# Patient Record
Sex: Male | Born: 1960 | Race: Black or African American | Hispanic: No | Marital: Single | Smoking: Former smoker
Health system: Southern US, Community
[De-identification: ages and names within clinical notes are randomized; demographics above are authoritative.]

## PROBLEM LIST (undated history)

## (undated) DIAGNOSIS — N200 Calculus of kidney: Secondary | ICD-10-CM

## (undated) DIAGNOSIS — E119 Type 2 diabetes mellitus without complications: Secondary | ICD-10-CM

## (undated) DIAGNOSIS — E785 Hyperlipidemia, unspecified: Secondary | ICD-10-CM

## (undated) DIAGNOSIS — F200 Paranoid schizophrenia: Secondary | ICD-10-CM

## (undated) DIAGNOSIS — I10 Essential (primary) hypertension: Secondary | ICD-10-CM

## (undated) DIAGNOSIS — K219 Gastro-esophageal reflux disease without esophagitis: Secondary | ICD-10-CM

## (undated) DIAGNOSIS — G4730 Sleep apnea, unspecified: Secondary | ICD-10-CM

## (undated) DIAGNOSIS — R569 Unspecified convulsions: Secondary | ICD-10-CM

---

## 2000-05-11 ENCOUNTER — Inpatient Hospital Stay (HOSPITAL_COMMUNITY): Admission: EM | Admit: 2000-05-11 | Discharge: 2000-05-19 | Payer: Self-pay | Admitting: *Deleted

## 2000-05-11 ENCOUNTER — Emergency Department (HOSPITAL_COMMUNITY): Admission: EM | Admit: 2000-05-11 | Discharge: 2000-05-11 | Payer: Self-pay | Admitting: Emergency Medicine

## 2000-05-28 ENCOUNTER — Inpatient Hospital Stay (HOSPITAL_COMMUNITY): Admission: EM | Admit: 2000-05-28 | Discharge: 2000-06-06 | Payer: Self-pay | Admitting: *Deleted

## 2001-10-25 ENCOUNTER — Emergency Department (HOSPITAL_COMMUNITY): Admission: EM | Admit: 2001-10-25 | Discharge: 2001-10-25 | Payer: Self-pay | Admitting: Emergency Medicine

## 2003-08-04 ENCOUNTER — Emergency Department (HOSPITAL_COMMUNITY): Admission: EM | Admit: 2003-08-04 | Discharge: 2003-08-04 | Payer: Self-pay | Admitting: Emergency Medicine

## 2003-09-11 ENCOUNTER — Inpatient Hospital Stay (HOSPITAL_COMMUNITY): Admission: AD | Admit: 2003-09-11 | Discharge: 2003-09-19 | Payer: Self-pay | Admitting: Psychiatry

## 2014-11-07 ENCOUNTER — Emergency Department (HOSPITAL_COMMUNITY)
Admission: EM | Admit: 2014-11-07 | Discharge: 2014-11-07 | Disposition: A | Discharge status: Home / Self Care | Payer: Medicaid Other | Attending: Emergency Medicine | Admitting: Emergency Medicine

## 2014-11-07 ENCOUNTER — Encounter (HOSPITAL_COMMUNITY): Payer: Self-pay | Admitting: *Deleted

## 2014-11-07 DIAGNOSIS — Z79899 Other long term (current) drug therapy: Secondary | ICD-10-CM | POA: Diagnosis not present

## 2014-11-07 DIAGNOSIS — Z87442 Personal history of urinary calculi: Secondary | ICD-10-CM | POA: Diagnosis not present

## 2014-11-07 DIAGNOSIS — R197 Diarrhea, unspecified: Secondary | ICD-10-CM | POA: Diagnosis not present

## 2014-11-07 DIAGNOSIS — E119 Type 2 diabetes mellitus without complications: Secondary | ICD-10-CM | POA: Insufficient documentation

## 2014-11-07 DIAGNOSIS — R7989 Other specified abnormal findings of blood chemistry: Secondary | ICD-10-CM | POA: Diagnosis present

## 2014-11-07 DIAGNOSIS — K219 Gastro-esophageal reflux disease without esophagitis: Secondary | ICD-10-CM | POA: Diagnosis not present

## 2014-11-07 DIAGNOSIS — Z720 Tobacco use: Secondary | ICD-10-CM | POA: Diagnosis not present

## 2014-11-07 DIAGNOSIS — F209 Schizophrenia, unspecified: Secondary | ICD-10-CM | POA: Insufficient documentation

## 2014-11-07 DIAGNOSIS — E785 Hyperlipidemia, unspecified: Secondary | ICD-10-CM | POA: Insufficient documentation

## 2014-11-07 DIAGNOSIS — I10 Essential (primary) hypertension: Secondary | ICD-10-CM | POA: Insufficient documentation

## 2014-11-07 DIAGNOSIS — Z880 Allergy status to penicillin: Secondary | ICD-10-CM | POA: Diagnosis not present

## 2014-11-07 HISTORY — DX: Type 2 diabetes mellitus without complications: E11.9

## 2014-11-07 HISTORY — DX: Calculus of kidney: N20.0

## 2014-11-07 HISTORY — DX: Gastro-esophageal reflux disease without esophagitis: K21.9

## 2014-11-07 HISTORY — DX: Essential (primary) hypertension: I10

## 2014-11-07 HISTORY — DX: Paranoid schizophrenia: F20.0

## 2014-11-07 HISTORY — DX: Sleep apnea, unspecified: G47.30

## 2014-11-07 HISTORY — DX: Hyperlipidemia, unspecified: E78.5

## 2014-11-07 LAB — COMPREHENSIVE METABOLIC PANEL
ALT: 21 U/L (ref 17–63)
ANION GAP: 9 (ref 5–15)
AST: 28 U/L (ref 15–41)
Albumin: 3.5 g/dL (ref 3.5–5.0)
Alkaline Phosphatase: 74 U/L (ref 38–126)
BUN: 18 mg/dL (ref 6–20)
CALCIUM: 9.2 mg/dL (ref 8.9–10.3)
CHLORIDE: 106 mmol/L (ref 101–111)
CO2: 25 mmol/L (ref 22–32)
Creatinine, Ser: 1.18 mg/dL (ref 0.61–1.24)
Glucose, Bld: 86 mg/dL (ref 65–99)
POTASSIUM: 4.4 mmol/L (ref 3.5–5.1)
Sodium: 140 mmol/L (ref 135–145)
Total Bilirubin: 0.5 mg/dL (ref 0.3–1.2)
Total Protein: 6.9 g/dL (ref 6.5–8.1)

## 2014-11-07 LAB — CBC
HEMATOCRIT: 37.7 % — AB (ref 39.0–52.0)
Hemoglobin: 12.4 g/dL — ABNORMAL LOW (ref 13.0–17.0)
MCH: 26.4 pg (ref 26.0–34.0)
MCHC: 32.9 g/dL (ref 30.0–36.0)
MCV: 80.4 fL (ref 78.0–100.0)
PLATELETS: 299 10*3/uL (ref 150–400)
RBC: 4.69 MIL/uL (ref 4.22–5.81)
RDW: 15.3 % (ref 11.5–15.5)
WBC: 10.7 10*3/uL — AB (ref 4.0–10.5)

## 2014-11-07 NOTE — ED Notes (Addendum)
Per ems pt is from [facility], pt has blood work drawn on 6/9, pt had elevated potassium 5.8. Denies any complaint. Reports chronic diarrhea and rectum irritated due to diarrhea. Denies SOB. Rhonchi lung sounds. cig smoker 1 pack/day. Denies ETOH.   Upon rn assessment. Pt reports rectal pain from diarrhea 8/10.

## 2014-11-07 NOTE — Discharge Instructions (Signed)
Your electrolytes and kidney function were normal today.  Please follow up with your family doctor regarding the diarrhea and for recheck.    Diarrhea Diarrhea is frequent loose and watery bowel movements. It can cause you to feel weak and dehydrated. Dehydration can cause you to become tired and thirsty, have a dry mouth, and have decreased urination that often is dark yellow. Diarrhea is a sign of another problem, most often an infection that will not last long. In most cases, diarrhea typically lasts 2-3 days. However, it can last longer if it is a sign of something more serious. It is important to treat your diarrhea as directed by your caregiver to lessen or prevent future episodes of diarrhea. CAUSES  Some common causes include:  Gastrointestinal infections caused by viruses, bacteria, or parasites.  Food poisoning or food allergies.  Certain medicines, such as antibiotics, chemotherapy, and laxatives.  Artificial sweeteners and fructose.  Digestive disorders. HOME CARE INSTRUCTIONS  Ensure adequate fluid intake (hydration): Have 1 cup (8 oz) of fluid for each diarrhea episode. Avoid fluids that contain simple sugars or sports drinks, fruit juices, whole milk products, and sodas. Your urine should be clear or pale yellow if you are drinking enough fluids. Hydrate with an oral rehydration solution that you can purchase at pharmacies, retail stores, and online. You can prepare an oral rehydration solution at home by mixing the following ingredients together:   - tsp table salt.   tsp baking soda.   tsp salt substitute containing potassium chloride.  1  tablespoons sugar.  1 L (34 oz) of water.  Certain foods and beverages may increase the speed at which food moves through the gastrointestinal (GI) tract. These foods and beverages should be avoided and include:  Caffeinated and alcoholic beverages.  High-fiber foods, such as raw fruits and vegetables, nuts, seeds, and whole  grain breads and cereals.  Foods and beverages sweetened with sugar alcohols, such as xylitol, sorbitol, and mannitol.  Some foods may be well tolerated and may help thicken stool including:  Starchy foods, such as rice, toast, pasta, low-sugar cereal, oatmeal, grits, baked potatoes, crackers, and bagels.  Bananas.  Applesauce.  Add probiotic-rich foods to help increase healthy bacteria in the GI tract, such as yogurt and fermented milk products.  Wash your hands well after each diarrhea episode.  Only take over-the-counter or prescription medicines as directed by your caregiver.  Take a warm bath to relieve any burning or pain from frequent diarrhea episodes. SEEK IMMEDIATE MEDICAL CARE IF:   You are unable to keep fluids down.  You have persistent vomiting.  You have blood in your stool, or your stools are black and tarry.  You do not urinate in 6-8 hours, or there is only a small amount of very dark urine.  You have abdominal pain that increases or localizes.  You have weakness, dizziness, confusion, or light-headedness.  You have a severe headache.  Your diarrhea gets worse or does not get better.  You have a fever or persistent symptoms for more than 2-3 days.  You have a fever and your symptoms suddenly get worse. MAKE SURE YOU:   Understand these instructions.  Will watch your condition.  Will get help right away if you are not doing well or get worse. Document Released: 04/30/2002 Document Revised: 09/24/2013 Document Reviewed: 01/16/2012 [facility] Patient Information 2015 [place], [place]. This information is not intended to replace advice given to you by your health care provider. Make sure you discuss any  questions you have with your health care provider. ° °

## 2014-11-07 NOTE — ED Notes (Signed)
PTAR at bedside 

## 2014-11-07 NOTE — ED Notes (Signed)
[facility] called for transport back to facility 

## 2014-11-07 NOTE — Progress Notes (Signed)
CM saw pt prior to [facility] Pt initially stated to cm that he was living "in the psych ward"  Pt residing in [place] but states he is originally from "[place]" CM discuss with pt that is he at [facility] and the dr from his facility forms states he if followed by Dr Herma Mering Pt able to repeat this to Cm  Cm explained to pt that if he left [facility] he needed to contact DSS to get assist with a medicaid pcp Cm provided pt with a list of [place] pcps and pt states he understood The contact number and address is on the info provided by Cm

## 2014-11-07 NOTE — ED Provider Notes (Signed)
CSN: [id number]     Arrival date & time 11/07/14  0910 History   First MD Initiated Contact with Patient 11/07/14 [phone]     Chief Complaint  Patient presents with  . Abnormal Lab     The history is provided by the patient and the EMS personnel. No language interpreter was used.   Ronald Roman presents for evaluation of abnormal lab. Level 5 caveat due to vague and poor historian. Per report patient is referred for evaluation of abnormal labs. He had labs drawn in June 8 that were reported on June 9. The potassium was elevated at 5.9 on those labs. His creatinine was elevated at 1.4. Patient has no current complaints but states that he vomited once yesterday and he has had some intermittent lower abdominal pain and rectal pain as well as a large amount of orange diarrhea. It is unclear how long the diarrhea has been taking place. He resides at [facility]. He has a history of paranoid schizophrenia, diabetes.  Past Medical History  Diagnosis Date  . Diabetes mellitus without complication   . Hyperlipidemia   . Hypertension   . GERD (gastroesophageal reflux disease)   . Uric acid nephrolithiasis   . Sleep apnea   . Paranoid schizophrenia    History reviewed. No pertinent past surgical history. History reviewed. No pertinent family history. History  Substance Use Topics  . Smoking status: Current Every Day Smoker -- 1.00 packs/day    Types: Cigarettes  . Smokeless tobacco: Not on file  . Alcohol Use: No    Review of Systems  All other systems reviewed and are negative.     Allergies  Cogentin; Penicillins; and Prolixin  Home Medications   Prior to Admission medications   Medication Sig Start Date End Date Taking? Authorizing Provider  albuterol (PROVENTIL HFA;VENTOLIN HFA) 108 (90 BASE) MCG/ACT inhaler Inhale 2 puffs into the lungs every 4 (four) hours as needed for shortness of breath.   Yes Historical Provider, MD  atorvastatin (LIPITOR) 40 MG tablet Take 40 mg by mouth  daily with breakfast.   Yes Historical Provider, MD  cloZAPine (CLOZARIL) 100 MG tablet Take 200 mg by mouth 2 (two) times daily.   Yes Historical Provider, MD  divalproex (DEPAKOTE) 250 MG DR tablet Take 250 mg by mouth at bedtime.   Yes Historical Provider, MD  divalproex (DEPAKOTE) 500 MG DR tablet Take 1,000 mg by mouth at bedtime.   Yes Historical Provider, MD  hydrochlorothiazide (HYDRODIURIL) 25 MG tablet Take 12.5 mg by mouth daily with breakfast.   Yes Historical Provider, MD  insulin glargine (LANTUS) 100 UNIT/ML injection Inject 30 Units into the skin 2 (two) times daily. Hold bedtime dose if BS is 100 or lower   Yes Historical Provider, MD  lisinopril (PRINIVIL,ZESTRIL) 10 MG tablet Take 20 mg by mouth daily with breakfast.   Yes Historical Provider, MD  LORazepam (ATIVAN) 0.5 MG tablet Take 0.5 mg by mouth 2 (two) times daily.   Yes Historical Provider, MD  magnesium oxide (MAG-OX) 400 MG tablet Take 400 mg by mouth 2 (two) times daily.   Yes Historical Provider, MD  metFORMIN (GLUCOPHAGE) 1000 MG tablet Take 1,000 mg by mouth 2 (two) times daily with a meal.   Yes Historical Provider, MD  metoprolol tartrate (LOPRESSOR) 25 MG tablet Take 25 mg by mouth 2 (two) times daily.   Yes Historical Provider, MD  QUEtiapine (SEROQUEL) 100 MG tablet Take 100 mg by mouth 2 (two) times daily.  Yes Historical Provider, MD  QUEtiapine (SEROQUEL) 50 MG tablet Take 75 mg by mouth 2 (two) times daily.   Yes Historical Provider, MD  ranitidine (ZANTAC) 150 MG tablet Take 150 mg by mouth daily with breakfast.   Yes Historical Provider, MD  senna-docusate (SENEXON-S) 8.6-50 MG per tablet Take 1 tablet by mouth 2 (two) times daily.   Yes Historical Provider, MD  sitaGLIPtin (JANUVIA) 100 MG tablet Take 100 mg by mouth daily with breakfast.   Yes Historical Provider, MD  tiotropium (SPIRIVA) 18 MCG inhalation capsule Place 18 mcg into inhaler and inhale daily.   Yes Historical Provider, MD  verapamil  (CALAN) 120 MG tablet Take 120 mg by mouth at bedtime.   Yes Historical Provider, MD   BP 125/82 mmHg  Pulse 84  Temp(Src) 98.5 F (36.9 C) (Oral)  Resp 16  SpO2 97% Physical Exam  Constitutional: He appears well-developed and well-nourished.  HENT:  Head: Normocephalic and atraumatic.  Cardiovascular: Normal rate and regular rhythm.   Pulmonary/Chest: Effort normal. No respiratory distress.  Abdominal: Soft. There is no tenderness. There is no rebound and no guarding.  Genitourinary:  Minimal rectal tenderness, no gross blood, no external hemorrhoids or palpable masses.  Musculoskeletal: He exhibits no edema or tenderness.  Neurological: He is alert.  Skin: Skin is warm and dry.  Psychiatric:  Flat affect  Nursing note and vitals reviewed.   ED Course  Procedures (including critical care time) Labs Review Labs Reviewed  CBC - Abnormal; Notable for the following:    WBC 10.7 (*)    Hemoglobin 12.4 (*)    HCT 37.7 (*)    All other components within normal limits  COMPREHENSIVE METABOLIC PANEL  COMPREHENSIVE METABOLIC PANEL    Imaging Review No results found.   EKG Interpretation None      MDM   Final diagnoses:  Diarrhea    Patient with ongoing diarrhea here for evaluation of hyperkalemia from a lab drawn one week ago. There is no evidence of hyperkalemia today. Abdominal examination is benign and patient is without any current complaints. Discussed continuing current medications and outpatient follow-up.    Tilden Fossa, MD 11/08/14 [phone]

## 2014-11-07 NOTE — ED Notes (Signed)
Bed: NK53 Expected date: 11/07/14 Expected time:  Means of arrival:  Comments: EMS

## 2014-11-07 NOTE — ED Notes (Signed)
md at bedside  Pt alert and oriented x4. Respirations even and unlabored, bilateral symmetrical rise and fall of chest. Skin warm and dry. In no acute distress. Denies needs.   

## 2015-02-03 ENCOUNTER — Encounter (HOSPITAL_COMMUNITY): Payer: Self-pay

## 2015-02-03 ENCOUNTER — Inpatient Hospital Stay (HOSPITAL_COMMUNITY)
Admission: EM | Admit: 2015-02-03 | Discharge: 2015-02-07 | DRG: 092 | Disposition: A | Discharge status: Home Health | Payer: Medicaid Other | Attending: Internal Medicine | Admitting: Internal Medicine

## 2015-02-03 DIAGNOSIS — G9340 Encephalopathy, unspecified: Secondary | ICD-10-CM | POA: Diagnosis present

## 2015-02-03 DIAGNOSIS — M6282 Rhabdomyolysis: Secondary | ICD-10-CM | POA: Diagnosis present

## 2015-02-03 DIAGNOSIS — T424X5A Adverse effect of benzodiazepines, initial encounter: Secondary | ICD-10-CM | POA: Diagnosis present

## 2015-02-03 DIAGNOSIS — K219 Gastro-esophageal reflux disease without esophagitis: Secondary | ICD-10-CM | POA: Diagnosis present

## 2015-02-03 DIAGNOSIS — E785 Hyperlipidemia, unspecified: Secondary | ICD-10-CM | POA: Diagnosis present

## 2015-02-03 DIAGNOSIS — R569 Unspecified convulsions: Secondary | ICD-10-CM

## 2015-02-03 DIAGNOSIS — J029 Acute pharyngitis, unspecified: Secondary | ICD-10-CM | POA: Diagnosis present

## 2015-02-03 DIAGNOSIS — F17210 Nicotine dependence, cigarettes, uncomplicated: Secondary | ICD-10-CM | POA: Diagnosis present

## 2015-02-03 DIAGNOSIS — G009 Bacterial meningitis, unspecified: Secondary | ICD-10-CM | POA: Diagnosis present

## 2015-02-03 DIAGNOSIS — D72829 Elevated white blood cell count, unspecified: Secondary | ICD-10-CM | POA: Diagnosis present

## 2015-02-03 DIAGNOSIS — T43225A Adverse effect of selective serotonin reuptake inhibitors, initial encounter: Secondary | ICD-10-CM | POA: Diagnosis present

## 2015-02-03 DIAGNOSIS — Z87442 Personal history of urinary calculi: Secondary | ICD-10-CM

## 2015-02-03 DIAGNOSIS — R40241 Glasgow coma scale score 13-15, unspecified time: Secondary | ICD-10-CM

## 2015-02-03 DIAGNOSIS — G40909 Epilepsy, unspecified, not intractable, without status epilepticus: Secondary | ICD-10-CM

## 2015-02-03 DIAGNOSIS — G4733 Obstructive sleep apnea (adult) (pediatric): Secondary | ICD-10-CM | POA: Diagnosis present

## 2015-02-03 DIAGNOSIS — I10 Essential (primary) hypertension: Secondary | ICD-10-CM | POA: Diagnosis present

## 2015-02-03 DIAGNOSIS — G92 Toxic encephalopathy: Principal | ICD-10-CM | POA: Diagnosis present

## 2015-02-03 DIAGNOSIS — Y92009 Unspecified place in unspecified non-institutional (private) residence as the place of occurrence of the external cause: Secondary | ICD-10-CM

## 2015-02-03 DIAGNOSIS — E872 Acidosis, unspecified: Secondary | ICD-10-CM

## 2015-02-03 DIAGNOSIS — Z888 Allergy status to other drugs, medicaments and biological substances status: Secondary | ICD-10-CM

## 2015-02-03 DIAGNOSIS — E1165 Type 2 diabetes mellitus with hyperglycemia: Secondary | ICD-10-CM | POA: Diagnosis present

## 2015-02-03 DIAGNOSIS — Z794 Long term (current) use of insulin: Secondary | ICD-10-CM

## 2015-02-03 DIAGNOSIS — Z880 Allergy status to penicillin: Secondary | ICD-10-CM

## 2015-02-03 DIAGNOSIS — Z720 Tobacco use: Secondary | ICD-10-CM | POA: Diagnosis present

## 2015-02-03 DIAGNOSIS — Z79899 Other long term (current) drug therapy: Secondary | ICD-10-CM

## 2015-02-03 DIAGNOSIS — G4730 Sleep apnea, unspecified: Secondary | ICD-10-CM | POA: Diagnosis present

## 2015-02-03 DIAGNOSIS — F200 Paranoid schizophrenia: Secondary | ICD-10-CM | POA: Diagnosis present

## 2015-02-03 DIAGNOSIS — E119 Type 2 diabetes mellitus without complications: Secondary | ICD-10-CM

## 2015-02-03 DIAGNOSIS — N179 Acute kidney failure, unspecified: Secondary | ICD-10-CM | POA: Diagnosis present

## 2015-02-03 MED ORDER — SODIUM CHLORIDE 0.9 % IV BOLUS (SEPSIS)
1000.0000 mL | Freq: Once | INTRAVENOUS | Status: AC
  Administered 2015-02-04: 1000 mL via INTRAVENOUS

## 2015-02-03 NOTE — ED Provider Notes (Signed)
CSN: [id number]     Arrival date & time 02/03/15  2159 History  This chart was scribed for Derwood Kaplan, MD by Tanda Rockers, ED Scribe. This patient was seen in room WA16/WA16 and the patient's care was started at 11:16 PM.   Chief Complaint  Patient presents with  . Altered Mental Status   LEVEL 5 CAVEAT for altered mental status  The history is provided by the patient. No language interpreter was used.     HPI Comments: Ronald Roman is a 54 y.o. male with h DM, HLD, HTN, paranoid schizophrenia who presents to the Emergency Department complaining of AMS. Pt does not know why he is in the hospital tonight. When asked what the last thing patient remembers is he says he "remembers little things." Pt knows what his name is but reports the year is "[birth year]." He denies EtOH or illicit drug use today. Per triage report, EMS was called on pt tonight due to independent living facility being unable to locate pt. He was found laying in the porch and was incontinence of stool and urine.   Past Medical History  Diagnosis Date  . Diabetes mellitus without complication   . Hyperlipidemia   . Hypertension   . GERD (gastroesophageal reflux disease)   . Uric acid nephrolithiasis   . Sleep apnea   . Paranoid schizophrenia    History reviewed. No pertinent past surgical history. History reviewed. No pertinent family history. Social History  Substance Use Topics  . Smoking status: Current Every Day Smoker -- 1.00 packs/day    Types: Cigarettes  . Smokeless tobacco: None  . Alcohol Use: No    Review of Systems  Unable to perform ROS: Mental status change   Allergies  Cogentin; Penicillins; and Prolixin  Home Medications   Prior to Admission medications   Medication Sig Start Date End Date Taking? Authorizing Provider  albuterol (PROVENTIL HFA;VENTOLIN HFA) 108 (90 BASE) MCG/ACT inhaler Inhale 2 puffs into the lungs every 4 (four) hours as needed for shortness of breath.    Historical  Provider, MD  atorvastatin (LIPITOR) 40 MG tablet Take 40 mg by mouth daily with breakfast.    Historical Provider, MD  cloZAPine (CLOZARIL) 100 MG tablet Take 200 mg by mouth 2 (two) times daily.    Historical Provider, MD  divalproex (DEPAKOTE) 250 MG DR tablet Take 250 mg by mouth at bedtime.    Historical Provider, MD  divalproex (DEPAKOTE) 500 MG DR tablet Take 1,000 mg by mouth at bedtime.    Historical Provider, MD  hydrochlorothiazide (HYDRODIURIL) 25 MG tablet Take 12.5 mg by mouth daily with breakfast.    Historical Provider, MD  insulin glargine (LANTUS) 100 UNIT/ML injection Inject 30 Units into the skin 2 (two) times daily. Hold bedtime dose if BS is 100 or lower    Historical Provider, MD  lisinopril (PRINIVIL,ZESTRIL) 10 MG tablet Take 20 mg by mouth daily with breakfast.    Historical Provider, MD  LORazepam (ATIVAN) 0.5 MG tablet Take 0.5 mg by mouth 2 (two) times daily.    Historical Provider, MD  magnesium oxide (MAG-OX) 400 MG tablet Take 400 mg by mouth 2 (two) times daily.    Historical Provider, MD  metFORMIN (GLUCOPHAGE) 1000 MG tablet Take 1,000 mg by mouth 2 (two) times daily with a meal.    Historical Provider, MD  metoprolol tartrate (LOPRESSOR) 25 MG tablet Take 25 mg by mouth 2 (two) times daily.    Historical Provider, MD  QUEtiapine (  SEROQUEL) 100 MG tablet Take 100 mg by mouth 2 (two) times daily.    Historical Provider, MD  QUEtiapine (SEROQUEL) 50 MG tablet Take 75 mg by mouth 2 (two) times daily.    Historical Provider, MD  ranitidine (ZANTAC) 150 MG tablet Take 150 mg by mouth daily with breakfast.    Historical Provider, MD  senna-docusate (SENEXON-S) 8.6-50 MG per tablet Take 1 tablet by mouth 2 (two) times daily.    Historical Provider, MD  sitaGLIPtin (JANUVIA) 100 MG tablet Take 100 mg by mouth daily with breakfast.    Historical Provider, MD  tiotropium (SPIRIVA) 18 MCG inhalation capsule Place 18 mcg into inhaler and inhale daily.    Historical Provider,  MD  verapamil (CALAN) 120 MG tablet Take 120 mg by mouth at bedtime.    Historical Provider, MD   Triage Vitals: BP 113/57 mmHg  Pulse 105  Temp(Src) 98.3 F (36.8 C) (Oral)  Resp 18  SpO2 95%   Physical Exam  Constitutional: He appears well-developed and well-nourished. No distress.  HENT:  Head: Normocephalic and atraumatic.  Eyes: Conjunctivae and EOM are normal.  Pupils are 2 mm. Reactive to light.   Neck: Neck supple. No tracheal deviation present.  Cardiovascular: Regular rhythm.  Tachycardia present.   Pulmonary/Chest: Effort normal. No respiratory distress.  Abdominal: Soft. There is no tenderness.  Musculoskeletal: Normal range of motion.  Neurological: He is alert.  Pt following simple commands CN I-XII intact Moving all extremities   Skin: Skin is warm and dry.  Nursing note and vitals reviewed.   ED Course  LUMBAR PUNCTURE Date/Time: 02/04/2015 6:10 AM Performed by: Derwood Kaplan Authorized by: Derwood Kaplan Consent: The procedure was performed in an emergent situation. Verbal consent obtained. Risks and benefits: risks, benefits and alternatives were discussed Consent given by: patient (case manager) Patient understanding: patient states understanding of the procedure being performed Site marked: the operative site was marked Patient identity confirmed: arm band Indications: evaluation for infection and evaluation for altered mental status Anesthesia: local infiltration Local anesthetic: lidocaine 1% with epinephrine Anesthetic total: 3 ml Patient sedated: no Lumbar space: L4-L5 interspace Patient's position: sitting Needle gauge: 20 Needle type: diamond point Fluid appearance: clear Tubes of fluid: 4 Total volume: 10 ml Post-procedure: site cleaned and adhesive bandage applied Patient tolerance: Patient tolerated the procedure well with no immediate complications   (including critical care time)    DIAGNOSTIC STUDIES: Oxygen Saturation is  95% on RA, normal by my interpretation.    COORDINATION OF CARE: 11:42 PM-Treatment plan includes CT Head, Lactic acid, CBC, CMP, Troponin, Magnesium, Phosphorous, Rapid drug screen, ETOH, Valproic acid, Ammonia    Labs Review Labs Reviewed  CBC WITH DIFFERENTIAL/PLATELET - Abnormal; Notable for the following:    WBC 18.2 (*)    HCT 38.8 (*)    Neutro Abs 13.0 (*)    Monocytes Absolute 1.3 (*)    All other components within normal limits  COMPREHENSIVE METABOLIC PANEL - Abnormal; Notable for the following:    CO2 19 (*)    Glucose, Bld 227 (*)    BUN 38 (*)    Creatinine, Ser 1.80 (*)    AST 54 (*)    GFR calc non Af Amer 41 (*)    GFR calc Af Amer 48 (*)    All other components within normal limits  MAGNESIUM - Abnormal; Notable for the following:    Magnesium 1.3 (*)    All other components within normal limits  PHOSPHORUS -  Abnormal; Notable for the following:    Phosphorus 4.9 (*)    All other components within normal limits  GLUCOSE, CSF - Abnormal; Notable for the following:    Glucose, CSF 149 (*)    All other components within normal limits  I-STAT CG4 LACTIC ACID, ED - Abnormal; Notable for the following:    Lactic Acid, Venous 5.00 (*)    All other components within normal limits  I-STAT CG4 LACTIC ACID, ED - Abnormal; Notable for the following:    Lactic Acid, Venous 3.62 (*)    All other components within normal limits  CULTURE, BLOOD (ROUTINE X 2)  CULTURE, BLOOD (ROUTINE X 2)  URINE CULTURE  CSF CULTURE  GRAM STAIN  TROPONIN I  URINE RAPID DRUG SCREEN, HOSP PERFORMED  ETHANOL  VALPROIC ACID LEVEL  AMMONIA  URINALYSIS, ROUTINE W REFLEX MICROSCOPIC (NOT AT [facility])  PROTEIN, CSF  CSF CELL COUNT WITH DIFFERENTIAL  CSF CELL COUNT WITH DIFFERENTIAL  CK    Imaging Review Ct Head Wo Contrast  02/04/2015   CLINICAL DATA:  54 year old male with altered mental status  EXAM: CT HEAD WITHOUT CONTRAST  TECHNIQUE: Contiguous axial images were obtained from the  base of the skull through the vertex without intravenous contrast.  COMPARISON:  CT dated 03/06/2012  FINDINGS: The ventricles and the sulci are appropriate in size for the patient's age. There is no intracranial hemorrhage. No midline shift or mass effect identified. The gray-white matter differentiation is preserved.  The visualized paranasal sinuses and mastoid air cells are well aerated. The calvarium is intact. There is soft tissue thickening and scarring of the posterior scalp similar prior study.  IMPRESSION: No acute intracranial pathology.   Electronically Signed   By: Elgie Collard M.D.   On: 02/04/2015 00:58   I have personally reviewed and evaluated these images and lab results as part of my medical decision-making.   EKG Interpretation   Date/Time:  Monday February 03 2015 22:10:48 EDT Ventricular Rate:  106 PR Interval:  166 QRS Duration: 85 QT Interval:  337 QTC Calculation: 447 R Axis:   81 Text Interpretation:  Sinus tachycardia ED PHYSICIAN INTERPRETATION  AVAILABLE IN [facility] Confirmed by TEST, Record ([id number]) on  02/04/2015 7:54:33 AM      MDM  Spoke with case manager who came to see him at 9 PM. She reports that pt had gone missing for an entire day and was relocated earlier on Monday. He was with the 7 PM staff who noted that pt was laughing and answering questions but otherwise appearing stable. When she arrived at 9 PM pt was laying on floor of porch. He had defecated on self and was no responding to any stimuli. She called EMS concerned about his psychotropic and diabetes medications. At baseline pt is disorganized but complaint with medications and responds appropriately. I was given permission to get further workup in ER if necessary, including lumbar puncture. He has a lactate of 5, WBC of 18, and HR between 90-110 on arrival. Exam is still non focal for source of infection. Sepsis workup and broad spectrum antibiotics started. CT Scan of the head is normal.  Pt is still not giving any meaningful history and we might have to get a lumbar puncture on him. Differential diagnoses is undifferentiated sepsis vs medication withdrawal. He is not in DKA.    Final diagnoses:  Encephalopathy acute  Glasgow coma scale total score 13-15  Leukocytosis  Lactic acidosis    I personally  performed the services described in this documentation, which was scribed in my presence. The recorded information has been reviewed and is accurate.  DDx includes: ICH Stroke Sepsis syndrome Infection - UTI/Pneumonia Meningitis/encephalitis Electrolyte abnormality Drug overdose DKA Metabolic disorders including thyroid disorders, adrenal insufficiency Acute anemia Cancer of unknown origin Hypercapnia Seizures  Pt comes in with cc of AMS. He is not able to provide any meaningful hx. He is confused. Observed over extended period of time - and he doesn't appear to be at baseline. Spoke with the Case manager - at baseline pt is compliant with meds, responds to query appropriately.  He has elevated WC, lactate. Lactate inproved with fluids. No meningismus, but with AMS, elevated WC and lactate - we decided to get LP. Admitting to medicine.     Derwood Kaplan, MD 02/04/15 [phone]

## 2015-02-03 NOTE — ED Notes (Signed)
Bed: AO13 Expected date:  Expected time:  Means of arrival:  Comments: EMS/34M/hyperglycemia/incontinence

## 2015-02-03 NOTE — ED Notes (Signed)
Pt is under care of [facility] for schizoaffective disorder.  Case worker called EMS due to inability to locate patient this evening.  EMS reports patient was found laying on porch of independent living home and was incontinent of stool and urine.  Pt obeying commands and moving all extremities without difficulty but will not answer questions.

## 2015-02-04 ENCOUNTER — Emergency Department (HOSPITAL_COMMUNITY): Payer: Medicaid Other

## 2015-02-04 ENCOUNTER — Inpatient Hospital Stay (HOSPITAL_COMMUNITY)
Admit: 2015-02-04 | Discharge: 2015-02-04 | Disposition: A | Discharge status: Home / Self Care | Payer: Medicaid Other | Attending: Internal Medicine | Admitting: Internal Medicine

## 2015-02-04 ENCOUNTER — Encounter (HOSPITAL_COMMUNITY): Payer: Self-pay | Admitting: Internal Medicine

## 2015-02-04 DIAGNOSIS — Z720 Tobacco use: Secondary | ICD-10-CM | POA: Diagnosis present

## 2015-02-04 DIAGNOSIS — I10 Essential (primary) hypertension: Secondary | ICD-10-CM | POA: Diagnosis present

## 2015-02-04 DIAGNOSIS — E872 Acidosis: Secondary | ICD-10-CM | POA: Diagnosis present

## 2015-02-04 DIAGNOSIS — F200 Paranoid schizophrenia: Secondary | ICD-10-CM | POA: Diagnosis present

## 2015-02-04 DIAGNOSIS — Y92009 Unspecified place in unspecified non-institutional (private) residence as the place of occurrence of the external cause: Secondary | ICD-10-CM | POA: Diagnosis not present

## 2015-02-04 DIAGNOSIS — N179 Acute kidney failure, unspecified: Secondary | ICD-10-CM | POA: Diagnosis present

## 2015-02-04 DIAGNOSIS — G40909 Epilepsy, unspecified, not intractable, without status epilepticus: Secondary | ICD-10-CM | POA: Diagnosis not present

## 2015-02-04 DIAGNOSIS — E785 Hyperlipidemia, unspecified: Secondary | ICD-10-CM | POA: Diagnosis present

## 2015-02-04 DIAGNOSIS — Z87442 Personal history of urinary calculi: Secondary | ICD-10-CM | POA: Diagnosis not present

## 2015-02-04 DIAGNOSIS — E119 Type 2 diabetes mellitus without complications: Secondary | ICD-10-CM

## 2015-02-04 DIAGNOSIS — M6282 Rhabdomyolysis: Secondary | ICD-10-CM | POA: Diagnosis present

## 2015-02-04 DIAGNOSIS — G4733 Obstructive sleep apnea (adult) (pediatric): Secondary | ICD-10-CM | POA: Diagnosis present

## 2015-02-04 DIAGNOSIS — G9340 Encephalopathy, unspecified: Secondary | ICD-10-CM | POA: Diagnosis present

## 2015-02-04 DIAGNOSIS — K219 Gastro-esophageal reflux disease without esophagitis: Secondary | ICD-10-CM | POA: Diagnosis present

## 2015-02-04 DIAGNOSIS — Z794 Long term (current) use of insulin: Secondary | ICD-10-CM | POA: Diagnosis not present

## 2015-02-04 DIAGNOSIS — T424X5A Adverse effect of benzodiazepines, initial encounter: Secondary | ICD-10-CM | POA: Diagnosis present

## 2015-02-04 DIAGNOSIS — E1165 Type 2 diabetes mellitus with hyperglycemia: Secondary | ICD-10-CM | POA: Diagnosis present

## 2015-02-04 DIAGNOSIS — G4730 Sleep apnea, unspecified: Secondary | ICD-10-CM | POA: Diagnosis present

## 2015-02-04 DIAGNOSIS — Z888 Allergy status to other drugs, medicaments and biological substances status: Secondary | ICD-10-CM | POA: Diagnosis not present

## 2015-02-04 DIAGNOSIS — G92 Toxic encephalopathy: Secondary | ICD-10-CM | POA: Diagnosis not present

## 2015-02-04 DIAGNOSIS — J029 Acute pharyngitis, unspecified: Secondary | ICD-10-CM | POA: Diagnosis present

## 2015-02-04 DIAGNOSIS — D72829 Elevated white blood cell count, unspecified: Secondary | ICD-10-CM | POA: Diagnosis present

## 2015-02-04 DIAGNOSIS — F17210 Nicotine dependence, cigarettes, uncomplicated: Secondary | ICD-10-CM | POA: Diagnosis present

## 2015-02-04 DIAGNOSIS — Z79899 Other long term (current) drug therapy: Secondary | ICD-10-CM | POA: Diagnosis not present

## 2015-02-04 DIAGNOSIS — Z880 Allergy status to penicillin: Secondary | ICD-10-CM | POA: Diagnosis not present

## 2015-02-04 DIAGNOSIS — R4182 Altered mental status, unspecified: Secondary | ICD-10-CM | POA: Diagnosis not present

## 2015-02-04 DIAGNOSIS — T43225A Adverse effect of selective serotonin reuptake inhibitors, initial encounter: Secondary | ICD-10-CM | POA: Diagnosis present

## 2015-02-04 DIAGNOSIS — G009 Bacterial meningitis, unspecified: Secondary | ICD-10-CM | POA: Diagnosis not present

## 2015-02-04 LAB — CBC WITH DIFFERENTIAL/PLATELET
Basophils Absolute: 0 10*3/uL (ref 0.0–0.1)
Basophils Relative: 0 % (ref 0–1)
EOS PCT: 0 % (ref 0–5)
Eosinophils Absolute: 0.1 10*3/uL (ref 0.0–0.7)
HCT: 38.8 % — ABNORMAL LOW (ref 39.0–52.0)
HEMOGLOBIN: 13.1 g/dL (ref 13.0–17.0)
LYMPHS ABS: 4 10*3/uL (ref 0.7–4.0)
Lymphocytes Relative: 22 % (ref 12–46)
MCH: 27.4 pg (ref 26.0–34.0)
MCHC: 33.8 g/dL (ref 30.0–36.0)
MCV: 81.2 fL (ref 78.0–100.0)
Monocytes Absolute: 1.3 10*3/uL — ABNORMAL HIGH (ref 0.1–1.0)
Monocytes Relative: 7 % (ref 3–12)
NEUTROS PCT: 71 % (ref 43–77)
Neutro Abs: 13 10*3/uL — ABNORMAL HIGH (ref 1.7–7.7)
Platelets: 257 10*3/uL (ref 150–400)
RBC: 4.78 MIL/uL (ref 4.22–5.81)
RDW: 15.1 % (ref 11.5–15.5)
WBC: 18.2 10*3/uL — ABNORMAL HIGH (ref 4.0–10.5)

## 2015-02-04 LAB — RAPID URINE DRUG SCREEN, HOSP PERFORMED
Amphetamines: NOT DETECTED
Barbiturates: NOT DETECTED
Benzodiazepines: NOT DETECTED
Cocaine: NOT DETECTED
Opiates: NOT DETECTED
Tetrahydrocannabinol: NOT DETECTED

## 2015-02-04 LAB — PHOSPHORUS: PHOSPHORUS: 4.9 mg/dL — AB (ref 2.5–4.6)

## 2015-02-04 LAB — CSF CELL COUNT WITH DIFFERENTIAL
RBC COUNT CSF: 2 /mm3 — AB
RBC Count, CSF: 9 /mm3 — ABNORMAL HIGH
TUBE #: 1
Tube #: 4
WBC CSF: 3 /mm3 (ref 0–5)
WBC, CSF: 2 /mm3 (ref 0–5)

## 2015-02-04 LAB — GLUCOSE, CAPILLARY
GLUCOSE-CAPILLARY: 196 mg/dL — AB (ref 65–99)
GLUCOSE-CAPILLARY: 125 mg/dL — AB (ref 65–99)
Glucose-Capillary: 111 mg/dL — ABNORMAL HIGH (ref 65–99)

## 2015-02-04 LAB — COMPREHENSIVE METABOLIC PANEL
ALK PHOS: 73 U/L (ref 38–126)
ALT: 31 U/L (ref 17–63)
AST: 54 U/L — ABNORMAL HIGH (ref 15–41)
Albumin: 3.7 g/dL (ref 3.5–5.0)
Anion gap: 14 (ref 5–15)
BUN: 38 mg/dL — ABNORMAL HIGH (ref 6–20)
CALCIUM: 9 mg/dL (ref 8.9–10.3)
CO2: 19 mmol/L — ABNORMAL LOW (ref 22–32)
Chloride: 103 mmol/L (ref 101–111)
Creatinine, Ser: 1.8 mg/dL — ABNORMAL HIGH (ref 0.61–1.24)
GFR calc non Af Amer: 41 mL/min — ABNORMAL LOW (ref >60)
GFR, EST AFRICAN AMERICAN: 48 mL/min — AB (ref >60)
Glucose, Bld: 227 mg/dL — ABNORMAL HIGH (ref 65–99)
POTASSIUM: 4.4 mmol/L (ref 3.5–5.1)
Sodium: 136 mmol/L (ref 135–145)
TOTAL PROTEIN: 7.3 g/dL (ref 6.5–8.1)
Total Bilirubin: 0.6 mg/dL (ref 0.3–1.2)

## 2015-02-04 LAB — CK: Total CK: 2223 U/L — ABNORMAL HIGH (ref 49–397)

## 2015-02-04 LAB — PROTEIN, CSF: Total  Protein, CSF: 34 mg/dL (ref 15–45)

## 2015-02-04 LAB — MAGNESIUM: Magnesium: 1.3 mg/dL — ABNORMAL LOW (ref 1.7–2.4)

## 2015-02-04 LAB — URINALYSIS, ROUTINE W REFLEX MICROSCOPIC
BILIRUBIN URINE: NEGATIVE
Glucose, UA: NEGATIVE mg/dL
HGB URINE DIPSTICK: NEGATIVE
Ketones, ur: NEGATIVE mg/dL
Leukocytes, UA: NEGATIVE
NITRITE: NEGATIVE
PROTEIN: NEGATIVE mg/dL
Specific Gravity, Urine: 1.019 (ref 1.005–1.030)
Urobilinogen, UA: 0.2 mg/dL (ref 0.0–1.0)
pH: 5 (ref 5.0–8.0)

## 2015-02-04 LAB — GLUCOSE, CSF: GLUCOSE CSF: 149 mg/dL — AB (ref 40–70)

## 2015-02-04 LAB — AMMONIA: AMMONIA: 34 umol/L (ref 9–35)

## 2015-02-04 LAB — TROPONIN I: Troponin I: <0.03 ng/mL (ref <0.031)

## 2015-02-04 LAB — I-STAT CG4 LACTIC ACID, ED
LACTIC ACID, VENOUS: 5 mmol/L — AB (ref 0.5–2.0)
Lactic Acid, Venous: 3.62 mmol/L (ref 0.5–2.0)

## 2015-02-04 LAB — LACTIC ACID, PLASMA
Lactic Acid, Venous: 1.3 mmol/L (ref 0.5–2.0)
Lactic Acid, Venous: 2.4 mmol/L (ref 0.5–2.0)

## 2015-02-04 LAB — CBG MONITORING, ED: Glucose-Capillary: 321 mg/dL — ABNORMAL HIGH (ref 65–99)

## 2015-02-04 LAB — ETHANOL

## 2015-02-04 LAB — VALPROIC ACID LEVEL: VALPROIC ACID LVL: 67 ug/mL (ref 50.0–100.0)

## 2015-02-04 LAB — TSH: TSH: 1.633 u[IU]/mL (ref 0.350–4.500)

## 2015-02-04 MED ORDER — ACETAMINOPHEN 325 MG PO TABS: 650.0000 mg | ORAL_TABLET | Freq: Four times a day (QID) | ORAL | Status: DC | PRN

## 2015-02-04 MED ORDER — SODIUM CHLORIDE 0.9 % IV BOLUS (SEPSIS)
1000.0000 mL | INTRAVENOUS | Status: AC
  Administered 2015-02-04 (×3): 1000 mL via INTRAVENOUS

## 2015-02-04 MED ORDER — SODIUM CHLORIDE 0.9 % IJ SOLN
3.0000 mL | Freq: Two times a day (BID) | INTRAMUSCULAR | Status: DC
  Administered 2015-02-05 (×2): 3 mL via INTRAVENOUS

## 2015-02-04 MED ORDER — SULFAMETHOXAZOLE-TRIMETHOPRIM 400-80 MG/5ML IV SOLN
448.0000 mg | Freq: Four times a day (QID) | INTRAVENOUS | Status: DC
  Administered 2015-02-04 – 2015-02-05 (×5): 448 mg via INTRAVENOUS
  Filled 2015-02-04 (×5): qty 28

## 2015-02-04 MED ORDER — ALBUTEROL SULFATE (2.5 MG/3ML) 0.083% IN NEBU: 2.5000 mg | INHALATION_SOLUTION | RESPIRATORY_TRACT | Status: DC | PRN

## 2015-02-04 MED ORDER — SODIUM CHLORIDE 0.9 % IV SOLN
INTRAVENOUS | Status: AC
  Administered 2015-02-04 (×2): via INTRAVENOUS

## 2015-02-04 MED ORDER — ACETAMINOPHEN 650 MG RE SUPP: 650.0000 mg | Freq: Four times a day (QID) | RECTAL | Status: DC | PRN

## 2015-02-04 MED ORDER — DEXTROSE 5 % IV SOLN
2.0000 g | Freq: Once | INTRAVENOUS | Status: AC
  Administered 2015-02-04: 2 g via INTRAVENOUS
  Filled 2015-02-04: qty 2

## 2015-02-04 MED ORDER — SODIUM CHLORIDE 0.9 % IV BOLUS (SEPSIS)
500.0000 mL | Freq: Once | INTRAVENOUS | Status: AC
  Administered 2015-02-04: 500 mL via INTRAVENOUS

## 2015-02-04 MED ORDER — DEXTROSE 5 % IV SOLN
2.0000 g | Freq: Two times a day (BID) | INTRAVENOUS | Status: DC
  Administered 2015-02-04 – 2015-02-06 (×4): 2 g via INTRAVENOUS
  Filled 2015-02-04 (×5): qty 2

## 2015-02-04 MED ORDER — ENOXAPARIN SODIUM 40 MG/0.4ML ~~LOC~~ SOLN
40.0000 mg | SUBCUTANEOUS | Status: DC
  Administered 2015-02-04 – 2015-02-07 (×4): 40 mg via SUBCUTANEOUS
  Filled 2015-02-04 (×4): qty 0.4

## 2015-02-04 MED ORDER — DIVALPROEX SODIUM 500 MG PO DR TAB
1250.0000 mg | DELAYED_RELEASE_TABLET | Freq: Every day | ORAL | Status: DC
  Administered 2015-02-04 – 2015-02-06 (×3): 1250 mg via ORAL
  Filled 2015-02-04 (×4): qty 1

## 2015-02-04 MED ORDER — INSULIN ASPART 100 UNIT/ML ~~LOC~~ SOLN: 0.0000 [IU] | Freq: Every day | SUBCUTANEOUS | Status: DC

## 2015-02-04 MED ORDER — DIVALPROEX SODIUM 500 MG PO DR TAB
1000.0000 mg | DELAYED_RELEASE_TABLET | Freq: Every day | ORAL | Status: DC
  Filled 2015-02-04: qty 2

## 2015-02-04 MED ORDER — CHLORHEXIDINE GLUCONATE 0.12 % MT SOLN
15.0000 mL | Freq: Two times a day (BID) | OROMUCOSAL | Status: DC
  Administered 2015-02-04 – 2015-02-07 (×7): 15 mL via OROMUCOSAL
  Filled 2015-02-04 (×8): qty 15

## 2015-02-04 MED ORDER — VANCOMYCIN HCL IN DEXTROSE 1-5 GM/200ML-% IV SOLN
1000.0000 mg | Freq: Once | INTRAVENOUS | Status: AC
  Administered 2015-02-04: 1000 mg via INTRAVENOUS
  Filled 2015-02-04: qty 200

## 2015-02-04 MED ORDER — INSULIN ASPART 100 UNIT/ML ~~LOC~~ SOLN
0.0000 [IU] | Freq: Three times a day (TID) | SUBCUTANEOUS | Status: DC
  Administered 2015-02-04: 7 [IU] via SUBCUTANEOUS
  Administered 2015-02-04: 2 [IU] via SUBCUTANEOUS
  Administered 2015-02-05: 3 [IU] via SUBCUTANEOUS
  Administered 2015-02-06 – 2015-02-07 (×4): 1 [IU] via SUBCUTANEOUS
  Filled 2015-02-04: qty 1

## 2015-02-04 MED ORDER — VANCOMYCIN HCL 10 G IV SOLR
1250.0000 mg | INTRAVENOUS | Status: DC
  Administered 2015-02-05: 1250 mg via INTRAVENOUS
  Filled 2015-02-04: qty 1250

## 2015-02-04 MED ORDER — SULFAMETHOXAZOLE-TRIMETHOPRIM 400-80 MG/5ML IV SOLN
448.0000 mg | Freq: Once | INTRAVENOUS | Status: AC
  Administered 2015-02-04: 448 mg via INTRAVENOUS
  Filled 2015-02-04: qty 28

## 2015-02-04 MED ORDER — SODIUM CHLORIDE 0.9 % IV SOLN: 1.0000 g | Freq: Once | INTRAVENOUS | Status: DC

## 2015-02-04 MED ORDER — VANCOMYCIN HCL 10 G IV SOLR
1500.0000 mg | INTRAVENOUS | Status: AC
  Administered 2015-02-04: 1500 mg via INTRAVENOUS
  Filled 2015-02-04: qty 1500

## 2015-02-04 MED ORDER — MAGNESIUM SULFATE 2 GM/50ML IV SOLN
2.0000 g | Freq: Once | INTRAVENOUS | Status: AC
  Administered 2015-02-04: 2 g via INTRAVENOUS
  Filled 2015-02-04: qty 50

## 2015-02-04 MED ORDER — LORAZEPAM 2 MG/ML IJ SOLN: 1.0000 mg | Freq: Four times a day (QID) | INTRAMUSCULAR | Status: DC | PRN

## 2015-02-04 MED ORDER — DIVALPROEX SODIUM 250 MG PO DR TAB
250.0000 mg | DELAYED_RELEASE_TABLET | Freq: Every day | ORAL | Status: DC
  Filled 2015-02-04: qty 1

## 2015-02-04 MED ORDER — LORAZEPAM 0.5 MG PO TABS
0.5000 mg | ORAL_TABLET | Freq: Two times a day (BID) | ORAL | Status: DC
  Administered 2015-02-04 – 2015-02-06 (×4): 0.5 mg via ORAL
  Filled 2015-02-04 (×4): qty 1

## 2015-02-04 NOTE — Procedures (Signed)
EEG report.  Brief clinical history:  54 y.o. male , single, resident of? Group home, PMH of DM 2, HTN, HLD, GERD, sleep apnea, paranoid schizophrenia, possible seizure disorder, presented to the [facility] ED on 02/03/15 with altered mental status that has been gradually improving.   Technique: this is a 17 channel routine scalp EEG performed at the bedside with bipolar and monopolar montages arranged in accordance to the international 10/20 system of electrode placement. One channel was dedicated to EKG recording.  The study was performed during wakefulness, drowsiness, and stage 2 sleep. No activating procedures.  Description: patient is asleep for the most part of the study, but n the wakeful state the best background consisted of a medium amplitude, posterior dominant, poorly sustained, symmetric and reactive 10 Hz rhythm. Drowsiness demonstrated dropout of the alpha rhythm. Stage 2 sleep showed symmetric and synchronous sleep spindles without intermixed epileptiform discharges. No focal or generalized epileptiform discharges noted.  No pathologic areas of slowing seen.  EKG showed sinus rhythm.  Impression: this is a normal awake and asleep EEG. Please, be aware that a normal EEG does not exclude the possibility of epilepsy.  Clinical correlation is advised.   Wyatt Portela, MD [facility]

## 2015-02-04 NOTE — Progress Notes (Addendum)
The home med list is now updated to the best of our ability.   I spoke with social worker Zacarias Pontes at [facility], [place] [address]. [address], [place], [name] [address]. [phone], [phone]. She faxed a list of meds that the patient is prescribed. The patient lives alone and does not know what he takes. He has a "bag of meds" and tries to take them as printed on the bottles. He is unable to tell me what pharmacy he uses.   Charolotte Eke, PharmD, pager [phone]. 02/04/2015,1:36 PM.  Addendum: He thinks he uses [facility] or [facility] pharmacy but [facility] has no records on him and [facility] has a Clozaril prescription that has never been picked up but nothing else.

## 2015-02-04 NOTE — Care Management Note (Signed)
Case Management Note  Patient Details  Name: Ronald Roman MRN: [id number] Date of Birth: 1961/03/06  Subjective/Objective:54 y/o m admitted w/Acute encephalopathy. From group home.                    Action/Plan:d/c plan return to group home.   Expected Discharge Date:   (unknown)               Expected Discharge Plan:  Group Home  In-House Referral:     Discharge planning Services  CM Consult  Post Acute Care Choice:    Choice offered to:     DME Arranged:    DME Agency:     HH Arranged:    HH Agency:     Status of Service:  In process, will continue to follow  Medicare Important Message Given:    Date Medicare IM Given:    Medicare IM give by:    Date Additional Medicare IM Given:    Additional Medicare Important Message give by:     If discussed at Long Length of Stay Meetings, dates discussed:    Additional Comments:  Lanier Clam, RN 02/04/2015, 2:51 PM

## 2015-02-04 NOTE — ED Notes (Signed)
hospitalist at bedside

## 2015-02-04 NOTE — H&P (Signed)
History and Physical  KABE MCKOY ZOX:[id number] DOB: 1961/03/03 DOA: 02/03/2015  Referring physician: Dr. Derwood Kaplan, EDP PCP: Vinnie Langton, NP  Outpatient Specialists:  1. Not known  Chief Complaint: Altered MS.  HPI: Ronald Roman is a 54 y.o. male , single, resident of? Group home, PMH of DM 2, HTN, HLD, GERD, sleep apnea, paranoid schizophrenia, possible seizure disorder, presented to the [facility] ED on 02/03/15 with altered mental status. He initially was not able to provide much history. As per EDP, he did not know why he was in the hospital. As per report, the residential facility was unable to locate patient for an entire day. After he was relocated on Monday at approximately 7 PM, he was inappropriately laughing and answering questions but otherwise appeared stable. At approximately 9 PM, he was found lying on the floor of the porch, had defecated on self and was not responding to any stimuli. At baseline, patient is disorganized but compliant with medications and responds appropriately. EMS was called. In the ED, sepsis protocol was initiated, lumbar puncture was performed and patient was treated empirically with IV antibiotics (vancomycin, Rocephin and Bactrim) for meningitis. Subsequently this morning, patient is more alert and responds to questions. He states that he has history of "convulsions" but has no recollection of events that brought him to the hospital. He currently only complains of sore throat but denies any other complaints. He denies headache, earache, neck pain, chest pain, cough, dyspnea, abdominal pain, constipation, nausea, vomiting, diarrhea, dysuria or urinary frequency. In the ED, vital signs stable, WBC 18, creatinine 1.8, glucose 227, ammonia normal, lactate elevated, CT head without acute findings and chest x-ray without acute findings. Hospitalist admission was requested for further evaluation and management.    Review of Systems: All  systems reviewed and apart from history of presenting illness, are negative.  Past Medical History  Diagnosis Date  . Diabetes mellitus without complication   . Hyperlipidemia   . Hypertension   . GERD (gastroesophageal reflux disease)   . Uric acid nephrolithiasis   . Sleep apnea   . Paranoid schizophrenia    History reviewed. No pertinent past surgical history. Social History:  reports that he has been smoking Cigarettes.  He has been smoking about 1.00 pack per day. He does not have any smokeless tobacco history on file. He reports that he does not drink alcohol or use illicit drugs.  rest as per history of presenting illness.   Allergies  Allergen Reactions  . Cogentin [Benztropine]     Per MAR   . Penicillins     Per MAR   . Prolixin [Fluphenazine]     Per MAR     History reviewed. No pertinent family history.  patient denies family history.   Prior to Admission medications   Medication Sig Start Date End Date Taking? Authorizing Provider  albuterol (PROVENTIL HFA;VENTOLIN HFA) 108 (90 BASE) MCG/ACT inhaler Inhale 2 puffs into the lungs every 4 (four) hours as needed for shortness of breath.    Historical Provider, MD  atorvastatin (LIPITOR) 40 MG tablet Take 40 mg by mouth daily with breakfast.    Historical Provider, MD  cloZAPine (CLOZARIL) 100 MG tablet Take 200 mg by mouth 2 (two) times daily.    Historical Provider, MD  divalproex (DEPAKOTE) 250 MG DR tablet Take 250 mg by mouth at bedtime.    Historical Provider, MD  divalproex (DEPAKOTE) 500 MG DR tablet Take 1,000 mg by mouth at  bedtime.    Historical Provider, MD  hydrochlorothiazide (HYDRODIURIL) 25 MG tablet Take 12.5 mg by mouth daily with breakfast.    Historical Provider, MD  insulin glargine (LANTUS) 100 UNIT/ML injection Inject 30 Units into the skin 2 (two) times daily. Hold bedtime dose if BS is 100 or lower    Historical Provider, MD  lisinopril (PRINIVIL,ZESTRIL) 10 MG tablet Take 20 mg by mouth daily  with breakfast.    Historical Provider, MD  LORazepam (ATIVAN) 0.5 MG tablet Take 0.5 mg by mouth 2 (two) times daily.    Historical Provider, MD  magnesium oxide (MAG-OX) 400 MG tablet Take 400 mg by mouth 2 (two) times daily.    Historical Provider, MD  metFORMIN (GLUCOPHAGE) 1000 MG tablet Take 1,000 mg by mouth 2 (two) times daily with a meal.    Historical Provider, MD  metoprolol tartrate (LOPRESSOR) 25 MG tablet Take 25 mg by mouth 2 (two) times daily.    Historical Provider, MD  QUEtiapine (SEROQUEL) 100 MG tablet Take 100 mg by mouth 2 (two) times daily.    Historical Provider, MD  QUEtiapine (SEROQUEL) 50 MG tablet Take 75 mg by mouth 2 (two) times daily.    Historical Provider, MD  ranitidine (ZANTAC) 150 MG tablet Take 150 mg by mouth daily with breakfast.    Historical Provider, MD  senna-docusate (SENEXON-S) 8.6-50 MG per tablet Take 1 tablet by mouth 2 (two) times daily.    Historical Provider, MD  sitaGLIPtin (JANUVIA) 100 MG tablet Take 100 mg by mouth daily with breakfast.    Historical Provider, MD  tiotropium (SPIRIVA) 18 MCG inhalation capsule Place 18 mcg into inhaler and inhale daily.    Historical Provider, MD  verapamil (CALAN) 120 MG tablet Take 120 mg by mouth at bedtime.    Historical Provider, MD   Physical Exam: Filed Vitals:   02/04/15 0700 02/04/15 0720 02/04/15 0740 02/04/15 0800  BP: 133/73 120/76 132/79 116/72  Pulse: 82 80 74 76  Temp:      TempSrc:      Resp: Height:      Weight:    107.956 kg (238 lb)  SpO2: 97%   99%   temperature 98.75F.    General exam: Moderately built and obese male patient, lying comfortably supine on the gurney in no obvious distress.  Head, eyes and ENT: Nontraumatic and normocephalic. Pupils equally reacting to light and accommodation. Oral mucosa moist. no evidence of tongue bite.   Neck: Supple. No JVD, carotid bruit or thyromegaly.  Lymphatics: No lymphadenopathy.  Respiratory system: Clear to  auscultation. No increased work of breathing.  Cardiovascular system: S1 and S2 heard, RRR. No JVD, murmurs, gallops, clicks or pedal edema.  Gastrointestinal system: Abdomen is nondistended, soft and nontender. Normal bowel sounds heard. No organomegaly or masses appreciated.  Central nervous system: slightly somnolent but easily arousable and oriented to person and place. No focal neurological deficits. follows instructions appropriately.   Extremities: Symmetric 5 x 5 power. Peripheral pulses symmetrically felt.   Skin: No rashes or acute findings.  Musculoskeletal system: Negative exam.  Psychiatry: Pleasant and cooperative.   Labs on Admission:  Basic Metabolic Panel:  Recent Labs Lab 02/04/15 0109  NA 136  K 4.4  CL 103  CO2 19*  GLUCOSE 227*  BUN 38*  CREATININE 1.80*  CALCIUM 9.0  MG 1.3*  PHOS 4.9*   Liver Function Tests:  Recent Labs Lab 02/04/15 0109  AST 54*  ALT 31  ALKPHOS 73  BILITOT 0.6  PROT 7.3  ALBUMIN 3.7   No results for input(s): LIPASE, AMYLASE in the last 168 hours.  Recent Labs Lab 02/04/15 0109  AMMONIA 34   CBC:  Recent Labs Lab 02/04/15 0109  WBC 18.2*  NEUTROABS 13.0*  HGB 13.1  HCT 38.8*  MCV 81.2  PLT 257   Cardiac Enzymes:  Recent Labs Lab 02/04/15 0109  TROPONINI <0.03    BNP (last 3 results) No results for input(s): PROBNP in the last 8760 hours. CBG:  Recent Labs Lab 02/04/15 0819  GLUCAP 321*    Radiological Exams on Admission: Ct Head Wo Contrast  02/04/2015   CLINICAL DATA:  54 year old male with altered mental status  EXAM: CT HEAD WITHOUT CONTRAST  TECHNIQUE: Contiguous axial images were obtained from the base of the skull through the vertex without intravenous contrast.  COMPARISON:  CT dated 03/06/2012  FINDINGS: The ventricles and the sulci are appropriate in size for the patient's age. There is no intracranial hemorrhage. No midline shift or mass effect identified. The gray-white matter  differentiation is preserved.  The visualized paranasal sinuses and mastoid air cells are well aerated. The calvarium is intact. There is soft tissue thickening and scarring of the posterior scalp similar prior study.  IMPRESSION: No acute intracranial pathology.   Electronically Signed   By: Elgie Collard M.D.   On: 02/04/2015 00:58   Dg Chest Portable 1 View  02/04/2015   CLINICAL DATA:  Sepsis, altered mental status.  EXAM: PORTABLE CHEST - 1 VIEW  COMPARISON:  01/12/2013  FINDINGS: The cardiomediastinal contours are normal. There is bronchial thickening. Minimal linear atelectasis at the right lung base. No consolidation, pleural effusion, or pneumothorax. No acute osseous abnormalities are seen.  IMPRESSION: Bronchial thickening, may be chronic or represent acute bronchitis. Minimal linear atelectasis at the right lung base.   Electronically Signed   By: Rubye Oaks M.D.   On: 02/04/2015 03:11    EKG: Independently reviewed. sinus tachycardia at 106 bpm, normal axis and no acute changes. QTC 447 ms.   Assessment/Plan Principal Problem:   Encephalopathy acute Active Problems:   Diabetes mellitus without complication   Hyperlipidemia   Hypertension   GERD (gastroesophageal reflux disease)   Sleep apnea   Paranoid schizophrenia   AKI (acute kidney injury)   Tobacco abuse   Seizure disorder   Acute encephalopathy   Acute encephalopathy  - Suspicious for seizures and postictal state. Low index of suspicion for acute meningoencephalitis.  - CT head negative for acute findings. Ammonia normal. UDS negative. BAL negative. UDS negative. Based on chest x-ray and urine microscopy-no source of infection  - Status post lumbar puncture by EDP: Follow results. For now continue empiric IV vancomycin, Rocephin and Bactrim to cover for meningitis. If cultures negative, DC antibiotics and monitor.  - Monitor for seizures, seizure precautions and check EEG.  - Monitor closely. Mental status  improving.   Acute kidney injury/non-anion gap metabolic acidosis - May be related to dehydration and meds (ARB/ACEI) - IV fluids and follow BMP. Hold nephrotoxic medications.  Seizure disorder - Patient gives history of "convulsions". Need further history from residential place including his home medications (pharmacy to perform home med rec) - Valproate level: Low normal indicating compliance. - Monitor for seizures, seizure precautions and resume meds after home med rec completed. - Check EEG. - When necessary IV Ativan for seizures. - Based on duration of seizure disorder and prior workup, may  consider further evaluation including MRI brain and neurology consultation.  Uncontrolled type II DM - Check A1c. Start on NovoLog SSI.  Essential hypertension - Controlled. Hold ARB/ACEI. When necessary IV hydralazine.  Elevated lactate  - May be related to seizure and less likely from sepsis. Hydrated aggressively with IV fluids. Monitor lactate.   Tobacco abuse - Cessation counseled  Paranoid schizophrenia  - Verify home medications then may need to resume some of them. May need to consider psychiatric consultation.   OSA  - Denies CPAP   Leukocytosis - Likely stress response. Follow CBCs.   DVT prophylaxis: Lovenox  Code Status:full Family Communication: None at bedside   Disposition Plan: DC home when medically stable    Time spent: 70 minutes  Kerrin Markman, MD, FACP, FHM. [facility] Pager [phone]  If 7PM-7AM, please contact night-coverage [email or web address] Password [facility] 02/04/2015, 8:32 AM

## 2015-02-04 NOTE — Progress Notes (Signed)
ANTIBIOTIC CONSULT NOTE - INITIAL  Pharmacy Consult for vancomycin/Septra/ceftriaxone  Indication: meningitis  Allergies  Allergen Reactions  . Cogentin [Benztropine]     Per MAR   . Penicillins     Per MAR   . Prolixin [Fluphenazine]     Per [facility]     Patient Measurements: Height: 6' (182.9 cm) Weight: 238 lb (107.956 kg) IBW/kg (Calculated) : 77.6 Adjusted Body Weight: 89.8 kg  Vital Signs: Temp: 98.9 F (37.2 C) (09/13 0529) Temp Source: Oral (09/13 0529) BP: 116/72 mmHg (09/13 0800) Pulse Rate: 76 (09/13 0800) Intake/Output from previous day: 09/12 0701 - 09/13 0700 In: 1000 [IV Piggyback:1000] Out: -  Intake/Output from this shift:    Labs:  Recent Labs  02/04/15 0109  WBC 18.2*  HGB 13.1  PLT 257  CREATININE 1.80*   Estimated Creatinine Clearance: 59.6 mL/min (by C-G formula based on Cr of 1.8). No results for input(s): VANCOTROUGH, VANCOPEAK, VANCORANDOM, GENTTROUGH, GENTPEAK, GENTRANDOM, TOBRATROUGH, TOBRAPEAK, TOBRARND, AMIKACINPEAK, AMIKACINTROU, AMIKACIN in the last 72 hours.   Microbiology: No results found for this or any previous visit (from the past 720 hour(s)).  Medical History: Past Medical History  Diagnosis Date  . Diabetes mellitus without complication   . Hyperlipidemia   . Hypertension   . GERD (gastroesophageal reflux disease)   . Uric acid nephrolithiasis   . Sleep apnea   . Paranoid schizophrenia     Medications:  Anti-infectives    Start     Dose/Rate Route Frequency Ordered Stop   02/05/15 1000  vancomycin (VANCOCIN) 1,250 mg in sodium chloride 0.9 % 250 mL IVPB     1,250 mg 166.7 mL/hr over 90 Minutes Intravenous Every 24 hours 02/04/15 0828     02/04/15 1500  cefTRIAXone (ROCEPHIN) 2 g in dextrose 5 % 50 mL IVPB     2 g 100 mL/hr over 30 Minutes Intravenous Every 12 hours 02/04/15 0828     02/04/15 1200  sulfamethoxazole-trimethoprim (BACTRIM) 448 mg of trimethoprim in dextrose 5 % 500 mL IVPB     448 mg of  trimethoprim 352 mL/hr over 90 Minutes Intravenous 4 times per day 02/04/15 0828     02/04/15 0830  vancomycin (VANCOCIN) 1,500 mg in sodium chloride 0.9 % 500 mL IVPB     1,500 mg 250 mL/hr over 120 Minutes Intravenous STAT 02/04/15 0823 02/05/15 0830   02/04/15 0330  sulfamethoxazole-trimethoprim (BACTRIM) 448 mg of trimethoprim in dextrose 5 % 500 mL IVPB     448 mg of trimethoprim 352 mL/hr over 90 Minutes Intravenous  Once 02/04/15 0318 02/04/15 0741   02/04/15 0300  ampicillin (OMNIPEN) 1 g in sodium chloride 0.9 % 50 mL IVPB  Status:  Discontinued     1 g 150 mL/hr over 20 Minutes Intravenous  Once 02/04/15 0245 02/04/15 0318   02/04/15 0245  cefTRIAXone (ROCEPHIN) 2 g in dextrose 5 % 50 mL IVPB     2 g 100 mL/hr over 30 Minutes Intravenous  Once 02/04/15 0245 02/04/15 0655   02/04/15 0230  vancomycin (VANCOCIN) IVPB 1000 mg/200 mL premix     1,000 mg 200 mL/hr over 60 Minutes Intravenous  Once 02/04/15 0220 02/04/15 0531     Assessment: 54 yoM with hx schizoaffective disorder found on porch of Independent living home incontinent of stool and urine with altered mental status and acute encephalopathy. Ceftriaxone/Vancomycin/Septra for meningitis in pt with PCN allergy rxn unknown. (called case worker >allergy info not in computer and pt is unable to confirm  allergy). Original weight was an estimation of 100 kg. Upon confirmation with ER RN, pt weight is 108 kg.    Goal of Therapy:  Vancomycin trough level 15-20 mcg/ml    Plan:  Measure antibiotic drug levels at steady state  Pt received vancomycin 1000 mg and will order additional 1500 mg IV for a total loading dose of 2500 mg. Will continue vancomycin 1250 mg IV q24h. Ceftriaxone 2 gr IV q12h.  Septra 448 mg of TMP IV q6h.    Adalberto Cole PharmD, BCPS 02/04/15 08:35

## 2015-02-04 NOTE — ED Notes (Signed)
MD Nanavati notified about elevated lactic acid

## 2015-02-04 NOTE — Progress Notes (Addendum)
Addendum  Pharmacy has attempted to find out what medications he truly is on at home without success. CK >2000 suggesting mild rhabdomyolysis and this may be in keeping with presumed seizure activity that brought him into the hospital. As discussed with neurology, will resume Depakote and scheduled Ativan. Psychiatric consulted to assist with Psych med management. Hold ACEI/HCTZ secondary to acute kidney injury. Hold oral hypoglycemics. Blood pressure is currently controlled off meds-may consider resuming metoprolol if starts to increase. Statins being held secondary to rhabdomyolysis.  EEG: No seizure  Cheyne Boulden, MD, FACP, FHM. [facility] Pager [phone]  If 7PM-7AM, please contact night-coverage [email or web address] Password [email or web address] 02/04/2015, 3:10 PM

## 2015-02-04 NOTE — Consult Note (Addendum)
NEURO HOSPITALIST CONSULT NOTE   Referring physician: Dr Dellia Nims Reason for Consult: seizures  HPI:                                                                                                                                          Ronald Roman is an 54 y.o. male with a past medical history that is relevant for HTN, hyperlipidemia, DM, OSA, paranoid schizophrenia, GERD, and possible seizure disorder, admitted to [facility] due to altered mental status with particular concern for seizure. Patient seems to be a poor historian and also said that he has not recollection of what happened before being admitted to the hospital, " but I think that I had a seizure". Therefore, all clinical information is was obtained from patient medical record " possible seizure disorder, presented to the [facility] ED on 02/03/15 with altered mental status. He initially was not able to provide much history. As per EDP, he did not know why he was in the hospital. As per report, the residential facility was unable to locate patient for an entire day. After he was relocated on Monday at approximately 7 PM, he was inappropriately laughing and answering questions but otherwise appeared stable. At approximately 9 PM, he was found lying on the floor of the porch, had defecated on self and was not responding to any stimuli. At baseline, patient is disorganized but compliant with medications and responds appropriately. EMS was called. In the ED, sepsis protocol was initiated, lumbar puncture was performed and patient was treated empirically with IV antibiotics (vancomycin, Rocephin and Bactrim) for meningitis. Subsequently this morning, patient is more alert and responds to questions. He states that he has history of "convulsions" but has no recollection of events that brought him to the hospital. He currently only complains of sore throat but denies any other complaints. He denies headache, earache, neck  pain, chest pain, cough, dyspnea, abdominal pain, constipation, nausea, vomiting, diarrhea, dysuria or urinary frequency. In the ED, vital signs stable, WBC 18, creatinine 1.8, glucose 227, ammonia normal, lactate elevated, CT head without acute findings and chest x-ray without acute findings. Hospitalist admission was requested for further evaluation and management".  Ronald Roman denies HA, vertigo, double vision, focal weakness or numbness, slurred speech, imbalance, language or vision impairment. He doesn't know about other family members with epilepsy, but denies CNS infections, severe head trauma, or stroke. He indicated that his " convulsions occur every couple of months". Denies warnings before his convulsions. Patient is taking Depakote which he said was prescribed for seizures, and thinks that he took phenobarbital some time in the past. Further serologies include VPA level therapeutic at 67. CSF without evidence of infection. CK>2223, ETOH < 5.  EEG today is normal. Past Medical History  Diagnosis  Date  . Diabetes mellitus without complication   . Hyperlipidemia   . Hypertension   . GERD (gastroesophageal reflux disease)   . Uric acid nephrolithiasis   . Sleep apnea   . Paranoid schizophrenia     History reviewed. No pertinent past surgical history.  History reviewed. No pertinent family history.  Family History: unclear.   Social History:  reports that he has been smoking Cigarettes.  He has been smoking about 1.00 pack per day. He does not have any smokeless tobacco history on file. He reports that he does not drink alcohol or use illicit drugs.  Allergies  Allergen Reactions  . Cogentin [Benztropine]     Per MAR   . Penicillins     Per MAR   . Prolixin [Fluphenazine]     Per MAR     MEDICATIONS:                                                                                                                     Scheduled: . cefTRIAXone (ROCEPHIN)  IV  2 g  Intravenous Q12H  . chlorhexidine  15 mL Mouth/Throat BID  . enoxaparin (LOVENOX) injection  40 mg Subcutaneous Q24H  . insulin aspart  0-5 Units Subcutaneous QHS  . insulin aspart  0-9 Units Subcutaneous TID WC  . sodium chloride  3 mL Intravenous Q12H  . sulfamethoxazole-trimethoprim  448 mg of trimethoprim Intravenous 4 times per day  . [START ON 02/05/2015] vancomycin  1,250 mg Intravenous Q24H     ROS:                                                                                                                                       History obtained from chart review and the patient  General ROS: negative for - chills, fatigue, fever, night sweats, or weight loss Psychological ROS: negative for - memory difficulties, mood swings or suicidal ideation Ophthalmic ROS: negative for - blurry vision, double vision, eye pain or loss of vision ENT ROS: negative for - epistaxis, nasal discharge, oral lesions, sore throat, tinnitus or vertigo Allergy and Immunology ROS: negative for - hives or itchy/watery eyes Hematological and Lymphatic ROS: negative for - bleeding problems, bruising or swollen lymph nodes Endocrine ROS: negative for - galactorrhea, hair pattern changes, polydipsia/polyuria or temperature intolerance Respiratory ROS: negative for - cough, hemoptysis, shortness of breath or wheezing Cardiovascular ROS: negative for -  chest pain, dyspnea on exertion, edema or irregular heartbeat Gastrointestinal ROS: negative for - abdominal pain, diarrhea, hematemesis, nausea/vomiting or stool incontinence Genito-Urinary ROS: negative for - dysuria, hematuria, incontinence or urinary frequency/urgency Musculoskeletal ROS: negative for - joint swelling or muscular weakness Neurological ROS: as noted in HPI Dermatological ROS: negative for rash and skin lesion changes   Physical exam:  Constitutional: obese, pleasant male in no apparent distress. Blood pressure 115/67, pulse 74, temperature  98.4 F (36.9 C), temperature source Oral, resp. rate 20, height 6' (1.829 m), weight 108.4 kg (238 lb 15.7 oz), SpO2 100 %. Eyes: no jaundice or exophthalmos.  Head: normocephalic. Neck: supple, no bruits, no JVD. Cardiac: no murmurs. Lungs: clear. Abdomen: soft, no tender, no mass. Extremities: no edema, clubbing, or cyanosis.  Skin: no rash  Neurologic Examination:                                                                                                      General: Mental Status: Alert, oriented, thought content appropriate.  Speech fluent without evidence of aphasia.  Able to follow 3 step commands without difficulty. Cranial Nerves: II: Discs flat bilaterally; Visual fields grossly normal, pupils equal, round, reactive to light and accommodation III,IV, VI: ptosis not present, extra-ocular motions intact bilaterally V,VII: smile symmetric, facial light touch sensation normal bilaterally VIII: hearing normal bilaterally IX,X: uvula rises symmetrically XI: bilateral shoulder shrug XII: midline tongue extension without atrophy or fasciculations  Motor: Right : Upper extremity   5/5    Left:     Upper extremity   5/5  Lower extremity   5/5     Lower extremity   5/5 Tone and bulk:normal tone throughout; no atrophy noted Sensory: Pinprick and light touch intact throughout, bilaterally Deep Tendon Reflexes:  1 all over Plantars: Right: downgoing   Left: downgoing Cerebellar: normal finger-to-nose,  normal heel-to-shin test Gait:  Unable to test due to multiple leads, safety reasons    No results found for: CHOL  Results for orders placed or performed during the hospital encounter of 02/03/15 (from the past 48 hour(s))  Valproic acid level     Status: None   Collection Time: 02/04/15 12:12 AM  Result Value Ref Range   Valproic Acid Lvl 67 50.0 - 100.0 ug/mL  Urine rapid drug screen (hosp performed)     Status: None   Collection Time: 02/04/15 12:59 AM  Result  Value Ref Range   Opiates NONE DETECTED NONE DETECTED   Cocaine NONE DETECTED NONE DETECTED   Benzodiazepines NONE DETECTED NONE DETECTED   Amphetamines NONE DETECTED NONE DETECTED   Tetrahydrocannabinol NONE DETECTED NONE DETECTED   Barbiturates NONE DETECTED NONE DETECTED    Comment:        DRUG SCREEN FOR MEDICAL PURPOSES ONLY.  IF CONFIRMATION IS NEEDED FOR ANY PURPOSE, NOTIFY LAB WITHIN 5 DAYS.        LOWEST DETECTABLE LIMITS FOR URINE DRUG SCREEN Drug Class       Cutoff (ng/mL) Amphetamine      1000 Barbiturate      200  Benzodiazepine   428 Tricyclics       768 Opiates          300 Cocaine          300 THC              50   Urinalysis, Routine w reflex microscopic (not at [facility])     Status: None   Collection Time: 02/04/15 12:59 AM  Result Value Ref Range   Color, Urine YELLOW YELLOW   APPearance CLEAR CLEAR   Specific Gravity, Urine 1.019 1.005 - 1.030   pH 5.0 5.0 - 8.0   Glucose, UA NEGATIVE NEGATIVE mg/dL   Hgb urine dipstick NEGATIVE NEGATIVE   Bilirubin Urine NEGATIVE NEGATIVE   Ketones, ur NEGATIVE NEGATIVE mg/dL   Protein, ur NEGATIVE NEGATIVE mg/dL   Urobilinogen, UA 0.2 0.0 - 1.0 mg/dL   Nitrite NEGATIVE NEGATIVE   Leukocytes, UA NEGATIVE NEGATIVE    Comment: MICROSCOPIC NOT DONE ON URINES WITH NEGATIVE PROTEIN, BLOOD, LEUKOCYTES, NITRITE, OR GLUCOSE <1000 mg/dL.  CBC with Differential/Platelet     Status: Abnormal   Collection Time: 02/04/15  1:09 AM  Result Value Ref Range   WBC 18.2 (H) 4.0 - 10.5 K/uL   RBC 4.78 4.22 - 5.81 MIL/uL   Hemoglobin 13.1 13.0 - 17.0 g/dL   HCT 38.8 (L) 39.0 - 52.0 %   MCV 81.2 78.0 - 100.0 fL   MCH 27.4 26.0 - 34.0 pg   MCHC 33.8 30.0 - 36.0 g/dL   RDW 15.1 11.5 - 15.5 %   Platelets 257 150 - 400 K/uL   Neutrophils Relative % 71 43 - 77 %   Neutro Abs 13.0 (H) 1.7 - 7.7 K/uL   Lymphocytes Relative 22 12 - 46 %   Lymphs Abs 4.0 0.7 - 4.0 K/uL   Monocytes Relative 7 3 - 12 %   Monocytes Absolute 1.3 (H) 0.1 - 1.0  K/uL   Eosinophils Relative 0 0 - 5 %   Eosinophils Absolute 0.1 0.0 - 0.7 K/uL   Basophils Relative 0 0 - 1 %   Basophils Absolute 0.0 0.0 - 0.1 K/uL  Comprehensive metabolic panel     Status: Abnormal   Collection Time: 02/04/15  1:09 AM  Result Value Ref Range   Sodium 136 135 - 145 mmol/L   Potassium 4.4 3.5 - 5.1 mmol/L   Chloride 103 101 - 111 mmol/L   CO2 19 (L) 22 - 32 mmol/L   Glucose, Bld 227 (H) 65 - 99 mg/dL   BUN 38 (H) 6 - 20 mg/dL   Creatinine, Ser 1.80 (H) 0.61 - 1.24 mg/dL   Calcium 9.0 8.9 - 10.3 mg/dL   Total Protein 7.3 6.5 - 8.1 g/dL   Albumin 3.7 3.5 - 5.0 g/dL   AST 54 (H) 15 - 41 U/L   ALT 31 17 - 63 U/L   Alkaline Phosphatase 73 38 - 126 U/L   Total Bilirubin 0.6 0.3 - 1.2 mg/dL   GFR calc non Af Amer 41 (L) >60 mL/min   GFR calc Af Amer 48 (L) >60 mL/min    Comment: (NOTE) The eGFR has been calculated using the CKD EPI equation. This calculation has not been validated in all clinical situations. eGFR's persistently <60 mL/min signify possible Chronic Kidney Disease.    Anion gap 14 5 - 15  Troponin I     Status: None   Collection Time: 02/04/15  1:09 AM  Result Value Ref Range  Troponin I <0.03 <0.031 ng/mL    Comment:        NO INDICATION OF MYOCARDIAL INJURY.   Magnesium     Status: Abnormal   Collection Time: 02/04/15  1:09 AM  Result Value Ref Range   Magnesium 1.3 (L) 1.7 - 2.4 mg/dL  Phosphorus     Status: Abnormal   Collection Time: 02/04/15  1:09 AM  Result Value Ref Range   Phosphorus 4.9 (H) 2.5 - 4.6 mg/dL  Ethanol     Status: None   Collection Time: 02/04/15  1:09 AM  Result Value Ref Range   Alcohol, Ethyl (B) <5 <5 mg/dL    Comment:        LOWEST DETECTABLE LIMIT FOR SERUM ALCOHOL IS 5 mg/dL FOR MEDICAL PURPOSES ONLY   Ammonia     Status: None   Collection Time: 02/04/15  1:09 AM  Result Value Ref Range   Ammonia 34 9 - 35 umol/L  CK     Status: Abnormal   Collection Time: 02/04/15  1:09 AM  Result Value Ref Range    Total CK 2223 (H) 49 - 397 U/L  TSH     Status: None   Collection Time: 02/04/15  1:09 AM  Result Value Ref Range   TSH 1.633 0.350 - 4.500 uIU/mL  I-Stat CG4 Lactic Acid, ED     Status: Abnormal   Collection Time: 02/04/15  1:18 AM  Result Value Ref Range   Lactic Acid, Venous 5.00 (HH) 0.5 - 2.0 mmol/L  I-Stat CG4 Lactic Acid, ED     Status: Abnormal   Collection Time: 02/04/15  3:01 AM  Result Value Ref Range   Lactic Acid, Venous 3.62 (HH) 0.5 - 2.0 mmol/L   Comment NOTIFIED PHYSICIAN   CSF cell count with differential collection tube #: 1     Status: Abnormal   Collection Time: 02/04/15  6:55 AM  Result Value Ref Range   Tube # 1    Color, CSF COLORLESS COLORLESS   Appearance, CSF CLEAR (A) CLEAR   Supernatant NOT INDICATED    RBC Count, CSF 9 (H) 0 /cu mm   WBC, CSF 2 0 - 5 /cu mm   Other Cells, CSF TOO FEW TO COUNT, SMEAR AVAILABLE FOR REVIEW     Comment: RARE LYMPHOCYTES AND MONOCYTES SEEN.   CSF cell count with differential collection tube #: 4     Status: Abnormal   Collection Time: 02/04/15  6:55 AM  Result Value Ref Range   Tube # 4    Color, CSF COLORLESS COLORLESS   Appearance, CSF CLEAR (A) CLEAR   Supernatant NOT INDICATED    RBC Count, CSF 2 (H) 0 /cu mm   WBC, CSF 3 0 - 5 /cu mm   Other Cells, CSF TOO FEW TO COUNT, SMEAR AVAILABLE FOR REVIEW     Comment: RARE LYMPHOCYTES AND MONOCYTES.  CSF culture     Status: None (Preliminary result)   Collection Time: 02/04/15  6:55 AM  Result Value Ref Range   Specimen Description CSF    Special Requests Immunocompromised    Gram Stain      WBC PRESENT, PREDOMINANTLY MONONUCLEAR NO ORGANISMS SEEN Gram Stain Report Called to,Read Back By and Verified With: HASZ,S. RN '@[id number]'  ON 9.13.16 BY MCCOY,N.    Culture PENDING    Report Status PENDING   Glucose, CSF     Status: Abnormal   Collection Time: 02/04/15  6:55 AM  Result Value Ref  Range   Glucose, CSF 149 (H) 40 - 70 mg/dL  Protein, CSF     Status: None    Collection Time: 02/04/15  6:55 AM  Result Value Ref Range   Total  Protein, CSF 34 15 - 45 mg/dL  CBG monitoring, ED     Status: Abnormal   Collection Time: 02/04/15  8:19 AM  Result Value Ref Range   Glucose-Capillary 321 (H) 65 - 99 mg/dL   Comment 1 Notify RN    Comment 2 Document in Chart   Glucose, capillary     Status: Abnormal   Collection Time: 02/04/15  1:13 PM  Result Value Ref Range   Glucose-Capillary 196 (H) 65 - 99 mg/dL   Comment 1 Notify RN     Ct Head Wo Contrast  02/04/2015   CLINICAL DATA:  54 year old male with altered mental status  EXAM: CT HEAD WITHOUT CONTRAST  TECHNIQUE: Contiguous axial images were obtained from the base of the skull through the vertex without intravenous contrast.  COMPARISON:  CT dated 03/06/2012  FINDINGS: The ventricles and the sulci are appropriate in size for the patient's age. There is no intracranial hemorrhage. No midline shift or mass effect identified. The gray-white matter differentiation is preserved.  The visualized paranasal sinuses and mastoid air cells are well aerated. The calvarium is intact. There is soft tissue thickening and scarring of the posterior scalp similar prior study.  IMPRESSION: No acute intracranial pathology.   Electronically Signed   By: Anner Crete M.D.   On: 02/04/2015 00:58   Dg Chest Portable 1 View  02/04/2015   CLINICAL DATA:  Sepsis, altered mental status.  EXAM: PORTABLE CHEST - 1 VIEW  COMPARISON:  01/12/2013  FINDINGS: The cardiomediastinal contours are normal. There is bronchial thickening. Minimal linear atelectasis at the right lung base. No consolidation, pleural effusion, or pneumothorax. No acute osseous abnormalities are seen.  IMPRESSION: Bronchial thickening, may be chronic or represent acute bronchitis. Minimal linear atelectasis at the right lung base.   Electronically Signed   By: Jeb Levering M.D.   On: 02/04/2015 03:11   Assessment/Plan: 54 y/o with HTN, hyperlipidemia, DM, OSA,  paranoid schizophrenia, GERD, and possible seizure disorder, admitted to [facility] due to altered mental status with particular concern for post ictal encephalopathy. Has a non focal neuro-exam and seems to be back to baseline. Prior seizure work up in unknown and patient can not reliably provide information regarding his seizure history. EEG today is normal. Recommend: 1) MRI brain to complete seizure work up. 2) Continue current Depakote dose pending getting further seizure history. 3) CSF doesn't look infectious, thus consider D/C antibiotics. 4) D/C ativan Will follow up after MRI.   Dorian Pod, MD 02/04/2015, 2:40 PM

## 2015-02-04 NOTE — ED Notes (Signed)
Per night shift nurse, pt received 3,054ml NS. Now on bag 4 and 5, both going 64ml/ hr.

## 2015-02-04 NOTE — ED Notes (Signed)
GAVE MD THE LACTIC ACID.

## 2015-02-04 NOTE — Progress Notes (Signed)
OS EEG completed at [facility]. Results pending.

## 2015-02-04 NOTE — Progress Notes (Signed)
CRITICAL VALUE ALERT  Critical value received:  Lactic Acid 2.4  Date of notification:  02/04/15  Time of notification:  2027  Critical value read back:Yes.    Nurse who received alert:  Lucillie Garfinkel  MD notified (1st page):  Craige Cotta NP  Time of first page:  2030  MD notified (2nd page):  Time of second page:  Responding MD:  Craige Cotta   Time MD responded:  2036

## 2015-02-05 DIAGNOSIS — E119 Type 2 diabetes mellitus without complications: Secondary | ICD-10-CM

## 2015-02-05 DIAGNOSIS — G4730 Sleep apnea, unspecified: Secondary | ICD-10-CM | POA: Diagnosis present

## 2015-02-05 DIAGNOSIS — G009 Bacterial meningitis, unspecified: Secondary | ICD-10-CM

## 2015-02-05 DIAGNOSIS — N179 Acute kidney failure, unspecified: Secondary | ICD-10-CM

## 2015-02-05 DIAGNOSIS — G40909 Epilepsy, unspecified, not intractable, without status epilepticus: Secondary | ICD-10-CM

## 2015-02-05 DIAGNOSIS — G9340 Encephalopathy, unspecified: Secondary | ICD-10-CM

## 2015-02-05 DIAGNOSIS — E785 Hyperlipidemia, unspecified: Secondary | ICD-10-CM

## 2015-02-05 DIAGNOSIS — I10 Essential (primary) hypertension: Secondary | ICD-10-CM

## 2015-02-05 DIAGNOSIS — F200 Paranoid schizophrenia: Secondary | ICD-10-CM

## 2015-02-05 LAB — COMPREHENSIVE METABOLIC PANEL
ALBUMIN: 3.1 g/dL — AB (ref 3.5–5.0)
ALT: 24 U/L (ref 17–63)
ANION GAP: 9 (ref 5–15)
AST: 28 U/L (ref 15–41)
Alkaline Phosphatase: 55 U/L (ref 38–126)
BILIRUBIN TOTAL: 0.5 mg/dL (ref 0.3–1.2)
BUN: 13 mg/dL (ref 6–20)
CO2: 18 mmol/L — ABNORMAL LOW (ref 22–32)
Calcium: 7.8 mg/dL — ABNORMAL LOW (ref 8.9–10.3)
Chloride: 113 mmol/L — ABNORMAL HIGH (ref 101–111)
Creatinine, Ser: 1.13 mg/dL (ref 0.61–1.24)
GFR calc Af Amer: >60 mL/min (ref >60)
GLUCOSE: 165 mg/dL — AB (ref 65–99)
POTASSIUM: 4.1 mmol/L (ref 3.5–5.1)
Sodium: 140 mmol/L (ref 135–145)
TOTAL PROTEIN: 6 g/dL — AB (ref 6.5–8.1)

## 2015-02-05 LAB — CBC
HEMATOCRIT: 34.1 % — AB (ref 39.0–52.0)
HEMOGLOBIN: 11.1 g/dL — AB (ref 13.0–17.0)
MCH: 26.6 pg (ref 26.0–34.0)
MCHC: 32.6 g/dL (ref 30.0–36.0)
MCV: 81.8 fL (ref 78.0–100.0)
Platelets: 244 10*3/uL (ref 150–400)
RBC: 4.17 MIL/uL — ABNORMAL LOW (ref 4.22–5.81)
RDW: 15.4 % (ref 11.5–15.5)
WBC: 9.5 10*3/uL (ref 4.0–10.5)

## 2015-02-05 LAB — GLUCOSE, CAPILLARY
GLUCOSE-CAPILLARY: 229 mg/dL — AB (ref 65–99)
Glucose-Capillary: 103 mg/dL — ABNORMAL HIGH (ref 65–99)
Glucose-Capillary: 116 mg/dL — ABNORMAL HIGH (ref 65–99)
Glucose-Capillary: 129 mg/dL — ABNORMAL HIGH (ref 65–99)

## 2015-02-05 LAB — URINE CULTURE: CULTURE: NO GROWTH

## 2015-02-05 LAB — HEMOGLOBIN A1C
Hgb A1c MFr Bld: 7.2 % — ABNORMAL HIGH (ref 4.8–5.6)
Mean Plasma Glucose: 160 mg/dL

## 2015-02-05 LAB — LACTIC ACID, PLASMA: LACTIC ACID, VENOUS: 1.8 mmol/L (ref 0.5–2.0)

## 2015-02-05 MED ORDER — QUETIAPINE FUMARATE 100 MG PO TABS
100.0000 mg | ORAL_TABLET | Freq: Two times a day (BID) | ORAL | Status: DC
  Administered 2015-02-05 – 2015-02-06 (×3): 100 mg via ORAL
  Filled 2015-02-05 (×4): qty 1

## 2015-02-05 MED ORDER — METOPROLOL TARTRATE 25 MG PO TABS
25.0000 mg | ORAL_TABLET | Freq: Two times a day (BID) | ORAL | Status: DC
  Administered 2015-02-05 – 2015-02-07 (×5): 25 mg via ORAL
  Filled 2015-02-05 (×6): qty 1

## 2015-02-05 MED ORDER — TIOTROPIUM BROMIDE MONOHYDRATE 18 MCG IN CAPS
18.0000 ug | ORAL_CAPSULE | Freq: Every day | RESPIRATORY_TRACT | Status: DC
  Administered 2015-02-05 – 2015-02-07 (×3): 18 ug via RESPIRATORY_TRACT
  Filled 2015-02-05: qty 5

## 2015-02-05 MED ORDER — QUETIAPINE FUMARATE 50 MG PO TABS
75.0000 mg | ORAL_TABLET | Freq: Two times a day (BID) | ORAL | Status: DC
  Administered 2015-02-05 – 2015-02-06 (×3): 75 mg via ORAL
  Filled 2015-02-05 (×4): qty 1

## 2015-02-05 MED ORDER — ATORVASTATIN CALCIUM 40 MG PO TABS
40.0000 mg | ORAL_TABLET | Freq: Every day | ORAL | Status: DC
  Administered 2015-02-06 – 2015-02-07 (×2): 40 mg via ORAL
  Filled 2015-02-05 (×4): qty 1

## 2015-02-05 MED ORDER — CLOZAPINE 100 MG PO TABS
200.0000 mg | ORAL_TABLET | Freq: Two times a day (BID) | ORAL | Status: DC
  Administered 2015-02-05 – 2015-02-06 (×2): 200 mg via ORAL
  Filled 2015-02-05 (×3): qty 2

## 2015-02-05 MED ORDER — LINAGLIPTIN 5 MG PO TABS
5.0000 mg | ORAL_TABLET | Freq: Every day | ORAL | Status: DC
Start: 2015-02-05 — End: 2015-02-07
  Administered 2015-02-05 – 2015-02-07 (×3): 5 mg via ORAL
  Filled 2015-02-05 (×3): qty 1

## 2015-02-05 MED ORDER — SODIUM CHLORIDE 0.9 % IV SOLN
INTRAVENOUS | Status: AC
  Administered 2015-02-05 – 2015-02-06 (×2): via INTRAVENOUS

## 2015-02-05 MED ORDER — VERAPAMIL HCL 120 MG PO TABS
120.0000 mg | ORAL_TABLET | Freq: Every day | ORAL | Status: DC
  Administered 2015-02-05 – 2015-02-06 (×2): 120 mg via ORAL
  Filled 2015-02-05 (×3): qty 1

## 2015-02-05 NOTE — Consult Note (Signed)
[place] [facility] Consult   Reason for Consult:  schizophrenia and medication management Referring Physician:  Dr. Charlies Silvers Patient Identification: Ronald Roman MRN:  [id number] Principal Diagnosis: Paranoid schizophrenia Diagnosis:   Patient Active Problem List   Diagnosis Date Noted  . Sleep apnea [G47.30] 02/05/2015  . Bacterial meningitis [G00.9] 02/05/2015  . AKI (acute kidney injury) [N17.9] 02/04/2015  . Seizure disorder [G40.909] 02/04/2015  . Acute encephalopathy [G93.40] 02/04/2015  . Diabetes mellitus without complication [Z99.3]   . Hyperlipidemia [E78.5]   . Hypertension [I10]   . Paranoid schizophrenia [F20.0]     Total Time spent with patient: 45 minutes  Subjective:   Ronald Roman is a 54 y.o. male   HPI:  Ronald Roman is a 54 y.o. male seen face-to-face for psychiatric consultation and evaluation of paranoid schizophrenia medication management along with psychiatric social service. Patient is awake, alert, oriented to time place person and situation. Patient is calm and cooperative during this evaluation. Patient was admitted to [facility] with altered mental status and questionable medication induced drowsiness and sedation or possible seizure episode which was not witnessed. Patient reported he has been compliant with his medication management for paranoid schizophrenia as prescribed by his mental health providers and has no reported adverse affects. Patient stated has been feeling good without paranoia, delusions, auditory and visual hallucinations. Patient denied current suicidal/homicidal ideation, intention or plans. Patient stated it has been long time that he has no psychiatric symptoms say cannot recall acute psychosis when he was not taken medication. Patient is willing to follow up with outpatient medication management and being compliant with his medications. He denies headache, earache, neck pain, chest pain, cough, dyspnea,  abdominal pain, constipation, nausea, vomiting, diarrhea, dysuria or urinary frequency. Patient urine drug screen is negative for drug of abuse.  HPI Elements:   Location:  Paranoid schizophrenia. Quality:  Fair. Severity:  Chronic. Timing:  Altered mental status. Duration:  Questionable seizure episode. Context:  Psychosocial.  Past Medical History:  Past Medical History  Diagnosis Date  . Diabetes mellitus without complication   . Hyperlipidemia   . Hypertension   . GERD (gastroesophageal reflux disease)   . Uric acid nephrolithiasis   . Sleep apnea   . Paranoid schizophrenia    History reviewed. No pertinent past surgical history. Family History: History reviewed. No pertinent family history. Social History:  History  Alcohol Use No     History  Drug Use No    Social History   Social History  . Marital Status: Single    Spouse Name: N/A  . Number of Children: N/A  . Years of Education: N/A   Social History Main Topics  . Smoking status: Current Every Day Smoker -- 1.00 packs/day    Types: Cigarettes  . Smokeless tobacco: None  . Alcohol Use: No  . Drug Use: No  . Sexual Activity: Not Asked   Other Topics Concern  . None   Social History Narrative   Additional Social History:       Allergies:   Allergies  Allergen Reactions  . Cogentin [Benztropine]     Per MAR   . Penicillins     Per MAR   . Prolixin [Fluphenazine]     Per [facility]     Labs:  Results for orders placed or performed during the hospital encounter of 02/03/15 (from the past 48 hour(s))  Valproic acid level     Status: None   Collection Time: 02/04/15  12:12 AM  Result Value Ref Range   Valproic Acid Lvl 67 50.0 - 100.0 ug/mL  Urine rapid drug screen (hosp performed)     Status: None   Collection Time: 02/04/15 12:59 AM  Result Value Ref Range   Opiates NONE DETECTED NONE DETECTED   Cocaine NONE DETECTED NONE DETECTED   Benzodiazepines NONE DETECTED NONE DETECTED   Amphetamines  NONE DETECTED NONE DETECTED   Tetrahydrocannabinol NONE DETECTED NONE DETECTED   Barbiturates NONE DETECTED NONE DETECTED    Comment:        DRUG SCREEN FOR MEDICAL PURPOSES ONLY.  IF CONFIRMATION IS NEEDED FOR ANY PURPOSE, NOTIFY LAB WITHIN 5 DAYS.        LOWEST DETECTABLE LIMITS FOR URINE DRUG SCREEN Drug Class       Cutoff (ng/mL) Amphetamine      1000 Barbiturate      200 Benzodiazepine   546 Tricyclics       270 Opiates          300 Cocaine          300 THC              50   Urinalysis, Routine w reflex microscopic (not at [facility])     Status: None   Collection Time: 02/04/15 12:59 AM  Result Value Ref Range   Color, Urine YELLOW YELLOW   APPearance CLEAR CLEAR   Specific Gravity, Urine 1.019 1.005 - 1.030   pH 5.0 5.0 - 8.0   Glucose, UA NEGATIVE NEGATIVE mg/dL   Hgb urine dipstick NEGATIVE NEGATIVE   Bilirubin Urine NEGATIVE NEGATIVE   Ketones, ur NEGATIVE NEGATIVE mg/dL   Protein, ur NEGATIVE NEGATIVE mg/dL   Urobilinogen, UA 0.2 0.0 - 1.0 mg/dL   Nitrite NEGATIVE NEGATIVE   Leukocytes, UA NEGATIVE NEGATIVE    Comment: MICROSCOPIC NOT DONE ON URINES WITH NEGATIVE PROTEIN, BLOOD, LEUKOCYTES, NITRITE, OR GLUCOSE <1000 mg/dL.  Urine culture     Status: None   Collection Time: 02/04/15 12:59 AM  Result Value Ref Range   Specimen Description URINE, CATHETERIZED    Special Requests NONE    Culture      NO GROWTH 1 DAY Performed at [facility]    Report Status 02/05/2015 FINAL   CBC with Differential/Platelet     Status: Abnormal   Collection Time: 02/04/15  1:09 AM  Result Value Ref Range   WBC 18.2 (H) 4.0 - 10.5 K/uL   RBC 4.78 4.22 - 5.81 MIL/uL   Hemoglobin 13.1 13.0 - 17.0 g/dL   HCT 38.8 (L) 39.0 - 52.0 %   MCV 81.2 78.0 - 100.0 fL   MCH 27.4 26.0 - 34.0 pg   MCHC 33.8 30.0 - 36.0 g/dL   RDW 15.1 11.5 - 15.5 %   Platelets 257 150 - 400 K/uL   Neutrophils Relative % 71 43 - 77 %   Neutro Abs 13.0 (H) 1.7 - 7.7 K/uL   Lymphocytes Relative 22 12 -  46 %   Lymphs Abs 4.0 0.7 - 4.0 K/uL   Monocytes Relative 7 3 - 12 %   Monocytes Absolute 1.3 (H) 0.1 - 1.0 K/uL   Eosinophils Relative 0 0 - 5 %   Eosinophils Absolute 0.1 0.0 - 0.7 K/uL   Basophils Relative 0 0 - 1 %   Basophils Absolute 0.0 0.0 - 0.1 K/uL  Comprehensive metabolic panel     Status: Abnormal   Collection Time: 02/04/15  1:09 AM  Result  Value Ref Range   Sodium 136 135 - 145 mmol/L   Potassium 4.4 3.5 - 5.1 mmol/L   Chloride 103 101 - 111 mmol/L   CO2 19 (L) 22 - 32 mmol/L   Glucose, Bld 227 (H) 65 - 99 mg/dL   BUN 38 (H) 6 - 20 mg/dL   Creatinine, Ser 1.80 (H) 0.61 - 1.24 mg/dL   Calcium 9.0 8.9 - 10.3 mg/dL   Total Protein 7.3 6.5 - 8.1 g/dL   Albumin 3.7 3.5 - 5.0 g/dL   AST 54 (H) 15 - 41 U/L   ALT 31 17 - 63 U/L   Alkaline Phosphatase 73 38 - 126 U/L   Total Bilirubin 0.6 0.3 - 1.2 mg/dL   GFR calc non Af Amer 41 (L) >60 mL/min   GFR calc Af Amer 48 (L) >60 mL/min    Comment: (NOTE) The eGFR has been calculated using the CKD EPI equation. This calculation has not been validated in all clinical situations. eGFR's persistently <60 mL/min signify possible Chronic Kidney Disease.    Anion gap 14 5 - 15  Troponin I     Status: None   Collection Time: 02/04/15  1:09 AM  Result Value Ref Range   Troponin I <0.03 <0.031 ng/mL    Comment:        NO INDICATION OF MYOCARDIAL INJURY.   Magnesium     Status: Abnormal   Collection Time: 02/04/15  1:09 AM  Result Value Ref Range   Magnesium 1.3 (L) 1.7 - 2.4 mg/dL  Phosphorus     Status: Abnormal   Collection Time: 02/04/15  1:09 AM  Result Value Ref Range   Phosphorus 4.9 (H) 2.5 - 4.6 mg/dL  Ethanol     Status: None   Collection Time: 02/04/15  1:09 AM  Result Value Ref Range   Alcohol, Ethyl (B) <5 <5 mg/dL    Comment:        LOWEST DETECTABLE LIMIT FOR SERUM ALCOHOL IS 5 mg/dL FOR MEDICAL PURPOSES ONLY   Ammonia     Status: None   Collection Time: 02/04/15  1:09 AM  Result Value Ref Range    Ammonia 34 9 - 35 umol/L  CK     Status: Abnormal   Collection Time: 02/04/15  1:09 AM  Result Value Ref Range   Total CK 2223 (H) 49 - 397 U/L  TSH     Status: None   Collection Time: 02/04/15  1:09 AM  Result Value Ref Range   TSH 1.633 0.350 - 4.500 uIU/mL  Hemoglobin A1c     Status: Abnormal   Collection Time: 02/04/15  1:09 AM  Result Value Ref Range   Hgb A1c MFr Bld 7.2 (H) 4.8 - 5.6 %    Comment: (NOTE)         Pre-diabetes: 5.7 - 6.4         Diabetes: >6.4         Glycemic control for adults with diabetes: <7.0    Mean Plasma Glucose 160 mg/dL    Comment: (NOTE) Performed At: [facility] [address] [place], [place] [id number] Lindon Romp MD PV:[id number]   I-Stat CG4 Lactic Acid, ED     Status: Abnormal   Collection Time: 02/04/15  1:18 AM  Result Value Ref Range   Lactic Acid, Venous 5.00 (HH) 0.5 - 2.0 mmol/L  I-Stat CG4 Lactic Acid, ED     Status: Abnormal   Collection Time: 02/04/15  3:01  AM  Result Value Ref Range   Lactic Acid, Venous 3.62 (HH) 0.5 - 2.0 mmol/L   Comment NOTIFIED PHYSICIAN   CSF cell count with differential collection tube #: 1     Status: Abnormal   Collection Time: 02/04/15  6:55 AM  Result Value Ref Range   Tube # 1    Color, CSF COLORLESS COLORLESS   Appearance, CSF CLEAR (A) CLEAR   Supernatant NOT INDICATED    RBC Count, CSF 9 (H) 0 /cu mm   WBC, CSF 2 0 - 5 /cu mm   Other Cells, CSF TOO FEW TO COUNT, SMEAR AVAILABLE FOR REVIEW     Comment: RARE LYMPHOCYTES AND MONOCYTES SEEN.   CSF cell count with differential collection tube #: 4     Status: Abnormal   Collection Time: 02/04/15  6:55 AM  Result Value Ref Range   Tube # 4    Color, CSF COLORLESS COLORLESS   Appearance, CSF CLEAR (A) CLEAR   Supernatant NOT INDICATED    RBC Count, CSF 2 (H) 0 /cu mm   WBC, CSF 3 0 - 5 /cu mm   Other Cells, CSF TOO FEW TO COUNT, SMEAR AVAILABLE FOR REVIEW     Comment: RARE LYMPHOCYTES AND MONOCYTES.  CSF culture     Status:  None (Preliminary result)   Collection Time: 02/04/15  6:55 AM  Result Value Ref Range   Specimen Description CSF    Special Requests Immunocompromised    Gram Stain      WBC PRESENT, PREDOMINANTLY MONONUCLEAR NO ORGANISMS SEEN Gram Stain Report Called to,Read Back By and Verified With: HASZ,S. RN '@0918'  ON 9.13.16 BY MCCOY,N.    Culture      NO GROWTH 1 DAY Performed at [facility]    Report Status PENDING   Glucose, CSF     Status: Abnormal   Collection Time: 02/04/15  6:55 AM  Result Value Ref Range   Glucose, CSF 149 (H) 40 - 70 mg/dL  Protein, CSF     Status: None   Collection Time: 02/04/15  6:55 AM  Result Value Ref Range   Total  Protein, CSF 34 15 - 45 mg/dL  CBG monitoring, ED     Status: Abnormal   Collection Time: 02/04/15  8:19 AM  Result Value Ref Range   Glucose-Capillary 321 (H) 65 - 99 mg/dL   Comment 1 Notify RN    Comment 2 Document in Chart   Glucose, capillary     Status: Abnormal   Collection Time: 02/04/15  1:13 PM  Result Value Ref Range   Glucose-Capillary 196 (H) 65 - 99 mg/dL   Comment 1 Notify RN   Lactic acid, plasma     Status: None   Collection Time: 02/04/15  4:45 PM  Result Value Ref Range   Lactic Acid, Venous 1.3 0.5 - 2.0 mmol/L  Glucose, capillary     Status: Abnormal   Collection Time: 02/04/15  5:28 PM  Result Value Ref Range   Glucose-Capillary 111 (H) 65 - 99 mg/dL  Lactic acid, plasma     Status: Abnormal   Collection Time: 02/04/15  7:44 PM  Result Value Ref Range   Lactic Acid, Venous 2.4 (HH) 0.5 - 2.0 mmol/L    Comment: CRITICAL RESULT CALLED TO, READ BACK BY AND VERIFIED WITH: Ines Bloomer [id number] @ 2027 BY J SCOTTON   Glucose, capillary     Status: Abnormal   Collection Time: 02/04/15 10:15 PM  Result Value Ref Range   Glucose-Capillary 125 (H) 65 - 99 mg/dL   Comment 1 Notify RN   Comprehensive metabolic panel     Status: Abnormal   Collection Time: 02/05/15  5:30 AM  Result Value Ref Range   Sodium 140  135 - 145 mmol/L   Potassium 4.1 3.5 - 5.1 mmol/L   Chloride 113 (H) 101 - 111 mmol/L   CO2 18 (L) 22 - 32 mmol/L   Glucose, Bld 165 (H) 65 - 99 mg/dL   BUN 13 6 - 20 mg/dL   Creatinine, Ser 1.13 0.61 - 1.24 mg/dL   Calcium 7.8 (L) 8.9 - 10.3 mg/dL   Total Protein 6.0 (L) 6.5 - 8.1 g/dL   Albumin 3.1 (L) 3.5 - 5.0 g/dL   AST 28 15 - 41 U/L   ALT 24 17 - 63 U/L   Alkaline Phosphatase 55 38 - 126 U/L   Total Bilirubin 0.5 0.3 - 1.2 mg/dL   GFR calc non Af Amer >60 >60 mL/min   GFR calc Af Amer >60 >60 mL/min    Comment: (NOTE) The eGFR has been calculated using the CKD EPI equation. This calculation has not been validated in all clinical situations. eGFR's persistently <60 mL/min signify possible Chronic Kidney Disease.    Anion gap 9 5 - 15  CBC     Status: Abnormal   Collection Time: 02/05/15  5:30 AM  Result Value Ref Range   WBC 9.5 4.0 - 10.5 K/uL   RBC 4.17 (L) 4.22 - 5.81 MIL/uL   Hemoglobin 11.1 (L) 13.0 - 17.0 g/dL   HCT 34.1 (L) 39.0 - 52.0 %   MCV 81.8 78.0 - 100.0 fL   MCH 26.6 26.0 - 34.0 pg   MCHC 32.6 30.0 - 36.0 g/dL   RDW 15.4 11.5 - 15.5 %   Platelets 244 150 - 400 K/uL  Lactic acid, plasma     Status: None   Collection Time: 02/05/15  5:30 AM  Result Value Ref Range   Lactic Acid, Venous 1.8 0.5 - 2.0 mmol/L  Glucose, capillary     Status: Abnormal   Collection Time: 02/05/15  7:27 AM  Result Value Ref Range   Glucose-Capillary 229 (H) 65 - 99 mg/dL   Comment 1 Notify RN   Glucose, capillary     Status: Abnormal   Collection Time: 02/05/15 12:03 PM  Result Value Ref Range   Glucose-Capillary 103 (H) 65 - 99 mg/dL   Comment 1 Notify RN     Vitals: Blood pressure 139/59, pulse 72, temperature 98.3 F (36.8 C), temperature source Oral, resp. rate 18, height 6' (1.829 m), weight 109.9 kg (242 lb 4.6 oz), SpO2 98 %.  Risk to Self: Is patient at risk for suicide?: No Risk to Others:   Prior Inpatient Therapy:   Prior Outpatient Therapy:    Current  Facility-Administered Medications  Medication Dose Route Frequency Provider Last Rate Last Dose  . 0.9 %  sodium chloride infusion   Intravenous Continuous Robbie Lis, MD      . acetaminophen (TYLENOL) tablet 650 mg  650 mg Oral Q6H PRN Modena Jansky, MD       Or  . acetaminophen (TYLENOL) suppository 650 mg  650 mg Rectal Q6H PRN Modena Jansky, MD      . albuterol (PROVENTIL) (2.5 MG/3ML) 0.083% nebulizer solution 2.5 mg  2.5 mg Nebulization Q2H PRN Modena Jansky, MD      . [  START ON 02/06/2015] atorvastatin (LIPITOR) tablet 40 mg  40 mg Oral Q breakfast Robbie Lis, MD      . cefTRIAXone (ROCEPHIN) 2 g in dextrose 5 % 50 mL IVPB  2 g Intravenous Q12H Modena Jansky, MD   2 g at 02/05/15 0244  . chlorhexidine (PERIDEX) 0.12 % solution 15 mL  15 mL Mouth/Throat BID Modena Jansky, MD   15 mL at 02/05/15 1110  . cloZAPine (CLOZARIL) tablet 200 mg  200 mg Oral BID Robbie Lis, MD      . divalproex (DEPAKOTE) DR tablet 1,250 mg  1,250 mg Oral QHS Modena Jansky, MD   1,250 mg at 02/04/15 2207  . enoxaparin (LOVENOX) injection 40 mg  40 mg Subcutaneous Q24H Modena Jansky, MD   40 mg at 02/05/15 1111  . insulin aspart (novoLOG) injection 0-5 Units  0-5 Units Subcutaneous QHS Modena Jansky, MD   0 Units at 02/04/15 2219  . insulin aspart (novoLOG) injection 0-9 Units  0-9 Units Subcutaneous TID WC Modena Jansky, MD   3 Units at 02/05/15 [phone]  . linagliptin (TRADJENTA) tablet 5 mg  5 mg Oral Daily Robbie Lis, MD      . LORazepam (ATIVAN) injection 1 mg  1 mg Intravenous Q6H PRN Modena Jansky, MD      . LORazepam (ATIVAN) tablet 0.5 mg  0.5 mg Oral BID Modena Jansky, MD   0.5 mg at 02/05/15 1111  . metoprolol tartrate (LOPRESSOR) tablet 25 mg  25 mg Oral BID Robbie Lis, MD      . QUEtiapine (SEROQUEL) tablet 100 mg  100 mg Oral BID Robbie Lis, MD      . QUEtiapine (SEROQUEL) tablet 75 mg  75 mg Oral BID Robbie Lis, MD      . sodium chloride 0.9 % injection  3 mL  3 mL Intravenous Q12H Modena Jansky, MD   3 mL at 02/05/15 1111  . tiotropium (SPIRIVA) inhalation capsule 18 mcg  18 mcg Inhalation Daily Robbie Lis, MD      . verapamil (CALAN) tablet 120 mg  120 mg Oral QHS Robbie Lis, MD        Musculoskeletal: Strength & Muscle Tone: within normal limits Gait & Station: normal Patient leans: N/A  Psychiatric Specialty Exam: Physical Exam as per history and physical   ROS No Fever-chills, No Headache, No changes with Vision or hearing, reports vertigo No problems swallowing food or Liquids, No Chest pain, Cough or Shortness of Breath, No Abdominal pain, No Nausea or Vommitting, Bowel movements are regular, No Blood in stool or Urine, No dysuria, No new skin rashes or bruises, No new joints pains-aches,  No new weakness, tingling, numbness in any extremity, No recent weight gain or loss, No polyuria, polydypsia or polyphagia,   A full 10 point Review of Systems was done, except as stated above, all other Review of Systems were negative.  Blood pressure 139/59, pulse 72, temperature 98.3 F (36.8 C), temperature source Oral, resp. rate 18, height 6' (1.829 m), weight 109.9 kg (242 lb 4.6 oz), SpO2 98 %.Body mass index is 32.85 kg/(m^2).  General Appearance: Disheveled  Eye Contact::  Good  Speech:  Clear and Coherent and Slow  Volume:  Normal  Mood:  Euthymic  Affect:  Constricted and Depressed  Thought Process:  Coherent and Goal Directed  Orientation:  Full (Time, Place, and Person)  Thought Content:  WDL  Suicidal Thoughts:  No  Homicidal Thoughts:  No  Memory:  Immediate;   Fair Recent;   Fair  Judgement:  Intact  Insight:  Fair  Psychomotor Activity:  Decreased  Concentration:  Fair  Recall:  Good  Fund of Knowledge:Good  Language: Good  Akathisia:  Negative  Handed:  Right  AIMS (if indicated):     Assets:  Communication Skills Desire for Improvement Financial Resources/Insurance Housing Leisure  Time Resilience Social Support Transportation  ADL's:  Intact  Cognition: WNL  Sleep:      Medical Decision Making: Review of Psycho-Social Stressors (1), Review or order clinical lab tests (1), Established Problem, Worsening (2), Review of Last Therapy Session (1), Review or order medicine tests (1), Review of Medication Regimen & Side Effects (2) and Review of New Medication or Change in Dosage (2)  Treatment Plan Summary: Patient has been suffering with paranoid schizophrenia, seizure disorder and multiple medical problems. Patient has been compliant with his psychiatric medication and being stable without current symptoms of depression, anxiety, psychosis and suicidal/homicidal ideation, intention or plans. Daily contact with patient to assess and evaluate symptoms and progress in treatment and Medication management  Plan:  Clozapine 100 mg twice daily for psychosis Depakote DR: 150 mg daily at bedtime for mood stabilization Seroquel reduced to 50 mg twice daily to prevent excessive sedation Patient does not meet criteria for psychiatric inpatient admission. Supportive therapy provided about ongoing stressors. Appreciate psychiatric consultation Please contact [phone] or [phone] if needs further assistance  Disposition: Patient will be referred to the outpatient medication management and medically stable.  Vinod Mikesell,JANARDHAHA R. 02/05/2015 2:32 PM

## 2015-02-05 NOTE — Progress Notes (Signed)
Patient ID: Ronald Roman, male   DOB: [birth year], 54 y.o.   MRN: [id number] [facility] PROGRESS NOTE  Ronald Roman NWG:[id number] DOB: 1960-07-07 DOA: 02/03/2015 PCP: Vinnie Langton, NP  Brief narrative:    54 year old male, from group home, past medical history of paranoid schizophrenia, possible seizure disorder, diabetes, hypertension, dyslipidemia who presented to [facility] 02/03/2015 with worsening mental status changes. Patient is not able to provide details of the medical history. Per ED physician, the residential facility was unable to locate the patient the entire day. When he was finally located on Monday he was noted to inappropriately laugh with answering questions incoherently. Later on he was found lying on the floor of the porch covered in feces, not responding to verbal or painful stimuli.  In ED, sepsis protocol was initiated. A lumbar puncture was performed and patient was started on empiric treatment with vancomycin, Rocephin and Bactrim for possible meningitis. CT head and CXR showed no acute findings.   Anticipated discharge: Likely to group home once mental status improves. Anticipated discharge by 02/09/2015.    Assessment/Plan:    Principal Problem:   Acute encephalopathy / Paranoid schizophrenia - Unclear etiology. Possible combination of polypharmacy for paranoid schizophrenia versus seizures. Possibility also includes meningitis. - EEG on this admission did not show active seizures. Ammonia level was within normal limits. - CT head did not show acute intracranial findings. - Mental status slightly better this morning. Patient is alert but has incoherent speech. - We will resume patient's home medications including sertraline and clonazepam and see if mental status improves further.  Active Problems:   Possible bacterial meningitis - Initial thought was that patient has bacterial meningitis. - Patient was seen by neurology in  consultation. Lumbar puncture done on the admission. CSF does not look infectious. - Neurology recommends discontinuing abx. We will do so today.    Diabetes mellitus without complication - Resume Januvia. Metformin on hold because of acute renal failure. - Continue SSI.  - CBG's since admission: 125, 229, 103    Hyperlipidemia - Resume statin therapy    Hypertension, Essential - Resume metoprolol 25 mg twice daily. - Resume verapamil 120 mg at bedtime - Continue to hold lisinopril and HCTZ because of acute renal failure    AKI (acute kidney injury) - Likely from lisinopril, HCTZ and metformin. All these medications are nephrotoxic and could have potentially contributed to acute renal failure. - HCTZ, lisinopril and metformin all on hold. - Continue low rate IV fluids. - Follow-up BMP tomorrow morning    Seizure disorder - No seizures evident on EEG. - Continue Depakote    Sleep apnea - Stable. Continue Spiriva.   DVT Prophylaxis  - Lovenox subQ ordered    Code Status: Full.  Family Communication:  Family not at the bedside this morning Disposition Plan: Patient requires physical therapy evaluation for safe discharge plan. Anticipated discharge by 02/09/2015.  IV access:  Peripheral IV  Procedures and diagnostic studies:    Ct Head Wo Contrast 02/04/2015   No acute intracranial pathology.   Electronically Signed   By: Elgie Collard M.D.   On: 02/04/2015 00:58   Dg Chest Portable 1 View 02/04/2015   Bronchial thickening, may be chronic or represent acute bronchitis. Minimal linear atelectasis at the right lung base.   Electronically Signed   By: Rubye Oaks M.D.   On: 02/04/2015 03:11   EEG 02/04/2015 -  Normal awake and asleep EEG.  Medical Consultants:  Neurology - Dr. Wyatt Portela   Other Consultants:  Physical therapy  IAnti-Infectives:   Rocephin 02/04/2015 --> Bactrim 02/04/2015 --> Vancomycin 02/04/2015 -->   Manson Passey, MD  [facility] Pager [phone]  Time spent in minutes: 25 minutes  If 7PM-7AM, please contact night-coverage [email or web address] Password [email or web address] 02/05/2015, 1:25 PM   LOS: 1 day    HPI/Subjective: No acute overnight events. No respiratory distress.    Objective: Filed Vitals:   02/04/15 1015 02/04/15 1500 02/04/15 2206 02/05/15 0602  BP: 115/67 114/68 113/67 124/70  Pulse: 74 98 93 91  Temp: 98.4 F (36.9 C) 98.1 F (36.7 C) 98.2 F (36.8 C) 98.2 F (36.8 C)  TempSrc: Oral Oral Oral Oral  Resp: 20 18 18 18   Height: 6' (1.829 m)     Weight: 108.4 kg (238 lb 15.7 oz)   109.9 kg (242 lb 4.6 oz)  SpO2: 100% 98% 98% 98%    Intake/Output Summary (Last 24 hours) at 02/05/15 1325 Last data filed at 02/05/15 1111  Gross per 24 hour  Intake 6014.67 ml  Output   3025 ml  Net 2989.67 ml    Exam:   General:  Pt is alert, not in acute distress  Cardiovascular: Regular rate and rhythm, S1/S2 (+)  Respiratory: Clear to auscultation bilaterally, no wheezing, no crackles, no rhonchi  Abdomen: Soft, non tender, non distended, bowel sounds present  Extremities: No edema, pulses DP and PT palpable bilaterally  Neuro: Grossly nonfocal  Data Reviewed: Basic Metabolic Panel:  Recent Labs Lab 02/04/15 0109 02/05/15 0530  NA 136 140  K 4.4 4.1  CL 103 113*  CO2 19* 18*  GLUCOSE 227* 165*  BUN 38* 13  CREATININE 1.80* 1.13  CALCIUM 9.0 7.8*  MG 1.3*  --   PHOS 4.9*  --    Liver Function Tests:  Recent Labs Lab 02/04/15 0109 02/05/15 0530  AST 54* 28  ALT 31 24  ALKPHOS 73 55  BILITOT 0.6 0.5  PROT 7.3 6.0*  ALBUMIN 3.7 3.1*   No results for input(s): LIPASE, AMYLASE in the last 168 hours.  Recent Labs Lab 02/04/15 0109  AMMONIA 34   CBC:  Recent Labs Lab 02/04/15 0109 02/05/15 0530  WBC 18.2* 9.5  NEUTROABS 13.0*  --   HGB 13.1 11.1*  HCT 38.8* 34.1*  MCV 81.2 81.8  PLT 257 244   Cardiac Enzymes:  Recent Labs Lab 02/04/15 0109  CKTOTAL 2223*   TROPONINI <0.03   BNP: Invalid input(s): POCBNP CBG:  Recent Labs Lab 02/04/15 1313 02/04/15 1728 02/04/15 2215 02/05/15 0727 02/05/15 1203  GLUCAP 196* 111* 125* 229* 103*    Recent Results (from the past 240 hour(s))  Urine culture     Status: None   Collection Time: 02/04/15 12:59 AM  Result Value Ref Range Status   Specimen Description URINE, CATHETERIZED  Final   Special Requests NONE  Final   Culture   Final    NO GROWTH 1 DAY Performed at [facility]    Report Status 02/05/2015 FINAL  Final  CSF culture     Status: None (Preliminary result)   Collection Time: 02/04/15  6:55 AM  Result Value Ref Range Status   Specimen Description CSF  Final   Special Requests Immunocompromised  Final   Gram Stain   Final    WBC PRESENT, PREDOMINANTLY MONONUCLEAR NO ORGANISMS SEEN Gram Stain Report Called to,Read Back By and Verified With: HASZ,S. RN @0918  ON 9.13.16 BY MCCOY,N.  Culture   Final    NO GROWTH 1 DAY Performed at [facility]    Report Status PENDING  Incomplete     Scheduled Meds: . cefTRIAXone (ROCEPHIN)  IV  2 g Intravenous Q12H  . chlorhexidine  15 mL Mouth/Throat BID  . divalproex  1,250 mg Oral QHS  . enoxaparin (LOVENOX) injection  40 mg Subcutaneous Q24H  . insulin aspart  0-5 Units Subcutaneous QHS  . insulin aspart  0-9 Units Subcutaneous TID WC  . LORazepam  0.5 mg Oral BID  . sodium chloride  3 mL Intravenous Q12H  . sulfamethoxazole-trimethoprim  448 mg of trimethoprim Intravenous 4 times per day  . vancomycin  1,250 mg Intravenous Q24H

## 2015-02-05 NOTE — Clinical Social Work Psych Assess (Signed)
Clinical Social Work Librarian, academic  Clinical Social Worker:  Loleta Dicker, [name] Date/Time:  02/05/2015, 12:17 PM Referred By:  Physician Date Referred:  02/05/15 Reason for Referral:  Behavioral Health Issues   Presenting Symptoms/Problems  Presenting Symptoms/Problems(in person's/family's own words):  Patient reports he came into the hospital due to seizure.    Abuse/Neglect/Trauma History  Abuse/Neglect/Trauma History:  Denies History Abuse/Neglect/Trauma History Comments (indicate dates): N/A   Psychiatric History  Psychiatric History:  Inpatient/Hospitalization (Per patient, he has been to [facility] and [facility]) Psychiatric Medication:  Depakote, Ativan  Current Mental Health Hospitalizations/Previous Mental Health History:  Patient has a history of schizophrenia.   Current Provider:  Vickii Penna and Date:  [place], [name]  Current Medications:  Scheduled Meds: . cefTRIAXone (ROCEPHIN)  IV  2 g Intravenous Q12H  . chlorhexidine  15 mL Mouth/Throat BID  . divalproex  1,250 mg Oral QHS  . enoxaparin (LOVENOX) injection  40 mg Subcutaneous Q24H  . insulin aspart  0-5 Units Subcutaneous QHS  . insulin aspart  0-9 Units Subcutaneous TID WC  . LORazepam  0.5 mg Oral BID  . sodium chloride  3 mL Intravenous Q12H  . sulfamethoxazole-trimethoprim  448 mg of trimethoprim Intravenous 4 times per day  . vancomycin  1,250 mg Intravenous Q24H   Continuous Infusions:  PRN Meds:.acetaminophen **OR** acetaminophen, albuterol, LORazepam   Previous Inpatient Admission/Date/Reason:  Per patient, he has been to [facility] and [facility] in the past.   Emotional Health/Current Symptoms  Suicide/Self Harm: None Reported Suicide Attempt in Past (date/description):  Per Zacarias Pontes (Child psychotherapist at [employer]), patient has attempted to commit suicide multiple times in the past.   Other Harmful Behavior (ex. homicidal ideation) (describe):  N/A   Psychotic/Dissociative Symptoms  Psychotic/Dissociative Symptoms: None Reported Other Psychotic/Dissociative Symptoms:  N/A   Attention/Behavioral Symptoms  Attention/Behavioral Symptoms: Inattentive, Restless Other Attention/Behavioral Symptoms:  N/A   Cognitive Impairment  Cognitive Impairment:  Within Normal Limits Other Cognitive Impairment:  Patient oriented.    Mood and Adjustment  Mood and Adjustment:  Flat   Stress, Anxiety, Trauma, Any Recent Loss/Stressor  Stress, Anxiety, Trauma, Any Recent Loss/Stressor: None Reported Anxiety (frequency):  N/A  Phobia (specify):  N/A  Compulsive Behavior (specify):  N/A  Obsessive Behavior (specify):  N/A  Other Stress, Anxiety, Trauma, Any Recent Loss/Stressor: None reported   Substance Abuse/Use  Substance Abuse/Use: None (Patient denies any substance use) SBIRT Completed (please refer for detailed history): No Self-reported Substance Use (last use and frequency):  N/A  Urinary Drug Screen Completed: Yes Alcohol Level:  N/A   Environment/Housing/Living Arrangement  Environmental/Housing/Living Arrangement: Stable Housing Who is in the Home:  Patient currently stays alone. Housing provided by [facility] [employer] [facility]  Emergency Contact:  Mother: Michel Bickers [phone]   Financial  Financial: Medicaid   Patient's Strengths and Goals  Patient's Strengths and Goals (patient's own words):  Patient states he has received treatment in the past.    Clinical Social Worker's Interpretive Summary  Clinical Social Workers Interpretive Summary:  CSW went and spoke with patient. CSE introduced self and acknowledged the patient and continued to assess. Patient lives alone at [address] [place], [name]. Per Social worker Zacarias Pontes at [employer], patient has been provided housing through [employer] [facility] [employer]. Prior to receiving this apartment, patient was at [facility] for  about a month. Per [place], patient has been in and out of ALF's  for more than 10 years. Patient receives peer support from [facility]. Patient also receives medication management and outpatient therapy with [facility]. Per Leavy Cella, patient has a Gaffer from [place] by the name of Melodie, number: [phone].   Patient has a hx of schizophrenia. Patient currently denies SI/HI/AVH. Patient also denies any past SI attempts. Patient was unsure of medications and primary physician. Patient stated "he has never been diagnosed with anything in the past". Per [facility], patient has been hospitalized multiple times in the past for SI attempts.   Denies any current stressors.   CSW and Psych MD to round patient.    Disposition  Disposition: Recommend Psych CSW Continuing To Support While In [facility]

## 2015-02-06 ENCOUNTER — Inpatient Hospital Stay (HOSPITAL_COMMUNITY): Payer: Medicaid Other

## 2015-02-06 DIAGNOSIS — D72829 Elevated white blood cell count, unspecified: Secondary | ICD-10-CM | POA: Diagnosis present

## 2015-02-06 LAB — GLUCOSE, CAPILLARY
GLUCOSE-CAPILLARY: 108 mg/dL — AB (ref 65–99)
Glucose-Capillary: 129 mg/dL — ABNORMAL HIGH (ref 65–99)
Glucose-Capillary: 121 mg/dL — ABNORMAL HIGH (ref 65–99)
Glucose-Capillary: 104 mg/dL — ABNORMAL HIGH (ref 65–99)

## 2015-02-06 MED ORDER — METFORMIN HCL 500 MG PO TABS
1000.0000 mg | ORAL_TABLET | Freq: Two times a day (BID) | ORAL | Status: DC
  Administered 2015-02-06 – 2015-02-07 (×2): 1000 mg via ORAL
  Filled 2015-02-06 (×4): qty 2

## 2015-02-06 MED ORDER — QUETIAPINE FUMARATE 50 MG PO TABS
50.0000 mg | ORAL_TABLET | Freq: Two times a day (BID) | ORAL | Status: DC
  Administered 2015-02-06 – 2015-02-07 (×2): 50 mg via ORAL
  Filled 2015-02-06 (×3): qty 1

## 2015-02-06 MED ORDER — CLOZAPINE 100 MG PO TABS
100.0000 mg | ORAL_TABLET | Freq: Two times a day (BID) | ORAL | Status: DC
  Administered 2015-02-06 – 2015-02-07 (×2): 100 mg via ORAL
  Filled 2015-02-06 (×3): qty 1

## 2015-02-06 NOTE — Progress Notes (Signed)
Please contact [facility], inpatient coordinator for [facility], at discharge at [phone] so that she can pick him up from hospital.

## 2015-02-06 NOTE — Evaluation (Signed)
Physical Therapy Evaluation Patient Details Name: Ronald Roman MRN: [id number] DOB: 07-30-1960 Today's Date: 02/06/2015   History of Present Illness  54 year old male, from group home, past medical history of paranoid schizophrenia, possible seizure disorder, diabetes, hypertension, dyslipidemia who presented to [facility] hospital 02/03/2015 with worsening mental status changes  Clinical Impression  Pt will benefit from PT to address deficits below; pt quite lethargic, eval limited;RN aware;     Follow Up Recommendations Home health PT;SNF (depending on progress)    Equipment Recommendations  Other (comment) (TBA)    Recommendations for Other Services       Precautions / Restrictions Precautions Precautions: Fall      Mobility  Bed Mobility Overal bed mobility: Needs Assistance Bed Mobility: Supine to Sit;Sit to Supine     Supine to sit: Min assist;HOB elevated Sit to supine: Min guard;HOB elevated   General bed mobility comments: pt sat EOB with multi-modal cues and assist for LEs off bed and trunk to come to upright; pt awake but then pushes self back into bed, lies down with feet hanging of bed  and begins snoring  Transfers                    Ambulation/Gait                Stairs            Wheelchair Mobility    Modified Rankin (Stroke Patients Only)       Balance Overall balance assessment: Needs assistance   Sitting balance-Leahy Scale: Fair Sitting balance - Comments: unable to test further at tiem of eval d/t pt lethargy                                     Pertinent Vitals/Pain Pain Assessment: No/denies pain    Home Living Family/patient expects to be discharged to:: Group home Living Arrangements: Group Home                    Prior Function           Comments: pt does not consistently give info today; he does state that he walked I'ly     Hand Dominance        Extremity/Trunk  Assessment   Upper Extremity Assessment: Generalized weakness           Lower Extremity Assessment: Generalized weakness         Communication      Cognition Arousal/Alertness: Lethargic Behavior During Therapy: Flat affect Overall Cognitive Status: No family/caregiver present to determine baseline cognitive functioning Area of Impairment: Following commands;Problem solving       Following Commands: Follows one step commands inconsistently     Problem Solving: Slow processing;Requires verbal cues;Decreased initiation      General Comments      Exercises        Assessment/Plan    PT Assessment Patient needs continued PT services  PT Diagnosis Difficulty walking;Generalized weakness   PT Problem List Decreased activity tolerance;Decreased mobility;Decreased safety awareness  PT Treatment Interventions DME instruction;Gait training;Functional mobility training;Therapeutic activities;Patient/family education;Therapeutic exercise   PT Goals (Current goals can be found in the Care Plan section) Acute Rehab PT Goals Patient Stated Goal: pt unable to state PT Goal Formulation: Patient unable to participate in goal setting Time For Goal Achievement: 02/20/15 Potential to Achieve Goals: Good  Frequency Min 3X/week   Barriers to discharge        Co-evaluation               End of Session   Activity Tolerance: Patient limited by lethargy Patient left: in bed;with call bell/phone within reach;with bed alarm set Nurse Communication: Mobility status         Time: 1351-1411 PT Time Calculation (min) (ACUTE ONLY): 20 min   Charges:   PT Evaluation $Initial PT Evaluation Tier I: 1 Procedure     PT G CodesDrucilla Chalet 03-02-15, 3:23 PM

## 2015-02-06 NOTE — Progress Notes (Addendum)
Patient ID: Ronald Roman, male   DOB: May 05, 1961, 54 y.o.   MRN: [id number] [facility] PROGRESS NOTE  Ronald Roman MWN:[id number] DOB: 01-Sep-1960 DOA: 02/03/2015 PCP: Vinnie Langton, NP  Brief narrative:    54 year old male, from group home, past medical history of paranoid schizophrenia, possible seizure disorder, diabetes, hypertension, dyslipidemia who presented to [facility] 02/03/2015 with worsening mental status changes. Patient is not able to provide details of the medical history. Per ED physician, the residential facility was unable to locate the patient the entire day. When he was finally located on Monday he was noted to inappropriately laugh with answering questions incoherently. Later on he was found lying on the floor of the porch covered in feces, not responding to verbal or painful stimuli.  In ED, sepsis protocol was initiated. A lumbar puncture was performed and patient was started on empiric treatment with vancomycin, Rocephin and Bactrim for possible meningitis. CT head and CXR showed no acute findings. Neurology has seen the patient in consultation. Per neurology, CSF did not look infectious so all antibiotics stopped 02/05/2015.    Anticipated discharge: Discharge to group home likely 02/07/2015.    Assessment/Plan:    Principal Problem:   Acute encephalopathy / Paranoid schizophrenia / Leukocytosis - Unclear etiology however it could be secondary to paranoid schizophrenia and seizures. - Initially we thought that there may be a possibility for bacterial meningitis however CSF did not look infectious. Rocephin, Bactrim and vancomycin were started on the admission and subsequently stopped 02/05/2015. This was recommended by neurology. Appreciate their input. - CT head did not demonstrate acute intracranial findings. - Will have the pt on clonazepam 100 mg BID instead of 200 mg BID. We will reduce sertraline to 100 mg instead of 175 mg. Stop  scheduled ativan.  - Ammonia was within normal limits on the admission. EEG did not demonstrate seizure activity. - We will continue to monitor her mental status and any potential changes for next 24 hours. - Anticipated discharge 02/07/2015.   Active Problems:   Diabetes mellitus without complication - Continue Januvia.  - Metformin placed on hold because of acute renal failure. Now that renal function is back to normal will resume the metformin and check renal function tomorrow morning.    Hyperlipidemia - Continue Lipitor 40 mg daily    Hypertension, Essential - Continue metoprolol and verapamil. - HCTZ and lisinopril were held on the admission because of acute renal failure. Renal function is now within normal limits however blood pressure is 113/62 and adequately controlled with metoprolol and verapamil only. Continue to hold lisinopril and HCTZ.    AKI (acute kidney injury) - Likely from lisinopril, HCTZ and metformin. All these medications are nephrotoxic and could have potentially contributed to acute renal failure. - HCTZ, lisinopril and metformin all placed on hold on admission. - We will resume metformin today because renal function is at baseline. Blood pressure medications on hold because of well controlled blood pressure with current medications metoprolol and verapamil.    Seizure disorder - EEG did not demonstrate active seizures. - No reports of seizures in hospital. - Continue Depakote.    Sleep apnea - Continue Spiriva.   DVT Prophylaxis  - Lovenox subQ ordered in hospital    Code Status: Full.  Family Communication:  Family not at the bedside today  Disposition Plan: Discharge likely 02/07/2015.  IV access:  Peripheral IV  Procedures and diagnostic studies:    Ct Head Wo Contrast 02/04/2015  No acute intracranial pathology.   Electronically Signed   By: Elgie Collard M.D.   On: 02/04/2015 00:58   Dg Chest Portable 1 View 02/04/2015   Bronchial  thickening, may be chronic or represent acute bronchitis. Minimal linear atelectasis at the right lung base.   Electronically Signed   By: Rubye Oaks M.D.   On: 02/04/2015 03:11   EEG 02/04/2015 -  Normal awake and asleep EEG.  Medical Consultants:  Neurology - Dr. Wyatt Portela   Other Consultants:  Physical therapy  IAnti-Infectives:   Rocephin 02/04/2015 --> 02/05/2015 Bactrim 02/04/2015 --> 02/05/2015 Vancomycin 02/04/2015 --> 02/05/2015.   Manson Passey, MD  [facility] Pager [phone]  Time spent in minutes: 15 minutes  If 7PM-7AM, please contact night-coverage [email or web address] Password [facility] 02/06/2015, 12:19 PM   LOS: 2 days    HPI/Subjective: No acute overnight events. No nausea or vomiting.    Objective: Filed Vitals:   02/05/15 1507 02/05/15 2116 02/06/15 0525 02/06/15 0926  BP:  122/86 113/62   Pulse:  86 77   Temp:  98.6 F (37 C) 98.1 F (36.7 C)   TempSrc:  Oral Oral   Resp:  22 20   Height:      Weight:      SpO2: 98% 100% 100% 96%    Intake/Output Summary (Last 24 hours) at 02/06/15 1219 Last data filed at 02/06/15 0600  Gross per 24 hour  Intake   1300 ml  Output   3375 ml  Net  -2075 ml    Exam:   General:  Pt is not in acute distress  Cardiovascular: Rate controlled, (+) S1, S2  Respiratory: Bilateral air entry, no wheezing   Abdomen: nontender, nondistended abdomen, appreciate bowel sounds  Extremities: No  leg swelling, pulses palpable  Neuro: no focal deficits  Data Reviewed: Basic Metabolic Panel:  Recent Labs Lab 02/04/15 0109 02/05/15 0530  NA 136 140  K 4.4 4.1  CL 103 113*  CO2 19* 18*  GLUCOSE 227* 165*  BUN 38* 13  CREATININE 1.80* 1.13  CALCIUM 9.0 7.8*  MG 1.3*  --   PHOS 4.9*  --    Liver Function Tests:  Recent Labs Lab 02/04/15 0109 02/05/15 0530  AST 54* 28  ALT 31 24  ALKPHOS 73 55  BILITOT 0.6 0.5  PROT 7.3 6.0*  ALBUMIN 3.7 3.1*   No results for input(s): LIPASE, AMYLASE in  the last 168 hours.  Recent Labs Lab 02/04/15 0109  AMMONIA 34   CBC:  Recent Labs Lab 02/04/15 0109 02/05/15 0530  WBC 18.2* 9.5  NEUTROABS 13.0*  --   HGB 13.1 11.1*  HCT 38.8* 34.1*  MCV 81.2 81.8  PLT 257 244   Cardiac Enzymes:  Recent Labs Lab 02/04/15 0109  CKTOTAL 2223*  TROPONINI <0.03   BNP: Invalid input(s): POCBNP CBG:  Recent Labs Lab 02/05/15 0727 02/05/15 1203 02/05/15 1652 02/05/15 2118 02/06/15 0736  GLUCAP 229* 103* 116* 129* 129*    Recent Results (from the past 240 hour(s))  Urine culture     Status: None   Collection Time: 02/04/15 12:59 AM  Result Value Ref Range Status   Specimen Description URINE, CATHETERIZED  Final   Special Requests NONE  Final   Culture   Final    NO GROWTH 1 DAY Performed at [facility]    Report Status 02/05/2015 FINAL  Final  Blood Culture (routine x 2)     Status: None (Preliminary result)  Collection Time: 02/04/15  2:40 AM  Result Value Ref Range Status   Specimen Description BLOOD LEFT ANTECUBITAL  Final   Special Requests IN PEDIATRIC BOTTLE  Final   Culture   Final    NO GROWTH 1 DAY Performed at [facility]    Report Status PENDING  Incomplete  Blood Culture (routine x 2)     Status: None (Preliminary result)   Collection Time: 02/04/15  2:50 AM  Result Value Ref Range Status   Specimen Description BLOOD BLOOD LEFT ARM  Final   Special Requests BOTTLES DRAWN AEROBIC AND ANAEROBIC  Final   Culture   Final    NO GROWTH 1 DAY Performed at [facility]    Report Status PENDING  Incomplete  CSF culture     Status: None (Preliminary result)   Collection Time: 02/04/15  6:55 AM  Result Value Ref Range Status   Specimen Description CSF  Final   Special Requests Immunocompromised  Final   Gram Stain   Final    WBC PRESENT, PREDOMINANTLY MONONUCLEAR NO ORGANISMS SEEN Gram Stain Report Called to,Read Back By and Verified With: HASZ,S. RN @0918  ON 9.13.16 BY  MCCOY,N.    Culture   Final    NO GROWTH 2 DAYS Performed at [facility]    Report Status PENDING  Incomplete     Scheduled Meds: . atorvastatin  40 mg Oral Q breakfast  . cefTRIAXone (ROCEPHIN)  IV  2 g Intravenous Q12H  . chlorhexidine  15 mL Mouth/Throat BID  . cloZAPine  200 mg Oral BID  . divalproex  1,250 mg Oral QHS  . enoxaparin (LOVENOX) injection  40 mg Subcutaneous Q24H  . insulin aspart  0-5 Units Subcutaneous QHS  . insulin aspart  0-9 Units Subcutaneous TID WC  . linagliptin  5 mg Oral Daily  . LORazepam  0.5 mg Oral BID  . metoprolol tartrate  25 mg Oral BID  . QUEtiapine  100 mg Oral BID  . QUEtiapine  75 mg Oral BID  . tiotropium  and  18 mcg Inhalation Daily  . verapamil  120 mg Oral QHS

## 2015-02-06 NOTE — Progress Notes (Signed)
Hard to wake up patient this morning. He would fall immediately back to sleep after arousal. He did not wake up enough to eat breakfast until 1400. Notified MD. MD modified medication orders.

## 2015-02-06 NOTE — Progress Notes (Signed)
Clinical Social Work  CSW spoke with RN who reports patient has been sedated and drowsy all morning. RN wanted psych MD to be aware in case medications needed to be adjusted. CSW updated psych MD and will continue to follow.  [place], [name] [phone]

## 2015-02-07 LAB — CSF CULTURE W GRAM STAIN: Culture: NO GROWTH

## 2015-02-07 LAB — CK: Total CK: 295 U/L (ref 49–397)

## 2015-02-07 LAB — GLUCOSE, CAPILLARY
GLUCOSE-CAPILLARY: 139 mg/dL — AB (ref 65–99)
GLUCOSE-CAPILLARY: 139 mg/dL — AB (ref 65–99)

## 2015-02-07 MED ORDER — QUETIAPINE FUMARATE 50 MG PO TABS: 50.0000 mg | ORAL_TABLET | Freq: Two times a day (BID) | ORAL | Status: DC

## 2015-02-07 MED ORDER — CLOZAPINE 100 MG PO TABS: 100.0000 mg | ORAL_TABLET | Freq: Two times a day (BID) | ORAL | Status: DC

## 2015-02-07 NOTE — Discharge Summary (Addendum)
Physician Discharge Summary  Ronald Roman:[id number] DOB: 05-20-1961 DOA: 02/03/2015  PCP: Vinnie Langton, NP   Admit date: 02/03/2015 Discharge date: 02/07/2015  Recommendations for Outpatient Follow-up:  1. Please note we have refused clonazepam to 100 mg twice daily instead of 200 mg twice daily. We also reduced Zoloft down to 50 mg twice daily. 2. Patient may continue metformin and Januvia on discharge. 3. Please note we stopped HCTZ and lisinopril because it may have contributed to worsening kidney function. Kidney function has normalized prior to discharge however because the blood pressure is stable with metoprolol and verapamil it is okay for now to hold HCTZ and lisinopril until patient is seen by primary care physician who can decide if those 2 meds needs to be added back.  Discharge Diagnoses:  Principal Problem:   Paranoid schizophrenia Active Problems:   Acute encephalopathy   Diabetes mellitus without complication   Hyperlipidemia   Hypertension   AKI (acute kidney injury)   Seizure disorder   Sleep apnea   Leukocytosis    Discharge Condition: stable   Diet recommendation: as tolerated   History of present illness:  54 year old male, from group home, past medical history of paranoid schizophrenia, possible seizure disorder, diabetes, hypertension, dyslipidemia who presented to [facility] 02/03/2015 with worsening mental status changes. Patient is not able to provide details of the medical history. Per ED physician, the residential facility was unable to locate the patient the entire day. When he was finally located on Monday he was noted to inappropriately laugh with answering questions incoherently. Later on he was found lying on the floor of the porch covered in feces, not responding to verbal or painful stimuli.  In ED, sepsis protocol was initiated. A lumbar puncture was performed and patient was started on empiric treatment with vancomycin,  Rocephin and Bactrim for possible meningitis. CT head and CXR showed no acute findings. Neurology has seen the patient in consultation. Per neurology, CSF did not look infectious so all antibiotics stopped 02/05/2015.   Additionally, psychiatry has adjusted clonazepam and Zoloft.  Hospital Course:    Assessment/Plan:    Principal Problem:  Acute encephalopathy / Paranoid schizophrenia / Leukocytosis - Unclear etiology although clonazepam and Zoloft dose that patient was taking may have been too strong for him. Psychiatry has adjusted clonazepam 200 mg twice daily and Zoloft 50 mg twice daily. Patient more alert this morning. - Initial thought was that patient has possible seizure but EEG did not show active seizures. - It was also thought that there may be a possibility for bacterial meningitis and patient was started on Rocephin and vancomycin and Bactrim. After lumbar puncture done on the admission and analysis of CSF it was clear that fluid did not look infectious so per neurology all antibody stopped 02/05/2015. - MRI of the brain did not show acute intracranial findings. CT head did not show acute intracranial findings. - Off note, ammonia level was within normal limits on the admission   Active Problems:  Diabetes mellitus without complication - Patient will continue metformin and Januvia on discharge. Metformin was initially placed on hold because of acute renal failure but his kidney function has improved so it is okay to continue metformin.   Hyperlipidemia - Continue Lipitor 40 mg daily on discharge   Hypertension, Essential -- Patient will continue metoprolol and verapamil on discharge - Would continue to hold HCTZ and lisinopril because blood pressure is adequately controlled with metoprolol and verapamil alone. - HCTZ  and lisinopril were placed on hold on the admission because of acute renal failure.   AKI (acute kidney injury) - Patient was on 3 nephrotoxic  medications, lisinopril, HCTZ and metformin. Those medications were placed on hold on the admission. A renal function normalizes. - On discharge, patient will resume metformin but lisinopril and HCTZ will remain on hold since blood pressure is adequately controlled   Seizure disorder - EEG did not demonstrate active seizures. - May continue Depakote on discharge. No seizures in hospital.   Sleep apnea - Stable  - Continue Spiriva.   DVT Prophylaxis  - Lovenox subQ    Code Status: Full.  Family Communication: Family not at the bedside today    IV access:  Peripheral IV  Procedures and diagnostic studies:   Ct Head Wo Contrast 02/04/2015 No acute intracranial pathology. Electronically Signed By: Elgie Collard M.D. On: 02/04/2015 00:58   Dg Chest Portable 1 View 02/04/2015 Bronchial thickening, may be chronic or represent acute bronchitis. Minimal linear atelectasis at the right lung base. Electronically Signed By: Rubye Oaks M.D. On: 02/04/2015 03:11   EEG 02/04/2015 - Normal awake and asleep EEG.  Medical Consultants:  Neurology - Dr. Wyatt Portela   Other Consultants:  Physical therapy  IAnti-Infectives:   Rocephin 02/04/2015 --> 02/05/2015 Bactrim 02/04/2015 --> 02/05/2015 Vancomycin 02/04/2015 --> 02/05/2015.     SignedManson Passey, MD  [facility] 02/07/2015, 10:22 AM  Pager #: [phone]  Time spent in minutes: more than 30 minutes   Discharge Exam: Filed Vitals:   02/07/15 0642  BP: 108/64  Pulse: 83  Temp: 98.3 F (36.8 C)  Resp: 20   Filed Vitals:   02/06/15 0926 02/06/15 1525 02/07/15 0642 02/07/15 0828  BP:  121/70 108/64   Pulse:  88 83   Temp:  98.3 F (36.8 C) 98.3 F (36.8 C)   TempSrc:  Oral Oral   Resp:  20 20   Height:      Weight:      SpO2: 96% 100% 96% 99%    General: Pt is alert, not in acute distress Cardiovascular: Regular rate and rhythm, S1/S2 + Respiratory:  Clear to auscultation bilaterally, no wheezing, no crackles, no rhonchi Abdominal: Soft, non tender, non distended, bowel sounds +, no guarding Extremities: no edema, no cyanosis, pulses palpable bilaterally DP and PT Neuro: Grossly nonfocal  Discharge Instructions  Discharge Instructions    Call MD for:  difficulty breathing, headache or visual disturbances    Complete by:  As directed      Call MD for:  persistant nausea and vomiting    Complete by:  As directed      Call MD for:  severe uncontrolled pain    Complete by:  As directed      Diet - low sodium heart healthy    Complete by:  As directed      Discharge instructions    Complete by:  As directed   1. Please note we have refused clonazepam to 100 mg twice daily instead of 200 mg twice daily. We also reduced Zoloft down to 50 mg twice daily. 2. Patient may continue metformin and Januvia on discharge. 3. Please note we stopped HCTZ and lisinopril because it may have contributed to worsening kidney function. Kidney function has normalized prior to discharge however because the blood pressure is stable with metoprolol and verapamil it is okay for now to hold HCTZ and lisinopril until patient is seen by primary care physician who  can decide if those 2 meds needs to be added back.     Increase activity slowly    Complete by:  As directed             Medication List    STOP taking these medications        hydrochlorothiazide 25 MG tablet  Commonly known as:  HYDRODIURIL     lisinopril 10 MG tablet  Commonly known as:  PRINIVIL,ZESTRIL      TAKE these medications        albuterol 108 (90 BASE) MCG/ACT inhaler  Commonly known as:  PROVENTIL HFA;VENTOLIN HFA  Inhale 2 puffs into the lungs every 4 (four) hours as needed for shortness of breath.     atorvastatin 40 MG tablet  Commonly known as:  LIPITOR  Take 40 mg by mouth daily with breakfast.     cloZAPine 100 MG tablet  Commonly known as:  CLOZARIL  Take 1 tablet  (100 mg total) by mouth 2 (two) times daily.     divalproex 500 MG DR tablet  Commonly known as:  DEPAKOTE  Take 1,000 mg by mouth at bedtime.     divalproex 250 MG DR tablet  Commonly known as:  DEPAKOTE  Take 250 mg by mouth at bedtime.     LORazepam 0.5 MG tablet  Commonly known as:  ATIVAN  Take 0.5 mg by mouth 2 (two) times daily.     magnesium oxide 400 MG tablet  Commonly known as:  MAG-OX  Take 400 mg by mouth 2 (two) times daily.     metFORMIN 1000 MG tablet  Commonly known as:  GLUCOPHAGE  Take 1,000 mg by mouth 2 (two) times daily with a meal.     metoprolol tartrate 25 MG tablet  Commonly known as:  LOPRESSOR  Take 25 mg by mouth 2 (two) times daily.     QUEtiapine 50 MG tablet  Commonly known as:  SEROQUEL  Take 1 tablet (50 mg total) by mouth 2 (two) times daily.     ranitidine 150 MG tablet  Commonly known as:  ZANTAC  Take 150 mg by mouth daily with breakfast.     SENEXON-S 8.6-50 MG per tablet  Generic drug:  senna-docusate  Take 1 tablet by mouth 2 (two) times daily.     sitaGLIPtin 100 MG tablet  Commonly known as:  JANUVIA  Take 100 mg by mouth daily with breakfast.     tiotropium 18 MCG inhalation capsule  Commonly known as:  SPIRIVA  Place 18 mcg into inhaler and inhale daily.     verapamil 120 MG tablet  Commonly known as:  CALAN  Take 120 mg by mouth at bedtime.           Follow-up Information    Follow up with Vinnie Langton, NP. Schedule an appointment as soon as possible for a visit in 1 week.   Specialty:  Nurse Practitioner   Why:  Follow up appt after recent hospitalization   Contact information:   [address] [name] [address] [phone]        The results of significant diagnostics from this hospitalization (including imaging, microbiology, ancillary and laboratory) are listed below for reference.    Significant Diagnostic Studies: Ct Head Wo Contrast  02/04/2015   CLINICAL DATA:  54 year old male with  altered mental status  EXAM: CT HEAD WITHOUT CONTRAST  TECHNIQUE: Contiguous axial images were obtained from the base of the skull through the vertex  without intravenous contrast.  COMPARISON:  CT dated 03/06/2012  FINDINGS: The ventricles and the sulci are appropriate in size for the patient's age. There is no intracranial hemorrhage. No midline shift or mass effect identified. The gray-white matter differentiation is preserved.  The visualized paranasal sinuses and mastoid air cells are well aerated. The calvarium is intact. There is soft tissue thickening and scarring of the posterior scalp similar prior study.  IMPRESSION: No acute intracranial pathology.   Electronically Signed   By: Elgie Collard M.D.   On: 02/04/2015 00:58   Mr Brain Wo Contrast  02/06/2015   CLINICAL DATA:  Altered mental status, possible medication induced drowsiness. History of paranoid schizophrenia, diabetes, hypertension.  EXAM: MRI HEAD WITHOUT CONTRAST  TECHNIQUE: Coronal and axial T2 view wide, axial T2 FLAIR, T2 and axial MPGR, sagittal T1. Patient was unable to complete examination, technologist reports difficulty breathing.  COMPARISON:  CT head February 04, 2015  FINDINGS: Moderately motion degraded examination.  The ventricles and sulci are mildly prominent for age. No abnormal parenchymal signal, mass lesions, mass effect. No reduced diffusion to suggest acute ischemia. No susceptibility artifact to suggest hemorrhage.  No abnormal extra-axial fluid collections. No extra-axial masses though, contrast enhanced sequences would be more sensitive. Normal major intracranial vascular flow voids seen at the skull base.  Ocular globes and orbital contents are unremarkable though not tailored for evaluation. No abnormal sellar expansion. Visualized paranasal sinuses and mastoid air cells are well-aerated. No suspicious calvarial bone marrow signal. No abnormal sellar expansion. Craniocervical junction maintained.  IMPRESSION:  Limited, motion degraded MRI of brain without acute intracranial process.  Mild global parenchymal brain volume loss for age.   Electronically Signed   By: Awilda Metro M.D.   On: 02/06/2015 21:56   Dg Chest Portable 1 View  02/04/2015   CLINICAL DATA:  Sepsis, altered mental status.  EXAM: PORTABLE CHEST - 1 VIEW  COMPARISON:  01/12/2013  FINDINGS: The cardiomediastinal contours are normal. There is bronchial thickening. Minimal linear atelectasis at the right lung base. No consolidation, pleural effusion, or pneumothorax. No acute osseous abnormalities are seen.  IMPRESSION: Bronchial thickening, may be chronic or represent acute bronchitis. Minimal linear atelectasis at the right lung base.   Electronically Signed   By: Rubye Oaks M.D.   On: 02/04/2015 03:11    Microbiology: Recent Results (from the past 240 hour(s))  Urine culture     Status: None   Collection Time: 02/04/15 12:59 AM  Result Value Ref Range Status   Specimen Description URINE, CATHETERIZED  Final   Special Requests NONE  Final   Culture   Final    NO GROWTH 1 DAY Performed at [facility]    Report Status 02/05/2015 FINAL  Final  Blood Culture (routine x 2)     Status: None (Preliminary result)   Collection Time: 02/04/15  2:40 AM  Result Value Ref Range Status   Specimen Description BLOOD LEFT ANTECUBITAL  Final   Special Requests IN PEDIATRIC BOTTLE  Final   Culture   Final    NO GROWTH 2 DAYS Performed at [facility]    Report Status PENDING  Incomplete  Blood Culture (routine x 2)     Status: None (Preliminary result)   Collection Time: 02/04/15  2:50 AM  Result Value Ref Range Status   Specimen Description BLOOD BLOOD LEFT ARM  Final   Special Requests BOTTLES DRAWN AEROBIC AND ANAEROBIC  Final   Culture  Final    NO GROWTH 2 DAYS Performed at [facility]    Report Status PENDING  Incomplete  CSF culture     Status: None (Preliminary result)   Collection  Time: 02/04/15  6:55 AM  Result Value Ref Range Status   Specimen Description CSF  Final   Special Requests Immunocompromised  Final   Gram Stain   Final    WBC PRESENT, PREDOMINANTLY MONONUCLEAR NO ORGANISMS SEEN Gram Stain Report Called to,Read Back By and Verified With: HASZ,S. RN @0918  ON 9.13.16 BY MCCOY,N.    Culture   Final    NO GROWTH 2 DAYS Performed at [facility]    Report Status PENDING  Incomplete     Labs: Basic Metabolic Panel:  Recent Labs Lab 02/04/15 0109 02/05/15 0530  NA 136 140  K 4.4 4.1  CL 103 113*  CO2 19* 18*  GLUCOSE 227* 165*  BUN 38* 13  CREATININE 1.80* 1.13  CALCIUM 9.0 7.8*  MG 1.3*  --   PHOS 4.9*  --    Liver Function Tests:  Recent Labs Lab 02/04/15 0109 02/05/15 0530  AST 54* 28  ALT 31 24  ALKPHOS 73 55  BILITOT 0.6 0.5  PROT 7.3 6.0*  ALBUMIN 3.7 3.1*   No results for input(s): LIPASE, AMYLASE in the last 168 hours.  Recent Labs Lab 02/04/15 0109  AMMONIA 34   CBC:  Recent Labs Lab 02/04/15 0109 02/05/15 0530  WBC 18.2* 9.5  NEUTROABS 13.0*  --   HGB 13.1 11.1*  HCT 38.8* 34.1*  MCV 81.2 81.8  PLT 257 244   Cardiac Enzymes:  Recent Labs Lab 02/04/15 0109  CKTOTAL 2223*  TROPONINI <0.03   BNP: BNP (last 3 results) No results for input(s): BNP in the last 8760 hours.  ProBNP (last 3 results) No results for input(s): PROBNP in the last 8760 hours.  CBG:  Recent Labs Lab 02/06/15 0736 02/06/15 1228 02/06/15 1722 02/06/15 2229 02/07/15 0809  GLUCAP 129* 121* 104* 108* 139*

## 2015-02-07 NOTE — Progress Notes (Signed)
[facility] aware of [facility] orders/Rhonda davis,RN,BSN,CCM

## 2015-02-07 NOTE — Progress Notes (Signed)
Physical Therapy Treatment Patient Details Name: Ronald Roman MRN: [id number] DOB: 1961/02/22 Today's Date: Mar 02, 2015    History of Present Illness 54 year old male, from group home, past medical history of paranoid schizophrenia, possible seizure disorder, diabetes, hypertension, dyslipidemia who presented to [facility] 02/03/2015 with worsening mental status changes    PT Comments    Pt much more alert and functional.  OOB in recliner.  Amb a great distance in hallway.  Plans to D/C today. Appears at prior level of function and mobility.  Follow Up Recommendations  Home health PT     Equipment Recommendations       Recommendations for Other Services       Precautions / Restrictions Precautions Precautions: Fall Restrictions Weight Bearing Restrictions: No    Mobility  Bed Mobility               General bed mobility comments: Pt  OOB in recliner  Transfers Overall transfer level: Modified independent               General transfer comment: good use of hands to steady self  Ambulation/Gait Ambulation/Gait assistance: Supervision Ambulation Distance (Feet): 185 Feet Assistive device: None;1 person hand held assist Gait Pattern/deviations: Step-through pattern;Decreased stride length;Wide base of support     General Gait Details: good alternating functional gait with no LOB.  Appears ar prior level.     Stairs            Wheelchair Mobility    Modified Rankin (Stroke Patients Only)       Balance                                    Cognition Arousal/Alertness: Awake/alert   Overall Cognitive Status: Within Functional Limits for tasks assessed                 General Comments: appears at prior level    Exercises      General Comments        Pertinent Vitals/Pain Pain Assessment: No/denies pain    Home Living                      Prior Function            PT Goals (current  goals can now be found in the care plan section) Progress towards PT goals: Progressing toward goals    Frequency  Min 3X/week    PT Plan      Co-evaluation             End of Session Equipment Utilized During Treatment: Gait belt Activity Tolerance: Patient tolerated treatment well Patient left: in chair;with call bell/phone within reach     Time: 1135-1148 PT Time Calculation (min) (ACUTE ONLY): 13 min  Charges:  $Gait Training: 8-22 mins                    G Codes:      Armando Reichert March 02, 2015, 1:10 PM

## 2015-02-07 NOTE — Discharge Instructions (Signed)
Sertraline tablets What is this medicine? SERTRALINE (SER tra leen) is used to treat depression. It may also be used to treat obsessive compulsive disorder, panic disorder, post-trauma stress, premenstrual dysphoric disorder (PMDD) or social anxiety. This medicine may be used for other purposes; ask your health care provider or pharmacist if you have questions. COMMON BRAND NAME(S): Zoloft What should I tell my health care provider before I take this medicine? They need to know if you have any of these conditions: -bipolar disorder or a family history of bipolar disorder -diabetes -glaucoma -heart disease -high blood pressure -history of irregular heartbeat -history of low levels of calcium, magnesium, or potassium in the blood -if you often drink alcohol -liver disease -receiving electroconvulsive therapy -seizures -suicidal thoughts, plans, or attempt; a previous suicide attempt by you or a family member -thyroid disease -an unusual or allergic reaction to sertraline, other medicines, foods, dyes, or preservatives -pregnant or trying to get pregnant -breast-feeding How should I use this medicine? Take this medicine by mouth with a glass of water. Follow the directions on the prescription label. You can take it with or without food. Take your medicine at regular intervals. Do not take your medicine more often than directed. Do not stop taking this medicine suddenly except upon the advice of your doctor. Stopping this medicine too quickly may cause serious side effects or your condition may worsen. A special MedGuide will be given to you by the pharmacist with each prescription and refill. Be sure to read this information carefully each time. Talk to your pediatrician regarding the use of this medicine in children. While this drug may be prescribed for children as young as 7 years for selected conditions, precautions do apply. Overdosage: If you think you have taken too much of this  medicine contact a poison control center or emergency room at once. NOTE: This medicine is only for you. Do not share this medicine with others. What if I miss a dose? If you miss a dose, take it as soon as you can. If it is almost time for your next dose, take only that dose. Do not take double or extra doses. What may interact with this medicine? Do not take this medicine with any of the following medications: -certain medicines for fungal infections like fluconazole, itraconazole, ketoconazole, posaconazole, voriconazole -cisapride -disulfiram -dofetilide -linezolid -MAOIs like Carbex, Eldepryl, Marplan, Nardil, and Parnate -metronidazole -methylene blue (injected into a vein) -pimozide -thioridazine -ziprasidone This medicine may also interact with the following medications: -alcohol -aspirin and aspirin-like medicines -certain medicines for depression, anxiety, or psychotic disturbances -certain medicines for irregular heart beat like flecainide, propafenone -certain medicines for migraine headaches like almotriptan, eletriptan, frovatriptan, naratriptan, rizatriptan, sumatriptan, zolmitriptan -certain medicines for sleep -certain medicines for seizures like carbamazepine, valproic acid, phenytoin -certain medicines that treat or prevent blood clots like warfarin, enoxaparin, dalteparin -cimetidine -digoxin -diuretics -fentanyl -furazolidone -isoniazid -lithium -NSAIDs, medicines for pain and inflammation, like ibuprofen or naproxen -other medicines that prolong the QT interval (cause an abnormal heart rhythm) -procarbazine -rasagiline -supplements like St. John's wort, kava kava, valerian -tolbutamide -tramadol -tryptophan This list may not describe all possible interactions. Give your health care provider a list of all the medicines, herbs, non-prescription drugs, or dietary supplements you use. Also tell them if you smoke, drink alcohol, or use illegal drugs. Some  items may interact with your medicine. What should I watch for while using this medicine? Tell your doctor if your symptoms do not get better or if they get  worse. Visit your doctor or health care professional for regular checks on your progress. Because it may take several weeks to see the full effects of this medicine, it is important to continue your treatment as prescribed by your doctor. Patients and their families should watch out for new or worsening thoughts of suicide or depression. Also watch out for sudden changes in feelings such as feeling anxious, agitated, panicky, irritable, hostile, aggressive, impulsive, severely restless, overly excited and hyperactive, or not being able to sleep. If this happens, especially at the beginning of treatment or after a change in dose, call your health care professional. Bonita Quin may get drowsy or dizzy. Do not drive, use machinery, or do anything that needs mental alertness until you know how this medicine affects you. Do not stand or sit up quickly, especially if you are an older patient. This reduces the risk of dizzy or fainting spells. Alcohol may interfere with the effect of this medicine. Avoid alcoholic drinks. Your mouth may get dry. Chewing sugarless gum or sucking hard candy, and drinking plenty of water may help. Contact your doctor if the problem does not go away or is severe. What side effects may I notice from receiving this medicine? Side effects that you should report to your doctor or health care professional as soon as possible: -allergic reactions like skin rash, itching or hives, swelling of the face, lips, or tongue -black or bloody stools, blood in the urine or vomit -fast, irregular heartbeat -feeling faint or lightheaded, falls -hallucination, loss of contact with reality -seizures -suicidal thoughts or other mood changes -unusual bleeding or bruising -unusually weak or tired -vomiting Side effects that usually do not require  medical attention (report to your doctor or health care professional if they continue or are bothersome): -change in appetite -change in sex drive or performance -diarrhea -increased sweating -indigestion, nausea -tremors This list may not describe all possible side effects. Call your doctor for medical advice about side effects. You may report side effects to FDA at 1-800-FDA-1088. Where should I keep my medicine? Keep out of the reach of children. Store at room temperature between 15 and 30 degrees C (59 and 86 degrees F). Throw away any unused medicine after the expiration date. NOTE: This sheet is a summary. It may not cover all possible information. If you have questions about this medicine, talk to your doctor, pharmacist, or health care provider.  2015, Elsevier/Gold Standard. (2012-12-05 12:57:35) Clonazepam tablets What is this medicine? CLONAZEPAM (kloe NA ze pam) is a benzodiazepine. It is used to treat certain types of seizures. It is also used to treat panic disorder. This medicine may be used for other purposes; ask your health care provider or pharmacist if you have questions. COMMON BRAND NAME(S): Ceberclon, Klonopin What should I tell my health care provider before I take this medicine? They need to know if you have any of these conditions: -an alcohol or drug abuse problem -bipolar disorder, depression, psychosis or other mental health condition -glaucoma -kidney or liver disease -lung or breathing disease -myasthenia gravis -Parkinson's disease -seizures or a history of seizures -suicidal thoughts -an unusual or allergic reaction to clonazepam, other benzodiazepines, foods, dyes, or preservatives -pregnant or trying to get pregnant -breast-feeding How should I use this medicine? Take this medicine by mouth with a glass of water. Follow the directions on the prescription label. If it upsets your stomach, take it with food or milk. Take your medicine at regular  intervals. Do not take it more  often than directed. Do not stop taking or change the dose except on the advice of your doctor or health care professional. A special MedGuide will be given to you by the pharmacist with each prescription and refill. Be sure to read this information carefully each time. Talk to your pediatrician regarding the use of this medicine in children. Special care may be needed. Overdosage: If you think you have taken too much of this medicine contact a poison control center or emergency room at once. NOTE: This medicine is only for you. Do not share this medicine with others. What if I miss a dose? If you miss a dose, take it as soon as you can. If it is almost time for your next dose, take only that dose. Do not take double or extra doses. What may interact with this medicine? -herbal or dietary supplements -medicines for depression, anxiety, or psychotic disturbances -medicines for fungal infections like fluconazole, itraconazole, ketoconazole, voriconazole -medicines for HIV infection or AIDS -medicines for sleep -prescription pain medicines -propantheline -rifampin -sevelamer -some medicines for seizures like carbamazepine, phenobarbital, phenytoin, primidone This list may not describe all possible interactions. Give your health care provider a list of all the medicines, herbs, non-prescription drugs, or dietary supplements you use. Also tell them if you smoke, drink alcohol, or use illegal drugs. Some items may interact with your medicine. What should I watch for while using this medicine? Visit your doctor or health care professional for regular checks on your progress. Your body may become dependent on this medicine. If you have been taking this medicine regularly for some time, do not suddenly stop taking it. You must gradually reduce the dose or you may get severe side effects. Ask your doctor or health care professional for advice before increasing or decreasing  the dose. Even after you stop taking this medicine it can still affect your body for several days. If you suffer from several types of seizures, this medicine may increase the chance of grand mal seizures (epilepsy). Let your doctor or health care professional know, he or she may want to prescribe an additional medicine. You may get drowsy or dizzy. Do not drive, use machinery, or do anything that needs mental alertness until you know how this medicine affects you. To reduce the risk of dizzy and fainting spells, do not stand or sit up quickly, especially if you are an older patient. Alcohol may increase dizziness and drowsiness. Avoid alcoholic drinks. Do not treat yourself for coughs, colds or allergies without asking your doctor or health care professional for advice. Some ingredients can increase possible side effects. The use of this medicine may increase the chance of suicidal thoughts or actions. Pay special attention to how you are responding while on this medicine. Any worsening of mood, or thoughts of suicide or dying should be reported to your health care professional right away. Women who become pregnant while using this medicine may enroll in the [place] Antiepileptic Drug Pregnancy Registry by calling [phone]. This registry collects information about the safety of antiepileptic drug use during pregnancy. What side effects may I notice from receiving this medicine? Side effects that you should report to your doctor or health care professional as soon as possible: -allergic reactions like skin rash, itching or hives, swelling of the face, lips, or tongue -changes in vision -confusion -depression -hallucinations -mood changes, excitability or aggressive behavior -movement difficulty, staggering or jerky movements -muscle cramps, weakness -tremors -unusual eye movements Side effects that usually do not  require medical attention (report to your doctor or health care  professional if they continue or are bothersome): -constipation or diarrhea -difficulty sleeping, nightmares -dizziness, drowsiness -headache -increased saliva from your mouth -nausea, vomiting This list may not describe all possible side effects. Call your doctor for medical advice about side effects. You may report side effects to FDA at 1-800-FDA-1088. Where should I keep my medicine? Keep out of the reach of children. This medicine can be abused. Keep your medicine in a safe place to protect it from theft. Do not share this medicine with anyone. Selling or giving away this medicine is dangerous and against the law. Store at room temperature between 15 and 30 degrees C (59 and 86 degrees F). Protect from light. Keep container tightly closed. Throw away any unused medicine after the expiration date. NOTE: This sheet is a summary. It may not cover all possible information. If you have questions about this medicine, talk to your doctor, pharmacist, or health care provider.  2015, Elsevier/Gold Standard. (2009-04-04 19:16:36)

## 2015-02-07 NOTE — Progress Notes (Signed)
Discharge instructions given to pt/ caregiver, verbalized understanding. Left the unit in stable condition. 

## 2015-02-09 LAB — CULTURE, BLOOD (ROUTINE X 2)
CULTURE: NO GROWTH
CULTURE: NO GROWTH

## 2015-03-21 ENCOUNTER — Emergency Department (HOSPITAL_COMMUNITY)
Admission: EM | Admit: 2015-03-21 | Discharge: 2015-03-27 | Disposition: A | Discharge status: Home / Self Care | Payer: Medicaid Other | Attending: Emergency Medicine | Admitting: Emergency Medicine

## 2015-03-21 ENCOUNTER — Encounter (HOSPITAL_COMMUNITY): Payer: Self-pay | Admitting: Emergency Medicine

## 2015-03-21 DIAGNOSIS — E119 Type 2 diabetes mellitus without complications: Secondary | ICD-10-CM | POA: Insufficient documentation

## 2015-03-21 DIAGNOSIS — Z8669 Personal history of other diseases of the nervous system and sense organs: Secondary | ICD-10-CM | POA: Diagnosis not present

## 2015-03-21 DIAGNOSIS — K219 Gastro-esophageal reflux disease without esophagitis: Secondary | ICD-10-CM | POA: Diagnosis not present

## 2015-03-21 DIAGNOSIS — Z87442 Personal history of urinary calculi: Secondary | ICD-10-CM | POA: Insufficient documentation

## 2015-03-21 DIAGNOSIS — Z79899 Other long term (current) drug therapy: Secondary | ICD-10-CM | POA: Diagnosis not present

## 2015-03-21 DIAGNOSIS — R45851 Suicidal ideations: Secondary | ICD-10-CM | POA: Diagnosis not present

## 2015-03-21 DIAGNOSIS — Z880 Allergy status to penicillin: Secondary | ICD-10-CM | POA: Diagnosis not present

## 2015-03-21 DIAGNOSIS — Z720 Tobacco use: Secondary | ICD-10-CM | POA: Diagnosis not present

## 2015-03-21 DIAGNOSIS — F23 Brief psychotic disorder: Secondary | ICD-10-CM | POA: Insufficient documentation

## 2015-03-21 DIAGNOSIS — E785 Hyperlipidemia, unspecified: Secondary | ICD-10-CM | POA: Diagnosis not present

## 2015-03-21 DIAGNOSIS — F200 Paranoid schizophrenia: Secondary | ICD-10-CM | POA: Diagnosis present

## 2015-03-21 DIAGNOSIS — I10 Essential (primary) hypertension: Secondary | ICD-10-CM | POA: Diagnosis not present

## 2015-03-21 DIAGNOSIS — F419 Anxiety disorder, unspecified: Secondary | ICD-10-CM | POA: Insufficient documentation

## 2015-03-21 DIAGNOSIS — Z008 Encounter for other general examination: Secondary | ICD-10-CM | POA: Diagnosis present

## 2015-03-21 DIAGNOSIS — F29 Unspecified psychosis not due to a substance or known physiological condition: Secondary | ICD-10-CM | POA: Diagnosis not present

## 2015-03-21 LAB — CBG MONITORING, ED
GLUCOSE-CAPILLARY: 170 mg/dL — AB (ref 65–99)
Glucose-Capillary: 175 mg/dL — ABNORMAL HIGH (ref 65–99)
Glucose-Capillary: 180 mg/dL — ABNORMAL HIGH (ref 65–99)

## 2015-03-21 LAB — DIFFERENTIAL
Basophils Absolute: 0 10*3/uL (ref 0.0–0.1)
Basophils Relative: 0 %
EOS PCT: 1 %
Eosinophils Absolute: 0.1 10*3/uL (ref 0.0–0.7)
LYMPHS ABS: 2.7 10*3/uL (ref 0.7–4.0)
LYMPHS PCT: 18 %
MONO ABS: 1.2 10*3/uL — AB (ref 0.1–1.0)
MONOS PCT: 8 %
Neutro Abs: 11 10*3/uL — ABNORMAL HIGH (ref 1.7–7.7)
Neutrophils Relative %: 73 %

## 2015-03-21 LAB — COMPREHENSIVE METABOLIC PANEL
ALK PHOS: 98 U/L (ref 38–126)
ALT: 40 U/L (ref 17–63)
ANION GAP: 9 (ref 5–15)
AST: 52 U/L — AB (ref 15–41)
Albumin: 4 g/dL (ref 3.5–5.0)
BILIRUBIN TOTAL: 0.7 mg/dL (ref 0.3–1.2)
BUN: 23 mg/dL — ABNORMAL HIGH (ref 6–20)
CALCIUM: 9 mg/dL (ref 8.9–10.3)
CHLORIDE: 103 mmol/L (ref 101–111)
CO2: 23 mmol/L (ref 22–32)
Creatinine, Ser: 1.11 mg/dL (ref 0.61–1.24)
GFR calc Af Amer: >60 mL/min (ref >60)
Glucose, Bld: 190 mg/dL — ABNORMAL HIGH (ref 65–99)
Potassium: 3.9 mmol/L (ref 3.5–5.1)
Sodium: 135 mmol/L (ref 135–145)
TOTAL PROTEIN: 7.6 g/dL (ref 6.5–8.1)

## 2015-03-21 LAB — CBC
HCT: 38.1 % — ABNORMAL LOW (ref 39.0–52.0)
HEMOGLOBIN: 12.5 g/dL — AB (ref 13.0–17.0)
MCH: 26.4 pg (ref 26.0–34.0)
MCHC: 32.8 g/dL (ref 30.0–36.0)
MCV: 80.5 fL (ref 78.0–100.0)
PLATELETS: 403 10*3/uL — AB (ref 150–400)
RBC: 4.73 MIL/uL (ref 4.22–5.81)
RDW: 14.9 % (ref 11.5–15.5)
WBC: 15.5 10*3/uL — ABNORMAL HIGH (ref 4.0–10.5)

## 2015-03-21 LAB — ETHANOL

## 2015-03-21 LAB — SALICYLATE LEVEL

## 2015-03-21 LAB — ACETAMINOPHEN LEVEL

## 2015-03-21 MED ORDER — METOPROLOL TARTRATE 25 MG PO TABS
25.0000 mg | ORAL_TABLET | Freq: Two times a day (BID) | ORAL | Status: DC
  Administered 2015-03-21 – 2015-03-27 (×12): 25 mg via ORAL
  Filled 2015-03-21 (×12): qty 1

## 2015-03-21 MED ORDER — ONDANSETRON HCL 4 MG PO TABS: 4.0000 mg | ORAL_TABLET | Freq: Three times a day (TID) | ORAL | Status: DC | PRN

## 2015-03-21 MED ORDER — ACETAMINOPHEN 325 MG PO TABS: 650.0000 mg | ORAL_TABLET | ORAL | Status: DC | PRN

## 2015-03-21 MED ORDER — IBUPROFEN 200 MG PO TABS: 600.0000 mg | ORAL_TABLET | Freq: Three times a day (TID) | ORAL | Status: DC | PRN

## 2015-03-21 MED ORDER — QUETIAPINE FUMARATE 50 MG PO TABS
50.0000 mg | ORAL_TABLET | Freq: Two times a day (BID) | ORAL | Status: DC
  Administered 2015-03-21 – 2015-03-24 (×6): 50 mg via ORAL
  Filled 2015-03-21 (×6): qty 1

## 2015-03-21 MED ORDER — ATORVASTATIN CALCIUM 40 MG PO TABS
40.0000 mg | ORAL_TABLET | Freq: Every day | ORAL | Status: DC
  Administered 2015-03-22 – 2015-03-26 (×5): 40 mg via ORAL
  Filled 2015-03-21 (×6): qty 1

## 2015-03-21 MED ORDER — DIVALPROEX SODIUM 250 MG PO DR TAB
250.0000 mg | DELAYED_RELEASE_TABLET | Freq: Every day | ORAL | Status: DC
  Administered 2015-03-21 – 2015-03-26 (×6): 250 mg via ORAL
  Filled 2015-03-21 (×6): qty 1

## 2015-03-21 MED ORDER — CLOZAPINE 100 MG PO TABS
100.0000 mg | ORAL_TABLET | Freq: Two times a day (BID) | ORAL | Status: DC
  Administered 2015-03-21 – 2015-03-27 (×12): 100 mg via ORAL
  Filled 2015-03-21 (×16): qty 1

## 2015-03-21 MED ORDER — LORAZEPAM 0.5 MG PO TABS
0.5000 mg | ORAL_TABLET | Freq: Two times a day (BID) | ORAL | Status: DC
  Administered 2015-03-21 – 2015-03-27 (×12): 0.5 mg via ORAL
  Filled 2015-03-21 (×12): qty 1

## 2015-03-21 MED ORDER — NICOTINE 21 MG/24HR TD PT24: 21.0000 mg | MEDICATED_PATCH | Freq: Every day | TRANSDERMAL | Status: DC | PRN

## 2015-03-21 MED ORDER — ALBUTEROL SULFATE HFA 108 (90 BASE) MCG/ACT IN AERS: 2.0000 | INHALATION_SPRAY | RESPIRATORY_TRACT | Status: DC | PRN

## 2015-03-21 MED ORDER — ZOLPIDEM TARTRATE 5 MG PO TABS
5.0000 mg | ORAL_TABLET | Freq: Every evening | ORAL | Status: DC | PRN
  Administered 2015-03-21: 5 mg via ORAL
  Filled 2015-03-21: qty 1

## 2015-03-21 MED ORDER — LINAGLIPTIN 5 MG PO TABS
5.0000 mg | ORAL_TABLET | Freq: Every day | ORAL | Status: DC
  Administered 2015-03-22 – 2015-03-27 (×6): 5 mg via ORAL
  Filled 2015-03-21 (×6): qty 1

## 2015-03-21 MED ORDER — ALUM & MAG HYDROXIDE-SIMETH 200-200-20 MG/5ML PO SUSP: 30.0000 mL | ORAL | Status: DC | PRN

## 2015-03-21 MED ORDER — VERAPAMIL HCL 120 MG PO TABS
120.0000 mg | ORAL_TABLET | Freq: Every day | ORAL | Status: DC
Start: 2015-03-21 — End: 2015-03-27
  Administered 2015-03-21 – 2015-03-26 (×6): 120 mg via ORAL
  Filled 2015-03-21 (×7): qty 1

## 2015-03-21 MED ORDER — DIVALPROEX SODIUM 500 MG PO DR TAB
1000.0000 mg | DELAYED_RELEASE_TABLET | Freq: Every day | ORAL | Status: DC
  Administered 2015-03-21 – 2015-03-26 (×6): 1000 mg via ORAL
  Filled 2015-03-21 (×6): qty 2

## 2015-03-21 MED ORDER — MAGNESIUM OXIDE 400 (241.3 MG) MG PO TABS
400.0000 mg | ORAL_TABLET | Freq: Two times a day (BID) | ORAL | Status: DC
  Administered 2015-03-21 – 2015-03-27 (×12): 400 mg via ORAL
  Filled 2015-03-21 (×13): qty 1

## 2015-03-21 MED ORDER — METFORMIN HCL 500 MG PO TABS
1000.0000 mg | ORAL_TABLET | Freq: Two times a day (BID) | ORAL | Status: DC
  Administered 2015-03-21 – 2015-03-27 (×12): 1000 mg via ORAL
  Filled 2015-03-21 (×14): qty 2

## 2015-03-21 MED ORDER — FAMOTIDINE 20 MG PO TABS
20.0000 mg | ORAL_TABLET | Freq: Two times a day (BID) | ORAL | Status: DC
  Administered 2015-03-21 – 2015-03-27 (×12): 20 mg via ORAL
  Filled 2015-03-21 (×12): qty 1

## 2015-03-21 MED ORDER — TIOTROPIUM BROMIDE MONOHYDRATE 18 MCG IN CAPS
18.0000 ug | ORAL_CAPSULE | Freq: Every day | RESPIRATORY_TRACT | Status: DC
  Administered 2015-03-22 – 2015-03-27 (×6): 18 ug via RESPIRATORY_TRACT
  Filled 2015-03-21 (×2): qty 5

## 2015-03-21 NOTE — ED Notes (Signed)
Patient was A&O x 3. Flat affect. Denied SI/HI, AVH and pain. Patient kept eyes closed when speaking to Clinical research associatewriter. Bed was soiled. Q 15 minute minute checks in progress. Monitored for safety.

## 2015-03-21 NOTE — ED Notes (Signed)
Patient is at the desk asking writer if he can go out and smoke, Clinical research associatewriter explained if he could not go and smoke what happened the last time will happen again. I explained that we are a smoke free facility and we can not let a patient leave form the facility to go and smoke. I ask will he urinate in the  bed again he said it will not be uirne.this time. He is getting mad and is standing at the desk with his arms folded

## 2015-03-21 NOTE — ED Notes (Signed)
MD at bedside. 

## 2015-03-21 NOTE — ED Notes (Signed)
Bed: [facility]PT2 Expected date:  Expected time:  Means of arrival:  Comments: IVC patient

## 2015-03-21 NOTE — ED Notes (Addendum)
[facility]. Per IVC paperwork, [facility]. Caregiver told petitioner that pt had been having bizarre behavior this week including anger control issues, agression, paranoid thinking and poor judgement. Has not taken medication for the past week including insulin injections. Has been wandering away from home for past several days as well. Would not say what he had been doing for the past several days. Denies SI/HI or hallucinations. Pt told RN that he was 54 years old.

## 2015-03-21 NOTE — ED Provider Notes (Signed)
CSN: [id number]     Arrival date & time 03/21/15  1443 History   First MD Initiated Contact with Patient 03/21/15 1456     Chief Complaint  Patient presents with  . IVC      (Consider location/radiation/quality/duration/timing/severity/associated sxs/prior Treatment) HPI   Ronald Roman is a 54 y.o. male who was taken to [facility], by his caseworker for bizarre behavior, not taking medications. Caseworker also felt that he was being more aggressive than usual, and demonstrating poor judgment. Patient reports to me that there are 2 children living in his house, but he cannot see them. He states that he is taking his medicine when I asked him. He denies suicidal or homicidal ideation. There are no other known modifying factors.   Past Medical History  Diagnosis Date  . Diabetes mellitus without complication (HCC)   . Hyperlipidemia   . Hypertension   . GERD (gastroesophageal reflux disease)   . Uric acid nephrolithiasis   . Sleep apnea   . Paranoid schizophrenia (HCC)    History reviewed. No pertinent past surgical history. History reviewed. No pertinent family history. Social History  Substance Use Topics  . Smoking status: Current Every Day Smoker -- 1.00 packs/day    Types: Cigarettes  . Smokeless tobacco: None  . Alcohol Use: No    Review of Systems  All other systems reviewed and are negative.     Allergies  Cogentin; Penicillins; and Prolixin  Home Medications   Prior to Admission medications   Medication Sig Start Date End Date Taking? Authorizing Provider  albuterol (PROVENTIL HFA;VENTOLIN HFA) 108 (90 BASE) MCG/ACT inhaler Inhale 2 puffs into the lungs every 4 (four) hours as needed for shortness of breath.    Historical Provider, MD  atorvastatin (LIPITOR) 40 MG tablet Take 40 mg by mouth daily with breakfast.    Historical Provider, MD  cloZAPine (CLOZARIL) 100 MG tablet Take 1 tablet (100 mg total) by mouth 2 (two) times daily. 02/07/15   Alison Murray, MD   divalproex (DEPAKOTE) 250 MG DR tablet Take 250 mg by mouth at bedtime.    Historical Provider, MD  divalproex (DEPAKOTE) 500 MG DR tablet Take 1,000 mg by mouth at bedtime.    Historical Provider, MD  LORazepam (ATIVAN) 0.5 MG tablet Take 0.5 mg by mouth 2 (two) times daily.    Historical Provider, MD  magnesium oxide (MAG-OX) 400 MG tablet Take 400 mg by mouth 2 (two) times daily.    Historical Provider, MD  metFORMIN (GLUCOPHAGE) 1000 MG tablet Take 1,000 mg by mouth 2 (two) times daily with a meal.    Historical Provider, MD  metoprolol tartrate (LOPRESSOR) 25 MG tablet Take 25 mg by mouth 2 (two) times daily.    Historical Provider, MD  QUEtiapine (SEROQUEL) 50 MG tablet Take 1 tablet (50 mg total) by mouth 2 (two) times daily. 02/07/15   Alison Murray, MD  ranitidine (ZANTAC) 150 MG tablet Take 150 mg by mouth daily with breakfast.    Historical Provider, MD  senna-docusate (SENEXON-S) 8.6-50 MG per tablet Take 1 tablet by mouth 2 (two) times daily.    Historical Provider, MD  sitaGLIPtin (JANUVIA) 100 MG tablet Take 100 mg by mouth daily with breakfast.    Historical Provider, MD  tiotropium (SPIRIVA) 18 MCG inhalation capsule Place 18 mcg into inhaler and inhale daily.    Historical Provider, MD  verapamil (CALAN) 120 MG tablet Take 120 mg by mouth at bedtime.    Historical  Provider, MD   BP 169/88 mmHg  Pulse 101  Temp(Src) 98.6 F (37 C) (Oral)  SpO2 100% Physical Exam  Constitutional: He appears well-developed.  Overweight, appears older than stated age.  HENT:  Head: Normocephalic and atraumatic.  Right Ear: External ear normal.  Left Ear: External ear normal.  Continually grinding teeth. No visible dental injury, or oral lesion.  Eyes: Conjunctivae and EOM are normal. Pupils are equal, round, and reactive to light.  Neck: Normal range of motion and phonation normal. Neck supple.  Cardiovascular: Normal rate, regular rhythm and normal heart sounds.   Pulmonary/Chest: Effort  normal and breath sounds normal. He exhibits no bony tenderness.  Abdominal: Soft. There is no tenderness.  Musculoskeletal: Normal range of motion.  Neurological: He is alert. No cranial nerve deficit or sensory deficit. He exhibits normal muscle tone. Coordination normal.  Skin: Skin is warm, dry and intact.  Psychiatric:  Anxious, defensive, paranoid.  Nursing note and vitals reviewed.   ED Course  Procedures (including critical care time)  Medications  acetaminophen (TYLENOL) tablet 650 mg (not administered)  ibuprofen (ADVIL,MOTRIN) tablet 600 mg (not administered)  zolpidem (AMBIEN) tablet 5 mg (not administered)  nicotine (NICODERM CQ - dosed in mg/24 hours) patch 21 mg (not administered)  ondansetron (ZOFRAN) tablet 4 mg (not administered)  alum & mag hydroxide-simeth (MAALOX/MYLANTA) 200-200-20 MG/5ML suspension 30 mL (not administered)    Patient Vitals for the past 24 hrs:  BP Temp Temp src Pulse SpO2  03/21/15 1457 169/88 mmHg 98.6 F (37 C) Oral 101 100 %   IVC paperwork was supported with first opinion, by me.  TTS consultation for placement.   4:42 PM Reevaluation with update and discussion. After initial assessment and treatment, an updated evaluation reveals no change in clinical status. Patient is medically cleared at this time.[name]. [name]    Labs Review Labs Reviewed  COMPREHENSIVE METABOLIC PANEL - Abnormal; Notable for the following:    Glucose, Bld 190 (*)    BUN 23 (*)    AST 52 (*)    All other components within normal limits  ACETAMINOPHEN LEVEL - Abnormal; Notable for the following:    Acetaminophen (Tylenol), Serum <10 (*)    All other components within normal limits  CBC - Abnormal; Notable for the following:    WBC 15.5 (*)    Hemoglobin 12.5 (*)    HCT 38.1 (*)    Platelets 403 (*)    All other components within normal limits  CBG MONITORING, ED - Abnormal; Notable for the following:    Glucose-Capillary 170 (*)    All other  components within normal limits  ETHANOL  SALICYLATE LEVEL  URINE RAPID DRUG SCREEN, HOSP PERFORMED    Imaging Review No results found. I have personally reviewed and evaluated these images and lab results as part of my medical decision-making.   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 He will require admission for monitoring and evaluation, and observed psychiatric setting. Doubt serous bacterial infection. Metabolic instability or impending vascular collapse.   Nursing Notes Reviewed/ Care Coordinated, and agree without changes. Applicable Imaging Reviewed.  Interpretation of Laboratory Data incorporated into ED treatment  Plan: Admit to psychiatry    Mancel BaleElliott Loys Hoselton, MD 03/21/15 2320

## 2015-03-21 NOTE — BH Assessment (Addendum)
Tele Assessment Note   Ronald Roman is an 54 y.o. male  who presents accompanied by police reporting symptoms of depression and suicidal ideation. Pt has a history of paranoid schizophrenia and was IVC'd by [employer] IVC states, "respondant is a 54 yo male who was brought to crisis center by a caretaker form [place]. She reports increased bizarre behavior this week including anger control issues, agression, paranoid thinking, and poor judgement. He has not been taking his medications for several days. He is disheveled. He requires insulin and may not be getting his injections. He is a poor historian and cannot account for his activities for the last few days. He is considered a threat to himself.  Pt denies suicidal ideation or past attempts.  Pt acknowledges symptoms including decreased sleep, "I haven't been able to sleep at all last night and before the past few days I slept non-stop" Pt dies homicidal ideation or history of violence. Pt denies auditory or visual hallucinations or other psychotic symptoms. Pt denies alcohol or substance abuse.  Pt is unable to state current stressors. Pt states that he lives alone, and supports include his mom and his daughter. Pt denies history of abuse and trauma. When asked about his work history, pt states, "I am going to work when I get out of here. They are going to put a huge fan over me and it is going to blow on me...." . Pt has limited insight and poor judgement.  Pt's OP history includes involvement at [facility] and [name]. Pt is unable to state IP history.  Pt is disheveled, alert, disoriented (stating it is Nov 21st) with slurred speech and slowed motor behavior. Eye contact is poor, as pt is lying on stretcher and drowsy.  Pt's mood is irritable and affect is paranoid and blunted. Affect is congruent with mood. Thought process is irratic. There is no indication Pt is currently responding to internal stimuli but it appears he is experiencing  delusional thought content. Pt was cooperative throughout assessment. Pt is currently unable to contract for safety outside the hospital and wants inpatient psychiatric treatment.  TTS to seek placement. Barrington Ellison, Triage Specialist, MS, LPC  Diagnosis: Paranoid Schizophrenia  Past Medical History:  Past Medical History  Diagnosis Date  . Diabetes mellitus without complication (HCC)   . Hyperlipidemia   . Hypertension   . GERD (gastroesophageal reflux disease)   . Uric acid nephrolithiasis   . Sleep apnea   . Paranoid schizophrenia (HCC)     History reviewed. No pertinent past surgical history.  Family History: History reviewed. No pertinent family history.  Social History:  reports that he has been smoking Cigarettes.  He has been smoking about 1.00 pack per day. He does not have any smokeless tobacco history on file. He reports that he does not drink alcohol or use illicit drugs.  Additional Social History:  Alcohol / Drug Use Pain Medications: deferred Prescriptions: deferred Over the Counter: deferred History of alcohol / drug use?:  (pt denies) Negative Consequences of Use:  (denies) Withdrawal Symptoms:  (denies)  CIWA: CIWA-Ar BP: 169/88 mmHg Pulse Rate: 101 COWS:    PATIENT STRENGTHS: (choose at least two) Ability for insight Supportive family/friends  Allergies:  Allergies  Allergen Reactions  . Cogentin [Benztropine]     Per MAR   . Penicillins     Per MAR   . Prolixin [Fluphenazine]     Per MAR     Home Medications:  (Not in a  hospital admission)  OB/GYN Status:  No LMP for male patient.  General Assessment Data [facility] of Assessment: [facility] TTS Assessment: In system Is this a Tele or Face-to-Face Assessment?: Face-to-Face Is this an Initial Assessment or a Re-assessment for this encounter?: Initial Assessment Marital status:  (UTA) Living Arrangements: Alone Admission Status: Involuntary Is patient capable of signing voluntary admission?:  Yes [facility] Insurance type: MCD     Crisis Care Plan Living Arrangements: Alone [facility]  Education Status Is patient currently in school?: No  Risk to self with the past 6 months Suicidal Ideation: No Has patient been a risk to self within the past 6 months prior to admission? : Yes (through neglecting himself) Suicidal Intent: No Has patient had any suicidal intent within the past 6 months prior to admission? : No Is patient at risk for suicide?: No Suicidal Plan?: No Has patient had any suicidal plan within the past 6 months prior to admission? : No Access to Means: No What has been your use of drugs/alcohol within the last 12 months?: pt denies Previous Attempts/Gestures: No (denies) Intentional Self Injurious Behavior: None Family Suicide History: Unknown Recent stressful life event(s):  (UTA) Persecutory voices/beliefs?: Yes Depression: No Depression Symptoms: Insomnia, Isolating Substance abuse history and/or treatment for substance abuse?:  (denies) Suicide prevention information given to non-admitted patients: Not applicable  Risk to Others within the past 6 months Homicidal Ideation: No Does patient have any lifetime risk of violence toward others beyond the six months prior to admission? : No Thoughts of Harm to Others: No Current Homicidal Intent: No Current Homicidal Plan: No Access to Homicidal Means: No History of harm to others?:  (UTA) Assessment of Violence: None Noted Does patient have access to weapons?: No Criminal Charges Pending?: No Does patient have a court date: No Is patient on probation?: Unknown  Psychosis Hallucinations:  (denies) Delusions: Persecutory  Mental Status Report Appearance/Hygiene: Disheveled, Poor hygiene, In scrubs Eye Contact: Poor Motor Activity: Psychomotor retardation Speech: Pressured, Tangential Level of Consciousness: Drowsy Mood:  Irritable Affect: Apprehensive, Irritable Anxiety Level: Severe Thought Processes: Flight of Ideas, Tangential Judgement: Impaired Orientation: Not oriented Obsessive Compulsive Thoughts/Behaviors: Moderate  Cognitive Functioning Concentration: Poor Memory: Unable to Assess IQ: Average Insight: Poor Impulse Control: Poor Appetite: Fair Weight Loss: 0 Weight Gain: 0 Sleep: Decreased Total Hours of Sleep:  ("I didn't sleep all night") Vegetative Symptoms: Decreased grooming  ADLScreening [facility]) Patient's cognitive ability adequate to safely complete daily activities?: Yes Patient able to express need for assistance with ADLs?: Yes Independently performs ADLs?: Yes (appropriate for developmental age)  Prior Inpatient Therapy Prior Inpatient Therapy:  (UTA)  Prior Outpatient Therapy Prior Outpatient Therapy: Yes Prior Therapy Dates: unk Prior Therapy Facilty/Provider(s): [facility] Does patient have an ACCT team?: Unknown Does patient have Intensive In-House Services?  : Unknown Does patient have [facility] services? : Yes Does patient have P4CC services?: No  ADL Screening (condition at time of admission) Patient's cognitive ability adequate to safely complete daily activities?: Yes Is the patient deaf or have difficulty hearing?: No Does the patient have difficulty seeing, even when wearing glasses/contacts?: No Does the patient have difficulty concentrating, remembering, or making decisions?: Yes Patient able to express need for assistance with ADLs?: Yes Does the patient have difficulty dressing or bathing?: No Independently performs ADLs?: Yes (appropriate for developmental age) Does the patient have difficulty walking or climbing stairs?: No  Home Assistive Devices/Equipment Home Assistive Devices/Equipment:  None    Abuse/Neglect Assessment (Assessment to be complete while patient is alone) Physical Abuse: Denies Verbal Abuse: Denies Sexual Abuse:  Denies Exploitation of patient/patient's resources: Denies Self-Neglect: Denies Values / Beliefs Cultural Requests During Hospitalization: None Spiritual Requests During Hospitalization: None   Advance Directives (For Healthcare) Does patient have an advance directive?: No Would patient like information on creating an advanced directive?: No - patient declined information    Additional Information 1:1 In Past 12 Months?: No CIRT Risk: Yes Elopement Risk: Yes Does patient have medical clearance?: Yes     Disposition:  Disposition Initial Assessment Completed for this Encounter: Yes Disposition of Patient: Inpatient treatment program  [facility] 03/21/2015 4:53 PM

## 2015-03-22 DIAGNOSIS — F200 Paranoid schizophrenia: Secondary | ICD-10-CM | POA: Diagnosis not present

## 2015-03-22 LAB — RAPID URINE DRUG SCREEN, HOSP PERFORMED
AMPHETAMINES: NOT DETECTED
BARBITURATES: NOT DETECTED
Benzodiazepines: NOT DETECTED
Cocaine: NOT DETECTED
Opiates: NOT DETECTED
TETRAHYDROCANNABINOL: NOT DETECTED

## 2015-03-22 LAB — CBG MONITORING, ED: GLUCOSE-CAPILLARY: 115 mg/dL — AB (ref 65–99)

## 2015-03-22 NOTE — ED Notes (Signed)
Pt awake, alert & responsive, no distress noted. Sleeping at present.  Monitoring for safety, Q 15 min checks in effect.

## 2015-03-22 NOTE — Progress Notes (Signed)
12:55pm. Psych team requested CSW speak with pt's case manager for collateral. CSW [facility] [phone]), peer support specialist and case worker. Springs states that at baseline pt is engaged with others, pleasant and smiles a lot. In the week, however, pt's behavior has changed--he is hardly engaged with those around him, aggressive and agitated when he does engage, and is grinding his teeth [facility](Springs has never seen him grind his teeth before). Springs reports that pt moved into his own apartment six weeks ago, and everything was going well until a week or so ago. Springs believes pt is not managing his medicine consistently. Right now, he does not have an ACT team or anyone who administers his medicine. In addition, his medicines have been changed multiple times over the past several months. Springs recommends treatment, and states that his treatment team is also seeking for a higher level of outpatient care for patient. CSW shared this information with psych team.  York SpanielAlexandra Mykale Gandolfo [facility]CSWA Clinical Social Worker [facility] phone: [phone]

## 2015-03-22 NOTE — Consult Note (Signed)
[place] Psychiatry Consult   Reason for Consult:  Psychiatric Evaluation  Referring Physician: EDP Patient Identification: DERWIN REDDY MRN:  [id number] Principal Diagnosis: Paranoid Schizophrenia Diagnosis:   Patient Active Problem List   Diagnosis Date Noted  . Paranoid schizophrenia ([place]) [F20.0]     Priority: Medium  . Leukocytosis [D72.829] 02/06/2015  . Sleep apnea [G47.30] 02/05/2015  . AKI (acute kidney injury) ([place]) [N17.9] 02/04/2015  . Seizure disorder ([place]) [W23.762] 02/04/2015  . Acute encephalopathy [G93.40] 02/04/2015  . Diabetes mellitus without complication ([place]) [G31.5]   . Hyperlipidemia [E78.5]   . Hypertension [I10]     Total Time spent with patient: 30 minutes  Subjective:   DARRIUS MONTANO is a 54 y.o. male patient who states "I can't rest."  HPI:  Kamdon Reisig is 54 yo African American who was brought to the [facility] after being IVC'd by [facility] for "exhibiting bizarre behavior, anger control issues, aggression, and paranoid thinking."  The patient is a poor historian and reports he has a history of "manic depression" and that he was sent here "to get some sleep." He reports his meds include "Pro Air and Insulin" and that he last had his medications 2 days ago. He reports he lives alone. He is oriented to person. He is disoriented to time (thinks it is April 13, 2016). He knows Mady Gemma is Software engineer. He is calm and cooperative. He reports having difficulty sleeping. He states he has a case Freight forwarder, Nannette.  He denies suicidal or homicidal ideation, intent or plan. He denies previous suicidal attempts. He denies alcohol use or substance abuse.   Past Psychiatric History: Paranoid schizophrenia  Risk to Self: Suicidal Ideation: No Suicidal Intent: No Is patient at risk for suicide?: No Suicidal Plan?: No Access to Means: No What has been your use of drugs/alcohol within the last 12 months?: pt denies Intentional Self Injurious Behavior:  None Risk to Others: Homicidal Ideation: No Thoughts of Harm to Others: No Current Homicidal Intent: No Current Homicidal Plan: No Access to Homicidal Means: No History of harm to others?:  (UTA) Assessment of Violence: None Noted Does patient have access to weapons?: No Criminal Charges Pending?: No Does patient have a court date: No Prior Inpatient Therapy: Prior Inpatient Therapy:  (UTA) Prior Outpatient Therapy: Prior Outpatient Therapy: Yes Prior Therapy Dates: unk Prior Therapy Facilty/Provider(s): [facility] Does patient have an ACCT team?: Unknown Does patient have Intensive In-House Services?  : Unknown Does patient have [facility] services? : Yes Does patient have P4CC services?: No  Past Medical History:  Past Medical History  Diagnosis Date  . Diabetes mellitus without complication ([place])   . Hyperlipidemia   . Hypertension   . GERD (gastroesophageal reflux disease)   . Uric acid nephrolithiasis   . Sleep apnea   . Paranoid schizophrenia ([place])    History reviewed. No pertinent past surgical history. Family History: History reviewed. No pertinent family history. Family Psychiatric  History: None reported Social History:  History  Alcohol Use No     History  Drug Use No    Social History   Social History  . Marital Status: Single    Spouse Name: N/A  . Number of Children: N/A  . Years of Education: N/A   Social History Main Topics  . Smoking status: Current Every Day Smoker -- 1.00 packs/day    Types: Cigarettes  . Smokeless tobacco: None  . Alcohol Use: No  . Drug Use: No  . Sexual Activity: Not Asked  Other Topics Concern  . None   Social History Narrative   Additional Social History:    Pain Medications: deferred Prescriptions: deferred Over the Counter: deferred History of alcohol / drug use?:  (pt denies) Negative Consequences of Use:  (denies) Withdrawal Symptoms:  (denies)   Allergies:   Allergies  Allergen Reactions  . Cogentin  [Benztropine]     Per MAR   . Penicillins     Per MAR   . Prolixin [Fluphenazine]     Per MAR     Labs:  Results for orders placed or performed during the hospital encounter of 03/21/15 (from the past 48 hour(s))  CBG monitoring, ED     Status: Abnormal   Collection Time: 03/21/15  3:26 PM  Result Value Ref Range   Glucose-Capillary 170 (H) 65 - 99 mg/dL  Comprehensive metabolic panel     Status: Abnormal   Collection Time: 03/21/15  3:30 PM  Result Value Ref Range   Sodium 135 135 - 145 mmol/L   Potassium 3.9 3.5 - 5.1 mmol/L   Chloride 103 101 - 111 mmol/L   CO2 23 22 - 32 mmol/L   Glucose, Bld 190 (H) 65 - 99 mg/dL   BUN 23 (H) 6 - 20 mg/dL   Creatinine, Ser 1.11 0.61 - 1.24 mg/dL   Calcium 9.0 8.9 - 10.3 mg/dL   Total Protein 7.6 6.5 - 8.1 g/dL   Albumin 4.0 3.5 - 5.0 g/dL   AST 52 (H) 15 - 41 U/L   ALT 40 17 - 63 U/L   Alkaline Phosphatase 98 38 - 126 U/L   Total Bilirubin 0.7 0.3 - 1.2 mg/dL   GFR calc non Af Amer >60 >60 mL/min   GFR calc Af Amer >60 >60 mL/min    Comment: (NOTE) The eGFR has been calculated using the CKD EPI equation. This calculation has not been validated in all clinical situations. eGFR's persistently <60 mL/min signify possible Chronic Kidney Disease.    Anion gap 9 5 - 15  Ethanol (ETOH)     Status: None   Collection Time: 03/21/15  3:30 PM  Result Value Ref Range   Alcohol, Ethyl (B) <5 <5 mg/dL    Comment:        LOWEST DETECTABLE LIMIT FOR SERUM ALCOHOL IS 5 mg/dL FOR MEDICAL PURPOSES ONLY   Salicylate level     Status: None   Collection Time: 03/21/15  3:30 PM  Result Value Ref Range   Salicylate Lvl <5.0 2.8 - 30.0 mg/dL  Acetaminophen level     Status: Abnormal   Collection Time: 03/21/15  3:30 PM  Result Value Ref Range   Acetaminophen (Tylenol), Serum <10 (L) 10 - 30 ug/mL    Comment:        THERAPEUTIC CONCENTRATIONS VARY SIGNIFICANTLY. A RANGE OF 10-30 ug/mL MAY BE AN EFFECTIVE CONCENTRATION FOR MANY  PATIENTS. HOWEVER, SOME ARE BEST TREATED AT CONCENTRATIONS OUTSIDE THIS RANGE. ACETAMINOPHEN CONCENTRATIONS >150 ug/mL AT 4 HOURS AFTER INGESTION AND >50 ug/mL AT 12 HOURS AFTER INGESTION ARE OFTEN ASSOCIATED WITH TOXIC REACTIONS.   CBC     Status: Abnormal   Collection Time: 03/21/15  3:30 PM  Result Value Ref Range   WBC 15.5 (H) 4.0 - 10.5 K/uL   RBC 4.73 4.22 - 5.81 MIL/uL   Hemoglobin 12.5 (L) 13.0 - 17.0 g/dL   HCT 38.1 (L) 39.0 - 52.0 %   MCV 80.5 78.0 - 100.0 fL   MCH 26.4 26.0 -  34.0 pg   MCHC 32.8 30.0 - 36.0 g/dL   RDW 14.9 11.5 - 15.5 %   Platelets 403 (H) 150 - 400 K/uL  Differential     Status: Abnormal   Collection Time: 03/21/15  3:30 PM  Result Value Ref Range   Neutrophils Relative % 73 %   Neutro Abs 11.0 (H) 1.7 - 7.7 K/uL   Lymphocytes Relative 18 %   Lymphs Abs 2.7 0.7 - 4.0 K/uL   Monocytes Relative 8 %   Monocytes Absolute 1.2 (H) 0.1 - 1.0 K/uL   Eosinophils Relative 1 %   Eosinophils Absolute 0.1 0.0 - 0.7 K/uL   Basophils Relative 0 %   Basophils Absolute 0.0 0.0 - 0.1 K/uL  CBG monitoring, ED     Status: Abnormal   Collection Time: 03/21/15  5:38 PM  Result Value Ref Range   Glucose-Capillary 175 (H) 65 - 99 mg/dL  CBG monitoring, ED     Status: Abnormal   Collection Time: 03/21/15  9:43 PM  Result Value Ref Range   Glucose-Capillary 180 (H) 65 - 99 mg/dL  Urine rapid drug screen (hosp performed) (Not at [facility])     Status: None   Collection Time: 03/22/15  8:30 AM  Result Value Ref Range   Opiates NONE DETECTED NONE DETECTED   Cocaine NONE DETECTED NONE DETECTED   Benzodiazepines NONE DETECTED NONE DETECTED   Amphetamines NONE DETECTED NONE DETECTED   Tetrahydrocannabinol NONE DETECTED NONE DETECTED   Barbiturates NONE DETECTED NONE DETECTED    Comment:        DRUG SCREEN FOR MEDICAL PURPOSES ONLY.  IF CONFIRMATION IS NEEDED FOR ANY PURPOSE, NOTIFY LAB WITHIN 5 DAYS.        LOWEST DETECTABLE LIMITS FOR URINE DRUG SCREEN Drug  Class       Cutoff (ng/mL) Amphetamine      1000 Barbiturate      200 Benzodiazepine   456 Tricyclics       256 Opiates          300 Cocaine          300 THC              50     Current Facility-Administered Medications  Medication Dose Route Frequency Provider Last Rate Last Dose  . acetaminophen (TYLENOL) tablet 650 mg  650 mg Oral Q4H PRN Daleen Bo, MD      . albuterol (PROVENTIL HFA;VENTOLIN HFA) 108 (90 BASE) MCG/ACT inhaler 2 puff  2 puff Inhalation Q4H PRN Daleen Bo, MD      . alum & mag hydroxide-simeth (MAALOX/MYLANTA) 200-200-20 MG/5ML suspension 30 mL  30 mL Oral PRN Daleen Bo, MD      . atorvastatin (LIPITOR) tablet 40 mg  40 mg Oral q1800 Daleen Bo, MD      . cloZAPine (CLOZARIL) tablet 100 mg  100 mg Oral BID Daleen Bo, MD   100 mg at 03/22/15 1055  . divalproex (DEPAKOTE) DR tablet 1,000 mg  1,000 mg Oral QHS Daleen Bo, MD   1,000 mg at 03/21/15 2142  . divalproex (DEPAKOTE) DR tablet 250 mg  250 mg Oral QHS Daleen Bo, MD   250 mg at 03/21/15 2145  . famotidine (PEPCID) tablet 20 mg  20 mg Oral BID Daleen Bo, MD   20 mg at 03/22/15 1015  . ibuprofen (ADVIL,MOTRIN) tablet 600 mg  600 mg Oral Q8H PRN Daleen Bo, MD      . linagliptin (TRADJENTA) tablet  5 mg  5 mg Oral Daily Daleen Bo, MD   5 mg at 03/22/15 1015  . LORazepam (ATIVAN) tablet 0.5 mg  0.5 mg Oral BID Daleen Bo, MD   0.5 mg at 03/22/15 1015  . magnesium oxide (MAG-OX) tablet 400 mg  400 mg Oral BID Daleen Bo, MD   400 mg at 03/22/15 1015  . metFORMIN (GLUCOPHAGE) tablet 1,000 mg  1,000 mg Oral BID WC Daleen Bo, MD   1,000 mg at 03/22/15 1737  . metoprolol tartrate (LOPRESSOR) tablet 25 mg  25 mg Oral BID Daleen Bo, MD   25 mg at 03/22/15 1015  . nicotine (NICODERM CQ - dosed in mg/24 hours) patch 21 mg  21 mg Transdermal Daily PRN Daleen Bo, MD      . ondansetron [facility]) tablet 4 mg  4 mg Oral Q8H PRN Daleen Bo, MD      . QUEtiapine (SEROQUEL) tablet  50 mg  50 mg Oral BID Daleen Bo, MD   50 mg at 03/22/15 1015  . tiotropium (SPIRIVA) inhalation capsule 18 mcg  18 mcg Inhalation Daily Daleen Bo, MD   18 mcg at 03/22/15 [phone]  . verapamil (CALAN) tablet 120 mg  120 mg Oral QHS Daleen Bo, MD   120 mg at 03/21/15 2145  . zolpidem (AMBIEN) tablet 5 mg  5 mg Oral QHS PRN Daleen Bo, MD   5 mg at 03/21/15 2145   Current Outpatient Prescriptions  Medication Sig Dispense Refill  . cloZAPine (CLOZARIL) 100 MG tablet Take 1 tablet (100 mg total) by mouth 2 (two) times daily. (Patient taking differently: Take 200 mg by mouth 2 (two) times daily. ) 60 tablet 0  . divalproex (DEPAKOTE) 500 MG DR tablet Take 1,000 mg by mouth at bedtime.    Marland Kitchen LORazepam (ATIVAN) 0.5 MG tablet Take 0.5 mg by mouth 2 (two) times daily.    . QUEtiapine (SEROQUEL) 100 MG tablet Take 100 mg by mouth 2 (two) times daily.    . QUEtiapine (SEROQUEL) 50 MG tablet Take 1 tablet (50 mg total) by mouth 2 (two) times daily. (Patient taking differently: Take 25 mg by mouth 2 (two) times daily. ) 60 tablet 0  . albuterol (PROVENTIL HFA;VENTOLIN HFA) 108 (90 BASE) MCG/ACT inhaler Inhale 2 puffs into the lungs every 4 (four) hours as needed for shortness of breath.    Marland Kitchen atorvastatin (LIPITOR) 40 MG tablet Take 40 mg by mouth daily with breakfast.    . divalproex (DEPAKOTE) 250 MG DR tablet Take 250 mg by mouth at bedtime.    . magnesium oxide (MAG-OX) 400 MG tablet Take 400 mg by mouth 2 (two) times daily.    . metFORMIN (GLUCOPHAGE) 1000 MG tablet Take 1,000 mg by mouth 2 (two) times daily with a meal.    . metoprolol tartrate (LOPRESSOR) 25 MG tablet Take 25 mg by mouth 2 (two) times daily.    . ranitidine (ZANTAC) 150 MG tablet Take 150 mg by mouth daily with breakfast.    . senna-docusate (SENEXON-S) 8.6-50 MG per tablet Take 1 tablet by mouth 2 (two) times daily.    . sitaGLIPtin (JANUVIA) 100 MG tablet Take 100 mg by mouth daily with breakfast.    . tiotropium (SPIRIVA)  18 MCG inhalation capsule Place 18 mcg into inhaler and inhale daily.    . verapamil (CALAN) 120 MG tablet Take 120 mg by mouth at bedtime.      Musculoskeletal: Strength & Muscle Tone: not assessed; patient laying  in bed Gait & Station: not assessed; patient laying in bed Patient leans: not assessed; patient laying in bed  Psychiatric Specialty Exam: Review of Systems  Constitutional: Negative.   HENT: Negative.   Eyes: Negative.   Respiratory: Negative.   Cardiovascular: Negative.   Gastrointestinal: Negative.   Genitourinary: Negative.   Musculoskeletal: Negative.   Skin: Negative.   Neurological: Negative.   Endo/Heme/Allergies: Negative.   Psychiatric/Behavioral: The patient has insomnia.     Blood pressure 115/64, pulse 100, temperature 98.2 F (36.8 C), temperature source Oral, resp. rate 16, SpO2 99 %.There is no weight on file to calculate BMI.  General Appearance: Disheveled  Eye Sport and exercise psychologist::  None  Speech:  Clear and Coherent and Slow  Volume:  Decreased  Mood:  Depressed  Affect:  Congruent and Flat  Thought Process:  Disorganized  Orientation:  Other:  Oriented to person only  Thought Content:  No psychosis  Suicidal Thoughts:  No  Homicidal Thoughts:  No  Memory:  Immediate;   Poor Recent;   Poor Remote;   Poor  Judgement:  Poor  Insight:  Lacking  Psychomotor Activity:  Decreased  Concentration:  Poor  Recall:  Poor  Fund of Knowledge:Poor  Language: Poor  Akathisia:  No  Handed:  Right  AIMS (if indicated):     Assets:  Housing Physical Health  ADL's:  Intact  Cognition: Impaired,  Mild  Sleep:      Treatment Plan Summary: Daily contact with patient to assess and evaluate symptoms and progress in treatment and Medication management  -Crisis stabilization -Continue home medications -Social work to Armed forces training and education officer, Nannette, to determine patient's baseline functioning and medication compliance.    Disposition: Patient will remain in [facility]  overnight and will have re-evaluation by psychiatry in the morning.   Serena Colonel, FNP-BC [place] 03/22/2015 6:27 PM Patient seen and discussed with me , as above  Neita Garnet, MD

## 2015-03-22 NOTE — ED Notes (Signed)
Pt incontinent of urine a 2nd episode noted, comfort measures given.

## 2015-03-22 NOTE — Progress Notes (Signed)
Pt slept majority of this shift. Visible in milieu at intervals during trips to the bathroom when awake. Pt has been guarded and  With drawned to his room. Pt oriented to self only when assessed. Denied SI, HI, AVH and pain. Gait steady this shift. Pt has been continent of urine thus far compared to previous shift. Refused to shower when approached despite multiple prompts and encouragement. Urine sample obtained and sent to lab earlier this shift without issues. Pt tolerated meals and medications well when offered. Vitals done and WNL. Continued support, availability and encouragement offered throughout this shift. Safety maintained on Q 15 minutes checks as ordered without outburst or self injurious behavior to note at this time.

## 2015-03-22 NOTE — Progress Notes (Signed)
Disposition CSW completed patient referrals to the following [facility]  CSW will continue to follow patient for placement needs.  Seward SpeckLeo Aviella Disbrow [facility],LCAS Behavioral Health Disposition CSW [phone]

## 2015-03-23 LAB — CBG MONITORING, ED
GLUCOSE-CAPILLARY: 119 mg/dL — AB (ref 65–99)
Glucose-Capillary: 129 mg/dL — ABNORMAL HIGH (ref 65–99)
Glucose-Capillary: 103 mg/dL — ABNORMAL HIGH (ref 65–99)

## 2015-03-23 NOTE — ED Notes (Signed)
Pt is alert and oriented.  He denies SI, HI, and AVH but he is acting bizarre.  He keeps walking into bathroom and getting dirty scrubs out of the garbage and folding them.  He keeps asking where the smoking area is.  15 minute checks and video monitoring continue.

## 2015-03-23 NOTE — Progress Notes (Signed)
[facility]  03/23/2015 8:28 PM Ronald AngstBarry D Roman  MRN:  [id number] Subjective:  "I am hearing music" Principal Problem: Paranoid schizophrenia (HCC) Diagnosis:   Patient Active Problem List   Diagnosis Date Noted  . Paranoid schizophrenia (HCC) [F20.0]     Priority: Medium  . Leukocytosis [D72.829] 02/06/2015  . Sleep apnea [G47.30] 02/05/2015  . AKI (acute kidney injury) (HCC) [N17.9] 02/04/2015  . Seizure disorder (HCC) [G40.909] 02/04/2015  . Acute encephalopathy [G93.40] 02/04/2015  . Diabetes mellitus without complication (HCC) [E11.9]   . Hyperlipidemia [E78.5]   . Hypertension [I10]    Total Time spent with patient: 20 minutes  Past Psychiatric History: Paranoid Schizophrenia  Past Medical History:  Past Medical History  Diagnosis Date  . Diabetes mellitus without complication (HCC)   . Hyperlipidemia   . Hypertension   . GERD (gastroesophageal reflux disease)   . Uric acid nephrolithiasis   . Sleep apnea   . Paranoid schizophrenia (HCC)    History reviewed. No pertinent past surgical history. Family History: History reviewed. No pertinent family history. Family Psychiatric  History: denies Social History:  History  Alcohol Use No     History  Drug Use No    Social History   Social History  . Marital Status: Single    Spouse Name: N/A  . Number of Children: N/A  . Years of Education: N/A   Social History Main Topics  . Smoking status: Current Every Day Smoker -- 1.00 packs/day    Types: Cigarettes  . Smokeless tobacco: None  . Alcohol Use: No  . Drug Use: No  . Sexual Activity: Not Asked   Other Topics Concern  . None   Social History Narrative   Additional Social History:    Pain Medications: deferred Prescriptions: deferred Over the Counter: deferred History of alcohol / drug use?:  (pt denies) Negative Consequences of Use:  (denies) Withdrawal Symptoms:  (denies)    Sleep: Fair  Appetite:  Good  Current  Medications: Current Facility-Administered Medications  Medication Dose Route Frequency Provider Last Rate Last Dose  . acetaminophen (TYLENOL) tablet 650 mg  650 mg Oral Q4H PRN Mancel BaleElliott Wentz, MD      . albuterol (PROVENTIL HFA;VENTOLIN HFA) 108 (90 BASE) MCG/ACT inhaler 2 puff  2 puff Inhalation Q4H PRN Mancel BaleElliott Wentz, MD      . alum & mag hydroxide-simeth (MAALOX/MYLANTA) 200-200-20 MG/5ML suspension 30 mL  30 mL Oral PRN Mancel BaleElliott Wentz, MD      . atorvastatin (LIPITOR) tablet 40 mg  40 mg Oral q1800 Mancel BaleElliott Wentz, MD   40 mg at 03/23/15 1911  . cloZAPine (CLOZARIL) tablet 100 mg  100 mg Oral BID Mancel BaleElliott Wentz, MD   100 mg at 03/23/15 1131  . divalproex (DEPAKOTE) DR tablet 1,000 mg  1,000 mg Oral QHS Mancel BaleElliott Wentz, MD   1,000 mg at 03/22/15 2153  . divalproex (DEPAKOTE) DR tablet 250 mg  250 mg Oral QHS Mancel BaleElliott Wentz, MD   250 mg at 03/22/15 2154  . famotidine (PEPCID) tablet 20 mg  20 mg Oral BID Mancel BaleElliott Wentz, MD   20 mg at 03/23/15 1132  . ibuprofen (ADVIL,MOTRIN) tablet 600 mg  600 mg Oral Q8H PRN Mancel BaleElliott Wentz, MD      . linagliptin (TRADJENTA) tablet 5 mg  5 mg Oral Daily Mancel BaleElliott Wentz, MD   5 mg at 03/23/15 1132  . LORazepam (ATIVAN) tablet 0.5 mg  0.5 mg Oral BID Mancel BaleElliott Wentz, MD   0.5 mg  at 03/23/15 1133  . magnesium oxide (MAG-OX) tablet 400 mg  400 mg Oral BID [name], MD   400 mg at 03/23/15 1132  . metFORMIN (GLUCOPHAGE) tablet 1,000 mg  1,000 mg Oral BID WC [name], MD   1,000 mg at 03/23/15 1912  . metoprolol tartrate (LOPRESSOR) tablet 25 mg  25 mg Oral BID [name], MD   25 mg at 03/23/15 1132  . nicotine (NICODERM CQ - dosed in mg/24 hours) patch 21 mg  21 mg Transdermal Daily PRN [name], MD      . ondansetron [facility]) tablet 4 mg  4 mg Oral Q8H PRN [name], MD      . QUEtiapine (SEROQUEL) tablet 50 mg  50 mg Oral BID [name], MD   50 mg at 03/23/15 1131  . tiotropium (SPIRIVA) inhalation capsule 18 mcg  18 mcg Inhalation Daily [name], MD   18 mcg at 03/23/15 0749  . verapamil (CALAN) tablet 120 mg  120 mg Oral QHS [name], MD   120 mg at 03/22/15 2153  . zolpidem (AMBIEN) tablet 5 mg  5 mg Oral QHS PRN [name], MD   5 mg at 03/21/15 2145   Current Outpatient Prescriptions  Medication Sig Dispense Refill  . cloZAPine (CLOZARIL) 100 MG tablet Take 1 tablet (100 mg total) by mouth 2 (two) times daily. (Patient taking differently: Take 200 mg by mouth 2 (two) times daily. ) 60 tablet 0  . divalproex (DEPAKOTE) 500 MG DR tablet Take 1,000 mg by mouth at bedtime.    Marland Kitchen LORazepam (ATIVAN) 0.5 MG tablet Take 0.5 mg by mouth 2 (two) times daily.    . QUEtiapine (SEROQUEL) 100 MG tablet Take 100 mg by mouth 2 (two) times daily.    . QUEtiapine (SEROQUEL) 50 MG tablet Take 1 tablet (50 mg total) by mouth 2 (two) times daily. (Patient taking differently: Take 25 mg by mouth 2 (two) times daily. ) 60 tablet 0  . albuterol (PROVENTIL HFA;VENTOLIN HFA) 108 (90 BASE) MCG/ACT inhaler Inhale 2 puffs into the lungs every 4 (four) hours as needed for shortness of breath.    Marland Kitchen atorvastatin (LIPITOR) 40 MG tablet Take 40 mg by mouth daily with breakfast.    . divalproex (DEPAKOTE) 250 MG DR tablet Take 250 mg by mouth at bedtime.    . magnesium oxide (MAG-OX) 400 MG tablet Take 400 mg by mouth 2 (two) times daily.    . metFORMIN (GLUCOPHAGE) 1000 MG tablet Take 1,000 mg by mouth 2 (two) times daily with a meal.    . metoprolol tartrate (LOPRESSOR) 25 MG tablet Take 25 mg by mouth 2 (two) times daily.    . ranitidine (ZANTAC) 150 MG tablet Take 150 mg by mouth daily with breakfast.    . senna-docusate (SENEXON-S) 8.6-50 MG per tablet Take 1 tablet by mouth 2 (two) times daily.    . sitaGLIPtin (JANUVIA) 100 MG tablet Take 100 mg by mouth daily with breakfast.    . tiotropium (SPIRIVA) 18 MCG inhalation capsule Place 18 mcg into inhaler and inhale daily.    . verapamil (CALAN) 120 MG tablet Take 120 mg by mouth at bedtime.       Lab Results:  Results for orders placed or performed during the hospital encounter of 03/21/15 (from the past 48 hour(s))  CBG monitoring, ED     Status: Abnormal   Collection Time: 03/21/15  9:43 PM  Result Value Ref Range  Glucose-Capillary 180 (H) 65 - 99 mg/dL  Urine rapid drug screen (hosp performed) (Not at [facility])     Status: None   Collection Time: 03/22/15  8:30 AM  Result Value Ref Range   Opiates NONE DETECTED NONE DETECTED   Cocaine NONE DETECTED NONE DETECTED   Benzodiazepines NONE DETECTED NONE DETECTED   Amphetamines NONE DETECTED NONE DETECTED   Tetrahydrocannabinol NONE DETECTED NONE DETECTED   Barbiturates NONE DETECTED NONE DETECTED    Comment:        DRUG SCREEN FOR MEDICAL PURPOSES ONLY.  IF CONFIRMATION IS NEEDED FOR ANY PURPOSE, NOTIFY LAB WITHIN 5 DAYS.        LOWEST DETECTABLE LIMITS FOR URINE DRUG SCREEN Drug Class       Cutoff (ng/mL) Amphetamine      1000 Barbiturate      200 Benzodiazepine   200 Tricyclics       300 Opiates          300 Cocaine          300 THC              50   CBG monitoring, ED     Status: Abnormal   Collection Time: 03/22/15  9:00 PM  Result Value Ref Range   Glucose-Capillary 115 (H) 65 - 99 mg/dL  CBG monitoring, ED     Status: Abnormal   Collection Time: 03/23/15  8:01 AM  Result Value Ref Range   Glucose-Capillary 129 (H) 65 - 99 mg/dL   Comment 1 Document in Chart   CBG monitoring, ED     Status: Abnormal   Collection Time: 03/23/15 12:05 PM  Result Value Ref Range   Glucose-Capillary 103 (H) 65 - 99 mg/dL   Comment 1 Document in Chart   CBG monitoring, ED     Status: Abnormal   Collection Time: 03/23/15  5:46 PM  Result Value Ref Range   Glucose-Capillary 119 (H) 65 - 99 mg/dL   Comment 1 Document in Chart     Physical Findings: AIMS:  , ,  ,  ,    CIWA:    COWS:     Musculoskeletal: Strength & Muscle Tone: within normal limits Gait & Station: normal Patient leans: N/A  Psychiatric Specialty  Exam: Review of Systems  Constitutional: Negative.   HENT: Negative.   Eyes: Negative.   Respiratory: Negative.   Cardiovascular: Negative.   Gastrointestinal: Negative.   Genitourinary: Negative.   Musculoskeletal: Negative.   Skin: Negative.   Neurological: Negative.   Psychiatric/Behavioral: Positive for hallucinations.    Blood pressure 160/69, pulse 102, temperature 99.2 F (37.3 C), temperature source Oral, resp. rate 15, SpO2 100 %.There is no weight on file to calculate BMI.   General Appearance: Fairly groomed  Patent attorney::  Fair  Speech:  Clear and Coherent and Slow   Volume:  Decreased   Mood:  Depressed   Affect:  Congruent and Flat   Thought Process:  Disorganized   Orientation:  Other:  Oriented to person only   Thought Content:  Auditory hallucinations [hearing music]  Suicidal Thoughts:  No   Homicidal Thoughts:  No   Memory:  Immediate;   Poor Recent;   Poor Remote;   Poor   Judgement:  Poor   Insight:  Lacking   Psychomotor Activity:  Decreased   Concentration:  Poor   Recall:  Poor   Fund of Knowledge:Poor   Language: Poor   Akathisia:  No  Handed:  Right   AIMS (if indicated):      Assets:  Housing Physical Health   ADL's:  Intact   Cognition: Impaired,  Mild   Sleep:       Treatment Plan Summary: Daily contact with patient to assess and evaluate symptoms and progress in treatment and Medication management -Crisis stabilization -Continue home medications  Disposition: Recommend inpatient psychiatric hospitalization when bed available.   Alberteen Sam, FNP-BC [facility] 03/23/2015, 8:28 PM

## 2015-03-23 NOTE — ED Notes (Signed)
Pt awake, alert & responsive, no distress noted.  Denies SI, HI or AV hallucinations.  Monitoring for safety, Q 15 min checks in effect.

## 2015-03-23 NOTE — ED Notes (Addendum)
Pt incontinent of urine x 1 episode, comfort measures given.

## 2015-03-24 DIAGNOSIS — F29 Unspecified psychosis not due to a substance or known physiological condition: Secondary | ICD-10-CM

## 2015-03-24 DIAGNOSIS — F23 Brief psychotic disorder: Secondary | ICD-10-CM | POA: Insufficient documentation

## 2015-03-24 DIAGNOSIS — R45851 Suicidal ideations: Secondary | ICD-10-CM | POA: Diagnosis not present

## 2015-03-24 DIAGNOSIS — F209 Schizophrenia, unspecified: Secondary | ICD-10-CM | POA: Insufficient documentation

## 2015-03-24 DIAGNOSIS — F200 Paranoid schizophrenia: Secondary | ICD-10-CM | POA: Diagnosis not present

## 2015-03-24 LAB — CBG MONITORING, ED: GLUCOSE-CAPILLARY: 104 mg/dL — AB (ref 65–99)

## 2015-03-24 MED ORDER — QUETIAPINE FUMARATE 100 MG PO TABS
100.0000 mg | ORAL_TABLET | Freq: Every day | ORAL | Status: DC
  Administered 2015-03-24 – 2015-03-26 (×3): 100 mg via ORAL
  Filled 2015-03-24 (×3): qty 1

## 2015-03-24 MED ORDER — QUETIAPINE FUMARATE 50 MG PO TABS
50.0000 mg | ORAL_TABLET | Freq: Every day | ORAL | Status: DC
  Administered 2015-03-25 – 2015-03-27 (×3): 50 mg via ORAL
  Filled 2015-03-24 (×3): qty 1

## 2015-03-24 NOTE — ED Notes (Signed)
Patient alert and oriented x3. Patient denies pain, SI/HI/AVH. Patient has had two episodes of incontinence. He has been pleasant and cooperative. Medication was given and education provided.Encouragement was also given. Patient has constantly been asking for a "smoke". He was offered a patch but he denied it.[facility] and [facility] to [facility]. Representative was told the plans for the patient. Patient was receptive of the care and encouragement given.

## 2015-03-24 NOTE — Consult Note (Signed)
[facility]   Reason for Consult:  Suicidal ideations,hallucinations Referring Physician:  EDP Patient Identification: Ronald AngstBarry D Scullion MRN:  [id number] Principal Diagnosis: Paranoid schizophrenia [facility](HCC) Diagnosis:   Patient Active Problem List   Diagnosis Date Noted  . Paranoid schizophrenia (HCC) [F20.0]     Priority: High  . Leukocytosis [D72.829] 02/06/2015  . Sleep apnea [G47.30] 02/05/2015  . AKI (acute kidney injury) (HCC) [N17.9] 02/04/2015  . Seizure disorder (HCC) [G40.909] 02/04/2015  . Acute encephalopathy [G93.40] 02/04/2015  . Diabetes mellitus without complication (HCC) [E11.9]   . Hyperlipidemia [E78.5]   . Hypertension [I10]     Total Time spent with patient: 30 minutes  Subjective:   Ronald Roman is a 54 y.o. male patient admitted with psychosis.  HPI:  On admission:  54 y.o. male who presents accompanied by police reporting symptoms of depression and suicidal ideation. Pt has a history of paranoid schizophrenia and was [facility] states, "respondant is a 54 yo male who was brought to crisis center [facility]. She reports increased bizarre behavior this week including anger control issues, agression, paranoid thinking, and poor judgement. He has not been taking his medications for several days. He is disheveled. He requires insulin and may not be getting his injections. He is a poor historian and cannot account for his activities for the last few days. He is considered a threat to himself.  Pt denies suicidal ideation or past attempts. Pt acknowledges symptoms including decreased sleep, "I haven't been able to sleep at all last night and before the past few days I slept non-stop" Pt dies homicidal ideation or history of violence. Pt denies auditory or visual hallucinations or other psychotic symptoms. Pt denies alcohol or substance abuse.  Pt is unable to state current stressors. Pt states that he lives  alone, and supports include his mom and his daughter. Pt denies history of abuse and trauma. When asked about his work history, pt states, "I am going to work when I get out of here. They are going to put a huge fan over me and it is going to blow on me...." . Pt has limited insight and poor judgement.  [facility]. Pt is unable to state IP history.  Pt is disheveled, alert, disoriented (stating it is Nov 21st) with slurred speech and slowed motor behavior. Eye contact is poor, as pt is lying on stretcher and drowsy. Pt's mood is irritable and affect is paranoid and blunted. Affect is congruent with mood. Thought process is irratic. There is no indication Pt is currently responding to internal stimuli but it appears he is experiencing delusional thought content. Pt was cooperative throughout assessment. Pt is currently unable to contract for safety outside the hospital and wants inpatient psychiatric treatment.  Today:  Patient is taking his medications but still responding to internal stimuli, slow to respond at times due to the voices.    Past Psychiatric History: Schizophrenia, paranoid  Risk to Self: Suicidal Ideation: No Suicidal Intent: No Is patient at risk for suicide?: No Suicidal Plan?: No Access to Means: No What has been your use of drugs/alcohol within the last 12 months?: pt denies Intentional Self Injurious Behavior: None Risk to Others: Homicidal Ideation: No Thoughts of Harm to Others: No Current Homicidal Intent: No Current Homicidal Plan: No Access to Homicidal Means: No History of harm to others?:  (UTA) Assessment of Violence: None Noted Does patient  have access to weapons?: No Criminal Charges Pending?: No Does patient have a court date: No Prior Inpatient Therapy: Prior Inpatient Therapy:  (UTA) Prior Outpatient Therapy: Prior Outpatient Therapy: Yes Prior Therapy Dates: unk Prior Therapy Facilty/Provider(s):  [facility] Does patient have an ACCT team?: Unknown Does patient have Intensive In-House Services?  : Unknown Does patient have [facility] services? : Yes Does patient have P4CC services?: No  Past Medical History:  Past Medical History  Diagnosis Date  . Diabetes mellitus without complication (HCC)   . Hyperlipidemia   . Hypertension   . GERD (gastroesophageal reflux disease)   . Uric acid nephrolithiasis   . Sleep apnea   . Paranoid schizophrenia (HCC)    History reviewed. No pertinent past surgical history. Family History: History reviewed. No pertinent family history. Family Psychiatric  History: unknown Social History:  History  Alcohol Use No     History  Drug Use No    Social History   Social History  . Marital Status: Single    Spouse Name: N/A  . Number of Children: N/A  . Years of Education: N/A   Social History Main Topics  . Smoking status: Current Every Day Smoker -- 1.00 packs/day    Types: Cigarettes  . Smokeless tobacco: None  . Alcohol Use: No  . Drug Use: No  . Sexual Activity: Not Asked   Other Topics Concern  . None   Social History Narrative   Additional Social History:    Pain Medications: deferred Prescriptions: deferred Over the Counter: deferred History of alcohol / drug use?:  (pt denies) Negative Consequences of Use:  (denies) Withdrawal Symptoms:  (denies)                     Allergies:   Allergies  Allergen Reactions  . Cogentin [Benztropine]     Per MAR   . Penicillins     Per MAR   . Prolixin [Fluphenazine]     Per MAR     Labs:  Results for orders placed or performed during the hospital encounter of 03/21/15 (from the past 48 hour(s))  CBG monitoring, ED     Status: Abnormal   Collection Time: 03/22/15  9:00 PM  Result Value Ref Range   Glucose-Capillary 115 (H) 65 - 99 mg/dL  CBG monitoring, ED     Status: Abnormal   Collection Time: 03/23/15  8:01 AM  Result Value Ref Range   Glucose-Capillary 129 (H) 65  - 99 mg/dL   Comment 1 Document in Chart   CBG monitoring, ED     Status: Abnormal   Collection Time: 03/23/15 12:05 PM  Result Value Ref Range   Glucose-Capillary 103 (H) 65 - 99 mg/dL   Comment 1 Document in Chart   CBG monitoring, ED     Status: Abnormal   Collection Time: 03/23/15  5:46 PM  Result Value Ref Range   Glucose-Capillary 119 (H) 65 - 99 mg/dL   Comment 1 Document in Chart   CBG monitoring, ED     Status: Abnormal   Collection Time: 03/24/15  8:07 AM  Result Value Ref Range   Glucose-Capillary 104 (H) 65 - 99 mg/dL    Current Facility-Administered Medications  Medication Dose Route Frequency Provider Last Rate Last Dose  . acetaminophen (TYLENOL) tablet 650 mg  650 mg Oral Q4H PRN [name], MD      . albuterol (PROVENTIL HFA;VENTOLIN HFA) 108 (90 BASE) MCG/ACT inhaler 2 puff  2 puff  Inhalation Q4H PRN [name], MD      . alum & mag hydroxide-simeth (MAALOX/MYLANTA) 200-200-20 MG/5ML suspension 30 mL  30 mL Oral PRN [name], MD      . atorvastatin (LIPITOR) tablet 40 mg  40 mg Oral q1800 [name], MD   40 mg at 03/23/15 1911  . cloZAPine (CLOZARIL) tablet 100 mg  100 mg Oral BID [name], MD   100 mg at 03/24/15 1610  . divalproex (DEPAKOTE) DR tablet 1,000 mg  1,000 mg Oral QHS [name], MD   1,000 mg at 03/23/15 2129  . divalproex (DEPAKOTE) DR tablet 250 mg  250 mg Oral QHS [name], MD   250 mg at 03/23/15 2136  . famotidine (PEPCID) tablet 20 mg  20 mg Oral BID [name], MD   20 mg at 03/24/15 0956  . ibuprofen (ADVIL,MOTRIN) tablet 600 mg  600 mg Oral Q8H PRN [name], MD      . linagliptin (TRADJENTA) tablet 5 mg  5 mg Oral Daily [name], MD   5 mg at 03/24/15 9604  . LORazepam (ATIVAN) tablet 0.5 mg  0.5 mg Oral BID [name], MD   0.5 mg at 03/24/15 5409  . magnesium oxide (MAG-OX) tablet 400 mg  400 mg Oral BID [name], MD   400 mg at 03/24/15 0955  . metFORMIN (GLUCOPHAGE) tablet 1,000 mg  1,000 mg  Oral BID WC [name], MD   1,000 mg at 03/24/15 0815  . metoprolol tartrate (LOPRESSOR) tablet 25 mg  25 mg Oral BID [name], MD   25 mg at 03/24/15 8119  . nicotine (NICODERM CQ - dosed in mg/24 hours) patch 21 mg  21 mg Transdermal Daily PRN [name], MD      . ondansetron [facility] tablet 4 mg  4 mg Oral Q8H PRN [name], MD      . QUEtiapine (SEROQUEL) tablet 50 mg  50 mg Oral BID [name], MD   50 mg at 03/24/15 0951  . tiotropium (SPIRIVA) inhalation capsule 18 mcg  18 mcg Inhalation Daily [name], MD   18 mcg at 03/24/15 0815  . verapamil (CALAN) tablet 120 mg  120 mg Oral QHS [name], MD   120 mg at 03/23/15 2130  . zolpidem (AMBIEN) tablet 5 mg  5 mg Oral QHS PRN [name], MD   5 mg at 03/21/15 2145   Current Outpatient Prescriptions  Medication Sig Dispense Refill  . cloZAPine (CLOZARIL) 100 MG tablet Take 1 tablet (100 mg total) by mouth 2 (two) times daily. (Patient taking differently: Take 200 mg by mouth 2 (two) times daily. ) 60 tablet 0  . divalproex (DEPAKOTE) 500 MG DR tablet Take 1,000 mg by mouth at bedtime.    Marland Kitchen LORazepam (ATIVAN) 0.5 MG tablet Take 0.5 mg by mouth 2 (two) times daily.    . QUEtiapine (SEROQUEL) 100 MG tablet Take 100 mg by mouth 2 (two) times daily.    . QUEtiapine (SEROQUEL) 50 MG tablet Take 1 tablet (50 mg total) by mouth 2 (two) times daily. (Patient taking differently: Take 25 mg by mouth 2 (two) times daily. ) 60 tablet 0  . albuterol (PROVENTIL HFA;VENTOLIN HFA) 108 (90 BASE) MCG/ACT inhaler Inhale 2 puffs into the lungs every 4 (four) hours as needed for shortness of breath.    Marland Kitchen atorvastatin (LIPITOR) 40 MG tablet Take 40 mg by mouth daily with breakfast.    . divalproex (DEPAKOTE) 250  MG DR tablet Take 250 mg by mouth at bedtime.    . magnesium oxide (MAG-OX) 400 MG tablet Take 400 mg by mouth 2 (two) times daily.    . metFORMIN (GLUCOPHAGE) 1000 MG tablet Take 1,000 mg by mouth 2 (two) times daily with a  meal.    . metoprolol tartrate (LOPRESSOR) 25 MG tablet Take 25 mg by mouth 2 (two) times daily.    . ranitidine (ZANTAC) 150 MG tablet Take 150 mg by mouth daily with breakfast.    . senna-docusate (SENEXON-S) 8.6-50 MG per tablet Take 1 tablet by mouth 2 (two) times daily.    . sitaGLIPtin (JANUVIA) 100 MG tablet Take 100 mg by mouth daily with breakfast.    . tiotropium (SPIRIVA) 18 MCG inhalation capsule Place 18 mcg into inhaler and inhale daily.    . verapamil (CALAN) 120 MG tablet Take 120 mg by mouth at bedtime.      Musculoskeletal: Strength & Muscle Tone: within normal limits Gait & Station: normal Patient leans: N/A  Psychiatric Specialty Exam: Review of Systems  Constitutional: Negative.   HENT: Negative.   Eyes: Negative.   Respiratory: Negative.   Cardiovascular: Negative.   Gastrointestinal: Negative.   Genitourinary: Negative.   Musculoskeletal: Negative.   Skin: Negative.   Neurological: Negative.   Endo/Heme/Allergies: Negative.   Psychiatric/Behavioral: Positive for hallucinations.    Blood pressure 131/61, pulse 88, temperature 98.4 F (36.9 C), temperature source Oral, resp. rate 18, SpO2 100 %.There is no weight on file to calculate BMI.  General Appearance: Casual  Eye Contact::  Fair  Speech:  Slow  Volume:  Normal  Mood:  Depressed  Affect:  Flat  Thought Process:  Coherent  Orientation:  Full (Time, Place, and Person)  Thought Content:  Hallucinations: Auditory Visual  Suicidal Thoughts:  Yes.  without intent/plan  Homicidal Thoughts:  No  Memory:  Immediate;   Fair Recent;   Fair Remote;   Fair  Judgement:  Impaired  Insight:  Fair  Psychomotor Activity:  Decreased  Concentration:  Fair  Recall:  [employer] of Knowledge:Fair  Language: Fair  Akathisia:  No  Handed:  Right  AIMS (if indicated):     Assets:  Housing Leisure Time Physical Health Resilience Social Support  ADL's:  Intact  Cognition: Impaired,  Mild  Sleep:       Treatment Plan Summary: Daily contact with patient to assess and evaluate symptoms and progress in treatment, Medication management and Plan shizophrenia, paranoia with exacerbation: -Crisis stabilization -Medication management:  Depkote 1250 mg at bedtime for mood stabilization, Ativan 0.5 mg BID anxiety, Seroquel 50 mg BID for psychosis increased to 100 mg at bedtime, Clozaril 100 mg BID for suicidal ideations and psychosis continues -Individual counseling  Disposition: Recommend psychiatric Inpatient admission when medically cleared.  Nanine Means, PMH-NP 03/24/2015 12:17 PM Patient seen face-to-face for psychiatric evaluation, chart reviewed and case discussed with the physician extender and developed treatment plan. Reviewed the information documented and agree with the treatment plan. Thedore Mins, MD

## 2015-03-24 NOTE — BH Assessment (Addendum)
[facility] Assessment Progress Note  The following facilities have been contacted to seek placement for this pt, with results as noted:  Beds available, information sent, decision pending:  [name]   No beds available, but accepting referrals for future consideration; information sent:  [facility]   At capacity:  [facility] Roman   Ronald Roman, [place] Triage Specialist [phone]

## 2015-03-24 NOTE — ED Notes (Signed)
[facility] plan and AH but denies HI and VH. Patient states that he hears a woman and sing inside of him. Patient states [facility] Plan care discussed with patient and patient voices no complaints or concerns at this time. Encouragement and support provided and safety maintain. Q 15 min safety checks remain in place.

## 2015-03-25 LAB — CBG MONITORING, ED
Glucose-Capillary: 120 mg/dL — ABNORMAL HIGH (ref 65–99)
Glucose-Capillary: 90 mg/dL (ref 65–99)
Glucose-Capillary: 94 mg/dL (ref 65–99)
Glucose-Capillary: 135 mg/dL — ABNORMAL HIGH (ref 65–99)

## 2015-03-25 MED ORDER — INSULIN GLARGINE 100 UNIT/ML ~~LOC~~ SOLN
10.0000 [IU] | Freq: Every day | SUBCUTANEOUS | Status: DC
Start: 2015-03-25 — End: 2015-03-27
  Administered 2015-03-25 – 2015-03-26 (×2): 10 [IU] via SUBCUTANEOUS
  Filled 2015-03-25 (×4): qty 0.1

## 2015-03-25 NOTE — BH Assessment (Signed)
[facility] Assessment Progress Note  The following facilities have been contacted to seek placement for this pt, with results as noted:  Beds available, information sent, decision pending:  [facility]   Declined:  [facility] (due to pt acuity)   At capacity:  [facility]   Tarvares Lant, [place] Triage Specialist [phone]

## 2015-03-25 NOTE — BH Assessment (Signed)
03/25/2015 Reassessment:   Psychiatry asked this writer to re-assess patient. Writer attempted to assess this patient but he was not arousable. Writer called patient's name 3x's with no response. Patient did not move and had covers over his head. Writer will re-attempt to assess patient prior to shift ending. However, if patient is not able to be assessed by the end of shift this writer will pass to oncoming staff.

## 2015-03-25 NOTE — ED Notes (Signed)
Pt is calm and cooperative.  Denies SI, HI, and AVH.  15 minute checks and video monitoring continue.

## 2015-03-25 NOTE — ED Notes (Signed)
Patient observed watching television. He was alert and attentive to conversation with this Clinical research associatewriter but did not seem to stay focused on what was being discussed. Patient unable to tell why he is here instead talked about his daughters. Q 15 minute checks in progress and monitoring continues.

## 2015-03-26 DIAGNOSIS — R45851 Suicidal ideations: Secondary | ICD-10-CM | POA: Diagnosis not present

## 2015-03-26 DIAGNOSIS — F200 Paranoid schizophrenia: Secondary | ICD-10-CM | POA: Diagnosis not present

## 2015-03-26 LAB — CBG MONITORING, ED
GLUCOSE-CAPILLARY: 117 mg/dL — AB (ref 65–99)
GLUCOSE-CAPILLARY: 113 mg/dL — AB (ref 65–99)
Glucose-Capillary: 66 mg/dL (ref 65–99)
Glucose-Capillary: 90 mg/dL (ref 65–99)

## 2015-03-26 NOTE — Progress Notes (Deleted)
Pt with fall Pt with neck brace intact Male family member at bedside Pt confirms with CM she has at home a walker, w/c, "all equipment I need"  

## 2015-03-26 NOTE — ED Notes (Signed)
Pt is alert and oriented.  Denies SI, HI, AVH.  Pt. Is sleeping most of day.  Denies pain or discomfort.  15 minute checks and video monitoring in effect.
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
## 2015-03-26 NOTE — Consult Note (Addendum)
[facility]   Reason for Consult:  Suicidal ideations,hallucinations Referring Physician:  EDP Patient Identification: Ronald AngstBarry D Roman MRN:  [id number] Principal Diagnosis: Paranoid schizophrenia [employer](HCC) Diagnosis:   Patient Active Problem List   Diagnosis Date Noted  . Paranoid schizophrenia (HCC) [F20.0]     Priority: Medium  . Acute psychosis [F29]   . Leukocytosis [D72.829] 02/06/2015  . Sleep apnea [G47.30] 02/05/2015  . AKI (acute kidney injury) (HCC) [N17.9] 02/04/2015  . Seizure disorder (HCC) [G40.909] 02/04/2015  . Acute encephalopathy [G93.40] 02/04/2015  . Diabetes mellitus without complication (HCC) [E11.9]   . Hyperlipidemia [E78.5]   . Hypertension [I10]     Total Time spent with patient: 30 minutes  Subjective:   Ronald AngstBarry D Roman is a 54 y.o. male patient admitted with psychosis. Today, states "I feel so good, feel like I could run a marathon."  HPI:  On admission:  54 y.o. male who presents accompanied by police reporting symptoms of depression and suicidal ideation. Pt has a history of paranoid schizophrenia and was [facility] by [facility] states, "respondant is a 54 yo male who was brought to crisis center by a caretaker form [facility]. She reports increased bizarre behavior this week including anger control issues, agression, paranoid thinking, and poor judgement. He has not been taking his medications for several days. He is disheveled. He requires insulin and may not be getting his injections. He is a poor historian and cannot account for his activities for the last few days. He is considered a threat to himself.  Pt denies suicidal ideation or past attempts. Pt acknowledges symptoms including decreased sleep, "I haven't been able to sleep at all last night and before the past few days I slept non-stop" Pt dies homicidal ideation or history of violence. Pt denies auditory or visual hallucinations or other psychotic symptoms. Pt denies  alcohol or substance abuse.  Ronald PrayBarry is compliant with medication regimen here. He states his sleep is good. His appetite is good.  He denies suicidal or homicidal ideation, intent or plan. He denies AVH. He states he has 2 case workers, Designer, multimediaannette and Melanie through [facility].    Past Psychiatric History: Schizophrenia, paranoid  Risk to Self: Suicidal Ideation: No Suicidal Intent: No Is patient at risk for suicide?: No Suicidal Plan?: No Access to Means: No What has been your use of drugs/alcohol within the last 12 months?: pt denies Intentional Self Injurious Behavior: None Risk to Others: Homicidal Ideation: No Thoughts of Harm to Others: No Current Homicidal Intent: No Current Homicidal Plan: No Access to Homicidal Means: No History of harm to others?:  (UTA) Assessment of Violence: None Noted Does patient have access to weapons?: No Criminal Charges Pending?: No Does patient have a court date: No Prior Inpatient Therapy: Prior Inpatient Therapy:  (UTA) Prior Outpatient Therapy: Prior Outpatient Therapy: Yes Prior Therapy Dates: unk Prior Therapy Facilty/Provider(s): [facility] Does patient have an ACCT team?: Unknown Does patient have Intensive In-House Services?  : Unknown Does patient have [facility] services? : Yes Does patient have P4CC services?: No  Past Medical History:  Past Medical History  Diagnosis Date  . Diabetes mellitus without complication (HCC)   . Hyperlipidemia   . Hypertension   . GERD (gastroesophageal reflux disease)   . Uric acid nephrolithiasis   . Sleep apnea   . Paranoid schizophrenia (HCC)    History reviewed. No pertinent past surgical history. Family History: History reviewed. No pertinent family history. Family Psychiatric  History: unknown Social History:  History  Alcohol Use No     History  Drug Use No    Social History   Social History  . Marital Status: Single    Spouse Name: N/A  . Number of Children: N/A  . Years of Education:  N/A   Social History Main Topics  . Smoking status: Current Every Day Smoker -- 1.00 packs/day    Types: Cigarettes  . Smokeless tobacco: None  . Alcohol Use: No  . Drug Use: No  . Sexual Activity: Not Asked   Other Topics Concern  . None   Social History Narrative   Additional Social History:    Pain Medications: deferred Prescriptions: deferred Over the Counter: deferred History of alcohol / drug use?:  (pt denies) Negative Consequences of Use:  (denies) Withdrawal Symptoms:  (denies)     Allergies:   Allergies  Allergen Reactions  . Cogentin [Benztropine]     Per MAR   . Penicillins     Per MAR - unable to verify patients PCN reaction   . Prolixin [Fluphenazine]     Per [facility]     Labs:  Results for orders placed or performed during the hospital encounter of 03/21/15 (from the past 48 hour(s))  CBG monitoring, ED     Status: Abnormal   Collection Time: 03/25/15  7:50 AM  Result Value Ref Range   Glucose-Capillary 120 (H) 65 - 99 mg/dL  CBG monitoring, ED     Status: None   Collection Time: 03/25/15 12:48 PM  Result Value Ref Range   Glucose-Capillary 90 65 - 99 mg/dL  CBG monitoring, ED     Status: None   Collection Time: 03/25/15  6:04 PM  Result Value Ref Range   Glucose-Capillary 94 65 - 99 mg/dL  CBG monitoring, ED     Status: Abnormal   Collection Time: 03/25/15  8:59 PM  Result Value Ref Range   Glucose-Capillary 135 (H) 65 - 99 mg/dL  CBG monitoring, ED     Status: Abnormal   Collection Time: 03/26/15  8:33 AM  Result Value Ref Range   Glucose-Capillary 117 (H) 65 - 99 mg/dL  CBG monitoring, ED     Status: None   Collection Time: 03/26/15 12:12 PM  Result Value Ref Range   Glucose-Capillary 66 65 - 99 mg/dL    Current Facility-Administered Medications  Medication Dose Route Frequency Provider Last Rate Last Dose  . acetaminophen (TYLENOL) tablet 650 mg  650 mg Oral Q4H PRN [name], MD      . albuterol (PROVENTIL HFA;VENTOLIN HFA) 108 (90  BASE) MCG/ACT inhaler 2 puff  2 puff Inhalation Q4H PRN [name], MD      . alum & mag hydroxide-simeth (MAALOX/MYLANTA) 200-200-20 MG/5ML suspension 30 mL  30 mL Oral PRN [name], MD      . atorvastatin (LIPITOR) tablet 40 mg  40 mg Oral q1800 [name], MD   40 mg at 03/25/15 1754  . cloZAPine (CLOZARIL) tablet 100 mg  100 mg Oral BID [name], MD   100 mg at 03/26/15 1038  . divalproex (DEPAKOTE) DR tablet 1,000 mg  1,000 mg Oral QHS [name], MD   1,000 mg at 03/25/15 2204  . divalproex (DEPAKOTE) DR tablet 250 mg  250 mg Oral QHS [name], MD   250 mg at 03/25/15 2205  . famotidine (PEPCID) tablet 20 mg  20 mg Oral BID [name], MD   20 mg at 03/26/15 1038  . ibuprofen (  ADVIL,MOTRIN) tablet 600 mg  600 mg Oral Q8H PRN [name], MD      . insulin glargine (LANTUS) injection 10 Units  10 Units Subcutaneous QHS Charm Rings, NP   10 Units at 03/25/15 2210  . linagliptin (TRADJENTA) tablet 5 mg  5 mg Oral Daily [name], MD   5 mg at 03/26/15 1037  . LORazepam (ATIVAN) tablet 0.5 mg  0.5 mg Oral BID [name], MD   0.5 mg at 03/26/15 1038  . magnesium oxide (MAG-OX) tablet 400 mg  400 mg Oral BID [name], MD   400 mg at 03/26/15 1038  . metFORMIN (GLUCOPHAGE) tablet 1,000 mg  1,000 mg Oral BID WC [name], MD   1,000 mg at 03/26/15 0845  . metoprolol tartrate (LOPRESSOR) tablet 25 mg  25 mg Oral BID [name], MD   25 mg at 03/26/15 1037  . nicotine (NICODERM CQ - dosed in mg/24 hours) patch 21 mg  21 mg Transdermal Daily PRN [name], MD      . ondansetron [facility]) tablet 4 mg  4 mg Oral Q8H PRN [name], MD      . QUEtiapine (SEROQUEL) tablet 100 mg  100 mg Oral QHS Charm Rings, NP   100 mg at 03/25/15 2202  . QUEtiapine (SEROQUEL) tablet 50 mg  50 mg Oral Daily Charm Rings, NP   50 mg at 03/26/15 0845  . tiotropium (SPIRIVA) inhalation capsule 18 mcg  18 mcg Inhalation Daily [name], MD   18 mcg at 03/26/15  0845  . verapamil (CALAN) tablet 120 mg  120 mg Oral QHS [name], MD   120 mg at 03/25/15 2203  . zolpidem (AMBIEN) tablet 5 mg  5 mg Oral QHS PRN [name], MD   5 mg at 03/21/15 2145   Current Outpatient Prescriptions  Medication Sig Dispense Refill  . albuterol (PROVENTIL HFA;VENTOLIN HFA) 108 (90 BASE) MCG/ACT inhaler Inhale 2 puffs into the lungs every 4 (four) hours as needed for shortness of breath.    Marland Kitchen atorvastatin (LIPITOR) 40 MG tablet Take 40 mg by mouth daily with breakfast.    . cloZAPine (CLOZARIL) 100 MG tablet Take 1 tablet (100 mg total) by mouth 2 (two) times daily. 60 tablet 0  . divalproex (DEPAKOTE) 250 MG DR tablet Take 250 mg by mouth at bedtime.    . divalproex (DEPAKOTE) 500 MG DR tablet Take 1,000 mg by mouth at bedtime.    . hydrochlorothiazide (MICROZIDE) 12.5 MG capsule Take 12.5 mg by mouth daily.    . insulin glargine (LANTUS) 100 UNIT/ML injection Inject 10 Units into the skin at bedtime.    Marland Kitchen lisinopril (PRINIVIL,ZESTRIL) 10 MG tablet Take 20 mg by mouth daily.     Marland Kitchen LORazepam (ATIVAN) 0.5 MG tablet Take 0.5 mg by mouth 2 (two) times daily.    . magnesium oxide (MAG-OX) 400 MG tablet Take 400 mg by mouth 2 (two) times daily.    . metFORMIN (GLUCOPHAGE) 1000 MG tablet Take 1,000 mg by mouth 2 (two) times daily with a meal.    . metoprolol tartrate (LOPRESSOR) 25 MG tablet Take 25 mg by mouth 2 (two) times daily.    . QUEtiapine (SEROQUEL) 100 MG tablet Take 100 mg by mouth 2 (two) times daily.    . QUEtiapine (SEROQUEL) 50 MG tablet Take 1 tablet (50 mg total) by mouth 2 (two) times daily. (Patient taking differently: Take by mouth 2 (two) times daily. )  60 tablet 0  . ranitidine (ZANTAC) 150 MG tablet Take 150 mg by mouth daily with breakfast.    . senna-docusate (SENEXON-S) 8.6-50 MG per tablet Take 1 tablet by mouth 2 (two) times daily.    . sitaGLIPtin (JANUVIA) 100 MG tablet Take 100 mg by mouth daily with breakfast.    . tiotropium (SPIRIVA) 18  MCG inhalation capsule Place 18 mcg into inhaler and inhale daily.    . verapamil (CALAN) 120 MG tablet Take 120 mg by mouth at bedtime.      Musculoskeletal: Strength & Muscle Tone: within normal limits Gait & Station: normal Patient leans: N/A  Psychiatric Specialty Exam: Review of Systems  Constitutional: Negative.   HENT: Negative.   Eyes: Negative.   Respiratory: Negative.   Cardiovascular: Negative.   Gastrointestinal: Negative.   Genitourinary: Negative.   Musculoskeletal: Negative.   Skin: Negative.   Neurological: Negative.   Endo/Heme/Allergies: Negative.   Psychiatric/Behavioral: Positive for hallucinations.    Blood pressure 135/83, pulse 94, temperature 98 F (36.7 C), temperature source Oral, resp. rate 16, SpO2 100 %.There is no weight on file to calculate BMI.  General Appearance: Casual  Eye Contact::  Fair  Speech:  Slow  Volume:  Normal  Mood:  Depressed  Affect:  Flat  Thought Process:  Coherent  Orientation:  Full (Time, Place, and Person)  Thought Content:  Hallucinations: Auditory Visual  Suicidal Thoughts:  Yes.  without intent/plan  Homicidal Thoughts:  No  Memory:  Immediate;   Fair Recent;   Fair Remote;   Fair  Judgement:  Impaired  Insight:  Fair  Psychomotor Activity:  Decreased  Concentration:  Fair  Recall:  [employer] of Knowledge:Fair  Language: Fair  Akathisia:  No  Handed:  Right  AIMS (if indicated):     Assets:  Housing Leisure Time Physical Health Resilience Social Support  ADL's:  Intact  Cognition: Impaired,  Mild  Sleep:      Treatment Plan Summary: Daily contact with patient to assess and evaluate symptoms and progress in treatment, Medication management and Plan shizophrenia, paranoia with exacerbation: -Crisis stabilization -Medication management:  Depkote 1250 mg at bedtime for mood stabilization, Ativan 0.5 mg BID anxiety, Seroquel 50 mg BID for psychosis increased to 100 mg at bedtime, Clozaril 100 mg BID  for suicidal ideations and psychosis continues  Disposition: Recommend psychiatric Inpatient admission when medically cleared. Will have social work Event organiser to determine patient's baseline.   Alberteen Sam, FNP-BC [facility] 03/26/2015 1:29 PM  Patient seen face-to-face for psychiatric evaluation, chart reviewed and case discussed with the physician extender and developed treatment plan. Reviewed the information documented and agree with the treatment plan. Thedore Mins, MD

## 2015-03-26 NOTE — Progress Notes (Signed)
Pt is active with [facility] per Ronald Roman [facility] home care coordinator

## 2015-03-27 DIAGNOSIS — F29 Unspecified psychosis not due to a substance or known physiological condition: Secondary | ICD-10-CM | POA: Diagnosis not present

## 2015-03-27 DIAGNOSIS — F200 Paranoid schizophrenia: Secondary | ICD-10-CM | POA: Diagnosis not present

## 2015-03-27 LAB — CBC WITH DIFFERENTIAL/PLATELET
BASOS ABS: 0 10*3/uL (ref 0.0–0.1)
BASOS PCT: 0 %
EOS ABS: 0.1 10*3/uL (ref 0.0–0.7)
EOS PCT: 1 %
HCT: 42.6 % (ref 39.0–52.0)
HEMOGLOBIN: 13.8 g/dL (ref 13.0–17.0)
LYMPHS ABS: 2.4 10*3/uL (ref 0.7–4.0)
Lymphocytes Relative: 26 %
MCH: 26.6 pg (ref 26.0–34.0)
MCHC: 32.4 g/dL (ref 30.0–36.0)
MCV: 82.2 fL (ref 78.0–100.0)
Monocytes Absolute: 0.5 10*3/uL (ref 0.1–1.0)
Monocytes Relative: 6 %
NEUTROS PCT: 67 %
Neutro Abs: 6.3 10*3/uL (ref 1.7–7.7)
PLATELETS: 382 10*3/uL (ref 150–400)
RBC: 5.18 MIL/uL (ref 4.22–5.81)
RDW: 14.8 % (ref 11.5–15.5)
WBC: 9.4 10*3/uL (ref 4.0–10.5)

## 2015-03-27 LAB — CBG MONITORING, ED
GLUCOSE-CAPILLARY: 104 mg/dL — AB (ref 65–99)
GLUCOSE-CAPILLARY: 95 mg/dL (ref 65–99)

## 2015-03-27 MED ORDER — LORAZEPAM 0.5 MG PO TABS: 0.5000 mg | ORAL_TABLET | Freq: Two times a day (BID) | ORAL | Status: DC

## 2015-03-27 MED ORDER — QUETIAPINE FUMARATE 50 MG PO TABS: 50.0000 mg | ORAL_TABLET | Freq: Every day | ORAL | Status: DC

## 2015-03-27 MED ORDER — DIVALPROEX SODIUM 250 MG PO DR TAB: 250.0000 mg | DELAYED_RELEASE_TABLET | Freq: Every day | ORAL | Status: DC

## 2015-03-27 MED ORDER — ZOLPIDEM TARTRATE 5 MG PO TABS: 5.0000 mg | ORAL_TABLET | Freq: Every evening | ORAL | Status: DC | PRN

## 2015-03-27 MED ORDER — QUETIAPINE FUMARATE 100 MG PO TABS: 100.0000 mg | ORAL_TABLET | Freq: Every day | ORAL | Status: DC

## 2015-03-27 MED ORDER — DIVALPROEX SODIUM 500 MG PO DR TAB: 1000.0000 mg | DELAYED_RELEASE_TABLET | Freq: Every day | ORAL | Status: DC

## 2015-03-27 MED ORDER — CLOZAPINE 100 MG PO TABS: 100.0000 mg | ORAL_TABLET | Freq: Two times a day (BID) | ORAL | Status: DC

## 2015-03-27 NOTE — [facility] Suicide Risk Assessment (Signed)
Suicide Risk Assessment  Discharge Assessment   [facility] Discharge Suicide Risk Assessment   Demographic Factors:  Male and Living alone  Total Time spent with patient: 30 minutes  Musculoskeletal: Strength & Muscle Tone: within normal limits Gait & Station: normal Patient leans: N/A  Psychiatric Specialty Exam: Review of Systems  Constitutional: Negative.   HENT: Negative.   Eyes: Negative.   Respiratory: Negative.   Cardiovascular: Negative.   Gastrointestinal: Negative.   Genitourinary: Negative.   Musculoskeletal: Negative.   Skin: Negative.   Neurological: Negative.   Endo/Heme/Allergies: Negative.   Psychiatric/Behavioral:       Denies     Blood pressure 154/85, pulse 93, temperature 98.6 F (37 C), temperature source Oral, resp. rate 17, SpO2 99 %.There is no weight on file to calculate BMI.  General Appearance: Casual  Eye Contact::  Good  Speech:  Normal Rate  Volume:  Normal  Mood:  Euthymic  Affect:  Flat  Thought Process:  Coherent  Orientation:  Full (Time, Place, and Person)  Thought Content:  WDL  Suicidal Thoughts:  No  Homicidal Thoughts:  No  Memory:  Immediate;   Fair Recent;   Fair Remote;   Fair  Judgement:  Fair  Insight:  Fair  Psychomotor Activity:  Normal  Concentration:  Good  Recall:  [employer] of Knowledge:Fair  Language: Good  Akathisia:  No  Handed:  Right  AIMS (if indicated):     Assets:  Housing Leisure Time Physical Health Resilience Social Support  ADL's:  Intact  Cognition: WNL  Sleep:         Has this patient used any form of tobacco in the last 30 days? (Cigarettes, Smokeless Tobacco, Cigars, and/or Pipes) No  Mental Status Per Nursing Assessment::   On Admission:   Psychosis  Current Mental Status by Physician: NA  Loss Factors: NA  Historical Factors: NA  Risk Reduction Factors:   Sense of responsibility to family, Positive social support and Positive therapeutic relationship  Continued Clinical  Symptoms:  None  Cognitive Features That Contribute To Risk:  None    Suicide Risk:  Minimal: No identifiable suicidal ideation.  Patients presenting with no risk factors but with morbid ruminations; may be classified as minimal risk based on the severity of the depressive symptoms  Principal Problem: Paranoid schizophrenia [facility]) Discharge Diagnoses:  Patient Active Problem List   Diagnosis Date Noted  . Paranoid schizophrenia (HCC) [F20.0]     Priority: High  . Acute psychosis [F29]   . Leukocytosis [D72.829] 02/06/2015  . Sleep apnea [G47.30] 02/05/2015  . AKI (acute kidney injury) (HCC) [N17.9] 02/04/2015  . Seizure disorder (HCC) [G40.909] 02/04/2015  . Acute encephalopathy [G93.40] 02/04/2015  . Diabetes mellitus without complication (HCC) [E11.9]   . Hyperlipidemia [E78.5]   . Hypertension [I10]     Follow-up Information    Follow up with Medicaid patient .   Why:  Medicaid transportation Please call [phone] or your local DSS [phone]   Contact information:   As needed---As a medicaid patient it is your responsibility to contact DSS each time you move from county to county, state to state so that you may be reassigned doctors and get mail appropriately      Call [facility].   Why:  As needed- To restart services wtih Genevieve Norlander call when you return home    Contact information:   [address] [name] [address] [phone]  Plan Of Care/Follow-up recommendations:  Activity:  as tolerated Diet:  heart healthy diet  Is patient on multiple antipsychotic therapies at discharge:  No   Has Patient had three or more failed trials of antipsychotic monotherapy by history:  No  Recommended Plan for Multiple Antipsychotic Therapies: NA    Greenlee Ancheta, PMH-NP 03/27/2015, 11:14 AM

## 2015-03-27 NOTE — Consult Note (Signed)
[facility] Face-to-Face Psychiatry Consult   Reason for Consult:  Psychosis Referring Physician:  EDP Patient Identification: Ronald Roman MRN:  [id number] Principal Diagnosis: Paranoid schizophrenia [facility]) Diagnosis:   Patient Active Problem List   Diagnosis Date Noted  . Paranoid schizophrenia (HCC) [F20.0]     Priority: High  . Acute psychosis [F29]   . Leukocytosis [D72.829] 02/06/2015  . Sleep apnea [G47.30] 02/05/2015  . AKI (acute kidney injury) (HCC) [N17.9] 02/04/2015  . Seizure disorder (HCC) [G40.909] 02/04/2015  . Acute encephalopathy [G93.40] 02/04/2015  . Diabetes mellitus without complication (HCC) [E11.9]   . Hyperlipidemia [E78.5]   . Hypertension [I10]     Total Time spent with patient: 30 minutes  Subjective:   Ronald Roman is a 54 y.o. male patient does not warrant admission.  HPI:  54 yo male who presented to the ED with psychosis and not taking his medications.  He was restarted on his medications and stabilized.  Denies suicidal/homicidal ideations, hallucinations, and alcohol/drug abuse.  Stable for discharge, his case worker will come and get him.  Past Psychiatric History: Schizophrenia  Risk to Self: Suicidal Ideation: No Suicidal Intent: No Is patient at risk for suicide?: No Suicidal Plan?: No Access to Means: No What has been your use of drugs/alcohol within the last 12 months?: pt denies Intentional Self Injurious Behavior: None Risk to Others: Homicidal Ideation: No Thoughts of Harm to Others: No Current Homicidal Intent: No Current Homicidal Plan: No Access to Homicidal Means: No History of harm to others?:  (UTA) Assessment of Violence: None Noted Does patient have access to weapons?: No Criminal Charges Pending?: No Does patient have a court date: No Prior Inpatient Therapy: Prior Inpatient Therapy:  (UTA) Prior Outpatient Therapy: Prior Outpatient Therapy: Yes Prior Therapy Dates: unk Prior Therapy Facilty/Provider(s):  [facility] Does patient have an ACCT team?: Unknown Does patient have Intensive In-House Services?  : Unknown Does patient have [facility] services? : Yes Does patient have P4CC services?: No  Past Medical History:  Past Medical History  Diagnosis Date  . Diabetes mellitus without complication (HCC)   . Hyperlipidemia   . Hypertension   . GERD (gastroesophageal reflux disease)   . Uric acid nephrolithiasis   . Sleep apnea   . Paranoid schizophrenia (HCC)    History reviewed. No pertinent past surgical history. Family History: History reviewed. No pertinent family history. Family Psychiatric  History: Unknown Social History:  History  Alcohol Use No     History  Drug Use No    Social History   Social History  . Marital Status: Single    Spouse Name: N/A  . Number of Children: N/A  . Years of Education: N/A   Social History Main Topics  . Smoking status: Current Every Day Smoker -- 1.00 packs/day    Types: Cigarettes  . Smokeless tobacco: None  . Alcohol Use: No  . Drug Use: No  . Sexual Activity: Not Asked   Other Topics Concern  . None   Social History Narrative   Additional Social History:    Pain Medications: deferred Prescriptions: deferred Over the Counter: deferred History of alcohol / drug use?:  (pt denies) Negative Consequences of Use:  (denies) Withdrawal Symptoms:  (denies)                     Allergies:   Allergies  Allergen Reactions  . Cogentin [Benztropine]     Per MAR   . Penicillins     Per  MAR - unable to verify patients PCN reaction   . Prolixin [Fluphenazine]     Per MAR     Labs:  Results for orders placed or performed during the hospital encounter of 03/21/15 (from the past 48 hour(s))  CBG monitoring, ED     Status: None   Collection Time: 03/25/15 12:48 PM  Result Value Ref Range   Glucose-Capillary 90 65 - 99 mg/dL  CBG monitoring, ED     Status: None   Collection Time: 03/25/15  6:04 PM  Result Value Ref Range    Glucose-Capillary 94 65 - 99 mg/dL  CBG monitoring, ED     Status: Abnormal   Collection Time: 03/25/15  8:59 PM  Result Value Ref Range   Glucose-Capillary 135 (H) 65 - 99 mg/dL  CBG monitoring, ED     Status: Abnormal   Collection Time: 03/26/15  8:33 AM  Result Value Ref Range   Glucose-Capillary 117 (H) 65 - 99 mg/dL  CBG monitoring, ED     Status: None   Collection Time: 03/26/15 12:12 PM  Result Value Ref Range   Glucose-Capillary 66 65 - 99 mg/dL  CBG monitoring, ED     Status: Abnormal   Collection Time: 03/26/15  5:37 PM  Result Value Ref Range   Glucose-Capillary 113 (H) 65 - 99 mg/dL  CBG monitoring, ED     Status: None   Collection Time: 03/26/15  9:27 PM  Result Value Ref Range   Glucose-Capillary 90 65 - 99 mg/dL  CBG monitoring, ED     Status: Abnormal   Collection Time: 03/27/15  8:06 AM  Result Value Ref Range   Glucose-Capillary 104 (H) 65 - 99 mg/dL  CBC with Differential/Platelet     Status: None   Collection Time: 03/27/15  8:45 AM  Result Value Ref Range   WBC 9.4 4.0 - 10.5 K/uL   RBC 5.18 4.22 - 5.81 MIL/uL   Hemoglobin 13.8 13.0 - 17.0 g/dL   HCT 96.0 45.4 - 09.8 %   MCV 82.2 78.0 - 100.0 fL   MCH 26.6 26.0 - 34.0 pg   MCHC 32.4 30.0 - 36.0 g/dL   RDW 11.9 14.7 - 82.9 %   Platelets 382 150 - 400 K/uL   Neutrophils Relative % 67 %   Neutro Abs 6.3 1.7 - 7.7 K/uL   Lymphocytes Relative 26 %   Lymphs Abs 2.4 0.7 - 4.0 K/uL   Monocytes Relative 6 %   Monocytes Absolute 0.5 0.1 - 1.0 K/uL   Eosinophils Relative 1 %   Eosinophils Absolute 0.1 0.0 - 0.7 K/uL   Basophils Relative 0 %   Basophils Absolute 0.0 0.0 - 0.1 K/uL    Current Facility-Administered Medications  Medication Dose Route Frequency Provider Last Rate Last Dose  . acetaminophen (TYLENOL) tablet 650 mg  650 mg Oral Q4H PRN [name], MD      . albuterol (PROVENTIL HFA;VENTOLIN HFA) 108 (90 BASE) MCG/ACT inhaler 2 puff  2 puff Inhalation Q4H PRN [name], MD      . alum &  mag hydroxide-simeth (MAALOX/MYLANTA) 200-200-20 MG/5ML suspension 30 mL  30 mL Oral PRN [name], MD      . atorvastatin (LIPITOR) tablet 40 mg  40 mg Oral q1800 [name], MD   40 mg at 03/26/15 1815  . cloZAPine (CLOZARIL) tablet 100 mg  100 mg Oral BID [name], MD   100 mg at 03/27/15 1023  . divalproex (DEPAKOTE) DR  tablet 1,000 mg  1,000 mg Oral QHS [name], MD   1,000 mg at 03/26/15 2200  . divalproex (DEPAKOTE) DR tablet 250 mg  250 mg Oral QHS [name], MD   250 mg at 03/26/15 2202  . famotidine (PEPCID) tablet 20 mg  20 mg Oral BID [name], MD   20 mg at 03/27/15 1024  . ibuprofen (ADVIL,MOTRIN) tablet 600 mg  600 mg Oral Q8H PRN [name], MD      . insulin glargine (LANTUS) injection 10 Units  10 Units Subcutaneous QHS Charm Rings, NP   10 Units at 03/26/15 2204  . linagliptin (TRADJENTA) tablet 5 mg  5 mg Oral Daily [name], MD   5 mg at 03/27/15 1024  . LORazepam (ATIVAN) tablet 0.5 mg  0.5 mg Oral BID [name], MD   0.5 mg at 03/27/15 1024  . magnesium oxide (MAG-OX) tablet 400 mg  400 mg Oral BID [name], MD   400 mg at 03/27/15 1024  . metFORMIN (GLUCOPHAGE) tablet 1,000 mg  1,000 mg Oral BID WC [name], MD   1,000 mg at 03/27/15 0847  . metoprolol tartrate (LOPRESSOR) tablet 25 mg  25 mg Oral BID [name], MD   25 mg at 03/27/15 1023  . nicotine (NICODERM CQ - dosed in mg/24 hours) patch 21 mg  21 mg Transdermal Daily PRN [name], MD      . ondansetron [facility]) tablet 4 mg  4 mg Oral Q8H PRN [name], MD      . QUEtiapine (SEROQUEL) tablet 100 mg  100 mg Oral QHS Charm Rings, NP   100 mg at 03/26/15 2201  . QUEtiapine (SEROQUEL) tablet 50 mg  50 mg Oral Daily Charm Rings, NP   50 mg at 03/27/15 0847  . tiotropium (SPIRIVA) inhalation capsule 18 mcg  18 mcg Inhalation Daily [name], MD   18 mcg at 03/27/15 0847  . verapamil (CALAN) tablet 120 mg  120 mg Oral QHS [name], MD   120 mg at  03/26/15 2207  . zolpidem (AMBIEN) tablet 5 mg  5 mg Oral QHS PRN [name], MD   5 mg at 03/21/15 2145   Current Outpatient Prescriptions  Medication Sig Dispense Refill  . albuterol (PROVENTIL HFA;VENTOLIN HFA) 108 (90 BASE) MCG/ACT inhaler Inhale 2 puffs into the lungs every 4 (four) hours as needed for shortness of breath.    Marland Kitchen atorvastatin (LIPITOR) 40 MG tablet Take 40 mg by mouth daily with breakfast.    . cloZAPine (CLOZARIL) 100 MG tablet Take 1 tablet (100 mg total) by mouth 2 (two) times daily. 60 tablet 0  . divalproex (DEPAKOTE) 250 MG DR tablet Take 250 mg by mouth at bedtime.    . divalproex (DEPAKOTE) 500 MG DR tablet Take 1,000 mg by mouth at bedtime.    . hydrochlorothiazide (MICROZIDE) 12.5 MG capsule Take 12.5 mg by mouth daily.    . insulin glargine (LANTUS) 100 UNIT/ML injection Inject 10 Units into the skin at bedtime.    Marland Kitchen lisinopril (PRINIVIL,ZESTRIL) 10 MG tablet Take 20 mg by mouth daily.     Marland Kitchen LORazepam (ATIVAN) 0.5 MG tablet Take 0.5 mg by mouth 2 (two) times daily.    . magnesium oxide (MAG-OX) 400 MG tablet Take 400 mg by mouth 2 (two) times daily.    . metFORMIN (GLUCOPHAGE) 1000 MG tablet Take 1,000 mg by mouth 2 (two) times daily with a meal.    .  metoprolol tartrate (LOPRESSOR) 25 MG tablet Take 25 mg by mouth 2 (two) times daily.    . QUEtiapine (SEROQUEL) 100 MG tablet Take 100 mg by mouth 2 (two) times daily.    . QUEtiapine (SEROQUEL) 50 MG tablet Take 1 tablet (50 mg total) by mouth 2 (two) times daily. (Patient taking differently: Take by mouth 2 (two) times daily. ) 60 tablet 0  . ranitidine (ZANTAC) 150 MG tablet Take 150 mg by mouth daily with breakfast.    . senna-docusate (SENEXON-S) 8.6-50 MG per tablet Take 1 tablet by mouth 2 (two) times daily.    . sitaGLIPtin (JANUVIA) 100 MG tablet Take 100 mg by mouth daily with breakfast.    . tiotropium (SPIRIVA) 18 MCG inhalation capsule Place 18 mcg into inhaler and inhale daily.    . verapamil  (CALAN) 120 MG tablet Take 120 mg by mouth at bedtime.      Musculoskeletal: Strength & Muscle Tone: within normal limits Gait & Station: normal Patient leans: N/A  Psychiatric Specialty Exam: Review of Systems  Constitutional: Negative.   HENT: Negative.   Eyes: Negative.   Respiratory: Negative.   Cardiovascular: Negative.   Gastrointestinal: Negative.   Genitourinary: Negative.   Musculoskeletal: Negative.   Skin: Negative.   Neurological: Negative.   Endo/Heme/Allergies: Negative.   Psychiatric/Behavioral:       Denies     Blood pressure 154/85, pulse 93, temperature 98.6 F (37 C), temperature source Oral, resp. rate 17, SpO2 99 %.There is no weight on file to calculate BMI.  General Appearance: Casual  Eye Contact::  Good  Speech:  Normal Rate  Volume:  Normal  Mood:  Euthymic  Affect:  Flat  Thought Process:  Coherent  Orientation:  Full (Time, Place, and Person)  Thought Content:  WDL  Suicidal Thoughts:  No  Homicidal Thoughts:  No  Memory:  Immediate;   Fair Recent;   Fair Remote;   Fair  Judgement:  Fair  Insight:  Fair  Psychomotor Activity:  Normal  Concentration:  Good  Recall:  Fair  Fund of Knowledge:Fair  Language: Good  Akathisia:  No  Handed:  Right  AIMS (if indicated):     Assets:  Housing Leisure Time Physical Health Resilience Social Support  ADL's:  Intact  Cognition: WNL  Sleep:      Treatment Plan Summary: Daily contact with patient to assess and evaluate symptoms and progress in treatment, Medication management and Plan schizophrenia, paranoid  -Crisis stabilization -Medication management:  Home medications started as he had not been taking them--Clozaril 100 mg BID for psychosis/schizoprhenia, Depakote 1250 mg at bedtime for mood stabilization, Seroquel changed from 50 mg BID and 100 mg at bedtime to 50 mg daily and 100 mg at bedtime for sleep -Individual counseling -discharge with Rx  Disposition: No evidence of imminent  risk to self or others at present.    Nanine MeansLORD, JAMISON, PMH-NP 03/27/2015 11:11 AM Patient seen face-to-face for psychiatric evaluation, chart reviewed and case discussed with the physician extender and developed treatment plan. Reviewed the information documented and agree with the treatment plan. Thedore MinsMojeed Stefana Lodico, MD

## 2015-03-27 NOTE — ED Notes (Signed)
Patient discharged to home.  Left the unit ambulatory with all belongings.  [facility] worker was here to give him a ride home.  All prescriptions and discharge paperwork handed over to the worker.  Denies thoughts of harm to self or others.

## 2015-03-27 NOTE — Progress Notes (Signed)
CSW was notified by TTS disposition that patient is up for discharge.   CSW reached out to patient's case manager/Jasmine at [phone] to inquire if she would be able to pick up pt.  She informed CSW that she is not able to come and get patient. However, she gave contact information for Barrett ShellCora Stanely of [employer] at [phone] to ask if she could pick up the patient to take him back home. CSW spoke with Ivor Messierora, who states she can pick up the patient between 2:00 -2:30pm. CSW made nurse aware. CSW made NP aware.  Trish MageBrittney Nidya Bouyer, LCSWA [phone] ED CSW 03/27/2015 12:31 PM

## 2015-03-27 NOTE — ED Notes (Signed)
Patient denies SI, HI and AVH at this time. Patient is calm, cooperative and pleasant at this time. Plan of care discussed with patient and patient voices no complaints or concerns at this time. Encouragement and support provided and safety maintain. Q 15 min safety checks remain in place.

## 2015-04-21 ENCOUNTER — Emergency Department (INDEPENDENT_AMBULATORY_CARE_PROVIDER_SITE_OTHER): Payer: Medicaid Other

## 2015-04-21 ENCOUNTER — Emergency Department (INDEPENDENT_AMBULATORY_CARE_PROVIDER_SITE_OTHER)
Admission: EM | Admit: 2015-04-21 | Discharge: 2015-04-21 | Disposition: A | Discharge status: Home / Self Care | Payer: Medicaid Other | Source: Home / Self Care | Attending: Family Medicine | Admitting: Family Medicine

## 2015-04-21 ENCOUNTER — Encounter (HOSPITAL_COMMUNITY): Payer: Self-pay | Admitting: Emergency Medicine

## 2015-04-21 DIAGNOSIS — J069 Acute upper respiratory infection, unspecified: Secondary | ICD-10-CM

## 2015-04-21 DIAGNOSIS — R0982 Postnasal drip: Secondary | ICD-10-CM

## 2015-04-21 DIAGNOSIS — J9801 Acute bronchospasm: Secondary | ICD-10-CM | POA: Diagnosis not present

## 2015-04-21 DIAGNOSIS — Z720 Tobacco use: Secondary | ICD-10-CM | POA: Diagnosis not present

## 2015-04-21 DIAGNOSIS — J410 Simple chronic bronchitis: Secondary | ICD-10-CM

## 2015-04-21 MED ORDER — ALBUTEROL SULFATE HFA 108 (90 BASE) MCG/ACT IN AERS: 2.0000 | INHALATION_SPRAY | RESPIRATORY_TRACT | Status: DC | PRN

## 2015-04-21 MED ORDER — IPRATROPIUM-ALBUTEROL 0.5-2.5 (3) MG/3ML IN SOLN
RESPIRATORY_TRACT | Status: AC
  Filled 2015-04-21: qty 3

## 2015-04-21 MED ORDER — IPRATROPIUM-ALBUTEROL 0.5-2.5 (3) MG/3ML IN SOLN
3.0000 mL | Freq: Once | RESPIRATORY_TRACT | Status: AC
  Administered 2015-04-21: 3 mL via RESPIRATORY_TRACT

## 2015-04-21 MED ORDER — CETIRIZINE HCL 10 MG PO CHEW: 10.0000 mg | CHEWABLE_TABLET | Freq: Every day | ORAL | Status: DC

## 2015-04-21 MED ORDER — PREDNISONE 20 MG PO TABS: ORAL_TABLET | ORAL | Status: DC

## 2015-04-21 NOTE — Discharge Instructions (Signed)
Bronchospasm, Adult A bronchospasm is a spasm or tightening of the airways going into the lungs. During a bronchospasm breathing becomes more difficult because the airways get smaller. When this happens there can be coughing, a whistling sound when breathing (wheezing), and difficulty breathing. Bronchospasm is often associated with asthma, but not all patients who experience a bronchospasm have asthma. CAUSES  A bronchospasm is caused by inflammation or irritation of the airways. The inflammation or irritation may be triggered by:   Allergies (such as to animals, pollen, food, or mold). Allergens that cause bronchospasm may cause wheezing immediately after exposure or many hours later.   Infection. Viral infections are believed to be the most common cause of bronchospasm.   Exercise.   Irritants (such as pollution, cigarette smoke, strong odors, aerosol sprays, and paint fumes).   Weather changes. Winds increase molds and pollens in the air. Rain refreshes the air by washing irritants out. Cold air may cause inflammation.   Stress and emotional upset.  SIGNS AND SYMPTOMS   Wheezing.   Excessive nighttime coughing.   Frequent or severe coughing with a simple cold.   Chest tightness.   Shortness of breath.  DIAGNOSIS  Bronchospasm is usually diagnosed through a history and physical exam. Tests, such as chest X-rays, are sometimes done to look for other conditions. TREATMENT   Inhaled medicines can be given to open up your airways and help you breathe. The medicines can be given using either an inhaler or a nebulizer machine.  Corticosteroid medicines may be given for severe bronchospasm, usually when it is associated with asthma. HOME CARE INSTRUCTIONS   Always have a plan prepared for seeking medical care. Know when to call your health care provider and local emergency services (911 in the [place] Know where you can access local emergency care.  Only take medicines as  directed by your health care provider.  If you were prescribed an inhaler or nebulizer machine, ask your health care provider to explain how to use it correctly. Always use a spacer with your inhaler if you were given one.  It is necessary to remain calm during an attack. Try to relax and breathe more slowly.  Control your home environment in the following ways:   Change your heating and air conditioning filter at least once a month.   Limit your use of fireplaces and wood stoves.  Do not smoke and do not allow smoking in your home.   Avoid exposure to perfumes and fragrances.   Get rid of pests (such as roaches and mice) and their droppings.   Throw away plants if you see mold on them.   Keep your house clean and dust free.   Replace carpet with wood, tile, or vinyl flooring. Carpet can trap dander and dust.   Use allergy-proof pillows, mattress covers, and box spring covers.   Wash bed sheets and blankets every week in hot water and dry them in a dryer.   Use blankets that are made of polyester or cotton.   Wash hands frequently. SEEK MEDICAL CARE IF:   You have muscle aches.   You have chest pain.   The sputum changes from clear or white to yellow, green, gray, or bloody.   The sputum you cough up gets thicker.   There are problems that may be related to the medicine you are given, such as a rash, itching, swelling, or trouble breathing.  SEEK IMMEDIATE MEDICAL CARE IF:   You have worsening wheezing and coughing  even after taking your prescribed medicines.   You have increased difficulty breathing.   You develop severe chest pain. MAKE SURE YOU:   Understand these instructions.  Will watch your condition.  Will get help right away if you are not doing well or get worse.   This information is not intended to replace advice given to you by your health care provider. Make sure you discuss any questions you have with your health care  provider.   Document Released: 05/13/2003 Document Revised: 05/31/2014 Document Reviewed: 10/30/2012 Elsevier Interactive Patient Education 2016 Elsevier Inc.  Chronic Bronchitis Chronic bronchitis is a lasting inflammation of the bronchial tubes, which are the tubes that carry air into your lungs. This is inflammation that occurs:   On most days of the week.   For at least three months at a time.   Over a period of two years in a row. When the bronchial tubes are inflamed, they start to produce mucus. The inflammation and buildup of mucus make it more difficult to breathe. Chronic bronchitis is usually a permanent problem and is one type of chronic obstructive pulmonary disease (COPD). People with chronic bronchitis are at greater risk for getting repeated colds, or respiratory infections. CAUSES  Chronic bronchitis most often occurs in people who have:  Long-standing, severe asthma.  A history of smoking.  Asthma and who also smoke. SIGNS AND SYMPTOMS  Chronic bronchitis may cause the following:   A cough that brings up mucus (productive cough).  Shortness of breath.  Early morning headache.  Wheezing.  Chest discomfort.   Recurring respiratory infections. DIAGNOSIS  Your health care provider may confirm the diagnosis by:  Taking your medical history.  Performing a physical exam.  Taking a chest X-ray.   Performing pulmonary function tests. TREATMENT  Treatment involves controlling symptoms with medicines, oxygen therapy, or making lifestyle changes, such as exercising and eating a healthy, well-balanced diet. Medicines could include:  Inhalers to improve air flow in and out of your lungs.  Antibiotics to treat bacterial infections, such as pneumonia, sinus infections, and acute bronchitis. As a preventative measure, your health care provider may recommend routine vaccinations for influenza and pneumonia. This is to prevent infection and hospitalization  since you may be more at risk for these types of infections.  HOME CARE INSTRUCTIONS  Take medicines only as directed by your health care provider.   If you smoke cigarettes, chew tobacco, or use electronic cigarettes, quit. If you need help quitting, ask your health care provider.  Avoid pollen, dust, animal dander, molds, smoke, and other things that cause shortness of breath or wheezing attacks.  Talk to your health care provider about possible exercise routines. Regular exercise is very important to help you feel better.  If you are prescribed oxygen use at home follow these guidelines:  Never smoke while using oxygen. Oxygen does not burn or explode, but flammable materials will burn faster in the presence of oxygen.  Keep a Data processing manager close by. Let your fire department know that you have oxygen in your home.  Warn visitors not to smoke near you when you are using oxygen. Put up "no smoking" signs in your home where you most often use the oxygen.  Regularly test your smoke detectors at home to make sure they work. If you receive care in your home from a nurse or other health care provider, he or she may also check to make sure your smoke detectors work.  Ask your health care  provider whether you would benefit from a pulmonary rehabilitation program.  Do not wait to get medical care if you have any concerning symptoms. Delays could cause permanent injury and may be life threatening. SEEK MEDICAL CARE IF:  You have increased coughing or shortness of breath or both.  You have muscle aches.  You have chest pain.  Your mucus gets thicker.  Your mucus changes from clear or white to yellow, green, gray, or bloody. SEEK IMMEDIATE MEDICAL CARE IF:  Your usual medicines do not stop your wheezing.   You have increased difficulty breathing.   You have any problems with the medicine you are taking, such as a rash, itching, swelling, or trouble breathing. MAKE SURE YOU:     Understand these instructions.  Will watch your condition.  Will get help right away if you are not doing well or get worse.   This information is not intended to replace advice given to you by your health care provider. Make sure you discuss any questions you have with your health care provider.   Document Released: 02/25/2006 Document Revised: 05/31/2014 Document Reviewed: 06/18/2013 Elsevier Interactive Patient Education 2016 Elsevier Inc.  Cough, Adult Coughing is a reflex that clears your throat and your airways. Coughing helps to heal and protect your lungs. It is normal to cough occasionally, but a cough that happens with other symptoms or lasts a long time may be a sign of a condition that needs treatment. A cough may last only 2-3 weeks (acute), or it may last longer than 8 weeks (chronic). CAUSES Coughing is commonly caused by:  Breathing in substances that irritate your lungs.  A viral or bacterial respiratory infection.  Allergies.  Asthma.  Postnasal drip.  Smoking.  Acid backing up from the stomach into the esophagus (gastroesophageal reflux).  Certain medicines.  Chronic lung problems, including COPD (or rarely, lung cancer).  Other medical conditions such as heart failure. HOME CARE INSTRUCTIONS  Pay attention to any changes in your symptoms. Take these actions to help with your discomfort:  Take medicines only as told by your health care provider.  If you were prescribed an antibiotic medicine, take it as told by your health care provider. Do not stop taking the antibiotic even if you start to feel better.  Talk with your health care provider before you take a cough suppressant medicine.  Drink enough fluid to keep your urine clear or pale yellow.  If the air is dry, use a cold steam vaporizer or humidifier in your bedroom or your home to help loosen secretions.  Avoid anything that causes you to cough at work or at home.  If your cough is worse  at night, try sleeping in a semi-upright position.  Avoid cigarette smoke. If you smoke, quit smoking. If you need help quitting, ask your health care provider.  Avoid caffeine.  Avoid alcohol.  Rest as needed. SEEK MEDICAL CARE IF:   You have new symptoms.  You cough up pus.  Your cough does not get better after 2-3 weeks, or your cough gets worse.  You cannot control your cough with suppressant medicines and you are losing sleep.  You develop pain that is getting worse or pain that is not controlled with pain medicines.  You have a fever.  You have unexplained weight loss.  You have night sweats. SEEK IMMEDIATE MEDICAL CARE IF:  You cough up blood.  You have difficulty breathing.  Your heartbeat is very fast.   This information is  not intended to replace advice given to you by your health care provider. Make sure you discuss any questions you have with your health care provider.   Document Released: 11/06/2010 Document Revised: 01/29/2015 Document Reviewed: 07/17/2014 Elsevier Interactive Patient Education 2016 [employer].  How to Use an Inhaler Using your inhaler correctly is very important. Good technique will make sure that the medicine reaches your lungs.  HOW TO USE AN INHALER:  Take the cap off the inhaler.  If this is the first time using your inhaler, you need to prime it. Shake the inhaler for 5 seconds. Release four puffs into the air, away from your face. Ask your doctor for help if you have questions.  Shake the inhaler for 5 seconds.  Turn the inhaler so the bottle is above the mouthpiece.  Put your pointer finger on top of the bottle. Your thumb holds the bottom of the inhaler.  Open your mouth.  Either hold the inhaler away from your mouth (the width of 2 fingers) or place your lips tightly around the mouthpiece. Ask your doctor which way to use your inhaler.  Breathe out as much air as possible.  Breathe in and push down on the bottle 1  time to release the medicine. You will feel the medicine go in your mouth and throat.  Continue to take a deep breath in very slowly. Try to fill your lungs.  After you have breathed in completely, hold your breath for 10 seconds. This will help the medicine to settle in your lungs. If you cannot hold your breath for 10 seconds, hold it for as long as you can before you breathe out.  Breathe out slowly, through pursed lips. Whistling is an example of pursed lips.  If your doctor has told you to take more than 1 puff, wait at least 15-30 seconds between puffs. This will help you get the best results from your medicine. Do not use the inhaler more than your doctor tells you to.  Put the cap back on the inhaler.  Follow the directions from your doctor or from the inhaler package about cleaning the inhaler. If you use more than one inhaler, ask your doctor which inhalers to use and what order to use them in. Ask your doctor to help you figure out when you will need to refill your inhaler.  If you use a steroid inhaler, always rinse your mouth with water after your last puff, gargle and spit out the water. Do not swallow the water. GET HELP IF:  The inhaler medicine only partially helps to stop wheezing or shortness of breath.  You are having trouble using your inhaler.  You have some increase in thick spit (phlegm). GET HELP RIGHT AWAY IF:  The inhaler medicine does not help your wheezing or shortness of breath or you have tightness in your chest.  You have dizziness, headaches, or fast heart rate.  You have chills, fever, or night sweats.  You have a large increase of thick spit, or your thick spit is bloody. MAKE SURE YOU:   Understand these instructions.  Will watch your condition.  Will get help right away if you are not doing well or get worse.   This information is not intended to replace advice given to you by your health care provider. Make sure you discuss any questions  you have with your health care provider.   Document Released: 02/17/2008 Document Revised: 02/28/2013 Document Reviewed: 12/07/2012 Elsevier Interactive Patient Education 2016 Elsevier  Inc.  Upper Respiratory Infection, Adult Most upper respiratory infections (URIs) are caused by a virus. A URI affects the nose, throat, and upper air passages. The most common type of URI is often called "the common cold." HOME CARE   Take medicines only as told by your doctor.  Gargle warm saltwater or take cough drops to comfort your throat as told by your doctor.  Use a warm mist humidifier or inhale steam from a shower to increase air moisture. This may make it easier to breathe.  Drink enough fluid to keep your pee (urine) clear or pale yellow.  Eat soups and other clear broths.  Have a healthy diet.  Rest as needed.  Go back to work when your fever is gone or your doctor says it is okay.  You may need to stay home longer to avoid giving your URI to others.  You can also wear a face mask and wash your hands often to prevent spread of the virus.  Use your inhaler more if you have asthma.  Do not use any tobacco products, including cigarettes, chewing tobacco, or electronic cigarettes. If you need help quitting, ask your doctor. GET HELP IF:  You are getting worse, not better.  Your symptoms are not helped by medicine.  You have chills.  You are getting more short of breath.  You have brown or red mucus.  You have yellow or brown discharge from your nose.  You have pain in your face, especially when you bend forward.  You have a fever.  You have puffy (swollen) neck glands.  You have pain while swallowing.  You have white areas in the back of your throat. GET HELP RIGHT AWAY IF:   You have very bad or constant:  Headache.  Ear pain.  Pain in your forehead, behind your eyes, and over your cheekbones (sinus pain).  Chest pain.  You have long-lasting (chronic) lung  disease and any of the following:  Wheezing.  Long-lasting cough.  Coughing up blood.  A change in your usual mucus.  You have a stiff neck.  You have changes in your:  Vision.  Hearing.  Thinking.  Mood. MAKE SURE YOU:   Understand these instructions.  Will watch your condition.  Will get help right away if you are not doing well or get worse.   This information is not intended to replace advice given to you by your health care provider. Make sure you discuss any questions you have with your health care provider.   Document Released: 10/27/2007 Document Revised: 09/24/2014 Document Reviewed: 08/15/2013 Elsevier Interactive Patient Education 2016 Elsevier Inc.  Viral Infections A virus is a type of germ. Viruses can cause:  Minor sore throats.  Aches and pains.  Headaches.  Runny nose.  Rashes.  Watery eyes.  Tiredness.  Coughs.  Loss of appetite.  Feeling sick to your stomach (nausea).  Throwing up (vomiting).  Watery poop (diarrhea). HOME CARE   Only take medicines as told by your doctor.  Drink enough water and fluids to keep your pee (urine) clear or pale yellow. Sports drinks are a good choice.  Get plenty of rest and eat healthy. Soups and broths with crackers or rice are fine. GET HELP RIGHT AWAY IF:   You have a very bad headache.  You have shortness of breath.  You have chest pain or neck pain.  You have an unusual rash.  You cannot stop throwing up.  You have watery poop that does  not stop.  You cannot keep fluids down.  You or your child has a temperature by mouth above 102 F (38.9 C), not controlled by medicine.  Your baby is older than 3 months with a rectal temperature of 102 F (38.9 C) or higher.  Your baby is 653 months old or younger with a rectal temperature of 100.4 F (38 C) or higher. MAKE SURE YOU:   Understand these instructions.  Will watch this condition.  Will get help right away if you are not  doing well or get worse.   This information is not intended to replace advice given to you by your health care provider. Make sure you discuss any questions you have with your health care provider.   Document Released: 04/22/2008 Document Revised: 08/02/2011 Document Reviewed: 10/16/2014 Elsevier Interactive Patient Education [employer]2016 Elsevier Inc.

## 2015-04-21 NOTE — ED Provider Notes (Signed)
CSN: [id number]     Arrival date & time 04/21/15  1735 History   First MD Initiated Contact with Patient 04/21/15 1803     Chief Complaint  Patient presents with  . URI   (Consider location/radiation/quality/duration/timing/severity/associated sxs/prior Treatment) HPI Comments: 54 year old psychiatric male patient who lives in a group home type setting is brought in by the caseworker stating that he has had fevers, runny nose, chest congestion and a cough with shortness of breath for nearly a week. Denies sore throat or earache. He is unable to state whether he has PND or not. He is on multiple psych atrophic medications. He also has type 2 diabetes mellitus with insulin as part of his treatment. He is a smoker and uses albuterol HFA when necessary.   Past Medical History  Diagnosis Date  . Diabetes mellitus without complication (HCC)   . Hyperlipidemia   . Hypertension   . GERD (gastroesophageal reflux disease)   . Uric acid nephrolithiasis   . Sleep apnea   . Paranoid schizophrenia (HCC)    History reviewed. No pertinent past surgical history. No family history on file. Social History  Substance Use Topics  . Smoking status: Current Every Day Smoker -- 1.00 packs/day    Types: Cigarettes  . Smokeless tobacco: None  . Alcohol Use: No    Review of Systems  Constitutional: Positive for fever and activity change.       Due to patient's poor fund of knowledge and psychiatric condition he is a poor historian and unable to respond accurately to address subjective symptoms.  HENT: Negative for sore throat.   Respiratory: Positive for cough and shortness of breath.   Cardiovascular: Negative for chest pain.  Gastrointestinal: Negative.     Allergies  Cogentin; Penicillins; and Prolixin  Home Medications   Prior to Admission medications   Medication Sig Start Date End Date Taking? Authorizing Provider  atorvastatin (LIPITOR) 40 MG tablet Take 40 mg by mouth daily with  breakfast.   Yes Historical Provider, MD  cloZAPine (CLOZARIL) 100 MG tablet Take 1 tablet (100 mg total) by mouth 2 (two) times daily. 03/27/15  Yes Charm Rings, NP  divalproex (DEPAKOTE) 250 MG DR tablet Take 1 tablet (250 mg total) by mouth at bedtime. 03/27/15  Yes Charm Rings, NP  hydrochlorothiazide (MICROZIDE) 12.5 MG capsule Take 12.5 mg by mouth daily.   Yes Historical Provider, MD  lisinopril (PRINIVIL,ZESTRIL) 10 MG tablet Take 20 mg by mouth daily.    Yes Historical Provider, MD  LORazepam (ATIVAN) 0.5 MG tablet Take 1 tablet (0.5 mg total) by mouth 2 (two) times daily. 03/27/15  Yes Charm Rings, NP  magnesium oxide (MAG-OX) 400 MG tablet Take 400 mg by mouth 2 (two) times daily.   Yes Historical Provider, MD  metFORMIN (GLUCOPHAGE) 1000 MG tablet Take 1,000 mg by mouth 2 (two) times daily with a meal.   Yes Historical Provider, MD  QUEtiapine (SEROQUEL) 100 MG tablet Take 1 tablet (100 mg total) by mouth at bedtime. 03/27/15  Yes Charm Rings, NP  zolpidem (AMBIEN) 5 MG tablet Take 1 tablet (5 mg total) by mouth at bedtime as needed for sleep. 03/27/15  Yes Charm Rings, NP  albuterol (PROVENTIL HFA;VENTOLIN HFA) 108 (90 BASE) MCG/ACT inhaler Inhale 2 puffs into the lungs every 4 (four) hours as needed for wheezing or shortness of breath. 04/21/15   Hayden Rasmussen, NP  cetirizine (ZYRTEC) 10 MG chewable tablet Chew 1 tablet (10 mg total)  by mouth daily. 04/21/15   Hayden Rasmussenavid Daveah Varone, NP  divalproex (DEPAKOTE) 500 MG DR tablet Take 2 tablets (1,000 mg total) by mouth at bedtime. 03/27/15   Charm RingsJamison Y Lord, NP  insulin glargine (LANTUS) 100 UNIT/ML injection Inject 10 Units into the skin at bedtime.    Historical Provider, MD  metoprolol tartrate (LOPRESSOR) 25 MG tablet Take 25 mg by mouth 2 (two) times daily.    Historical Provider, MD  predniSONE (DELTASONE) 20 MG tablet Take one tablet daily for 5 days. Take with food. 04/21/15   Hayden Rasmussenavid Kerilyn Cortner, NP  QUEtiapine (SEROQUEL) 50 MG tablet Take 1  tablet (50 mg total) by mouth daily. 03/27/15   Charm RingsJamison Y Lord, NP  ranitidine (ZANTAC) 150 MG tablet Take 150 mg by mouth daily with breakfast.    Historical Provider, MD  senna-docusate (SENEXON-S) 8.6-50 MG per tablet Take 1 tablet by mouth 2 (two) times daily.    Historical Provider, MD  sitaGLIPtin (JANUVIA) 100 MG tablet Take 100 mg by mouth daily with breakfast.    Historical Provider, MD  tiotropium (SPIRIVA) 18 MCG inhalation capsule Place 18 mcg into inhaler and inhale daily.    Historical Provider, MD  verapamil (CALAN) 120 MG tablet Take 120 mg by mouth at bedtime.    Historical Provider, MD   Meds Ordered and Administered this Visit   Medications  ipratropium-albuterol (DUONEB) 0.5-2.5 (3) MG/3ML nebulizer solution 3 mL (3 mLs Nebulization Given 04/21/15 1857)    BP 153/83 mmHg  Pulse 114  Temp(Src) 100.2 F (37.9 C) (Oral)  Resp 16  SpO2 97% No data found.   Physical Exam  Constitutional: He appears well-nourished. No distress.  HENT:  Unable to visualize the oropharynx due to patient's tongue retraction and inability to open the airway sufficient for observation.  Eyes: EOM are normal.  Neck: Normal range of motion. Neck supple.  Cardiovascular: Normal rate, regular rhythm and normal heart sounds.   Pulmonary/Chest: Effort normal.  Bilateral coarseness and wheezes.  Lymphadenopathy:    He has no cervical adenopathy.  Neurological: He is alert. He exhibits normal muscle tone.  Skin: Skin is warm and dry.  Nursing note and vitals reviewed.   ED Course  Procedures (including critical care time)  Labs Review Labs Reviewed - No data to display  Imaging Review Dg Chest 2 View  04/21/2015  CLINICAL DATA:  COUGH, FEVER AND SOB X 10 DAYS; HX OF ASTHMA EXAM: CHEST  2 VIEW COMPARISON:  02/04/2015 FINDINGS: Lateral view degraded by patient arm position. Midline trachea. Borderline cardiomegaly. Mediastinal contours otherwise within normal limits. No pleural effusion  or pneumothorax. Diffuse peribronchial thickening. Clear lungs. IMPRESSION: No acute cardiopulmonary disease. Peribronchial thickening which may relate to chronic bronchitis, asthma or smoking. Electronically Signed   By: Jeronimo GreavesKyle  Talbot M.D.   On: 04/21/2015 18:57     Visual Acuity Review  Right Eye Distance:   Left Eye Distance:   Bilateral Distance:    Right Eye Near:   Left Eye Near:    Bilateral Near:         MDM   1. URI (upper respiratory infection)   2. PND (post-nasal drip)   3. Bronchospasm   4. Tobacco abuse disorder   5. Simple chronic bronchitis (HCC)    Albuterol HFA as dir Zyrtec 10 mg q d Prednisone 20 mg q d x 5d. Stop smoking    Hayden Rasmussenavid Kiing Deakin, NP 04/21/15 1920

## 2015-04-21 NOTE — ED Notes (Signed)
Here w/case manager, [place] Pt c/o cold sx onset x1 week Sx include fevers, runny nose, congestion, prod cough and SOB A&O x4... No acute distress.

## 2015-05-28 ENCOUNTER — Emergency Department (HOSPITAL_COMMUNITY): Payer: Medicaid Other

## 2015-05-28 ENCOUNTER — Encounter (HOSPITAL_COMMUNITY): Payer: Self-pay | Admitting: Nurse Practitioner

## 2015-05-28 ENCOUNTER — Emergency Department (HOSPITAL_COMMUNITY)
Admission: EM | Admit: 2015-05-28 | Discharge: 2015-05-28 | Disposition: A | Discharge status: Home / Self Care | Payer: Medicaid Other | Attending: Emergency Medicine | Admitting: Emergency Medicine

## 2015-05-28 DIAGNOSIS — Z7984 Long term (current) use of oral hypoglycemic drugs: Secondary | ICD-10-CM | POA: Diagnosis not present

## 2015-05-28 DIAGNOSIS — E785 Hyperlipidemia, unspecified: Secondary | ICD-10-CM | POA: Insufficient documentation

## 2015-05-28 DIAGNOSIS — Z79899 Other long term (current) drug therapy: Secondary | ICD-10-CM | POA: Diagnosis not present

## 2015-05-28 DIAGNOSIS — E119 Type 2 diabetes mellitus without complications: Secondary | ICD-10-CM | POA: Diagnosis not present

## 2015-05-28 DIAGNOSIS — R4182 Altered mental status, unspecified: Secondary | ICD-10-CM | POA: Insufficient documentation

## 2015-05-28 DIAGNOSIS — Z794 Long term (current) use of insulin: Secondary | ICD-10-CM | POA: Insufficient documentation

## 2015-05-28 DIAGNOSIS — G4730 Sleep apnea, unspecified: Secondary | ICD-10-CM | POA: Insufficient documentation

## 2015-05-28 DIAGNOSIS — K219 Gastro-esophageal reflux disease without esophagitis: Secondary | ICD-10-CM | POA: Insufficient documentation

## 2015-05-28 DIAGNOSIS — Z880 Allergy status to penicillin: Secondary | ICD-10-CM | POA: Insufficient documentation

## 2015-05-28 DIAGNOSIS — F200 Paranoid schizophrenia: Secondary | ICD-10-CM | POA: Insufficient documentation

## 2015-05-28 DIAGNOSIS — Z87442 Personal history of urinary calculi: Secondary | ICD-10-CM | POA: Diagnosis not present

## 2015-05-28 DIAGNOSIS — I10 Essential (primary) hypertension: Secondary | ICD-10-CM | POA: Diagnosis not present

## 2015-05-28 DIAGNOSIS — F1510 Other stimulant abuse, uncomplicated: Secondary | ICD-10-CM | POA: Diagnosis not present

## 2015-05-28 DIAGNOSIS — Z7952 Long term (current) use of systemic steroids: Secondary | ICD-10-CM | POA: Insufficient documentation

## 2015-05-28 DIAGNOSIS — F17210 Nicotine dependence, cigarettes, uncomplicated: Secondary | ICD-10-CM | POA: Diagnosis not present

## 2015-05-28 LAB — BLOOD GAS, ARTERIAL
ACID-BASE DEFICIT: 4.4 mmol/L — AB (ref 0.0–2.0)
BICARBONATE: 20.9 meq/L (ref 20.0–24.0)
Drawn by: 257701
O2 SAT: 73.8 %
PATIENT TEMPERATURE: 98.6
TCO2: 18.9 mmol/L (ref 0–100)
pCO2 arterial: 41.2 mmHg (ref 35.0–45.0)
pH, Arterial: 7.325 — ABNORMAL LOW (ref 7.350–7.450)
pO2, Arterial: 44.4 mmHg — ABNORMAL LOW (ref 80.0–100.0)

## 2015-05-28 LAB — RAPID URINE DRUG SCREEN, HOSP PERFORMED
Amphetamines: POSITIVE — AB
Barbiturates: NOT DETECTED
Benzodiazepines: NOT DETECTED
COCAINE: NOT DETECTED
OPIATES: NOT DETECTED
TETRAHYDROCANNABINOL: NOT DETECTED

## 2015-05-28 LAB — CBC
HCT: 43.1 % (ref 39.0–52.0)
HEMOGLOBIN: 13.7 g/dL (ref 13.0–17.0)
MCH: 26.1 pg (ref 26.0–34.0)
MCHC: 31.8 g/dL (ref 30.0–36.0)
MCV: 82.1 fL (ref 78.0–100.0)
Platelets: 339 10*3/uL (ref 150–400)
RBC: 5.25 MIL/uL (ref 4.22–5.81)
RDW: 16.7 % — ABNORMAL HIGH (ref 11.5–15.5)
WBC: 14.8 10*3/uL — ABNORMAL HIGH (ref 4.0–10.5)

## 2015-05-28 LAB — URINALYSIS, ROUTINE W REFLEX MICROSCOPIC
GLUCOSE, UA: NEGATIVE mg/dL
Hgb urine dipstick: NEGATIVE
KETONES UR: NEGATIVE mg/dL
LEUKOCYTES UA: NEGATIVE
Nitrite: NEGATIVE
PH: 5.5 (ref 5.0–8.0)
Protein, ur: 100 mg/dL — AB
SPECIFIC GRAVITY, URINE: 1.029 (ref 1.005–1.030)

## 2015-05-28 LAB — COMPREHENSIVE METABOLIC PANEL
ALT: 15 U/L — ABNORMAL LOW (ref 17–63)
ANION GAP: 11 (ref 5–15)
AST: 25 U/L (ref 15–41)
Albumin: 4.4 g/dL (ref 3.5–5.0)
Alkaline Phosphatase: 92 U/L (ref 38–126)
BUN: 35 mg/dL — ABNORMAL HIGH (ref 6–20)
CALCIUM: 9.4 mg/dL (ref 8.9–10.3)
CHLORIDE: 103 mmol/L (ref 101–111)
CO2: 23 mmol/L (ref 22–32)
Creatinine, Ser: 1.75 mg/dL — ABNORMAL HIGH (ref 0.61–1.24)
GFR calc non Af Amer: 42 mL/min — ABNORMAL LOW (ref >60)
GFR, EST AFRICAN AMERICAN: 49 mL/min — AB (ref >60)
Glucose, Bld: 159 mg/dL — ABNORMAL HIGH (ref 65–99)
Potassium: 4.6 mmol/L (ref 3.5–5.1)
SODIUM: 137 mmol/L (ref 135–145)
Total Bilirubin: 1.1 mg/dL (ref 0.3–1.2)
Total Protein: 8 g/dL (ref 6.5–8.1)

## 2015-05-28 LAB — URINE MICROSCOPIC-ADD ON: BACTERIA UA: NONE SEEN

## 2015-05-28 LAB — CBG MONITORING, ED: GLUCOSE-CAPILLARY: 139 mg/dL — AB (ref 65–99)

## 2015-05-28 LAB — ACETAMINOPHEN LEVEL

## 2015-05-28 LAB — ETHANOL: Alcohol, Ethyl (B): <5 mg/dL (ref <5)

## 2015-05-28 LAB — VALPROIC ACID LEVEL

## 2015-05-28 LAB — AMMONIA: Ammonia: 27 umol/L (ref 9–35)

## 2015-05-28 LAB — SALICYLATE LEVEL

## 2015-05-28 NOTE — ED Notes (Signed)
Bed: WA07 Expected date:  Expected time:  Means of arrival:  Comments: Ems- altered

## 2015-05-28 NOTE — ED Provider Notes (Addendum)
CSN: [id number]     Arrival date & time 05/28/15  1211 History   First MD Initiated Contact with Patient 05/28/15 1313     Chief Complaint  Patient presents with  . Altered Mental Status    HPI The patient presents today to the emergency department from the adult daycare center.  He has a history of paranoid schizophrenia.  Around 11:00 today the staff found him to be slightly confused. EMS found the patient lethargic snoring but answering simple questions when prompted.  At this time the patient is without complaints.  He states he feels sleepy.  He denies  Ingestion of additional medications today.  Denies alcohol and drug abuse.  He reports compliance with his medications including his Depakote.  He denies fever and headache.  It is required that he be awoken several times during the history and order to have the questions answered appropriately.   Past Medical History  Diagnosis Date  . Diabetes mellitus without complication (HCC)   . Hyperlipidemia   . Hypertension   . GERD (gastroesophageal reflux disease)   . Uric acid nephrolithiasis   . Sleep apnea   . Paranoid schizophrenia (HCC)    History reviewed. No pertinent past surgical history. History reviewed. No pertinent family history. Social History  Substance Use Topics  . Smoking status: Current Every Day Smoker -- 1.00 packs/day    Types: Cigarettes  . Smokeless tobacco: None  . Alcohol Use: No    Review of Systems  All other systems reviewed and are negative.     Allergies  Cogentin; Penicillins; and Prolixin  Home Medications   Prior to Admission medications   Medication Sig Start Date End Date Taking? Authorizing Provider  albuterol (PROVENTIL HFA;VENTOLIN HFA) 108 (90 BASE) MCG/ACT inhaler Inhale 2 puffs into the lungs every 4 (four) hours as needed for wheezing or shortness of breath. 04/21/15   Hayden Rasmussen, NP  atorvastatin (LIPITOR) 40 MG tablet Take 40 mg by mouth daily with breakfast.    Historical  Provider, MD  cetirizine (ZYRTEC) 10 MG chewable tablet Chew 1 tablet (10 mg total) by mouth daily. 04/21/15   Hayden Rasmussen, NP  cloZAPine (CLOZARIL) 100 MG tablet Take 1 tablet (100 mg total) by mouth 2 (two) times daily. 03/27/15   Charm Rings, NP  divalproex (DEPAKOTE) 250 MG DR tablet Take 1 tablet (250 mg total) by mouth at bedtime. 03/27/15   Charm Rings, NP  divalproex (DEPAKOTE) 500 MG DR tablet Take 2 tablets (1,000 mg total) by mouth at bedtime. 03/27/15   Charm Rings, NP  hydrochlorothiazide (MICROZIDE) 12.5 MG capsule Take 12.5 mg by mouth daily.    Historical Provider, MD  insulin glargine (LANTUS) 100 UNIT/ML injection Inject 10 Units into the skin at bedtime.    Historical Provider, MD  lisinopril (PRINIVIL,ZESTRIL) 10 MG tablet Take 20 mg by mouth daily.     Historical Provider, MD  LORazepam (ATIVAN) 0.5 MG tablet Take 1 tablet (0.5 mg total) by mouth 2 (two) times daily. 03/27/15   Charm Rings, NP  magnesium oxide (MAG-OX) 400 MG tablet Take 400 mg by mouth 2 (two) times daily.    Historical Provider, MD  metFORMIN (GLUCOPHAGE) 1000 MG tablet Take 1,000 mg by mouth 2 (two) times daily with a meal.    Historical Provider, MD  metoprolol tartrate (LOPRESSOR) 25 MG tablet Take 25 mg by mouth 2 (two) times daily.    Historical Provider, MD  predniSONE (DELTASONE) 20 MG  tablet Take one tablet daily for 5 days. Take with food. 04/21/15   Hayden Rasmussen, NP  QUEtiapine (SEROQUEL) 100 MG tablet Take 1 tablet (100 mg total) by mouth at bedtime. 03/27/15   Charm Rings, NP  QUEtiapine (SEROQUEL) 50 MG tablet Take 1 tablet (50 mg total) by mouth daily. 03/27/15   Charm Rings, NP  ranitidine (ZANTAC) 150 MG tablet Take 150 mg by mouth daily with breakfast.    Historical Provider, MD  senna-docusate (SENEXON-S) 8.6-50 MG per tablet Take 1 tablet by mouth 2 (two) times daily.    Historical Provider, MD  sitaGLIPtin (JANUVIA) 100 MG tablet Take 100 mg by mouth daily with breakfast.     Historical Provider, MD  tiotropium (SPIRIVA) 18 MCG inhalation capsule Place 18 mcg into inhaler and inhale daily.    Historical Provider, MD  verapamil (CALAN) 120 MG tablet Take 120 mg by mouth at bedtime.    Historical Provider, MD  zolpidem (AMBIEN) 5 MG tablet Take 1 tablet (5 mg total) by mouth at bedtime as needed for sleep. 03/27/15   Charm Rings, NP   BP 100/67 mmHg  Pulse 88  Temp(Src) 99 F (37.2 C) (Rectal)  Resp 15  SpO2 97% Physical Exam  Constitutional: He appears well-developed and well-nourished.  HENT:  Head: Normocephalic and atraumatic.  Eyes: EOM are normal. Pupils are equal, round, and reactive to light.  Neck: Normal range of motion.  Cardiovascular: Normal rate, regular rhythm, normal heart sounds and intact distal pulses.   Pulmonary/Chest: Effort normal and breath sounds normal. No respiratory distress.  Abdominal: Soft. He exhibits no distension. There is no tenderness.  Musculoskeletal: Normal range of motion.  Neurological:  5/5 strength in major muscle groups of  bilateral upper and lower extremities. No facial asymetry. Opens eyes to loud voice and painful stimuli.  Answers simple questions.  Follow simple commands  Skin: Skin is warm and dry.  Psychiatric: He has a normal mood and affect. Judgment normal.  Nursing note and vitals reviewed.   ED Course  Procedures (including critical care time) Labs Review Labs Reviewed  COMPREHENSIVE METABOLIC PANEL - Abnormal; Notable for the following:    Glucose, Bld 159 (*)    BUN 35 (*)    Creatinine, Ser 1.75 (*)    ALT 15 (*)    GFR calc non Af Amer 42 (*)    GFR calc Af Amer 49 (*)    All other components within normal limits  ACETAMINOPHEN LEVEL - Abnormal; Notable for the following:    Acetaminophen (Tylenol), Serum <10 (*)    All other components within normal limits  CBC - Abnormal; Notable for the following:    WBC 14.8 (*)    RDW 16.7 (*)    All other components within normal limits   URINE RAPID DRUG SCREEN, HOSP PERFORMED - Abnormal; Notable for the following:    Amphetamines POSITIVE (*)    All other components within normal limits  URINALYSIS, ROUTINE W REFLEX MICROSCOPIC (NOT AT [facility]) - Abnormal; Notable for the following:    Color, Urine AMBER (*)    APPearance CLOUDY (*)    Bilirubin Urine MODERATE (*)    Protein, ur 100 (*)    All other components within normal limits  URINE MICROSCOPIC-ADD ON - Abnormal; Notable for the following:    Squamous Epithelial / LPF 0-5 (*)    Casts HYALINE CASTS (*)    All other components within normal limits  CBG MONITORING,  ED - Abnormal; Notable for the following:    Glucose-Capillary 139 (*)    All other components within normal limits  ETHANOL  SALICYLATE LEVEL  CBG MONITORING, ED    Imaging Review Dg Chest 2 View  05/28/2015  CLINICAL DATA:  Altered mental status. EXAM: CHEST  2 VIEW COMPARISON:  04/21/2015 FINDINGS: The cardiac silhouette is upper limits of normal in size, unchanged. The patient has taken a shallower inspiration than on the prior study. Peribronchial thickening is similar to minimally more prominent than on the prior study. No confluent airspace opacity, overt edema, pleural effusion, or pneumothorax is identified. No acute osseous abnormality is seen. IMPRESSION: Chronic bronchitic changes. Electronically Signed   By: Sebastian AcheAllen  Grady M.D.   On: 05/28/2015 15:02   Ct Head Wo Contrast  05/28/2015  CLINICAL DATA:  Altered mental status. EXAM: CT HEAD WITHOUT CONTRAST TECHNIQUE: Contiguous axial images were obtained from the base of the skull through the vertex without contrast. COMPARISON:  02/06/2015 and 02/04/2015 FINDINGS: No evidence for acute hemorrhage, mass lesion, midline shift, hydrocephalus or large infarct. Visualized paranasal sinuses are clear. No calvarial fracture. IMPRESSION: No acute intracranial abnormality. Electronically Signed   By: Richarda OverlieAdam  Henn M.D.   On: 05/28/2015 15:33   I have personally  reviewed and evaluated these images and lab results as part of my medical decision-making.   EKG Interpretation None      MDM   Final diagnoses:  None    Patient is has significant improvement in his mental status while here in the emergency department.  His urine drug screen is positive for amphetamines.  He is not on any amphetamines at home.  I'm concerned that he may have been given the medication of a another patron of the Adult day care.  He is ambulatory in the hall.  He is without complaints.  Nurses staff will contact someone to come pick him up and verify that his mental status is returned to baseline.  If his mental status has returned to baseline he can be discharged home safely.  If he continues to be altered per family or friends he will need to be observed in the hospital.  Care given to oncoming provider.    Azalia BilisKevin Vassie Kugel, MD 05/28/15 1721  Azalia BilisKevin Melaney Tellefsen, MD 05/28/15 [phone]

## 2015-05-28 NOTE — ED Notes (Signed)
[facility] [phone]). She felt that patient had returned to baseline based on information given and that patient had likely taken too much of his medication which had resulted in his increased drowsiness today. She stated that he lives independently in his apartment and [facility]. Ms. Peterson A[place]prings did say that she is able to pick him up and transport him home.

## 2015-05-28 NOTE — ED Notes (Signed)
Pt came from EMS soiled. Cleaned pt, and placed in gown.

## 2015-05-28 NOTE — Discharge Instructions (Signed)
Confusion Confusion is the inability to think with your usual speed or clarity. Confusion may come on quickly or slowly over time. How quickly the confusion comes on depends on the cause. Confusion can be due to any number of causes. CAUSES   Concussion, head injury, or head trauma.  Seizures.  Stroke.  Fever.  Brain tumor.  Age related decreased brain function (dementia).  Heightened emotional states like rage or terror.  Mental illness in which the person loses the ability to determine what is real and what is not (hallucinations).  Infections such as a urinary tract infection (UTI).  Toxic effects from alcohol, drugs, or prescription medicines.  Dehydration and an imbalance of salts in the body (electrolytes).  Lack of sleep.  Low blood sugar (diabetes).  Low levels of oxygen from conditions such as chronic lung disorders.  Drug interactions or other medicine side effects.  Nutritional deficiencies, especially niacin, thiamine, vitamin C, or vitamin B.  Sudden drop in body temperature (hypothermia).  Change in routine, such as when traveling or hospitalized. SIGNS AND SYMPTOMS  People often describe their thinking as cloudy or unclear when they are confused. Confusion can also include feeling disoriented. That means you are unaware of where or who you are. You may also not know what the date or time is. If confused, you may also have difficulty paying attention, remembering, and making decisions. Some people also act aggressively when they are confused.  DIAGNOSIS  The medical evaluation of confusion may include:  Blood and urine tests.  X-rays.  Brain and nervous system tests.  Analyzing your brain waves (electroencephalogram or EEG).  Magnetic resonance imaging (MRI) of your head.  Computed tomography (CT) scan of your head.  Mental status tests in which your health care provider may ask many questions. Some of these questions may seem silly or strange,  but they are a very important test to help diagnose and treat confusion. TREATMENT  An admission to the hospital may not be needed, but a person with confusion should not be left alone. Stay with a family member or friend until the confusion clears. Avoid alcohol, pain relievers, or sedative drugs until you have fully recovered. Do not drive until directed by your health care provider. HOME CARE INSTRUCTIONS  What family and friends can do:  To find out if someone is confused, ask the person to state his or her name, age, and the date. If the person is unsure or answers incorrectly, he or she is confused.  Always introduce yourself, no matter how well the person knows you.  Often remind the person of his or her location.  Place a calendar and clock near the confused person.  Help the person with his or her medicines. You may want to use a pill box, an alarm as a reminder, or give the person each dose as prescribed.  Talk about current events and plans for the day.  Try to keep the environment calm, quiet, and peaceful.  Make sure the person keeps follow-up visits with his or her health care provider. PREVENTION  Ways to prevent confusion:  Avoid alcohol.  Eat a balanced diet.  Get enough sleep.  Take medicine only as directed by your health care provider.  Do not become isolated. Spend time with other people and make plans for your days.  Keep careful watch on your blood sugar levels if you are diabetic. SEEK IMMEDIATE MEDICAL CARE IF:   You develop severe headaches, repeated vomiting, seizures, blackouts, or   slurred speech.  There is increasing confusion, weakness, numbness, restlessness, or personality changes.  You develop a loss of balance, have marked dizziness, feel uncoordinated, or fall.  You have delusions, hallucinations, or develop severe anxiety.  Your family members think you need to be rechecked.   This information is not intended to replace advice given  to you by your health care provider. Make sure you discuss any questions you have with your health care provider.   Document Released: 06/17/2004 Document Revised: 05/31/2014 Document Reviewed: 06/15/2013 Elsevier Interactive Patient Education 2016 Elsevier Inc[employer]  

## 2015-05-28 NOTE — ED Notes (Signed)
Patient last seen normal at 11am today at [facility] where he attends day care. Patient arrives today via EMS lethargic, snoring, drooling, stooled himself, and wakens to touch.

## 2015-05-28 NOTE — ED Notes (Signed)
Trying to locate family or caregiver of the patient. Spoke with mother who was unaware of patient's living arrangements and stated that she was not able to assist in information regarding his baseline or transportation home. I was then able to contact the administrator with [facility], [place]. She did not have Sante's case worker's telephone number but said she would try to reach her and have her follow-up with us here. She did feel however that drowsiness was not baseline for Ronald Roman and that he lives independently in his apartment.

## 2016-02-15 ENCOUNTER — Encounter (HOSPITAL_COMMUNITY): Payer: Self-pay | Admitting: Emergency Medicine

## 2016-02-15 ENCOUNTER — Emergency Department (HOSPITAL_COMMUNITY)
Admission: EM | Admit: 2016-02-15 | Discharge: 2016-02-16 | Disposition: A | Discharge status: Other Acute Inpatient Hospital | Payer: Medicaid Other | Attending: Emergency Medicine | Admitting: Emergency Medicine

## 2016-02-15 DIAGNOSIS — I10 Essential (primary) hypertension: Secondary | ICD-10-CM | POA: Insufficient documentation

## 2016-02-15 DIAGNOSIS — Z794 Long term (current) use of insulin: Secondary | ICD-10-CM | POA: Diagnosis not present

## 2016-02-15 DIAGNOSIS — E119 Type 2 diabetes mellitus without complications: Secondary | ICD-10-CM | POA: Insufficient documentation

## 2016-02-15 DIAGNOSIS — F17210 Nicotine dependence, cigarettes, uncomplicated: Secondary | ICD-10-CM | POA: Diagnosis not present

## 2016-02-15 DIAGNOSIS — F200 Paranoid schizophrenia: Secondary | ICD-10-CM | POA: Diagnosis present

## 2016-02-15 DIAGNOSIS — F39 Unspecified mood [affective] disorder: Secondary | ICD-10-CM | POA: Diagnosis not present

## 2016-02-15 DIAGNOSIS — Z79899 Other long term (current) drug therapy: Secondary | ICD-10-CM | POA: Diagnosis not present

## 2016-02-15 DIAGNOSIS — R410 Disorientation, unspecified: Secondary | ICD-10-CM

## 2016-02-15 DIAGNOSIS — Z046 Encounter for general psychiatric examination, requested by authority: Secondary | ICD-10-CM | POA: Diagnosis present

## 2016-02-15 LAB — COMPREHENSIVE METABOLIC PANEL
ALBUMIN: 4.5 g/dL (ref 3.5–5.0)
ALT: 18 U/L (ref 17–63)
ANION GAP: 13 (ref 5–15)
AST: 24 U/L (ref 15–41)
Alkaline Phosphatase: 109 U/L (ref 38–126)
BILIRUBIN TOTAL: 0.7 mg/dL (ref 0.3–1.2)
BUN: 23 mg/dL — ABNORMAL HIGH (ref 6–20)
CO2: 21 mmol/L — ABNORMAL LOW (ref 22–32)
Calcium: 9.9 mg/dL (ref 8.9–10.3)
Chloride: 105 mmol/L (ref 101–111)
Creatinine, Ser: 1.2 mg/dL (ref 0.61–1.24)
GFR calc Af Amer: >60 mL/min (ref >60)
Glucose, Bld: 188 mg/dL — ABNORMAL HIGH (ref 65–99)
POTASSIUM: 3.4 mmol/L — AB (ref 3.5–5.1)
Sodium: 139 mmol/L (ref 135–145)
TOTAL PROTEIN: 8 g/dL (ref 6.5–8.1)

## 2016-02-15 LAB — CBC
HCT: 40.2 % (ref 39.0–52.0)
Hemoglobin: 13.3 g/dL (ref 13.0–17.0)
MCH: 27.5 pg (ref 26.0–34.0)
MCHC: 33.1 g/dL (ref 30.0–36.0)
MCV: 83.1 fL (ref 78.0–100.0)
PLATELETS: 379 10*3/uL (ref 150–400)
RBC: 4.84 MIL/uL (ref 4.22–5.81)
RDW: 13.8 % (ref 11.5–15.5)
WBC: 13.7 10*3/uL — AB (ref 4.0–10.5)

## 2016-02-15 LAB — RAPID URINE DRUG SCREEN, HOSP PERFORMED
Amphetamines: NOT DETECTED
BENZODIAZEPINES: NOT DETECTED
Barbiturates: NOT DETECTED
COCAINE: NOT DETECTED
Opiates: NOT DETECTED
Tetrahydrocannabinol: NOT DETECTED

## 2016-02-15 LAB — VALPROIC ACID LEVEL: VALPROIC ACID LVL: 12 ug/mL — AB (ref 50.0–100.0)

## 2016-02-15 LAB — ACETAMINOPHEN LEVEL

## 2016-02-15 LAB — ETHANOL

## 2016-02-15 LAB — SALICYLATE LEVEL

## 2016-02-15 MED ORDER — METOPROLOL TARTRATE 25 MG PO TABS
25.0000 mg | ORAL_TABLET | Freq: Two times a day (BID) | ORAL | Status: DC
  Administered 2016-02-16 (×2): 25 mg via ORAL
  Filled 2016-02-15 (×2): qty 1

## 2016-02-15 MED ORDER — DIVALPROEX SODIUM 500 MG PO DR TAB: 1000.0000 mg | DELAYED_RELEASE_TABLET | Freq: Every day | ORAL | Status: DC

## 2016-02-15 MED ORDER — LORAZEPAM 1 MG PO TABS
2.0000 mg | ORAL_TABLET | Freq: Once | ORAL | Status: AC
  Administered 2016-02-15: 2 mg via ORAL
  Filled 2016-02-15: qty 2

## 2016-02-15 MED ORDER — QUETIAPINE FUMARATE 100 MG PO TABS
100.0000 mg | ORAL_TABLET | Freq: Every day | ORAL | Status: DC
  Administered 2016-02-16: 100 mg via ORAL
  Filled 2016-02-15: qty 1

## 2016-02-15 MED ORDER — LISINOPRIL 10 MG PO TABS
10.0000 mg | ORAL_TABLET | Freq: Every day | ORAL | Status: DC
  Administered 2016-02-16: 10 mg via ORAL
  Filled 2016-02-15: qty 1

## 2016-02-15 MED ORDER — DIVALPROEX SODIUM 250 MG PO DR TAB: 250.0000 mg | DELAYED_RELEASE_TABLET | Freq: Every day | ORAL | Status: DC

## 2016-02-15 MED ORDER — QUETIAPINE FUMARATE 50 MG PO TABS
50.0000 mg | ORAL_TABLET | Freq: Every day | ORAL | Status: DC
  Administered 2016-02-16: 50 mg via ORAL
  Filled 2016-02-15: qty 1

## 2016-02-15 NOTE — ED Provider Notes (Signed)
[facility]-EMERGENCY DEPT Provider Note   CSN: [id number] Arrival date & time: 02/15/16  4098     History   Chief Complaint Chief Complaint  Patient presents with  . IVC    HPI Ronald Roman is a 55 y.o. male.  The history is provided by the police and the patient. No language interpreter was used.    Ronald Roman is a 55 y.o. male who presents to the Emergency Department complaining of IVC.  Level V caveat due to psychiatric disorder.  He presents under IVC stating that he is noncompliant with his medication.  Papers state that he is hallucinating and the police hold him down against his will and wont let him up.  He has been slamming doors and banging on the walls of his apartments.  On evaluation pt refuses most questioning.  Denies SI/HI, chest pain, sob, N/V/D.  He states he does not take medications or have health problems.    Past Medical History:  Diagnosis Date  . Diabetes mellitus without complication (HCC)   . GERD (gastroesophageal reflux disease)   . Hyperlipidemia   . Hypertension   . Paranoid schizophrenia (HCC)   . Sleep apnea   . Uric acid nephrolithiasis     Patient Active Problem List   Diagnosis Date Noted  . Acute psychosis   . Leukocytosis 02/06/2015  . Sleep apnea 02/05/2015  . AKI (acute kidney injury) (HCC) 02/04/2015  . Seizure disorder (HCC) 02/04/2015  . Acute encephalopathy 02/04/2015  . Diabetes mellitus without complication (HCC)   . Hyperlipidemia   . Hypertension   . Paranoid schizophrenia (HCC)     History reviewed. No pertinent surgical history.     Home Medications    Prior to Admission medications   Medication Sig Start Date End Date Taking? Authorizing Provider  albuterol (PROVENTIL HFA;VENTOLIN HFA) 108 (90 BASE) MCG/ACT inhaler Inhale 2 puffs into the lungs every 4 (four) hours as needed for wheezing or shortness of breath. 04/21/15   Hayden Rasmussen, NP  atorvastatin (LIPITOR) 40 MG tablet Take 40 mg by mouth daily with  breakfast.    Historical Provider, MD  cetirizine (ZYRTEC) 10 MG chewable tablet Chew 1 tablet (10 mg total) by mouth daily. 04/21/15   Hayden Rasmussen, NP  cloZAPine (CLOZARIL) 100 MG tablet Take 1 tablet (100 mg total) by mouth 2 (two) times daily. 03/27/15   Charm Rings, NP  divalproex (DEPAKOTE) 250 MG DR tablet Take 1 tablet (250 mg total) by mouth at bedtime. 03/27/15   Charm Rings, NP  divalproex (DEPAKOTE) 500 MG DR tablet Take 2 tablets (1,000 mg total) by mouth at bedtime. 03/27/15   Charm Rings, NP  hydrochlorothiazide (MICROZIDE) 12.5 MG capsule Take 12.5 mg by mouth daily.    Historical Provider, MD  insulin glargine (LANTUS) 100 UNIT/ML injection Inject 10 Units into the skin at bedtime.    Historical Provider, MD  lisinopril (PRINIVIL,ZESTRIL) 10 MG tablet Take 10 mg by mouth daily.     Historical Provider, MD  LORazepam (ATIVAN) 0.5 MG tablet Take 1 tablet (0.5 mg total) by mouth 2 (two) times daily. 03/27/15   Charm Rings, NP  magnesium oxide (MAG-OX) 400 MG tablet Take 400 mg by mouth 2 (two) times daily.    Historical Provider, MD  metFORMIN (GLUCOPHAGE) 1000 MG tablet Take 1,000 mg by mouth 2 (two) times daily with a meal.    Historical Provider, MD  metoprolol tartrate (LOPRESSOR) 25 MG tablet Take 25  mg by mouth 2 (two) times daily.    Historical Provider, MD  predniSONE (DELTASONE) 20 MG tablet Take one tablet daily for 5 days. Take with food. Patient not taking: Reported on 05/28/2015 04/21/15   Hayden Rasmussen, NP  QUEtiapine (SEROQUEL) 100 MG tablet Take 1 tablet (100 mg total) by mouth at bedtime. 03/27/15   Charm Rings, NP  QUEtiapine (SEROQUEL) 50 MG tablet Take 1 tablet (50 mg total) by mouth daily. 03/27/15   Charm Rings, NP  ranitidine (ZANTAC) 150 MG tablet Take 150 mg by mouth daily with breakfast.    Historical Provider, MD  senna-docusate (SENEXON-S) 8.6-50 MG per tablet Take 1 tablet by mouth 2 (two) times daily.    Historical Provider, MD  sitaGLIPtin (JANUVIA)  100 MG tablet Take 100 mg by mouth daily with breakfast.    Historical Provider, MD  tiotropium (SPIRIVA) 18 MCG inhalation capsule Place 18 mcg into inhaler and inhale daily.    Historical Provider, MD  verapamil (CALAN) 120 MG tablet Take 120 mg by mouth at bedtime.    Historical Provider, MD  zolpidem (AMBIEN) 5 MG tablet Take 1 tablet (5 mg total) by mouth at bedtime as needed for sleep. 03/27/15   Charm Rings, NP    Family History History reviewed. No pertinent family history.  Social History Social History  Substance Use Topics  . Smoking status: Current Every Day Smoker    Packs/day: 1.00    Types: Cigarettes  . Smokeless tobacco: Not on file  . Alcohol use No     Allergies   Cogentin [benztropine]; Penicillins; and Prolixin [fluphenazine]   Review of Systems Review of Systems  Unable to perform ROS: Psychiatric disorder     Physical Exam Updated Vital Signs BP (!) 159/119   Pulse 109 Comment: pt agitated.  Temp 97.6 F (36.4 C) (Oral)   Resp 16   SpO2 99%   Physical Exam  Constitutional: He is oriented to person, place, and time. He appears well-developed and well-nourished.  HENT:  Head: Normocephalic and atraumatic.  bruxism  Cardiovascular: Normal rate and regular rhythm.   Pulmonary/Chest: Effort normal. No respiratory distress.  Musculoskeletal: Normal range of motion.  Neurological: He is alert and oriented to person, place, and time.  Skin: Skin is warm.  Psychiatric:  Poor eye contact.  Agitated.    Nursing note and vitals reviewed.    ED Treatments / Results  Labs (all labs ordered are listed, but only abnormal results are displayed) Labs Reviewed  COMPREHENSIVE METABOLIC PANEL - Abnormal; Notable for the following:       Result Value   Potassium 3.4 (*)    CO2 21 (*)    Glucose, Bld 188 (*)    BUN 23 (*)    All other components within normal limits  ACETAMINOPHEN LEVEL - Abnormal; Notable for the following:    Acetaminophen  (Tylenol), Serum <10 (*)    All other components within normal limits  CBC - Abnormal; Notable for the following:    WBC 13.7 (*)    All other components within normal limits  VALPROIC ACID LEVEL - Abnormal; Notable for the following:    Valproic Acid Lvl 12 (*)    All other components within normal limits  ETHANOL  SALICYLATE LEVEL  URINE RAPID DRUG SCREEN, HOSP PERFORMED    EKG  EKG Interpretation None       Radiology No results found.  Procedures Procedures (including critical care time)  Medications Ordered  in ED Medications  LORazepam (ATIVAN) tablet 2 mg (2 mg Oral Given 02/15/16 2040)     Initial Impression / Assessment and Plan / ED Course  I have reviewed the triage vital signs and the nursing notes.  Pertinent labs & imaging results that were available during my care of the patient were reviewed by me and considered in my medical decision making (see chart for details).  Clinical Course    Patient with history of psychiatric disease here under IVC for agitated behavior. He is agitated and tangential in the emergency department. He has been medically cleared for psychiatric evaluation and treatment.  Final Clinical Impressions(s) / ED Diagnoses   Final diagnoses:  Mood disorder [facility](HCC)    New Prescriptions New Prescriptions   No medications on file     Tilden FossaElizabeth Fatin Bachicha, MD 02/15/16 2357

## 2016-02-15 NOTE — ED Triage Notes (Addendum)
Pt brought in by [employer] IVC'd in handcuffs. Pt IVC'd by "peer support." Per IVC papers pt has hx of schizophrenia, but he has not been taking his medications. Has had hallucinations that the police are holding him against his will and won't let him up (prior to [employer] arrival). Has been slamming doors, banging walls, and has been agitated. Pt denies SI/HI. Thinks people are stealing from him.

## 2016-02-15 NOTE — BH Assessment (Signed)
Assessment completed. Consulted Nira ConnJason Berry, NP who agrees that pt meets inpatient criteria. Informed Dr. Madilyn Hookees of the recommendation.

## 2016-02-15 NOTE — ED Notes (Signed)
Patient noted sleeping in room. No complaints, stable, in no acute distress. Q15 minute rounds and monitoring via Security Cameras to continue.  

## 2016-02-15 NOTE — BH Assessment (Signed)
Tele Assessment Note   Ronald Roman is an 55 y.o. male presenting to [facility] due to being petitioned by h is peer support specialist. 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 Pt also began singing "don't you know, I heard it before". Pt reported that he lives alone. Pt denies SI and HI at this time but when asked about hallucinations pt stated "social services want to pay my rent". Pt also shared that he smokes "the Lord's kind of cigarettes".  Collateral information was gathered from the petitioner who reported that when he arrived at pt's house he noted that pt's house was in disarray and there were feces from the kitchen to the bathroom and furniture was turned over. He also shared that pt is diagnosed with schizophrenia and he has been non-compliant with his medication for the past 2 months.  He reported that pt has been banging on walls and slamming doors, walking around and talking about his kids.  He also reported that when the police arrived pt informed the officers that he was unable to go with them because he had to stay with his kids and pointed to his stomach telling the officers that he was pregnant.  He reported that pt is receiving services through [address], [place] and [facility].  It has been determined that pt meets inpatient criteria.   Diagnosis: Schizophrenia   Past Medical History:  Past Medical History:  Diagnosis Date  . Diabetes mellitus without complication (HCC)   . GERD (gastroesophageal reflux disease)   . Hyperlipidemia   . Hypertension   . Paranoid schizophrenia (HCC)   . Sleep apnea   . Uric acid nephrolithiasis     History reviewed. No pertinent surgical history.  Family History: History reviewed. No pertinent family history.  Social History:  reports that he has been smoking Cigarettes.  He has been smoking about 1.00 pack per day. He does not have any smokeless tobacco history on file. He reports  that he does not drink alcohol or use drugs.  Additional Social History:  Alcohol / Drug Use History of alcohol / drug use?:  (unable to assess)  CIWA: CIWA-Ar BP: (!) 159/119 Pulse Rate: 109 (pt agitated.) COWS:    PATIENT STRENGTHS: (choose at least two) Average or above average intelligence  Allergies:  Allergies  Allergen Reactions  . Cogentin [Benztropine] Other (See Comments)    Per MAR   . Penicillins Other (See Comments)    Per MAR - unable to verify patients PCN reaction   . Prolixin [Fluphenazine] Other (See Comments)    Per MAR     Home Medications:  (Not in a hospital admission)  OB/GYN Status:  No LMP for male patient.  General Assessment Data [facility] Assessment: In system Is this a Tele or Face-to-Face Assessment?: Face-to-Face Is this an Initial Assessment or a Re-assessment for this encounter?: Initial Assessment Marital status: Single Living Arrangements: Alone Can pt return to current living arrangement?: Yes Admission Status: Involuntary Is patient capable of signing voluntary admission?: Yes Referral Source: Self/Family/Friend Insurance type: Medicaid      Crisis Care Plan Living Arrangements: Alone Name of Psychiatrist: unable to assess Name of Therapist: unable to assess  Education Status Is patient currently in school?: No  Risk to self with the past 6 months Suicidal Ideation: No Has patient been a risk to self within the past 6 months prior to  admission? : No Suicidal Intent: No Has patient had any suicidal intent within the past 6 months prior to admission? : No Is patient at risk for suicide?: No Suicidal Plan?: No Has patient had any suicidal plan within the past 6 months prior to admission? : No Access to Means: No What has been your use of drugs/alcohol within the last 12 months?: Unable to assess Previous Attempts/Gestures:  (unable to assess ) How many times?:  (unable to assess) Other Self Harm  Risks:  (unable to assess) Triggers for Past Attempts:  (unable to assess) Intentional Self Injurious Behavior:  (unable to assess) Family Suicide History: Unable to assess Recent stressful life event(s):  (unable to assess) Persecutory voices/beliefs?:  (unable to assess) Depression:  (unable to assess) Depression Symptoms:  (unable to assess) Substance abuse history and/or treatment for substance abuse?:  (unable to assess) Suicide prevention information given to non-admitted patients: Not applicable  Risk to Others within the past 6 months Homicidal Ideation: No Does patient have any lifetime risk of violence toward others beyond the six months prior to admission? : Unknown Thoughts of Harm to Others: No Current Homicidal Intent: No Current Homicidal Plan: No Access to Homicidal Means: No Identified Victim: N/A History of harm to others?:  (unable to assess) Assessment of Violence: None Noted Violent Behavior Description: No violent behaviors observed at this time.  Does patient have access to weapons?: No Criminal Charges Pending?: No Does patient have a court date: No Is patient on probation?: No  Psychosis Hallucinations:  (unable to asess) Delusions: Unspecified  Mental Status Report Appearance/Hygiene: In scrubs Eye Contact: Good Motor Activity: Freedom of movement Speech: Tangential Level of Consciousness: Quiet/awake Mood: Euthymic Affect: Blunted Anxiety Level: Minimal Thought Processes: Tangential, Irrelevant Judgement: Impaired Orientation: Person Obsessive Compulsive Thoughts/Behaviors: None  Cognitive Functioning Concentration: Decreased Memory: Unable to Assess IQ: Average Insight: Poor Impulse Control: Unable to Assess Appetite: Good Sleep: Unable to Assess Vegetative Symptoms: Unable to Assess  ADLScreening [facility] Assessment Services) Patient's cognitive ability adequate to safely complete daily activities?: Yes Patient able to express need for  assistance with ADLs?: Yes Independently performs ADLs?: Yes (appropriate for developmental age)  Prior Inpatient Therapy Prior Inpatient Therapy: Yes Prior Therapy Dates: 2001, 2002, 2005 Prior Therapy Facilty/Provider(s): [facility] Reason for Treatment: Schizophrenia   Prior Outpatient Therapy Prior Outpatient Therapy: Yes Prior Therapy Dates: current  Prior Therapy Facilty/Provider(s): [facility]  Reason for Treatment: Peer Support  Does patient have an ACCT team?: Unknown Does patient have Intensive In-House Services?  : No Does patient have [facility] services? : Unknown Does patient have P4CC services?: Unknown  ADL Screening (condition at time of admission) Patient's cognitive ability adequate to safely complete daily activities?: Yes Is the patient deaf or have difficulty hearing?: No Does the patient have difficulty seeing, even when wearing glasses/contacts?: No Does the patient have difficulty concentrating, remembering, or making decisions?: No Patient able to express need for assistance with ADLs?: Yes Does the patient have difficulty dressing or bathing?: No Independently performs ADLs?: Yes (appropriate for developmental age)       Abuse/Neglect Assessment (Assessment to be complete while patient is alone) Physical Abuse:  (unable to assess) Verbal Abuse:  (unable to assess) Sexual Abuse:  (unable to assess) Exploitation of patient/patient's resources:  (unable to assess) Self-Neglect:  (unable to assess)     Advance Directives (For Healthcare) Does patient have an advance directive?: No Would patient like information on creating an advanced directive?: No - patient declined  information    Additional Information 1:1 In Past 12 Months?: Yes CIRT Risk: No Elopement Risk: No Does patient have medical clearance?: Yes     Disposition:  Disposition Initial Assessment Completed for this Encounter: Yes Disposition of Patient: Inpatient treatment  program Type of inpatient treatment program: Adult  Cylan Borum S 02/15/2016 9:27 PM

## 2016-02-15 NOTE — ED Notes (Signed)
Pt. Transferred to [facility] from ED to room 38. Report to include Situation, Background, Assessment and [facility]ations [facility]. Pt. Oriented to unit including Q15 minute rounds as well as the security cameras for their protection. Patient is alert, warm and dry in no acute distress. Patient denies SI, HI, and AVH. Pt. Encouraged to let me know if needs arise.

## 2016-02-15 NOTE — ED Notes (Signed)
Bed: [facility] Expected date:  Expected time:  Means of arrival:  Comments: 

## 2016-02-16 ENCOUNTER — Emergency Department (HOSPITAL_COMMUNITY): Payer: Medicaid Other

## 2016-02-16 DIAGNOSIS — F200 Paranoid schizophrenia: Secondary | ICD-10-CM | POA: Diagnosis not present

## 2016-02-16 MED ORDER — TRAZODONE HCL 100 MG PO TABS: 100.0000 mg | ORAL_TABLET | Freq: Every day | ORAL | Status: DC

## 2016-02-16 MED ORDER — DIVALPROEX SODIUM 500 MG PO DR TAB: 1000.0000 mg | DELAYED_RELEASE_TABLET | Freq: Every day | ORAL | Status: DC

## 2016-02-16 MED ORDER — TRIHEXYPHENIDYL HCL 5 MG PO TABS
5.0000 mg | ORAL_TABLET | Freq: Two times a day (BID) | ORAL | Status: DC
  Administered 2016-02-16: 5 mg via ORAL
  Filled 2016-02-16: qty 1

## 2016-02-16 MED ORDER — DIVALPROEX SODIUM 500 MG PO DR TAB
1250.0000 mg | DELAYED_RELEASE_TABLET | Freq: Every day | ORAL | Status: DC
  Administered 2016-02-16: 1250 mg via ORAL
  Filled 2016-02-16: qty 1

## 2016-02-16 MED ORDER — QUETIAPINE FUMARATE 100 MG PO TABS: 200.0000 mg | ORAL_TABLET | Freq: Every day | ORAL | Status: DC

## 2016-02-16 MED ORDER — PALIPERIDONE ER 6 MG PO TB24
6.0000 mg | ORAL_TABLET | Freq: Every day | ORAL | Status: DC
  Administered 2016-02-16: 6 mg via ORAL
  Filled 2016-02-16: qty 1

## 2016-02-16 NOTE — ED Notes (Signed)
Patient noted sleeping in room. No complaints, stable, in no acute distress. Q15 minute rounds and monitoring via Security Cameras to continue.  

## 2016-02-16 NOTE — BH Assessment (Signed)
[facility] Assessment Progress Note  Per Thedore MinsMojeed Akintayo, MD, this pt requires psychiatric hospitalization at this time.  The following facilities have been contacted to seek placement for this pt, with results as noted:  Beds available, information sent, decision pending:  [facility]   At capacity[facility]  [facility]   Doylene Canninghomas Niang [place], [place] Triage Specialist [phone]

## 2016-02-16 NOTE — BH Assessment (Signed)
[facility] Assessment Progress Note  At 16:26 Shanda BumpsJessica calls [facility].  Pt has been accepted to their facility by Dr. Rex KrasVeeraindar Goli.  EDP Gerlene FeeJonathan Knapp, MD, concurs with this decision.  Pt's nurse, Lincoln MaxinOlivette, has been notified, and agrees to call report to [phone].  Pt is under IVC and [facility]  Doylene Canninghomas Aline Wesche, MA Triage Specialist [phone]

## 2016-02-16 NOTE — Consult Note (Signed)
[place] [place] Psychiatry Consult   Reason for Consult:  Bizarre behavior Referring Physician:  EDP Patient Identification: Ronald Roman MRN:  [id number] Principal Diagnosis: Paranoid schizophrenia [facility]) Diagnosis:   Patient Active Problem List   Diagnosis Date Noted  . Acute psychosis [F29]   . Leukocytosis [D72.829] 02/06/2015  . Sleep apnea [G47.30] 02/05/2015  . AKI (acute kidney injury) ([place]) [N17.9] 02/04/2015  . Seizure disorder ([place]) [O29.476] 02/04/2015  . Acute encephalopathy [G93.40] 02/04/2015  . Diabetes mellitus without complication ([place]) [L46.5]   . Hyperlipidemia [E78.5]   . Hypertension [I10]   . Paranoid schizophrenia ([place]) [F20.0]     Total Time spent with patient: 30 minutes  Subjective:   Ronald Roman is a 55 y.o. male patient admitted with bizarre behavior.  HPI:  Per chart review: Ronald Roman is an 55 y.o. male presenting to [facility] due to being petitioned.  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 Pt also began singing "don't you know, I heard it before". Pt reported that he lives alone. Pt denies SI and HI at this time but when asked about hallucinations pt stated "social services want to pay my rent". Pt also shared that he smokes "the Lord's kind of cigarettes".   Collateral information was gathered from the petitioner who reported that when he arrived at pt's house he noted that pt's house was in disarray and there were feces from the kitchen to the bathroom and furniture was turned over. He also shared that pt is diagnosed with schizophrenia and he has been non-compliant with his medication for the past 2 months.  He reported that pt has been banging on walls and slamming doors, walking around and talking about his kids.  He reported that pt is receiving services through [place], [place] and [place].   Patient was seen.  Today with Dr Darleene Cleaver, he  was disorganized.  Stating that he did not need any meds.  He was also vague when asked if he took any medications, seen a mental health provider or if he had an ACTT.  He was malodorous as per earlier reports that he was neglecting his ADL's.    Past Psychiatric History: see HPI  Risk to Self: Suicidal Ideation: No Suicidal Intent: No Is patient at risk for suicide?: No Suicidal Plan?: No Access to Means: No What has been your use of drugs/alcohol within the last 12 months?: Unable to assess How many times?:  (unable to assess) Other Self Harm Risks:  (unable to assess) Triggers for Past Attempts:  (unable to assess) Intentional Self Injurious Behavior:  (unable to assess) Risk to Others: Homicidal Ideation: No Thoughts of Harm to Others: No Current Homicidal Intent: No Current Homicidal Plan: No Access to Homicidal Means: No Identified Victim: N/A History of harm to others?:  (unable to assess) Assessment of Violence: None Noted Violent Behavior Description: No violent behaviors observed at this time.  Does patient have access to weapons?: No Criminal Charges Pending?: No Does patient have a court date: No Prior Inpatient Therapy: Prior Inpatient Therapy: Yes Prior Therapy Dates: 2001, 2002, 2005 Prior [facility] Facilty[facility]Provider([facility]): [facility] Reason for Treatment: Schizophrenia  Prior Outpatient Therapy: Prior Outpatient Therapy: Yes Prior Therapy Dates: current  Prior Therapy Facilty/Provider(s): [facility]  Reason for Treatment: Peer Support  Does patient have an ACCT team?: Unknown Does patient have Intensive In-House Services?  : No  Does patient have [facility] services? : Unknown Does patient have P4CC services?: Unknown  Past Medical History:  Past Medical History:  Diagnosis Date  . Diabetes mellitus without complication ([place])   . GERD (gastroesophageal reflux disease)   . Hyperlipidemia   . Hypertension   . Paranoid schizophrenia ([place])   . Sleep apnea   . Uric  acid nephrolithiasis    History reviewed. No pertinent surgical history. Family History: History reviewed. No pertinent family history. Family Psychiatric  History: see HPI Social History:  History  Alcohol Use No     History  Drug Use No    Social History   Social History  . Marital status: Single    Spouse name: N/A  . Number of children: N/A  . Years of education: N/A   Social History Main Topics  . Smoking status: Current Every Day Smoker    Packs/day: 1.00    Types: Cigarettes  . Smokeless tobacco: None  . Alcohol use No  . Drug use: No  . Sexual activity: Not Asked   Other Topics Concern  . None   Social History Narrative  . None   Additional Social History:    Allergies:   Allergies  Allergen Reactions  . Cogentin [Benztropine] Other (See Comments)    Per MAR   . Penicillins Other (See Comments)    Per MAR - unable to verify patients PCN reaction   . Prolixin [Fluphenazine] Other (See Comments)    Per MAR     Labs:  Results for orders placed or performed during the hospital encounter of 02/15/16 (from the past 48 hour(s))  Comprehensive metabolic panel     Status: Abnormal   Collection Time: 02/15/16  7:45 PM  Result Value Ref Range   Sodium 139 135 - 145 mmol/L   Potassium 3.4 (L) 3.5 - 5.1 mmol/L   Chloride 105 101 - 111 mmol/L   CO2 21 (L) 22 - 32 mmol/L   Glucose, Bld 188 (H) 65 - 99 mg/dL   BUN 23 (H) 6 - 20 mg/dL   Creatinine, Ser 1.20 0.61 - 1.24 mg/dL   Calcium 9.9 8.9 - 10.3 mg/dL   Total Protein 8.0 6.5 - 8.1 g/dL   Albumin 4.5 3.5 - 5.0 g/dL   AST 24 15 - 41 U/L   ALT 18 17 - 63 U/L   Alkaline Phosphatase 109 38 - 126 U/L   Total Bilirubin 0.7 0.3 - 1.2 mg/dL   GFR calc non Af Amer >60 >60 mL/min   GFR calc Af Amer >60 >60 mL/min    Comment: (NOTE) The eGFR has been calculated using the CKD EPI equation. This calculation has not been validated in all clinical situations. eGFR's persistently <60 mL/min signify possible Chronic  Kidney Disease.    Anion gap 13 5 - 15  Ethanol     Status: None   Collection Time: 02/15/16  7:45 PM  Result Value Ref Range   Alcohol, Ethyl (B) <5 <5 mg/dL    Comment:        LOWEST DETECTABLE LIMIT FOR SERUM ALCOHOL IS 5 mg/dL FOR MEDICAL PURPOSES ONLY   Salicylate level     Status: None   Collection Time: 02/15/16  7:45 PM  Result Value Ref Range   Salicylate Lvl <1.6 2.8 - 30.0 mg/dL  Acetaminophen level     Status: Abnormal   Collection Time: 02/15/16  7:45 PM  Result Value Ref Range   Acetaminophen (Tylenol), Serum <10 (L)  10 - 30 ug/mL    Comment:        THERAPEUTIC CONCENTRATIONS VARY SIGNIFICANTLY. A RANGE OF 10-30 ug/mL MAY BE AN EFFECTIVE CONCENTRATION FOR MANY PATIENTS. HOWEVER, SOME ARE BEST TREATED AT CONCENTRATIONS OUTSIDE THIS RANGE. ACETAMINOPHEN CONCENTRATIONS >150 ug/mL AT 4 HOURS AFTER INGESTION AND >50 ug/mL AT 12 HOURS AFTER INGESTION ARE OFTEN ASSOCIATED WITH TOXIC REACTIONS.   cbc     Status: Abnormal   Collection Time: 02/15/16  7:45 PM  Result Value Ref Range   WBC 13.7 (H) 4.0 - 10.5 K/uL   RBC 4.84 4.22 - 5.81 MIL/uL   Hemoglobin 13.3 13.0 - 17.0 g/dL   HCT 40.2 39.0 - 52.0 %   MCV 83.1 78.0 - 100.0 fL   MCH 27.5 26.0 - 34.0 pg   MCHC 33.1 30.0 - 36.0 g/dL   RDW 13.8 11.5 - 15.5 %   Platelets 379 150 - 400 K/uL  Valproic acid level     Status: Abnormal   Collection Time: 02/15/16  7:45 PM  Result Value Ref Range   Valproic Acid Lvl 12 (L) 50.0 - 100.0 ug/mL  Rapid urine drug screen (hospital performed)     Status: None   Collection Time: 02/15/16  7:46 PM  Result Value Ref Range   Opiates NONE DETECTED NONE DETECTED   Cocaine NONE DETECTED NONE DETECTED   Benzodiazepines NONE DETECTED NONE DETECTED   Amphetamines NONE DETECTED NONE DETECTED   Tetrahydrocannabinol NONE DETECTED NONE DETECTED   Barbiturates NONE DETECTED NONE DETECTED    Comment:        DRUG SCREEN FOR MEDICAL PURPOSES ONLY.  IF CONFIRMATION IS NEEDED FOR ANY  PURPOSE, NOTIFY LAB WITHIN 5 DAYS.        LOWEST DETECTABLE LIMITS FOR URINE DRUG SCREEN Drug Class       Cutoff (ng/mL) Amphetamine      1000 Barbiturate      200 Benzodiazepine   161 Tricyclics       096 Opiates          300 Cocaine          300 THC              50     Current Facility-Administered Medications  Medication Dose Route Frequency Provider Last Rate Last Dose  . divalproex (DEPAKOTE) DR tablet 1,000 mg  1,000 mg Oral QHS Cordarrell Sane, MD      . lisinopril (PRINIVIL,ZESTRIL) tablet 10 mg  10 mg Oral Daily Quintella Reichert, MD   10 mg at 02/16/16 0920  . metoprolol tartrate (LOPRESSOR) tablet 25 mg  25 mg Oral BID Quintella Reichert, MD   25 mg at 02/16/16 0920  . paliperidone (INVEGA) 24 hr tablet 6 mg  6 mg Oral Daily Effa Yarrow, MD   6 mg at 02/16/16 1314  . traZODone (DESYREL) tablet 100 mg  100 mg Oral QHS Keonda Dow, MD      . trihexyphenidyl (ARTANE) tablet 5 mg  5 mg Oral BID WC Corena Pilgrim, MD       Current Outpatient Prescriptions  Medication Sig Dispense Refill  . divalproex (DEPAKOTE) 250 MG DR tablet Take 1 tablet (250 mg total) by mouth at bedtime. 30 tablet 0  . divalproex (DEPAKOTE) 500 MG DR tablet Take 2 tablets (1,000 mg total) by mouth at bedtime. 60 tablet 0  . lisinopril (PRINIVIL,ZESTRIL) 10 MG tablet Take 10 mg by mouth daily.     Marland Kitchen LORazepam (ATIVAN) 0.5 MG tablet  Take 1 tablet (0.5 mg total) by mouth 2 (two) times daily. (Patient taking differently: Take 0.5 mg by mouth daily as needed (anxiety). ) 60 tablet 0  . metoprolol tartrate (LOPRESSOR) 25 MG tablet Take 25 mg by mouth 2 (two) times daily.    . QUEtiapine (SEROQUEL) 100 MG tablet Take 1 tablet (100 mg total) by mouth at bedtime. 30 tablet 0  . QUEtiapine (SEROQUEL) 50 MG tablet Take 1 tablet (50 mg total) by mouth daily. (Patient taking differently: Take 50 mg by mouth every morning. ) 30 tablet 0  . albuterol (PROVENTIL HFA;VENTOLIN HFA) 108 (90 BASE) MCG/ACT inhaler Inhale 2  puffs into the lungs every 4 (four) hours as needed for wheezing or shortness of breath. (Patient not taking: Reported on 02/15/2016) 1 Inhaler 0  . cetirizine (ZYRTEC) 10 MG chewable tablet Chew 1 tablet (10 mg total) by mouth daily. (Patient not taking: Reported on 02/15/2016) 20 tablet 1  . cloZAPine (CLOZARIL) 100 MG tablet Take 1 tablet (100 mg total) by mouth 2 (two) times daily. (Patient not taking: Reported on 02/15/2016) 60 tablet 0  . insulin glargine (LANTUS) 100 UNIT/ML injection Inject 10 Units into the skin at bedtime.    Marland Kitchen zolpidem (AMBIEN) 5 MG tablet Take 1 tablet (5 mg total) by mouth at bedtime as needed for sleep. (Patient not taking: Reported on 02/15/2016) 30 tablet 0    Musculoskeletal: Strength & Muscle Tone: within normal limits Gait & Station: normal Patient leans: N/A  Psychiatric Specialty Exam: Physical Exam  Nursing note and vitals reviewed.   ROS  Blood pressure 115/72, pulse 70, temperature 98.6 F (37 C), temperature source Oral, resp. rate 16, SpO2 100 %.There is no height or weight on file to calculate BMI.  General Appearance: Disheveled  Eye Contact:  Fair  Speech:  Blocked  Volume:  Normal  Mood:  Anxious  Affect:  Constricted  Thought Process:  Disorganized  Orientation:  Full (Time, Place, and Person)  Thought Content:  Rumination  Suicidal Thoughts:  No  Homicidal Thoughts:  No  Memory:  Immediate;   Fair Recent;   Fair Remote;   Poor  Judgement:  Poor  Insight:  Lacking  Psychomotor Activity:  Normal  Concentration:  Concentration: Poor and Attention Span: Poor  Recall:  Poor  Fund of Knowledge:  Poor  Language:  Fair  Akathisia:  Negative  Handed:  Right  AIMS (if indicated):     Assets:  Resilience  ADL's:  Impaired  Cognition:  Impaired,  Mild  Sleep:      Treatment Plan Summary: Daily contact with patient to assess and evaluate symptoms and progress in treatment, Medication management and Plan seek inpatient  treamtment  Disposition: Recommend psychiatric Inpatient admission when medically cleared. Supportive therapy provided about ongoing stressors.  Janett Labella, NP [facility] 02/16/2016 2:50 PM  Patient seen face-to-face for psychiatric evaluation, chart reviewed and case discussed with the physician extender and developed treatment plan. Reviewed the information documented and agree with the treatment plan. Corena Pilgrim, MD

## 2016-02-16 NOTE — Progress Notes (Signed)
Pt presents with blunt affect and anxious mood on initial approach. Denies SI, HI, AVH and pain. Pt is disorganized, tangential with flight of ideas. "I want to leave, the social service people are at my house with some cigarettes" "where are those people that are coming here". Pt is verbally redirectable. Showered and changed scrubs. Tolerated all PO intake well. Compliant with medications when offered. Support and availability provided to pt.  Encouraged to voice needs / concerns. Safety maintained on Q 15 minutes checks without outburst or self harm gestures.

## 2016-02-16 NOTE — Progress Notes (Signed)
Inpatient Diabetes Program Recommendations  AACE/ADA: New Consensus Statement on Inpatient Glycemic Control (2015)  Target Ranges:  Prepandial:   less than 140 mg/dL      Peak postprandial:   less than 180 mg/dL (1-2 hours)      Critically ill patients:  140 - 180 mg/dL   Results for Ronald Roman, Acea D (MRN [id number]) as of 02/16/2016 11:33  Ref. Range 02/15/2016 19:45  Glucose Latest Ref Range: 65 - 99 mg/dL 956188 (H)    To ED with: IVC  History: DM, Schizophrenia  Home DM Meds: Lantus 10 units QHS       Metformin 1000 mg bid       Januvia 100 mg daily  Current Insulin Orders: None     MD- Please consider starting home DM medications while patient awaiting Psych Evaluation.  Please also consider starting Novolog Sensitive Correction Scale/ SSI (0-9 units) TID AC + HS per the Glycemic Control order set      --Will follow patient during hospitalization--  Ambrose FinlandJeannine Johnston Emiliana Blaize RN, MSN, CDE Diabetes Coordinator Inpatient Glycemic Control Team Team Pager: [phone] (8a-5p)

## 2016-03-01 ENCOUNTER — Encounter (HOSPITAL_COMMUNITY): Payer: Self-pay | Admitting: Emergency Medicine

## 2016-03-01 ENCOUNTER — Emergency Department (HOSPITAL_COMMUNITY): Payer: Medicaid Other

## 2016-03-01 ENCOUNTER — Emergency Department (HOSPITAL_COMMUNITY)
Admission: EM | Admit: 2016-03-01 | Discharge: 2016-03-02 | Disposition: A | Discharge status: Other Acute Inpatient Hospital | Payer: Medicaid Other | Attending: Emergency Medicine | Admitting: Emergency Medicine

## 2016-03-01 DIAGNOSIS — R000 Tachycardia, unspecified: Secondary | ICD-10-CM | POA: Diagnosis not present

## 2016-03-01 DIAGNOSIS — Z79899 Other long term (current) drug therapy: Secondary | ICD-10-CM | POA: Insufficient documentation

## 2016-03-01 DIAGNOSIS — Z046 Encounter for general psychiatric examination, requested by authority: Secondary | ICD-10-CM | POA: Diagnosis present

## 2016-03-01 DIAGNOSIS — F200 Paranoid schizophrenia: Secondary | ICD-10-CM | POA: Insufficient documentation

## 2016-03-01 DIAGNOSIS — I10 Essential (primary) hypertension: Secondary | ICD-10-CM | POA: Insufficient documentation

## 2016-03-01 DIAGNOSIS — F17210 Nicotine dependence, cigarettes, uncomplicated: Secondary | ICD-10-CM | POA: Diagnosis not present

## 2016-03-01 DIAGNOSIS — Z794 Long term (current) use of insulin: Secondary | ICD-10-CM | POA: Insufficient documentation

## 2016-03-01 DIAGNOSIS — Z888 Allergy status to other drugs, medicaments and biological substances status: Secondary | ICD-10-CM | POA: Diagnosis not present

## 2016-03-01 DIAGNOSIS — E119 Type 2 diabetes mellitus without complications: Secondary | ICD-10-CM | POA: Diagnosis not present

## 2016-03-01 DIAGNOSIS — Z880 Allergy status to penicillin: Secondary | ICD-10-CM | POA: Diagnosis not present

## 2016-03-01 LAB — COMPREHENSIVE METABOLIC PANEL
ALBUMIN: 4.4 g/dL (ref 3.5–5.0)
ALK PHOS: 87 U/L (ref 38–126)
ALT: 23 U/L (ref 17–63)
ANION GAP: 10 (ref 5–15)
AST: 24 U/L (ref 15–41)
BILIRUBIN TOTAL: 0.6 mg/dL (ref 0.3–1.2)
BUN: 13 mg/dL (ref 6–20)
CALCIUM: 9.4 mg/dL (ref 8.9–10.3)
CO2: 23 mmol/L (ref 22–32)
Chloride: 101 mmol/L (ref 101–111)
Creatinine, Ser: 1.09 mg/dL (ref 0.61–1.24)
GFR calc non Af Amer: >60 mL/min (ref >60)
Glucose, Bld: 289 mg/dL — ABNORMAL HIGH (ref 65–99)
POTASSIUM: 3.6 mmol/L (ref 3.5–5.1)
SODIUM: 134 mmol/L — AB (ref 135–145)
TOTAL PROTEIN: 7.6 g/dL (ref 6.5–8.1)

## 2016-03-01 LAB — RAPID URINE DRUG SCREEN, HOSP PERFORMED
Amphetamines: NOT DETECTED
BENZODIAZEPINES: NOT DETECTED
Barbiturates: NOT DETECTED
COCAINE: NOT DETECTED
OPIATES: NOT DETECTED
Tetrahydrocannabinol: NOT DETECTED

## 2016-03-01 LAB — CBC
HCT: 42 % (ref 39.0–52.0)
Hemoglobin: 13.7 g/dL (ref 13.0–17.0)
MCH: 27.1 pg (ref 26.0–34.0)
MCHC: 32.6 g/dL (ref 30.0–36.0)
MCV: 83.2 fL (ref 78.0–100.0)
PLATELETS: 256 10*3/uL (ref 150–400)
RBC: 5.05 MIL/uL (ref 4.22–5.81)
RDW: 13.4 % (ref 11.5–15.5)
WBC: 13 10*3/uL — AB (ref 4.0–10.5)

## 2016-03-01 LAB — VALPROIC ACID LEVEL: Valproic Acid Lvl: <10 ug/mL — ABNORMAL LOW (ref 50.0–100.0)

## 2016-03-01 LAB — SALICYLATE LEVEL

## 2016-03-01 LAB — ACETAMINOPHEN LEVEL

## 2016-03-01 LAB — CBG MONITORING, ED: Glucose-Capillary: 148 mg/dL — ABNORMAL HIGH (ref 65–99)

## 2016-03-01 LAB — ETHANOL: Alcohol, Ethyl (B): <5 mg/dL (ref <5)

## 2016-03-01 MED ORDER — INSULIN ASPART 100 UNIT/ML ~~LOC~~ SOLN
0.0000 [IU] | Freq: Three times a day (TID) | SUBCUTANEOUS | Status: DC
  Administered 2016-03-02: 08:00:00 via SUBCUTANEOUS
  Administered 2016-03-02: 5 [IU] via SUBCUTANEOUS
  Filled 2016-03-01: qty 1

## 2016-03-01 MED ORDER — LORAZEPAM 1 MG PO TABS: 1.0000 mg | ORAL_TABLET | Freq: Three times a day (TID) | ORAL | Status: DC | PRN

## 2016-03-01 MED ORDER — INSULIN ASPART 100 UNIT/ML ~~LOC~~ SOLN: 0.0000 [IU] | Freq: Every day | SUBCUTANEOUS | Status: DC

## 2016-03-01 MED ORDER — SODIUM CHLORIDE 0.9 % IV BOLUS (SEPSIS)
1000.0000 mL | Freq: Once | INTRAVENOUS | Status: AC
  Administered 2016-03-01: 1000 mL via INTRAVENOUS

## 2016-03-01 NOTE — BHH Counselor (Signed)
Pt has been referred to the following inpatient facilities:   [facility]   Gwinda Passereylese D Bennett, [place], [facility], [facility] Triage Specialist [phone]

## 2016-03-01 NOTE — ED Notes (Signed)
Bed: WTR5 Expected date:  Expected time:  Means of arrival:  Comments: 

## 2016-03-01 NOTE — ED Provider Notes (Signed)
[facility]-EMER[facility]ENCY DEPT Provider Note   CSN: [id number] Arrival date & time: 03/01/16  1627     History   Chief Complaint Chief Complaint  Patient presents with  . IVC    HPI Ronald Roman is a 55 y.o. male.  HPI Level V caveat due to psychiatric disorder.  Patient is a 55 year old male with a history of GERD, DM, HTN, paranoid schizophrenia who presents the emergency department for IVC. Patient is being IVC by social work per [employer] paperwork. They state patient has not been compliant with medications. [employer] states they found the patient fully clothed in the shower in his apartment. Pt denies any pain. He states he has had diarrhea but cannot tell me for how long. Pt states he has been taking his medications. He has no other complaints.   Past Medical History:  Diagnosis Date  . Diabetes mellitus without complication (HCC)   . GERD (gastroesophageal reflux disease)   . Hyperlipidemia   . Hypertension   . Paranoid schizophrenia (HCC)   . Sleep apnea   . Uric acid nephrolithiasis     Patient Active Problem List   Diagnosis Date Noted  . Acute psychosis   . Leukocytosis 02/06/2015  . Sleep apnea 02/05/2015  . AKI (acute kidney injury) (HCC) 02/04/2015  . Seizure disorder (HCC) 02/04/2015  . Acute encephalopathy 02/04/2015  . Diabetes mellitus without complication (HCC)   . Hyperlipidemia   . Hypertension   . Paranoid schizophrenia (HCC)     History reviewed. No pertinent surgical history.     Home Medications    Prior to Admission medications   Medication Sig Start Date End Date Taking? Authorizing Provider  albuterol (PROVENTIL HFA;VENTOLIN HFA) 108 (90 BASE) MCG/ACT inhaler Inhale 2 puffs into the lungs every 4 (four) hours as needed for wheezing or shortness of breath. 04/21/15  Yes Hayden Rasmussen, NP  divalproex (DEPAKOTE) 250 MG DR tablet Take 1 tablet (250 mg total) by mouth at bedtime. 03/27/15  Yes Charm Rings, NP  divalproex (DEPAKOTE) 500 MG DR tablet Take 2  tablets (1,000 mg total) by mouth at bedtime. 03/27/15  Yes Charm Rings, NP  lisinopril (PRINIVIL,ZESTRIL) 10 MG tablet Take 10 mg by mouth daily.    Yes Historical Provider, MD  LORazepam (ATIVAN) 0.5 MG tablet Take 1 tablet (0.5 mg total) by mouth 2 (two) times daily. Patient taking differently: Take 0.5 mg by mouth daily as needed (anxiety).  03/27/15  Yes Charm Rings, NP  metoprolol tartrate (LOPRESSOR) 25 MG tablet Take 25 mg by mouth 2 (two) times daily.   Yes Historical Provider, MD  OVER THE COUNTER MEDICATION "Laxative."   Yes Historical Provider, MD  Pseudoephedrine-Acetaminophen (COLD TABLETS PO) Take by mouth.   Yes Historical Provider, MD  cetirizine (ZYRTEC) 10 MG chewable tablet Chew 1 tablet (10 mg total) by mouth daily. Patient not taking: Reported on 02/15/2016 04/21/15   Hayden Rasmussen, NP  cloZAPine (CLOZARIL) 100 MG tablet Take 1 tablet (100 mg total) by mouth 2 (two) times daily. Patient not taking: Reported on 02/15/2016 03/27/15   Charm Rings, NP  insulin glargine (LANTUS) 100 UNIT/ML injection Inject 10 Units into the skin at bedtime.    Historical Provider, MD  QUEtiapine (SEROQUEL) 100 MG tablet Take 1 tablet (100 mg total) by mouth at bedtime. Patient not taking: Reported on 03/01/2016 03/27/15   Charm Rings, NP  QUEtiapine (SEROQUEL) 50 MG tablet Take 1 tablet (50 mg total) by mouth daily. Patient  not taking: Reported on 03/01/2016 03/27/15   Charm RingsJamison Y Lord, NP  zolpidem (AMBIEN) 5 MG tablet Take 1 tablet (5 mg total) by mouth at bedtime as needed for sleep. Patient not taking: Reported on 03/01/2016 03/27/15   Charm RingsJamison Y Lord, NP    Family History No family history on file.  Social History Social History  Substance Use Topics  . Smoking status: Current Every Day Smoker    Packs/day: 1.00    Types: Cigarettes  . Smokeless tobacco: Not on file  . Alcohol use No     Allergies   Cogentin [benztropine]; Penicillins; and Prolixin [fluphenazine]   Review of  Systems Review of Systems  Unable to perform ROS: Psychiatric disorder     Physical Exam Updated Vital Signs BP 155/98 (BP Location: Right Arm)   Pulse (!) 125   Temp 98.5 F (36.9 C) (Oral)   Resp 18   SpO2 100%   Physical Exam  Constitutional: He appears well-developed and well-nourished. No distress.  HENT:  Head: Normocephalic and atraumatic.  Eyes: Conjunctivae are normal.  Cardiovascular: Regular rhythm and normal heart sounds.  Tachycardia present.  Exam reveals no gallop and no friction rub.   No murmur heard. Pulses:      Radial pulses are 2+ on the right side, and 2+ on the left side.  Pulmonary/Chest: Effort normal. No respiratory distress.  Abdominal: Soft. There is no tenderness.  Musculoskeletal: Normal range of motion.  Neurological: He is alert. Coordination normal.  Skin: Skin is warm and dry. He is not diaphoretic.  Psychiatric: He has a normal mood and affect. His behavior is normal.  Nursing note and vitals reviewed.     ED Treatments / Results  Labs (all labs ordered are listed, but only abnormal results are displayed) Labs Reviewed  COMPREHENSIVE METABOLIC PANEL - Abnormal; Notable for the following:       Result Value   Sodium 134 (*)    Glucose, Bld 289 (*)    All other components within normal limits  ACETAMINOPHEN LEVEL - Abnormal; Notable for the following:    Acetaminophen (Tylenol), Serum <10 (*)    All other components within normal limits  CBC - Abnormal; Notable for the following:    WBC 13.0 (*)    All other components within normal limits  VALPROIC ACID LEVEL - Abnormal; Notable for the following:    Valproic Acid Lvl <10 (*)    All other components within normal limits  CBG MONITORING, ED - Abnormal; Notable for the following:    Glucose-Capillary 148 (*)    All other components within normal limits  ETHANOL  SALICYLATE LEVEL  RAPID URINE DRUG SCREEN, HOSP PERFORMED    EKG  EKG Interpretation None        Radiology Dg Chest 2 View  Result Date: 03/01/2016 CLINICAL DATA:  55 year old male with weakness and agitation. Initial encounter. EXAM: CHEST  2 VIEW COMPARISON:  02/16/2016 and 05/28/2015 chest x-ray FINDINGS: Slightly rotated exam. No infiltrate, congestive heart failure or pneumothorax. No plain film evidence pulmonary malignancy. Rotation causes slight distortion of mediastinal and cardiac silhouette without obvious abnormality. IMPRESSION: Slightly rotated exam without acute pulmonary abnormality noted. Electronically Signed   By: Lacy DuverneySteven  Olson M.D.   On: 03/01/2016 19:21    Procedures Procedures (including critical care time)  Medications Ordered in ED Medications  LORazepam (ATIVAN) tablet 1 mg (not administered)  insulin aspart (novoLOG) injection 0-5 Units (0 Units Subcutaneous Not Given 03/01/16 2212)  insulin aspart (  novoLOG) injection 0-15 Units (not administered)  albuterol (PROVENTIL HFA;VENTOLIN HFA) 108 (90 Base) MCG/ACT inhaler 2 puff (not administered)  lisinopril (PRINIVIL,ZESTRIL) tablet 10 mg (not administered)  metoprolol tartrate (LOPRESSOR) tablet 25 mg (not administered)  QUEtiapine (SEROQUEL) tablet 50 mg (not administered)  divalproex (DEPAKOTE) DR tablet 250 mg (not administered)  sodium chloride 0.9 % bolus 1,000 mL (0 mLs Intravenous Stopped 03/01/16 2138)     Initial Impression / Assessment and Plan / ED Course  I have reviewed the triage vital signs and the nursing notes.  Pertinent labs & imaging results that were available during my care of the patient were reviewed by me and considered in my medical decision making (see chart for details).  Clinical Course    Patient was tachycardic upon arrival to the ED. Patient has no physical complaints. Patient with mildly elevated nonspecific leukocytosis. Chest x-ray reviewed by me was negative for acute infiltrates. We'll give patient a liter of fluids. Tachycardia likely secondary to dehydration.  Patient's tachycardia improved. Patient has been IVC by Child psychotherapist. Labs unremarkable. Patient afebrile, VSS. He is medically cleared for TTS evaluation. TTS will determine patient's disposition. Psych holding orders have been placed.  Final Clinical Impressions(s) / ED Diagnoses   Final diagnoses:  Paranoid schizophrenia [facility])    New Prescriptions New Prescriptions   No medications on file     Jerre Simon, [place] 03/02/16 0007    [name], MD 03/02/16 1115

## 2016-03-01 NOTE — BH Assessment (Addendum)
Tele Assessment Note   Ronald Roman is an 55 y.o. male, who presents involuntarily and unaccompanied to [facility]. Pt reported: "I don't know, I'm just here." Pt was a poor historian throughout the assessment. Pt reported to clinician that he had "supplying food for Hercules, 'to let him rule';  he was told not to say anything; and he's up because this boy was stealing everything." Pt did not clarify his statements. Pt called clinician, Corrie Dandy and Liborio Nixon,' during the assessment.   Pt was IVC'd by his Event organiser at [employer]. Per IVC paperwork: "the respondent has been diagnosed as schizophrenia and was prescribed several medications that he does not take. The respondent has barricaded himself in his room and reports auditory and visual hallucinations. The respondent thinks he is pregnant and say that he is 'eating for his children." The respondent states that he 'is having a C-Section' to deliver his baby. The respondent has a history of commitment to [employer], and is a danger to himself."   Per the Child psychotherapist, pt was released from [facility] on Friday (02/27/16) with no medication. Social worker reported over the weekend, pt left his stove on high, the shower running and his door opened. Social worker reported the pt was taken off of his Clonopin while at [place]. Social worker reported, pt receives services through [employer] ([employer]), [facility] (peer support) and Transport planner (medication management).   Pt presented in scrubs, with tangential speech. Pt's mood was euthymic. Pt's affect was blunted. Pt's judgement was impaired. Pt's concentration, insight, and impulse control are poor. Pt was not oriented. Pt's thought process was tangential and circumstantial. Pt appeared to be responding to internal stimuli.    Diagnosis: Paranoid Schizophrenia (HCC)  Past Medical History:  Past Medical History:  Diagnosis Date  . Diabetes mellitus without complication (HCC)   . GERD  (gastroesophageal reflux disease)   . Hyperlipidemia   . Hypertension   . Paranoid schizophrenia (HCC)   . Sleep apnea   . Uric acid nephrolithiasis     History reviewed. No pertinent surgical history.  Family History: No family history on file.  Social History:  reports that he has been smoking Cigarettes.  He has been smoking about 1.00 pack per day. He does not have any smokeless tobacco history on file. He reports that he does not drink alcohol or use drugs.  Additional Social History:  Alcohol / Drug Use Pain Medications: Pt denies.  Prescriptions: Pt denies.  Over the Counter: Pt denies.   CIWA: CIWA-Ar BP: 123/61 Pulse Rate: 89 COWS:    PATIENT STRENGTHS: (choose at least two) Average or above average intelligence Capable of independent living  Allergies:  Allergies  Allergen Reactions  . Cogentin [Benztropine] Other (See Comments)    Per MAR   . Penicillins Other (See Comments)    Per MAR - unable to verify patients PCN reaction   . Prolixin [Fluphenazine] Other (See Comments)    Per MAR     Home Medications:  (Not in a hospital admission)  OB/GYN Status:  No LMP for male patient.  General Assessment Data Location of Assessment: [facility] ED TTS Assessment: In system Is this a Tele or Face-to-Face Assessment?: Tele Assessment Is this an Initial Assessment or a Re-assessment for this encounter?: Initial Assessment Marital status: Single Maiden name: NA Is patient pregnant?: No Pregnancy Status: No Living Arrangements: Alone Can pt return to current living arrangement?: Yes Admission Status: Involuntary Is patient capable of signing voluntary  admission?: Yes Referral Source: Other Secondary school teacher, Pharmacist, community) Insurance type: Medicaid     Crisis Care Plan Living Arrangements: Alone Legal Guardian: Other: (Self) Name of Psychiatrist: unable to assess Name of Therapist: unable to assess  Education Status Is patient currently in school?: No Current  Grade: NA 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 :  (UTA) Suicidal Intent:  (UTA) Has patient had any suicidal intent within the past 6 months prior to admission? :  (UTA) Is patient at risk for suicide?: No Suicidal Plan?: No Has patient had any suicidal plan within the past 6 months prior to admission? :  (UTA) Access to Means:  (UTA) What has been your use of drugs/alcohol within the last 12 months?: UTA Previous Attempts/Gestures:  (UTA) How many times?:  (UTA) Other Self Harm Risks: UTA Triggers for Past Attempts: Unknown Intentional Self Injurious Behavior:  (UTA) Family Suicide History: Unable to assess Recent stressful life event(s):  (UTA) Persecutory voices/beliefs?:  Rich Reining) Depression:  (UTA) Depression Symptoms:  (UTA) Substance abuse history and/or treatment for substance abuse?: No Suicide prevention information given to non-admitted patients:  (UTA)  Risk to Others within the past 6 months Homicidal Ideation: No Does patient have any lifetime risk of violence toward others beyond the six months prior to admission? : Unknown Thoughts of Harm to Others:  (UTA) Current Homicidal Intent:  (UTA) Current Homicidal Plan:  (UTA) Access to Homicidal Means:  (UTA) Identified Victim: UTA History of harm to others?:  (UTA) Assessment of Violence:  (UTA) Violent Behavior Description: UTA Does patient have access to weapons?: No Criminal Charges Pending?: No Does patient have a court date: No Is patient on probation?: No  Psychosis Hallucinations:  (UTA) Delusions: Unspecified  Mental Status Report Appearance/Hygiene: In scrubs Eye Contact: Poor Motor Activity: Freedom of movement Speech: Tangential Level of Consciousness: Quiet/awake Mood: Euthymic Affect: Blunted Anxiety  Level:  (UTA) Thought Processes: Tangential, Circumstantial Judgement: Impaired Orientation: Not oriented Obsessive Compulsive Thoughts/Behaviors: Unable to Assess  Cognitive Functioning Concentration: Poor Memory: Unable to Assess IQ: Average Insight: Poor Impulse Control: Poor Appetite: Fair Weight Loss: 0 Weight Gain: 0 Sleep: Unable to Assess Total Hours of Sleep:  (UTA) Vegetative Symptoms: Unable to Assess  ADLScreening [facility] Assessment Services) Patient's cognitive ability adequate to safely complete daily activities?: No Patient able to express need for assistance with ADLs?: Yes Independently performs ADLs?: Yes (appropriate for developmental age)  Prior Inpatient Therapy Prior Inpatient Therapy: Yes Prior Therapy Dates: 2001, 2002, 2005, 2017 Prior Therapy Facilty/Provider(s): [facility], [facility] Reason for Treatment: Schizophrenia   Prior Outpatient Therapy Prior Outpatient Therapy: Yes Prior Therapy Dates: current  Prior Therapy Facilty/Provider(s): [facility], [facility], [facility] Reason for Treatment: Peer Support, medication management, Transition medical support worker Does patient have an ACCT team?: Unknown Does patient have Intensive In-House Services?  : Unknown Does patient have [facility] services? : Yes Does patient have P4CC services?: Unknown  ADL Screening (condition at time of admission) Patient's cognitive ability adequate to safely complete daily activities?: No Is the patient deaf or have difficulty hearing?: No Does the patient have difficulty seeing, even when wearing glasses/contacts?: No Does the patient have difficulty concentrating, remembering, or making decisions?: Yes (Pt has difficulty concentrating. ) Patient able to express need for assistance with ADLs?: Yes Does  the patient have difficulty dressing or bathing?: No Independently performs ADLs?: Yes (appropriate for developmental age) Does the patient have difficulty walking or climbing  stairs?: No Weakness of Legs: None Weakness of Arms/Hands: None       Abuse/Neglect Assessment (Assessment to be complete while patient is alone) Physical Abuse:  (UTA) Verbal Abuse:  (UTA) Sexual Abuse:  (UTA) Exploitation of patient/patient's resources:  (UTA) Self-Neglect:  (UTA)     Advance Directives (For Healthcare) Does patient have an advance directive?: No Would patient like information on creating an advanced directive?: No - patient declined information    Additional Information 1:1 In Past 12 Months?: Yes CIRT Risk: No Elopement Risk: No Does patient have medical clearance?: Yes    Disposition: Donell SievertSpencer Simon, PA, recommends inpatient treatment. Disposition was discussed with Joanie CoddingtonLatricia, RN. [facility] Delor[facility] Jacksonori[facility] will seek placement.   Disposition Initial Assessment Completed for this Encounter: Yes Disposition of Patient: Inpatient treatment program Type of inpatient treatment program: Adult  Gwinda Passereylese D Bennett 03/01/2016 11:01 PM   Gwinda Passereylese D Bennett, MS, [facility], [facility] Triage Specialist [phone]

## 2016-03-01 NOTE — ED Triage Notes (Signed)
Pt brought in by [employer] with IVC paperwork. Pt IVC'd by social work. Per IVC paperwork Pt has hx of schizophrenia and is noncompliant with medications. Pt barricaded himself in his room and is reporting auditory and visual hallucinations. Pt believes he is pregnant and says he is having a c-section to deliver his baby. [facility]. Pt has not expressed SI or HI to [employer] or social work. Pt was talking to [employer] about puppies he had in his apartment but [employer]PD found no puppies.

## 2016-03-01 NOTE — ED Notes (Signed)
Pt eval by TTS. 

## 2016-03-01 NOTE — ED Notes (Signed)
Pt with IVC papers taken out by Child psychotherapistsocial worker, reports pt talking about being pregnant and having a C-Section and that puppies were in his apartment, when there weren't any as per [employer].  Pt denies all symptoms and all history.  Pt calm & cooperative at present, no distress noted.  Monitoring for safety, Q 15 min checks in effect.

## 2016-03-02 DIAGNOSIS — F17210 Nicotine dependence, cigarettes, uncomplicated: Secondary | ICD-10-CM

## 2016-03-02 DIAGNOSIS — Z888 Allergy status to other drugs, medicaments and biological substances status: Secondary | ICD-10-CM

## 2016-03-02 DIAGNOSIS — F200 Paranoid schizophrenia: Secondary | ICD-10-CM | POA: Diagnosis not present

## 2016-03-02 DIAGNOSIS — Z880 Allergy status to penicillin: Secondary | ICD-10-CM

## 2016-03-02 DIAGNOSIS — Z79899 Other long term (current) drug therapy: Secondary | ICD-10-CM

## 2016-03-02 LAB — CBG MONITORING, ED
GLUCOSE-CAPILLARY: 198 mg/dL — AB (ref 65–99)
GLUCOSE-CAPILLARY: 215 mg/dL — AB (ref 65–99)

## 2016-03-02 MED ORDER — DIVALPROEX SODIUM 250 MG PO DR TAB: 250.0000 mg | DELAYED_RELEASE_TABLET | Freq: Every day | ORAL | Status: DC

## 2016-03-02 MED ORDER — ALBUTEROL SULFATE HFA 108 (90 BASE) MCG/ACT IN AERS: 2.0000 | INHALATION_SPRAY | RESPIRATORY_TRACT | Status: DC | PRN

## 2016-03-02 MED ORDER — QUETIAPINE FUMARATE 100 MG PO TABS
100.0000 mg | ORAL_TABLET | Freq: Two times a day (BID) | ORAL | Status: DC
  Administered 2016-03-02: 100 mg via ORAL
  Filled 2016-03-02: qty 1

## 2016-03-02 MED ORDER — QUETIAPINE FUMARATE 50 MG PO TABS: 50.0000 mg | ORAL_TABLET | Freq: Every day | ORAL | Status: DC

## 2016-03-02 MED ORDER — METOPROLOL TARTRATE 25 MG PO TABS
25.0000 mg | ORAL_TABLET | Freq: Two times a day (BID) | ORAL | Status: DC
  Administered 2016-03-02: 25 mg via ORAL
  Filled 2016-03-02: qty 1

## 2016-03-02 MED ORDER — QUETIAPINE FUMARATE 100 MG PO TABS: 200.0000 mg | ORAL_TABLET | Freq: Every day | ORAL | Status: DC

## 2016-03-02 MED ORDER — LISINOPRIL 10 MG PO TABS
10.0000 mg | ORAL_TABLET | Freq: Every day | ORAL | Status: DC
  Administered 2016-03-02: 10 mg via ORAL
  Filled 2016-03-02: qty 1

## 2016-03-02 MED ORDER — DIVALPROEX SODIUM 500 MG PO DR TAB: 1000.0000 mg | DELAYED_RELEASE_TABLET | Freq: Every day | ORAL | Status: DC

## 2016-03-02 NOTE — ED Notes (Signed)
Pt discharged ambulatory, calm and cooperative with [employer].  All belongings were sent with pt.

## 2016-03-02 NOTE — Progress Notes (Signed)
[facility]. Had questions re: pt's behavior in ED- [facility]  Ilean SkillMeghan Lexi Conaty, MSW, LCSW Clinical Social Work, Disposition  03/02/2016 [phone]

## 2016-03-02 NOTE — BH Assessment (Signed)
Sp[facility] who stated [facility].   Princess BruinsAquicha Duff, MSW, Theresia MajorsLCSWA

## 2016-03-02 NOTE — BH Assessment (Signed)
BHH Assessment Progress Note  Per Thedore MinsMojeed Akintayo, MD, this pt requires psychiatric hospitalization.  Pt presents under IVC which Dr Jannifer FranklinAkintayo has upheld.  At 09:55 Albin FellingCarla calls [facility] to report that pt has been accepted to their facility by Dr Jeannine KittenFarah.  They will be ready to receive pt at 12:00.  Carla requests that report be called with a fresh set of vital signs as [facility]  Dr Jannifer FranklinAkintayo concurs with this decision.  Pt's nurse, Kendal Hymendie, has been notified, and agrees to call report to [phone].  Pt is to be transported via [employer].   Doylene Canninghomas Gilmar Bua, MA Triage Specialist [phone]

## 2016-03-02 NOTE — ED Notes (Addendum)
This patient is difficult to assess as he denies S/I, H/, and AVH.  He acts very bizarre constantly stating "I'm fine"  He has walked into shower fully dressed and does not seem to realize that it is not appropriate.

## 2016-03-02 NOTE — Consult Note (Signed)
[place] [facility] Consult   Reason for Consult:  Hallucinations  Referring Physician:  EDP Patient Identification: Ronald Roman MRN:  [id number] Principal Diagnosis: Paranoid schizophrenia [facility]) Diagnosis:   Patient Active Problem List   Diagnosis Date Noted  . Paranoid schizophrenia ([place]) [F20.0]     Priority: High  . Acute psychosis [F23]   . Leukocytosis [D72.829] 02/06/2015  . Sleep apnea [G47.30] 02/05/2015  . AKI (acute kidney injury) ([place]) [N17.9] 02/04/2015  . Seizure disorder ([place]) [D63.875] 02/04/2015  . Acute encephalopathy [G93.40] 02/04/2015  . Diabetes mellitus without complication ([place]) [I43.3]   . Hyperlipidemia [E78.5]   . Hypertension [I10]     Total Time spent with patient: 45 minutes  Subjective:   Ronald Roman is a 55 y.o. male patient admitted with psychosis.  HPI:  55 yo male who presented to the ED with hallucinations.  Recently discharged from [facility] and stopped taking his medications.  Now he is delusional and responding to internal stimuli.  No suicidal/homicidal ideations or alcohol/drug abuse.  He needs to be on an injectable antipsychotic due to his noncompliance and frequent returns to the hospital.   Past Psychiatric History: schizophrenia  Risk to Self: Suicidal Ideation:  (UTA) Suicidal Intent:  (UTA) Is patient at risk for suicide?: No Suicidal Plan?: No Access to Means:  (UTA) What has been your use of drugs/alcohol within the last 12 months?: UTA How many times?:  (UTA) Other Self Harm Risks: UTA Triggers for Past Attempts: Unknown Intentional Self Injurious Behavior:  (UTA) Risk to Others: Homicidal Ideation: No Thoughts of Harm to Others:  (UTA) Current Homicidal Intent:  (UTA) Current Homicidal Plan:  (UTA) Access to Homicidal Means:  (UTA) Identified Victim: UTA History of harm to others?:  (UTA) Assessment of Violence:  (UTA) Violent Behavior Description: UTA Does patient have access to weapons?:  No Criminal Charges Pending?: No Does patient have a court date: No Prior Inpatient Therapy: Prior Inpatient Therapy: Yes Prior Therapy Dates: 2001, 2002, 2005, 2017 Prior Therapy Facilty/Provider(s): [facility], [facility] Reason for Treatment: Schizophrenia  Prior Outpatient Therapy: Prior Outpatient Therapy: Yes Prior Therapy Dates: current  Prior Therapy Facilty/Provider(s): [facility], [place], [facility] Reason for Treatment: Peer Support, medication management, Transition medical support worker Does patient have an ACCT team?: Unknown Does patient have Intensive In-House Services?  : Unknown Does patient have [facility] services? : Yes Does patient have P4CC services?: Unknown  Past Medical History:  Past Medical History:  Diagnosis Date  . Diabetes mellitus without complication ([place])   . GERD (gastroesophageal reflux disease)   . Hyperlipidemia   . Hypertension   . Paranoid schizophrenia ([place])   . Sleep apnea   . Uric acid nephrolithiasis    History reviewed. No pertinent surgical history. Family History: No family history on file. Family Psychiatric  History: none Social History:  History  Alcohol Use No     History  Drug Use No    Social History   Social History  . Marital status: Single    Spouse name: N/A  . Number of children: N/A  . Years of education: N/A   Social History Main Topics  . Smoking status: Current Every Day Smoker    Packs/day: 1.00    Types: Cigarettes  . Smokeless tobacco: None  . Alcohol use No  . Drug use: No  . Sexual activity: Not Asked   Other Topics Concern  . None   Social History Narrative  . None   Additional Social History:  Allergies:   Allergies  Allergen Reactions  . Cogentin [Benztropine] Other (See Comments)    Per MAR   . Penicillins Other (See Comments)    Per MAR - unable to verify patients PCN reaction   . Prolixin [Fluphenazine] Other (See Comments)    Per MAR     Labs:  Results for orders placed or  performed during the hospital encounter of 03/01/16 (from the past 48 hour(s))  Comprehensive metabolic panel     Status: Abnormal   Collection Time: 03/01/16  4:42 PM  Result Value Ref Range   Sodium 134 (L) 135 - 145 mmol/L   Potassium 3.6 3.5 - 5.1 mmol/L   Chloride 101 101 - 111 mmol/L   CO2 23 22 - 32 mmol/L   Glucose, Bld 289 (H) 65 - 99 mg/dL   BUN 13 6 - 20 mg/dL   Creatinine, Ser 1.09 0.61 - 1.24 mg/dL   Calcium 9.4 8.9 - 10.3 mg/dL   Total Protein 7.6 6.5 - 8.1 g/dL   Albumin 4.4 3.5 - 5.0 g/dL   AST 24 15 - 41 U/L   ALT 23 17 - 63 U/L   Alkaline Phosphatase 87 38 - 126 U/L   Total Bilirubin 0.6 0.3 - 1.2 mg/dL   GFR calc non Af Amer >60 >60 mL/min   GFR calc Af Amer >60 >60 mL/min    Comment: (NOTE) The eGFR has been calculated using the CKD EPI equation. This calculation has not been validated in all clinical situations. eGFR's persistently <60 mL/min signify possible Chronic Kidney Disease.    Anion gap 10 5 - 15  cbc     Status: Abnormal   Collection Time: 03/01/16  4:42 PM  Result Value Ref Range   WBC 13.0 (H) 4.0 - 10.5 K/uL   RBC 5.05 4.22 - 5.81 MIL/uL   Hemoglobin 13.7 13.0 - 17.0 g/dL   HCT 42.0 39.0 - 52.0 %   MCV 83.2 78.0 - 100.0 fL   MCH 27.1 26.0 - 34.0 pg   MCHC 32.6 30.0 - 36.0 g/dL   RDW 13.4 11.5 - 15.5 %   Platelets 256 150 - 400 K/uL  Ethanol     Status: None   Collection Time: 03/01/16  4:45 PM  Result Value Ref Range   Alcohol, Ethyl (B) <5 <5 mg/dL    Comment:        LOWEST DETECTABLE LIMIT FOR SERUM ALCOHOL IS 5 mg/dL FOR MEDICAL PURPOSES ONLY   Salicylate level     Status: None   Collection Time: 03/01/16  4:45 PM  Result Value Ref Range   Salicylate Lvl <8.5 2.8 - 30.0 mg/dL  Acetaminophen level     Status: Abnormal   Collection Time: 03/01/16  4:45 PM  Result Value Ref Range   Acetaminophen (Tylenol), Serum <10 (L) 10 - 30 ug/mL    Comment:        THERAPEUTIC CONCENTRATIONS VARY SIGNIFICANTLY. A RANGE OF 10-30 ug/mL MAY  BE AN EFFECTIVE CONCENTRATION FOR MANY PATIENTS. HOWEVER, SOME ARE BEST TREATED AT CONCENTRATIONS OUTSIDE THIS RANGE. ACETAMINOPHEN CONCENTRATIONS >150 ug/mL AT 4 HOURS AFTER INGESTION AND >50 ug/mL AT 12 HOURS AFTER INGESTION ARE OFTEN ASSOCIATED WITH TOXIC REACTIONS.   Valproic acid level     Status: Abnormal   Collection Time: 03/01/16  4:45 PM  Result Value Ref Range   Valproic Acid Lvl <10 (L) 50.0 - 100.0 ug/mL    Comment: RESULTS CONFIRMED BY MANUAL DILUTION  Rapid urine  drug screen (hospital performed)     Status: None   Collection Time: 03/01/16  6:08 PM  Result Value Ref Range   Opiates NONE DETECTED NONE DETECTED   Cocaine NONE DETECTED NONE DETECTED   Benzodiazepines NONE DETECTED NONE DETECTED   Amphetamines NONE DETECTED NONE DETECTED   Tetrahydrocannabinol NONE DETECTED NONE DETECTED   Barbiturates NONE DETECTED NONE DETECTED    Comment:        DRUG SCREEN FOR MEDICAL PURPOSES ONLY.  IF CONFIRMATION IS NEEDED FOR ANY PURPOSE, NOTIFY LAB WITHIN 5 DAYS.        LOWEST DETECTABLE LIMITS FOR URINE DRUG SCREEN Drug Class       Cutoff (ng/mL) Amphetamine      1000 Barbiturate      200 Benzodiazepine   482 Tricyclics       707 Opiates          300 Cocaine          300 THC              50   CBG monitoring, ED     Status: Abnormal   Collection Time: 03/01/16 10:02 PM  Result Value Ref Range   Glucose-Capillary 148 (H) 65 - 99 mg/dL   Comment 1 Notify RN   CBG monitoring, ED     Status: Abnormal   Collection Time: 03/02/16  7:35 AM  Result Value Ref Range   Glucose-Capillary 198 (H) 65 - 99 mg/dL    Current Facility-Administered Medications  Medication Dose Route Frequency Provider Last Rate Last Dose  . albuterol (PROVENTIL HFA;VENTOLIN HFA) 108 (90 Base) MCG/ACT inhaler 2 puff  2 puff Inhalation Q4H PRN Kalman Drape, PA      . divalproex (DEPAKOTE) DR tablet 250 mg  250 mg Oral QHS Jessica L Focht, PA      . insulin aspart (novoLOG) injection 0-15  Units  0-15 Units Subcutaneous TID WC Jessica L Focht, PA      . insulin aspart (novoLOG) injection 0-5 Units  0-5 Units Subcutaneous QHS Jessica L Focht, PA      . lisinopril (PRINIVIL,ZESTRIL) tablet 10 mg  10 mg Oral Daily Kalman Drape, PA      . metoprolol tartrate (LOPRESSOR) tablet 25 mg  25 mg Oral BID Jessica L Focht, PA      . QUEtiapine (SEROQUEL) tablet 50 mg  50 mg Oral Daily Kalman Drape, PA       Current Outpatient Prescriptions  Medication Sig Dispense Refill  . albuterol (PROVENTIL HFA;VENTOLIN HFA) 108 (90 BASE) MCG/ACT inhaler Inhale 2 puffs into the lungs every 4 (four) hours as needed for wheezing or shortness of breath. 1 Inhaler 0  . divalproex (DEPAKOTE) 250 MG DR tablet Take 1 tablet (250 mg total) by mouth at bedtime. 30 tablet 0  . divalproex (DEPAKOTE) 500 MG DR tablet Take 2 tablets (1,000 mg total) by mouth at bedtime. 60 tablet 0  . lisinopril (PRINIVIL,ZESTRIL) 10 MG tablet Take 10 mg by mouth daily.     Marland Kitchen LORazepam (ATIVAN) 0.5 MG tablet Take 1 tablet (0.5 mg total) by mouth 2 (two) times daily. (Patient taking differently: Take 0.5 mg by mouth daily as needed (anxiety). ) 60 tablet 0  . metoprolol tartrate (LOPRESSOR) 25 MG tablet Take 25 mg by mouth 2 (two) times daily.    Marland Kitchen OVER THE COUNTER MEDICATION "Laxative."    . Pseudoephedrine-Acetaminophen (COLD TABLETS PO) Take by mouth.    . cetirizine (ZYRTEC)  10 MG chewable tablet Chew 1 tablet (10 mg total) by mouth daily. (Patient not taking: Reported on 02/15/2016) 20 tablet 1  . cloZAPine (CLOZARIL) 100 MG tablet Take 1 tablet (100 mg total) by mouth 2 (two) times daily. (Patient not taking: Reported on 02/15/2016) 60 tablet 0  . insulin glargine (LANTUS) 100 UNIT/ML injection Inject 10 Units into the skin at bedtime.    Marland Kitchen QUEtiapine (SEROQUEL) 100 MG tablet Take 1 tablet (100 mg total) by mouth at bedtime. (Patient not taking: Reported on 03/01/2016) 30 tablet 0  . QUEtiapine (SEROQUEL) 50 MG tablet Take 1  tablet (50 mg total) by mouth daily. (Patient not taking: Reported on 03/01/2016) 30 tablet 0  . zolpidem (AMBIEN) 5 MG tablet Take 1 tablet (5 mg total) by mouth at bedtime as needed for sleep. (Patient not taking: Reported on 03/01/2016) 30 tablet 0    Musculoskeletal: Strength & Muscle Tone: within normal limits Gait & Station: normal Patient leans: N/A  Psychiatric Specialty Exam: Physical Exam  Constitutional: He is oriented to person, place, and time. He appears well-developed and well-nourished.  HENT:  Head: Normocephalic.  Neck: Normal range of motion.  Respiratory: Effort normal.  Musculoskeletal: Normal range of motion.  Neurological: He is alert and oriented to person, place, and time.  Skin: Skin is warm and dry.  Psychiatric: He has a normal mood and affect. His speech is normal. Judgment normal. He is actively hallucinating. Thought content is delusional. Cognition and memory are impaired.    Review of Systems  Constitutional: Negative.   HENT: Negative.   Eyes: Negative.   Respiratory: Negative.   Cardiovascular: Negative.   Gastrointestinal: Negative.   Genitourinary: Negative.   Musculoskeletal: Negative.   Skin: Negative.   Neurological: Negative.   Endo/Heme/Allergies: Negative.   Psychiatric/Behavioral: Positive for hallucinations.    Blood pressure 122/73, pulse 96, temperature 98.4 F (36.9 C), temperature source Oral, resp. rate 16, SpO2 97 %.There is no height or weight on file to calculate BMI.  General Appearance: Casual  Eye Contact:  Good  Speech:  Normal Rate  Volume:  Normal  Mood:  Euthymic  Affect:  Blunt  Thought Process:  Coherent and Descriptions of Associations: Circumstantial  Orientation:  Other:  person  Thought Content:  Delusions and Hallucinations: Auditory  Suicidal Thoughts:  No  Homicidal Thoughts:  No  Memory:  Immediate;   Poor Recent;   Poor Remote;   Fair  Judgement:  Impaired  Insight:  Fair  Psychomotor Activity:   Normal  Concentration:  Concentration: Fair and Attention Span: Fair  Recall:  Poor  Fund of Knowledge:  Fair  Language:  Fair  Akathisia:  No  Handed:  Right  AIMS (if indicated):     Assets:  Leisure Time Physical Health Resilience  ADL's:  Intact  Cognition:  Impaired,  Mild  Sleep:        Treatment Plan Summary: Daily contact with patient to assess and evaluate symptoms and progress in treatment, Medication management and Plan schizophrenia, paranoid:  -Crisis stabilization -Medication management:  Medical medications restarted Depakote 1000 mg at bedtime for mood stabilization.  Change Seroquel to 100 mg BID and 200 mg at bedtime for psychosis, recommend changing to [facility] during hospitalization so he can get an injectable---Invega not started in inpatient due to allergy possibilities. -Individual counseling  Disposition: Recommend psychiatric Inpatient admission when medically cleared.  Waylan Boga, NP 03/02/2016 10:02 AM  Patient seen face-to-face for psychiatric evaluation, chart reviewed and case  discussed with the physician extender and developed treatment plan. Reviewed the information documented and agree with the treatment plan. Corena Pilgrim, MD

## 2016-03-18 ENCOUNTER — Emergency Department (HOSPITAL_COMMUNITY)
Admission: EM | Admit: 2016-03-18 | Discharge: 2016-03-19 | Disposition: A | Discharge status: Other Acute Inpatient Hospital | Payer: Medicaid Other | Attending: Emergency Medicine | Admitting: Emergency Medicine

## 2016-03-18 ENCOUNTER — Encounter (HOSPITAL_COMMUNITY): Payer: Self-pay | Admitting: Emergency Medicine

## 2016-03-18 DIAGNOSIS — I10 Essential (primary) hypertension: Secondary | ICD-10-CM | POA: Insufficient documentation

## 2016-03-18 DIAGNOSIS — F200 Paranoid schizophrenia: Secondary | ICD-10-CM | POA: Diagnosis present

## 2016-03-18 DIAGNOSIS — E119 Type 2 diabetes mellitus without complications: Secondary | ICD-10-CM | POA: Insufficient documentation

## 2016-03-18 DIAGNOSIS — F259 Schizoaffective disorder, unspecified: Secondary | ICD-10-CM | POA: Insufficient documentation

## 2016-03-18 DIAGNOSIS — F209 Schizophrenia, unspecified: Secondary | ICD-10-CM

## 2016-03-18 DIAGNOSIS — F17210 Nicotine dependence, cigarettes, uncomplicated: Secondary | ICD-10-CM | POA: Insufficient documentation

## 2016-03-18 DIAGNOSIS — Z79899 Other long term (current) drug therapy: Secondary | ICD-10-CM | POA: Insufficient documentation

## 2016-03-18 LAB — COMPREHENSIVE METABOLIC PANEL
ALBUMIN: 4.1 g/dL (ref 3.5–5.0)
ALT: 17 U/L (ref 17–63)
ANION GAP: 9 (ref 5–15)
AST: 25 U/L (ref 15–41)
Alkaline Phosphatase: 78 U/L (ref 38–126)
BILIRUBIN TOTAL: 0.4 mg/dL (ref 0.3–1.2)
BUN: 22 mg/dL — ABNORMAL HIGH (ref 6–20)
CO2: 22 mmol/L (ref 22–32)
Calcium: 9.7 mg/dL (ref 8.9–10.3)
Chloride: 105 mmol/L (ref 101–111)
Creatinine, Ser: 1.03 mg/dL (ref 0.61–1.24)
Glucose, Bld: 133 mg/dL — ABNORMAL HIGH (ref 65–99)
POTASSIUM: 4.4 mmol/L (ref 3.5–5.1)
Sodium: 136 mmol/L (ref 135–145)
TOTAL PROTEIN: 7.2 g/dL (ref 6.5–8.1)

## 2016-03-18 LAB — CBC
HCT: 41 % (ref 39.0–52.0)
HEMOGLOBIN: 13.5 g/dL (ref 13.0–17.0)
MCH: 26.9 pg (ref 26.0–34.0)
MCHC: 32.9 g/dL (ref 30.0–36.0)
MCV: 81.8 fL (ref 78.0–100.0)
Platelets: 309 10*3/uL (ref 150–400)
RBC: 5.01 MIL/uL (ref 4.22–5.81)
RDW: 13.6 % (ref 11.5–15.5)
WBC: 14.3 10*3/uL — AB (ref 4.0–10.5)

## 2016-03-18 LAB — RAPID URINE DRUG SCREEN, HOSP PERFORMED
AMPHETAMINES: NOT DETECTED
Barbiturates: NOT DETECTED
Benzodiazepines: POSITIVE — AB
COCAINE: NOT DETECTED
OPIATES: NOT DETECTED
TETRAHYDROCANNABINOL: NOT DETECTED

## 2016-03-18 LAB — ETHANOL

## 2016-03-18 LAB — CBG MONITORING, ED: Glucose-Capillary: 127 mg/dL — ABNORMAL HIGH (ref 65–99)

## 2016-03-18 MED ORDER — METOPROLOL TARTRATE 25 MG PO TABS: 25.0000 mg | ORAL_TABLET | Freq: Two times a day (BID) | ORAL | Status: DC

## 2016-03-18 MED ORDER — LORAZEPAM 1 MG PO TABS: 1.0000 mg | ORAL_TABLET | Freq: Three times a day (TID) | ORAL | Status: DC | PRN

## 2016-03-18 MED ORDER — ACETAMINOPHEN 325 MG PO TABS: 650.0000 mg | ORAL_TABLET | ORAL | Status: DC | PRN

## 2016-03-18 MED ORDER — INSULIN GLARGINE 100 UNIT/ML ~~LOC~~ SOLN
10.0000 [IU] | Freq: Every day | SUBCUTANEOUS | Status: DC
  Administered 2016-03-18: 10 [IU] via SUBCUTANEOUS
  Filled 2016-03-18 (×2): qty 0.1

## 2016-03-18 MED ORDER — LORAZEPAM 1 MG PO TABS
0.0000 mg | ORAL_TABLET | Freq: Four times a day (QID) | ORAL | Status: DC
  Administered 2016-03-19: 1 mg via ORAL
  Filled 2016-03-18: qty 1

## 2016-03-18 MED ORDER — ONDANSETRON HCL 4 MG PO TABS: 4.0000 mg | ORAL_TABLET | Freq: Three times a day (TID) | ORAL | Status: DC | PRN

## 2016-03-18 MED ORDER — ALUM & MAG HYDROXIDE-SIMETH 200-200-20 MG/5ML PO SUSP: 30.0000 mL | ORAL | Status: DC | PRN

## 2016-03-18 MED ORDER — QUETIAPINE FUMARATE 50 MG PO TABS
50.0000 mg | ORAL_TABLET | Freq: Every day | ORAL | Status: DC
  Administered 2016-03-18 – 2016-03-19 (×2): 50 mg via ORAL
  Filled 2016-03-18 (×2): qty 1

## 2016-03-18 MED ORDER — IBUPROFEN 200 MG PO TABS: 600.0000 mg | ORAL_TABLET | Freq: Three times a day (TID) | ORAL | Status: DC | PRN

## 2016-03-18 MED ORDER — DIVALPROEX SODIUM 250 MG PO DR TAB
250.0000 mg | DELAYED_RELEASE_TABLET | Freq: Every day | ORAL | Status: DC
  Administered 2016-03-18: 250 mg via ORAL
  Filled 2016-03-18: qty 1

## 2016-03-18 MED ORDER — NICOTINE 21 MG/24HR TD PT24
21.0000 mg | MEDICATED_PATCH | Freq: Every day | TRANSDERMAL | Status: DC
  Administered 2016-03-19: 21 mg via TRANSDERMAL
  Filled 2016-03-18: qty 1

## 2016-03-18 MED ORDER — LORATADINE 10 MG PO TABS
10.0000 mg | ORAL_TABLET | Freq: Every day | ORAL | Status: DC
Start: 2016-03-18 — End: 2016-03-19
  Administered 2016-03-18 – 2016-03-19 (×2): 10 mg via ORAL
  Filled 2016-03-18 (×2): qty 1

## 2016-03-18 MED ORDER — LORAZEPAM 1 MG PO TABS: 0.0000 mg | ORAL_TABLET | Freq: Two times a day (BID) | ORAL | Status: DC

## 2016-03-18 MED ORDER — ALBUTEROL SULFATE HFA 108 (90 BASE) MCG/ACT IN AERS: 2.0000 | INHALATION_SPRAY | RESPIRATORY_TRACT | Status: DC | PRN

## 2016-03-18 MED ORDER — LISINOPRIL 10 MG PO TABS
10.0000 mg | ORAL_TABLET | Freq: Every day | ORAL | Status: DC
  Administered 2016-03-18 – 2016-03-19 (×2): 10 mg via ORAL
  Filled 2016-03-18 (×2): qty 1

## 2016-03-18 NOTE — ED Triage Notes (Signed)
Pt comes from home via [employer].  Neighbors reported that he was in the road naked.  [employer] arrived and found patient in his house naked and talking to himself.  They contacted his case manager and the case manager stated he needed to come here for medical clearance because she doesn't think he is taking his medications.  Cooperative in route.

## 2016-03-18 NOTE — ED Notes (Signed)
Bed: [facility] Expected date:  Expected time:  Means of arrival:  Comments: 

## 2016-03-18 NOTE — BH Assessment (Signed)
Tele Assessment Note   Ronald Roman is an 55 y.o. male who presents to the ED after his neighbors called the police. According to the chart, the pt was in the road naked today and was talking to himself when [employer] arrived to the home. The pt may be non-compliant with his medications according to reports. While speaking with the pt, he reports that he does not know why he came to the hospital and continued speaking in a tangent. Pt stated :I am living with Ronald Roman, she is the love of my life and we are getting married twice this month and you are next." Pt denied S/I and when asked if he has ever experienced H/I, pt stated "let me think about that." Pt then paused for a brief moment and stated "no." Pt appeared disoriented during the assessment as he was distracted by laughing at the television and often stopped mid-sentence and starred blankly at this assessor. Pt also did not know when his birthday was and when asked he stated, "I don't know it was back in the old day, maybe Ronald Roman." Pt denies having a current therapist and stated "they all disgust me." Pt refused to participate in the assessment and began to ignore the questions being asked in regard to the pt's history and recent stressors.   Per Nira Conn, FNP pt meets inpt criteria. No appropriate beds at [facility] per Binnie Rail, RN . TTS to seek placement. Britta Mccreedy, RN has been notified of the recommended disposition.     Diagnosis: Schizophrenia  Past Medical History:  Past Medical History:  Diagnosis Date   Diabetes mellitus without complication (HCC)    GERD (gastroesophageal reflux disease)    Hyperlipidemia    Hypertension    Paranoid schizophrenia ([facility])    Sleep apnea    Uric acid nephrolithiasis     History reviewed. No pertinent surgical history.  Family History: No family history on file.  Social History:  reports that he has been smoking Cigarettes.  He has been smoking about 1.00 pack per day. He has never used  smokeless tobacco. He reports that he does not drink alcohol or use drugs.  Additional Social History:  Alcohol / Drug Use Pain Medications: Pt denies abuse  Prescriptions: Pt denies abuse  Over the Counter: Pt denies abuse  History of alcohol / drug use?:  (UTA)  CIWA: CIWA-Ar BP: 124/85 Pulse Rate: 112 COWS:    PATIENT STRENGTHS: (choose at least two) Financial means General fund of knowledge  Allergies:  Allergies  Allergen Reactions   Cogentin [Benztropine] Other (See Comments)    Per MAR    Penicillins Other (See Comments)    Per MAR - unable to verify patients PCN reaction    Prolixin [Fluphenazine] Other (See Comments)    Per MAR     Home Medications:  (Not in a hospital admission)  OB/GYN Status:  No LMP for male patient.  General Assessment Data Location of Assessment: [facility] TTS Assessment: In system Is this a Tele or Face-to-Face Assessment?: Face-to-Face Is this an Initial Assessment or a Re-assessment for this encounter?: Initial Assessment Marital status: Single Is patient pregnant?: No Pregnancy Status: No Living Arrangements: Other (Comment) (Pt states "I live with Ronald Roman, my lover.") Can pt return to current living arrangement?: Yes Admission Status: Voluntary Is patient capable of signing voluntary admission?: Yes Referral Source: Self/Family/Friend Insurance type: Medicaid     Crisis Care Plan Living Arrangements: Other (Comment) (Pt states "I live with Ronald Roman, my  lover.") Name of Psychiatrist: none reported Name of Therapist: none reported  Education Status Is patient currently in school?: No Highest grade of school patient has completed: pt stated "all of them, just for the exercise."  Risk to self with the past 6 months Suicidal Ideation: No Has patient been a risk to self within the past 6 months prior to admission? : No Suicidal Intent: No Has patient had any suicidal intent within the past 6 months prior to admission? : No Is  patient at risk for suicide?: No Suicidal Plan?: No Has patient had any suicidal plan within the past 6 months prior to admission? : No Access to Means: No What has been your use of drugs/alcohol within the last 12 months?: UTA Previous Attempts/Gestures: No Triggers for Past Attempts: None known Intentional Self Injurious Behavior: None Family Suicide History: No Recent stressful life event(s): Other (Comment) (UTA) Persecutory voices/beliefs?: No Depression: No Substance abuse history and/or treatment for substance abuse?: No Suicide prevention information given to non-admitted patients: Not applicable  Risk to Others within the past 6 months Homicidal Ideation: No Does patient have any lifetime risk of violence toward others beyond the six months prior to admission? : No Thoughts of Harm to Others: No Current Homicidal Intent: No Current Homicidal Plan: No Access to Homicidal Means: No History of harm to others?: No Assessment of Violence: None Noted Does patient have access to weapons?: No Criminal Charges Pending?: No Does patient have a court date: No Is patient on probation?: No  Psychosis Hallucinations: None noted Delusions: Unspecified  Mental Status Report Appearance/Hygiene: Bizarre, In scrubs, Disheveled Eye Contact: Fair Motor Activity: Freedom of movement Speech: Tangential, Word salad, Slurred Level of Consciousness: Alert Mood: Other (Comment) (UTA) Affect: Flat Anxiety Level: Minimal Thought Processes: Circumstantial, Tangential Judgement: Impaired Orientation: Not oriented Obsessive Compulsive Thoughts/Behaviors: None  Cognitive Functioning Concentration: Poor Memory: Unable to Assess IQ: Average Insight: Poor Impulse Control: Poor Appetite:  (UTA) Sleep: Unable to Assess Vegetative Symptoms: Unable to Assess  ADLScreening [facility] Assessment Services) Patient's cognitive ability adequate to safely complete daily activities?: No Patient able  to express need for assistance with ADLs?: Yes Independently performs ADLs?: Yes (appropriate for developmental age)  Prior Inpatient Therapy Prior Inpatient Therapy: Yes Prior Therapy Dates: 2001, 2002, 2005, 2017 Prior Therapy Facilty/Provider(s): [facility], [facility] Reason for Treatment: Schizophrenia   Prior Outpatient Therapy Prior Outpatient Therapy:  (UTA) Does patient have an ACCT team?: Unknown Does patient have Intensive In-House Services?  : Unknown Does patient have [facility] services? : Unknown Does patient have P4CC services?: Unknown  ADL Screening (condition at time of admission) Patient's cognitive ability adequate to safely complete daily activities?: No Is the patient deaf or have difficulty hearing?: No Does the patient have difficulty seeing, even when wearing glasses/contacts?: No Does the patient have difficulty concentrating, remembering, or making decisions?: Yes Patient able to express need for assistance with ADLs?: Yes Does the patient have difficulty dressing or bathing?: No Independently performs ADLs?: Yes (appropriate for developmental age) Does the patient have difficulty walking or climbing stairs?: No Weakness of Legs: None Weakness of Arms/Hands: None  Home Assistive Devices/Equipment Home Assistive Devices/Equipment: None    Abuse/Neglect Assessment (Assessment to be complete while patient is alone) Physical Abuse:  (UTA )     Advance Directives (For Healthcare) Does patient have an advance directive?: No Would patient like information on creating an advanced directive?: No - patient declined information    Additional Information 1:1 In Past 12 Months?:  Yes CIRT Risk: No Elopement Risk: No Does patient have medical clearance?: Yes     Disposition:  Disposition Initial Assessment Completed for this Encounter: Yes Disposition of Patient: Inpatient treatment program Type of inpatient treatment program: Adult  Karolee Ohsquicha R  Duff 03/18/2016 9:02 PM

## 2016-03-18 NOTE — ED Provider Notes (Signed)
[facility]-EMERGENCY DEPT Provider Note   CSN: [id number] Arrival date & time: 03/18/16  1856  By signing my name below, I, Soijett Blue, attest that this documentation has been prepared under the direction and in the presence of Trixie DredgeEmily Tamaira Ciriello, PA-C Electronically Signed: Soijett Blue, ED Scribe. 03/18/16. 7:34 PM.   History   Chief Complaint Chief Complaint  Patient presents with  . Medical Clearance    LEVEL 5 CAVEAT: PSYCHIATRIC DISORDER  HPI Ronald Roman is a 55 y.o. male with a PMHx of paranoid schizophrenia, DM, HTN, who presents to the Emergency Department brought in by [employer], complaining of medical clearance onset today. When pt was asked what brought him into the ED tonight, he stated "I need an IV for about an hour and then I need a cigarette." Pt also states "I need all the help I can get." Police report that the neighbors called the police and he was in the middle of the road naked. When police arrived, they found the pt naked in the house and talking to himself. Police also state patient had water running in the house and told them there was someone in there using the sink but no one else was in the house.  Police got in contact with the pt case manager and they informed the police that the pt would need to be brought into the ED for further evaluation of his symptoms. Pt wasn't given any medications for the relief of his symptoms. Pt denies any pain at this time.    The history is provided by the patient and the police. No language interpreter was used.    Past Medical History:  Diagnosis Date  . Diabetes mellitus without complication (HCC)   . GERD (gastroesophageal reflux disease)   . Hyperlipidemia   . Hypertension   . Paranoid schizophrenia (HCC)   . Sleep apnea   . Uric acid nephrolithiasis     Patient Active Problem List   Diagnosis Date Noted  . Acute psychosis   . Leukocytosis 02/06/2015  . Sleep apnea 02/05/2015  . AKI (acute kidney injury) (HCC) 02/04/2015    . Seizure disorder (HCC) 02/04/2015  . Acute encephalopathy 02/04/2015  . Diabetes mellitus without complication (HCC)   . Hyperlipidemia   . Hypertension   . Paranoid schizophrenia (HCC)     History reviewed. No pertinent surgical history.     Home Medications    Prior to Admission medications   Medication Sig Start Date End Date Taking? Authorizing Provider  albuterol (PROVENTIL HFA;VENTOLIN HFA) 108 (90 BASE) MCG/ACT inhaler Inhale 2 puffs into the lungs every 4 (four) hours as needed for wheezing or shortness of breath. 04/21/15   Hayden Rasmussenavid Mabe, NP  cetirizine (ZYRTEC) 10 MG chewable tablet Chew 1 tablet (10 mg total) by mouth daily. Patient not taking: Reported on 02/15/2016 04/21/15   Hayden Rasmussenavid Mabe, NP  cloZAPine (CLOZARIL) 100 MG tablet Take 1 tablet (100 mg total) by mouth 2 (two) times daily. Patient not taking: Reported on 02/15/2016 03/27/15   Charm RingsJamison Y Lord, NP  divalproex (DEPAKOTE) 250 MG DR tablet Take 1 tablet (250 mg total) by mouth at bedtime. 03/27/15   Charm RingsJamison Y Lord, NP  divalproex (DEPAKOTE) 500 MG DR tablet Take 2 tablets (1,000 mg total) by mouth at bedtime. 03/27/15   Charm RingsJamison Y Lord, NP  insulin glargine (LANTUS) 100 UNIT/ML injection Inject 10 Units into the skin at bedtime.    Historical Provider, MD  lisinopril (PRINIVIL,ZESTRIL) 10 MG tablet Take 10 mg  by mouth daily.     Historical Provider, MD  LORazepam (ATIVAN) 0.5 MG tablet Take 1 tablet (0.5 mg total) by mouth 2 (two) times daily. Patient taking differently: Take 0.5 mg by mouth daily as needed (anxiety).  03/27/15   Charm Rings, NP  metoprolol tartrate (LOPRESSOR) 25 MG tablet Take 25 mg by mouth 2 (two) times daily.    Historical Provider, MD  OVER THE COUNTER MEDICATION "Laxative."    Historical Provider, MD  Pseudoephedrine-Acetaminophen (COLD TABLETS PO) Take by mouth.    Historical Provider, MD  QUEtiapine (SEROQUEL) 100 MG tablet Take 1 tablet (100 mg total) by mouth at bedtime. Patient not taking:  Reported on 03/01/2016 03/27/15   Charm Rings, NP  QUEtiapine (SEROQUEL) 50 MG tablet Take 1 tablet (50 mg total) by mouth daily. Patient not taking: Reported on 03/01/2016 03/27/15   Charm Rings, NP  zolpidem (AMBIEN) 5 MG tablet Take 1 tablet (5 mg total) by mouth at bedtime as needed for sleep. Patient not taking: Reported on 03/01/2016 03/27/15   Charm Rings, NP    Family History No family history on file.  Social History Social History  Substance Use Topics  . Smoking status: Current Every Day Smoker    Packs/day: 1.00    Types: Cigarettes  . Smokeless tobacco: Never Used  . Alcohol use No     Allergies   Cogentin [benztropine]; Penicillins; and Prolixin [fluphenazine]   Review of Systems Review of Systems  Unable to perform ROS: Psychiatric disorder    Physical Exam Updated Vital Signs BP 124/85 (BP Location: Left Arm)   Pulse 112   Temp 98.8 F (37.1 C) (Oral)   Resp 20   SpO2 100%   Physical Exam  Constitutional: He appears well-developed and well-nourished. No distress.  HENT:  Head: Normocephalic and atraumatic.  Neck: Neck supple.  Cardiovascular: Normal rate and regular rhythm.   Pulmonary/Chest: Effort normal and breath sounds normal. No respiratory distress. He has no wheezes. He has no rales.  Abdominal: Soft. He exhibits no distension and no mass. There is no tenderness. There is no rebound and no guarding.  Neurological: He is alert. He exhibits normal muscle tone.  Skin: He is not diaphoretic.  Psychiatric: His speech is tangential. He is slowed. He is inattentive.  Nursing note and vitals reviewed.    ED Treatments / Results  DIAGNOSTIC STUDIES: Oxygen Saturation is 100% on RA, nl by my interpretation.    COORDINATION OF CARE: 7:33 PM Discussed treatment plan with pt at bedside which includes labs, UA, and pt agreed to plan.   Labs (all labs ordered are listed, but only abnormal results are displayed) Labs Reviewed  COMPREHENSIVE  METABOLIC PANEL - Abnormal; Notable for the following:       Result Value   Glucose, Bld 133 (*)    BUN 22 (*)    All other components within normal limits  CBC - Abnormal; Notable for the following:    WBC 14.3 (*)    All other components within normal limits  RAPID URINE DRUG SCREEN, HOSP PERFORMED - Abnormal; Notable for the following:    Benzodiazepines POSITIVE (*)    All other components within normal limits  ETHANOL    Procedures Procedures (including critical care time)  Medications Ordered in ED Medications  LORazepam (ATIVAN) tablet 0-4 mg (not administered)    Followed by  LORazepam (ATIVAN) tablet 0-4 mg (not administered)  LORazepam (ATIVAN) tablet 1 mg (not administered)  acetaminophen (TYLENOL) tablet 650 mg (not administered)  ibuprofen (ADVIL,MOTRIN) tablet 600 mg (not administered)  nicotine (NICODERM CQ - dosed in mg/24 hours) patch 21 mg (not administered)  ondansetron (ZOFRAN) tablet 4 mg (not administered)  alum & mag hydroxide-simeth (MAALOX/MYLANTA) 200-200-20 MG/5ML suspension 30 mL (not administered)     Initial Impression / Assessment and Plan / ED Course  I have reviewed the triage vital signs and the nursing notes.  Pertinent labs that were available during my care of the patient were reviewed by me and considered in my medical decision making (see chart for details).  Clinical Course    Pt with hx schizophrenia, thought by case worker to be off of his medications, brought in by police today after neighbors saw him outside walking around naked.  Pt's behavior and speech is distracted and tangential.  He has been cooperative and is voluntary.  Pt has been seen by TTS and is medically cleared for inpatient treatment.  Pending placement.    Final Clinical Impressions(s) / ED Diagnoses   Final diagnoses:  Schizophrenia, unspecified type (HCC)    New Prescriptions New Prescriptions   No medications on file   I personally performed the  services described in this documentation, which was scribed in my presence. The recorded information has been reviewed and is accurate.    Trixie Dredge, PA-C 03/18/16 2150    Rolland Porter, MD 03/29/16 2020

## 2016-03-18 NOTE — ED Notes (Signed)
Patient alert and unable to state why he is here. Patient talks about children when asked any questions related to him. Patient oriented to room and unit. Patient with safety search completed; no contraband found. Patient in room on bed undressing. Patient asked to keep scrubs on. Patient redressed himself and is lying in bed with eyes closed. Q 15 minute checks in progress and patient remains safe.

## 2016-03-18 NOTE — ED Notes (Signed)
Pt. In burgundy scrubs. Pt. And belongings searched and wanded by security. Pt. Has 1 belongings bag. Pt. Has 1 jacket, 1 blue jean and 1 green sweater. Pt. Belongings locke up at the nurses station in triage.

## 2016-03-19 ENCOUNTER — Encounter (HOSPITAL_COMMUNITY): Payer: Self-pay

## 2016-03-19 ENCOUNTER — Inpatient Hospital Stay (HOSPITAL_COMMUNITY)
Admission: AD | Admit: 2016-03-19 | Discharge: 2016-04-27 | DRG: 885 | Disposition: A | Discharge status: Home / Self Care | Payer: Medicaid Other | Source: Intra-hospital | Attending: Psychiatry | Admitting: Psychiatry

## 2016-03-19 DIAGNOSIS — F329 Major depressive disorder, single episode, unspecified: Secondary | ICD-10-CM | POA: Diagnosis present

## 2016-03-19 DIAGNOSIS — F17210 Nicotine dependence, cigarettes, uncomplicated: Secondary | ICD-10-CM

## 2016-03-19 DIAGNOSIS — F410 Panic disorder [episodic paroxysmal anxiety] without agoraphobia: Secondary | ICD-10-CM | POA: Diagnosis present

## 2016-03-19 DIAGNOSIS — Z79899 Other long term (current) drug therapy: Secondary | ICD-10-CM

## 2016-03-19 DIAGNOSIS — F23 Brief psychotic disorder: Secondary | ICD-10-CM | POA: Diagnosis not present

## 2016-03-19 DIAGNOSIS — Z794 Long term (current) use of insulin: Secondary | ICD-10-CM

## 2016-03-19 DIAGNOSIS — Z888 Allergy status to other drugs, medicaments and biological substances status: Secondary | ICD-10-CM

## 2016-03-19 DIAGNOSIS — Z880 Allergy status to penicillin: Secondary | ICD-10-CM

## 2016-03-19 DIAGNOSIS — Z111 Encounter for screening for respiratory tuberculosis: Secondary | ICD-10-CM

## 2016-03-19 DIAGNOSIS — I10 Essential (primary) hypertension: Secondary | ICD-10-CM | POA: Diagnosis present

## 2016-03-19 DIAGNOSIS — G40909 Epilepsy, unspecified, not intractable, without status epilepticus: Secondary | ICD-10-CM | POA: Diagnosis present

## 2016-03-19 DIAGNOSIS — F200 Paranoid schizophrenia: Secondary | ICD-10-CM | POA: Diagnosis present

## 2016-03-19 DIAGNOSIS — E119 Type 2 diabetes mellitus without complications: Secondary | ICD-10-CM | POA: Diagnosis present

## 2016-03-19 DIAGNOSIS — T424X6A Underdosing of benzodiazepines, initial encounter: Secondary | ICD-10-CM | POA: Diagnosis present

## 2016-03-19 DIAGNOSIS — G4730 Sleep apnea, unspecified: Secondary | ICD-10-CM | POA: Diagnosis present

## 2016-03-19 DIAGNOSIS — Z9119 Patient's noncompliance with other medical treatment and regimen: Secondary | ICD-10-CM

## 2016-03-19 LAB — GLUCOSE, CAPILLARY
GLUCOSE-CAPILLARY: 145 mg/dL — AB (ref 65–99)
Glucose-Capillary: 119 mg/dL — ABNORMAL HIGH (ref 65–99)

## 2016-03-19 LAB — CBG MONITORING, ED: GLUCOSE-CAPILLARY: 110 mg/dL — AB (ref 65–99)

## 2016-03-19 MED ORDER — NICOTINE POLACRILEX 2 MG MT GUM
2.0000 mg | CHEWING_GUM | OROMUCOSAL | Status: DC | PRN
  Administered 2016-04-11 – 2016-04-20 (×4): 2 mg via ORAL
  Filled 2016-03-19 (×3): qty 1

## 2016-03-19 MED ORDER — INSULIN GLARGINE 100 UNIT/ML ~~LOC~~ SOLN
10.0000 [IU] | Freq: Every day | SUBCUTANEOUS | Status: DC
  Administered 2016-03-19 – 2016-03-31 (×12): 10 [IU] via SUBCUTANEOUS

## 2016-03-19 MED ORDER — OLANZAPINE 5 MG PO TBDP
5.0000 mg | ORAL_TABLET | ORAL | Status: DC | PRN
  Administered 2016-03-19: 5 mg via ORAL
  Filled 2016-03-19: qty 1

## 2016-03-19 MED ORDER — QUETIAPINE FUMARATE 50 MG PO TABS
50.0000 mg | ORAL_TABLET | Freq: Every day | ORAL | Status: DC
  Administered 2016-03-19 – 2016-03-21 (×3): 50 mg via ORAL
  Filled 2016-03-19 (×7): qty 1

## 2016-03-19 MED ORDER — AMANTADINE HCL 100 MG PO CAPS
100.0000 mg | ORAL_CAPSULE | Freq: Two times a day (BID) | ORAL | Status: DC
  Administered 2016-03-19 – 2016-04-27 (×77): 100 mg via ORAL
  Filled 2016-03-19 (×85): qty 1

## 2016-03-19 MED ORDER — DIVALPROEX SODIUM 500 MG PO DR TAB
1000.0000 mg | DELAYED_RELEASE_TABLET | Freq: Every day | ORAL | Status: DC
  Administered 2016-03-19 – 2016-03-21 (×3): 1000 mg via ORAL
  Filled 2016-03-19 (×6): qty 2

## 2016-03-19 MED ORDER — CLOZAPINE 25 MG PO TABS: 50.0000 mg | ORAL_TABLET | Freq: Two times a day (BID) | ORAL | Status: DC

## 2016-03-19 MED ORDER — OLANZAPINE 10 MG IM SOLR: 5.0000 mg | Freq: Once | INTRAMUSCULAR | Status: DC | PRN

## 2016-03-19 MED ORDER — LISINOPRIL 10 MG PO TABS
10.0000 mg | ORAL_TABLET | Freq: Every day | ORAL | Status: DC
  Administered 2016-03-19 – 2016-04-20 (×32): 10 mg via ORAL
  Filled 2016-03-19: qty 1
  Filled 2016-03-19: qty 2
  Filled 2016-03-19 (×9): qty 1
  Filled 2016-03-19 (×2): qty 2
  Filled 2016-03-19 (×3): qty 1
  Filled 2016-03-19: qty 2
  Filled 2016-03-19 (×3): qty 1
  Filled 2016-03-19: qty 2
  Filled 2016-03-19 (×18): qty 1

## 2016-03-19 MED ORDER — ACETAMINOPHEN 325 MG PO TABS
650.0000 mg | ORAL_TABLET | Freq: Four times a day (QID) | ORAL | Status: DC | PRN
  Administered 2016-03-21: 650 mg via ORAL
  Filled 2016-03-19: qty 2

## 2016-03-19 MED ORDER — INSULIN ASPART 100 UNIT/ML ~~LOC~~ SOLN
0.0000 [IU] | Freq: Three times a day (TID) | SUBCUTANEOUS | Status: DC
  Administered 2016-03-20 – 2016-03-21 (×3): 1 [IU] via SUBCUTANEOUS
  Administered 2016-03-22: 2 [IU] via SUBCUTANEOUS
  Administered 2016-03-22 – 2016-03-27 (×6): 1 [IU] via SUBCUTANEOUS
  Administered 2016-03-28 (×2): 2 [IU] via SUBCUTANEOUS
  Administered 2016-03-29: 5 [IU] via SUBCUTANEOUS
  Administered 2016-03-29 – 2016-03-30 (×2): 2 [IU] via SUBCUTANEOUS
  Administered 2016-03-30: 5 [IU] via SUBCUTANEOUS
  Administered 2016-03-30 – 2016-03-31 (×3): 2 [IU] via SUBCUTANEOUS
  Administered 2016-03-31 – 2016-04-01 (×3): 3 [IU] via SUBCUTANEOUS

## 2016-03-19 MED ORDER — DIVALPROEX SODIUM 500 MG PO DR TAB: 500.0000 mg | DELAYED_RELEASE_TABLET | Freq: Every day | ORAL | Status: DC

## 2016-03-19 NOTE — Consult Note (Signed)
[place] Psychiatry Consult   Reason for Consult:  Psychiatric Evaluation Referring Physician:  EDP Patient Identification: Ronald Roman MRN:  [id number] Principal Diagnosis: Paranoid schizophrenia [facility]) Diagnosis:   Patient Active Problem List   Diagnosis Date Noted  . Paranoid schizophrenia ([place]) [F20.0]     Priority: Medium  . Acute psychosis [F23]   . Leukocytosis [D72.829] 02/06/2015  . Sleep apnea [G47.30] 02/05/2015  . AKI (acute kidney injury) ([place]) [N17.9] 02/04/2015  . Seizure disorder ([place]) [V37.482] 02/04/2015  . Acute encephalopathy [G93.40] 02/04/2015  . Diabetes mellitus without complication ([place]) [L07.8]   . Hyperlipidemia [E78.5]   . Hypertension [I10]     Total Time spent with patient: 45 minutes  Subjective:   Ronald Roman is a 55 y.o. male patient who states "ain't no more evidence of the facts of life."   Per [facility] Therapeutic Triage assessment, Ronald Roman is an 55 y.o. male who presents to the ED after his neighbors called the police. According to the chart, the pt was in the road naked today and was talking to himself when [employer] arrived to the home. The pt may be non-compliant with his medications according to reports. While speaking with the pt, he reports that he does not know why he came to the hospital and continued speaking in a tangent. Pt stated :I am living with April, she is the love of my life and we are getting married twice this month and you are next." Pt denied S/I and when asked if he has ever experienced H/I, pt stated "let me think about that." Pt then paused for a brief moment and stated "no." Pt appeared disoriented during the assessment as he was distracted by laughing at the television and often stopped mid-sentence and starred blankly at this assessor. Pt also did not know when his birthday was and when asked he stated, "I don't know it was back in the old day, maybe April." Pt denies having a current  therapist and stated "they all disgust me." Pt refused to participate in the assessment and began to ignore the questions being asked in regard to the pt's history and recent stressors.   Evaluation on the unit: Chart and nursing notes reviewed. The patient is seen face-to-face with Dr. Darleene Cleaver. The patient does not recall the events leading up to this admission. He is alert, oriented to person only. He states the date is 06/18/15 and that the president is "Reita Cliche from the [place] [employer]." He was discharged from [facility] at the end of September or early October. His case manager feels that he may be off his medications again. Mr.Frankson has a history of medication noncompliance which results in him decompensating. Today he denies suicidal or homicidal ideation, intent or plan. He appears to be responding to internal stimuli.   Past Psychiatric History: Schizophrenia  Risk to Self: Suicidal Ideation: No Suicidal Intent: No Is patient at risk for suicide?: No Suicidal Plan?: No Access to Means: No What has been your use of drugs/alcohol within the last 12 months?: UTA Triggers for Past Attempts: None known Intentional Self Injurious Behavior: None Risk to Others: Homicidal Ideation: No Thoughts of Harm to Others: No Current Homicidal Intent: No Current Homicidal Plan: No Access to Homicidal Means: No History of harm to others?: No Assessment of Violence: None Noted Does patient have access to weapons?: No Criminal Charges Pending?: No Does patient have a court date: No Prior Inpatient Therapy: Prior Inpatient Therapy: Yes  Prior Therapy Dates: 2001, 2002, 2005, 2017 Prior Therapy Facilty/Provider(s): [facility], [facility] Reason for Treatment: Schizophrenia  Prior Outpatient Therapy: Prior Outpatient Therapy:  (UTA) Does patient have an ACCT team?: Unknown Does patient have Intensive In-House Services?  : Unknown Does patient have [facility] services? : Unknown Does patient have  P4CC services?: Unknown  Past Medical History:  Past Medical History:  Diagnosis Date  . Diabetes mellitus without complication ([place])   . GERD (gastroesophageal reflux disease)   . Hyperlipidemia   . Hypertension   . Paranoid schizophrenia ([place])   . Sleep apnea   . Uric acid nephrolithiasis    History reviewed. No pertinent surgical history. Family History: No family history on file. Family Psychiatric  History: unknown Social History:  History  Alcohol Use No     History  Drug Use No    Social History   Social History  . Marital status: Single    Spouse name: N/A  . Number of children: N/A  . Years of education: N/A   Social History Main Topics  . Smoking status: Current Every Day Smoker    Packs/day: 1.00    Types: Cigarettes  . Smokeless tobacco: Never Used  . Alcohol use No  . Drug use: No  . Sexual activity: Not Asked   Other Topics Concern  . None   Social History Narrative  . None   Additional Social History:    Allergies:   Allergies  Allergen Reactions  . Cogentin [Benztropine] Other (See Comments)    Per MAR   . Penicillins Other (See Comments)    Per MAR - unable to verify patients PCN reaction   . Prolixin [Fluphenazine] Other (See Comments)    Per MAR     Labs:  Results for orders placed or performed during the hospital encounter of 03/18/16 (from the past 48 hour(s))  Comprehensive metabolic panel     Status: Abnormal   Collection Time: 03/18/16  7:40 PM  Result Value Ref Range   Sodium 136 135 - 145 mmol/L   Potassium 4.4 3.5 - 5.1 mmol/L   Chloride 105 101 - 111 mmol/L   CO2 22 22 - 32 mmol/L   Glucose, Bld 133 (H) 65 - 99 mg/dL   BUN 22 (H) 6 - 20 mg/dL   Creatinine, Ser 1.03 0.61 - 1.24 mg/dL   Calcium 9.7 8.9 - 10.3 mg/dL   Total Protein 7.2 6.5 - 8.1 g/dL   Albumin 4.1 3.5 - 5.0 g/dL   AST 25 15 - 41 U/L   ALT 17 17 - 63 U/L   Alkaline Phosphatase 78 38 - 126 U/L   Total Bilirubin 0.4 0.3 - 1.2 mg/dL   GFR calc non Af  Amer >60 >60 mL/min   GFR calc Af Amer >60 >60 mL/min    Comment: (NOTE) The eGFR has been calculated using the CKD EPI equation. This calculation has not been validated in all clinical situations. eGFR's persistently <60 mL/min signify possible Chronic Kidney Disease.    Anion gap 9 5 - 15  Ethanol     Status: None   Collection Time: 03/18/16  7:40 PM  Result Value Ref Range   Alcohol, Ethyl (B) <5 <5 mg/dL    Comment:        LOWEST DETECTABLE LIMIT FOR SERUM ALCOHOL IS 5 mg/dL FOR MEDICAL PURPOSES ONLY   cbc     Status: Abnormal   Collection Time: 03/18/16  7:40 PM  Result Value Ref Range  WBC 14.3 (H) 4.0 - 10.5 K/uL   RBC 5.01 4.22 - 5.81 MIL/uL   Hemoglobin 13.5 13.0 - 17.0 g/dL   HCT 41.0 39.0 - 52.0 %   MCV 81.8 78.0 - 100.0 fL   MCH 26.9 26.0 - 34.0 pg   MCHC 32.9 30.0 - 36.0 g/dL   RDW 13.6 11.5 - 15.5 %   Platelets 309 150 - 400 K/uL  Rapid urine drug screen (hospital performed)     Status: Abnormal   Collection Time: 03/18/16  7:58 PM  Result Value Ref Range   Opiates NONE DETECTED NONE DETECTED   Cocaine NONE DETECTED NONE DETECTED   Benzodiazepines POSITIVE (A) NONE DETECTED   Amphetamines NONE DETECTED NONE DETECTED   Tetrahydrocannabinol NONE DETECTED NONE DETECTED   Barbiturates NONE DETECTED NONE DETECTED    Comment:        DRUG SCREEN FOR MEDICAL PURPOSES ONLY.  IF CONFIRMATION IS NEEDED FOR ANY PURPOSE, NOTIFY LAB WITHIN 5 DAYS.        LOWEST DETECTABLE LIMITS FOR URINE DRUG SCREEN Drug Class       Cutoff (ng/mL) Amphetamine      1000 Barbiturate      200 Benzodiazepine   342 Tricyclics       876 Opiates          300 Cocaine          300 THC              50   CBG monitoring, ED     Status: Abnormal   Collection Time: 03/18/16 10:24 PM  Result Value Ref Range   Glucose-Capillary 127 (H) 65 - 99 mg/dL    Current Facility-Administered Medications  Medication Dose Route Frequency Provider Last Rate Last Dose  . acetaminophen (TYLENOL)  tablet 650 mg  650 mg Oral Q4H PRN Clayton Bibles, PA-C      . albuterol (PROVENTIL HFA;VENTOLIN HFA) 108 (90 Base) MCG/ACT inhaler 2 puff  2 puff Inhalation Q4H PRN Clayton Bibles, PA-C      . alum & mag hydroxide-simeth (MAALOX/MYLANTA) 200-200-20 MG/5ML suspension 30 mL  30 mL Oral PRN Clayton Bibles, PA-C      . divalproex (DEPAKOTE) DR tablet 250 mg  250 mg Oral QHS Emily West, PA-C   250 mg at 03/18/16 2226  . ibuprofen (ADVIL,MOTRIN) tablet 600 mg  600 mg Oral Q8H PRN Clayton Bibles, PA-C      . insulin glargine (LANTUS) injection 10 Units  10 Units Subcutaneous QHS Clayton Bibles, PA-C   10 Units at 03/18/16 2226  . lisinopril (PRINIVIL,ZESTRIL) tablet 10 mg  10 mg Oral Daily Emily West, PA-C   10 mg at 03/19/16 1019  . loratadine (CLARITIN) tablet 10 mg  10 mg Oral Daily Emily West, PA-C   10 mg at 03/19/16 1019  . LORazepam (ATIVAN) tablet 0-4 mg  0-4 mg Oral Q6H Clayton Bibles, PA-C   1 mg at 03/19/16 0641   Followed by  . [START ON 03/21/2016] LORazepam (ATIVAN) tablet 0-4 mg  0-4 mg Oral Q12H Clayton Bibles, PA-C      . LORazepam (ATIVAN) tablet 1 mg  1 mg Oral Q8H PRN Clayton Bibles, PA-C      . nicotine (NICODERM CQ - dosed in mg/24 hours) patch 21 mg  21 mg Transdermal Daily Emily West, PA-C   21 mg at 03/19/16 1019  . ondansetron (ZOFRAN) tablet 4 mg  4 mg Oral Q8H PRN Clayton Bibles, PA-C      .  QUEtiapine (SEROQUEL) tablet 50 mg  50 mg Oral Daily Emily West, PA-C   50 mg at 03/19/16 1019   Current Outpatient Prescriptions  Medication Sig Dispense Refill  . albuterol (PROVENTIL HFA;VENTOLIN HFA) 108 (90 BASE) MCG/ACT inhaler Inhale 2 puffs into the lungs every 4 (four) hours as needed for wheezing or shortness of breath. (Patient not taking: Reported on 03/19/2016) 1 Inhaler 0  . cetirizine (ZYRTEC) 10 MG chewable tablet Chew 1 tablet (10 mg total) by mouth daily. (Patient not taking: Reported on 03/19/2016) 20 tablet 1  . cloZAPine (CLOZARIL) 100 MG tablet Take 1 tablet (100 mg total) by mouth 2 (two) times  daily. (Patient not taking: Reported on 03/19/2016) 60 tablet 0  . divalproex (DEPAKOTE) 250 MG DR tablet Take 1 tablet (250 mg total) by mouth at bedtime. (Patient not taking: Reported on 03/19/2016) 30 tablet 0  . divalproex (DEPAKOTE) 500 MG DR tablet Take 2 tablets (1,000 mg total) by mouth at bedtime. (Patient not taking: Reported on 03/19/2016) 60 tablet 0  . insulin glargine (LANTUS) 100 UNIT/ML injection Inject 10 Units into the skin at bedtime.    Marland Kitchen lisinopril (PRINIVIL,ZESTRIL) 10 MG tablet Take 10 mg by mouth daily.     Marland Kitchen LORazepam (ATIVAN) 0.5 MG tablet Take 1 tablet (0.5 mg total) by mouth 2 (two) times daily. (Patient not taking: Reported on 03/19/2016) 60 tablet 0  . metoprolol tartrate (LOPRESSOR) 25 MG tablet Take 25 mg by mouth 2 (two) times daily.    . QUEtiapine (SEROQUEL) 100 MG tablet Take 1 tablet (100 mg total) by mouth at bedtime. (Patient not taking: Reported on 03/19/2016) 30 tablet 0  . QUEtiapine (SEROQUEL) 50 MG tablet Take 1 tablet (50 mg total) by mouth daily. (Patient not taking: Reported on 03/19/2016) 30 tablet 0  . zolpidem (AMBIEN) 5 MG tablet Take 1 tablet (5 mg total) by mouth at bedtime as needed for sleep. (Patient not taking: Reported on 03/19/2016) 30 tablet 0    Musculoskeletal: Strength & Muscle Tone: within normal limits Gait & Station: unsteady Patient leans: N/A  Psychiatric Specialty Exam: Physical Exam  Nursing note and vitals reviewed.   Review of Systems  Constitutional: Negative.   HENT: Negative.   Eyes: Negative.   Respiratory: Negative.   Cardiovascular: Negative.   Gastrointestinal: Negative.   Genitourinary: Negative.   Musculoskeletal: Negative.   Skin: Negative.   Neurological: Negative.   Endo/Heme/Allergies: Negative.   Psychiatric/Behavioral:       Delusions; appears to responding to internal stimuli    Blood pressure 117/85, pulse 100, temperature 100.7 F (38.2 C), temperature source Oral, resp. rate 20, SpO2 100  %.There is no height or weight on file to calculate BMI.  General Appearance: Disheveled  Eye Contact:  Poor  Speech:  Garbled and Slow  Volume:  Decreased  Mood:  Dysphoric  Affect:  Congruent and Flat  Thought Process:  Disorganized  Orientation:  Other:  Oriented to person  Thought Content:  Delusions  Suicidal Thoughts:  No  Homicidal Thoughts:  No  Memory:  Immediate;   Poor Recent;   Poor  Judgement:  Poor  Insight:  Lacking  Psychomotor Activity:  Decreased  Concentration:  Concentration: Poor and Attention Span: Poor  Recall:  Poor  Fund of Knowledge:  Poor  Language:  Poor  Akathisia:  No  Handed:  Right  AIMS (if indicated):     Assets:  Desire for Improvement Physical Health Resilience  ADL's:  Intact  Cognition:  Impaired,  Mild  Sleep:       Case discussed with Dr. Darleene Cleaver; recommendations are: Treatment Plan Summary: Daily contact with patient to assess and evaluate symptoms and progress in treatment and Medication management  Continue home medications Seroquel 50 mg daily for mood stabilization. Ativan 1 mg by mouth every 8 hours as needed for anxiety, agitation.  Disposition: Recommend psychiatric Inpatient admission when medically cleared.  Serena Colonel, FNP-BC [place] 03/19/2016 11:13 AM  Patient seen face-to-face for psychiatric evaluation, chart reviewed and case discussed with the physician extender and developed treatment plan. Reviewed the information documented and agree with the treatment plan. Corena Pilgrim, MD

## 2016-03-19 NOTE — Progress Notes (Signed)
[facility] Group Notes:  (Nursing/MHT/Case Management/Adjunct)  Date:  03/19/2016  Time:  9:14 PM  Type of Therapy:  Psychoeducational Skills  Participation Level:  Active  Participation Quality:  Resistant  Affect:  Flat  Cognitive:  Disorganized  Insight:  Lacking  Engagement in Group:  Off Topic  Modes of Intervention:  Education  Summary of Progress/Problems: Patient states that he had a good day and that he feels ready for discharge. No additional details were provided about discharge. He jumped from one topic to another and spoke of leaving tonight and that he had an issue with insurance. He was unable to state his relapse prevention strategy for the day.   Hazle CocaGOODMAN, Faysal Fenoglio S 03/19/2016, 9:14 PM

## 2016-03-19 NOTE — Progress Notes (Signed)
Ronald Roman is a 55 year old male being admitted voluntarily to 8[facility] from [facility].  He was brought in to the by the police after Talha's neighbors called because of concerns for his wellbeing.  He was found in the road naked and talking to himself by the police.  He was confused and was very tangential.  He denied SI/HI but had a hard time [facility].  He was noted laughing and talking to himself although he denies hearing voices.  [facility], he continued to deny hearing voices but would laugh at inappropriate times and had noticeable thought blocking.  He was unable to answer questions appropriately.  He does admit that he has HTN, diabetes and trouble breathing at times.  He did say that he tries to eat low calorie foods but was unable to tell writer what medications he takes on a regular basis.  He reports that he is currently living at the shelter with his son but unable to elaborate.  He denies any physical complaints at this time and appears to be in no physical distress.  He is diagnosed with Schizophrenia.  Oriented him to the unit.  Admission paperwork completed and signed.  Belongings searched and secured in locker # 44.  Skin assessment completed and noted scars on the top of his head (well healed) and dry skin on both feet.  Q 15 minute checks initiated for safety.  We will monitor the progress towards his goals.

## 2016-03-19 NOTE — Progress Notes (Signed)
03/19/26 1326:  Pt was preparing to be discharged.  Caroll RancherMarjette Jospeh Mangel, LRT/CTRS

## 2016-03-19 NOTE — BH Assessment (Signed)
B[facility] Assessment Progress Note  Per Thedore MinsMojeed Akintayo, MD, this pt requires psychiatric hospitalization at this time.  Lillia AbedLindsay, RN, [facility]; they will be ready to receive pt at 13:30.  Pt has signed Voluntary Admission and Consent for Treatment, but was unable to sign Consent to Release Information due to disorganized thought.  Signed form [facility].  Pt's nurse, Lincoln MaxinOlivette, has been notified, and agrees to send original paperwork along with pt via [facility], and to call report to [phone].  Doylene Canninghomas Flint Hakeem, MA Triage Specialist [phone]

## 2016-03-19 NOTE — Tx Team (Signed)
Initial Treatment Plan 03/19/2016 4:21 PM Ronald AngstBarry D Mollenkopf [id number]    PATIENT STRESSORS: Financial difficulties Health problems Medication change or noncompliance   PATIENT STRENGTHS: Active sense of humor General fund of knowledge   PATIENT IDENTIFIED PROBLEMS: Psychosis  Non compliance with medication  "I don't want the toilet to overflow"  "I want that damn boy to keep out of my house"               DISCHARGE CRITERIA:  Improved stabilization in mood, thinking, and/or behavior Verbal commitment to aftercare and medication compliance  PRELIMINARY DISCHARGE PLAN: Outpatient therapy Medication management  PATIENT/FAMILY INVOLVEMENT: This treatment plan has been presented to and reviewed with the patient, Ronald Roman.  The patient and family have been given the opportunity to ask questions and make suggestions.  Levin BaconHeather V Jariah Tarkowski, RN 03/19/2016, 4:21 PM

## 2016-03-19 NOTE — Progress Notes (Signed)
EKG COMPLETED AND PUT ON FRONT OF CHART FOR MD REVIEW.

## 2016-03-20 DIAGNOSIS — Z79899 Other long term (current) drug therapy: Secondary | ICD-10-CM

## 2016-03-20 DIAGNOSIS — Z888 Allergy status to other drugs, medicaments and biological substances status: Secondary | ICD-10-CM

## 2016-03-20 DIAGNOSIS — F200 Paranoid schizophrenia: Principal | ICD-10-CM

## 2016-03-20 DIAGNOSIS — Z880 Allergy status to penicillin: Secondary | ICD-10-CM

## 2016-03-20 LAB — CBC WITH DIFFERENTIAL/PLATELET
BASOS ABS: 0 10*3/uL (ref 0.0–0.1)
BASOS ABS: 0 10*3/uL (ref 0.0–0.1)
BASOS PCT: 0 %
Basophils Relative: 0 %
EOS PCT: 2 %
Eosinophils Absolute: 0.2 10*3/uL (ref 0.0–0.7)
Eosinophils Absolute: 0.2 10*3/uL (ref 0.0–0.7)
Eosinophils Relative: 2 %
HCT: 35.7 % — ABNORMAL LOW (ref 39.0–52.0)
HEMATOCRIT: 35.6 % — AB (ref 39.0–52.0)
HEMOGLOBIN: 11.7 g/dL — AB (ref 13.0–17.0)
HEMOGLOBIN: 11.6 g/dL — AB (ref 13.0–17.0)
LYMPHS PCT: 28 %
LYMPHS PCT: 26 %
Lymphs Abs: 2.4 10*3/uL (ref 0.7–4.0)
Lymphs Abs: 2.6 10*3/uL (ref 0.7–4.0)
MCH: 27 pg (ref 26.0–34.0)
MCH: 27.3 pg (ref 26.0–34.0)
MCHC: 32.5 g/dL (ref 30.0–36.0)
MCHC: 32.9 g/dL (ref 30.0–36.0)
MCV: 83.2 fL (ref 78.0–100.0)
MCV: 83.2 fL (ref 78.0–100.0)
MONOS PCT: 10 %
Monocytes Absolute: 1 10*3/uL (ref 0.1–1.0)
Monocytes Absolute: 1.2 10*3/uL — ABNORMAL HIGH (ref 0.1–1.0)
Monocytes Relative: 13 %
NEUTROS ABS: 5.5 10*3/uL (ref 1.7–7.7)
NEUTROS PCT: 60 %
NEUTROS PCT: 59 %
Neutro Abs: 5.4 10*3/uL (ref 1.7–7.7)
PLATELETS: 293 10*3/uL (ref 150–400)
Platelets: 286 10*3/uL (ref 150–400)
RBC: 4.29 MIL/uL (ref 4.22–5.81)
RBC: 4.28 MIL/uL (ref 4.22–5.81)
RDW: 13.9 % (ref 11.5–15.5)
RDW: 14 % (ref 11.5–15.5)
WBC: 9.1 10*3/uL (ref 4.0–10.5)
WBC: 9.2 10*3/uL (ref 4.0–10.5)

## 2016-03-20 LAB — GLUCOSE, CAPILLARY
GLUCOSE-CAPILLARY: 137 mg/dL — AB (ref 65–99)
GLUCOSE-CAPILLARY: 132 mg/dL — AB (ref 65–99)
GLUCOSE-CAPILLARY: 169 mg/dL — AB (ref 65–99)
Glucose-Capillary: 109 mg/dL — ABNORMAL HIGH (ref 65–99)

## 2016-03-20 MED ORDER — OLANZAPINE 5 MG PO TBDP
5.0000 mg | ORAL_TABLET | Freq: Three times a day (TID) | ORAL | Status: DC | PRN
  Administered 2016-03-20 – 2016-03-21 (×2): 5 mg via ORAL
  Filled 2016-03-20 (×2): qty 1

## 2016-03-20 MED ORDER — CLOZAPINE 25 MG PO TABS
25.0000 mg | ORAL_TABLET | ORAL | Status: DC
  Administered 2016-03-20 – 2016-03-22 (×4): 25 mg via ORAL
  Filled 2016-03-20 (×8): qty 1

## 2016-03-20 NOTE — BHH Group Notes (Signed)
B[facility] Group Notes:  (Clinical Social Work)  03/20/2016  11:15-12:00PM  Summary of Progress/Problems:   Today's process group involved patients discussing their feelings related to being hospitalized, as well as benefits they see to being in the hospital.  These were itemized on the whiteboard, and then the group brainstormed specific ways in which they could seek for those same benefits to happen when they discharge and go back home. The patient expressed a primary feeling about being hospitalized is "alright."  He did not talk during group unless called on directly, and was in and out of the room a few times with his sack of possessions.  Type of Therapy:  Group Therapy - Process  Participation Level:  Minimal  Participation Quality:  Inattentive  Affect:  Flat  Cognitive:  Disorganized  Insight:  None  Engagement in Therapy:  Limited  Modes of Intervention:  Exploration, Discussion  Ambrose MantleMareida Grossman-Orr, LCSW 03/20/2016, 12:48 PM

## 2016-03-20 NOTE — Progress Notes (Signed)
D: Pt is very confused, paranoid and delusional; Pt was observed talking to self as three different people. Pt however completely denies any form of depression, anxiety, pain, SI, HI and AVH. Pt is very high elopement risk; has tried run through the door several times. Pt is placed on unit restriction. Pt is a high fall risk. A: Medications offered as prescribed.  Support, encouragement, and safe environment provided.  15-minute safety checks continue. R: Pt was med compliant.  Pt attended group. Safety checks continue.

## 2016-03-20 NOTE — BHH Counselor (Signed)
CSW attempted to complete PSA at this time.  Pt walked from the dayroom to his room, got under the covers and covered his face.  When asked what brought him to the hospital pt states "I don't even know!".  Pt didn't respond to anymore questions.  CSW will attempt PSA when pt is able to answer questions appropriately.

## 2016-03-20 NOTE — [facility] Suicide Risk Assessment (Signed)
[facility]   Nursing information obtained from:  Patient Demographic factors:  Male, Low socioeconomic status, Unemployed Current Mental Status:  NA Loss Factors:  Financial problems / change in socioeconomic status, Decline in physical health Historical Factors:  Family history of mental illness or substance abuse Risk Reduction Factors:  NA  Total Time spent with patient: 20 minutes Principal Problem: Paranoid schizophrenia (HCC) Diagnosis:   Patient Active Problem List   Diagnosis Date Noted  . Acute psychosis [F23]   . Leukocytosis [D72.829] 02/06/2015  . Sleep apnea [G47.30] 02/05/2015  . AKI (acute kidney injury) (HCC) [N17.9] 02/04/2015  . Seizure disorder (HCC) [G40.909] 02/04/2015  . Acute encephalopathy [G93.40] 02/04/2015  . Diabetes mellitus without complication (HCC) [E11.9]   . Hyperlipidemia [E78.5]   . Hypertension [I10]   . Paranoid schizophrenia (HCC) [F20.0]    Subjective Data:  Ronald Roman is a 55 year old man with history of paranoid schizophrenia, DM, HTN, who was found walking naked by neighbors, recommended by his case manager to be brought to the hospital for evaluation. Patient has history of frequent presentation to ED/admission due to his psychotic symptoms.   Patient is a very poor historian and does not elaborate the story.  He states that "I gave keys" when he is asked the reason for admission. He denies taking any medication. When he is asked the reason, he states that "All I have to do is to read in a day area.. Put on such things daily. " He denies  SI, HI, AH, VH. He denies feeling depressed, denies insomnia.  Continued Clinical Symptoms:  Alcohol Use Disorder Identification Test Final Score (AUDIT): 0 The "Alcohol Use Disorders Identification Test", Guidelines for Use in Primary Care, Second Edition.  World [employer]Health [employer] [facility](WHO). Score between 0-7:  no or low risk or alcohol related problems. Score between  8-15:  moderate risk of alcohol related problems. Score between 16-19:  high risk of alcohol related problems. Score 20 or above:  warrants further diagnostic evaluation for alcohol dependence and treatment.   CLINICAL FACTORS:   Schizophrenia:   Paranoid or undifferentiated type   Musculoskeletal: Strength & Muscle Tone: within normal limits Gait & Station: normal Patient leans: N/A  Psychiatric Specialty Exam: agrees with ED eval.  Physical Exam  Nursing note and vitals reviewed. Constitutional: He appears well-developed and well-nourished.  Neurological:  No rigidity, no tremors    Review of Systems  Neurological: Negative for dizziness and tremors.  Psychiatric/Behavioral: Negative for depression, hallucinations, memory loss, substance abuse and suicidal ideas. The patient is not nervous/anxious and does not have insomnia.   All other systems reviewed and are negative.   Blood pressure 100/74, pulse 100, temperature 99.1 F (37.3 C), temperature source Oral, resp. rate 16, height 5' 5.5" (1.664 m), weight 202 lb (91.6 kg), SpO2 100 %.Body mass index is 33.1 kg/m.  General Appearance: Fairly Groomed  Eye Contact:  Poor  Speech:  Blocked  Volume:  Normal  Mood:  (does not answer)  Affect:  Flat  Thought Process:  Descriptions of Associations: Tangential  Orientation:  Full (Time, Place, and Person)  Thought Content:  no paranoia, paucity of thought  Suicidal Thoughts:  No  Homicidal Thoughts:  No  Memory:  Immediate;   difficult to assess due to his current mental state Recent;   difficult to assess due to his current mental state Remote;   difficult to assess due to his current mental state  Judgement:  Poor  Insight:  Lacking  Psychomotor Activity:  Decreased  Concentration:  Concentration: Poor and Attention Span: Poor  Recall:  Poor  Fund of Knowledge:  Poor  Language:  Poor  Akathisia:  No  Handed:  Right  AIMS (if indicated):   no tremors, no rigidity   Assets:  Desire for Improvement  ADL's:  Intact  Cognition:  WNL  Sleep:  Number of Hours: 0.5    COGNITIVE FEATURES THAT CONTRIBUTE TO RISK:  Closed-mindedness    SUICIDE RISK:   Minimal: No identifiable suicidal ideation.  Patients presenting with no risk factors but with morbid ruminations; may be classified as minimal risk based on the severity of the depressive symptoms   PLAN OF CARE: Patient will be admitted to inpatient psychiatric unit for stabilization and safety. Will provide and encourage milieu participation. Provide medication management and maked adjustments as needed.  Will follow daily.  Please see details for H&P.  I certify that inpatient services furnished can reasonably be expected to improve the patient's condition.  Neysa Hottereina Damontay Alred, MD 03/20/2016, 9:46 AM

## 2016-03-20 NOTE — Progress Notes (Signed)
Pt present with a calm and pleasant mood in the unit today. Pt has been observed pacing on the hallway, not  Interacting much with peers and staff. Pt has shown episodes of confusion, at some point talking to self. Pt stated he did have a good sleep last night, good appetite, low energy and good concentration. Pt rates depression at 0, hopelessness at 0, and anxiety at 0. Pt provided with medication as prescribed, vitals and labs monitored throughout the shift. Emotional and support offered. Pt's safety ensured with 15 minute and environmental checks. Pt currently denies SI/HI and A/V hallucinations. Pt verbally agrees to seek staff if SI/HI or A/VH occurs and to consult with staff before acting on any harmful thoughts, will continue to monitor

## 2016-03-20 NOTE — Progress Notes (Signed)
MEDICATION RELATED CONSULT NOTE - FOLLOW UP   Pharmacy Consult for Clozapine   Allergies  Allergen Reactions  . Cogentin [Benztropine] Other (See Comments)    Per MAR   . Penicillins Other (See Comments)    Per MAR - unable to verify patients PCN reaction   . Prolixin [Fluphenazine] Other (See Comments)    [facility]     Patient Measurements: Height: 5' 5.5" (166.4 cm) Weight: 202 lb (91.6 kg) IBW/kg (Calculated) : 62.65   Labs:  Recent Labs  03/18/16 1940  WBC 14.3*  HGB 13.5  HCT 41.0  PLT 309  CREATININE 1.03  ALBUMIN 4.1  PROT 7.2  AST 25  ALT 17  ALKPHOS 78  BILITOT 0.4    Medications:  Scheduled:  . amantadine  100 mg Oral BID  . cloZAPine  50 mg Oral BID  . divalproex  1,000 mg Oral QHS  . insulin aspart  0-9 Units Subcutaneous TID WC  . insulin glargine  10 Units Subcutaneous QHS  . lisinopril  10 mg Oral Daily  . QUEtiapine  50 mg Oral Daily    Assessment/ Plan  Patient states he has not taken clozapine at home, non compliant with medication at this time.  Patient is eligible to receive clozapine per clozapine registry.  Patient will require baseline CBC with dif and weekly CBC with dif if clozapine is to be restarted  Will verify this am with provider and recommend starting dose of 25-50 day 1 then increase by 25 mg/day to target.     Ronald Roman, Ronald Roman 03/20/2016,7:37 AM

## 2016-03-20 NOTE — H&P (Signed)
Psychiatric Admission Assessment Adult  Patient Identification: Ronald Roman MRN:  [id number] Date of Evaluation:  03/20/2016 Chief Complaint:  SCHIZOPHRENIA Principal Diagnosis: Paranoid schizophrenia (HCC) Diagnosis:   Patient Active Problem List   Diagnosis Date Noted  . Acute psychosis [F23]   . Leukocytosis [D72.829] 02/06/2015  . Sleep apnea [G47.30] 02/05/2015  . AKI (acute kidney injury) (HCC) [N17.9] 02/04/2015  . Seizure disorder (HCC) [G40.909] 02/04/2015  . Acute encephalopathy [G93.40] 02/04/2015  . Diabetes mellitus without complication (HCC) [E11.9]   . Hyperlipidemia [E78.5]   . Hypertension [I10]   . Paranoid schizophrenia (HCC) [F20.0]    History of Present Illness:Per tele-assessment note:male who presents to the ED after his neighbors called the police. According to the chart, the pt was in the road naked today and was talking to himself when [employer] arrived to the home. The pt may be non-compliant with his medications according to reports. While speaking with the pt, he reports that he does not know why he came to the hospital and continued speaking in a tangent. Pt stated :I am living with April, she is the love of my life and we are getting married twice this month and you are next." Pt denied S/I and when asked if he has ever experienced H/I, pt stated "let me think about that." Pt then paused for a brief moment and stated "no." Pt appeared disoriented during the assessment as he was distracted by laughing at the television and often stopped mid-sentence and starred blankly at this assessor. Pt also did not know when his birthday was and when asked he stated, "I don't know it was back in the old day, maybe April." Pt denies having a current therapist and stated "they all disgust me." Pt refused to participate in the assessment and began to ignore the questions being asked in regard to the pt's history and recent stressors.   On Evaluation: Ronald Roman is awake,  alert and oriented *3. Seen resting in dayroom interacting with peers.  Denies suicidal or homicidal ideation. Denies auditory or visual hallucination and does not appear to be responding to internal stimuli. Patient affect is flat and guarded. Patient reports he just need to be stable with his medications which is why he is at the hospital. Patient denies depression or depressive symptoms . Support, encouragement and reassurance was provided.   Associated Signs/Symptoms: Depression Symptoms:  difficulty concentrating, anxiety, panic attacks, (Hypo) Manic Symptoms:  Distractibility, Impulsivity, Irritable Mood, Anxiety Symptoms:  Excessive Worry, Social Anxiety, Psychotic Symptoms:  Hallucinations: Auditory Paranoia, PTSD Symptoms: Avoidance:  Foreshortened Future Total Time spent with patient: 30 minutes  Past Psychiatric History: See above  Is the patient at risk to self? Yes.    Has the patient been a risk to self in the past 6 months? Yes.    Has the patient been a risk to self within the distant past? Yes.    Is the patient a risk to others? Yes.    Has the patient been a risk to others in the past 6 months? No.  Has the patient been a risk to others within the distant past? No.   Prior Inpatient Therapy:   Prior Outpatient Therapy:    Alcohol Screening: 1. How often do you have a drink containing alcohol?: Never 9. Have you or someone else been injured as a result of your drinking?: No 10. Has a relative or friend or a doctor or another health worker been concerned about your drinking or suggested  you cut down?: No Alcohol Use Disorder Identification Test Final Score (AUDIT): 0 Brief Intervention: AUDIT score less than 7 or less-screening does not suggest unhealthy drinking-brief intervention not indicated Substance Abuse History in the last 12 months:  No. Consequences of Substance Abuse: NA Previous Psychotropic Medications: YES Psychological Evaluations:YES Past  Medical History:  Past Medical History:  Diagnosis Date  . Diabetes mellitus without complication (HCC)   . GERD (gastroesophageal reflux disease)   . Hyperlipidemia   . Hypertension   . Paranoid schizophrenia (HCC)   . Sleep apnea   . Uric acid nephrolithiasis    History reviewed. No pertinent surgical history. Family History: History reviewed. No pertinent family history. Family Psychiatric  History:  Tobacco Screening: Have you used any form of tobacco in the last 30 days? (Cigarettes, Smokeless Tobacco, Cigars, and/or Pipes): Yes Tobacco use, Select all that apply: 5 or more cigarettes per day Are you interested in Tobacco Cessation Medications?: Yes, will notify MD for an order Counseled patient on smoking cessation including recognizing danger situations, developing coping skills and basic information about quitting provided: Refused/Declined practical counseling Social History:  History  Alcohol Use No     History  Drug Use No    Additional Social History:                           Allergies:   Allergies  Allergen Reactions  . Cogentin [Benztropine] Other (See Comments)    Per MAR   . Penicillins Other (See Comments)    Per MAR - unable to verify patients PCN reaction   . Prolixin [Fluphenazine] Other (See Comments)    Per [facility]    Lab Results:  Results for orders placed or performed during the hospital encounter of 03/19/16 (from the past 48 hour(s))  Glucose, capillary     Status: Abnormal   Collection Time: 03/19/16  5:36 PM  Result Value Ref Range   Glucose-Capillary 119 (H) 65 - 99 mg/dL  Glucose, capillary     Status: Abnormal   Collection Time: 03/19/16  8:51 PM  Result Value Ref Range   Glucose-Capillary 145 (H) 65 - 99 mg/dL  Glucose, capillary     Status: Abnormal   Collection Time: 03/20/16  5:40 AM  Result Value Ref Range   Glucose-Capillary 137 (H) 65 - 99 mg/dL  Glucose, capillary     Status: Abnormal   Collection Time: 03/20/16 11:55  AM  Result Value Ref Range   Glucose-Capillary 109 (H) 65 - 99 mg/dL    Blood Alcohol level:  Lab Results  Component Value Date   ETH <5 03/18/2016   ETH <5 03/01/2016    Metabolic Disorder Labs:  Lab Results  Component Value Date   HGBA1C 7.2 (H) 02/04/2015   MPG 160 02/04/2015   No results found for: PROLACTIN No results found for: CHOL, TRIG, HDL, CHOLHDL, VLDL, LDLCALC  Current Medications: Current Facility-Administered Medications  Medication Dose Route Frequency Provider Last Rate Last Dose  . acetaminophen (TYLENOL) tablet 650 mg  650 mg Oral Q6H PRN Kristeen Mans, NP      . amantadine (SYMMETREL) capsule 100 mg  100 mg Oral BID Laveda Abbe, NP   100 mg at 03/20/16 9604  . cloZAPine (CLOZARIL) tablet 25 mg  25 mg Oral BH-qamhs Saramma Eappen, MD      . divalproex (DEPAKOTE) DR tablet 1,000 mg  1,000 mg Oral QHS Laveda Abbe, NP  1,000 mg at 03/19/16 2115  . insulin aspart (novoLOG) injection 0-9 Units  0-9 Units Subcutaneous TID WC Laveda AbbeLaurie Britton Parks, NP   1 Units at 03/20/16 [phone]  . insulin glargine (LANTUS) injection 10 Units  10 Units Subcutaneous QHS Laveda AbbeLaurie Britton Parks, NP   10 Units at 03/19/16 2118  . lisinopril (PRINIVIL,ZESTRIL) tablet 10 mg  10 mg Oral Daily Kristeen MansFran E Hobson, NP   10 mg at 03/20/16 [id number]  . nicotine polacrilex (NICORETTE) gum 2 mg  2 mg Oral PRN Jomarie LongsSaramma Eappen, MD      . OLANZapine (ZYPREXA) injection 5 mg  5 mg Intramuscular Once PRN Laveda AbbeLaurie Britton Parks, NP      . OLANZapine zydis (ZYPREXA) disintegrating tablet 5 mg  5 mg Oral TID PRN Jackelyn PolingJason A Berry, NP   5 mg at 03/20/16 0433  . QUEtiapine (SEROQUEL) tablet 50 mg  50 mg Oral Daily Kristeen MansFran E Hobson, NP   50 mg at 03/20/16 [id number]   PTA Medications: Prescriptions Prior to Admission  Medication Sig Dispense Refill Last Dose  . albuterol (PROVENTIL HFA;VENTOLIN HFA) 108 (90 BASE) MCG/ACT inhaler Inhale 2 puffs into the lungs every 4 (four) hours as needed for wheezing or shortness of  breath. (Patient not taking: Reported on 03/19/2016) 1 Inhaler 0 Not Taking at Unknown time  . cetirizine (ZYRTEC) 10 MG chewable tablet Chew 1 tablet (10 mg total) by mouth daily. (Patient not taking: Reported on 03/19/2016) 20 tablet 1 Not Taking at Unknown time  . cloZAPine (CLOZARIL) 100 MG tablet Take 1 tablet (100 mg total) by mouth 2 (two) times daily. (Patient not taking: Reported on 03/19/2016) 60 tablet 0 Not Taking at Unknown time  . divalproex (DEPAKOTE) 250 MG DR tablet Take 1 tablet (250 mg total) by mouth at bedtime. (Patient not taking: Reported on 03/19/2016) 30 tablet 0 Not Taking at Unknown time  . divalproex (DEPAKOTE) 500 MG DR tablet Take 2 tablets (1,000 mg total) by mouth at bedtime. (Patient not taking: Reported on 03/19/2016) 60 tablet 0 Not Taking at Unknown time  . insulin glargine (LANTUS) 100 UNIT/ML injection Inject 10 Units into the skin at bedtime.   Not Taking at Unknown time  . lisinopril (PRINIVIL,ZESTRIL) 10 MG tablet Take 10 mg by mouth daily.    Not Taking at Unknown time  . LORazepam (ATIVAN) 0.5 MG tablet Take 1 tablet (0.5 mg total) by mouth 2 (two) times daily. (Patient not taking: Reported on 03/19/2016) 60 tablet 0 Not Taking at Unknown time  . metoprolol tartrate (LOPRESSOR) 25 MG tablet Take 25 mg by mouth 2 (two) times daily.   Not Taking at Unknown time  . QUEtiapine (SEROQUEL) 100 MG tablet Take 1 tablet (100 mg total) by mouth at bedtime. (Patient not taking: Reported on 03/19/2016) 30 tablet 0 Not Taking at Unknown time  . QUEtiapine (SEROQUEL) 50 MG tablet Take 1 tablet (50 mg total) by mouth daily. (Patient not taking: Reported on 03/19/2016) 30 tablet 0 Not Taking at Unknown time  . zolpidem (AMBIEN) 5 MG tablet Take 1 tablet (5 mg total) by mouth at bedtime as needed for sleep. (Patient not taking: Reported on 03/19/2016) 30 tablet 0 Not Taking at Unknown time    Musculoskeletal: Strength & Muscle Tone: within normal limits Gait & Station:  normal Patient leans: N/A  Psychiatric Specialty Exam: Physical Exam  Nursing note and vitals reviewed. Constitutional: He appears well-developed.  Neurological: He is alert.  Skin: Skin is warm and dry.  Psychiatric:  He has a normal mood and affect.    Review of Systems  Psychiatric/Behavioral: Positive for depression and hallucinations. The patient is nervous/anxious.     Blood pressure 100/74, pulse 100, temperature 99.1 F (37.3 C), temperature source Oral, resp. rate 16, height 5' 5.5" (1.664 m), weight 91.6 kg (202 lb), SpO2 100 %.Body mass index is 33.1 kg/m.  General Appearance: Disheveled  Eye Contact:  Fair  Speech:  Clear and Coherent and Slow  Volume:  Decreased  Mood:  Depressed and Dysphoric  Affect:  Depressed and Flat  Thought Process:  Disorganized  Orientation:  Full (Time, Place, and Person)  Thought Content:  Hallucinations: Auditory  Suicidal Thoughts:  No  Homicidal Thoughts:  No  Memory:  Immediate;   Fair Recent;   Fair Remote;   Fair  Judgement:  Poor  Insight:  Lacking  Psychomotor Activity:  Restlessness  Concentration:  Concentration: Fair and Attention Span: Fair  Recall:  [employer]Fair  Fund of Knowledge:  Fair  Language:  Fair  Akathisia:  No  Handed:  Right  AIMS (if indicated):     Assets:  Communication Skills Desire for Improvement Resilience Social Support  ADL's:  Intact  Cognition:  WNL  Sleep:  Number of Hours: 0.5     I agree with current treatment plan on 2017, Patient seen face-to-face for psychiatric evaluation follow-up, chart reviewed and case discussed with the MD Hisada and Pharmacist . Reviewed the information documented and agree with the treatment plan.  Treatment Plan Summary: Daily contact with patient to assess and evaluate symptoms and progress in treatment and Medication management  Continue with Zyprexa 5 mg, Seroquel 50 mg- discontinue , Depakote 1000 mg, Clozaril 25 mg and Symmetrel 100 mg   for mood  stabilization. Continue with Trazodone 100 mg for insomnia Will continue to monitor vitals ,medication compliance and treatment side effects while patient is here.  Reviewed labs: Clozaril level-pending BAL -, UDS - positive  benzodizpines. CSW will start working on disposition.  Patient to participate in therapeutic milieu   Observation Level/Precautions:  15 minute checks  Laboratory:  CBC Chemistry Profile UDS UA  Psychotherapy:  Individual and groups session  Medications:  See above  Consultations:  Psychiatry  Discharge Concerns:  Safety, stabilization, and risk of access to medication and medication stabilization   Estimated LOS: 5-7 day  Other:     Physician Treatment Plan for Primary Diagnosis: Paranoid schizophrenia (HCC) Long Term Goal(s): Improvement in symptoms so as ready for discharge  Short Term Goals: Ability to identify changes in lifestyle to reduce recurrence of condition will improve, Ability to identify and develop effective coping behaviors will improve and Ability to maintain clinical measurements within normal limits will improve  Physician Treatment Plan for Secondary Diagnosis: Principal Problem:   Paranoid schizophrenia (HCC)  Long Term Goal(s): Improvement in symptoms so as ready for discharge  Short Term Goals: Ability to demonstrate self-control will improve, Ability to maintain clinical measurements within normal limits will improve and Compliance with prescribed medications will improve  I certify that inpatient services furnished can reasonably be expected to improve the patient's condition.    Oneta Rackanika N Eithan Beagle, NP 10/28/20171:12 PM

## 2016-03-20 NOTE — Progress Notes (Signed)
MEDICATION RELATED CONSULT NOTE - FOLLOW UP   Pharmacy Consult for Clozapine     Allergies  Allergen Reactions  . Cogentin [Benztropine] Other (See Comments)    Per MAR   . Penicillins Other (See Comments)    Per MAR - unable to verify patients PCN reaction   . Prolixin [Fluphenazine] Other (See Comments)    Per MAR    Labs:  Recent Labs  03/18/16 1940  WBC 14.3*  HGB 13.5  HCT 41.0  PLT 309  CREATININE 1.03  ALBUMIN 4.1  PROT 7.2  AST 25  ALT 17  ALKPHOS 78  BILITOT 0.4    Medications:  Scheduled:  . amantadine  100 mg Oral BID  . cloZAPine  25 mg Oral BH-qamhs  . divalproex  1,000 mg Oral QHS  . insulin aspart  0-9 Units Subcutaneous TID WC  . insulin glargine  10 Units Subcutaneous QHS  . lisinopril  10 mg Oral Daily  . QUEtiapine  50 mg Oral Daily    Assessment/Plan No blood work obtained this am by lab.  Lab will obtain CBC with dif at 6:30 PM 03/20/16. Spoke with NP to change starting dose of clozapine to 25 mg po x 1 today at bedtime and 25 mg bid starting in am after ANC obtained and if results are appropriate for clozapine therapy     Ronald Roman, Ronald Roman 03/20/2016,8:45 AM

## 2016-03-21 LAB — LIPID PANEL
Cholesterol: 124 mg/dL (ref 0–200)
HDL: 34 mg/dL — ABNORMAL LOW (ref >40)
LDL CALC: 21 mg/dL (ref 0–99)
Total CHOL/HDL Ratio: 3.6 RATIO
Triglycerides: 344 mg/dL — ABNORMAL HIGH (ref <150)
VLDL: 69 mg/dL — AB (ref 0–40)

## 2016-03-21 LAB — GLUCOSE, CAPILLARY
GLUCOSE-CAPILLARY: 113 mg/dL — AB (ref 65–99)
GLUCOSE-CAPILLARY: 124 mg/dL — AB (ref 65–99)
GLUCOSE-CAPILLARY: 152 mg/dL — AB (ref 65–99)
Glucose-Capillary: 102 mg/dL — ABNORMAL HIGH (ref 65–99)

## 2016-03-21 MED ORDER — TRAZODONE HCL 50 MG PO TABS
50.0000 mg | ORAL_TABLET | Freq: Every evening | ORAL | Status: DC | PRN
  Administered 2016-03-21 – 2016-03-22 (×3): 50 mg via ORAL
  Filled 2016-03-21 (×3): qty 1

## 2016-03-21 NOTE — Progress Notes (Signed)
[facility]  03/21/2016 3:57 PM Ronald AngstBarry D Roman  MRN:  [id number] Subjective: patient report " I am okay" has he is walking away with clothing in hands. Patient is requesting for his laundry to be washed.   Objective: Ronald AngstBarry D Nelles is awake, alert and continues to be disorganized throughout this discussion. Patient seen pacing from his bedroom to the dayroom. Patient appear to be responding to internal stimuli. Patient is redirectable at time. Patient reports he is medication compliant without mediation side effects. However patient is agreeable with all questions. Support, encouragement and reassurance was provided.   Principal Problem: Paranoid schizophrenia (HCC) Diagnosis:   Patient Active Problem List   Diagnosis Date Noted  . Acute psychosis [F23]   . Leukocytosis [D72.829] 02/06/2015  . Sleep apnea [G47.30] 02/05/2015  . AKI (acute kidney injury) (HCC) [N17.9] 02/04/2015  . Seizure disorder (HCC) [G40.909] 02/04/2015  . Acute encephalopathy [G93.40] 02/04/2015  . Diabetes mellitus without complication (HCC) [E11.9]   . Hyperlipidemia [E78.5]   . Hypertension [I10]   . Paranoid schizophrenia (HCC) [F20.0]    Total Time spent with patient: 30 minutes  Past Psychiatric History:   Past Medical History:  Past Medical History:  Diagnosis Date  . Diabetes mellitus without complication (HCC)   . GERD (gastroesophageal reflux disease)   . Hyperlipidemia   . Hypertension   . Paranoid schizophrenia (HCC)   . Sleep apnea   . Uric acid nephrolithiasis    History reviewed. No pertinent surgical history. Family History: History reviewed. No pertinent family history. Family Psychiatric  History:  Social History:  History  Alcohol Use No     History  Drug Use No    Social History   Social History  . Marital status: Single    Spouse name: N/A  . Number of children: N/A  . Years of education: N/A   Social History Main Topics  . Smoking status: Current Every  Day Smoker    Packs/day: 1.00    Types: Cigarettes  . Smokeless tobacco: Never Used  . Alcohol use No  . Drug use: No  . Sexual activity: Not Currently   Other Topics Concern  . None   Social History Narrative  . None   Additional Social History:                         Sleep: Fair  Appetite:  Fair  Current Medications: Current Facility-Administered Medications  Medication Dose Route Frequency Provider Last Rate Last Dose  . acetaminophen (TYLENOL) tablet 650 mg  650 mg Oral Q6H PRN Kristeen MansFran E Hobson, NP   650 mg at 03/21/16 0329  . amantadine (SYMMETREL) capsule 100 mg  100 mg Oral BID Laveda AbbeLaurie Britton Parks, NP   100 mg at 03/21/16 [id number]  . cloZAPine (CLOZARIL) tablet 25 mg  25 mg Oral BH-qamhs Jomarie LongsSaramma Eappen, MD   25 mg at 03/21/16 0811  . divalproex (DEPAKOTE) DR tablet 1,000 mg  1,000 mg Oral QHS Laveda AbbeLaurie Britton Parks, NP   1,000 mg at 03/20/16 2127  . insulin aspart (novoLOG) injection 0-9 Units  0-9 Units Subcutaneous TID WC Laveda AbbeLaurie Britton Parks, NP   1 Units at 03/20/16 1702  . insulin glargine (LANTUS) injection 10 Units  10 Units Subcutaneous QHS Laveda AbbeLaurie Britton Parks, NP   10 Units at 03/20/16 2126  . lisinopril (PRINIVIL,ZESTRIL) tablet 10 mg  10 mg Oral Daily Kristeen MansFran E Hobson, NP   10 mg at 03/21/16 0811  .  nicotine polacrilex (NICORETTE) gum 2 mg  2 mg Oral PRN Jomarie Longs, MD      . OLANZapine (ZYPREXA) injection 5 mg  5 mg Intramuscular Once PRN Laveda Abbe, NP      . OLANZapine zydis (ZYPREXA) disintegrating tablet 5 mg  5 mg Oral TID PRN Jackelyn Poling, NP   5 mg at 03/21/16 0327  . QUEtiapine (SEROQUEL) tablet 50 mg  50 mg Oral Daily Kristeen Mans, NP   50 mg at 03/21/16 0811  . traZODone (DESYREL) tablet 50 mg  50 mg Oral QHS PRN,MR X 1 Jackelyn Poling, NP   50 mg at 03/21/16 0144    Lab Results:  Results for orders placed or performed during the hospital encounter of 03/19/16 (from the past 48 hour(s))  Glucose, capillary     Status: Abnormal    Collection Time: 03/19/16  5:36 PM  Result Value Ref Range   Glucose-Capillary 119 (H) 65 - 99 mg/dL  Glucose, capillary     Status: Abnormal   Collection Time: 03/19/16  8:51 PM  Result Value Ref Range   Glucose-Capillary 145 (H) 65 - 99 mg/dL  Glucose, capillary     Status: Abnormal   Collection Time: 03/20/16  5:40 AM  Result Value Ref Range   Glucose-Capillary 137 (H) 65 - 99 mg/dL  Glucose, capillary     Status: Abnormal   Collection Time: 03/20/16 11:55 AM  Result Value Ref Range   Glucose-Capillary 109 (H) 65 - 99 mg/dL  Glucose, capillary     Status: Abnormal   Collection Time: 03/20/16  5:00 PM  Result Value Ref Range   Glucose-Capillary 132 (H) 65 - 99 mg/dL  CBC with Differential/Platelet     Status: Abnormal   Collection Time: 03/20/16  6:24 PM  Result Value Ref Range   WBC 9.1 4.0 - 10.5 K/uL   RBC 4.29 4.22 - 5.81 MIL/uL   Hemoglobin 11.6 (L) 13.0 - 17.0 g/dL   HCT 09.8 (L) 11.9 - 14.7 %   MCV 83.2 78.0 - 100.0 fL   MCH 27.0 26.0 - 34.0 pg   MCHC 32.5 30.0 - 36.0 g/dL   RDW 82.9 56.2 - 13.0 %   Platelets 293 150 - 400 K/uL   Neutrophils Relative % 59 %   Neutro Abs 5.4 1.7 - 7.7 K/uL   Lymphocytes Relative 26 %   Lymphs Abs 2.4 0.7 - 4.0 K/uL   Monocytes Relative 13 %   Monocytes Absolute 1.2 (H) 0.1 - 1.0 K/uL   Eosinophils Relative 2 %   Eosinophils Absolute 0.2 0.0 - 0.7 K/uL   Basophils Relative 0 %   Basophils Absolute 0.0 0.0 - 0.1 K/uL    Comment: Performed at [facility]  Lipid panel     Status: Abnormal   Collection Time: 03/20/16  6:24 PM  Result Value Ref Range   Cholesterol 124 0 - 200 mg/dL   Triglycerides 865 (H) <150 mg/dL   HDL 34 (L) >78 mg/dL   Total CHOL/HDL Ratio 3.6 RATIO   VLDL 69 (H) 0 - 40 mg/dL   LDL Cholesterol 21 0 - 99 mg/dL    Comment:        Total Cholesterol/HDL:CHD Risk Coronary Heart Disease Risk Table                     Men   Women  1/2 Average Risk   3.4   3.3  Average  Risk       5.0   4.4  2  X Average Risk   9.6   7.1  3 X Average Risk  23.4   11.0        Use the calculated Patient Ratio above and the CHD Risk Table to determine the patient's CHD Risk.        ATP III CLASSIFICATION (LDL):  <100     mg/dL   Optimal  161-096  mg/dL   Near or Above                    Optimal  130-159  mg/dL   Borderline  045-409  mg/dL   High  >811     mg/dL   Very High Performed at [facility]   CBC with Differential/Platelet     Status: Abnormal   Collection Time: 03/20/16  6:24 PM  Result Value Ref Range   WBC 9.2 4.0 - 10.5 K/uL   RBC 4.28 4.22 - 5.81 MIL/uL   Hemoglobin 11.7 (L) 13.0 - 17.0 g/dL   HCT 91.4 (L) 78.2 - 95.6 %   MCV 83.2 78.0 - 100.0 fL   MCH 27.3 26.0 - 34.0 pg   MCHC 32.9 30.0 - 36.0 g/dL   RDW 21.3 08.6 - 57.8 %   Platelets 286 150 - 400 K/uL   Neutrophils Relative % 60 %   Neutro Abs 5.5 1.7 - 7.7 K/uL   Lymphocytes Relative 28 %   Lymphs Abs 2.6 0.7 - 4.0 K/uL   Monocytes Relative 10 %   Monocytes Absolute 1.0 0.1 - 1.0 K/uL   Eosinophils Relative 2 %   Eosinophils Absolute 0.2 0.0 - 0.7 K/uL   Basophils Relative 0 %   Basophils Absolute 0.0 0.0 - 0.1 K/uL    Comment: Performed at [facility]  Glucose, capillary     Status: Abnormal   Collection Time: 03/20/16  9:06 PM  Result Value Ref Range   Glucose-Capillary 169 (H) 65 - 99 mg/dL  Glucose, capillary     Status: Abnormal   Collection Time: 03/21/16  5:53 AM  Result Value Ref Range   Glucose-Capillary 113 (H) 65 - 99 mg/dL  Glucose, capillary     Status: Abnormal   Collection Time: 03/21/16 11:45 AM  Result Value Ref Range   Glucose-Capillary 102 (H) 65 - 99 mg/dL    Blood Alcohol level:  Lab Results  Component Value Date   ETH <5 03/18/2016   ETH <5 03/01/2016    Metabolic Disorder Labs: Lab Results  Component Value Date   HGBA1C 7.2 (H) 02/04/2015   MPG 160 02/04/2015   No results found for: PROLACTIN Lab Results  Component Value Date   CHOL 124  03/20/2016   TRIG 344 (H) 03/20/2016   HDL 34 (L) 03/20/2016   CHOLHDL 3.6 03/20/2016   VLDL 69 (H) 03/20/2016   LDLCALC 21 03/20/2016    Physical Findings: AIMS: Facial and Oral Movements Muscles of Facial Expression: None, normal Lips and Perioral Area: None, normal Jaw: None, normal Tongue: None, normal,Extremity Movements Upper (arms, wrists, hands, fingers): None, normal Lower (legs, knees, ankles, toes): None, normal, Trunk Movements Neck, shoulders, hips: None, normal, Overall Severity Severity of abnormal movements (highest score from questions above): None, normal Incapacitation due to abnormal movements: None, normal Patient's awareness of abnormal movements (rate only patient's report): No Awareness, Dental Status Current problems with teeth and/or dentures?: No Does patient usually wear  dentures?: No  CIWA:    COWS:     Musculoskeletal: Strength & Muscle Tone: within normal limits Gait & Station: normal Patient leans: N/A  Psychiatric Specialty Exam: Physical Exam  Nursing note and vitals reviewed. Constitutional: He is oriented to person, place, and time. He appears well-developed and well-nourished.  Neurological: He is alert and oriented to person, place, and time.  Skin: Skin is warm and dry.  Psychiatric: He has a normal mood and affect. His behavior is normal.    Review of Systems  Psychiatric/Behavioral: Positive for hallucinations. The patient is nervous/anxious.     Blood pressure (!) 162/53, pulse 91, temperature 97.6 F (36.4 C), temperature source Oral, resp. rate 16, height 5' 5.5" (1.664 m), weight 91.6 kg (202 lb), SpO2 100 %.Body mass index is 33.1 kg/m.  General Appearance: Disheveled  Eye Contact:  Fair  Speech:  Clear and Coherent and Pressured  Volume:  Normal  Mood:  Dysphoric and Irritable  Affect:  Flat and Restricted  Thought Process:  Disorganized  Orientation:  Full (Time, Place, and Person)  Thought Content:  Hallucinations:  Auditory, Rumination and Tangential  Suicidal Thoughts:  No  Homicidal Thoughts:  No  Memory:  Immediate;   Fair Recent;   Fair  Judgement:  Impaired  Insight:  Shallow  Psychomotor Activity:  Restlessness  Concentration:  Concentration: Fair and Attention Span: Poor  Recall:  Poor  Fund of Knowledge:  Poor  Language:  Fair  Akathisia:  No  Handed:  Right  AIMS (if indicated):     Assets:  Desire for Improvement Resilience  ADL's:  Intact  Cognition:  WNL  Sleep:  Number of Hours: 0.25     I agree with current treatment plan on 03/21/2016, Patient seen face-to-face for psychiatric evaluation follow-up, chart reviewed. Reviewed the information documented and agree with the treatment plan.  Treatment Plan Summary: Daily contact with patient to assess and evaluate symptoms and progress in treatment and Medication management   Continue with Zyprexa 5 mg, Seroquel 50 mg- discontinue , Depakote 1000 mg, Clozaril 25 mg and Symmetrel 100 mg   for mood stabilization. Continue with Trazodone 100 mg for insomnia Will continue to monitor vitals ,medication compliance and treatment side effects while patient is here.  Reviewed labs: Clozaril level-pending BAL -, UDS - positive  benzodizpines. CSW will start working on disposition.  Patient to participate in therapeutic milieu  Oneta Rackanika N Jsaon Yoo, NP 03/21/2016, 3:57 PM

## 2016-03-21 NOTE — [facility] Suicide Risk Assessment (Signed)
[facility] INPATIENT:  Family/Significant Other Suicide Prevention Education  Suicide Prevention Education:  Patient Refusal for Family/Significant Other Suicide Prevention Education: The patient Ronald Roman has refused to provide written consent for family/significant other to be provided Family/Significant Other Suicide Prevention Education during admission and/or prior to discharge.  Physician notified.  Carloyn JaegerMareida J Grossman-Orr 03/21/2016, 5:43 PM
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
## 2016-03-21 NOTE — [facility] Group Notes (Signed)
[facility] Group Notes:  (Clinical Social Work)  03/21/2016  11:00AM-12:00PM  Summary of Progress/Problems:  The main focus of today's process group was to listen to a variety of genres of music and to identify that different types of music provoke different responses.  The patient then was able to identify personally what was soothing for them, as well as energizing, as well as how patient can personally use this knowledge in sleep habits, with depression, and with other symptoms.  The patient expressed at the beginning of group the overall feeling of "relaxed" and was in and out of the room multiple times.  He was initially drowsy, but eventually started singing and dancing in his seat, even smiling a little bit.  He was malodorous.  Type of Therapy:  Music Therapy   Participation Level:  Active  Participation Quality:  Attentive and Inattentive, alternating  Affect:  Flat  Cognitive:  Disorganized, Delusional  Insight:  Improving  Engagement in Therapy:  Improving  Modes of Intervention:   Activity, Exploration  Ambrose MantleMareida Grossman-Orr, LCSW 03/21/2016

## 2016-03-21 NOTE — Progress Notes (Signed)
D: Pt continues to be very confused, paranoid and delusional; Pt continues to talk and laugh inappropriately. Pt denies any form of depression, anxiety, pain, SI, HI and AVH. Pt can however be easily redirected. A: Medications offered as prescribed.  Support, encouragement, and safe environment provided.  15-minute safety checks continue. R: Pt was med compliant.  Pt attended group. Safety checks continue.

## 2016-03-21 NOTE — Progress Notes (Signed)
D: Pt alert and oriented to self and place. Presents anxious, confused, disorganized and preoccupied about leaving "I've been d/c, I need to leave" on approach. Observed pacing in hall with his belongings in brown paper bag, talking to self with inappropriate laughter at intervals. Pt left group early this AM, stated "they're calling me to go home, I have to go now" and left. Denies SI, HI, AVH and pain at this time.  A: Emotional support and availability provided to pt. All medications administered with verbal education. Writer encouraged pt to attend unit groups and comply with current treatment regimen. Q15 minutes safety checks maintained on unit without outburst, unit restriction remains effective.  R: Pt has been verbally redirectable. Remains preoccupied but cooperative with care and CBG monitoring. Compliant with medications as ordered. Denies adverse drug reactions thus far. Tolerates all PO intake well. Safety maintained on unit.

## 2016-03-21 NOTE — Progress Notes (Signed)
Patient attended nursing psychoeducational group on healthy support systems but left early due to auditory hallucinations.

## 2016-03-22 LAB — GLUCOSE, CAPILLARY
GLUCOSE-CAPILLARY: 138 mg/dL — AB (ref 65–99)
Glucose-Capillary: 97 mg/dL (ref 65–99)
Glucose-Capillary: 174 mg/dL — ABNORMAL HIGH (ref 65–99)

## 2016-03-22 LAB — HEMOGLOBIN A1C
HEMOGLOBIN A1C: 8 % — AB (ref 4.8–5.6)
Mean Plasma Glucose: 183 mg/dL

## 2016-03-22 LAB — CLOZAPINE (CLOZARIL)
CLOZAPINE LVL: NOT DETECTED ng/mL (ref 350–650)
NorClozapine: NOT DETECTED ng/mL
Total(Cloz+Norcloz): UNDETERMINED ng/mL

## 2016-03-22 LAB — PROLACTIN: Prolactin: 36.6 ng/mL — ABNORMAL HIGH (ref 4.0–15.2)

## 2016-03-22 MED ORDER — OLANZAPINE 5 MG PO TABS
5.0000 mg | ORAL_TABLET | Freq: Three times a day (TID) | ORAL | Status: DC | PRN
  Administered 2016-03-24 – 2016-04-01 (×5): 5 mg via ORAL
  Filled 2016-03-22 (×5): qty 2

## 2016-03-22 MED ORDER — TRAZODONE HCL 100 MG PO TABS
125.0000 mg | ORAL_TABLET | Freq: Every day | ORAL | Status: DC
  Administered 2016-03-22: 125 mg via ORAL
  Filled 2016-03-22 (×3): qty 1

## 2016-03-22 MED ORDER — DIVALPROEX SODIUM ER 500 MG PO TB24
1000.0000 mg | ORAL_TABLET | Freq: Every day | ORAL | Status: DC
  Administered 2016-03-22 – 2016-03-24 (×3): 1000 mg via ORAL
  Filled 2016-03-22 (×4): qty 2

## 2016-03-22 MED ORDER — CLOZAPINE 25 MG PO TABS
50.0000 mg | ORAL_TABLET | Freq: Every day | ORAL | Status: DC
  Administered 2016-03-22 – 2016-03-23 (×2): 50 mg via ORAL
  Filled 2016-03-22 (×3): qty 2

## 2016-03-22 MED ORDER — OLANZAPINE 10 MG IM SOLR: 5.0000 mg | Freq: Three times a day (TID) | INTRAMUSCULAR | Status: DC | PRN

## 2016-03-22 MED ORDER — CLOZAPINE 25 MG PO TABS
25.0000 mg | ORAL_TABLET | Freq: Every day | ORAL | Status: DC
  Administered 2016-03-23 – 2016-03-24 (×2): 25 mg via ORAL
  Filled 2016-03-22 (×3): qty 1

## 2016-03-22 NOTE — Progress Notes (Addendum)
Patient ID: Ronald AngstBarry D Roman, male   DOB: [birth year], 55 y.o.   MRN: [id number] D: Client visible on the unit, reports "terrific, fantastic day" Client tangential but pleasant, smiling. Client reports "life is divine, when it's all mine" A: Clinical research associateWriter provided emotional support, reviewed medications, administered as ordered. Staff will monitor q1315min for safety. R: Client is safe on the unit.

## 2016-03-22 NOTE — Progress Notes (Signed)
D: Pt continues to be very confused, paranoid and delusional; Pt continues to talk to self and laugh inappropriately. Pt denies any form of depression, anxiety, pain, SI, HI and AVH. Pt continues to be an elopement risk; Pt walks around with a brown paper bag containing all his belongings and as tried to walk out a few times. Pt can however be easily redirected. A: Medications offered as prescribed.  Support, encouragement, and safe environment provided.  15-minute safety checks continue. R: Pt was med compliant.  Pt attended group. Safety checks continue.

## 2016-03-22 NOTE — [facility] Group Notes (Signed)
[facility] LCSW Group Therapy  03/22/2016 1:15 pm  Type of Therapy: Process Group Therapy  Participation Level:  Active  Participation Quality:  Appropriate  Affect:  Flat  Cognitive:  Oriented  Insight:  Improving  Engagement in Group:  Limited  Engagement in Therapy:  Limited  Modes of Intervention:  Activity, Clarification, Education, Problem-solving and Support  Summary of Progress/Problems: Today's group addressed the issue of overcoming obstacles.  Patients were asked to identify their biggest obstacle post d/c that stands in the way of their on-going success, and then problem solve as to how to manage this. In and out of group multiple times-carrying his bag of belongings everywhere.  No meaningful contribution to discussion.  Ronald Roman, Ronald Roman 03/22/2016   3:56 PM

## 2016-03-22 NOTE — Progress Notes (Signed)
DAR NOTE: Patient presents with anxious affect and depressed mood.  Denies pain, auditory and visual hallucinations.  Rates depression at 0, hopelessness at 0, and anxiety at 0.  Described energy level as hyper and concentration as good.  Maintained on routine safety checks.  Medications given as prescribed.  Support and encouragement offered as needed.  Attended group and participated.  States goal for today is "discharge"  Patient remained on unit restriction due to elopement risk. Patient observed carrying his brown paper bag and asking to be discharged.  Patient redirected few times.

## 2016-03-22 NOTE — Progress Notes (Signed)
Ronald Roman requested to speak to chaplain after seeing chaplain speak with another patient.  Chaplain met with Ronald Roman in [place].  [name] stated "what that guy told you was a lie."  Proceeded to speak about a work situation.  Appeared generally confused and paranoid.  Calm affect throughout encounter and thanked chaplain when finished explaining.    [place], [place]

## 2016-03-22 NOTE — Progress Notes (Signed)
[facility] Progress Note  03/22/2016 1:04 PM Osvaldo AngstBarry D Leber  MRN:  [id number] Subjective: Patient reports " I am fine ."    Objective: Osvaldo AngstBarry D Hudon is a 55 year old AA male with history ofparanoid schizophrenia, DM, HTN,who was found walking naked by neighbors, recommended by his case manager to be brought to the hospital for evaluation.  Patient seen and chart reviewed.Discussed patient with treatment team.  Patient today is seen as walking around with a brown paper bag full of his clothes. When asked why he is carrying it all around - pt reports that others are stealing from him and that he does not want to leave it in his room. Of note: Patient has no room mates at this time and other patients are not allowed to his room ." This was discussed with patient , however he continues to be delusional , paranoid. Pt with thought blocking , is disorganized , continues to need medication readjustment . Pt per staff - continues to need a lot of redirection on the unit.      Principal Problem: Paranoid schizophrenia (HCC) Diagnosis:   Patient Active Problem List   Diagnosis Date Noted  . Acute psychosis [F23]   . Leukocytosis [D72.829] 02/06/2015  . Sleep apnea [G47.30] 02/05/2015  . AKI (acute kidney injury) (HCC) [N17.9] 02/04/2015  . Seizure disorder (HCC) [G40.909] 02/04/2015  . Acute encephalopathy [G93.40] 02/04/2015  . Diabetes mellitus without complication (HCC) [E11.9]   . Hyperlipidemia [E78.5]   . Hypertension [I10]   . Paranoid schizophrenia (HCC) [F20.0]    Total Time spent with patient: 30 minutes  Past Psychiatric History: Please see H&P.   Past Medical History:  Past Medical History:  Diagnosis Date  . Diabetes mellitus without complication (HCC)   . GERD (gastroesophageal reflux disease)   . Hyperlipidemia   . Hypertension   . Paranoid schizophrenia (HCC)   . Sleep apnea   . Uric acid nephrolithiasis    History reviewed. No pertinent surgical  history. Family History: Patient denies Family Psychiatric  History: Pt denies Social History: Patient reports [place] , is single , has children who does not live with him , reports he gets food stamps . History  Alcohol Use No     History  Drug Use No    Social History   Social History  . Marital status: Single    Spouse name: N/A  . Number of children: N/A  . Years of education: N/A   Social History Main Topics  . Smoking status: Current Every Day Smoker    Packs/day: 1.00    Types: Cigarettes  . Smokeless tobacco: Never Used  . Alcohol use No  . Drug use: No  . Sexual activity: Not Currently   Other Topics Concern  . None   Social History Narrative  . None   Additional Social History:                         Sleep: noted as poor , pt unable to comment  Appetite:  Fair  Current Medications: Current Facility-Administered Medications  Medication Dose Route Frequency Provider Last Rate Last Dose  . acetaminophen (TYLENOL) tablet 650 mg  650 mg Oral Q6H PRN Kristeen MansFran E Hobson, NP   650 mg at 03/21/16 0329  . amantadine (SYMMETREL) capsule 100 mg  100 mg Oral BID Laveda AbbeLaurie Britton Parks, NP   100 mg at 03/22/16 0816  . [START ON  03/23/2016] cloZAPine (CLOZARIL) tablet 25 mg  25 mg Oral Daily Eleyna Brugh, MD      . cloZAPine (CLOZARIL) tablet 50 mg  50 mg Oral QHS Makylie Rivere, MD      . divalproex (DEPAKOTE) DR tablet 1,000 mg  1,000 mg Oral QHS Laveda Abbe, NP   1,000 mg at 03/21/16 2125  . insulin aspart (novoLOG) injection 0-9 Units  0-9 Units Subcutaneous TID WC Laveda Abbe, NP   1 Units at 03/22/16 [phone]  . insulin glargine (LANTUS) injection 10 Units  10 Units Subcutaneous QHS Laveda Abbe, NP   10 Units at 03/21/16 2125  . lisinopril (PRINIVIL,ZESTRIL) tablet 10 mg  10 mg Oral Daily Kristeen Mans, NP   10 mg at 03/22/16 0816  . nicotine polacrilex (NICORETTE) gum 2 mg  2 mg Oral PRN Jomarie Longs, MD      .  OLANZapine (ZYPREXA) tablet 5 mg  5 mg Oral TID PRN Jomarie Longs, MD       Or  . OLANZapine (ZYPREXA) injection 5 mg  5 mg Intramuscular TID PRN Jomarie Longs, MD      . traZODone (DESYREL) tablet 50 mg  50 mg Oral QHS PRN,MR X 1 Jackelyn Poling, NP   50 mg at 03/22/16 0124    Lab Results:  Results for orders placed or performed during the hospital encounter of 03/19/16 (from the past 48 hour(s))  Glucose, capillary     Status: Abnormal   Collection Time: 03/20/16  5:00 PM  Result Value Ref Range   Glucose-Capillary 132 (H) 65 - 99 mg/dL  CBC with Differential/Platelet     Status: Abnormal   Collection Time: 03/20/16  6:24 PM  Result Value Ref Range   WBC 9.1 4.0 - 10.5 K/uL   RBC 4.29 4.22 - 5.81 MIL/uL   Hemoglobin 11.6 (L) 13.0 - 17.0 g/dL   HCT 96.0 (L) 45.4 - 09.8 %   MCV 83.2 78.0 - 100.0 fL   MCH 27.0 26.0 - 34.0 pg   MCHC 32.5 30.0 - 36.0 g/dL   RDW 11.9 14.7 - 82.9 %   Platelets 293 150 - 400 K/uL   Neutrophils Relative % 59 %   Neutro Abs 5.4 1.7 - 7.7 K/uL   Lymphocytes Relative 26 %   Lymphs Abs 2.4 0.7 - 4.0 K/uL   Monocytes Relative 13 %   Monocytes Absolute 1.2 (H) 0.1 - 1.0 K/uL   Eosinophils Relative 2 %   Eosinophils Absolute 0.2 0.0 - 0.7 K/uL   Basophils Relative 0 %   Basophils Absolute 0.0 0.0 - 0.1 K/uL    Comment: Performed at [facility]  Hemoglobin A1c     Status: Abnormal   Collection Time: 03/20/16  6:24 PM  Result Value Ref Range   Hgb A1c MFr Bld 8.0 (H) 4.8 - 5.6 %    Comment: (NOTE)         Pre-diabetes: 5.7 - 6.4         Diabetes: >6.4         Glycemic control for adults with diabetes: <7.0    Mean Plasma Glucose 183 mg/dL    Comment: (NOTE) Performed At: [facility] [address] [place], [name] [id number] Mila Homer MD HQ:[id number] Performed at [facility]   Prolactin     Status: Abnormal   Collection Time: 03/20/16  6:24 PM  Result Value Ref Range   Prolactin 36.6 (H) 4.0  -  15.2 ng/mL    Comment: (NOTE) Performed At: [facility] [address] [place], [name] [id number] Mila Homer MD WU:[id number] Performed at [facility]   Lipid panel     Status: Abnormal   Collection Time: 03/20/16  6:24 PM  Result Value Ref Range   Cholesterol 124 0 - 200 mg/dL   Triglycerides 956 (H) <150 mg/dL   HDL 34 (L) >21 mg/dL   Total CHOL/HDL Ratio 3.6 RATIO   VLDL 69 (H) 0 - 40 mg/dL   LDL Cholesterol 21 0 - 99 mg/dL    Comment:        Total Cholesterol/HDL:CHD Risk Coronary Heart Disease Risk Table                     Men   Women  1/2 Average Risk   3.4   3.3  Average Risk       5.0   4.4  2 X Average Risk   9.6   7.1  3 X Average Risk  23.4   11.0        Use the calculated Patient Ratio above and the CHD Risk Table to determine the patient's CHD Risk.        ATP III CLASSIFICATION (LDL):  <100     mg/dL   Optimal  308-657  mg/dL   Near or Above                    Optimal  130-159  mg/dL   Borderline  846-962  mg/dL   High  >952     mg/dL   Very High Performed at [facility]   CBC with Differential/Platelet     Status: Abnormal   Collection Time: 03/20/16  6:24 PM  Result Value Ref Range   WBC 9.2 4.0 - 10.5 K/uL   RBC 4.28 4.22 - 5.81 MIL/uL   Hemoglobin 11.7 (L) 13.0 - 17.0 g/dL   HCT 84.1 (L) 32.4 - 40.1 %   MCV 83.2 78.0 - 100.0 fL   MCH 27.3 26.0 - 34.0 pg   MCHC 32.9 30.0 - 36.0 g/dL   RDW 02.7 25.3 - 66.4 %   Platelets 286 150 - 400 K/uL   Neutrophils Relative % 60 %   Neutro Abs 5.5 1.7 - 7.7 K/uL   Lymphocytes Relative 28 %   Lymphs Abs 2.6 0.7 - 4.0 K/uL   Monocytes Relative 10 %   Monocytes Absolute 1.0 0.1 - 1.0 K/uL   Eosinophils Relative 2 %   Eosinophils Absolute 0.2 0.0 - 0.7 K/uL   Basophils Relative 0 %   Basophils Absolute 0.0 0.0 - 0.1 K/uL    Comment: Performed at [facility]  Glucose, capillary     Status: Abnormal   Collection Time: 03/20/16  9:06 PM  Result  Value Ref Range   Glucose-Capillary 169 (H) 65 - 99 mg/dL  Glucose, capillary     Status: Abnormal   Collection Time: 03/21/16  5:53 AM  Result Value Ref Range   Glucose-Capillary 113 (H) 65 - 99 mg/dL  Glucose, capillary     Status: Abnormal   Collection Time: 03/21/16 11:45 AM  Result Value Ref Range   Glucose-Capillary 102 (H) 65 - 99 mg/dL  Glucose, capillary     Status: Abnormal   Collection Time: 03/21/16  5:13 PM  Result Value Ref Range   Glucose-Capillary 124 (H) 65 - 99 mg/dL  Glucose,  capillary     Status: Abnormal   Collection Time: 03/21/16  9:13 PM  Result Value Ref Range   Glucose-Capillary 152 (H) 65 - 99 mg/dL  Glucose, capillary     Status: Abnormal   Collection Time: 03/22/16  6:07 AM  Result Value Ref Range   Glucose-Capillary 138 (H) 65 - 99 mg/dL  Glucose, capillary     Status: None   Collection Time: 03/22/16 12:11 PM  Result Value Ref Range   Glucose-Capillary 97 65 - 99 mg/dL    Blood Alcohol level:  Lab Results  Component Value Date   ETH <5 03/18/2016   ETH <5 03/01/2016    Metabolic Disorder Labs: Lab Results  Component Value Date   HGBA1C 8.0 (H) 03/20/2016   MPG 183 03/20/2016   MPG 160 02/04/2015   Lab Results  Component Value Date   PROLACTIN 36.6 (H) 03/20/2016   Lab Results  Component Value Date   CHOL 124 03/20/2016   TRIG 344 (H) 03/20/2016   HDL 34 (L) 03/20/2016   CHOLHDL 3.6 03/20/2016   VLDL 69 (H) 03/20/2016   LDLCALC 21 03/20/2016    Physical Findings: AIMS: Facial and Oral Movements Muscles of Facial Expression: None, normal Lips and Perioral Area: None, normal Jaw: None, normal Tongue: None, normal,Extremity Movements Upper (arms, wrists, hands, fingers): None, normal Lower (legs, knees, ankles, toes): None, normal, Trunk Movements Neck, shoulders, hips: None, normal, Overall Severity Severity of abnormal movements (highest score from questions above): None, normal Incapacitation due to abnormal movements:  None, normal Patient's awareness of abnormal movements (rate only patient's report): No Awareness, Dental Status Current problems with teeth and/or dentures?: No Does patient usually wear dentures?: No  CIWA:    COWS:     Musculoskeletal: Strength & Muscle Tone: within normal limits Gait & Station: normal Patient leans: N/A  Psychiatric Specialty Exam: Physical Exam  Nursing note and vitals reviewed. Constitutional: He is oriented to person, place, and time. He appears well-developed and well-nourished.  Neurological: He is alert and oriented to person, place, and time.  Skin: Skin is warm and dry.  Psychiatric: He has a normal mood and affect. His behavior is normal.    Review of Systems  Psychiatric/Behavioral: Positive for depression and hallucinations. The patient is nervous/anxious and has insomnia.   All other systems reviewed and are negative.   Blood pressure 123/70, pulse 91, temperature 97.7 F (36.5 C), temperature source Oral, resp. rate 18, height 5' 5.5" (1.664 m), weight 91.6 kg (202 lb), SpO2 100 %.Body mass index is 33.1 kg/m.  General Appearance: Disheveled  Eye Contact:  Fair  Speech:  Blocked and Slow  Volume:  Decreased  Mood:  Anxious, Depressed, Dysphoric and Irritable  Affect:  Restricted  Thought Process:  Disorganized, Irrelevant and Descriptions of Associations: Circumstantial  Orientation:  Full (Time, Place, and Person)  Thought Content:  Delusions, Hallucinations: Auditory, Paranoid Ideation, Rumination and Tangential  Suicidal Thoughts:  No but is delusional and disorganized  Homicidal Thoughts:  No  Memory:  Immediate;   Fair Recent;   Fair Remote;   Fair  Judgement:  Impaired  Insight:  Shallow  Psychomotor Activity:  Restlessness  Concentration:  Concentration: Fair and Attention Span: Poor  Recall:  Poor  Fund of Knowledge:  Poor  Language:  Fair  Akathisia:  No  Handed:  Right  AIMS (if indicated):     Assets:  Desire for  Improvement Resilience  ADL's:  Intact  Cognition:  WNL  Sleep:  Number of Hours: 2     Treatment Plan Summary: ROBERT SPERL is a 55 year old AA male with history ofparanoid schizophrenia, DM, HTN,who was found walking naked by neighbors, recommended by his case manager to be brought to the hospital for evaluation.  Patient today seen as walking around with a paper bag full of belongings , is psychotic , disorganized , is on UR due to elopement risk, will readjust medications .  Daily contact with patient to assess and evaluate symptoms and progress in treatment and Medication management   Continue  Clozaril 25 mg po daily and increase Clozaril to 50 mg po qhs for psychosis. CBC with diff -03/20/16 - wbc  9.1,ANC 5.4 . Pharmacy consult for clozaril therapy. Will continue Depakote , change to ER 1000 mg po qhs for mood sx. Depakote level  - 03/25/16. Will increase Trazodone to 125 mg po qhs  for insomnia Will continue to monitor vitals ,medication compliance and treatment side effects while patient is here.  Reviewed labs: Clozaril level-pending .EKG - qtc - wnl .Will get TSH, hba1c, pl as well as lipid panel. CSW will continue  working on disposition.  Patient to participate in therapeutic milieu  Allenmichael Mcpartlin, MD 03/22/2016, 1:04 PM

## 2016-03-22 NOTE — Tx Team (Signed)
Interdisciplinary Treatment and Diagnostic Plan Update  03/22/2016 Time of Session: 8:34 AM  HARUN BRUMLEY MRN: [id number]  Principal Diagnosis: Paranoid schizophrenia [facility])  Secondary Diagnoses: Principal Problem:   Paranoid schizophrenia ([place])   Current Medications:  Current Facility-Administered Medications  Medication Dose Route Frequency Provider Last Rate Last Dose  . acetaminophen (TYLENOL) tablet 650 mg  650 mg Oral Q6H PRN Lurena Nida, NP   650 mg at 03/21/16 0329  . amantadine (SYMMETREL) capsule 100 mg  100 mg Oral BID Ethelene Hal, NP   100 mg at 03/22/16 0816  . cloZAPine (CLOZARIL) tablet 25 mg  25 mg Oral BH-qamhs Saramma Eappen, MD   25 mg at 03/22/16 0815  . divalproex (DEPAKOTE) DR tablet 1,000 mg  1,000 mg Oral QHS Ethelene Hal, NP   1,000 mg at 03/21/16 2125  . insulin aspart (novoLOG) injection 0-9 Units  0-9 Units Subcutaneous TID WC Ethelene Hal, NP   1 Units at 03/22/16 [phone]  . insulin glargine (LANTUS) injection 10 Units  10 Units Subcutaneous QHS Ethelene Hal, NP   10 Units at 03/21/16 2125  . lisinopril (PRINIVIL,ZESTRIL) tablet 10 mg  10 mg Oral Daily Lurena Nida, NP   10 mg at 03/22/16 0816  . nicotine polacrilex (NICORETTE) gum 2 mg  2 mg Oral PRN Ursula Alert, MD      . OLANZapine (ZYPREXA) injection 5 mg  5 mg Intramuscular Once PRN Ethelene Hal, NP      . OLANZapine zydis (ZYPREXA) disintegrating tablet 5 mg  5 mg Oral TID PRN Rozetta Nunnery, NP   5 mg at 03/21/16 0327  . traZODone (DESYREL) tablet 50 mg  50 mg Oral QHS PRN,MR X 1 Rozetta Nunnery, NP   50 mg at 03/22/16 0124    PTA Medications: Prescriptions Prior to Admission  Medication Sig Dispense Refill Last Dose  . albuterol (PROVENTIL HFA;VENTOLIN HFA) 108 (90 BASE) MCG/ACT inhaler Inhale 2 puffs into the lungs every 4 (four) hours as needed for wheezing or shortness of breath. (Patient not taking: Reported on 03/19/2016) 1 Inhaler 0 Not Taking at  Unknown time  . cetirizine (ZYRTEC) 10 MG chewable tablet Chew 1 tablet (10 mg total) by mouth daily. (Patient not taking: Reported on 03/19/2016) 20 tablet 1 Not Taking at Unknown time  . cloZAPine (CLOZARIL) 100 MG tablet Take 1 tablet (100 mg total) by mouth 2 (two) times daily. (Patient not taking: Reported on 03/19/2016) 60 tablet 0 Not Taking at Unknown time  . divalproex (DEPAKOTE) 250 MG DR tablet Take 1 tablet (250 mg total) by mouth at bedtime. (Patient not taking: Reported on 03/19/2016) 30 tablet 0 Not Taking at Unknown time  . divalproex (DEPAKOTE) 500 MG DR tablet Take 2 tablets (1,000 mg total) by mouth at bedtime. (Patient not taking: Reported on 03/19/2016) 60 tablet 0 Not Taking at Unknown time  . insulin glargine (LANTUS) 100 UNIT/ML injection Inject 10 Units into the skin at bedtime.   Not Taking at Unknown time  . lisinopril (PRINIVIL,ZESTRIL) 10 MG tablet Take 10 mg by mouth daily.    Not Taking at Unknown time  . LORazepam (ATIVAN) 0.5 MG tablet Take 1 tablet (0.5 mg total) by mouth 2 (two) times daily. (Patient not taking: Reported on 03/19/2016) 60 tablet 0 Not Taking at Unknown time  . metoprolol tartrate (LOPRESSOR) 25 MG tablet Take 25 mg by mouth 2 (two) times daily.   Not Taking at Unknown time  .  QUEtiapine (SEROQUEL) 100 MG tablet Take 1 tablet (100 mg total) by mouth at bedtime. (Patient not taking: Reported on 03/19/2016) 30 tablet 0 Not Taking at Unknown time  . QUEtiapine (SEROQUEL) 50 MG tablet Take 1 tablet (50 mg total) by mouth daily. (Patient not taking: Reported on 03/19/2016) 30 tablet 0 Not Taking at Unknown time  . zolpidem (AMBIEN) 5 MG tablet Take 1 tablet (5 mg total) by mouth at bedtime as needed for sleep. (Patient not taking: Reported on 03/19/2016) 30 tablet 0 Not Taking at Unknown time    Treatment Modalities: Medication Management, Group therapy, Case management,  1 to 1 session with clinician, Psychoeducation, Recreational therapy.   Physician  Treatment Plan for Primary Diagnosis: Paranoid schizophrenia ([place]) Long Term Goal(s): Improvement in symptoms so as ready for discharge  Short Term Goals: Ability to identify changes in lifestyle to reduce recurrence of condition will improve  Medication Management: Evaluate patient's response, side effects, and tolerance of medication regimen.  Therapeutic Interventions: 1 to 1 sessions, Unit Group sessions and Medication administration.  Evaluation of Outcomes: Not Met  Physician Treatment Plan for Secondary Diagnosis: Principal Problem:   Paranoid schizophrenia ([place])   Long Term Goal(s): Improvement in symptoms so as ready for discharge  Short Term Goals: Ability to identify and develop effective coping behaviors will improve  Medication Management: Evaluate patient's response, side effects, and tolerance of medication regimen.  Therapeutic Interventions: 1 to 1 sessions, Unit Group sessions and Medication administration.  Evaluation of Outcomes: Not Met   RN Treatment Plan for Primary Diagnosis: Paranoid schizophrenia ([place]) Long Term Goal(s): Knowledge of disease and therapeutic regimen to maintain health will improve  Short Term Goals: Compliance with prescribed medications will improve  Medication Management: RN will administer medications as ordered by provider, will assess and evaluate patient's response and provide education to patient for prescribed medication. RN will report any adverse and/or side effects to prescribing provider.  Therapeutic Interventions: 1 on 1 counseling sessions, Psychoeducation, Medication administration, Evaluate responses to treatment, Monitor vital signs and CBGs as ordered, Perform/monitor CIWA, COWS, AIMS and Fall Risk screenings as ordered, Perform wound care treatments as ordered.  Evaluation of Outcomes: Progressing   LCSW Treatment Plan for Primary Diagnosis: Paranoid schizophrenia ([place]) Long Term Goal(s): Safe transition to  appropriate next level of care at discharge, Engage patient in therapeutic group addressing interpersonal concerns.  Short Term Goals: Engage patient in aftercare planning with referrals and resources  Therapeutic Interventions: Assess for all discharge needs, 1 to 1 time with Social worker, Explore available resources and support systems, Assess for adequacy in community support network, Educate family and significant other(s) on suicide prevention, Complete Psychosocial Assessment, Interpersonal group therapy.  Evaluation of Outcomes: Progressing  CSW left message for Melody Jefferey Pica transition coordinator at [phone].   Progress in Treatment: Attending groups: Yes Participating in groups: Yes Taking medication as prescribed: Yes Toleration medication: Yes, no side effects reported at this time Family/Significant other contact made: Yes  See above Patient understands diagnosis: No  Limited insight Discussing patient identified problems/goals with staff: Yes Medical problems stabilized or resolved: Yes Denies suicidal/homicidal ideation: Yes Issues/concerns per patient self-inventory: None Other: N/A  New problem(s) identified: None identified at this time.   New Short Term/Long Term Goal(s): None identified at this time.   Discharge Plan or Barriers: Likley return home, follow up outpt  Reason for Continuation of Hospitalization: Disorganization Medication stabilization   Estimated Length of Stay: 3-5 days  Attendees: Patient:  03/22/2016  8:34 AM  Physician: Ursula Alert, MD 03/22/2016  8:34 AM  Nursing: Hoy Register, RN 03/22/2016  8:34 AM  RN Care Manager: Lars Pinks, RN 03/22/2016  8:34 AM  Social Worker: Ripley Fraise 03/22/2016  8:34 AM  Recreational Therapist: Marjette  03/22/2016  8:34 AM  Other: Norberto Sorenson 03/22/2016  8:34 AM  Other:  03/22/2016  8:34 AM    Scribe for Treatment Team:  Roque Lias 03/22/2016 8:34 AM

## 2016-03-22 NOTE — Progress Notes (Signed)
Did not attend group 

## 2016-03-22 NOTE — Progress Notes (Signed)
Recreation Therapy Notes  INPATIENT RECREATION THERAPY ASSESSMENT  Patient Details Name: Ronald Roman MRN: [id number] DOB: 07-Aug-1960 Today's Date: 03/22/2016  Patient Stressors: Pt refused to answer stating "I have been talking since I been here".  Coping Skills:    (Refused to answer)  Personal Challenges:  (Refused to answer)  Leisure Interests (2+):   (Refused to answer)  Awareness of Community Resources:   (Refused to answer)  Community Resources:  Refused to answer  Current Use: Refused to answer  If no, Barriers?: Refused to answer  Patient Strengths:   (Refused to answer)  Patient Identified Areas of Improvement:   (Refused to answer)  Current Recreation Participation:   (Refused to answer)  Patient Goal for Hospitalization:   (Refused to answer)  [place]   (Refused to answer)  [place]   (Refused to answer)   Current SI (including self-harm):   (Refused to answer)  Current HI:   (Refused to answer)  Consent to Intern Participation:  (Refused to answer)   Ronald Roman, LRT/CTRS  Ronald Roman, Ronald Roman 03/22/2016, 12:46 PM

## 2016-03-22 NOTE — Progress Notes (Signed)
Recreation Therapy Notes  Date: 03/22/16 Time: 1000 Location: 500 Hall Dayroom  Group Topic: Wellness  Goal Area(s) Addresses:  Patient will define components of whole wellness. Patient will verbalize benefit of whole wellness.  Behavioral Response: Drowsy  Intervention: Dry erase board, dry erase marker, eraser  Activity: Wellness Pictionary.  LRT introduced the concept and components of wellness.  LRT had a can with various words pertaining to wellness on them.  Patients were to pull strips of paper from a can with a word pertaining to wellness on it.  Patients were to draw what was written on their paper.  The rest of the group tries to guess what the person is drawing.  The person that guesses the correct answer, goes next.  Education: Wellness, Building control surveyorDischarge Planning.   Education Outcome: Acknowledges education/In group clarification offered/Needs additional education.   Clinical Observations/Feedback: Pt arrived late.  Pt did not participate.  Pt appeared drowsy and was dosing off during group.     Caroll RancherMarjette Emmery Seiler, LRT/CTRS        Caroll RancherLindsay, Rhett Mutschler A 03/22/2016 11:49 AM

## 2016-03-23 LAB — LIPID PANEL
Cholesterol: 145 mg/dL (ref 0–200)
HDL: 30 mg/dL — ABNORMAL LOW (ref >40)
LDL CALC: 86 mg/dL (ref 0–99)
Total CHOL/HDL Ratio: 4.8 RATIO
Triglycerides: 144 mg/dL (ref <150)
VLDL: 29 mg/dL (ref 0–40)

## 2016-03-23 LAB — GLUCOSE, CAPILLARY
GLUCOSE-CAPILLARY: 132 mg/dL — AB (ref 65–99)
GLUCOSE-CAPILLARY: 90 mg/dL (ref 65–99)
GLUCOSE-CAPILLARY: 143 mg/dL — AB (ref 65–99)
Glucose-Capillary: 109 mg/dL — ABNORMAL HIGH (ref 65–99)

## 2016-03-23 LAB — TSH: TSH: 1.772 u[IU]/mL (ref 0.350–4.500)

## 2016-03-23 MED ORDER — TRAZODONE HCL 150 MG PO TABS
150.0000 mg | ORAL_TABLET | Freq: Every day | ORAL | Status: DC
  Administered 2016-03-23: 150 mg via ORAL
  Filled 2016-03-23 (×2): qty 1

## 2016-03-23 NOTE — Progress Notes (Signed)
Adult Psychoeducational Group Note  Date:  03/23/2016 Time:  8:48 PM  Group Topic/Focus:  Wrap-Up Group:   The focus of this group is to help patients review their daily goal of treatment and discuss progress on daily workbooks.   Participation Level:  Active  Participation Quality:  Appropriate  Affect:  Appropriate  Cognitive:  Appropriate  Insight: Appropriate  Engagement in Group:  Engaged  Modes of Intervention:  Discussion  Additional Comments The patient expressed that he rates today 10.The patient also said that he attended all groups. Octavio Mannshigpen, Kathline Banbury Lee 03/23/2016, 8:48 PM

## 2016-03-23 NOTE — Progress Notes (Signed)
DAR NOTE: Patient remained hypervigilant and preoccupied with getting discharge.  Patient redirected several times without difficulty.  Denies pain, auditory and visual hallucinations.  Rates depression at 10, hopelessness at 0, and anxiety at 10.  Described energy level as hyper and concentration as good.  Maintained on routine safety checks.  Medications given as prescribed.  Support and encouragement offered as needed.  Attended group and participated.  Patient remained preoccupied with discharge.  Patient is unit restricted due to elopement risk.

## 2016-03-23 NOTE — Progress Notes (Signed)
Patient ID: Ronald AngstBarry D Roman, male   DOB: [birth year], 55 y.o.   MRN: [id number] D: Client visible on the unit, walking up and down the halls. Client reports "I want my clothes washed" "I need a laxative" client reports no BM, last couple of days. A: Writer provided emotional support, reviewed medications, administered as ordered. Staff will monitor q4515min for safety. R: Client is safe on unit, attended group.

## 2016-03-23 NOTE — Progress Notes (Signed)
[facility] MD Progress Note  03/23/2016 1:44 PM Ronald Roman  MRN:  [id number] Subjective: Patient reports " I am ok."     Objective: Ronald Roman is a 55 year old AA male with history ofparanoid schizophrenia, DM, HTN,who was found walking naked by neighbors, recommended by his case manager to be brought to the hospital for evaluation.  Patient seen and chart reviewed.Discussed patient with treatment team.  Patient today is seen as walking around with a brown paper bag full of his clothes.  Pt continues to be delusional - paranoid that others are stealing from him , is disorganized, has inappropriate laughter on and off. Pt responds to questions are in monosyllables - per staff- he continues to need a lot of redirection on the unit.  Pt today agreed to referral to [facility] - the message was conveyed to the CSW- Ronald Roman . Support , reassurance provided.       Principal Problem: Paranoid schizophrenia (HCC) Diagnosis:   Patient Active Problem List   Diagnosis Date Noted  . Acute psychosis [F23]   . Leukocytosis [D72.829] 02/06/2015  . Sleep apnea [G47.30] 02/05/2015  . AKI (acute kidney injury) (HCC) [N17.9] 02/04/2015  . Seizure disorder (HCC) [G40.909] 02/04/2015  . Acute encephalopathy [G93.40] 02/04/2015  . Diabetes mellitus without complication (HCC) [E11.9]   . Hyperlipidemia [E78.5]   . Hypertension [I10]   . Paranoid schizophrenia (HCC) [F20.0]    Total Time spent with patient: 25 minutes  Past Psychiatric History: Please see H&P.   Past Medical History:  Past Medical History:  Diagnosis Date  . Diabetes mellitus without complication (HCC)   . GERD (gastroesophageal reflux disease)   . Hyperlipidemia   . Hypertension   . Paranoid schizophrenia (HCC)   . Sleep apnea   . Uric acid nephrolithiasis    History reviewed. No pertinent surgical history. Family History: Patient denies Family Psychiatric  History: Pt denies Social History: Patient reports he  [place] in [place] , is single , has children who does not live with him , reports he gets food stamps . History  Alcohol Use No     History  Drug Use No    Social History   Social History  . Marital status: Single    Spouse name: N/A  . Number of children: N/A  . Years of education: N/A   Social History Main Topics  . Smoking status: Current Every Day Smoker    Packs/day: 1.00    Types: Cigarettes  . Smokeless tobacco: Never Used  . Alcohol use No  . Drug use: No  . Sexual activity: Not Currently   Other Topics Concern  . None   Social History Narrative  . None   Additional Social History:                         Sleep: noted as poor , pt unable to comment  Appetite:  Fair  Current Medications: Current Facility-Administered Medications  Medication Dose Route Frequency Provider Last Rate Last Dose  . acetaminophen (TYLENOL) tablet 650 mg  650 mg Oral Q6H PRN Kristeen Mans, NP   650 mg at 03/21/16 0329  . amantadine (SYMMETREL) capsule 100 mg  100 mg Oral BID Laveda Abbe, NP   100 mg at 03/23/16 4098  . cloZAPine (CLOZARIL) tablet 25 mg  25 mg Oral Daily Jomarie Longs, MD   25 mg at 03/23/16 0803  . cloZAPine (CLOZARIL) tablet 50  mg  50 mg Oral QHS Jomarie Longs, MD   50 mg at 03/22/16 2134  . divalproex (DEPAKOTE ER) 24 hr tablet 1,000 mg  1,000 mg Oral QHS Jomarie Longs, MD   1,000 mg at 03/22/16 2134  . insulin aspart (novoLOG) injection 0-9 Units  0-9 Units Subcutaneous TID WC Laveda Abbe, NP   1 Units at 03/23/16 [phone]  . insulin glargine (LANTUS) injection 10 Units  10 Units Subcutaneous QHS Laveda Abbe, NP   10 Units at 03/22/16 2134  . lisinopril (PRINIVIL,ZESTRIL) tablet 10 mg  10 mg Oral Daily Kristeen Mans, NP   10 mg at 03/23/16 0803  . nicotine polacrilex (NICORETTE) gum 2 mg  2 mg Oral PRN Alaysia Lightle, MD      . OLANZapine (ZYPREXA) tablet 5 mg  5 mg Oral TID PRN Jomarie Longs, MD       Or  . OLANZapine  (ZYPREXA) injection 5 mg  5 mg Intramuscular TID PRN Jomarie Longs, MD      . traZODone (DESYREL) tablet 125 mg  125 mg Oral QHS Jomarie Longs, MD   125 mg at 03/22/16 2134    Lab Results:  Results for orders placed or performed during the hospital encounter of 03/19/16 (from the past 48 hour(s))  Glucose, capillary     Status: Abnormal   Collection Time: 03/21/16  5:13 PM  Result Value Ref Range   Glucose-Capillary 124 (H) 65 - 99 mg/dL  Glucose, capillary     Status: Abnormal   Collection Time: 03/21/16  9:13 PM  Result Value Ref Range   Glucose-Capillary 152 (H) 65 - 99 mg/dL  Glucose, capillary     Status: Abnormal   Collection Time: 03/22/16  6:07 AM  Result Value Ref Range   Glucose-Capillary 138 (H) 65 - 99 mg/dL  Glucose, capillary     Status: None   Collection Time: 03/22/16 12:11 PM  Result Value Ref Range   Glucose-Capillary 97 65 - 99 mg/dL  Glucose, capillary     Status: Abnormal   Collection Time: 03/22/16  4:54 PM  Result Value Ref Range   Glucose-Capillary 174 (H) 65 - 99 mg/dL  TSH     Status: None   Collection Time: 03/23/16  6:19 AM  Result Value Ref Range   TSH 1.772 0.350 - 4.500 uIU/mL    Comment: Performed by a 3rd Generation assay with a functional sensitivity of <=0.01 uIU/mL. Performed at [facility]   Lipid panel     Status: Abnormal   Collection Time: 03/23/16  6:19 AM  Result Value Ref Range   Cholesterol 145 0 - 200 mg/dL   Triglycerides 440 <347 mg/dL   HDL 30 (L) >42 mg/dL   Total CHOL/HDL Ratio 4.8 RATIO   VLDL 29 0 - 40 mg/dL   LDL Cholesterol 86 0 - 99 mg/dL    Comment:        Total Cholesterol/HDL:CHD Risk Coronary Heart Disease Risk Table                     Men   Women  1/2 Average Risk   3.4   3.3  Average Risk       5.0   4.4  2 X Average Risk   9.6   7.1  3 X Average Risk  23.4   11.0        Use the calculated Patient Ratio above and the CHD Risk Table  to determine the patient's CHD Risk.        ATP  III CLASSIFICATION (LDL):  <100     mg/dL   Optimal  [id number]  mg/dL   Near or Above                    Optimal  130-159  mg/dL   Borderline  [id number]  mg/dL   High  >811>190     mg/dL   Very High Performed at [facility]   Glucose, capillary     Status: Abnormal   Collection Time: 03/23/16  6:43 AM  Result Value Ref Range   Glucose-Capillary 132 (H) 65 - 99 mg/dL  Glucose, capillary     Status: Abnormal   Collection Time: 03/23/16 11:46 AM  Result Value Ref Range   Glucose-Capillary 109 (H) 65 - 99 mg/dL   Comment 1 Notify RN    Comment 2 Document in Chart     Blood Alcohol level:  Lab Results  Component Value Date   ETH <5 03/18/2016   ETH <5 03/01/2016    Metabolic Disorder Labs: Lab Results  Component Value Date   HGBA1C 8.0 (H) 03/20/2016   MPG 183 03/20/2016   MPG 160 02/04/2015   Lab Results  Component Value Date   PROLACTIN 36.6 (H) 03/20/2016   Lab Results  Component Value Date   CHOL 145 03/23/2016   TRIG 144 03/23/2016   HDL 30 (L) 03/23/2016   CHOLHDL 4.8 03/23/2016   VLDL 29 03/23/2016   LDLCALC 86 03/23/2016   LDLCALC 21 03/20/2016    Physical Findings: AIMS: Facial and Oral Movements Muscles of Facial Expression: None, normal Lips and Perioral Area: None, normal Jaw: None, normal Tongue: None, normal,Extremity Movements Upper (arms, wrists, hands, fingers): None, normal Lower (legs, knees, ankles, toes): None, normal, Trunk Movements Neck, shoulders, hips: None, normal, Overall Severity Severity of abnormal movements (highest score from questions above): None, normal Incapacitation due to abnormal movements: None, normal Patient's awareness of abnormal movements (rate only patient's report): No Awareness, Dental Status Current problems with teeth and/or dentures?: No Does patient usually wear dentures?: No  CIWA:    COWS:     Musculoskeletal: Strength & Muscle Tone: within normal limits Gait & Station: normal Patient leans:  N/A  Psychiatric Specialty Exam: Physical Exam  Nursing note and vitals reviewed. Constitutional: He is oriented to person, place, and time. He appears well-developed and well-nourished.  Neurological: He is alert and oriented to person, place, and time.  Skin: Skin is warm and dry.  Psychiatric: He has a normal mood and affect. His behavior is normal.    Review of Systems  Psychiatric/Behavioral: Positive for depression and hallucinations. The patient is nervous/anxious and has insomnia.   All other systems reviewed and are negative.   Blood pressure (!) 147/89, pulse 91, temperature 97.7 F (36.5 C), temperature source Oral, resp. rate 18, height 5' 5.5" (1.664 m), weight 91.6 kg (202 lb), SpO2 100 %.Body mass index is 33.1 kg/m.  General Appearance: Disheveled  Eye Contact:  Fair  Speech:  Blocked and Slow  Volume:  Decreased  Mood:  Anxious, Depressed and Dysphoric  Affect:  Inappropriate smiles to self often  Thought Process:  Disorganized, Irrelevant and Descriptions of Associations: Circumstantial  Orientation:  Full (Time, Place, and Person)  Thought Content:  Delusions, Hallucinations: Auditory, Paranoid Ideation, Rumination and Tangential some improvement  Suicidal Thoughts:  No but is delusional and disorganized  Homicidal Thoughts:  No  Memory:  Immediate;   Fair Recent;   Fair Remote;   Fair  Judgement:  Impaired  Insight:  Shallow  Psychomotor Activity:  Restlessness  Concentration:  Concentration: Fair and Attention Span: Poor  Recall:  Poor  Fund of Knowledge:  Poor  Language:  Fair  Akathisia:  No  Handed:  Right  AIMS (if indicated):     Assets:  Desire for Improvement Resilience  ADL's:  Intact  Cognition:  WNL  Sleep:  Number of Hours: 3.5     Treatment Plan Summary: Osvaldo AngstBarry D Widrig is a 55 year old AA male with history ofparanoid schizophrenia, DM, HTN,who was found walking naked by neighbors, recommended by his case manager to be brought to  the hospital for evaluation.  Patient today seen as walking around with a paper bag full of belongings , is psychotic , disorganized , is on UR due to elopement risk, will readjust medications .  Daily contact with patient to assess and evaluate symptoms and progress in treatment and Medication management   Continue  Clozaril 25 mg po daily and  Clozaril  50 mg po qhs for psychosis. CBC with diff -03/20/16 - wbc  9.1,ANC 5.4 . Pharmacy consult for clozaril therapy. Will continue Depakote , change to ER 1000 mg po qhs for mood sx. Depakote level  - 03/25/16. Will increase Trazodone to 150 mg po qhs  for insomnia Will continue to monitor vitals ,medication compliance and treatment side effects while patient is here.  Reviewed labs: Clozaril level-pending .EKG - qtc - wnl, TSH- wnl , hba1c- pending , pending pl ,  lipid panel HDL - low - diet control recommendations to be provided when patient is more stable. CSW will continue  working on disposition. [facility]. Patient to participate in therapeutic milieu  Rilie Glanz, MD 03/23/2016, 1:44 PM

## 2016-03-23 NOTE — [facility] Group Notes (Signed)
[facility] LCSW Group Therapy  03/23/2016 , 1:22 PM   Type of Therapy:  Group Therapy  Participation Level:  Active  Participation Quality:  Attentive  Affect:  Appropriate  Cognitive:  Alert  Insight:  Improving  Engagement in Therapy:  Engaged  Modes of Intervention:  Discussion, Exploration and Socialization  Summary of Progress/Problems: Today's group focused on the term Diagnosis.  Participants were asked to define the term, and then pronounce whether it is a negative, positive or neutral term.  In and out multiple times.  At one point took all contents out of 2 paperbags, then put them back in again, which involed a lot of rustling of paper.  Stated that he lives in an apartment with a cousin upstairs, no 2 cousins upstairs, no 4 cousins up and 2 on either side.  Ronald Roman, Ronald Roman 03/23/2016 , 1:22 PM

## 2016-03-23 NOTE — Progress Notes (Signed)
Recreation Therapy Notes  Date: 03/23/16 Time: 1000 Location: 500 Hall Dayroom  Group Topic: Coping Skills  Goal Area(s) Addresses:  Pt will be able to identify positive coping skills. Pt will be able to identify the benefits of using positive coping skills.  Behavioral Response: Unengaged  Intervention: Production designer, theatre/television/filmWeb design worksheet, pencils  Activity: OrthoptistWeb Design.  Patients were given a worksheet with a Orthoptistweb design on it.  Patients were to identify and place the things that have gotten them struck in the spider web.  Once patients listed their challenges, they were to come up with at least two coping skills for each thing they listed.  Education: PharmacologistCoping Skills, Building control surveyorDischarge Planning.   Education Outcome: Acknowledges understanding/In group clarification offered/Needs additional education.   Clinical Observations/Feedback: Pt did not participate in group.  Pt was going in and out of group.  Pt finally left and did not return.     Caroll RancherMarjette Sharalee Witman, LRT/CTRS     Caroll RancherLindsay, Eden Toohey A 03/23/2016 11:41 AM

## 2016-03-24 LAB — GLUCOSE, CAPILLARY
GLUCOSE-CAPILLARY: 118 mg/dL — AB (ref 65–99)
GLUCOSE-CAPILLARY: 121 mg/dL — AB (ref 65–99)
Glucose-Capillary: 103 mg/dL — ABNORMAL HIGH (ref 65–99)
Glucose-Capillary: 96 mg/dL (ref 65–99)

## 2016-03-24 LAB — HEMOGLOBIN A1C
HEMOGLOBIN A1C: 8 % — AB (ref 4.8–5.6)
Mean Plasma Glucose: 183 mg/dL

## 2016-03-24 LAB — PROLACTIN: PROLACTIN: 31.5 ng/mL — AB (ref 4.0–15.2)

## 2016-03-24 MED ORDER — CLOZAPINE 25 MG PO TABS: 25.0000 mg | ORAL_TABLET | Freq: Every day | ORAL | Status: DC

## 2016-03-24 MED ORDER — PALIPERIDONE ER 3 MG PO TB24
3.0000 mg | ORAL_TABLET | Freq: Every day | ORAL | Status: DC
  Administered 2016-03-24: 3 mg via ORAL
  Filled 2016-03-24 (×2): qty 1

## 2016-03-24 MED ORDER — DOXEPIN HCL 10 MG PO CAPS
10.0000 mg | ORAL_CAPSULE | Freq: Every day | ORAL | Status: DC
  Administered 2016-03-24: 10 mg via ORAL
  Filled 2016-03-24 (×2): qty 1

## 2016-03-24 MED ORDER — CLOZAPINE 25 MG PO TABS
25.0000 mg | ORAL_TABLET | Freq: Every evening | ORAL | Status: AC
  Administered 2016-03-24 – 2016-03-25 (×2): 25 mg via ORAL
  Filled 2016-03-24 (×2): qty 1

## 2016-03-24 MED ORDER — MAGNESIUM HYDROXIDE 400 MG/5ML PO SUSP
30.0000 mL | Freq: Every day | ORAL | Status: DC
  Administered 2016-03-25 – 2016-04-07 (×13): 30 mL via ORAL

## 2016-03-24 NOTE — Progress Notes (Signed)
D: Pt approached nurses' station while other pts were at lunch with a bag of his belongings. He said he was being discharged today. When told that the MD had not put in such an order, he said she told him he was being discharged this a.m and that she was putting in the order. Generally, pt has been pleasant and cooperative on the unit. He denied SI/HI/AVH. CBGs have been between 100-120 this shift. He reportedly did not sleep well, and said at a.m med pass that he needs "two cold pills." But on his self inventory sheet, he reported good sleep. He also reported good appetite, hyper energy level, and good concentration. He rated his feelings of depression, anxiety, and hopelessness all at zero.    A: Meds given as ordered. Q15 safety checks maintained. Support/encouragement offered. Staff continues enforcement of unit restriction, per MD order.  R: Pt remains free from harm and continues with treatment. Will continue to monitor for needs/safety.

## 2016-03-24 NOTE — Progress Notes (Signed)
Adult Psychoeducational Group Note  Date:  03/24/2016 Time:  9:01 PM  Group Topic/Focus:  Wrap-Up Group:   The focus of this group is to help patients review their daily goal of treatment and discuss progress on daily workbooks.   Participation Level:  Active  Participation Quality:  Appropriate  Affect:  Appropriate  Cognitive:  Alert  Insight: Appropriate  Engagement in Group:  Engaged  Modes of Intervention:  Discussion  Additional Comments:  Patient states, "my day was fantastic/terrific". Patient's goal for today was to have a good day.  Dusti Tetro L Connee Ikner 03/24/2016, 9:01 PM

## 2016-03-24 NOTE — Plan of Care (Signed)
Problem: Safety: Goal: Periods of time without injury will increase Outcome: Adequate for Discharge Pt has been free from self-harm since admission.   Problem: Health Behavior/Discharge Planning: Goal: Compliance with prescribed medication regimen will improve Outcome: Progressing Pt is taking all prescribed medication without incident.   Problem: Nutritional: Goal: Ability to achieve adequate nutritional intake will improve Outcome: Adequate for Discharge Pt is eating an adequate amount at meal times.

## 2016-03-24 NOTE — Progress Notes (Signed)
Recreation Therapy Notes  Date: 03/24/16 Time: 1000 Location: 500 Hall Dayroom  Group Topic: Communication  Goal Area(s) Addresses:  Patient will effectively communicate with peers in group.  Patient will verbalize benefit of healthy communication. Patient will verbalize positive effect of healthy communication on post d/c goals.  Patient will identify communication techniques that made activity effective for group.   Behavioral Response: Engaged  Intervention: ArchivistChairs, beach ball  Activity: Keep It ContractorGoing Volleyball.  Patients were arranged in a circle.  Patients were to toss the beach ball back and forth to each other without letting the ball roll to a stop on the floor.  However, the ball could bounce off the floor.  Education:Communication, Discharge Planning  Education Outcome: Acknowledges understanding/In group clarification offered/Needs additional education.   Clinical Observations/Feedback: Pt was active, worked well with peers.  Pt left twice but came back.  Pt was smiling and social.  Pt also tried to explain the game to one of the students.   Caroll RancherMarjette Jonica Bickhart, LRT/CTRS        Caroll RancherLindsay, Levis Nazir A 03/24/2016 11:46 AM

## 2016-03-24 NOTE — Progress Notes (Signed)
[facility] MD Progress Note  03/24/2016 1:55 PM BORDEN THUNE  MRN:  [id number] Subjective: Patient reports " I am fine."     Objective: LEANDREW KEECH is a 55 year old AA male with history ofparanoid schizophrenia, DM, HTN,who was found walking naked by neighbors, recommended by his case manager to be brought to the hospital for evaluation.  Patient seen and chart reviewed.Discussed patient with treatment team.  Patient today seen as laughing inappropriately often , his thought process is tangential and he continues to be disorganized. Pt is on Clozaril - however has been noncompliant in the past with labs as well as medications. Pt also lives in an independent living , and has social support and services available. Discussed changing his clozaril to [place] - he agrees, reports he has never been tried on it before. Per staff - pt is delusional , disorganized, denies any disruptive issues on the unit. Support , reassurance provided.       Principal Problem: Paranoid schizophrenia (HCC) Diagnosis:   Patient Active Problem List   Diagnosis Date Noted  . Acute psychosis [F23]   . Leukocytosis [D72.829] 02/06/2015  . Sleep apnea [G47.30] 02/05/2015  . AKI (acute kidney injury) (HCC) [N17.9] 02/04/2015  . Seizure disorder (HCC) [G40.909] 02/04/2015  . Acute encephalopathy [G93.40] 02/04/2015  . Diabetes mellitus without complication (HCC) [E11.9]   . Hyperlipidemia [E78.5]   . Hypertension [I10]   . Paranoid schizophrenia (HCC) [F20.0]    Total Time spent with patient: 25 minutes  Past Psychiatric History: Please see H&P.   Past Medical History:  Past Medical History:  Diagnosis Date  . Diabetes mellitus without complication (HCC)   . GERD (gastroesophageal reflux disease)   . Hyperlipidemia   . Hypertension   . Paranoid schizophrenia (HCC)   . Sleep apnea   . Uric acid nephrolithiasis    History reviewed. No pertinent surgical history. Family History: Patient  denies Family Psychiatric  History: Pt denies Social History: Patient reports he [place] in [place] , is single , has children who does not live with him , reports he gets food stamps . History  Alcohol Use No     History  Drug Use No    Social History   Social History  . Marital status: Single    Spouse name: N/A  . Number of children: N/A  . Years of education: N/A   Social History Main Topics  . Smoking status: Current Every Day Smoker    Packs/day: 1.00    Types: Cigarettes  . Smokeless tobacco: Never Used  . Alcohol use No  . Drug use: No  . Sexual activity: Not Currently   Other Topics Concern  . None   Social History Narrative  . None   Additional Social History:                         Sleep: noted as poor , pt unable to comment  Appetite:  Fair  Current Medications: Current Facility-Administered Medications  Medication Dose Route Frequency Provider Last Rate Last Dose  . acetaminophen (TYLENOL) tablet 650 mg  650 mg Oral Q6H PRN Kristeen Mans, NP   650 mg at 03/21/16 0329  . amantadine (SYMMETREL) capsule 100 mg  100 mg Oral BID Laveda Abbe, NP   100 mg at 03/24/16 0811  . cloZAPine (CLOZARIL) tablet 25 mg  25 mg Oral QPM Jomarie Longs, MD      . divalproex (  DEPAKOTE ER) 24 hr tablet 1,000 mg  1,000 mg Oral QHS Jomarie LongsSaramma Laneshia Pina, MD   1,000 mg at 03/23/16 2145  . doxepin (SINEQUAN) capsule 10 mg  10 mg Oral QHS Ivannia Willhelm, MD      . insulin aspart (novoLOG) injection 0-9 Units  0-9 Units Subcutaneous TID WC Laveda AbbeLaurie Britton Parks, NP   1 Units at 03/23/16 [phone]  . insulin glargine (LANTUS) injection 10 Units  10 Units Subcutaneous QHS Laveda AbbeLaurie Britton Parks, NP   10 Units at 03/23/16 2145  . lisinopril (PRINIVIL,ZESTRIL) tablet 10 mg  10 mg Oral Daily Kristeen MansFran E Hobson, NP   10 mg at 03/24/16 0811  . magnesium hydroxide (MILK OF MAGNESIA) suspension 30 mL  30 mL Oral Daily Kerry HoughSpencer E Simon, PA-C      . nicotine polacrilex (NICORETTE) gum 2 mg  2  mg Oral PRN Kamin Niblack, MD      . OLANZapine (ZYPREXA) tablet 5 mg  5 mg Oral TID PRN Jomarie LongsSaramma Anndrea Mihelich, MD       Or  . OLANZapine (ZYPREXA) injection 5 mg  5 mg Intramuscular TID PRN Jomarie LongsSaramma Quintavia Rogstad, MD      . paliperidone (INVEGA) 24 hr tablet 3 mg  3 mg Oral QHS Jomarie LongsSaramma Rayshon Albaugh, MD        Lab Results:  Results for orders placed or performed during the hospital encounter of 03/19/16 (from the past 48 hour(s))  Glucose, capillary     Status: Abnormal   Collection Time: 03/22/16  4:54 PM  Result Value Ref Range   Glucose-Capillary 174 (H) 65 - 99 mg/dL  TSH     Status: None   Collection Time: 03/23/16  6:19 AM  Result Value Ref Range   TSH 1.772 0.350 - 4.500 uIU/mL    Comment: Performed by a 3rd Generation assay with a functional sensitivity of <=0.01 uIU/mL. Performed at [facility]   Lipid panel     Status: Abnormal   Collection Time: 03/23/16  6:19 AM  Result Value Ref Range   Cholesterol 145 0 - 200 mg/dL   Triglycerides 960144 <454<150 mg/dL   HDL 30 (L) >09>40 mg/dL   Total CHOL/HDL Ratio 4.8 RATIO   VLDL 29 0 - 40 mg/dL   LDL Cholesterol 86 0 - 99 mg/dL    Comment:        Total Cholesterol/HDL:CHD Risk Coronary Heart Disease Risk Table                     Men   Women  1/2 Average Risk   3.4   3.3  Average Risk       5.0   4.4  2 X Average Risk   9.6   7.1  3 X Average Risk  23.4   11.0        Use the calculated Patient Ratio above and the CHD Risk Table to determine the patient's CHD Risk.        ATP III CLASSIFICATION (LDL):  <100     mg/dL   Optimal  [id number]  mg/dL   Near or Above                    Optimal  130-159  mg/dL   Borderline  [id number]  mg/dL   High  >213>190     mg/dL   Very High Performed at [facility]   Hemoglobin A1c     Status: Abnormal   Collection Time:  03/23/16  6:19 AM  Result Value Ref Range   Hgb A1c MFr Bld 8.0 (H) 4.8 - 5.6 %    Comment: (NOTE)         Pre-diabetes: 5.7 - 6.4         Diabetes: >6.4         Glycemic  control for adults with diabetes: <7.0    Mean Plasma Glucose 183 mg/dL    Comment: (NOTE) Performed At: [facility] [address] [place], [name] [id number] Mila Homer MD [id number] Performed at [facility]   Prolactin     Status: Abnormal   Collection Time: 03/23/16  6:19 AM  Result Value Ref Range   Prolactin 31.5 (H) 4.0 - 15.2 ng/mL    Comment: (NOTE) Performed At: [facility] [address] [place], [name] [id number] Mila Homer MD [id number] Performed at [facility]   Glucose, capillary     Status: Abnormal   Collection Time: 03/23/16  6:43 AM  Result Value Ref Range   Glucose-Capillary 132 (H) 65 - 99 mg/dL  Glucose, capillary     Status: Abnormal   Collection Time: 03/23/16 11:46 AM  Result Value Ref Range   Glucose-Capillary 109 (H) 65 - 99 mg/dL   Comment 1 Notify RN    Comment 2 Document in Chart   Glucose, capillary     Status: None   Collection Time: 03/23/16  5:15 PM  Result Value Ref Range   Glucose-Capillary 90 65 - 99 mg/dL   Comment 1 Notify RN    Comment 2 Document in Chart   Glucose, capillary     Status: Abnormal   Collection Time: 03/23/16  8:35 PM  Result Value Ref Range   Glucose-Capillary 143 (H) 65 - 99 mg/dL  Glucose, capillary     Status: Abnormal   Collection Time: 03/24/16  6:25 AM  Result Value Ref Range   Glucose-Capillary 118 (H) 65 - 99 mg/dL   Comment 1 Notify RN   Glucose, capillary     Status: Abnormal   Collection Time: 03/24/16 11:58 AM  Result Value Ref Range   Glucose-Capillary 103 (H) 65 - 99 mg/dL    Blood Alcohol level:  Lab Results  Component Value Date   ETH <5 03/18/2016   ETH <5 03/01/2016    Metabolic Disorder Labs: Lab Results  Component Value Date   HGBA1C 8.0 (H) 03/23/2016   MPG 183 03/23/2016   MPG 183 03/20/2016   Lab Results  Component Value Date   PROLACTIN 31.5 (H) 03/23/2016   PROLACTIN 36.6 (H) 03/20/2016   Lab  Results  Component Value Date   CHOL 145 03/23/2016   TRIG 144 03/23/2016   HDL 30 (L) 03/23/2016   CHOLHDL 4.8 03/23/2016   VLDL 29 03/23/2016   LDLCALC 86 03/23/2016   LDLCALC 21 03/20/2016    Physical Findings: AIMS: Facial and Oral Movements Muscles of Facial Expression: None, normal Lips and Perioral Area: None, normal Jaw: None, normal Tongue: None, normal,Extremity Movements Upper (arms, wrists, hands, fingers): None, normal Lower (legs, knees, ankles, toes): None, normal, Trunk Movements Neck, shoulders, hips: None, normal, Overall Severity Severity of abnormal movements (highest score from questions above): None, normal Incapacitation due to abnormal movements: None, normal Patient's awareness of abnormal movements (rate only patient's report): No Awareness, Dental Status Current problems with teeth and/or dentures?: No Does patient usually wear dentures?: No  CIWA:    COWS:  Musculoskeletal: Strength & Muscle Tone: within normal limits Gait & Station: normal Patient leans: N/A  Psychiatric Specialty Exam: Physical Exam  Nursing note and vitals reviewed. Constitutional: He is oriented to person, place, and time. He appears well-developed and well-nourished.  Neurological: He is alert and oriented to person, place, and time.  Skin: Skin is warm and dry.  Psychiatric: He has a normal mood and affect. His behavior is normal.    Review of Systems  Psychiatric/Behavioral: Positive for depression and hallucinations. The patient is nervous/anxious and has insomnia.   All other systems reviewed and are negative.   Blood pressure (!) 147/82, pulse 96, temperature 99.5 F (37.5 C), resp. rate 18, height 5' 5.5" (1.664 m), weight 91.6 kg (202 lb), SpO2 100 %.Body mass index is 33.1 kg/m.  General Appearance: Fairly Groomed  Eye Contact:  Fair  Speech:  Blocked and Slow  Volume:  Decreased  Mood:  Anxious, Depressed and Dysphoric  Affect:  Inappropriate smiles to  self often  Thought Process:  Disorganized, Irrelevant and Descriptions of Associations: Circumstantial  Orientation:  Other:  reports it is autumn, the year as 2017 and the place as [place]  Thought Content:  Delusions, Hallucinations: Auditory, Paranoid Ideation, Rumination and Tangential some improvement  Suicidal Thoughts:  No but is delusional and disorganized  Homicidal Thoughts:  No  Memory:  Immediate;   Fair Recent;   Fair Remote;   Fair  Judgement:  Impaired  Insight:  Shallow  Psychomotor Activity:  Restlessness  Concentration:  Concentration: Fair and Attention Span: Poor  Recall:  Poor  Fund of Knowledge:  Poor  Language:  Fair  Akathisia:  No  Handed:  Right  AIMS (if indicated):     Assets:  Desire for Improvement Resilience  ADL's:  Intact  Cognition:  WNL  Sleep:  Number of Hours: 3.75     Treatment Plan Summary: Osvaldo AngstBarry D Russey is a 55 year old AA male with history ofparanoid schizophrenia, DM, HTN,who was found walking naked by neighbors, recommended by his case manager to be brought to the hospital for evaluation.  Patient today seen as disorganized, delusional - will [facility] - given his inability to be compliant with labs as well as taking his medications , and given the fact that he is going to return to his independent living situation with case management available .  Will make changes - observe on the unit.   Daily contact with patient to assess and evaluate symptoms and progress in treatment and Medication management   Will discontinue  Clozaril for inability to be compliant with labs. Will start a trial of Invega 3 mg po qhs for psychosis. Will offer Invega sustenna IM prior to discharge. Pt agrees with plan. Will continue Depakote , change to ER 1000 mg po qhs for mood sx. Depakote level  - 03/25/16. Will discontinue Trazodone for lack of efficacy. Start Doxepin 10 mg po qhs for sleep. Will continue to monitor vitals  ,medication compliance and treatment side effects while patient is here.  Reviewed labs: EKG - qtc - wnl, TSH- wnl , hba1c- 8- dietician consult once patient is more stable  ,  pl -31.5 ,  lipid panel HDL - low - diet control recommendations to be provided when patient is more stable. CSW will continue  working on disposition.  Patient to participate in therapeutic milieu  Kirubel Aja, MD 03/24/2016, 1:55 PM

## 2016-03-24 NOTE — [facility] Group Notes (Signed)
[facility] LCSW Group Therapy  03/24/2016 2:43 PM   Type of Therapy:  Group Therapy   Participation Level:  Engaged  Participation Quality:  Attentive  Affect:  Appropriate   Cognitive:  Alert   Insight:  Engaged  Engagement in Therapy:  Improving   Modes of Intervention:  Education, Exploration, Socialization   Summary of Progress/Problems: Ronald Roman was engaged off and on. He was in and out of group multiple times.   [name] from [employer] was here to tell his story of recovery, inform patients about [employer] and play his guitar.   Baldo DaubJolan Tegan Britain 03/24/2016 2:43 PM

## 2016-03-25 LAB — GLUCOSE, CAPILLARY
GLUCOSE-CAPILLARY: 136 mg/dL — AB (ref 65–99)
GLUCOSE-CAPILLARY: 151 mg/dL — AB (ref 65–99)
Glucose-Capillary: 110 mg/dL — ABNORMAL HIGH (ref 65–99)
Glucose-Capillary: 120 mg/dL — ABNORMAL HIGH (ref 65–99)

## 2016-03-25 LAB — CBC WITH DIFFERENTIAL/PLATELET
BASOS PCT: 0 %
Basophils Absolute: 0 10*3/uL (ref 0.0–0.1)
EOS ABS: 0.2 10*3/uL (ref 0.0–0.7)
EOS PCT: 2 %
HCT: 38 % — ABNORMAL LOW (ref 39.0–52.0)
Hemoglobin: 12.5 g/dL — ABNORMAL LOW (ref 13.0–17.0)
LYMPHS ABS: 3.5 10*3/uL (ref 0.7–4.0)
Lymphocytes Relative: 31 %
MCH: 26.7 pg (ref 26.0–34.0)
MCHC: 32.9 g/dL (ref 30.0–36.0)
MCV: 81 fL (ref 78.0–100.0)
MONOS PCT: 8 %
Monocytes Absolute: 0.9 10*3/uL (ref 0.1–1.0)
Neutro Abs: 6.6 10*3/uL (ref 1.7–7.7)
Neutrophils Relative %: 59 %
PLATELETS: 344 10*3/uL (ref 150–400)
RBC: 4.69 MIL/uL (ref 4.22–5.81)
RDW: 13.6 % (ref 11.5–15.5)
WBC: 11.2 10*3/uL — AB (ref 4.0–10.5)

## 2016-03-25 LAB — VALPROIC ACID LEVEL: Valproic Acid Lvl: 72 ug/mL (ref 50.0–100.0)

## 2016-03-25 MED ORDER — PALIPERIDONE ER 6 MG PO TB24
6.0000 mg | ORAL_TABLET | Freq: Every day | ORAL | Status: DC
  Administered 2016-03-25: 6 mg via ORAL
  Filled 2016-03-25 (×3): qty 1

## 2016-03-25 MED ORDER — LORAZEPAM 1 MG PO TABS
1.0000 mg | ORAL_TABLET | Freq: Every day | ORAL | Status: DC
  Administered 2016-03-25 – 2016-03-26 (×2): 1 mg via ORAL
  Filled 2016-03-25 (×2): qty 1

## 2016-03-25 MED ORDER — DIVALPROEX SODIUM ER 250 MG PO TB24
1250.0000 mg | ORAL_TABLET | Freq: Every day | ORAL | Status: DC
  Administered 2016-03-25 – 2016-04-26 (×33): 1250 mg via ORAL
  Filled 2016-03-25 (×36): qty 5

## 2016-03-25 MED ORDER — DOXEPIN HCL 25 MG PO CAPS
25.0000 mg | ORAL_CAPSULE | Freq: Every day | ORAL | Status: DC
  Administered 2016-03-25 – 2016-03-27 (×3): 25 mg via ORAL
  Filled 2016-03-25 (×5): qty 1

## 2016-03-25 NOTE — Progress Notes (Signed)
Recreation Therapy Notes  Date: 03/25/16 Time: 1000 Location: 500 Hall Dayroom  Group Topic: Self-Esteem  Goal Area(s) Addresses:  Patient will identify positive ways to increase self-esteem. Patient will verbalize benefit of increased self-esteem.  Intervention: Holiday representativeConstruction paper, scissors, glue sticks, magazines  Activity: License Plate.  Patients were to make a customized license plate that highlighted the positive things about them.  Patients could use magazines to find pictures and words to complete the activity.  If they couldn't find anything in the magazines, they could draw descriptions of themselves.  Education:  Self-Esteem, Building control surveyorDischarge Planning.   Education Outcome: Acknowledges education/In group clarification offered/Needs additional education  Clinical Observations/Feedback: Pt did not attend group.   Caroll RancherMarjette Petr Bontempo, LRT/CTRS         Caroll RancherLindsay, Gwendlyon Zumbro A 03/25/2016 12:40 PM

## 2016-03-25 NOTE — [facility] Group Notes (Addendum)
[facility] Group Notes:  (Nursing/MHT/Case Management/Adjunct)  Date:  03/25/2016  Time:  0930  Type of Therapy:  Nurse Education  Participation Level:  Engaged  Participation Quality:  Attentive  Affect:  Blunted and Flat  Cognitive:  Alert  Insight:  Limited  Engagement in Group:  Developing/Improving and Limited  Modes of Intervention:  Education and Support  Summary of Progress/Problems: Patient reported that his short term goals were to focus on "relaxation" and that his long term goals were to "not eat as much until I'm ready". Patient identified ways to relax such as "dancing, deep breathing, talking, and going for walks" obtained from his work book. Patient was cooperative during group.  Abbie Sonsmanda A Francis 03/25/2016, 1000

## 2016-03-25 NOTE — Progress Notes (Signed)
Nursing Progress Note 7a-7p   D) Patient presents pleasant and cooperative. Patient attended nursing group and state he wanted to work on "relaxation" and "not eat as much". Patient reports sleeping well but was noted by MHT to sleep for only 1 hour. Patient currently denies SI/HI/AVH or pain. Patient appears preoccupied and is slow to respond. Patient has been carrying his paper bags with belongings throughout the unit. Patient is seen in the day room but interacts minimally. Patient contracts for safety at this time.  A) Emotional support given. Patientt on safety checks. Opportunities for questions or concerns presented to patient. Patient encouraged to continue to complete workbook and follow the goals of treatment.  R) Patient receptive to interaction with nurse. Patient remains safe on the unit at this time.

## 2016-03-25 NOTE — Progress Notes (Signed)
D: Pt denies SI/HI/AVH. Pt is pleasant and cooperative. Pt stated he was doing ok today. Pt seen walking on the unit with his bags preparing to leave. Pt informed he can only go home when the doctor lets him go and pt appeared to be all right with this. Pt keeps to himself while on the unit, but is redirectable.   A: Pt was offered support and encouragement. Pt was given scheduled medications. Pt was encourage to attend groups. Q 15 minute checks were done for safety.   R: Pt is taking medication. Pt has no complaints at this time.Pt receptive to treatment and safety maintained on unit.

## 2016-03-25 NOTE — Progress Notes (Signed)
[facility] MD Progress Note  03/25/2016 11:35 AM EZEKIEL MENZER  MRN:  [id number] Subjective: Patient reports " I am ok."     Objective: MOHAMADOU MACIVER is a 55 year old AA male with history ofparanoid schizophrenia, DM, HTN,who was found walking naked by neighbors, recommended by his case manager to be brought to the hospital for evaluation.  Patient seen and chart reviewed.Discussed patient with treatment team.  Patient today is seen as more interactive when writer spoke to him , was able to answer questions appropriately for a few minutes prior to going tangential and making irrelevant comments. Pt continues to be delusional on and off and continues to feels paranoid , walks around with his belongings since he feels they are not safe in his room eventhough he has a private room. Patient continues to have sleep issues - pt reports he sleeps with one eye open because what happened to him in the [employer]. Pt per staff continues to need redirection on the unit , continues to be psychotic, tolerating medications well, denies ADRs. Support , reassurance provided.       Principal Problem: Paranoid schizophrenia (HCC) Diagnosis:   Patient Active Problem List   Diagnosis Date Noted  . Acute psychosis [F23]   . Leukocytosis [D72.829] 02/06/2015  . Sleep apnea [G47.30] 02/05/2015  . AKI (acute kidney injury) (HCC) [N17.9] 02/04/2015  . Seizure disorder (HCC) [G40.909] 02/04/2015  . Acute encephalopathy [G93.40] 02/04/2015  . Diabetes mellitus without complication (HCC) [E11.9]   . Hyperlipidemia [E78.5]   . Hypertension [I10]   . Paranoid schizophrenia (HCC) [F20.0]    Total Time spent with patient: 25 minutes  Past Psychiatric History: Please see H&P.   Past Medical History:  Past Medical History:  Diagnosis Date  . Diabetes mellitus without complication (HCC)   . GERD (gastroesophageal reflux disease)   . Hyperlipidemia   . Hypertension   . Paranoid schizophrenia (HCC)   . Sleep  apnea   . Uric acid nephrolithiasis    History reviewed. No pertinent surgical history. Family History: Patient denies Family Psychiatric  History: Pt denies Social History: Patient reports he [place] in [place] , is single , has children who does not live with him , reports he gets food stamps . History  Alcohol Use No     History  Drug Use No    Social History   Social History  . Marital status: Single    Spouse name: N/A  . Number of children: N/A  . Years of education: N/A   Social History Main Topics  . Smoking status: Current Every Day Smoker    Packs/day: 1.00    Types: Cigarettes  . Smokeless tobacco: Never Used  . Alcohol use No  . Drug use: No  . Sexual activity: Not Currently   Other Topics Concern  . None   Social History Narrative  . None   Additional Social History:                         Sleep: noted as poor   Appetite:  Fair  Current Medications: Current Facility-Administered Medications  Medication Dose Route Frequency Provider Last Rate Last Dose  . acetaminophen (TYLENOL) tablet 650 mg  650 mg Oral Q6H PRN Kristeen Mans, NP   650 mg at 03/21/16 0329  . amantadine (SYMMETREL) capsule 100 mg  100 mg Oral BID Laveda Abbe, NP   100 mg at 03/25/16 4098  .  cloZAPine (CLOZARIL) tablet 25 mg  25 mg Oral QPM Viggo Perko, MD   25 mg at 03/24/16 1733  . divalproex (DEPAKOTE ER) 24 hr tablet 1,000 mg  1,000 mg Oral QHS Jomarie LongsSaramma Antaniya Venuti, MD   1,000 mg at 03/24/16 2051  . doxepin (SINEQUAN) capsule 10 mg  10 mg Oral QHS Jomarie LongsSaramma Izic Stfort, MD   10 mg at 03/24/16 2051  . insulin aspart (novoLOG) injection 0-9 Units  0-9 Units Subcutaneous TID WC Laveda AbbeLaurie Britton Parks, NP   1 Units at 03/25/16 [phone]  . insulin glargine (LANTUS) injection 10 Units  10 Units Subcutaneous QHS Laveda AbbeLaurie Britton Parks, NP   10 Units at 03/23/16 2145  . lisinopril (PRINIVIL,ZESTRIL) tablet 10 mg  10 mg Oral Daily Kristeen MansFran E Hobson, NP   10 mg at 03/25/16 [id number]  . magnesium  hydroxide (MILK OF MAGNESIA) suspension 30 mL  30 mL Oral Daily Kerry HoughSpencer E Simon, PA-C   30 mL at 03/25/16 [phone]  . nicotine polacrilex (NICORETTE) gum 2 mg  2 mg Oral PRN Jomarie LongsSaramma Evyn Putzier, MD      . OLANZapine (ZYPREXA) tablet 5 mg  5 mg Oral TID PRN Jomarie LongsSaramma Dona Walby, MD   5 mg at 03/24/16 2227   Or  . OLANZapine (ZYPREXA) injection 5 mg  5 mg Intramuscular TID PRN Jomarie LongsSaramma Fread Kottke, MD      . paliperidone (INVEGA) 24 hr tablet 6 mg  6 mg Oral QHS Jomarie LongsSaramma Adell Panek, MD        Lab Results:  Results for orders placed or performed during the hospital encounter of 03/19/16 (from the past 48 hour(s))  Glucose, capillary     Status: Abnormal   Collection Time: 03/23/16 11:46 AM  Result Value Ref Range   Glucose-Capillary 109 (H) 65 - 99 mg/dL   Comment 1 Notify RN    Comment 2 Document in Chart   Glucose, capillary     Status: None   Collection Time: 03/23/16  5:15 PM  Result Value Ref Range   Glucose-Capillary 90 65 - 99 mg/dL   Comment 1 Notify RN    Comment 2 Document in Chart   Glucose, capillary     Status: Abnormal   Collection Time: 03/23/16  8:35 PM  Result Value Ref Range   Glucose-Capillary 143 (H) 65 - 99 mg/dL  Glucose, capillary     Status: Abnormal   Collection Time: 03/24/16  6:25 AM  Result Value Ref Range   Glucose-Capillary 118 (H) 65 - 99 mg/dL   Comment 1 Notify RN   Glucose, capillary     Status: Abnormal   Collection Time: 03/24/16 11:58 AM  Result Value Ref Range   Glucose-Capillary 103 (H) 65 - 99 mg/dL  Glucose, capillary     Status: None   Collection Time: 03/24/16  5:31 PM  Result Value Ref Range   Glucose-Capillary 96 65 - 99 mg/dL  Glucose, capillary     Status: Abnormal   Collection Time: 03/24/16  8:20 PM  Result Value Ref Range   Glucose-Capillary 121 (H) 65 - 99 mg/dL  Glucose, capillary     Status: Abnormal   Collection Time: 03/25/16  6:04 AM  Result Value Ref Range   Glucose-Capillary 136 (H) 65 - 99 mg/dL   Comment 1 Notify RN    Comment 2 Document  in Chart   Valproic acid level     Status: None   Collection Time: 03/25/16  7:17 AM  Result Value Ref Range   Valproic Acid  Lvl 72 50.0 - 100.0 ug/mL    Comment: [facility]  CBC with Differential/Platelet     Status: Abnormal   Collection Time: 03/25/16  7:17 AM  Result Value Ref Range   WBC 11.2 (H) 4.0 - 10.5 K/uL   RBC 4.69 4.22 - 5.81 MIL/uL   Hemoglobin 12.5 (L) 13.0 - 17.0 g/dL   HCT 16.138.0 (L) 09.639.0 - 04.552.0 %   MCV 81.0 78.0 - 100.0 fL   MCH 26.7 26.0 - 34.0 pg   MCHC 32.9 30.0 - 36.0 g/dL   RDW 40.913.6 81.111.5 - 91.415.5 %   Platelets 344 150 - 400 K/uL   Neutrophils Relative % 59 %   Neutro Abs 6.6 1.7 - 7.7 K/uL   Lymphocytes Relative 31 %   Lymphs Abs 3.5 0.7 - 4.0 K/uL   Monocytes Relative 8 %   Monocytes Absolute 0.9 0.1 - 1.0 K/uL   Eosinophils Relative 2 %   Eosinophils Absolute 0.2 0.0 - 0.7 K/uL   Basophils Relative 0 %   Basophils Absolute 0.0 0.0 - 0.1 K/uL    Comment: Performed at [facility]    Blood Alcohol level:  Lab Results  Component Value Date   [facility] 03/18/2016   ETH <5 03/01/2016    Metabolic Disorder Labs: Lab Results  Component Value Date   HGBA1C 8.0 (H) 03/23/2016   MPG 183 03/23/2016   MPG 183 03/20/2016   Lab Results  Component Value Date   PROLACTIN 31.5 (H) 03/23/2016   PROLACTIN 36.6 (H) 03/20/2016   Lab Results  Component Value Date   CHOL 145 03/23/2016   TRIG 144 03/23/2016   HDL 30 (L) 03/23/2016   CHOLHDL 4.8 03/23/2016   VLDL 29 03/23/2016   LDLCALC 86 03/23/2016   LDLCALC 21 03/20/2016    Physical Findings: AIMS: Facial and Oral Movements Muscles of Facial Expression: None, normal Lips and Perioral Area: None, normal Jaw: None, normal Tongue: None, normal,Extremity Movements Upper (arms, wrists, hands, fingers): None, normal Lower (legs, knees, ankles, toes): None, normal, Trunk Movements Neck, shoulders, hips: None, normal, Overall Severity Severity of abnormal  movements (highest score from questions above): None, normal Incapacitation due to abnormal movements: None, normal Patient's awareness of abnormal movements (rate only patient's report): No Awareness, Dental Status Current problems with teeth and/or dentures?: No Does patient usually wear dentures?: No  CIWA:    COWS:     Musculoskeletal: Strength & Muscle Tone: within normal limits Gait & Station: normal Patient leans: N/A  Psychiatric Specialty Exam: Physical Exam  Nursing note and vitals reviewed. Constitutional: He is oriented to person, place, and time. He appears well-developed and well-nourished.  Neurological: He is alert and oriented to person, place, and time.  Skin: Skin is warm and dry.  Psychiatric: He has a normal mood and affect. His behavior is normal.    Review of Systems  Psychiatric/Behavioral: Positive for depression and hallucinations. The patient is nervous/anxious and has insomnia.   All other systems reviewed and are negative.   Blood pressure 107/73, pulse (!) 101, temperature 98.6 F (37 C), temperature source Oral, resp. rate 16, height 5' 5.5" (1.664 m), weight 91.6 kg (202 lb), SpO2 100 %.Body mass index is 33.1 kg/m.  General Appearance: Disheveled  Eye Contact:  Fair  Speech:  Blocked and Slow  Volume:  Decreased  Mood:  Anxious, Depressed and Dysphoric  Affect:  Inappropriate smiles to self often  Thought Process:  Disorganized,  Irrelevant and Descriptions of Associations: Circumstantial  Orientation:  Full (Time, Place, and Person)  Thought Content:  Delusions, Hallucinations: Auditory, Paranoid Ideation, Rumination and Tangential some improvement  Suicidal Thoughts:  No but is delusional and disorganized  Homicidal Thoughts:  No  Memory:  Immediate;   Fair Recent;   Fair Remote;   Fair  Judgement:  Impaired  Insight:  Shallow  Psychomotor Activity:  Restlessness  Concentration:  Concentration: Fair and Attention Span: Poor  Recall:   Poor  Fund of Knowledge:  Poor  Language:  Fair  Akathisia:  No  Handed:  Right  AIMS (if indicated):     Assets:  Desire for Improvement Resilience  ADL's:  Intact  Cognition:  WNL  Sleep:  Number of Hours: 1     Treatment Plan Summary: ORMAN MATSUMURA is a 56 year old AA male with history ofparanoid schizophrenia, DM, HTN,who was found walking naked by neighbors, recommended by his case manager to be brought to the hospital for evaluation.  Patient today seen as walking around with a paper bag full of belongings , is psychotic , and has sleep issues , will readjust medications .  Daily contact with patient to assess and evaluate symptoms and progress in treatment and Medication management   Will discontinue  Clozaril for inability to be compliant with labs. Will increase Invega to 6 mg po qhs for psychosis. Will offer Invega sustenna IM prior to discharge. Pt agrees with plan. Will increase Depakote  ER to 1250 mg po qhs for mood lability , although  Depakote level  - 03/25/16- 72 ( therapeutic)  Will increase Doxepin to 25 mg po qhs for sleep. Will add Ativan 1 mg po qhs for anxiety tonight. Will continue to monitor vitals ,medication compliance and treatment side effects while patient is here.  Reviewed labs: EKG - qtc - wnl, TSH- wnl , hba1c- 8- dietician consult once patient is more stable  ,  pl -31.5 ,  lipid panel HDL - low - diet control recommendations to be provided when patient is more stable. CSW will continue  working on disposition.  Patient to participate in therapeutic milieu Nithya Meriweather, MD 03/25/2016, 11:35 AM

## 2016-03-25 NOTE — [facility] Group Notes (Signed)
Type of Therapy: Process Group Therapy  Participation Level:  Active  Participation Quality:  Appropriate  Affect:  Flat  Cognitive:  Oriented  Insight:  Improving  Engagement in Group:  Limited  Engagement in Therapy:  Limited  Modes of Intervention:  Activity, Clarification, Education, Problem-solving and Support  Summary of Progress/Problems: Ronald Roman was constantly in and out of group. However he did state that he enjoys playing all sports including ping-pong, volleyball and racket ball to help him regain balance emotionally.   Today's group addressed balance in life. Patients participated in the discussion about when their life was in balance and out of balance and how this feels. Patients discussed ways to get back in balance and short term goals they can work on to get where they want to be.

## 2016-03-25 NOTE — Progress Notes (Signed)
  D; When asked about his day pt mumbled. Then eventually told the writer he may be getting out tomorrow. When asked about his plans after discharge, pt stated "I done told this over and over again. I'm going back to work. I work for myself". Then the pt walked off.  A:  Support and encouragement was offered. 15 min checks continued for safety.  R: Pt remains safe.

## 2016-03-25 NOTE — Tx Team (Signed)
Interdisciplinary Treatment and Diagnostic Plan Update  03/25/2016 Time of Session: 9:13 AM  Ronald Roman MRN: [id number]  Principal Diagnosis: Paranoid schizophrenia [facility])  Secondary Diagnoses: Principal Problem:   Paranoid schizophrenia ([place])   Current Medications:  Current Facility-Administered Medications  Medication Dose Route Frequency Provider Last Rate Last Dose  . acetaminophen (TYLENOL) tablet 650 mg  650 mg Oral Q6H PRN Lurena Nida, NP   650 mg at 03/21/16 0329  . amantadine (SYMMETREL) capsule 100 mg  100 mg Oral BID Ethelene Hal, NP   100 mg at 03/25/16 4742  . cloZAPine (CLOZARIL) tablet 25 mg  25 mg Oral QPM Saramma Eappen, MD   25 mg at 03/24/16 1733  . divalproex (DEPAKOTE ER) 24 hr tablet 1,000 mg  1,000 mg Oral QHS Ursula Alert, MD   1,000 mg at 03/24/16 2051  . doxepin (SINEQUAN) capsule 10 mg  10 mg Oral QHS Ursula Alert, MD   10 mg at 03/24/16 2051  . insulin aspart (novoLOG) injection 0-9 Units  0-9 Units Subcutaneous TID WC Ethelene Hal, NP   1 Units at 03/25/16 [phone]  . insulin glargine (LANTUS) injection 10 Units  10 Units Subcutaneous QHS Ethelene Hal, NP   10 Units at 03/23/16 2145  . lisinopril (PRINIVIL,ZESTRIL) tablet 10 mg  10 mg Oral Daily Lurena Nida, NP   10 mg at 03/25/16 3875  . magnesium hydroxide (MILK OF MAGNESIA) suspension 30 mL  30 mL Oral Daily Laverle Hobby, PA-C   30 mL at 03/25/16 6433  . nicotine polacrilex (NICORETTE) gum 2 mg  2 mg Oral PRN Ursula Alert, MD      . OLANZapine (ZYPREXA) tablet 5 mg  5 mg Oral TID PRN Ursula Alert, MD   5 mg at 03/24/16 2227   Or  . OLANZapine (ZYPREXA) injection 5 mg  5 mg Intramuscular TID PRN Ursula Alert, MD      . paliperidone (INVEGA) 24 hr tablet 3 mg  3 mg Oral QHS Ursula Alert, MD   3 mg at 03/24/16 2051    PTA Medications: Prescriptions Prior to Admission  Medication Sig Dispense Refill Last Dose  . albuterol (PROVENTIL HFA;VENTOLIN HFA) 108 (90  BASE) MCG/ACT inhaler Inhale 2 puffs into the lungs every 4 (four) hours as needed for wheezing or shortness of breath. (Patient not taking: Reported on 03/19/2016) 1 Inhaler 0 Not Taking at Unknown time  . cetirizine (ZYRTEC) 10 MG chewable tablet Chew 1 tablet (10 mg total) by mouth daily. (Patient not taking: Reported on 03/19/2016) 20 tablet 1 Not Taking at Unknown time  . cloZAPine (CLOZARIL) 100 MG tablet Take 1 tablet (100 mg total) by mouth 2 (two) times daily. (Patient not taking: Reported on 03/19/2016) 60 tablet 0 Not Taking at Unknown time  . divalproex (DEPAKOTE) 250 MG DR tablet Take 1 tablet (250 mg total) by mouth at bedtime. (Patient not taking: Reported on 03/19/2016) 30 tablet 0 Not Taking at Unknown time  . divalproex (DEPAKOTE) 500 MG DR tablet Take 2 tablets (1,000 mg total) by mouth at bedtime. (Patient not taking: Reported on 03/19/2016) 60 tablet 0 Not Taking at Unknown time  . insulin glargine (LANTUS) 100 UNIT/ML injection Inject 10 Units into the skin at bedtime.   Not Taking at Unknown time  . lisinopril (PRINIVIL,ZESTRIL) 10 MG tablet Take 10 mg by mouth daily.    Not Taking at Unknown time  . LORazepam (ATIVAN) 0.5 MG tablet Take 1 tablet (0.5  mg total) by mouth 2 (two) times daily. (Patient not taking: Reported on 03/19/2016) 60 tablet 0 Not Taking at Unknown time  . metoprolol tartrate (LOPRESSOR) 25 MG tablet Take 25 mg by mouth 2 (two) times daily.   Not Taking at Unknown time  . QUEtiapine (SEROQUEL) 100 MG tablet Take 1 tablet (100 mg total) by mouth at bedtime. (Patient not taking: Reported on 03/19/2016) 30 tablet 0 Not Taking at Unknown time  . QUEtiapine (SEROQUEL) 50 MG tablet Take 1 tablet (50 mg total) by mouth daily. (Patient not taking: Reported on 03/19/2016) 30 tablet 0 Not Taking at Unknown time  . zolpidem (AMBIEN) 5 MG tablet Take 1 tablet (5 mg total) by mouth at bedtime as needed for sleep. (Patient not taking: Reported on 03/19/2016) 30 tablet 0 Not  Taking at Unknown time    Treatment Modalities: Medication Management, Group therapy, Case management,  1 to 1 session with clinician, Psychoeducation, Recreational therapy.   Physician Treatment Plan for Primary Diagnosis: Paranoid schizophrenia ([place]) Long Term Goal(s): Improvement in symptoms so as ready for discharge  Short Term Goals: Ability to identify changes in lifestyle to reduce recurrence of condition will improve  Medication Management: Evaluate patient's response, side effects, and tolerance of medication regimen.  Therapeutic Interventions: 1 to 1 sessions, Unit Group sessions and Medication administration.  Evaluation of Outcomes: Not Met  Physician Treatment Plan for Secondary Diagnosis: Principal Problem:   Paranoid schizophrenia ([place])   Long Term Goal(s): Improvement in symptoms so as ready for discharge  Short Term Goals: Ability to identify and develop effective coping behaviors will improve  Medication Management: Evaluate patient's response, side effects, and tolerance of medication regimen.  Therapeutic Interventions: 1 to 1 sessions, Unit Group sessions and Medication administration.  Evaluation of Outcomes: Not Met  11/2: Patient today seen as disorganized, delusional - will change his clozaril to [place] - given his inability to be compliant with labs as well as taking his medications , and given the fact that he is going to return to his independent living situation with case management available . Will discontinue  Clozaril for inability to be compliant with labs. Will start a trial of Invega 3 mg po qhs for psychosis. Will offer Invega sustenna IM prior to discharge. Pt agrees with plan. Will continue Depakote , change to ER 1000 mg po qhs for mood sx. Depakote level  - 03/25/16. Will discontinue Trazodone for lack of efficacy. Start Doxepin 10 mg po qhs for sleep.   RN Treatment Plan for Primary Diagnosis: Paranoid schizophrenia ([place]) Long Term  Goal(s): Knowledge of disease and therapeutic regimen to maintain health will improve  Short Term Goals: Compliance with prescribed medications will improve  Medication Management: RN will administer medications as ordered by provider, will assess and evaluate patient's response and provide education to patient for prescribed medication. RN will report any adverse and/or side effects to prescribing provider.  Therapeutic Interventions: 1 on 1 counseling sessions, Psychoeducation, Medication administration, Evaluate responses to treatment, Monitor vital signs and CBGs as ordered, Perform/monitor CIWA, COWS, AIMS and Fall Risk screenings as ordered, Perform wound care treatments as ordered.  Evaluation of Outcomes: Progressing   LCSW Treatment Plan for Primary Diagnosis: Paranoid schizophrenia ([place]) Long Term Goal(s): Safe transition to appropriate next level of care at discharge, Engage patient in therapeutic group addressing interpersonal concerns.  Short Term Goals: Engage patient in aftercare planning with referrals and resources  Therapeutic Interventions: Assess for all discharge needs, 1 to 1 time  with Social worker, Explore available resources and support systems, Assess for adequacy in community support network, Educate family and significant other(s) on suicide prevention, Complete Psychosocial Assessment, Interpersonal group therapy.  Evaluation of Outcomes: Progressing  CSW left message for Melody Jefferey Pica transition coordinator at [phone]. 11/2:  [facility] does not support referral to [facility].  However, they are requesting referral to [facility] as a woman with whom Johnpaul had a good relationship with in the past has transferred to that agency.  As noted above, we are trying to switch him to injectable due to recent history of non-compliance with meds, and the fact that he will not be monitored daily.   Progress in Treatment: Attending groups: No Participating in  groups: No Taking medication as prescribed: Yes Toleration medication: Yes, no side effects reported at this time Family/Significant other contact made: Yes  See above Patient understands diagnosis: No  Limited insight Discussing patient identified problems/goals with staff: Yes Medical problems stabilized or resolved: Yes Denies suicidal/homicidal ideation: Yes Issues/concerns per patient self-inventory: None Other: N/A  New problem(s) identified: None identified at this time.   New Short Term/Long Term Goal(s): None identified at this time.   Discharge Plan or Barriers: Likley return home, follow up outpt  Reason for Continuation of Hospitalization: Disorganization Medication stabilization   Estimated Length of Stay: 3-5 days  Attendees: Patient: 03/25/2016  9:13 AM  Physician: Ursula Alert, MD 03/25/2016  9:13 AM  Nursing: Hoy Register, RN 03/25/2016  9:13 AM  RN Care Manager: Lars Pinks, RN 03/25/2016  9:13 AM  Social Worker: Ripley Fraise 03/25/2016  9:13 AM  Recreational Therapist: Laretta Bolster  03/25/2016  9:13 AM  Other: Norberto Sorenson 03/25/2016  9:13 AM  Other:  03/25/2016  9:13 AM    Scribe for Treatment Team:  Roque Lias 03/25/2016 9:13 AM

## 2016-03-25 NOTE — Plan of Care (Signed)
Problem: Activity: Goal: Will verbalize the importance of balancing activity with adequate rest periods Outcome: Progressing Patient reported need for rest and self care in nursing group.  Problem: Self-Care: Goal: Ability to participate in self-care as condition permits will improve Outcome: Progressing Patient taking medications as prescribed and filled out self-inventory sheet today.

## 2016-03-25 NOTE — Progress Notes (Signed)
Pt unit restrictions were D/C'd earlier today

## 2016-03-26 LAB — GLUCOSE, CAPILLARY
GLUCOSE-CAPILLARY: 140 mg/dL — AB (ref 65–99)
GLUCOSE-CAPILLARY: 117 mg/dL — AB (ref 65–99)
GLUCOSE-CAPILLARY: 104 mg/dL — AB (ref 65–99)
GLUCOSE-CAPILLARY: 166 mg/dL — AB (ref 65–99)

## 2016-03-26 MED ORDER — PALIPERIDONE ER 3 MG PO TB24
9.0000 mg | ORAL_TABLET | Freq: Every day | ORAL | Status: DC
  Administered 2016-03-26 – 2016-03-27 (×2): 9 mg via ORAL
  Filled 2016-03-26 (×4): qty 1

## 2016-03-26 NOTE — Progress Notes (Signed)
[facility] MD Progress Note  03/26/2016 1:28 PM Ronald Roman  MRN:  [id number] Subjective: Patient reports " I am fine."     Objective: Ronald Roman is a 55 year old AA male with history ofparanoid schizophrenia, DM, HTN,who was found walking naked by neighbors, recommended by his case manager to be brought to the hospital for evaluation.  Patient seen and chart reviewed.Discussed patient with treatment team.  Patient today is seen as guarded , response to questions are in monosyllables , but appropriate. Pt reports sleep as better last night - and per EHR - noted as 6 hrs - which is a drastic improvement for patient. Pt per nursing continues to be disorganized and delusional on and off - seen as walking around with her brown paper bag full of his belongings and requiring redirection on the unit. Per nursing pt also had some issues with BM this AM - spend a lot of time in the bathroom - likely constipated - offered stool softener - pt declined . Support , reassurance provided.       Principal Problem: Paranoid schizophrenia (HCC) Diagnosis:   Patient Active Problem List   Diagnosis Date Noted  . Acute psychosis [F23]   . Leukocytosis [D72.829] 02/06/2015  . Sleep apnea [G47.30] 02/05/2015  . AKI (acute kidney injury) (HCC) [N17.9] 02/04/2015  . Seizure disorder (HCC) [G40.909] 02/04/2015  . Acute encephalopathy [G93.40] 02/04/2015  . Diabetes mellitus without complication (HCC) [E11.9]   . Hyperlipidemia [E78.5]   . Hypertension [I10]   . Paranoid schizophrenia (HCC) [F20.0]    Total Time spent with patient: 25 minutes  Past Psychiatric History: Please see H&P.   Past Medical History:  Past Medical History:  Diagnosis Date  . Diabetes mellitus without complication (HCC)   . GERD (gastroesophageal reflux disease)   . Hyperlipidemia   . Hypertension   . Paranoid schizophrenia (HCC)   . Sleep apnea   . Uric acid nephrolithiasis    History reviewed. No pertinent  surgical history. Family History: Patient denies Family Psychiatric  History: Pt denies Social History: Patient reports he [place] in [place] , is single , has children who does not live with him , reports he gets food stamps . History  Alcohol Use No     History  Drug Use No    Social History   Social History  . Marital status: Single    Spouse name: N/A  . Number of children: N/A  . Years of education: N/A   Social History Main Topics  . Smoking status: Current Every Day Smoker    Packs/day: 1.00    Types: Cigarettes  . Smokeless tobacco: Never Used  . Alcohol use No  . Drug use: No  . Sexual activity: Not Currently   Other Topics Concern  . None   Social History Narrative  . None   Additional Social History:                         Sleep: Fair  Appetite:  Fair  Current Medications: Current Facility-Administered Medications  Medication Dose Route Frequency Provider Last Rate Last Dose  . acetaminophen (TYLENOL) tablet 650 mg  650 mg Oral Q6H PRN Kristeen Mans, NP   650 mg at 03/21/16 0329  . amantadine (SYMMETREL) capsule 100 mg  100 mg Oral BID Laveda Abbe, NP   100 mg at 03/25/16 1713  . divalproex (DEPAKOTE ER) 24 hr tablet 1,250 mg  1,250 mg Oral QHS Jomarie LongsSaramma Charnese Federici, MD   1,250 mg at 03/25/16 2126  . doxepin (SINEQUAN) capsule 25 mg  25 mg Oral QHS Jomarie LongsSaramma Keyaira Clapham, MD   25 mg at 03/25/16 2126  . insulin aspart (novoLOG) injection 0-9 Units  0-9 Units Subcutaneous TID WC Laveda AbbeLaurie Britton Parks, NP   1 Units at 03/26/16 0617  . insulin glargine (LANTUS) injection 10 Units  10 Units Subcutaneous QHS Laveda AbbeLaurie Britton Parks, NP   10 Units at 03/25/16 2126  . lisinopril (PRINIVIL,ZESTRIL) tablet 10 mg  10 mg Oral Daily Kristeen MansFran E Hobson, NP   10 mg at 03/25/16 [id number]  . LORazepam (ATIVAN) tablet 1 mg  1 mg Oral QHS Jomarie LongsSaramma Toma Arts, MD   1 mg at 03/25/16 2126  . magnesium hydroxide (MILK OF MAGNESIA) suspension 30 mL  30 mL Oral Daily Kerry HoughSpencer E Simon, PA-C    30 mL at 03/25/16 [id number]  . nicotine polacrilex (NICORETTE) gum 2 mg  2 mg Oral PRN Jomarie LongsSaramma Rorik Vespa, MD      . OLANZapine (ZYPREXA) tablet 5 mg  5 mg Oral TID PRN Jomarie LongsSaramma Xayvier Vallez, MD   5 mg at 03/24/16 2227   Or  . OLANZapine (ZYPREXA) injection 5 mg  5 mg Intramuscular TID PRN Jomarie LongsSaramma Breniyah Romm, MD      . paliperidone (INVEGA) 24 hr tablet 6 mg  6 mg Oral QHS Jomarie LongsSaramma Anjolaoluwa Siguenza, MD   6 mg at 03/25/16 2126    Lab Results:  Results for orders placed or performed during the hospital encounter of 03/19/16 (from the past 48 hour(s))  Glucose, capillary     Status: None   Collection Time: 03/24/16  5:31 PM  Result Value Ref Range   Glucose-Capillary 96 65 - 99 mg/dL  Glucose, capillary     Status: Abnormal   Collection Time: 03/24/16  8:20 PM  Result Value Ref Range   Glucose-Capillary 121 (H) 65 - 99 mg/dL  Glucose, capillary     Status: Abnormal   Collection Time: 03/25/16  6:04 AM  Result Value Ref Range   Glucose-Capillary 136 (H) 65 - 99 mg/dL   Comment 1 Notify RN    Comment 2 Document in Chart   Valproic acid level     Status: None   Collection Time: 03/25/16  7:17 AM  Result Value Ref Range   Valproic Acid Lvl 72 50.0 - 100.0 ug/mL    Comment: Performed at [facility]  CBC with Differential/Platelet     Status: Abnormal   Collection Time: 03/25/16  7:17 AM  Result Value Ref Range   WBC 11.2 (H) 4.0 - 10.5 K/uL   RBC 4.69 4.22 - 5.81 MIL/uL   Hemoglobin 12.5 (L) 13.0 - 17.0 g/dL   HCT 54.038.0 (L) 98.139.0 - 19.152.0 %   MCV 81.0 78.0 - 100.0 fL   MCH 26.7 26.0 - 34.0 pg   MCHC 32.9 30.0 - 36.0 g/dL   RDW 47.813.6 29.511.5 - 62.115.5 %   Platelets 344 150 - 400 K/uL   Neutrophils Relative % 59 %   Neutro Abs 6.6 1.7 - 7.7 K/uL   Lymphocytes Relative 31 %   Lymphs Abs 3.5 0.7 - 4.0 K/uL   Monocytes Relative 8 %   Monocytes Absolute 0.9 0.1 - 1.0 K/uL   Eosinophils Relative 2 %   Eosinophils Absolute 0.2 0.0 - 0.7 K/uL   Basophils Relative 0 %   Basophils Absolute 0.0 0.0 - 0.1 K/uL     Comment: Performed  at [facility]  Glucose, capillary     Status: Abnormal   Collection Time: 03/25/16 11:39 AM  Result Value Ref Range   Glucose-Capillary 110 (H) 65 - 99 mg/dL   Comment 1 Notify RN    Comment 2 Document in Chart   Glucose, capillary     Status: Abnormal   Collection Time: 03/25/16  4:54 PM  Result Value Ref Range   Glucose-Capillary 120 (H) 65 - 99 mg/dL   Comment 1 Notify RN    Comment 2 Document in Chart   Glucose, capillary     Status: Abnormal   Collection Time: 03/25/16  8:16 PM  Result Value Ref Range   Glucose-Capillary 151 (H) 65 - 99 mg/dL  Glucose, capillary     Status: Abnormal   Collection Time: 03/26/16  5:45 AM  Result Value Ref Range   Glucose-Capillary 140 (H) 65 - 99 mg/dL    Blood Alcohol level:  Lab Results  Component Value Date   ETH <5 03/18/2016   ETH <5 03/01/2016    Metabolic Disorder Labs: Lab Results  Component Value Date   HGBA1C 8.0 (H) 03/23/2016   MPG 183 03/23/2016   MPG 183 03/20/2016   Lab Results  Component Value Date   PROLACTIN 31.5 (H) 03/23/2016   PROLACTIN 36.6 (H) 03/20/2016   Lab Results  Component Value Date   CHOL 145 03/23/2016   TRIG 144 03/23/2016   HDL 30 (L) 03/23/2016   CHOLHDL 4.8 03/23/2016   VLDL 29 03/23/2016   LDLCALC 86 03/23/2016   LDLCALC 21 03/20/2016    Physical Findings: AIMS: Facial and Oral Movements Muscles of Facial Expression: None, normal Lips and Perioral Area: None, normal Jaw: None, normal Tongue: None, normal,Extremity Movements Upper (arms, wrists, hands, fingers): None, normal Lower (legs, knees, ankles, toes): None, normal, Trunk Movements Neck, shoulders, hips: None, normal, Overall Severity Severity of abnormal movements (highest score from questions above): None, normal Incapacitation due to abnormal movements: None, normal Patient's awareness of abnormal movements (rate only patient's report): No Awareness, Dental Status Current  problems with teeth and/or dentures?: No Does patient usually wear dentures?: No  CIWA:    COWS:     Musculoskeletal: Strength & Muscle Tone: within normal limits Gait & Station: normal Patient leans: N/A  Psychiatric Specialty Exam: Physical Exam  Nursing note and vitals reviewed. Constitutional: He is oriented to person, place, and time. He appears well-developed and well-nourished.  Neurological: He is alert and oriented to person, place, and time.  Skin: Skin is warm and dry.  Psychiatric: He has a normal mood and affect. His behavior is normal.    Review of Systems  Psychiatric/Behavioral: Positive for depression and hallucinations. The patient is nervous/anxious.   All other systems reviewed and are negative.   Blood pressure 122/80, pulse (!) 111, temperature 98.5 F (36.9 C), temperature source Oral, resp. rate 16, height 5' 5.5" (1.664 m), weight 91.6 kg (202 lb), SpO2 100 %.Body mass index is 33.1 kg/m.  General Appearance: Casual and Guarded  Eye Contact:  Fair  Speech:  Blocked and Slow improving  Volume:  Decreased  Mood:  Anxious and Depressed improving  Affect:  Inappropriate smiles to self often  Thought Process:  Disorganized, Irrelevant and Descriptions of Associations: Circumstantial improving  Orientation:  Full (Time, Place, and Person)  Thought Content:  Delusions, Hallucinations: Auditory, Paranoid Ideation, Rumination and Tangential some improvement  Suicidal Thoughts:  No but is delusional and disorganized  Homicidal Thoughts:  No  Memory:  Immediate;   Fair Recent;   Fair Remote;   Fair  Judgement:  Impaired  Insight:  Shallow  Psychomotor Activity:  Restlessness  Concentration:  Concentration: Fair and Attention Span: Poor  Recall:  Poor  Fund of Knowledge:  Poor  Language:  Fair  Akathisia:  No  Handed:  Right  AIMS (if indicated):     Assets:  Desire for Improvement Resilience  ADL's:  Intact  Cognition:  WNL  Sleep:  Number of Hours:  6     Treatment Plan Summary: Osvaldo AngstBarry D Corallo is a 55 year old AA male with history ofparanoid schizophrenia, DM, HTN,who was found walking naked by neighbors, recommended by his case manager to be brought to the hospital for evaluation.  Patient today seen as improving , sleep is better ,  will continue treatment.  Daily contact with patient to assess and evaluate symptoms and progress in treatment and Medication management    Will increase Invega to 9 mg po qhs for psychosis. Will offer Invega sustenna IM prior to discharge. Pt agrees with plan. Increased  Depakote  ER to 1250 mg po qhs for mood lability , although  Depakote level  - 03/25/16- 72 ( therapeutic) , next depakote level on 03/30/16. Will continue Doxepin  25 mg po qhs for sleep. Will continue Ativan 1 mg po qhs for anxiety tonight. Will continue to monitor vitals ,medication compliance and treatment side effects while patient is here.  Reviewed labs: EKG - qtc - wnl, TSH- wnl , hba1c- 8- dietician consult once patient is more stable  ,  pl -31.5 ,  lipid panel HDL - low - diet control recommendations to be provided when patient is more stable. CSW will continue  working on disposition.  Patient to participate in therapeutic milieu Charnee Turnipseed, MD 03/26/2016, 1:28 PM

## 2016-03-26 NOTE — Plan of Care (Signed)
Problem: Coping: Goal: Ability to verbalize feelings will improve Outcome: Not Progressing Patient struggles to verbalize thoughts coherently.  Problem: Role Relationship: Goal: Ability to interact with others will improve Outcome: Not Progressing Patient is isolative this morning and is in his room.  Problem: Safety: Goal: Ability to remain free from injury will improve Outcome: Progressing Patient remains safe on the unit and is on q15 min safety checks.

## 2016-03-26 NOTE — Progress Notes (Signed)
Patient increasingly confused this afternoon. Strip sheets of both beds placing them in brown paper bags. Also observed to be naked in room. Did put on a shirt with staff prompting however remains unclothed otherwise. Exhibits confused thinking stating, "those clothes are dirty." Clothes had been washed early afternoon per his request and patient aware that they had been laundered.  Redirection given and kind limits set. Medicated per orders and prn zyprexa given without difficulty.   Patient cooperative and redirectable. Currently eating dinner in room. Will continue to monitor closely.

## 2016-03-26 NOTE — Progress Notes (Signed)
Nursing Progress Note 7a-7p  D) Patient presents flat, blank and irritable this morning. Patient reports depression 0/10, hopelessness 0/10, and anxiety 0/10. Patient has been isolative in his room this morning. Patient has been anxious about the cleanliness and germs in his bathroom. Patient states goal is to "reach for the stars" and "keep his appointments". Patient denies SI/HI/AVH or pain. Patient contracts for safety at this time. Patient refused 8 AM medications.  A) Nurse educated patient about need for medication and benefits of compliance. Nurse encouraged patient to take medicines. MD notified. Patient on 15min safety checks. Opportunities for questions or concerns presented to patient. Patient encouraged to continue to attend groups.  R) Patient remains safe on the unit at this time.

## 2016-03-26 NOTE — [facility] Group Notes (Signed)
[facility] LCSW Group Therapy  03/26/2016  1:05 PM  Type of Therapy:  Group therapy  Participation Level:  Active  Participation Quality:  Attentive  Affect:  Flat  Cognitive:  Oriented  Insight:  Limited  Engagement in Therapy:  Limited  Modes of Intervention:  Discussion, Socialization  Summary of Progress/Problems:  Chaplain was here to lead a group on themes of hope and courage.Invited.  Chose to not attend.  Daryel Geraldorth, Johncharles Fusselman B 03/26/2016 12:52 PM

## 2016-03-26 NOTE — [facility] Group Notes (Signed)
Adult Psychoeducational Group Note  Date:  03/26/2016 Time:  10:28 PM  Group Topic/Focus:  Wrap-Up Group:   The focus of this group is to help patients review their daily goal of treatment and discuss progress on daily workbooks.   Participation Level:  Minimal  Participation Quality:  Appropriate  Affect:  Appropriate  Cognitive:  Confused  Insight:  Good  Engagement in Group:  Engaged  Modes of Intervention:  Discussion  Additional Comments:  Patient attended and participated in group.  Today, patient's were asked "If you could have any super power, what would it be?".  Patient identified his super power as "superman's".  Patient rates his day a "10" with 10 being the best and states "I stayed in my room all day and got clean" as the best part of his day.  Larry SierrasMiddleton, Revanth Neidig P 03/26/2016, 10:28 PM

## 2016-03-26 NOTE — Progress Notes (Signed)
Recreation Therapy Notes  Date: 03/26/16 Time: 1000 Location: 500 Hall Dayroom  Group Topic: Communication  Goal Area(s) Addresses:  Patient will effectively communicate with peers in group.  Patient will verbalize benefit of healthy communication. Patient will verbalize positive effect of healthy communication on post d/c goals.  Patient will identify communication techniques that made activity effective for group.   Intervention:  Dione PloverJenga, cards, UNO, checkers, tables and chairs   Activity: Leisure Stations.  LRT set up activity stations in the dayroom with activities such as UNO, Jenga, spades and checkers.  Patients were to pick an activity to participate in.  Patients were to use problem solving, coordination and communication skills to complete the activities.  Education: Communication, Discharge Planning  Education Outcome: Acknowledges understanding/In group clarification offered/Needs additional education.   Clinical Observations/Feedback: Pt did not attend group.   Caroll RancherMarjette Trevell Pariseau, LRT/CTRS         Caroll RancherLindsay, Hayward Rylander A 03/26/2016 12:50 PM

## 2016-03-27 LAB — GLUCOSE, CAPILLARY
GLUCOSE-CAPILLARY: 94 mg/dL (ref 65–99)
GLUCOSE-CAPILLARY: 131 mg/dL — AB (ref 65–99)
GLUCOSE-CAPILLARY: 196 mg/dL — AB (ref 65–99)
Glucose-Capillary: 136 mg/dL — ABNORMAL HIGH (ref 65–99)

## 2016-03-27 MED ORDER — LORAZEPAM 0.5 MG PO TABS
0.5000 mg | ORAL_TABLET | Freq: Every day | ORAL | Status: DC
  Administered 2016-03-27: 0.5 mg via ORAL
  Filled 2016-03-27: qty 1

## 2016-03-27 NOTE — Progress Notes (Signed)
D: Pt was in his room, partially undressed and standing behind the door upon initial approach.  Pt presents with preoccupied affect and anxious, preoccupied mood.  His interactions are bizarre and his speech is tangential.  He reports his day was "all right" and his goal is "getting home."  Pt denies SI/HI, denies pain.  When asked if he is having hallucinations, pt states "April in here having sex with everybody."  Pt reports April is someone he was in a relationship with over a year ago.  He asks "can you bring April some meds?"  Pt has been visible in milieu; he paces the hallway at times.  Pt attended evening group.    A: Introduced self to pt.  Actively listened to pt and offered support and encouragement.  Provided pt with scrub pants and encouraged him to wear them.  Pt was compliant. Medications administered per order.    R: Pt is safe on the unit.  Pt is compliant with medications.  Pt verbally contracts for safety.  Will continue to monitor and assess.

## 2016-03-27 NOTE — [facility] Group Notes (Signed)
Adult Group Therapy Note  Date:  03/27/2016  Time: 11:15AM-12:00PM  Group Topic/Focus: Today's group focused on the topic of fear and healthy coping skills.  An exercise was performed which elicited sources of fear that various patients feel, giving an opportunity for other patients to identify with that fear.  After a discussion of each, a coping skill was named that could be attempted in the future when such a fear arises.    Participation Level:  None  Participation Quality:  Inattentive  Affect:  Irritable  Cognitive:  Confused and Disorganized  Insight: None  Engagement in Group:  None  Modes of Intervention:  Activity, Discussion and Support  Additional Comments:  The patient expressed that his fear is "not being married to you because you didn't say yes" and was unable to think of anything else.  He was in and out of the room, but did not participate in the discussion.  Carloyn JaegerMareida J Grossman-Orr 03/27/2016 , 1:28 PM

## 2016-03-27 NOTE — Progress Notes (Signed)
D: Patient alert and oriented x 3. Patient denies SI/HI/AVH. Patient was in room most of evening. Patient would come out of room and pace the hallway. Patient would not verify date of birth. Other form of verification was used.  A: Staff to monitor Q 15 mins for safety. Encouragement and support offered. Scheduled medications administered per orders. R: Patient remains safe on the unit. Patient did not attended group tonight. Patient visible on the unit. Patient taking administered medications.

## 2016-03-27 NOTE — Progress Notes (Signed)
Pt did not attend group meeting this evening.  

## 2016-03-27 NOTE — Progress Notes (Signed)
Ronald Roman Progress Note  03/27/2016 11:12 AM Ronald Roman  MRN:  [id number] Subjective: Patient reports " I am OK."      Objective: Ronald Roman is a 55 year old AA male with history ofparanoid schizophrenia, DM, HTN,who was found walking naked by neighbors, recommended by his case manager to be brought to the hospital for evaluation.  Patient seen and chart reviewed.Discussed patient with treatment team.  Patient today is seen as less guarded , response to questions continues to be in monosyllables , but appropriate. Pt with improved sleep . Good response to Doxepin. Pt however continues to have periods when he is disorganized , delusional, is seen as talking about goblins and ghosts in his room. Pt per nursing pt continues to have periods when he is seen as stripping in his room or walking around with a brown paper bag. Will continue to readjust medications. Support , reassurance provided.       Principal Problem: Paranoid schizophrenia (HCC) Diagnosis:   Patient Active Problem List   Diagnosis Date Noted  . Acute psychosis [F23]   . Leukocytosis [D72.829] 02/06/2015  . Sleep apnea [G47.30] 02/05/2015  . AKI (acute kidney injury) (HCC) [N17.9] 02/04/2015  . Seizure disorder (HCC) [G40.909] 02/04/2015  . Acute encephalopathy [G93.40] 02/04/2015  . Diabetes mellitus without complication (HCC) [E11.9]   . Hyperlipidemia [E78.5]   . Hypertension [I10]   . Paranoid schizophrenia (HCC) [F20.0]    Total Time spent with patient: 25 minutes  Past Psychiatric History: Please see H&P.   Past Medical History:  Past Medical History:  Diagnosis Date  . Diabetes mellitus without complication (HCC)   . GERD (gastroesophageal reflux disease)   . Hyperlipidemia   . Hypertension   . Paranoid schizophrenia (HCC)   . Sleep apnea   . Uric acid nephrolithiasis    History reviewed. No pertinent surgical history. Family History: Patient denies Family Psychiatric  History: Pt  denies Social History: Patient reports he lives in [place] , is single , has children who does not live with him , reports he gets food stamps . History  Alcohol Use No     History  Drug Use No    Social History   Social History  . Marital status: Single    Spouse name: N/A  . Number of children: N/A  . Years of education: N/A   Social History Main Topics  . Smoking status: Current Every Day Smoker    Packs/day: 1.00    Types: Cigarettes  . Smokeless tobacco: Never Used  . Alcohol use No  . Drug use: No  . Sexual activity: Not Currently   Other Topics Concern  . None   Social History Narrative  . None   Additional Social History:                         Sleep: Fair  Appetite:  Fair  Current Medications: Current Facility-Administered Medications  Medication Dose Route Frequency Provider Last Rate Last Dose  . acetaminophen (TYLENOL) tablet 650 mg  650 mg Oral Q6H PRN Kristeen Mans, NP   650 mg at 03/21/16 0329  . amantadine (SYMMETREL) capsule 100 mg  100 mg Oral BID Laveda Abbe, NP   100 mg at 03/27/16 0755  . divalproex (DEPAKOTE ER) 24 hr tablet 1,250 mg  1,250 mg Oral QHS Jomarie Longs, Roman   1,250 mg at 03/26/16 2107  . doxepin (SINEQUAN) capsule 25 mg  25 mg Oral QHS Jomarie LongsSaramma Alishba Naples, Roman   25 mg at 03/26/16 2107  . insulin aspart (novoLOG) injection 0-9 Units  0-9 Units Subcutaneous TID WC Laveda AbbeLaurie Britton Parks, NP   1 Units at 03/27/16 [phone]  . insulin glargine (LANTUS) injection 10 Units  10 Units Subcutaneous QHS Laveda AbbeLaurie Britton Parks, NP   10 Units at 03/26/16 2109  . lisinopril (PRINIVIL,ZESTRIL) tablet 10 mg  10 mg Oral Daily Kristeen MansFran E Hobson, NP   10 mg at 03/27/16 0755  . LORazepam (ATIVAN) tablet 0.5 mg  0.5 mg Oral QHS Darnelle Derrick, Roman      . magnesium hydroxide (MILK OF MAGNESIA) suspension 30 mL  30 mL Oral Daily Kerry HoughSpencer E Simon, PA-C   30 mL at 03/27/16 0755  . nicotine polacrilex (NICORETTE) gum 2 mg  2 mg Oral PRN Jomarie LongsSaramma Wanell Lorenzi,  Roman      . OLANZapine (ZYPREXA) tablet 5 mg  5 mg Oral TID PRN Jomarie LongsSaramma Wyman Meschke, Roman   5 mg at 03/27/16 0318   Or  . OLANZapine (ZYPREXA) injection 5 mg  5 mg Intramuscular TID PRN Jomarie LongsSaramma Janila Arrazola, Roman      . paliperidone (INVEGA) 24 hr tablet 9 mg  9 mg Oral QHS Jomarie LongsSaramma Krystalyn Kubota, Roman   9 mg at 03/26/16 2107    Lab Results:  Results for orders placed or performed during the hospital encounter of 03/19/16 (from the past 48 hour(s))  Glucose, capillary     Status: Abnormal   Collection Time: 03/25/16 11:39 AM  Result Value Ref Range   Glucose-Capillary 110 (H) 65 - 99 mg/dL   Comment 1 Notify RN    Comment 2 Document in Chart   Glucose, capillary     Status: Abnormal   Collection Time: 03/25/16  4:54 PM  Result Value Ref Range   Glucose-Capillary 120 (H) 65 - 99 mg/dL   Comment 1 Notify RN    Comment 2 Document in Chart   Glucose, capillary     Status: Abnormal   Collection Time: 03/25/16  8:16 PM  Result Value Ref Range   Glucose-Capillary 151 (H) 65 - 99 mg/dL  Glucose, capillary     Status: Abnormal   Collection Time: 03/26/16  5:45 AM  Result Value Ref Range   Glucose-Capillary 140 (H) 65 - 99 mg/dL  Glucose, capillary     Status: Abnormal   Collection Time: 03/26/16 12:03 PM  Result Value Ref Range   Glucose-Capillary 117 (H) 65 - 99 mg/dL  Glucose, capillary     Status: Abnormal   Collection Time: 03/26/16  5:08 PM  Result Value Ref Range   Glucose-Capillary 104 (H) 65 - 99 mg/dL  Glucose, capillary     Status: Abnormal   Collection Time: 03/26/16  8:42 PM  Result Value Ref Range   Glucose-Capillary 166 (H) 65 - 99 mg/dL  Glucose, capillary     Status: Abnormal   Collection Time: 03/27/16  6:21 AM  Result Value Ref Range   Glucose-Capillary 136 (H) 65 - 99 mg/dL    Blood Alcohol level:  Lab Results  Component Value Date   ETH <5 03/18/2016   ETH <5 03/01/2016    Metabolic Disorder Labs: Lab Results  Component Value Date   HGBA1C 8.0 (H) 03/23/2016   MPG 183  03/23/2016   MPG 183 03/20/2016   Lab Results  Component Value Date   PROLACTIN 31.5 (H) 03/23/2016   PROLACTIN 36.6 (H) 03/20/2016   Lab Results  Component Value  Date   CHOL 145 03/23/2016   TRIG 144 03/23/2016   HDL 30 (L) 03/23/2016   CHOLHDL 4.8 03/23/2016   VLDL 29 03/23/2016   LDLCALC 86 03/23/2016   LDLCALC 21 03/20/2016    Physical Findings: AIMS: Facial and Oral Movements Muscles of Facial Expression: None, normal Lips and Perioral Area: None, normal Jaw: None, normal Tongue: None, normal,Extremity Movements Upper (arms, wrists, hands, fingers): None, normal Lower (legs, knees, ankles, toes): None, normal, Trunk Movements Neck, shoulders, hips: None, normal, Overall Severity Severity of abnormal movements (highest score from questions above): None, normal Incapacitation due to abnormal movements: None, normal Patient's awareness of abnormal movements (rate only patient's report): No Awareness, Dental Status Current problems with teeth and/or dentures?: No Does patient usually wear dentures?: No  CIWA:    COWS:     Musculoskeletal: Strength & Muscle Tone: within normal limits Gait & Station: normal Patient leans: N/A  Psychiatric Specialty Exam: Physical Exam  Nursing note and vitals reviewed.   Review of Systems  Psychiatric/Behavioral: Positive for depression. The patient is nervous/anxious.   All other systems reviewed and are negative.   Blood pressure 117/69, pulse (!) 104, temperature 98.8 F (37.1 C), temperature source Oral, resp. rate 17, height 5' 5.5" (1.664 m), weight 91.6 kg (202 lb), SpO2 100 %.Body mass index is 33.1 kg/m.  General Appearance: Casual and Guarded  Eye Contact:  Fair  Speech:  Blocked and Slow improving  Volume:  Decreased  Mood:  Anxious and Depressed improving  Affect:  Inappropriate smiles to self often  Thought Process:  Disorganized, Irrelevant and Descriptions of Associations: Circumstantial improving, on and off   Orientation:  Full (Time, Place, and Person)  Thought Content:  Delusions, Hallucinations: Auditory, Paranoid Ideation, Rumination and Tangential some improvement- denies any AH today - but continues to be delusional  Suicidal Thoughts:  No but is delusional and disorganized  Homicidal Thoughts:  No  Memory:  Immediate;   Fair Recent;   Fair Remote;   Fair  Judgement:  Impaired  Insight:  Shallow  Psychomotor Activity:  Restlessness  Concentration:  Concentration: Fair and Attention Span: Poor  Recall:  Poor  Fund of Knowledge:  Poor  Language:  Fair  Akathisia:  No  Handed:  Right  AIMS (if indicated):     Assets:  Desire for Improvement Resilience  ADL's:  Intact  Cognition:  WNL  Sleep:  Number of Hours: 2.25     Treatment Plan Summary: Ronald Roman is a 55 year old AA male with history ofparanoid schizophrenia, DM, HTN,who was found walking naked by neighbors, recommended by his case manager to be brought to the hospital for evaluation.  Patient today seen as improving , sleep is better ,continues to have delusions , paranoid thoughts ,  will continue treatment.   Will continue today 03/27/16 plan as below except where it is noted.  Daily contact with patient to assess and evaluate symptoms and progress in treatment and Medication management    Increased Invega to 9 mg po qhs for psychosis.Will titrate up slowly. Will offer Invega sustenna IM prior to discharge. Pt agrees with plan. Increased  Depakote  ER to 1250 mg po qhs for mood lability , although  Depakote level  - 03/25/16- 72 ( therapeutic) , next depakote level on 03/30/16. Will continue Doxepin  25 mg po qhs for sleep. Will reduce Ativan to 0.5mg  po qhs for anxiety . Will taper down slowly once he is more stable. Will  continue to monitor vitals ,medication compliance and treatment side effects while patient is here.  Reviewed labs: EKG - qtc - wnl, TSH- wnl , hba1c- 8- dietician consult once patient is more  stable  ,  pl -31.5 ,  lipid panel HDL - low - diet control recommendations to be provided when patient is more stable. CSW will continue  working on disposition.  Patient to participate in therapeutic milieu Shahed Yeoman, Roman 03/27/2016, 11:12 AM

## 2016-03-27 NOTE — Progress Notes (Signed)
D: Patient's self inventory sheet: patient has good sleep, recieved sleep medication.good  Appetite, hyper energy level, good concentration. Rated depression 0/10, hopeless no/10, anxiety 0/10. SI/HI/AVH: States that thoughts about suicide/hurting self are  "almost all the time". Physical complaints are blurred vision. Goal is "comprehensive". Plans to work on "listen ing to music". Patient paces and interacts minimally. Restless.    A: Medications administered, assessed medication knowledge and education given on medication regimen.  Emotional support and encouragement given patient. R: Reports SI and denies HI , contracts for safety. Safety maintained with 15 minute checks.

## 2016-03-27 NOTE — Progress Notes (Signed)
Adult Psychoeducational Group Note  Date:  03/27/2016 Time:  11:00 AM  Group Topic/Focus:  Making Healthy Choices:   The focus of this group is to help patients identify negative/unhealthy choices they were using prior to admission and identify positive/healthier coping strategies to replace them upon discharge.   Participation Level:  Active  Participation Quality:  Appropriate and Attentive  Affect:  Appropriate  Cognitive:  Alert and Appropriate  Insight: Appropriate and Good  Engagement in Group:  Engaged  Modes of Intervention:  Discussion and Education   Mickie Baillizabeth O Iwenekha 03/27/2016, 11:00 AM

## 2016-03-28 LAB — GLUCOSE, CAPILLARY
GLUCOSE-CAPILLARY: 165 mg/dL — AB (ref 65–99)
GLUCOSE-CAPILLARY: 199 mg/dL — AB (ref 65–99)
Glucose-Capillary: 104 mg/dL — ABNORMAL HIGH (ref 65–99)
Glucose-Capillary: 175 mg/dL — ABNORMAL HIGH (ref 65–99)

## 2016-03-28 MED ORDER — LORAZEPAM 1 MG PO TABS
1.0000 mg | ORAL_TABLET | Freq: Every day | ORAL | Status: DC
  Administered 2016-03-28 – 2016-04-04 (×8): 1 mg via ORAL
  Filled 2016-03-28 (×8): qty 1

## 2016-03-28 MED ORDER — DOXEPIN HCL 10 MG PO CAPS
30.0000 mg | ORAL_CAPSULE | Freq: Every day | ORAL | Status: DC
  Administered 2016-03-28 – 2016-03-29 (×2): 30 mg via ORAL
  Filled 2016-03-28 (×5): qty 3

## 2016-03-28 MED ORDER — PALIPERIDONE ER 6 MG PO TB24
12.0000 mg | ORAL_TABLET | Freq: Every day | ORAL | Status: DC
  Administered 2016-03-28 – 2016-03-31 (×4): 12 mg via ORAL
  Filled 2016-03-28 (×6): qty 2

## 2016-03-28 NOTE — Progress Notes (Signed)
D: Patient's self inventory sheet: patient has good sleep, recieved sleep medication.good  Appetite, (did not reply about energy level), good concentration. Rated depression 0/10, hopeless 0/10, anxiety 0/10. SI/HI/AVH: Denies. Physical complaints are denies. Goal is "home". Plans to work on "here was the place now its different". Pt is tangential with disorganized speech. Isolative but pacing.   A: Medications administered, assessed medication knowledge and education given on medication regimen.  Emotional support and encouragement given patient. R: Denies SI and HI , contracts for safety. Safety maintained with 15 minute checks.

## 2016-03-28 NOTE — Plan of Care (Signed)
Problem: Safety: Goal: Ability to remain free from injury will improve Outcome: Progressing Pt is safe and free from injury

## 2016-03-28 NOTE — Progress Notes (Signed)
[facility]  03/28/2016 12:14 PM Ronald AngstBarry D Roman  MRN:  [id number] Subjective: Patient reports " I am fine."       Objective: Ronald Roman is a 55 year old AA male with history ofparanoid schizophrenia, DM, HTN,who was found walking naked by neighbors, recommended by his case manager to be brought to the hospital for evaluation.  Patient seen and chart reviewed.Discussed patient with treatment team.  Patient today is seen as calm , response to questions are appropriate , denies any new concerns. Pt is limited historian and hence unable to get more information about how he feels and his sleep and so on. He usually answers " OK " and denies any new concerns . Per staff - pt continues to be delusional , and had very poor sleep last night . Support , encouragement provided .      Principal Problem: Paranoid schizophrenia (HCC) Diagnosis:   Patient Active Problem List   Diagnosis Date Noted  . Acute psychosis [F23]   . Leukocytosis [D72.829] 02/06/2015  . Sleep apnea [G47.30] 02/05/2015  . AKI (acute kidney injury) (HCC) [N17.9] 02/04/2015  . Seizure disorder (HCC) [G40.909] 02/04/2015  . Acute encephalopathy [G93.40] 02/04/2015  . Diabetes mellitus without complication (HCC) [E11.9]   . Hyperlipidemia [E78.5]   . Hypertension [I10]   . Paranoid schizophrenia (HCC) [F20.0]    Total Time spent with patient: 25 minutes  Past Psychiatric History: Please see H&P.   Past Medical History:  Past Medical History:  Diagnosis Date  . Diabetes mellitus without complication (HCC)   . GERD (gastroesophageal reflux disease)   . Hyperlipidemia   . Hypertension   . Paranoid schizophrenia (HCC)   . Sleep apnea   . Uric acid nephrolithiasis    History reviewed. No pertinent surgical history. Family History: Patient denies Family Psychiatric  History: Pt denies Social History: Patient reports he lives in [place] , is single , has children who does not live with him ,  reports he gets food stamps . History  Alcohol Use No     History  Drug Use No    Social History   Social History  . Marital status: Single    Spouse name: N/A  . Number of children: N/A  . Years of education: N/A   Social History Main Topics  . Smoking status: Current Every Day Smoker    Packs/day: 1.00    Types: Cigarettes  . Smokeless tobacco: Never Used  . Alcohol use No  . Drug use: No  . Sexual activity: Not Currently   Other Topics Concern  . None   Social History Narrative  . None   Additional Social History:                         Sleep: Poor  Appetite:  Fair  Current Medications: Current Facility-Administered Medications  Medication Dose Route Frequency Provider Last Rate Last Dose  . acetaminophen (TYLENOL) tablet 650 mg  650 mg Oral Q6H PRN Kristeen MansFran E Hobson, NP   650 mg at 03/21/16 0329  . amantadine (SYMMETREL) capsule 100 mg  100 mg Oral BID Laveda AbbeLaurie Britton Parks, NP   100 mg at 03/28/16 0747  . divalproex (DEPAKOTE ER) 24 hr tablet 1,250 mg  1,250 mg Oral QHS Jomarie LongsSaramma Javanni Maring, MD   1,250 mg at 03/27/16 2112  . doxepin (SINEQUAN) capsule 30 mg  30 mg Oral QHS Jomarie LongsSaramma Perrin Eddleman, MD      .  insulin aspart (novoLOG) injection 0-9 Units  0-9 Units Subcutaneous TID WC Laveda Abbe, NP   2 Units at 03/28/16 [phone]  . insulin glargine (LANTUS) injection 10 Units  10 Units Subcutaneous QHS Laveda Abbe, NP   10 Units at 03/27/16 2112  . lisinopril (PRINIVIL,ZESTRIL) tablet 10 mg  10 mg Oral Daily Kristeen Mans, NP   10 mg at 03/28/16 0747  . LORazepam (ATIVAN) tablet 1 mg  1 mg Oral QHS Tulsi Crossett, MD      . magnesium hydroxide (MILK OF MAGNESIA) suspension 30 mL  30 mL Oral Daily Kerry Hough, PA-C   30 mL at 03/28/16 0747  . nicotine polacrilex (NICORETTE) gum 2 mg  2 mg Oral PRN Jomarie Longs, MD      . OLANZapine (ZYPREXA) tablet 5 mg  5 mg Oral TID PRN Jomarie Longs, MD   5 mg at 03/27/16 2114   Or  . OLANZapine (ZYPREXA)  injection 5 mg  5 mg Intramuscular TID PRN Jomarie Longs, MD      . paliperidone (INVEGA) 24 hr tablet 12 mg  12 mg Oral QHS Jomarie Longs, MD        Lab Results:  Results for orders placed or performed during the hospital encounter of 03/19/16 (from the past 48 hour(s))  Glucose, capillary     Status: Abnormal   Collection Time: 03/26/16  5:08 PM  Result Value Ref Range   Glucose-Capillary 104 (H) 65 - 99 mg/dL  Glucose, capillary     Status: Abnormal   Collection Time: 03/26/16  8:42 PM  Result Value Ref Range   Glucose-Capillary 166 (H) 65 - 99 mg/dL  Glucose, capillary     Status: Abnormal   Collection Time: 03/27/16  6:21 AM  Result Value Ref Range   Glucose-Capillary 136 (H) 65 - 99 mg/dL  Glucose, capillary     Status: None   Collection Time: 03/27/16 11:20 AM  Result Value Ref Range   Glucose-Capillary 94 65 - 99 mg/dL  Glucose, capillary     Status: Abnormal   Collection Time: 03/27/16  5:14 PM  Result Value Ref Range   Glucose-Capillary 131 (H) 65 - 99 mg/dL   Comment 1 Notify RN    Comment 2 Document in Chart   Glucose, capillary     Status: Abnormal   Collection Time: 03/27/16 10:29 PM  Result Value Ref Range   Glucose-Capillary 196 (H) 65 - 99 mg/dL  Glucose, capillary     Status: Abnormal   Collection Time: 03/28/16  5:56 AM  Result Value Ref Range   Glucose-Capillary 165 (H) 65 - 99 mg/dL   Comment 1 Notify RN   Glucose, capillary     Status: Abnormal   Collection Time: 03/28/16 11:53 AM  Result Value Ref Range   Glucose-Capillary 104 (H) 65 - 99 mg/dL    Blood Alcohol level:  Lab Results  Component Value Date   ETH <5 03/18/2016   ETH <5 03/01/2016    Metabolic Disorder Labs: Lab Results  Component Value Date   HGBA1C 8.0 (H) 03/23/2016   MPG 183 03/23/2016   MPG 183 03/20/2016   Lab Results  Component Value Date   PROLACTIN 31.5 (H) 03/23/2016   PROLACTIN 36.6 (H) 03/20/2016   Lab Results  Component Value Date   CHOL 145 03/23/2016    TRIG 144 03/23/2016   HDL 30 (L) 03/23/2016   CHOLHDL 4.8 03/23/2016   VLDL 29 03/23/2016  LDLCALC 86 03/23/2016   LDLCALC 21 03/20/2016    Physical Findings: AIMS: Facial and Oral Movements Muscles of Facial Expression: None, normal Lips and Perioral Area: None, normal Jaw: None, normal Tongue: None, normal,Extremity Movements Upper (arms, wrists, hands, fingers): None, normal Lower (legs, knees, ankles, toes): None, normal, Trunk Movements Neck, shoulders, hips: None, normal, Overall Severity Severity of abnormal movements (highest score from questions above): None, normal Incapacitation due to abnormal movements: None, normal Patient's awareness of abnormal movements (rate only patient's report): No Awareness, Dental Status Current problems with teeth and/or dentures?: No Does patient usually wear dentures?: No  CIWA:    COWS:     Musculoskeletal: Strength & Muscle Tone: within normal limits Gait & Station: normal Patient leans: N/A  Psychiatric Specialty Exam: Physical Exam  Nursing note and vitals reviewed.   Review of Systems  Psychiatric/Behavioral: Positive for depression. The patient is nervous/anxious.   All other systems reviewed and are negative.   Blood pressure 100/85, pulse (!) 105, temperature 98.6 F (37 C), temperature source Oral, resp. rate 17, height 5' 5.5" (1.664 m), weight 91.6 kg (202 lb), SpO2 100 %.Body mass index is 33.1 kg/m.  General Appearance: Casual and Guarded  Eye Contact:  Fair  Speech:  Blocked and Slow improving  Volume:  Decreased  Mood:  Anxious and Depressed improving  Affect:  Inappropriate smiles to self on and off   Thought Process:  Disorganized, Irrelevant and Descriptions of Associations: Circumstantial improving, on and off  Orientation:  Full (Time, Place, and Person)  Thought Content:  Delusions, Hallucinations: Auditory, Paranoid Ideation, Rumination and Tangential some improvement- denies any AH today - but  continues to be delusional often about spirits monsters and goblins  Suicidal Thoughts:  No but is delusional and disorganized  Homicidal Thoughts:  No  Memory:  Immediate;   Fair Recent;   Fair Remote;   Fair  Judgement:  Impaired  Insight:  Shallow  Psychomotor Activity:  Restlessness  Concentration:  Concentration: Fair and Attention Span: Poor  Recall:  Poor  Fund of Knowledge:  Poor  Language:  Fair  Akathisia:  No  Handed:  Right  AIMS (if indicated):     Assets:  Desire for Improvement Resilience  ADL's:  Intact  Cognition:  WNL  Sleep:  Number of Hours: 1     Treatment Plan Summary: Ronald Roman is a 55 year old AA male with history ofparanoid schizophrenia, DM, HTN,who was found walking naked by neighbors, recommended by his case manager to be brought to the hospital for evaluation.  Patient today with sleep issues , had a restless night - continues to be delusional. Will continue treatment.   Will continue today 03/28/16 plan as below except where it is noted.  Daily contact with patient to assess and evaluate symptoms and progress in treatment and Medication management    Will increase  Invega to 12 mg po qhs for psychosis. Will offer Invega sustenna IM prior to discharge. Pt agrees with plan. Increased  Depakote  ER to 1250 mg po qhs for mood lability , although  Depakote level  - 03/25/16- 72 ( therapeutic) , next depakote level on 03/30/16. Will increase  Doxepin to 30 mg po qhs for sleep. Will increase Ativan to 1 mg po qhs for anxiety .Patient did not sleep at all last night. Will continue to monitor vitals ,medication compliance and treatment side effects while patient is here.  Reviewed labs: EKG - qtc - wnl, TSH- wnl ,  hba1c- 8- dietician consult once patient is more stable  ,  pl -31.5 ,  lipid panel HDL - low - diet control recommendations to be provided when patient is more stable. CSW will continue  working on disposition.  Patient to participate in  therapeutic milieu Shenouda Genova, MD 03/28/2016, 12:14 PM

## 2016-03-28 NOTE — Plan of Care (Signed)
Problem: Safety: Goal: Ability to remain free from injury will improve Outcome: Progressing Patient remains free of injury. Q 15 min check will continue

## 2016-03-28 NOTE — [facility] Group Notes (Signed)
[facility] Group Notes:  (Clinical Social Work)  03/28/2016  11:00AM-12:00PM  Summary of Progress/Problems:  The main focus of today's process group was to listen to a variety of genres of music and to identify that different types of music provoke different responses.  The patient then was able to identify personally what was soothing for them, as well as energizing, as well as how patient can personally use this knowledge in sleep habits, with depression, and with other symptoms.  The patient expressed at the beginning of group the overall feeling of "good."  He was in and out of the room about 4 times, but stayed longer inside group today than he has been doing.  He sang along with much of the music, danced in his chair.  Type of Therapy:  Music Therapy   Participation Level:  Active  Participation Quality:  Attentive   Affect:  Blunted  Cognitive:  Disorganized  Insight:  Improving  Engagement in Therapy:  Improving  Modes of Intervention:   Activity, Exploration  Ambrose MantleMareida Grossman-Orr, LCSW 03/28/2016

## 2016-03-28 NOTE — Plan of Care (Signed)
Problem: Safety: Goal: Periods of time without injury will increase Outcome: Progressing No self harm behaviors noted.

## 2016-03-28 NOTE — Progress Notes (Signed)
Patient ID: Ronald AngstBarry D Roman, male   DOB: [birth year], 55 y.o.   MRN: [id number]  D: Patient pacing in hallway on approach. Pt reports having a good day and tolerating medication well. Denies  SI/HI/AVH and pain.No behavioral issues noted.  A: Support and encouragement offered as needed. Medications administered as prescribed.  R: Patient safe and cooperative on unit. Will continue to monitor patient for safety and stability.

## 2016-03-28 NOTE — [facility] Group Notes (Signed)
Patient did not attend nurse psychoeducational group.  

## 2016-03-29 LAB — GLUCOSE, CAPILLARY
GLUCOSE-CAPILLARY: 159 mg/dL — AB (ref 65–99)
GLUCOSE-CAPILLARY: 237 mg/dL — AB (ref 65–99)
Glucose-Capillary: 150 mg/dL — ABNORMAL HIGH (ref 65–99)
Glucose-Capillary: 269 mg/dL — ABNORMAL HIGH (ref 65–99)

## 2016-03-29 NOTE — Progress Notes (Signed)
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 Seen resting in bedroom. Patient reports he is waiting to go to work today.  Denies suicidal or homicidal ideation. Patient appears to be responding to internal stimuli. Patient continues to be isolated, disorganized and guarded.Patient reports he is medication compliant without mediation side effects. Patient answers questions with yes or no responses. Support, encouragement and reassurance was provided.       Principal Problem: Paranoid schizophrenia (HCC) Diagnosis:   Patient Active Problem List   Diagnosis Date Noted  . Acute psychosis [F23]   . Leukocytosis [D72.829] 02/06/2015  . Sleep apnea [G47.30] 02/05/2015  . AKI (acute kidney injury) (HCC) [N17.9] 02/04/2015  . Seizure disorder (HCC) [G40.909] 02/04/2015  . Acute encephalopathy [G93.40] 02/04/2015  . Diabetes mellitus without complication (HCC) [E11.9]   . Hyperlipidemia [E78.5]   . Hypertension [I10]   . Paranoid schizophrenia (HCC) [F20.0]    Total Time spent with patient: 25 minutes  Past Psychiatric History: Please see H&P.   Past Medical History:  Past Medical History:  Diagnosis Date  . Diabetes mellitus without complication (HCC)   . GERD (gastroesophageal reflux disease)   . Hyperlipidemia   . Hypertension   . Paranoid schizophrenia (HCC)   . Sleep apnea   . Uric acid nephrolithiasis    History reviewed. No pertinent surgical history. Family History: Patient denies Family Psychiatric  History: Pt denies Social History: Patient reports he [place] in [place] , is single , has children who does not live with him , reports he gets food stamps . History  Alcohol Use No     History  Drug Use No    Social History   Social History  . Marital status:  Single    Spouse name: N/A  . Number of children: N/A  . Years of education: N/A   Social History Main Topics  . Smoking status: Current Every Day Smoker    Packs/day: 1.00    Types: Cigarettes  . Smokeless tobacco: Never Used  . Alcohol use No  . Drug use: No  . Sexual activity: Not Currently   Other Topics Concern  . None   Social History Narrative  . None   Additional Social History:                         Sleep: Poor  Appetite:  Fair  Current Medications: Current Facility-Administered Medications  Medication Dose Route Frequency Provider Last Rate Last Dose  . acetaminophen (TYLENOL) tablet 650 mg  650 mg Oral Q6H PRN Kristeen Mans, NP   650 mg at 03/21/16 0329  . amantadine (SYMMETREL) capsule 100 mg  100 mg Oral BID Laveda Abbe, NP   100 mg at 03/29/16 0813  . divalproex (DEPAKOTE ER) 24 hr tablet 1,250 mg  1,250 mg Oral QHS Jomarie Longs, MD   1,250 mg at 03/28/16 2120  . doxepin (SINEQUAN) capsule 30 mg  30 mg Oral QHS Jomarie Longs, MD   30 mg at 03/28/16 2120  . insulin aspart (novoLOG) injection 0-9 Units  0-9 Units Subcutaneous TID WC Laveda Abbe, NP   2 Units at 03/29/16 0636  . insulin glargine (LANTUS) injection 10 Units  10  Units Subcutaneous QHS Laveda AbbeLaurie Britton Parks, NP   10 Units at 03/28/16 2124  . lisinopril (PRINIVIL,ZESTRIL) tablet 10 mg  10 mg Oral Daily Kristeen MansFran E Hobson, NP   10 mg at 03/29/16 0813  . LORazepam (ATIVAN) tablet 1 mg  1 mg Oral QHS Jomarie LongsSaramma Eappen, MD   1 mg at 03/28/16 2120  . magnesium hydroxide (MILK OF MAGNESIA) suspension 30 mL  30 mL Oral Daily Kerry HoughSpencer E Simon, PA-C   30 mL at 03/28/16 0747  . nicotine polacrilex (NICORETTE) gum 2 mg  2 mg Oral PRN Jomarie LongsSaramma Eappen, MD      . OLANZapine (ZYPREXA) tablet 5 mg  5 mg Oral TID PRN Jomarie LongsSaramma Eappen, MD   5 mg at 03/27/16 2114   Or  . OLANZapine (ZYPREXA) injection 5 mg  5 mg Intramuscular TID PRN Jomarie LongsSaramma Eappen, MD      . paliperidone (INVEGA) 24 hr tablet 12 mg   12 mg Oral QHS Jomarie LongsSaramma Eappen, MD   12 mg at 03/28/16 2120    Lab Results:  Results for orders placed or performed during the hospital encounter of 03/19/16 (from the past 48 hour(s))  Glucose, capillary     Status: Abnormal   Collection Time: 03/27/16  5:14 PM  Result Value Ref Range   Glucose-Capillary 131 (H) 65 - 99 mg/dL   Comment 1 Notify RN    Comment 2 Document in Chart   Glucose, capillary     Status: Abnormal   Collection Time: 03/27/16 10:29 PM  Result Value Ref Range   Glucose-Capillary 196 (H) 65 - 99 mg/dL  Glucose, capillary     Status: Abnormal   Collection Time: 03/28/16  5:56 AM  Result Value Ref Range   Glucose-Capillary 165 (H) 65 - 99 mg/dL   Comment 1 Notify RN   Glucose, capillary     Status: Abnormal   Collection Time: 03/28/16 11:53 AM  Result Value Ref Range   Glucose-Capillary 104 (H) 65 - 99 mg/dL  Glucose, capillary     Status: Abnormal   Collection Time: 03/28/16  4:46 PM  Result Value Ref Range   Glucose-Capillary 175 (H) 65 - 99 mg/dL  Glucose, capillary     Status: Abnormal   Collection Time: 03/28/16  8:45 PM  Result Value Ref Range   Glucose-Capillary 199 (H) 65 - 99 mg/dL  Glucose, capillary     Status: Abnormal   Collection Time: 03/29/16  6:16 AM  Result Value Ref Range   Glucose-Capillary 159 (H) 65 - 99 mg/dL    Blood Alcohol level:  Lab Results  Component Value Date   ETH <5 03/18/2016   ETH <5 03/01/2016    Metabolic Disorder Labs: Lab Results  Component Value Date   HGBA1C 8.0 (H) 03/23/2016   MPG 183 03/23/2016   MPG 183 03/20/2016   Lab Results  Component Value Date   PROLACTIN 31.5 (H) 03/23/2016   PROLACTIN 36.6 (H) 03/20/2016   Lab Results  Component Value Date   CHOL 145 03/23/2016   TRIG 144 03/23/2016   HDL 30 (L) 03/23/2016   CHOLHDL 4.8 03/23/2016   VLDL 29 03/23/2016   LDLCALC 86 03/23/2016   LDLCALC 21 03/20/2016    Physical Findings: AIMS: Facial and Oral Movements Muscles of Facial  Expression: None, normal Lips and Perioral Area: None, normal Jaw: None, normal Tongue: None, normal,Extremity Movements Upper (arms, wrists, hands, fingers): None, normal Lower (legs, knees, ankles, toes): None, normal, Trunk Movements Neck, shoulders, hips:  None, normal, Overall Severity Severity of abnormal movements (highest score from questions above): None, normal Incapacitation due to abnormal movements: None, normal Patient's awareness of abnormal movements (rate only patient's report): No Awareness, Dental Status Current problems with teeth and/or dentures?: No Does patient usually wear dentures?: No  CIWA:    COWS:     Musculoskeletal: Strength & Muscle Tone: within normal limits Gait & Station: normal Patient leans: N/A  Psychiatric Specialty Exam: Physical Exam  Nursing note and vitals reviewed. Constitutional: He is oriented to person, place, and time. He appears well-developed.  Cardiovascular: Normal rate.   Neurological: He is alert and oriented to person, place, and time.  Psychiatric: He has a normal mood and affect. His behavior is normal.    Review of Systems  Psychiatric/Behavioral: Positive for depression. The patient is nervous/anxious.   All other systems reviewed and are negative.   Blood pressure 117/77, pulse (!) 114, temperature 98.6 F (37 C), temperature source Oral, resp. rate 17, height 5' 5.5" (1.664 m), weight 91.6 kg (202 lb), SpO2 100 %.Body mass index is 33.1 kg/m.  General Appearance: Casual and Guarded  Eye Contact:  Fair  Speech:  Blocked and Slow   Volume:  Decreased  Mood:  Anxious and Depressed   Affect:  Inappropriate smiles to self on and off   Thought Process:  Disorganized, Irrelevant and Descriptions of Associations: Circumstantial   Orientation:  Full (Time, Place, and Person)  Thought Content:  Delusions, Hallucinations: Auditory, Paranoid Ideation, Rumination and Tangential some improvement- denies any AH today - but  continues to be delusional often about spirits monsters and goblins  Suicidal Thoughts:  No continues to be delusional and disorganized  Homicidal Thoughts:  No  Memory:  Immediate;   Fair Recent;   Fair Remote;   Fair  Judgement:  Impaired  Insight:  Shallow  Psychomotor Activity:  Restlessness  Concentration:  Concentration: Fair and Attention Span: Poor  Recall:  Poor  Fund of Knowledge:  Poor  Language:  Fair  Akathisia:  No  Handed:  Right  AIMS (if indicated):     Assets:  Desire for Improvement Resilience  ADL's:  Intact  Cognition:  WNL  Sleep:  Number of Hours: 1      I agree with current treatment plan on 11/06//2017, Patient seen face-to-face for psychiatric evaluation follow-up, chart reviewed. Reviewed the information documented and agree with the treatment plan.   Treatment Plan Summary:. Will continue today 03/28/16 plan as below except where it is noted. Daily contact with patient to assess and evaluate symptoms and progress in treatment and Medication management   Will contiune  Invega to 12 mg po qhs for psychosis. Will offer Invega sustenna IM prior to discharge. Pt agrees with plan. Increased  Depakote  ER to 1250 mg po qhs for mood lability , although  Depakote level  - 03/25/16- 72 ( therapeutic) , next depakote level on 03/30/16. Will continue Doxepin to 30 mg po qhs for sleep. Will continue Ativan to 1 mg po qhs for anxiety .Patient did not sleep at all last night. Will continue to monitor vitals ,medication compliance and treatment side effects while patient is here.  Reviewed labs: EKG - qtc - wnl, TSH- wnl , hba1c- 8- dietician consult once patient is more stable  ,  pl -31.5 ,  lipid panel HDL - low - diet control recommendations to be provided when patient is more stable. CSW will continue  working on disposition.  Patient to  participate in therapeutic milieu  Oneta Rack, NP 03/29/2016, 11:56 AM   Agree with NP Progress Note as above

## 2016-03-29 NOTE — [facility] Group Notes (Signed)
[facility] LCSW Group Therapy  03/29/2016 1:15 pm  Type of Therapy: Process Group Therapy  Participation Level:  Active  Participation Quality:  Appropriate  Affect:  Flat  Cognitive:  Oriented  Insight:  Improving  Engagement in Group:  Limited  Engagement in Therapy:  Limited  Modes of Intervention:  Activity, Clarification, Education, Problem-solving and Support  Summary of Progress/Problems: Today's group addressed the issue of overcoming obstacles.  Patients were asked to identify their biggest obstacle post d/c that stands in the way of their on-going success, and then problem solve as to how to manage this. Was in group initally.  Stayed about 8 minutes.  Left and did not return.  Daryel Geraldorth, Shaughn Thomley B 03/29/2016   3:17 PM

## 2016-03-29 NOTE — Progress Notes (Signed)
Recreation Therapy Notes  Date: 03/29/16 Time: 1000 Location: 500 Hall Dayroom       Group Topic: Communication, Team Building, Problem Solving  Goal Area(s) Addresses:  Patient will effectively work with peer towards shared goal.  Patient will identify skills used to make activity successful.  Patient will identify how skills used during activity can be used to reach post d/c goals.   Intervention: STEM Activity  Activity: Stage managerLanding Pad. In teams patients were given 12 plastic drinking straws and a length of masking tape. Using the materials provided patients were asked to build a landing pad to catch a golf ball dropped from approximately 6 feet in the air.   Education: Pharmacist, communityocial Skills, Discharge Planning   Education Outcome: Acknowledges education/In group clarification offered/Needs additional education.   Clinical Observations/Feedback: Pt did not attend group.   Caroll RancherMarjette Marilynne Dupuis, LRT/CTRS    Caroll RancherLindsay, Gaylon Melchor A 03/29/2016 11:56 AM

## 2016-03-29 NOTE — Progress Notes (Signed)
  D: Pt continues to pace the hall. Started out in group but was in and out of the room several times. The writer called pt over to assess. When asked about his day pt stated, "helping myself out". Writer attempted to find out how pt was helping himself. However, pt walked away while talking.  Pt has no questions or concerns.    A:  Support and encouragement was offered. 15 min checks continued for safety.  R: Pt remains safe.

## 2016-03-29 NOTE — Progress Notes (Signed)
Adult Psychoeducational Group Note  Date:  03/29/2016 Time:  8:55 PM  Group Topic/Focus:  Wrap-Up Group:   The focus of this group is to help patients review their daily goal of treatment and discuss progress on daily workbooks.   Participation Level:  Active  Participation Quality:  Appropriate  Affect:  Appropriate  Cognitive:  Appropriate  Insight: Appropriate  Engagement in Group:  Engaged  Modes of Intervention:  Discussion  Additional Comments: The patient expressed that he rated today a 10.The patient also said that he attend all groups. Octavio Mannshigpen, Lyle Leisner Lee 03/29/2016, 8:55 PM

## 2016-03-29 NOTE — Progress Notes (Signed)
Adult Psychoeducational Group Note  Date:  03/29/2016 Time:  12:04 AM  Group Topic/Focus:  Wrap-Up Group:   The focus of this group is to help patients review their daily goal of treatment and discuss progress on daily workbooks.   Participation Level:  None  Participation Quality:  did not participate  Affect:  Flat  Cognitive:  Alert  Insight: Lacking  Engagement in Group:  None  Modes of Intervention:  did not engage  Additional Comments:  Pt was attentive, but didn't engage in conversation.  Fransico MichaelBrooks, Shlomo Seres Laverne 03/29/2016, 12:04 AM

## 2016-03-29 NOTE — Progress Notes (Signed)
D: Patient denies SI/HI and A/V hallucinations; patient reports sleep is good; reports appetite is good ; reports energy level is high ; reports ability to concentrate is good; rates depression as 0/10; rates hopelessness 0/10; rates anxiety as 0/10; patient reports that he has to go to work, patient has mentioned it several times;   A: Monitored q 15 minutes; patient encouraged to attend groups; patient educated about medications; patient given medications per physician orders; patient encouraged to express feelings and/or concerns  R: Patient paces up and down the hallway; patient is cooperative; patient is assertive; patient's interaction with staff and peers is minimal; patient was able to set goal to talk with staff 1:1 when having feelings of SI; patient is taking medications as prescribed and tolerating medications; patient is attending all groups

## 2016-03-30 LAB — GLUCOSE, CAPILLARY
GLUCOSE-CAPILLARY: 186 mg/dL — AB (ref 65–99)
Glucose-Capillary: 158 mg/dL — ABNORMAL HIGH (ref 65–99)
Glucose-Capillary: 251 mg/dL — ABNORMAL HIGH (ref 65–99)
Glucose-Capillary: 233 mg/dL — ABNORMAL HIGH (ref 65–99)

## 2016-03-30 LAB — VALPROIC ACID LEVEL: Valproic Acid Lvl: 74 ug/mL (ref 50.0–100.0)

## 2016-03-30 MED ORDER — DOXEPIN HCL 50 MG PO CAPS
50.0000 mg | ORAL_CAPSULE | Freq: Every day | ORAL | Status: DC
  Administered 2016-03-30 – 2016-03-31 (×2): 50 mg via ORAL
  Filled 2016-03-30 (×3): qty 1

## 2016-03-30 NOTE — Plan of Care (Signed)
Problem: Nutritional: Goal: Ability to achieve adequate nutritional intake will improve Outcome: Adequate for Discharge Pt is eating enough at mealtimes.

## 2016-03-30 NOTE — [facility] Group Notes (Signed)
[facility] Group Notes:  (Nursing/MHT/Case Management/Adjunct)  Date:  03/30/2016  Time:  0915  Type of Therapy:  Nurse Education  Participation Level:  Minimal  Participation Quality:  Inattentive  Affect:  Appropriate  Cognitive:  Appropriate  Insight:  Lacking and Limited  Engagement in Group:  Lacking  Modes of Intervention:  Discussion, Education and Support  Summary of Progress/Problems: Pt drifted in-and-out of group.   Maurine SimmeringShugart, Duanne Duchesne M 03/30/2016, 10:04 AM

## 2016-03-30 NOTE — Progress Notes (Addendum)
[facility]  03/30/2016 2:54 PM Osvaldo AngstBarry D Markman  MRN:  [id number] Subjective: Patient reports " I am OK."       Objective: Osvaldo AngstBarry D Meline is a 55 year old AA male with history ofparanoid schizophrenia, DM, HTN,who was found walking naked by neighbors, recommended by his case manager to be brought to the hospital for evaluation.  Patient seen and chart reviewed.Discussed patient with treatment team.  Patient today is seen as calm , denies any new concerns. Pt continues to be a  limited historian , and per staff is seen as redirectable on the unit , however remains delusional often , talking about irrelevant things. Pt per EHR had improved sleep last night . Pt continues to need support . CSW to try to obtain collateral information to clarify his baseline . Support , encouragement provided .      Principal Problem: Paranoid schizophrenia (HCC) Diagnosis:   Patient Active Problem List   Diagnosis Date Noted  . Acute psychosis [F23]   . Leukocytosis [D72.829] 02/06/2015  . Sleep apnea [G47.30] 02/05/2015  . AKI (acute kidney injury) (HCC) [N17.9] 02/04/2015  . Seizure disorder (HCC) [G40.909] 02/04/2015  . Acute encephalopathy [G93.40] 02/04/2015  . Diabetes mellitus without complication (HCC) [E11.9]   . Hyperlipidemia [E78.5]   . Hypertension [I10]   . Paranoid schizophrenia (HCC) [F20.0]    Total Time spent with patient: 25 minutes  Past Psychiatric History: Please see H&P.   Past Medical History:  Past Medical History:  Diagnosis Date  . Diabetes mellitus without complication (HCC)   . GERD (gastroesophageal reflux disease)   . Hyperlipidemia   . Hypertension   . Paranoid schizophrenia (HCC)   . Sleep apnea   . Uric acid nephrolithiasis    History reviewed. No pertinent surgical history. Family History: Patient denies Family Psychiatric  History: Pt denies Social History: Patient [place] , is single , has children who does  not live with him , reports he gets food stamps . History  Alcohol Use No     History  Drug Use No    Social History   Social History  . Marital status: Single    Spouse name: N/A  . Number of children: N/A  . Years of education: N/A   Social History Main Topics  . Smoking status: Current Every Day Smoker    Packs/day: 1.00    Types: Cigarettes  . Smokeless tobacco: Never Used  . Alcohol use No  . Drug use: No  . Sexual activity: Not Currently   Other Topics Concern  . None   Social History Narrative  . None   Additional Social History:                         Sleep: Fair  Appetite:  Fair  Current Medications: Current Facility-Administered Medications  Medication Dose Route Frequency Provider Last Rate Last Dose  . acetaminophen (TYLENOL) tablet 650 mg  650 mg Oral Q6H PRN Kristeen MansFran E Hobson, NP   650 mg at 03/21/16 0329  . amantadine (SYMMETREL) capsule 100 mg  100 mg Oral BID Laveda AbbeLaurie Britton Parks, NP   100 mg at 03/30/16 0800  . divalproex (DEPAKOTE ER) 24 hr tablet 1,250 mg  1,250 mg Oral QHS Jomarie LongsSaramma Kyndell Zeiser, MD   1,250 mg at 03/29/16 2142  . doxepin (SINEQUAN) capsule 50 mg  50 mg Oral QHS Jomarie LongsSaramma Angeleen Horney, MD      .  insulin aspart (novoLOG) injection 0-9 Units  0-9 Units Subcutaneous TID WC Laveda Abbe, NP   2 Units at 03/30/16 1216  . insulin glargine (LANTUS) injection 10 Units  10 Units Subcutaneous QHS Laveda Abbe, NP   10 Units at 03/29/16 2146  . lisinopril (PRINIVIL,ZESTRIL) tablet 10 mg  10 mg Oral Daily Kristeen Mans, NP   10 mg at 03/30/16 0800  . LORazepam (ATIVAN) tablet 1 mg  1 mg Oral QHS Jomarie Longs, MD   1 mg at 03/29/16 2142  . magnesium hydroxide (MILK OF MAGNESIA) suspension 30 mL  30 mL Oral Daily Mena Goes Simon, PA-C   30 mL at 03/30/16 0800  . nicotine polacrilex (NICORETTE) gum 2 mg  2 mg Oral PRN Jomarie Longs, MD      . OLANZapine (ZYPREXA) tablet 5 mg  5 mg Oral TID PRN Jomarie Longs, MD   5 mg at 03/27/16 2114    Or  . OLANZapine (ZYPREXA) injection 5 mg  5 mg Intramuscular TID PRN Jomarie Longs, MD      . paliperidone (INVEGA) 24 hr tablet 12 mg  12 mg Oral QHS Dody Smartt, MD   12 mg at 03/29/16 2142    Lab Results:  Results for orders placed or performed during the hospital encounter of 03/19/16 (from the past 48 hour(s))  Glucose, capillary     Status: Abnormal   Collection Time: 03/28/16  4:46 PM  Result Value Ref Range   Glucose-Capillary 175 (H) 65 - 99 mg/dL  Glucose, capillary     Status: Abnormal   Collection Time: 03/28/16  8:45 PM  Result Value Ref Range   Glucose-Capillary 199 (H) 65 - 99 mg/dL  Glucose, capillary     Status: Abnormal   Collection Time: 03/29/16  6:16 AM  Result Value Ref Range   Glucose-Capillary 159 (H) 65 - 99 mg/dL  Glucose, capillary     Status: Abnormal   Collection Time: 03/29/16 11:41 AM  Result Value Ref Range   Glucose-Capillary 150 (H) 65 - 99 mg/dL  Glucose, capillary     Status: Abnormal   Collection Time: 03/29/16  5:12 PM  Result Value Ref Range   Glucose-Capillary 269 (H) 65 - 99 mg/dL  Glucose, capillary     Status: Abnormal   Collection Time: 03/29/16  8:34 PM  Result Value Ref Range   Glucose-Capillary 237 (H) 65 - 99 mg/dL  Valproic acid level     Status: None   Collection Time: 03/30/16  6:11 AM  Result Value Ref Range   Valproic Acid Lvl 74 50.0 - 100.0 ug/mL    Comment: Performed at [facility]  Glucose, capillary     Status: Abnormal   Collection Time: 03/30/16  6:28 AM  Result Value Ref Range   Glucose-Capillary 186 (H) 65 - 99 mg/dL  Glucose, capillary     Status: Abnormal   Collection Time: 03/30/16 12:00 PM  Result Value Ref Range   Glucose-Capillary 158 (H) 65 - 99 mg/dL    Blood Alcohol level:  Lab Results  Component Value Date   ETH <5 03/18/2016   ETH <5 03/01/2016    Metabolic Disorder Labs: Lab Results  Component Value Date   HGBA1C 8.0 (H) 03/23/2016   MPG 183 03/23/2016   MPG  183 03/20/2016   Lab Results  Component Value Date   PROLACTIN 31.5 (H) 03/23/2016   PROLACTIN 36.6 (H) 03/20/2016   Lab Results  Component Value  Date   CHOL 145 03/23/2016   TRIG 144 03/23/2016   HDL 30 (L) 03/23/2016   CHOLHDL 4.8 03/23/2016   VLDL 29 03/23/2016   LDLCALC 86 03/23/2016   LDLCALC 21 03/20/2016    Physical Findings: AIMS: Facial and Oral Movements Muscles of Facial Expression: None, normal Lips and Perioral Area: None, normal Jaw: None, normal Tongue: None, normal,Extremity Movements Upper (arms, wrists, hands, fingers): None, normal Lower (legs, knees, ankles, toes): None, normal, Trunk Movements Neck, shoulders, hips: None, normal, Overall Severity Severity of abnormal movements (highest score from questions above): None, normal Incapacitation due to abnormal movements: None, normal Patient's awareness of abnormal movements (rate only patient's report): No Awareness, Dental Status Current problems with teeth and/or dentures?: No Does patient usually wear dentures?: No  CIWA:    COWS:     Musculoskeletal: Strength & Muscle Tone: within normal limits Gait & Station: normal Patient leans: N/A  Psychiatric Specialty Exam: Physical Exam  Nursing note and vitals reviewed.   Review of Systems  Psychiatric/Behavioral: Positive for depression. The patient is nervous/anxious.   All other systems reviewed and are negative.   Blood pressure 100/81, pulse (!) 115, temperature 98.6 F (37 C), temperature source Oral, resp. rate 18, height 5' 5.5" (1.664 m), weight 91.6 kg (202 lb), SpO2 100 %.Body mass index is 33.1 kg/m.  General Appearance: Casual and Guarded  Eye Contact:  Fair  Speech:  Blocked and Slow improving  Volume:  Decreased  Mood:  Anxious and Depressed improving  Affect:  Inappropriate smiles to self on and off - continues to do so   Thought Process:  Irrelevant and Descriptions of Associations: Circumstantial improving, on and off   Orientation:  Full (Time, Place, and Person)  Thought Content:  Delusions, Hallucinations: Auditory, Paranoid Ideation, Rumination and Tangential some improvement- denies any AH today - but continues to be delusional and is seen as tangential at times  Suicidal Thoughts:  No but is delusional and disorganized  Homicidal Thoughts:  No  Memory:  Immediate;   Fair Recent;   Fair Remote;   Fair  Judgement:  Impaired  Insight:  Shallow  Psychomotor Activity:  Restlessness  Concentration:  Concentration: Fair and Attention Span: Fair  Recall:  FiservFair  Fund of Knowledge:  Fair  Language:  Fair  Akathisia:  No  Handed:  Right  AIMS (if indicated):     Assets:  Desire for Improvement Resilience  ADL's:  Intact  Cognition:  WNL  Sleep:  Number of Hours: 4.75     Treatment Plan Summary: Osvaldo AngstBarry D Hada is a 55 year old AA male with history ofparanoid schizophrenia, DM, HTN,who was found walking naked by neighbors, recommended by his case manager to be brought to the hospital for evaluation.  Patient today seen as calm , continues to be a limited participant , and has delusions on and off . Will continue treatment.   Will continue today 03/30/16 plan as below except where it is noted.  Daily contact with patient to assess and evaluate symptoms and progress in treatment and Medication management    Will continue Invega  12 mg po qhs for psychosis. Will offer Invega sustenna IM prior to discharge. Pt agrees with plan. Increased  Depakote  ER to 1250 mg po qhs for mood lability , although  Depakote level  - 03/25/16- 72 ( therapeutic) ,  depakote level on 03/30/16- I have reviewed depakote level today - 74- therapeutic. Will increase  Doxepin to 50 mg  po qhs for sleep. Will continue Ativan  1 mg po qhs for anxiety  Will continue to monitor vitals ,medication compliance and treatment side effects while patient is here.  Reviewed labs: EKG - qtc - wnl, TSH- wnl , hba1c- 8- dietician consult  once patient is more stable  ,  pl -31.5 ,  lipid panel HDL - low - diet control recommendations to be provided when patient is more stable. CSW will continue  working on disposition.  Patient to participate in therapeutic milieu Demarcus Thielke, MD 03/30/2016, 2:54 PM

## 2016-03-30 NOTE — Progress Notes (Signed)
D: Ronald Roman has been calm and cooperative this a.m. He's well-dressed and polite. He hasn't attended groups but has up and down the hallway. He still seems preoccupied with getting discharged so he can get to a job, and this a.m he engaged in a conversation about hiring a $1,000-an-hour attorney who is now a Arts administratorpublic defender.   A: Meds given as ordered. No PRNs given/requested. Q15 safety checks maintained. Support/encouragement offered.   R: Pt remains free from harm and continues with treatment. Will continue to monitor for needs/safety.

## 2016-03-30 NOTE — Progress Notes (Signed)
Recreation Therapy Notes  Animal-Assisted Activity (AAA) Program Checklist/Progress Notes Patient Eligibility Criteria Checklist & Daily Group note for Rec Tx Intervention  Date: 11.07.2017 Time: 2:45pm [address]    AAA/T Program Assumption of Risk Form signed by Patient/ or Parent Legal Guardian Yes  Patient is free of allergies or sever asthma Yes  Patient reports no fear of animals Yes  Patient reports no history of cruelty to animals Yes  Patient understands his/her participation is voluntary Yes  Patient washes hands before animal contact Yes  Patient washes hands after animal contact Yes  Behavioral Response: Appropriate   Education: Hand Washing, Appropriate Animal Interaction   Education Outcome: Acknowledges education.   Clinical Observations/Feedback: Patient discussed with MD for appropriateness in pet therapy session. Both LRT and MD agree patient is appropriate for participation. Patient offered participation in session and signed necessary consent form without issue. Patient pet therapy dog appropriately and interacted with peers appropriately during session. Patient asked appropriate questions about therapy dog and shared stories about his pets at home with group.   Marykay Lexenise L Samanthan Dugo, LRT/CTRS         Briceson Broadwater L 03/30/2016 3:07 PM

## 2016-03-30 NOTE — Progress Notes (Signed)
Adult Psychoeducational Group Note  Date:  03/30/2016 Time:  8:33 PM  Group Topic/Focus:  Wrap-Up Group:   The focus of this group is to help patients review their daily goal of treatment and discuss progress on daily workbooks.   Participation Level:  Active  Participation Quality:  Appropriate  Affect:  Appropriate  Cognitive:  Appropriate  Insight: Appropriate  Engagement in Group:  Engaged  Modes of Intervention:  Discussion  Additional Comments:  The patient expressed that he attended all groups.The patient also said that he rates his day a 10. Octavio Mannshigpen, Maude Hettich Lee 03/30/2016, 8:33 PM

## 2016-03-30 NOTE — Progress Notes (Signed)
Recreation Therapy Notes  Date: 03/30/16 Time: 1000 Location: [address] Day[address]  Group Topic: Anger Management  Goal Area(s) Addresses:  Patient will identify triggers for anger.  Patient will identify physical reaction to anger.   Patient will identify benefit of using coping skills when angry.  Intervention: Pencils, worksheet  Activity: Anger Umbrella.  Patients were to identify the feelings that are covered up by their anger and write them inside of the umbrella.  After patients identified the feelings, they were to identify coping skills to deal with those feelings a place them on the outside of umbrella.   Education: Anger Management, Discharge Planning   Education Outcome: Acknowledges education/In group clarification offered/Needs additional education.   Clinical Observations/Feedback:  Pt did not attend group.   Caroll RancherMarjette Mena Lienau, LRT/CTRS          Caroll RancherLindsay, Chawn Spraggins A 03/30/2016 12:38 PM

## 2016-03-30 NOTE — [facility] Group Notes (Signed)
[facility] LCSW Group Therapy  03/30/2016 3:20 PM   Type of Therapy:  Group Therapy  Participation Level:  Active  Participation Quality:  Attentive  Affect:  Appropriate  Cognitive:  Appropriate  Insight:  Improving  Engagement in Therapy:  Engaged  Modes of Intervention:  Clarification, Education, Exploration and Socialization  Summary of Progress/Problems: Today's group focused on relapse prevention.  We defined the term, and then brainstormed on ways to prevent relapse. Per usual, walked in and out multiple times, not staying long, and no meaningful contribution to the discussion.  Daryel Geraldorth, Sherlyne Crownover B 03/30/2016 , 3:20 PM

## 2016-03-30 NOTE — Tx Team (Signed)
Interdisciplinary Treatment and Diagnostic Plan Update  03/30/2016 Time of Session: 3:09 PM  Ronald Roman MRN: [id number]  Principal Diagnosis: Paranoid schizophrenia [facility])  Secondary Diagnoses: Principal Problem:   Paranoid schizophrenia ([place])   Current Medications:  Current Facility-Administered Medications  Medication Dose Route Frequency Provider Last Rate Last Dose  . acetaminophen (TYLENOL) tablet 650 mg  650 mg Oral Q6H PRN Lurena Nida, NP   650 mg at 03/21/16 0329  . amantadine (SYMMETREL) capsule 100 mg  100 mg Oral BID Ethelene Hal, NP   100 mg at 03/30/16 0800  . divalproex (DEPAKOTE ER) 24 hr tablet 1,250 mg  1,250 mg Oral QHS Ursula Alert, MD   1,250 mg at 03/29/16 2142  . doxepin (SINEQUAN) capsule 50 mg  50 mg Oral QHS Saramma Eappen, MD      . insulin aspart (novoLOG) injection 0-9 Units  0-9 Units Subcutaneous TID WC Ethelene Hal, NP   2 Units at 03/30/16 1216  . insulin glargine (LANTUS) injection 10 Units  10 Units Subcutaneous QHS Ethelene Hal, NP   10 Units at 03/29/16 2146  . lisinopril (PRINIVIL,ZESTRIL) tablet 10 mg  10 mg Oral Daily Lurena Nida, NP   10 mg at 03/30/16 0800  . LORazepam (ATIVAN) tablet 1 mg  1 mg Oral QHS Ursula Alert, MD   1 mg at 03/29/16 2142  . magnesium hydroxide (MILK OF MAGNESIA) suspension 30 mL  30 mL Oral Daily Maurine Minister Simon, PA-C   30 mL at 03/30/16 0800  . nicotine polacrilex (NICORETTE) gum 2 mg  2 mg Oral PRN Ursula Alert, MD      . OLANZapine (ZYPREXA) tablet 5 mg  5 mg Oral TID PRN Ursula Alert, MD   5 mg at 03/27/16 2114   Or  . OLANZapine (ZYPREXA) injection 5 mg  5 mg Intramuscular TID PRN Ursula Alert, MD      . paliperidone (INVEGA) 24 hr tablet 12 mg  12 mg Oral QHS Ursula Alert, MD   12 mg at 03/29/16 2142    PTA Medications: Prescriptions Prior to Admission  Medication Sig Dispense Refill Last Dose  . albuterol (PROVENTIL HFA;VENTOLIN HFA) 108 (90 BASE) MCG/ACT inhaler  Inhale 2 puffs into the lungs every 4 (four) hours as needed for wheezing or shortness of breath. (Patient not taking: Reported on 03/19/2016) 1 Inhaler 0 Not Taking at Unknown time  . cetirizine (ZYRTEC) 10 MG chewable tablet Chew 1 tablet (10 mg total) by mouth daily. (Patient not taking: Reported on 03/19/2016) 20 tablet 1 Not Taking at Unknown time  . cloZAPine (CLOZARIL) 100 MG tablet Take 1 tablet (100 mg total) by mouth 2 (two) times daily. (Patient not taking: Reported on 03/19/2016) 60 tablet 0 Not Taking at Unknown time  . divalproex (DEPAKOTE) 250 MG DR tablet Take 1 tablet (250 mg total) by mouth at bedtime. (Patient not taking: Reported on 03/19/2016) 30 tablet 0 Not Taking at Unknown time  . divalproex (DEPAKOTE) 500 MG DR tablet Take 2 tablets (1,000 mg total) by mouth at bedtime. (Patient not taking: Reported on 03/19/2016) 60 tablet 0 Not Taking at Unknown time  . insulin glargine (LANTUS) 100 UNIT/ML injection Inject 10 Units into the skin at bedtime.   Not Taking at Unknown time  . lisinopril (PRINIVIL,ZESTRIL) 10 MG tablet Take 10 mg by mouth daily.    Not Taking at Unknown time  . LORazepam (ATIVAN) 0.5 MG tablet Take 1 tablet (0.5 mg total) by  mouth 2 (two) times daily. (Patient not taking: Reported on 03/19/2016) 60 tablet 0 Not Taking at Unknown time  . metoprolol tartrate (LOPRESSOR) 25 MG tablet Take 25 mg by mouth 2 (two) times daily.   Not Taking at Unknown time  . QUEtiapine (SEROQUEL) 100 MG tablet Take 1 tablet (100 mg total) by mouth at bedtime. (Patient not taking: Reported on 03/19/2016) 30 tablet 0 Not Taking at Unknown time  . QUEtiapine (SEROQUEL) 50 MG tablet Take 1 tablet (50 mg total) by mouth daily. (Patient not taking: Reported on 03/19/2016) 30 tablet 0 Not Taking at Unknown time  . zolpidem (AMBIEN) 5 MG tablet Take 1 tablet (5 mg total) by mouth at bedtime as needed for sleep. (Patient not taking: Reported on 03/19/2016) 30 tablet 0 Not Taking at Unknown time     Treatment Modalities: Medication Management, Group therapy, Case management,  1 to 1 session with clinician, Psychoeducation, Recreational therapy.   Physician Treatment Plan for Primary Diagnosis: Paranoid schizophrenia ([place]) Long Term Goal(s): Improvement in symptoms so as ready for discharge  Short Term Goals: Ability to identify changes in lifestyle to reduce recurrence of condition will improve  Medication Management: Evaluate patient's response, side effects, and tolerance of medication regimen.  Therapeutic Interventions: 1 to 1 sessions, Unit Group sessions and Medication administration.  Evaluation of Outcomes: Not Met  Physician Treatment Plan for Secondary Diagnosis: Principal Problem:   Paranoid schizophrenia ([place])   Long Term Goal(s): Improvement in symptoms so as ready for discharge  Short Term Goals: Ability to identify and develop effective coping behaviors will improve  Medication Management: Evaluate patient's response, side effects, and tolerance of medication regimen.  Therapeutic Interventions: 1 to 1 sessions, Unit Group sessions and Medication administration.  Evaluation of Outcomes: Not Met  11/2: Patient today seen as disorganized, delusional - will change his clozaril to [place] - given his inability to be compliant with labs as well as taking his medications , and given the fact that he is going to return to his independent living situation with case management available . Will discontinue  Clozaril for inability to be compliant with labs. Will start a trial of Invega 3 mg po qhs for psychosis. Will offer Invega sustenna IM prior to discharge. Pt agrees with plan. Will continue Depakote , change to ER 1000 mg po qhs for mood sx. Depakote level  - 03/25/16. Will discontinue Trazodone for lack of efficacy. Start Doxepin 10 mg po qhs for sleep.  11/7:  Will continue Invega  12 mg po qhs for psychosis. Will offer Invega sustenna IM prior to discharge. Pt  agrees with plan. Increased  Depakote  ER to 1250 mg po qhs for mood lability , although Depakote level - 03/25/16- 72 ( therapeutic) ,  depakote level on 03/30/16- I have reviewed depakote level today - 74- therapeutic. Will increase  Doxepin to 50 mg po qhs for sleep. Will continue Ativan  1 mg po qhs for anxiety    RN Treatment Plan for Primary Diagnosis: Paranoid schizophrenia ([place]) Long Term Goal(s): Knowledge of disease and therapeutic regimen to maintain health will improve  Short Term Goals: Compliance with prescribed medications will improve  Medication Management: RN will administer medications as ordered by provider, will assess and evaluate patient's response and provide education to patient for prescribed medication. RN will report any adverse and/or side effects to prescribing provider.  Therapeutic Interventions: 1 on 1 counseling sessions, Psychoeducation, Medication administration, Evaluate responses to treatment, Monitor vital signs and  CBGs as ordered, Perform/monitor CIWA, COWS, AIMS and Fall Risk screenings as ordered, Perform wound care treatments as ordered.  Evaluation of Outcomes: Progressing   LCSW Treatment Plan for Primary Diagnosis: Paranoid schizophrenia ([place]) Long Term Goal(s): Safe transition to appropriate next level of care at discharge, Engage patient in therapeutic group addressing interpersonal concerns.  Short Term Goals: Engage patient in aftercare planning with referrals and resources  Therapeutic Interventions: Assess for all discharge needs, 1 to 1 time with Social worker, Explore available resources and support systems, Assess for adequacy in community support network, Educate family and significant other(s) on suicide prevention, Complete Psychosocial Assessment, Interpersonal group therapy.  Evaluation of Outcomes: Progressing  CSW left message for Melody Jefferey Pica transition coordinator at [phone]. 11/2:  [facility] does not  support referral to [facility].  However, they are requesting referral to [facility] as a woman with whom Kastin had a good relationship with in the past has transferred to that agency.  As noted above, we are trying to switch him to injectable due to recent history of non-compliance with meds, and the fact that he will not be monitored daily. 11/7:  [facility] came to see patient.  Unable to complete assessment due to delusions, disorganization   Progress in Treatment: Attending groups: No Participating in groups: No Taking medication as prescribed: Yes Toleration medication: Yes, no side effects reported at this time Family/Significant other contact made: Yes  See above Patient understands diagnosis: No  Limited insight Discussing patient identified problems/goals with staff: Yes Medical problems stabilized or resolved: Yes Denies suicidal/homicidal ideation: Yes Issues/concerns per patient self-inventory: None Other: N/A  New problem(s) identified: None identified at this time.   New Short Term/Long Term Goal(s): None identified at this time.   Discharge Plan or Barriers: Likley return home, follow up outpt  Reason for Continuation of Hospitalization: Disorganization Medication stabilization Delusions   Estimated Length of Stay: 3-5 days  Attendees: Patient: 03/30/2016  3:09 PM  Physician: Ursula Alert, MD 03/30/2016  3:09 PM  Nursing: Eulogio Bear RN 03/30/2016  3:09 PM  RN Care Manager: Lars Pinks, RN 03/30/2016  3:09 PM  Social Worker: Ripley Fraise 03/30/2016  3:09 PM  Recreational Therapist: Marjette  03/30/2016  3:09 PM  Other: Norberto Sorenson 03/30/2016  3:09 PM  Other:  03/30/2016  3:09 PM    Scribe for Treatment Team:  Roque Lias LCSW 03/30/2016 3:09 PM

## 2016-03-31 LAB — GLUCOSE, CAPILLARY
GLUCOSE-CAPILLARY: 172 mg/dL — AB (ref 65–99)
Glucose-Capillary: 194 mg/dL — ABNORMAL HIGH (ref 65–99)
Glucose-Capillary: 239 mg/dL — ABNORMAL HIGH (ref 65–99)
Glucose-Capillary: 257 mg/dL — ABNORMAL HIGH (ref 65–99)

## 2016-03-31 MED ORDER — ZIPRASIDONE HCL 40 MG PO CAPS
40.0000 mg | ORAL_CAPSULE | Freq: Two times a day (BID) | ORAL | Status: DC
  Administered 2016-03-31 – 2016-04-02 (×4): 40 mg via ORAL
  Filled 2016-03-31 (×6): qty 1

## 2016-03-31 NOTE — Plan of Care (Signed)
Problem: Activity: Goal: Sleeping patterns will improve Outcome: Progressing Pt slept 5.25 hours, per chart, which is an improvement. He reports sleeping well.

## 2016-03-31 NOTE — [facility] Group Notes (Signed)
[facility] LCSW Group Therapy  03/31/2016 2:44 PM   Type of Therapy:  Group Therapy   Participation Level:  Engaged  Participation Quality:  Attentive  Affect:  Appropriate   Cognitive:  Alert   Insight:  Engaged  Engagement in Therapy:  Improving   Modes of Intervention:  Education, Exploration, Socialization   Summary of Progress/Problems:Inivted, chose not to attend.   [name] from [facility] [employer] was here to tell his story of recovery, inform patients about [facility] and play his guitar.   Ronald DaubJolan Dayvion Roman 03/31/2016 2:44 PM

## 2016-03-31 NOTE — Progress Notes (Signed)
Recreation Therapy Notes  Date: 03/31/16 Time: 1000 Location: 500 Hall Dayroom  Group Topic: Leisure Education  Goal Area(s) Addresses:  Patient will identify positive leisure activities.  Patient will identify one positive benefit of participation in leisure activities.   Intervention: Dry erase board, dry erase marker, eraser, various activities in Roman can  Activity: Leisure Pictionary.  LRT introduced to concept of leisure.  Patients were to pick Roman strip of paper from the can with an activity on it.  Patients were to then draw the Roman picture of the activity on the board.  The remainder of the group had to try Roman guess what was being drawn.  The person that guesses correctly, would get the next turn.  Education:  Leisure Education, Discharge Planning  Education Outcome: Acknowledges education/In group clarification offered/Needs additional education  Clinical Observations/Feedback: Pt did not attend group.   Quy Lotts, LRT/CTRS         Ronald Roman 03/31/2016 12:39 PM 

## 2016-03-31 NOTE — Progress Notes (Signed)
D: Ronald Roman is calm and cooperative. He remains focused on going home, telling staff that the vice president and the president have said he could leave. He has some delusions but is always pleasant. He reports having adequate sleep and denies needs.  A: Meds given as ordered. Q15 safety checks maintained. Support/encouragement offered.  R: Pt remains free from harm and continues with treatment. Will continue to monitor for needs/safety.

## 2016-03-31 NOTE — Progress Notes (Signed)
   D: When asked about his day pt stated, "be over at mental health as soon as I can. He say they'll stay until I get there as soon as I get a cig".  When asked if he had any questions or concerns pt stated, "the Dr told me not to brush my teeth until see's me".  Pt has no other questions or concerns.    A:  Support and encouragement was offered. 15 min checks continued for safety.  R: Pt remains safe.

## 2016-03-31 NOTE — Progress Notes (Signed)
Inpatient Diabetes Program Recommendations  AACE/ADA: New Consensus Statement on Inpatient Glycemic Control (2015)  Target Ranges:  Prepandial:   less than 140 mg/dL      Peak postprandial:   less than 180 mg/dL (1-2 hours)      Critically ill patients:  140 - 180 mg/dL   Results for Osvaldo AngstOWNSEND, Korde D (MRN [id number]) as of 03/31/2016 08:43  Ref. Range 03/30/2016 06:28 03/30/2016 12:00 03/30/2016 17:09 03/30/2016 20:21  Glucose-Capillary Latest Ref Range: 65 - 99 mg/dL 147186 (H) 829158 (H) 562251 (H) 233 (H)   Results for Osvaldo AngstOWNSEND, Devlon D (MRN [id number]) as of 03/31/2016 08:43  Ref. Range 03/31/2016 06:21  Glucose-Capillary Latest Ref Range: 65 - 99 mg/dL 696194 (H)    Admit with: Paranoid Schizophrenia  History: DM2  Home DM Meds: Lantus 10 units QHS  Current Insulin Orders: Lantus 10 units QHS      Novolog Sensitive Correction Scale/ SSI (0-9 units) TID AC       MD- Please consider the following in-hospital insulin adjustments:  1. Increase Lantus slightly to 12 units QHS  2. Increase Novolog Correction Scale/ SSI to Moderate scale (0-15 units) TID AC + HS (currently ordered as Sensitive scale)       --Will follow patient during hospitalization--  Ambrose FinlandJeannine Johnston Shloma Roggenkamp RN, MSN, CDE Diabetes Coordinator Inpatient Glycemic Control Team Team Pager: [phone] (8a-5p)

## 2016-03-31 NOTE — Progress Notes (Signed)
[facility]  03/31/2016 3:09 PM Ronald Roman  MRN:  [id number] Subjective: Patient reports " I want to return home to my children, I cook for them , I also cook for latoya .'        Objective: Ronald AngstBarry D Roman is a 55 year old AA male with history ofparanoid schizophrenia, DM, HTN,who was found walking naked by neighbors, recommended by his case manager to be brought to the hospital for evaluation.  Patient seen and chart reviewed.Discussed patient with treatment team.  Patient today denies new concerns . Pt continues to be a  limited historian , continues to be focussed on chronic delusions , however is more redirectable on the unit. Pt 's sleep has improved and he is compliant on his medications. Support , encouragement provided .  CSW was able to obtain collateral information from his community support - who reported patient's baseline a few months ago was much better , he does have chronic delusions , but he was abel to take care of self and participate better in decision making discussions .      Principal Problem: Paranoid schizophrenia (HCC) Diagnosis:   Patient Active Problem List   Diagnosis Date Noted  . Acute psychosis [F23]   . Leukocytosis [D72.829] 02/06/2015  . Sleep apnea [G47.30] 02/05/2015  . AKI (acute kidney injury) (HCC) [N17.9] 02/04/2015  . Seizure disorder (HCC) [G40.909] 02/04/2015  . Acute encephalopathy [G93.40] 02/04/2015  . Diabetes mellitus without complication (HCC) [E11.9]   . Hyperlipidemia [E78.5]   . Hypertension [I10]   . Paranoid schizophrenia (HCC) [F20.0]    Total Time spent with patient: 25 minutes  Past Psychiatric History: Please see H&P.   Past Medical History:  Past Medical History:  Diagnosis Date  . Diabetes mellitus without complication (HCC)   . GERD (gastroesophageal reflux disease)   . Hyperlipidemia   . Hypertension   . Paranoid schizophrenia (HCC)   . Sleep apnea   . Uric acid nephrolithiasis     History reviewed. No pertinent surgical history. Family History: Patient denies Family Psychiatric  History: Pt denies Social History: Patient [place] , is single , has children who does not live with him , reports he gets food stamps . History  Alcohol Use No     History  Drug Use No    Social History   Social History  . Marital status: Single    Spouse name: N/A  . Number of children: N/A  . Years of education: N/A   Social History Main Topics  . Smoking status: Current Every Day Smoker    Packs/day: 1.00    Types: Cigarettes  . Smokeless tobacco: Never Used  . Alcohol use No  . Drug use: No  . Sexual activity: Not Currently   Other Topics Concern  . None   Social History Narrative  . None   Additional Social History:                         Sleep: Fair  Appetite:  Fair  Current Medications: Current Facility-Administered Medications  Medication Dose Route Frequency Provider Last Rate Last Dose  . acetaminophen (TYLENOL) tablet 650 mg  650 mg Oral Q6H PRN Kristeen MansFran E Hobson, NP   650 mg at 03/21/16 0329  . amantadine (SYMMETREL) capsule 100 mg  100 mg Oral BID Laveda AbbeLaurie Britton Parks, NP   100 mg at 03/31/16 0750  . divalproex (DEPAKOTE ER)  24 hr tablet 1,250 mg  1,250 mg Oral QHS Jomarie LongsSaramma Malachi Suderman, MD   1,250 mg at 03/30/16 2139  . doxepin (SINEQUAN) capsule 50 mg  50 mg Oral QHS Jomarie LongsSaramma Docie Abramovich, MD   50 mg at 03/30/16 2139  . insulin aspart (novoLOG) injection 0-9 Units  0-9 Units Subcutaneous TID WC Laveda AbbeLaurie Britton Parks, NP   2 Units at 03/31/16 1209  . insulin glargine (LANTUS) injection 10 Units  10 Units Subcutaneous QHS Laveda AbbeLaurie Britton Parks, NP   10 Units at 03/30/16 2138  . lisinopril (PRINIVIL,ZESTRIL) tablet 10 mg  10 mg Oral Daily Kristeen MansFran E Hobson, NP   10 mg at 03/31/16 0750  . LORazepam (ATIVAN) tablet 1 mg  1 mg Oral QHS Jomarie LongsSaramma Velta Rockholt, MD   1 mg at 03/30/16 2139  . magnesium hydroxide (MILK OF MAGNESIA) suspension 30 mL  30 mL Oral  Daily Kerry HoughSpencer E Simon, PA-C   30 mL at 03/31/16 0750  . nicotine polacrilex (NICORETTE) gum 2 mg  2 mg Oral PRN Jomarie LongsSaramma Mesa Janus, MD      . OLANZapine (ZYPREXA) tablet 5 mg  5 mg Oral TID PRN Jomarie LongsSaramma [place], MD   5 mg at 03/27/16 2114   Or  . OLANZapine (ZYPREXA) injection 5 mg  5 mg Intramuscular TID PRN Jomarie LongsSaramma Lashunda Greis, MD      . paliperidone (INVEGA) 24 hr tablet 12 mg  12 mg Oral QHS Jomarie LongsSaramma Kaliann Coryell, MD   12 mg at 03/30/16 2139  . ziprasidone (GEODON) capsule 40 mg  40 mg Oral BID WC Jomarie LongsSaramma Waylynn Benefiel, MD        Lab Results:  Results for orders placed or performed during the hospital encounter of 03/19/16 (from the past 48 hour(s))  Glucose, capillary     Status: Abnormal   Collection Time: 03/29/16  5:12 PM  Result Value Ref Range   Glucose-Capillary 269 (H) 65 - 99 mg/dL  Glucose, capillary     Status: Abnormal   Collection Time: 03/29/16  8:34 PM  Result Value Ref Range   Glucose-Capillary 237 (H) 65 - 99 mg/dL  Valproic acid level     Status: None   Collection Time: 03/30/16  6:11 AM  Result Value Ref Range   Valproic Acid Lvl 74 50.0 - 100.0 ug/mL    Comment[facility]  Glucose, capillary     Status: Abnormal   Collection Time: 03/30/16  6:28 AM  Result Value Ref Range   Glucose-Capillary 186 (H) 65 - 99 mg/dL  Glucose, capillary     Status: Abnormal   Collection Time: 03/30/16 12:00 PM  Result Value Ref Range   Glucose-Capillary 158 (H) 65 - 99 mg/dL  Glucose, capillary     Status: Abnormal   Collection Time: 03/30/16  5:09 PM  Result Value Ref Range   Glucose-Capillary 251 (H) 65 - 99 mg/dL   Comment 1 Notify RN    Comment 2 Document in Chart   Glucose, capillary     Status: Abnormal   Collection Time: 03/30/16  8:21 PM  Result Value Ref Range   Glucose-Capillary 233 (H) 65 - 99 mg/dL  Glucose, capillary     Status: Abnormal   Collection Time: 03/31/16  6:21 AM  Result Value Ref Range   Glucose-Capillary 194 (H) 65 - 99 mg/dL   Glucose, capillary     Status: Abnormal   Collection Time: 03/31/16 12:06 PM  Result Value Ref Range   Glucose-Capillary 172 (H) 65 - 99  mg/dL    Blood Alcohol level:  Lab Results  Component Value Date   [facility] <5 03/18/2016   ETH <5 03/01/2016    Metabolic Disorder Labs: Lab Results  Component Value Date   HGBA1C 8.0 (H) 03/23/2016   MPG 183 03/23/2016   MPG 183 03/20/2016   Lab Results  Component Value Date   PROLACTIN 31.5 (H) 03/23/2016   PROLACTIN 36.6 (H) 03/20/2016   Lab Results  Component Value Date   CHOL 145 03/23/2016   TRIG 144 03/23/2016   HDL 30 (L) 03/23/2016   CHOLHDL 4.8 03/23/2016   VLDL 29 03/23/2016   LDLCALC 86 03/23/2016   LDLCALC 21 03/20/2016    Physical Findings: AIMS: Facial and Oral Movements Muscles of Facial Expression: None, normal Lips and Perioral Area: None, normal Jaw: None, normal Tongue: None, normal,Extremity Movements Upper (arms, wrists, hands, fingers): None, normal Lower (legs, knees, ankles, toes): None, normal, Trunk Movements Neck, shoulders, hips: None, normal, Overall Severity Severity of abnormal movements (highest score from questions above): None, normal Incapacitation due to abnormal movements: None, normal Patient's awareness of abnormal movements (rate only patient's report): No Awareness, Dental Status Current problems with teeth and/or dentures?: No Does patient usually wear dentures?: No  CIWA:    COWS:     Musculoskeletal: Strength & Muscle Tone: within normal limits Gait & Station: normal Patient leans: N/A  Psychiatric Specialty Exam: Physical Exam  Nursing note and vitals reviewed.   Review of Systems  Psychiatric/Behavioral: Positive for depression. The patient is nervous/anxious.   All other systems reviewed and are negative.   Blood pressure 95/72, pulse (!) 122, temperature 98.9 F (37.2 C), temperature source Oral, resp. rate 18, height 5' 5.5" (1.664 m), weight 91.6 kg (202 lb), SpO2 100  %.Body mass index is 33.1 kg/m.  General Appearance: Casual and Guarded  Eye Contact:  Fair  Speech:  Normal Rate improving  Volume:  Decreased  Mood:  Anxious and Depressed improving  Affect:  Inappropriate smiles to self on and off - continues to do so   Thought Process:  Irrelevant and Descriptions of Associations: Circumstantial on and off   Orientation:  Full (Time, Place, and Person)  Thought Content:  Delusions, Hallucinations: Auditory, Paranoid Ideation, Rumination and Tangential continues to be delusional and is seen as tangential at times  Suicidal Thoughts:  No but is delusional and disorganized on and off   Homicidal Thoughts:  No  Memory:  Immediate;   Fair Recent;   Fair Remote;   Fair  Judgement:  Impaired  Insight:  Shallow  Psychomotor Activity:  Restlessness  Concentration:  Concentration: Fair and Attention Span: Fair  Recall:  [employer] of Knowledge:  Fair  Language:  Fair  Akathisia:  No  Handed:  Right  AIMS (if indicated):     Assets:  Desire for Improvement Resilience  ADL's:  Intact  Cognition:  WNL  Sleep:  Number of Hours: 5.25     Treatment Plan Summary: KAELUM KISSICK is a 55 year old AA male with history ofparanoid schizophrenia, DM, HTN,who was found walking naked by neighbors, recommended by his case manager to be brought to the hospital for evaluation.  Patient today seen as calm ,however continues to be delusional , making irrelevant statements - will add a second antipsychotic geodon to augment the effect of Invega . Will continue treatment.   Will continue today 11/8 /17 plan as below except where it is noted.  Daily contact with patient  to assess and evaluate symptoms and progress in treatment and Medication management    Will continue Invega  12 mg po qhs for psychosis. Will add Geodon 40 mg po bid with meals for augmenting the effect of Invega , will reduce the dose of Invega slowly if needed. Increased  Depakote  ER to 1250  mg po qhs for mood lability , although  Depakote level  - 03/25/16- 72 ( therapeutic) ,  depakote level on 03/30/16- I have reviewed depakote level today - 74- therapeutic. Will continue  Doxepin  50 mg po qhs for sleep. Will continue Ativan  1 mg po qhs for anxiety  Will continue to monitor vitals ,medication compliance and treatment side effects while patient is here.  Reviewed labs: EKG - qtc - wnl, TSH- wnl , hba1c- 8- dietician consult once patient is more stable  ,  pl -31.5 ,  lipid panel HDL - low - diet control recommendations to be provided when patient is more stable. CSW will continue  working on disposition. Patient likely will need [facility]. Patient to participate in therapeutic milieu Vong Garringer, MD 03/31/2016, 3:09 PM

## 2016-04-01 LAB — GLUCOSE, CAPILLARY
GLUCOSE-CAPILLARY: 223 mg/dL — AB (ref 65–99)
GLUCOSE-CAPILLARY: 201 mg/dL — AB (ref 65–99)
GLUCOSE-CAPILLARY: 325 mg/dL — AB (ref 65–99)
Glucose-Capillary: 369 mg/dL — ABNORMAL HIGH (ref 65–99)

## 2016-04-01 MED ORDER — INSULIN GLARGINE 100 UNIT/ML ~~LOC~~ SOLN
15.0000 [IU] | Freq: Every day | SUBCUTANEOUS | Status: DC
  Administered 2016-04-01: 15 [IU] via SUBCUTANEOUS

## 2016-04-01 MED ORDER — INSULIN ASPART 100 UNIT/ML ~~LOC~~ SOLN
0.0000 [IU] | Freq: Three times a day (TID) | SUBCUTANEOUS | Status: DC
  Administered 2016-04-01: 11 [IU] via SUBCUTANEOUS
  Administered 2016-04-02: 5 [IU] via SUBCUTANEOUS
  Administered 2016-04-02: 4 [IU] via SUBCUTANEOUS
  Administered 2016-04-02: 8 [IU] via SUBCUTANEOUS
  Administered 2016-04-03: 5 [IU] via SUBCUTANEOUS
  Administered 2016-04-03 (×2): 3 [IU] via SUBCUTANEOUS
  Administered 2016-04-04: 0 [IU] via SUBCUTANEOUS
  Administered 2016-04-04: 5 [IU] via SUBCUTANEOUS
  Administered 2016-04-04: 3 [IU] via SUBCUTANEOUS
  Administered 2016-04-05: 0 [IU] via SUBCUTANEOUS
  Administered 2016-04-05 – 2016-04-06 (×2): 2 [IU] via SUBCUTANEOUS
  Administered 2016-04-06: 7 [IU] via SUBCUTANEOUS
  Administered 2016-04-07: 3 [IU] via SUBCUTANEOUS
  Administered 2016-04-08: 2 [IU] via SUBCUTANEOUS
  Administered 2016-04-09: 3 [IU] via SUBCUTANEOUS
  Administered 2016-04-09 – 2016-04-11 (×3): 2 [IU] via SUBCUTANEOUS
  Administered 2016-04-11: 3 [IU] via SUBCUTANEOUS
  Administered 2016-04-12 (×2): 2 [IU] via SUBCUTANEOUS
  Administered 2016-04-12: 0 [IU] via SUBCUTANEOUS
  Administered 2016-04-13: 2 [IU] via SUBCUTANEOUS
  Administered 2016-04-13: 0 [IU] via SUBCUTANEOUS
  Administered 2016-04-13 – 2016-04-14 (×3): 2 [IU] via SUBCUTANEOUS
  Administered 2016-04-15 – 2016-04-16 (×3): 3 [IU] via SUBCUTANEOUS
  Administered 2016-04-17 (×2): 2 [IU] via SUBCUTANEOUS
  Administered 2016-04-18: 3 [IU] via SUBCUTANEOUS
  Administered 2016-04-18 – 2016-04-19 (×3): 2 [IU] via SUBCUTANEOUS
  Administered 2016-04-19: 3 [IU] via SUBCUTANEOUS
  Administered 2016-04-20 (×2): 2 [IU] via SUBCUTANEOUS
  Administered 2016-04-20: 3 [IU] via SUBCUTANEOUS
  Administered 2016-04-21: 2 [IU] via SUBCUTANEOUS
  Administered 2016-04-21: 3 [IU] via SUBCUTANEOUS
  Administered 2016-04-21: 5 [IU] via SUBCUTANEOUS
  Administered 2016-04-22: 11 [IU] via SUBCUTANEOUS
  Administered 2016-04-22: 5 [IU] via SUBCUTANEOUS
  Administered 2016-04-22: 3 [IU] via SUBCUTANEOUS
  Administered 2016-04-23 (×2): 5 [IU] via SUBCUTANEOUS
  Administered 2016-04-23: 15 [IU] via SUBCUTANEOUS
  Administered 2016-04-24: 3 [IU] via SUBCUTANEOUS
  Administered 2016-04-24: 5 [IU] via SUBCUTANEOUS
  Administered 2016-04-24: 11 [IU] via SUBCUTANEOUS
  Administered 2016-04-25 (×3): 5 [IU] via SUBCUTANEOUS
  Administered 2016-04-26 (×2): 3 [IU] via SUBCUTANEOUS
  Administered 2016-04-26: 11 [IU] via SUBCUTANEOUS
  Administered 2016-04-27: 5 [IU] via SUBCUTANEOUS
  Administered 2016-04-27: 11 [IU] via SUBCUTANEOUS

## 2016-04-01 MED ORDER — AMITRIPTYLINE HCL 25 MG PO TABS
25.0000 mg | ORAL_TABLET | Freq: Every day | ORAL | Status: DC
  Administered 2016-04-01 – 2016-04-04 (×4): 25 mg via ORAL
  Filled 2016-04-01 (×5): qty 1

## 2016-04-01 MED ORDER — PALIPERIDONE ER 3 MG PO TB24
9.0000 mg | ORAL_TABLET | Freq: Every day | ORAL | Status: DC
  Administered 2016-04-01 – 2016-04-02 (×2): 9 mg via ORAL
  Filled 2016-04-01 (×4): qty 3

## 2016-04-01 MED ORDER — INSULIN ASPART 100 UNIT/ML ~~LOC~~ SOLN
0.0000 [IU] | Freq: Every day | SUBCUTANEOUS | Status: DC
  Administered 2016-04-01: 5 [IU] via SUBCUTANEOUS
  Administered 2016-04-15 – 2016-04-23 (×4): 2 [IU] via SUBCUTANEOUS
  Administered 2016-04-24: 5 [IU] via SUBCUTANEOUS
  Administered 2016-04-25 – 2016-04-26 (×2): 2 [IU] via SUBCUTANEOUS

## 2016-04-01 NOTE — Progress Notes (Signed)
[facility]  04/01/2016 1:35 PM Ronald Roman  MRN:  [id number] Subjective: Patient reports " I am ok. I have a place to go . [place] ."    Objective: Ronald Roman is a 55 year old AA male with history ofparanoid schizophrenia, DM, HTN,who was found walking naked by neighbors, recommended by his case manager to be brought to the hospital for evaluation.  Patient seen and chart reviewed.Discussed patient with treatment team.  Patient today continues to presents as delusional , also had a restless night last night. His slept only a few hrs. Pt however unable to verbalize his sx , minimizes it . Pt was started on a second antipsychotic yesterday - to augment the Invega , since he continued to be delusional although Hinda Glatternvega was maximized. Will continue to support and treat.      Principal Problem: Paranoid schizophrenia (HCC) Diagnosis:   Patient Active Problem List   Diagnosis Date Noted  . Acute psychosis [F23]   . Leukocytosis [D72.829] 02/06/2015  . Sleep apnea [G47.30] 02/05/2015  . AKI (acute kidney injury) (HCC) [N17.9] 02/04/2015  . Seizure disorder (HCC) [G40.909] 02/04/2015  . Acute encephalopathy [G93.40] 02/04/2015  . Diabetes mellitus without complication (HCC) [E11.9]   . Hyperlipidemia [E78.5]   . Hypertension [I10]   . Paranoid schizophrenia (HCC) [F20.0]    Total Time spent with patient: 25 minutes  Past Psychiatric History: Please see H&P.   Past Medical History:  Past Medical History:  Diagnosis Date  . Diabetes mellitus without complication (HCC)   . GERD (gastroesophageal reflux disease)   . Hyperlipidemia   . Hypertension   . Paranoid schizophrenia (HCC)   . Sleep apnea   . Uric acid nephrolithiasis    History reviewed. No pertinent surgical history. Family History: Patient denies Family Psychiatric  History: Pt denies Social History: Patient [place] , is single , has children who  does not live with him , reports he gets food stamps . History  Alcohol Use No     History  Drug Use No    Social History   Social History  . Marital status: Single    Spouse name: N/A  . Number of children: N/A  . Years of education: N/A   Social History Main Topics  . Smoking status: Current Every Day Smoker    Packs/day: 1.00    Types: Cigarettes  . Smokeless tobacco: Never Used  . Alcohol use No  . Drug use: No  . Sexual activity: Not Currently   Other Topics Concern  . None   Social History Narrative  . None   Additional Social History:                         Sleep: pt reports OK , but documented as poor  Appetite:  Fair  Current Medications: Current Facility-Administered Medications  Medication Dose Route Frequency Provider Last Rate Last Dose  . acetaminophen (TYLENOL) tablet 650 mg  650 mg Oral Q6H PRN Kristeen MansFran E Hobson, NP   650 mg at 03/21/16 0329  . amantadine (SYMMETREL) capsule 100 mg  100 mg Oral BID Laveda AbbeLaurie Britton Parks, NP   100 mg at 04/01/16 0800  . amitriptyline (ELAVIL) tablet 25 mg  25 mg Oral QHS Teagon Kron, MD      . divalproex (DEPAKOTE ER) 24 hr tablet 1,250 mg  1,250 mg Oral QHS Laresha Bacorn  Swati Granberry, MD   1,250 mg at 03/31/16 2117  . insulin aspart (novoLOG) injection 0-15 Units  0-15 Units Subcutaneous TID WC Vandana Haman, MD      . insulin aspart (novoLOG) injection 0-5 Units  0-5 Units Subcutaneous QHS Jaylanni Eltringham, MD      . insulin glargine (LANTUS) injection 15 Units  15 Units Subcutaneous QHS Sharonne Ricketts, MD      . lisinopril (PRINIVIL,ZESTRIL) tablet 10 mg  10 mg Oral Daily Kristeen Mans, NP   10 mg at 04/01/16 0800  . LORazepam (ATIVAN) tablet 1 mg  1 mg Oral QHS Jomarie Longs, MD   1 mg at 03/31/16 2117  . magnesium hydroxide (MILK OF MAGNESIA) suspension 30 mL  30 mL Oral Daily Mena Goes Simon, PA-C   30 mL at 04/01/16 0800  . nicotine polacrilex (NICORETTE) gum 2 mg  2 mg Oral PRN Jomarie Longs, MD      .  OLANZapine (ZYPREXA) tablet 5 mg  5 mg Oral TID PRN Jomarie Longs, MD   5 mg at 04/01/16 0219   Or  . OLANZapine (ZYPREXA) injection 5 mg  5 mg Intramuscular TID PRN Jomarie Longs, MD      . paliperidone (INVEGA) 24 hr tablet 9 mg  9 mg Oral QHS Kylia Grajales, MD      . ziprasidone (GEODON) capsule 40 mg  40 mg Oral BID WC Evea Sheek, MD   40 mg at 04/01/16 0800    Lab Results:  Results for orders placed or performed during the hospital encounter of 03/19/16 (from the past 48 hour(s))  Glucose, capillary     Status: Abnormal   Collection Time: 03/30/16  5:09 PM  Result Value Ref Range   Glucose-Capillary 251 (H) 65 - 99 mg/dL   Comment 1 Notify RN    Comment 2 Document in Chart   Glucose, capillary     Status: Abnormal   Collection Time: 03/30/16  8:21 PM  Result Value Ref Range   Glucose-Capillary 233 (H) 65 - 99 mg/dL  Glucose, capillary     Status: Abnormal   Collection Time: 03/31/16  6:21 AM  Result Value Ref Range   Glucose-Capillary 194 (H) 65 - 99 mg/dL  Glucose, capillary     Status: Abnormal   Collection Time: 03/31/16 12:06 PM  Result Value Ref Range   Glucose-Capillary 172 (H) 65 - 99 mg/dL  Glucose, capillary     Status: Abnormal   Collection Time: 03/31/16  5:12 PM  Result Value Ref Range   Glucose-Capillary 239 (H) 65 - 99 mg/dL  Glucose, capillary     Status: Abnormal   Collection Time: 03/31/16  8:24 PM  Result Value Ref Range   Glucose-Capillary 257 (H) 65 - 99 mg/dL  Glucose, capillary     Status: Abnormal   Collection Time: 04/01/16  6:06 AM  Result Value Ref Range   Glucose-Capillary 223 (H) 65 - 99 mg/dL  Glucose, capillary     Status: Abnormal   Collection Time: 04/01/16 11:28 AM  Result Value Ref Range   Glucose-Capillary 201 (H) 65 - 99 mg/dL   Comment 1 Notify RN    Comment 2 Document in Chart     Blood Alcohol level:  Lab Results  Component Value Date   ETH <5 03/18/2016   ETH <5 03/01/2016    Metabolic Disorder Labs: Lab  Results  Component Value Date   HGBA1C 8.0 (H) 03/23/2016   MPG 183 03/23/2016  MPG 183 03/20/2016   Lab Results  Component Value Date   PROLACTIN 31.5 (H) 03/23/2016   PROLACTIN 36.6 (H) 03/20/2016   Lab Results  Component Value Date   CHOL 145 03/23/2016   TRIG 144 03/23/2016   HDL 30 (L) 03/23/2016   CHOLHDL 4.8 03/23/2016   VLDL 29 03/23/2016   LDLCALC 86 03/23/2016   LDLCALC 21 03/20/2016    Physical Findings: AIMS: Facial and Oral Movements Muscles of Facial Expression: None, normal Lips and Perioral Area: None, normal Jaw: None, normal Tongue: None, normal,Extremity Movements Upper (arms, wrists, hands, fingers): None, normal Lower (legs, knees, ankles, toes): None, normal, Trunk Movements Neck, shoulders, hips: None, normal, Overall Severity Severity of abnormal movements (highest score from questions above): None, normal Incapacitation due to abnormal movements: None, normal Patient's awareness of abnormal movements (rate only patient's report): No Awareness, Dental Status Current problems with teeth and/or dentures?: No Does patient usually wear dentures?: No  CIWA:    COWS:     Musculoskeletal: Strength & Muscle Tone: within normal limits Gait & Station: normal Patient leans: N/A  Psychiatric Specialty Exam: Physical Exam  Nursing note and vitals reviewed.   Review of Systems  Psychiatric/Behavioral: Positive for depression. The patient is nervous/anxious.   All other systems reviewed and are negative.   Blood pressure 132/75, pulse (!) 102, temperature 98.9 F (37.2 C), resp. rate 18, height 5' 5.5" (1.664 m), weight 91.6 kg (202 lb), SpO2 100 %.Body mass index is 33.1 kg/m.  General Appearance: Casual and Guarded  Eye Contact:  Fair  Speech:  Normal Rate improving  Volume:  Decreased  Mood:  appears anxious on and off  Affect:  Inappropriate smiles to self on and off   Thought Process:  Irrelevant and Descriptions of Associations:  Circumstantial on and off   Orientation:  Full (Time, Place, and Person)  Thought Content:  Delusions, Hallucinations: Auditory, Paranoid Ideation, Rumination and Tangential continues to be delusional and is seen as tangential at times- is grandiose  Suicidal Thoughts:  No but is delusional and disorganized on and off   Homicidal Thoughts:  No  Memory:  Immediate;   Fair Recent;   Fair Remote;   Fair  Judgement:  Impaired  Insight:  Shallow  Psychomotor Activity:  Restlessness  Concentration:  Concentration: Fair and Attention Span: Fair  Recall:  FiservFair  Fund of Knowledge:  Fair  Language:  Fair  Akathisia:  No  Handed:  Right  AIMS (if indicated):     Assets:  Desire for Improvement Resilience  ADL's:  Intact  Cognition:  WNL  Sleep:  Number of Hours: 2.25   03/31/16 CSW was able to obtain collateral information from his community support - who reported patient's baseline a few months ago was much better , he does have chronic delusions , but he was abel to take care of self and participate better in decision making discussions .        Treatment Plan Summary: Ronald Roman is a 55 year old AA male with history ofparanoid schizophrenia, DM, HTN,who was found walking naked by neighbors, recommended by his case manager to be brought to the hospital for evaluation.  Patient today seen as minimizing his sx, continues to be grandiose , delusional , has sleep issues . Started on a second antipsychotic yesterday to augment the effect of Invega . Will continue treatment.   Will continue today 11/9 /17 plan as below except where it is noted.  Daily contact  with patient to assess and evaluate symptoms and progress in treatment and Medication management    Will reduce Invega to 9 mg po qhs for psychosis. Will continue Geodon 40 mg po bid with meals for augmenting the effect of Invega . Increased  Depakote  ER to 1250 mg po qhs for mood lability , although  Depakote level  -  03/25/16- 72 ( therapeutic) ,  depakote level on 03/30/16- I have reviewed depakote level today - 74- therapeutic. Will discontinue  Doxepin for lack of efficacy. Start Elavil 25 mg po qhs for sleep. Will continue Ativan  1 mg po qhs for anxiety  Will continue to monitor vitals ,medication compliance and treatment side effects while patient is here.  Reviewed labs: EKG - qtc - wnl, TSH- wnl , hba1c- 8- dietician consult once patient is more stable  ,  pl -31.5 ,  lipid panel HDL - low - diet control recommendations to be provided when patient is more stable. CSW will continue  working on disposition. Patient likely will need placement at [facility]. Patient to participate in therapeutic milieu Shaindy Reader, MD 04/01/2016, 1:35 PM

## 2016-04-01 NOTE — NC FL2 (Signed)
[place] MEDICAID FL2 LEVEL OF CARE SCREENING TOOL     IDENTIFICATION  Patient Name: Ronald Roman Birthdate: 28-Mar-1961 Sex: male Admission Date (Current Location): 03/19/2016  [place]  [place] [id number] P Facility and Address:   [facility] [address]  [place] [phone])      Provider Number: [id number]  Attending Physician Name and Address:  Jomarie LongsSaramma Eappen, MD  Relative Name and Phone Number:       Current Level of Care: Hospital [facility] Prior Approval Number:    Date Approved/Denied:   PASRR Number:  [id number]  Discharge Plan: [place] #2   [phone]#1   [phone]    Current Diagnoses: Patient Active Problem List   Diagnosis Date Noted  . Acute psychosis   . Leukocytosis 02/06/2015  . Sleep apnea 02/05/2015  . AKI (acute kidney injury) (HCC) 02/04/2015  . Seizure disorder (HCC) 02/04/2015  . Acute encephalopathy 02/04/2015  . Diabetes mellitus without complication (HCC)   . Hyperlipidemia   . Hypertension   . Paranoid schizophrenia (HCC)     Orientation RESPIRATION BLADDER Height & Weight     Self, Time, Place  Normal Continent Weight: 202 lb (91.6 kg) Height:  5' 5.5" (166.4 cm)  BEHAVIORAL SYMPTOMS/MOOD NEUROLOGICAL BOWEL NUTRITION STATUS      Continent  (Regular diet)  AMBULATORY STATUS COMMUNICATION OF NEEDS Skin   Independent Verbally Normal                       Personal Care Assistance Level of Assistance  Bathing, Feeding, Dressing Bathing Assistance: Independent Feeding assistance: Independent Dressing Assistance: Independent     Functional Limitations Info   (None)          SPECIAL CARE FACTORS FREQUENCY                       Contractures Contractures Info: Not present    Additional Factors Info  Psychotropic     Psychotropic Info: Been referred to ACT team         Current Medications (04/26/2016):  This is the current hospital  active medication list Current Facility-Administered Medications  Medication Dose Route Frequency Provider Last Rate Last Dose  . acetaminophen (TYLENOL) tablet 650 mg  650 mg Oral Q6H PRN Kristeen MansFran E Hobson, NP   650 mg at 03/21/16 0329  . amantadine (SYMMETREL) capsule 100 mg  100 mg Oral BID Laveda AbbeLaurie Britton Parks, NP   100 mg at 04/26/16 [id number]  . cloZAPine (CLOZARIL) tablet 12.5 mg  12.5 mg Oral Daily Saramma Eappen, MD   12.5 mg at 04/26/16 0815  . cloZAPine (CLOZARIL) tablet 125 mg  125 mg Oral QHS Jomarie LongsSaramma Eappen, MD   125 mg at 04/25/16 2040  . divalproex (DEPAKOTE ER) 24 hr tablet 1,250 mg  1,250 mg Oral QHS Jomarie LongsSaramma Eappen, MD   1,250 mg at 04/25/16 2040  . hydrocerin (EUCERIN) cream   Topical TID Jomarie LongsSaramma Eappen, MD   1 application at 04/26/16 1149  . insulin aspart (novoLOG) injection 0-15 Units  0-15 Units Subcutaneous TID WC Jomarie LongsSaramma Eappen, MD   3 Units at 04/26/16 1152  . insulin aspart (novoLOG) injection 0-5 Units  0-5 Units Subcutaneous QHS Jomarie LongsSaramma Eappen, MD   2 Units at 04/25/16 2046  . insulin aspart (novoLOG) injection 4 Units  4 Units Subcutaneous TID WC Jomarie LongsSaramma Eappen, MD   4 Units at 04/26/16 1150  . insulin  glargine (LANTUS) injection 20 Units  20 Units Subcutaneous QHS Jomarie LongsSaramma Eappen, MD   20 Units at 04/25/16 2045  . lisinopril (PRINIVIL,ZESTRIL) tablet 20 mg  20 mg Oral Daily Jomarie LongsSaramma Eappen, MD   20 mg at 04/26/16 0814  . nicotine polacrilex (NICORETTE) gum 2 mg  2 mg Oral PRN Jomarie LongsSaramma Eappen, MD   2 mg at 04/20/16 0835  . OLANZapine (ZYPREXA) tablet 10 mg  10 mg Oral BID PRN Jomarie LongsSaramma Eappen, MD   10 mg at 04/08/16 0839   Or  . OLANZapine (ZYPREXA) injection 10 mg  10 mg Intramuscular BID PRN Jomarie LongsSaramma Eappen, MD      . temazepam (RESTORIL) capsule 15 mg  15 mg Oral QHS Jomarie LongsSaramma Eappen, MD   15 mg at 04/25/16 2039     Discharge Medications: Please see discharge summary for a list of discharge medications.  Relevant Imaging Results:  Relevant Lab Results:   Additional  Information    Ida Rogueodney B Luria Rosario, LCSW

## 2016-04-01 NOTE — Progress Notes (Signed)
Inpatient Diabetes Program Recommendations  AACE/ADA: New Consensus Statement on Inpatient Glycemic Control (2015)  Target Ranges:  Prepandial:   less than 140 mg/dL      Peak postprandial:   less than 180 mg/dL (1-2 hours)      Critically ill patients:  140 - 180 mg/dL   Results for Osvaldo AngstOWNSEND, Amed D (MRN [id number]) as of 04/01/2016 07:21  Ref. Range 03/31/2016 06:21 03/31/2016 12:06 03/31/2016 17:12 03/31/2016 20:24  Glucose-Capillary Latest Ref Range: 65 - 99 mg/dL 409194 (H) 811172 (H) 914239 (H) 257 (H)   Results for Osvaldo AngstOWNSEND, Terin D (MRN [id number]) as of 04/01/2016 07:21  Ref. Range 04/01/2016 06:06  Glucose-Capillary Latest Ref Range: 65 - 99 mg/dL 086223 (H)    Admit with: Paranoid Schizophrenia  History: DM2  Home DM Meds: Lantus 10 units QHS  Current Insulin Orders: Lantus 10 units QHS                                       Novolog Sensitive Correction Scale/ SSI (0-9 units) TID AC       MD- Please consider the following in-hospital insulin adjustments:  1. Increase Lantus to 15 units QHS  2. Increase Novolog Correction Scale/ SSI to Moderate scale (0-15 units) TID AC + HS (currently ordered as Sensitive scale)      --Will follow patient during hospitalization--  Ambrose FinlandJeannine Johnston Cayman Brogden RN, MSN, CDE Diabetes Coordinator Inpatient Glycemic Control Team Team Pager: [phone] (8a-5p)

## 2016-04-01 NOTE — Progress Notes (Signed)
DAR NOTE: Patient presents with anxious affect and mood.  Denies pain, auditory and visual hallucinations.  Rates depression at 0, hopelessness at 0, and anxiety at 0.  Maintained on routine safety checks.  Medications given as prescribed.  Support and encouragement offered as needed.  Attended group and participated.   Patient was visible in milieu.  Patient remained preoccupied with discharge.  Staff reports patient not following dietary recommendation for diabetic control.  Patient continues to go for several servings.  Patient educated about portion control and dietary recommendation.  Patient verbalizes understanding.

## 2016-04-01 NOTE — Progress Notes (Signed)
Recreation Therapy Notes  Date: 04/01/16 Time: 1000 Location: 500 [address] Dayroom  Group Topic: Coping Skills  Goal Area(s) Addresses:  Pt will be able to identify positive coping skills. Pt will be able to identify the importance of using coping skills. Pt will be able to identify what changes for them if they use coping skills post d/c.  Behavioral Response: Engaged  Intervention: Magazines, worksheets, glue sticks, scissors  Activity: Engineer, siteCoping Skills worksheet.  Patients were to identify coping skills that can be used for diversions, socially, cognitively, tension releasers and physically.  Pt were to identify at least two coping skills for each category.    Education: PharmacologistCoping Skills, Building control surveyorDischarge Planning.   Education Outcome: Acknowledges understanding/In group clarification offered/Needs additional education.   Clinical Observations/Feedback: Pt was active and on task throughout group.  Pt identified making a scrapbook and taking pictures as coping skills.  Pt stated using his coping skills will help "take my mind off of things".    Caroll RancherMarjette Holley Wirt, LRT/CTRS      Caroll RancherLindsay, Erlene Devita A 04/01/2016 11:52 AM

## 2016-04-01 NOTE — Progress Notes (Signed)
D: Pt was in the hallway upon initial approach.  Pt presents with preoccupied affect and pleasant mood.  Pt continues to have grandiose delusions and his speech is tangential.  Pt repeatedly informed staff "I'm leaving tonight, the police coming to pick me up."  Pt states "I gotta get back to work, I got an appointment."  Pt denies SI/HI, denies hallucinations, denies pain.  Pt has been visible in milieu interacting with peers and staff appropriately.  Pt attended evening group.   A: Introduced self to pt.  Actively listened to pt and offered support and encouragement. Medications administered per order.  R: Pt is safe on the unit.  Pt is compliant with medications.  Pt verbally contracts for safety.  Will continue to monitor and assess.

## 2016-04-01 NOTE — [facility] Group Notes (Signed)
[facility] Group Notes:  (Counselor/Nursing/MHT/Case Management/Adjunct)  04/01/2016 1:15PM  Type of Therapy:  Group Therapy  Participation Level:  Active  Participation Quality:  Appropriate  Affect:  Flat  Cognitive:  Oriented  Insight:  Improving  Engagement in Group:  Limited  Engagement in Therapy:  Limited  Modes of Intervention:  Discussion, Exploration and Socialization  Summary of Progress/Problems: The topic for group was balance in life.  Pt participated in the discussion about when their life was in balance and out of balance and how this feels.  Pt discussed ways to get back in balance and short term goals they can work on to get where they want to be. Remained in group longer than usual, was attentive while there. But, as usual, left early, returned for his bags, and left for good.   Daryel Geraldorth, Warren Kugelman B 04/01/2016 1:10 PM

## 2016-04-02 LAB — GLUCOSE, CAPILLARY
GLUCOSE-CAPILLARY: 248 mg/dL — AB (ref 65–99)
GLUCOSE-CAPILLARY: 162 mg/dL — AB (ref 65–99)
Glucose-Capillary: 294 mg/dL — ABNORMAL HIGH (ref 65–99)
Glucose-Capillary: 262 mg/dL — ABNORMAL HIGH (ref 65–99)

## 2016-04-02 MED ORDER — ZIPRASIDONE HCL 60 MG PO CAPS
60.0000 mg | ORAL_CAPSULE | Freq: Two times a day (BID) | ORAL | Status: DC
  Administered 2016-04-02 – 2016-04-03 (×2): 60 mg via ORAL
  Filled 2016-04-02 (×6): qty 1

## 2016-04-02 MED ORDER — INSULIN ASPART 100 UNIT/ML ~~LOC~~ SOLN
4.0000 [IU] | Freq: Three times a day (TID) | SUBCUTANEOUS | Status: DC
  Administered 2016-04-02 – 2016-04-27 (×71): 4 [IU] via SUBCUTANEOUS

## 2016-04-02 MED ORDER — INSULIN GLARGINE 100 UNIT/ML ~~LOC~~ SOLN
20.0000 [IU] | Freq: Every day | SUBCUTANEOUS | Status: DC
  Administered 2016-04-02 – 2016-04-26 (×25): 20 [IU] via SUBCUTANEOUS

## 2016-04-02 NOTE — Progress Notes (Signed)
DAR NOTE: Patient presents with anxious affect and depressed mood.  Denies pain, auditory and visual hallucinations.  Rates depression at 0, hopelessness at 0, and anxiety at 0.  Described energy level as normal and concentration as good.  Maintained on routine safety checks.  Medications given as prescribed.  Support and encouragement offered as needed.  Attended group and participated.  States goal for today is "discharge."  Patient pacing back and forth on the unit.  Patient continues to be preoccupied with discharge.  Offered no complaint.

## 2016-04-02 NOTE — Progress Notes (Signed)
Inpatient Diabetes Program Recommendations  AACE/ADA: New Consensus Statement on Inpatient Glycemic Control (2015)  Target Ranges:  Prepandial:   less than 140 mg/dL      Peak postprandial:   less than 180 mg/dL (1-2 hours)      Critically ill patients:  140 - 180 mg/dL   Results for Ronald Roman, Ronald Roman (MRN [id number]) as of 04/02/2016 07:39  Ref. Range 04/01/2016 06:06 04/01/2016 11:28 04/01/2016 16:37 04/01/2016 21:39  Glucose-Capillary Latest Ref Range: 65 - 99 mg/dL 409223 (H) 811201 (H) 914325 (H) 369 (H)   Results for Ronald Roman, Ronald Roman (MRN [id number]) as of 04/02/2016 07:39  Ref. Range 04/02/2016 06:11  Glucose-Capillary Latest Ref Range: 65 - 99 mg/dL 086294 (H)    Admit with: Paranoid Schizophrenia  History: DM2  Home DM Meds: Lantus 10 units QHS  Current Insulin Orders: Lantus 15 units QHS Novolog Moderate Correction Scale/ SSI (0-15 units) TID AC + HS     MD- Please consider the following in-hospital insulin adjustments:  1. Increase Lantus to 20 units QHS  2. Start Novolog Meal Coverage: Novolog 4 units TIDWC (hold if pt eats <50% of meal)      --Will follow patient during hospitalization--  Ambrose FinlandJeannine Johnston Quintavia Rogstad RN, MSN, CDE Diabetes Coordinator Inpatient Glycemic Control Team Team Pager: [phone] (8a-5p)

## 2016-04-02 NOTE — Progress Notes (Signed)
[facility]  04/02/2016 1:02 PM Ronald Roman  MRN:  [id number] Subjective: Patient reports " I want to go home. I cannot deal with people who break the ten commandments . The ten commandment is being broken all over the world and they are killing and cheating and lying."     Objective: Ronald Roman is a 55 year old AA male with history ofparanoid schizophrenia, DM, HTN,who was found walking naked by neighbors, recommended by his case manager to be brought to the hospital for evaluation.  Patient seen and chart reviewed.Discussed patient with treatment team.  Patient today seen as very animated , anxious , has pressured speech that is delusional and tangential. This is very different from his presentation last few days - when patient was mostly observed as calm . Pt per RN - had conflict with a peer , he became anxious and paranoid after that. Will continue to provide reassurance and support.       Principal Problem: Paranoid schizophrenia (HCC) Diagnosis:   Patient Active Problem List   Diagnosis Date Noted  . Acute psychosis [F23]   . Leukocytosis [D72.829] 02/06/2015  . Sleep apnea [G47.30] 02/05/2015  . AKI (acute kidney injury) (HCC) [N17.9] 02/04/2015  . Seizure disorder (HCC) [G40.909] 02/04/2015  . Acute encephalopathy [G93.40] 02/04/2015  . Diabetes mellitus without complication (HCC) [E11.9]   . Hyperlipidemia [E78.5]   . Hypertension [I10]   . Paranoid schizophrenia (HCC) [F20.0]    Total Time spent with patient: 25 minutes  Past Psychiatric History: Please see H&P.   Past Medical History:  Past Medical History:  Diagnosis Date  . Diabetes mellitus without complication (HCC)   . GERD (gastroesophageal reflux disease)   . Hyperlipidemia   . Hypertension   . Paranoid schizophrenia (HCC)   . Sleep apnea   . Uric acid nephrolithiasis    History reviewed. No pertinent surgical history. Family History: Patient denies Family Psychiatric   History: Pt denies Social History: Patient reports he [place] , is single , has children who does not live with him , reports he gets food stamps . History  Alcohol Use No     History  Drug Use No    Social History   Social History  . Marital status: Single    Spouse name: N/A  . Number of children: N/A  . Years of education: N/A   Social History Main Topics  . Smoking status: Current Every Day Smoker    Packs/day: 1.00    Types: Cigarettes  . Smokeless tobacco: Never Used  . Alcohol use No  . Drug use: No  . Sexual activity: Not Currently   Other Topics Concern  . None   Social History Narrative  . None   Additional Social History:                         Sleep: Fair  Appetite:  Fair  Current Medications: Current Facility-Administered Medications  Medication Dose Route Frequency Provider Last Rate Last Dose  . acetaminophen (TYLENOL) tablet 650 mg  650 mg Oral Q6H PRN Kristeen MansFran E Hobson, NP   650 mg at 03/21/16 0329  . amantadine (SYMMETREL) capsule 100 mg  100 mg Oral BID Laveda AbbeLaurie Britton Parks, NP   100 mg at 04/02/16 0804  . amitriptyline (ELAVIL) tablet 25 mg  25 mg Oral QHS Jomarie LongsSaramma Lyzette Reinhardt, MD   25 mg at 04/01/16 2151  . divalproex (DEPAKOTE ER)  24 hr tablet 1,250 mg  1,250 mg Oral QHS Jomarie Longs, MD   1,250 mg at 04/01/16 2151  . insulin aspart (novoLOG) injection 0-15 Units  0-15 Units Subcutaneous TID WC Jomarie Longs, MD   5 Units at 04/02/16 1207  . insulin aspart (novoLOG) injection 0-5 Units  0-5 Units Subcutaneous QHS Jomarie Longs, MD   5 Units at 04/01/16 2158  . insulin aspart (novoLOG) injection 4 Units  4 Units Subcutaneous TID WC Adlee Paar, MD      . insulin glargine (LANTUS) injection 20 Units  20 Units Subcutaneous QHS Jamesia Linnen, MD      . lisinopril (PRINIVIL,ZESTRIL) tablet 10 mg  10 mg Oral Daily Kristeen Mans, NP   10 mg at 04/02/16 0804  . LORazepam (ATIVAN) tablet 1 mg  1 mg Oral QHS Jomarie Longs, MD   1 mg  at 04/01/16 2151  . magnesium hydroxide (MILK OF MAGNESIA) suspension 30 mL  30 mL Oral Daily Kerry Hough, PA-C   30 mL at 04/02/16 0804  . nicotine polacrilex (NICORETTE) gum 2 mg  2 mg Oral PRN Jomarie Longs, MD      . OLANZapine (ZYPREXA) tablet 5 mg  5 mg Oral TID PRN Jomarie Longs, MD   5 mg at 04/01/16 0219   Or  . OLANZapine (ZYPREXA) injection 5 mg  5 mg Intramuscular TID PRN Jomarie Longs, MD      . paliperidone (INVEGA) 24 hr tablet 9 mg  9 mg Oral QHS Jomarie Longs, MD   9 mg at 04/01/16 2151  . ziprasidone (GEODON) capsule 60 mg  60 mg Oral BID WC Jomarie Longs, MD        Lab Results:  Results for orders placed or performed during the hospital encounter of 03/19/16 (from the past 48 hour(s))  Glucose, capillary     Status: Abnormal   Collection Time: 03/31/16  5:12 PM  Result Value Ref Range   Glucose-Capillary 239 (H) 65 - 99 mg/dL  Glucose, capillary     Status: Abnormal   Collection Time: 03/31/16  8:24 PM  Result Value Ref Range   Glucose-Capillary 257 (H) 65 - 99 mg/dL  Glucose, capillary     Status: Abnormal   Collection Time: 04/01/16  6:06 AM  Result Value Ref Range   Glucose-Capillary 223 (H) 65 - 99 mg/dL  Glucose, capillary     Status: Abnormal   Collection Time: 04/01/16 11:28 AM  Result Value Ref Range   Glucose-Capillary 201 (H) 65 - 99 mg/dL   Comment 1 Notify RN    Comment 2 Document in Chart   Glucose, capillary     Status: Abnormal   Collection Time: 04/01/16  4:37 PM  Result Value Ref Range   Glucose-Capillary 325 (H) 65 - 99 mg/dL   Comment 1 Notify RN    Comment 2 Document in Chart   Glucose, capillary     Status: Abnormal   Collection Time: 04/01/16  9:39 PM  Result Value Ref Range   Glucose-Capillary 369 (H) 65 - 99 mg/dL  Glucose, capillary     Status: Abnormal   Collection Time: 04/02/16  6:11 AM  Result Value Ref Range   Glucose-Capillary 294 (H) 65 - 99 mg/dL  Glucose, capillary     Status: Abnormal   Collection Time:  04/02/16 11:58 AM  Result Value Ref Range   Glucose-Capillary 248 (H) 65 - 99 mg/dL    Blood Alcohol level:  Lab Results  Component Value Date   [facility] 03/18/2016   ETH <5 03/01/2016    Metabolic Disorder Labs: Lab Results  Component Value Date   HGBA1C 8.0 (H) 03/23/2016   MPG 183 03/23/2016   MPG 183 03/20/2016   Lab Results  Component Value Date   PROLACTIN 31.5 (H) 03/23/2016   PROLACTIN 36.6 (H) 03/20/2016   Lab Results  Component Value Date   CHOL 145 03/23/2016   TRIG 144 03/23/2016   HDL 30 (L) 03/23/2016   CHOLHDL 4.8 03/23/2016   VLDL 29 03/23/2016   LDLCALC 86 03/23/2016   LDLCALC 21 03/20/2016    Physical Findings: AIMS: Facial and Oral Movements Muscles of Facial Expression: None, normal Lips and Perioral Area: None, normal Jaw: None, normal Tongue: None, normal,Extremity Movements Upper (arms, wrists, hands, fingers): None, normal Lower (legs, knees, ankles, toes): None, normal, Trunk Movements Neck, shoulders, hips: None, normal, Overall Severity Severity of abnormal movements (highest score from questions above): None, normal Incapacitation due to abnormal movements: None, normal Patient's awareness of abnormal movements (rate only patient's report): No Awareness, Dental Status Current problems with teeth and/or dentures?: No Does patient usually wear dentures?: No  CIWA:    COWS:     Musculoskeletal: Strength & Muscle Tone: within normal limits Gait & Station: normal Patient leans: N/A   Psychiatric Specialty Exam: Physical Exam  Nursing note and vitals reviewed.   Review of Systems  Psychiatric/Behavioral: The patient is nervous/anxious.   All other systems reviewed and are negative.   Blood pressure 137/76, pulse (!) 104, temperature 98.2 F (36.8 C), temperature source Oral, resp. rate 18, height 5' 5.5" (1.664 m), weight 91.6 kg (202 lb), SpO2 100 %.Body mass index is 33.1 kg/m.  General Appearance: Fairly Groomed  Eye Contact:   Fair  Speech:  Pressured   Volume:  Normal  Mood:  Angry, Anxious and Irritable  Affect:  Labile   Thought Process:  Disorganized, Irrelevant and Descriptions of Associations: Tangential   Orientation:  Full (Time, Place, and Person)  Thought Content:  Delusions, Paranoid Ideation, Rumination and Tangential   Suicidal Thoughts:  No but is delusional and disorganized on and off   Homicidal Thoughts:  No  Memory:  Immediate;   Fair Recent;   Fair Remote;   Fair  Judgement:  Impaired  Insight:  Shallow  Psychomotor Activity:  Restlessness  Concentration:  Concentration: Fair and Attention Span: Fair  Recall:  FiservFair  Fund of Knowledge:  Fair  Language:  Fair  Akathisia:  No  Handed:  Right  AIMS (if indicated):     Assets:  Desire for Improvement Resilience  ADL's:  Intact  Cognition:  WNL  Sleep:  Number of Hours: 4.5   03/31/16 CSW was able to obtain collateral information from his community support - who reported patient's baseline a few months ago was much better , he does have chronic delusions , but he was abel to take care of self and participate better in decision making discussions .        Treatment Plan Summary: Ronald Roman is a 55 year old AA male with history ofparanoid schizophrenia, DM, HTN,who was found walking naked by neighbors, recommended by his case manager to be brought to the hospital for evaluation.  Patient today seen as very animated , angry , anxious after having conflict with another peer. Pt required a lot of redirection and support this AM . Pt continues to be unstable at this time , will continue  to need treatment.    Will continue today 11/10 /17 plan as below except where it is noted.  Daily contact with patient to assess and evaluate symptoms and progress in treatment and Medication management    Reduced Invega to 9 mg po qhs for psychosis. Will increase Geodon to 60 mg po bid with meals for augmenting the effect of Invega  . Increased  Depakote  ER to 1250 mg po qhs for mood lability , although  Depakote level  - 03/25/16- 72 ( therapeutic) ,  depakote level on 03/30/16- I have reviewed depakote level today - 74- therapeutic. Will continue Elavil 25 mg po qhs for sleep. Will continue Ativan  1 mg po qhs for anxiety  Will continue to monitor vitals ,medication compliance and treatment side effects while patient is here.  Reviewed labs: EKG - qtc - wnl, TSH- wnl , hba1c- 8- dietician consult once patient is more stable  ,  pl -31.5 ,  lipid panel HDL - low - diet control recommendations to be provided when patient is more stable. CSW will continue  working on disposition. Patient likely will need placement at [facility]. Patient to participate in therapeutic milieu Demani Mcbrien, MD 04/02/2016, 1:02 PM

## 2016-04-02 NOTE — [facility] Group Notes (Signed)
[facility] LCSW Group Therapy  04/02/2016  1:05 PM  Type of Therapy:  Group therapy  Participation Level:  Active  Participation Quality:  Attentive  Affect:  Flat  Cognitive:  Oriented  Insight:  Limited  Engagement in Therapy:  Limited  Modes of Intervention:  Discussion, Socialization  Summary of Progress/Problems:  Chaplain was here to lead a group on themes of hope and courage. Ronald Roman stayed for the entire group today, limited engagement.  "Caring is fixing fried chicken for your family on Sunday afternoon."  Stated he used to eat fried chicken at church with his church family.  Ronald Roman, Ronald Roman 04/02/2016 1:45 PM

## 2016-04-02 NOTE — Progress Notes (Signed)
D: Pt continues to be very confused, paranoid and delusional; Pt continues to talk to self and laugh inappropriately; states, "People are waiting for me back there; you know they can't states smoking without me." Pt denies any form of depression, anxiety, pain, SI, HI and AVH. Pt is very restless; Pt was observed pacing the hall. A: Medications offered as prescribed.  Support, encouragement, and safe environment provided.  15-minute safety checks continue. R: Pt was med compliant.  Pt karaoke attended group. Safety checks continue.

## 2016-04-02 NOTE — Progress Notes (Signed)
Recreation Therapy Notes  Date: 04/02/16 Time: 1000 Location: 500 Hall Dayroom  Group Topic: Communication, Team Building, Problem Solving  Goal Area(s) Addresses:  Patient will effectively work with peer towards shared goal.  Patient will identify skill used to make activity successful.  Patient will identify how skills used during activity can be used to reach post d/c goals.   Behavioral Response: Engaged  Intervention: STEM Activity   Activity: Glass blower/designeripe Cleaner Tower. In teams, patients were asked to build the tallest freestanding tower possible out of 15 pipe cleaners. Systematically resources were removed, for example patient ability to use both hands and patient ability to verbally communicate.    Education: Pharmacist, communityocial Skills, Building control surveyorDischarge Planning.   Education Outcome: Acknowledges education/In group clarification offered/Needs additional education.   Clinical Observations/Feedback: Pt was more of the leader in his group.  Pt came up with the concept of the groups tower.  Pt had minimal social interaction with peers but was bright and on task throughout group.   Caroll RancherMarjette Gracin [place], LRT/CTRS      Caroll RancherLindsay, Julene Rahn A 04/02/2016 12:11 PM

## 2016-04-03 DIAGNOSIS — F23 Brief psychotic disorder: Secondary | ICD-10-CM

## 2016-04-03 LAB — GLUCOSE, CAPILLARY
GLUCOSE-CAPILLARY: 243 mg/dL — AB (ref 65–99)
Glucose-Capillary: 156 mg/dL — ABNORMAL HIGH (ref 65–99)
Glucose-Capillary: 200 mg/dL — ABNORMAL HIGH (ref 65–99)
Glucose-Capillary: 189 mg/dL — ABNORMAL HIGH (ref 65–99)

## 2016-04-03 MED ORDER — PALIPERIDONE ER 6 MG PO TB24
6.0000 mg | ORAL_TABLET | Freq: Every day | ORAL | Status: DC
  Administered 2016-04-03 – 2016-04-04 (×2): 6 mg via ORAL
  Filled 2016-04-03 (×3): qty 1

## 2016-04-03 MED ORDER — ZIPRASIDONE HCL 80 MG PO CAPS
80.0000 mg | ORAL_CAPSULE | Freq: Two times a day (BID) | ORAL | Status: DC
  Administered 2016-04-03 – 2016-04-05 (×4): 80 mg via ORAL
  Filled 2016-04-03 (×6): qty 1

## 2016-04-03 NOTE — Plan of Care (Signed)
Problem: Coping: Goal: Ability to cope will improve Outcome: Progressing Nurse discussed depression/coping skills with patient.    

## 2016-04-03 NOTE — [facility] Group Notes (Signed)
The focus of this group is to educate the patient on the purpose and policies of crisis stabilization and provide a format to answer questions about their admission.  The group details unit policies and expectations of patients while admitted.  Patient did not attend 0900 nurse education orientation group this morning.  Patient stayed in his room. 

## 2016-04-03 NOTE — Plan of Care (Signed)
Problem: Safety: Goal: Periods of time without injury will increase Outcome: Progressing Safety maintained on unit with 15 min checks and patient has remained free from self harm.

## 2016-04-03 NOTE — Progress Notes (Signed)
D:  Patient's self inventory sheet, patient sleeps good, sleep medication is helpful.  Good appetite, hyper energy level, good concentration.  Denied depression, hopeless and anxiety.  Withdrawals, runny nose, irritability.  SI on form, but denied SI while talking to nurse this morning, contracts for safety.  Physical problems, blurred vision.  Physical pain, pain medication is helpful.  Goal is #10, plan is #10.  Does have discharge plans. A:  Medications administered per MD orders.  Emotional support and encouragement given patient. R:  Patient denied SI and HI this morning while talking to nurse.  Denied A/V hallucinations.  Safety maintained with 15 minute checks.

## 2016-04-03 NOTE — Progress Notes (Signed)
D: Pt continues to be very confused, paranoid and delusional; Pt was in bed for more of the evening." Pt denies any form of depression, anxiety, pain, SI, HI and AVH. Pt is calm and cooperative. Pt remained non-violent. A: Medications offered as prescribed.  Support, encouragement, and safe environment provided.  15-minute safety checks continue. R: Pt was med compliant.  Pt did not attend group. Safety checks continue.

## 2016-04-03 NOTE — Progress Notes (Signed)
Insulin due at 0700 was given by night nurse, confirmed with night nurse and with patient.

## 2016-04-03 NOTE — Progress Notes (Signed)
  Writer spoke with patient 1:1 and he reports that he needs to get home before it gets too late. He believes that he can go home tonight even after Clinical research associatewriter explained to him that he is not discharging. He has his clothes in bags sitting in the hallway waiting to leave. Writer encouraged him to stay tonight and talk with doctor on tomorrow. He disagreed but as long as the subject is not brought up he is fine on the unit. Safety maintained on unit with 15 min checks.

## 2016-04-03 NOTE — [facility] Group Notes (Signed)
[facility] Group Notes: (Clinical Social Work)   04/03/2016      Type of Therapy:  Group Therapy   Participation Level:  Did Not Attend despite MHT prompting - was present at the beginning of group, but left   Ambrose MantleMareida Grossman-Orr, LCSW 04/03/2016, 1:59 PM

## 2016-04-03 NOTE — Progress Notes (Signed)
[facility] MD Progress Note  04/03/2016 12:31 PM Ronald Roman  MRN:  [id number] Subjective: Patient reports " I am Christian.  I am supposed to be discharged.  My ride is waiting for me.  Objective: Ronald Roman is a 55 year old AA male with history ofparanoid schizophrenia, DM, HTN,who was found walking naked by neighbors, recommended by his case manager to be brought to the hospital for evaluation.  Patient seen and chart reviewed. Patient remains very delusional, tangential and inappropriate.  He continued to endorse paranoid ideation.  He appears religiously preoccupied.  His medicines were adjusted.  He is taking Geodon and is in regards reduce.  However he continued to endorse psychotic symptoms with pressured speech, labile affect and having delusions.  He is going to the groups but he has limited participation.  He has no tremors or shakes.  He is tolerating Geodon without any tremors or any concern.  His sleep is okay.    Principal Problem: Paranoid schizophrenia (HCC) Diagnosis:   Patient Active Problem List   Diagnosis Date Noted  . Acute psychosis [F23]   . Leukocytosis [D72.829] 02/06/2015  . Sleep apnea [G47.30] 02/05/2015  . AKI (acute kidney injury) (HCC) [N17.9] 02/04/2015  . Seizure disorder (HCC) [G40.909] 02/04/2015  . Acute encephalopathy [G93.40] 02/04/2015  . Diabetes mellitus without complication (HCC) [E11.9]   . Hyperlipidemia [E78.5]   . Hypertension [I10]   . Paranoid schizophrenia (HCC) [F20.0]    Total Time spent with patient: 25 minutes  Past Psychiatric History: Please see H&P.   Past Medical History:  Past Medical History:  Diagnosis Date  . Diabetes mellitus without complication (HCC)   . GERD (gastroesophageal reflux disease)   . Hyperlipidemia   . Hypertension   . Paranoid schizophrenia (HCC)   . Sleep apnea   . Uric acid nephrolithiasis    History reviewed. No pertinent surgical history. Family History: Patient denies Family  Psychiatric  History: Pt denies Social History: Patient reports he lives in [place] , is single , has children who does not live with him , reports he gets food stamps . History  Alcohol Use No     History  Drug Use No    Social History   Social History  . Marital status: Single    Spouse name: N/A  . Number of children: N/A  . Years of education: N/A   Social History Main Topics  . Smoking status: Current Every Day Smoker    Packs/day: 1.00    Types: Cigarettes  . Smokeless tobacco: Never Used  . Alcohol use No  . Drug use: No  . Sexual activity: Not Currently   Other Topics Concern  . None   Social History Narrative  . None   Additional Social History:     Sleep: Fair  Appetite:  Fair  Current Medications: Current Facility-Administered Medications  Medication Dose Route Frequency Provider Last Rate Last Dose  . acetaminophen (TYLENOL) tablet 650 mg  650 mg Oral Q6H PRN Kristeen Mans, NP   650 mg at 03/21/16 0329  . amantadine (SYMMETREL) capsule 100 mg  100 mg Oral BID Laveda Abbe, NP   100 mg at 04/03/16 0831  . amitriptyline (ELAVIL) tablet 25 mg  25 mg Oral QHS Jomarie Longs, MD   25 mg at 04/02/16 2124  . divalproex (DEPAKOTE ER) 24 hr tablet 1,250 mg  1,250 mg Oral QHS Jomarie Longs, MD   1,250 mg at 04/02/16 2124  . insulin  aspart (novoLOG) injection 0-15 Units  0-15 Units Subcutaneous TID WC Jomarie LongsSaramma Eappen, MD   3 Units at 04/03/16 1157  . insulin aspart (novoLOG) injection 0-5 Units  0-5 Units Subcutaneous QHS Jomarie LongsSaramma Eappen, MD   5 Units at 04/01/16 2158  . insulin aspart (novoLOG) injection 4 Units  4 Units Subcutaneous TID WC Jomarie LongsSaramma Eappen, MD   4 Units at 04/03/16 1201  . insulin glargine (LANTUS) injection 20 Units  20 Units Subcutaneous QHS Jomarie LongsSaramma Eappen, MD   20 Units at 04/02/16 2135  . lisinopril (PRINIVIL,ZESTRIL) tablet 10 mg  10 mg Oral Daily Kristeen MansFran E Hobson, NP   10 mg at 04/03/16 [id number]  . LORazepam (ATIVAN) tablet 1 mg  1 mg Oral  QHS Jomarie LongsSaramma Eappen, MD   1 mg at 04/02/16 2124  . magnesium hydroxide (MILK OF MAGNESIA) suspension 30 mL  30 mL Oral Daily Kerry HoughSpencer E Simon, PA-C   30 mL at 04/03/16 [id number]  . nicotine polacrilex (NICORETTE) gum 2 mg  2 mg Oral PRN Jomarie LongsSaramma Eappen, MD      . OLANZapine (ZYPREXA) tablet 5 mg  5 mg Oral TID PRN Jomarie LongsSaramma Eappen, MD   5 mg at 04/01/16 0219   Or  . OLANZapine (ZYPREXA) injection 5 mg  5 mg Intramuscular TID PRN Jomarie LongsSaramma Eappen, MD      . paliperidone (INVEGA) 24 hr tablet 9 mg  9 mg Oral QHS Jomarie LongsSaramma Eappen, MD   9 mg at 04/02/16 2124  . ziprasidone (GEODON) capsule 60 mg  60 mg Oral BID WC Jomarie LongsSaramma Eappen, MD   60 mg at 04/03/16 0831    Lab Results:  Results for orders placed or performed during the hospital encounter of 03/19/16 (from the past 48 hour(s))  Glucose, capillary     Status: Abnormal   Collection Time: 04/01/16  4:37 PM  Result Value Ref Range   Glucose-Capillary 325 (H) 65 - 99 mg/dL   Comment 1 Notify RN    Comment 2 Document in Chart   Glucose, capillary     Status: Abnormal   Collection Time: 04/01/16  9:39 PM  Result Value Ref Range   Glucose-Capillary 369 (H) 65 - 99 mg/dL  Glucose, capillary     Status: Abnormal   Collection Time: 04/02/16  6:11 AM  Result Value Ref Range   Glucose-Capillary 294 (H) 65 - 99 mg/dL  Glucose, capillary     Status: Abnormal   Collection Time: 04/02/16 11:58 AM  Result Value Ref Range   Glucose-Capillary 248 (H) 65 - 99 mg/dL  Glucose, capillary     Status: Abnormal   Collection Time: 04/02/16  4:59 PM  Result Value Ref Range   Glucose-Capillary 262 (H) 65 - 99 mg/dL   Comment 1 Notify RN    Comment 2 Document in Chart   Glucose, capillary     Status: Abnormal   Collection Time: 04/02/16  8:44 PM  Result Value Ref Range   Glucose-Capillary 162 (H) 65 - 99 mg/dL  Glucose, capillary     Status: Abnormal   Collection Time: 04/03/16  6:11 AM  Result Value Ref Range   Glucose-Capillary 243 (H) 65 - 99 mg/dL  Glucose,  capillary     Status: Abnormal   Collection Time: 04/03/16 11:34 AM  Result Value Ref Range   Glucose-Capillary 156 (H) 65 - 99 mg/dL    Blood Alcohol level:  Lab Results  Component Value Date   ETH <5 03/18/2016   ETH <5 03/01/2016  Metabolic Disorder Labs: Lab Results  Component Value Date   HGBA1C 8.0 (H) 03/23/2016   MPG 183 03/23/2016   MPG 183 03/20/2016   Lab Results  Component Value Date   PROLACTIN 31.5 (H) 03/23/2016   PROLACTIN 36.6 (H) 03/20/2016   Lab Results  Component Value Date   CHOL 145 03/23/2016   TRIG 144 03/23/2016   HDL 30 (L) 03/23/2016   CHOLHDL 4.8 03/23/2016   VLDL 29 03/23/2016   LDLCALC 86 03/23/2016   LDLCALC 21 03/20/2016    Physical Findings: AIMS: Facial and Oral Movements Muscles of Facial Expression: None, normal Lips and Perioral Area: None, normal Jaw: None, normal Tongue: None, normal,Extremity Movements Upper (arms, wrists, hands, fingers): None, normal Lower (legs, knees, ankles, toes): None, normal, Trunk Movements Neck, shoulders, hips: None, normal, Overall Severity Severity of abnormal movements (highest score from questions above): None, normal Incapacitation due to abnormal movements: None, normal Patient's awareness of abnormal movements (rate only patient's report): No Awareness, Dental Status Current problems with teeth and/or dentures?: No Does patient usually wear dentures?: No  CIWA:    COWS:     Musculoskeletal: Strength & Muscle Tone: within normal limits Gait & Station: normal Patient leans: N/A   Psychiatric Specialty Exam: Physical Exam  Nursing note and vitals reviewed.   Review of Systems  Constitutional: Negative.   HENT: Negative.   Cardiovascular: Negative.   Gastrointestinal: Negative.   Skin: Negative.   Neurological: Negative.  Negative for tremors.  Psychiatric/Behavioral: Positive for hallucinations. The patient is nervous/anxious and has insomnia.   All other systems reviewed  and are negative.   Blood pressure 120/73, pulse (!) 109, temperature 98 F (36.7 C), temperature source Oral, resp. rate 16, height 5' 5.5" (1.664 m), weight 91.6 kg (202 lb), SpO2 100 %.Body mass index is 33.1 kg/m.  General Appearance: Fairly Groomed, obese.  Eye Contact:  Fair  Speech:  Pressured   Volume:  Normal  Mood:  Angry, Anxious and Irritable  Affect:  Labile   Thought Process:  Disorganized, Irrelevant and Descriptions of Associations: Tangential recently preoccupied  Orientation:  Full (Time, Place, and Person)  Thought Content:  Delusions, Paranoid Ideation, Rumination and Tangential   Suicidal Thoughts:  No f   Homicidal Thoughts:  No  Memory:  Immediate;   Fair Recent;   Fair Remote;   Fair  Judgement:  Impaired  Insight:  Shallow  Psychomotor Activity:  Restlessness  Concentration:  Concentration: Fair and Attention Span: Fair  Recall:  [employer]Fair  Fund of Knowledge:  Fair  Language:  Fair  Akathisia:  No  Handed:  Right  AIMS (if indicated):     Assets:  Desire for Improvement Resilience  ADL's:  Intact  Cognition:  WNL  Sleep:  Number of Hours: 3.5   03/31/16 CSW was able to obtain collateral information from his community support - who reported patient's baseline a few months ago was much better , he does have chronic delusions , but he was abel to take care of self and participate better in decision making discussions .   Treatment Plan Summary: Osvaldo AngstBarry D Shorey is a 55 year old AA male with history ofparanoid schizophrenia, DM, HTN,who was found walking naked by neighbors, recommended by his case manager to be brought to the hospital for evaluation.  Patient remains very delusional, psychotic and labile.He continues to have conflict with other patients.  He required constant redirection.  Will continue today 11/11 /17 plan as below except where it  is noted.  Daily contact with patient to assess and evaluate symptoms and progress in treatment and  Medication management   Reduced Invega to 6 mg po qhs for psychosis. Will increase Geodon to 80 mg po bid with meals for augmenting the effect of Invega . continue Depakote  ER to 1250 mg po qhs for mood lability , although  Depakote level  - 03/25/16- 72 ( therapeutic) , last Depakote level 74- therapeutic. Will continue Elavil 25 mg po qhs for sleep. Will continue Ativan  1 mg po qhs for anxiety  Will continue to monitor vitals ,medication compliance and treatment side effects while patient is here.  Reviewed labs: EKG - qtc - wnl, TSH- wnl , hba1c- 8- dietician consult once patient is more stable  ,  pl -31.5 ,  lipid panel HDL - low - diet control recommendations to be provided when patient is more stable. CSW will continue  working on disposition. Patient likely will need placement at [facility]. Patient to participate in therapeutic milieu ARFEEN,SYED T., MD 04/03/2016, 12:31 PM

## 2016-04-03 NOTE — Progress Notes (Signed)
Did not attend groups 

## 2016-04-04 LAB — GLUCOSE, CAPILLARY
GLUCOSE-CAPILLARY: 152 mg/dL — AB (ref 65–99)
GLUCOSE-CAPILLARY: 183 mg/dL — AB (ref 65–99)
Glucose-Capillary: 92 mg/dL (ref 65–99)
Glucose-Capillary: 212 mg/dL — ABNORMAL HIGH (ref 65–99)

## 2016-04-04 NOTE — [facility] Group Notes (Signed)
[facility] Group Notes: (Clinical Social Work)   04/04/2016      Type of Therapy:  Group Therapy   Participation Level:  Did Not Attend despite MHT prompting - he came into the room several times for perhaps 30 seconds each time, then left again.   Ambrose MantleMareida Grossman-Orr, LCSW 04/04/2016, 1:12 PM

## 2016-04-04 NOTE — Progress Notes (Signed)
[facility] MD Progress Note  04/04/2016 9:52 AM MACKINLEY KIEHN  MRN:  [id number] Subjective: Patient reports " I'm sleeping better.  Objective: CHRISTYAN REGER is a 55 year old AA male with history ofparanoid schizophrenia, DM, HTN,who was found walking naked by neighbors, recommended by his case manager to be brought to the hospital for evaluation.  Patient seen and chart reviewed. Patient continue to remains delusional, tangential and very labile but overall he had a good night sleep.  Though he denies any hallucination but remains paranoid and guarded and appears recently preoccupied.  He admitted talking to his boys who lives in his body and he feet good when he talked with them. He is easily distracted and started rambling.  He is going to the cruise but he has limited participation.  He is taking his medication but reported no side effects.  His appetite is okay.    Principal Problem: Paranoid schizophrenia (HCC) Diagnosis:   Patient Active Problem List   Diagnosis Date Noted  . Acute psychosis [F23]   . Leukocytosis [D72.829] 02/06/2015  . Sleep apnea [G47.30] 02/05/2015  . AKI (acute kidney injury) (HCC) [N17.9] 02/04/2015  . Seizure disorder (HCC) [G40.909] 02/04/2015  . Acute encephalopathy [G93.40] 02/04/2015  . Diabetes mellitus without complication (HCC) [E11.9]   . Hyperlipidemia [E78.5]   . Hypertension [I10]   . Paranoid schizophrenia (HCC) [F20.0]    Total Time spent with patient: 25 minutes  Past Psychiatric History: Please see H&P.   Past Medical History:  Past Medical History:  Diagnosis Date  . Diabetes mellitus without complication (HCC)   . GERD (gastroesophageal reflux disease)   . Hyperlipidemia   . Hypertension   . Paranoid schizophrenia (HCC)   . Sleep apnea   . Uric acid nephrolithiasis    History reviewed. No pertinent surgical history. Family History: Patient denies Family Psychiatric  History: Pt denies Social History: Patient reports he lives  in [place] , is single , has children who does not live with him , reports he gets food stamps . History  Alcohol Use No     History  Drug Use No    Social History   Social History  . Marital status: Single    Spouse name: N/A  . Number of children: N/A  . Years of education: N/A   Social History Main Topics  . Smoking status: Current Every Day Smoker    Packs/day: 1.00    Types: Cigarettes  . Smokeless tobacco: Never Used  . Alcohol use No  . Drug use: No  . Sexual activity: Not Currently   Other Topics Concern  . None   Social History Narrative  . None   Additional Social History:     Sleep: Fair  Appetite:  Fair  Current Medications: Current Facility-Administered Medications  Medication Dose Route Frequency Provider Last Rate Last Dose  . acetaminophen (TYLENOL) tablet 650 mg  650 mg Oral Q6H PRN Kristeen Mans, NP   650 mg at 03/21/16 0329  . amantadine (SYMMETREL) capsule 100 mg  100 mg Oral BID Laveda Abbe, NP   100 mg at 04/04/16 0759  . amitriptyline (ELAVIL) tablet 25 mg  25 mg Oral QHS Jomarie Longs, MD   25 mg at 04/03/16 2146  . divalproex (DEPAKOTE ER) 24 hr tablet 1,250 mg  1,250 mg Oral QHS Jomarie Longs, MD   1,250 mg at 04/03/16 2145  . insulin aspart (novoLOG) injection 0-15 Units  0-15 Units Subcutaneous TID WC  Jomarie Longs, MD   3 Units at 04/04/16 [phone]  . insulin aspart (novoLOG) injection 0-5 Units  0-5 Units Subcutaneous QHS Jomarie Longs, MD   5 Units at 04/01/16 2158  . insulin aspart (novoLOG) injection 4 Units  4 Units Subcutaneous TID WC Jomarie Longs, MD   4 Units at 04/04/16 0608  . insulin glargine (LANTUS) injection 20 Units  20 Units Subcutaneous QHS Jomarie Longs, MD   20 Units at 04/03/16 2146  . lisinopril (PRINIVIL,ZESTRIL) tablet 10 mg  10 mg Oral Daily Kristeen Mans, NP   10 mg at 04/04/16 0759  . LORazepam (ATIVAN) tablet 1 mg  1 mg Oral QHS Jomarie Longs, MD   1 mg at 04/03/16 2146  . magnesium hydroxide  (MILK OF MAGNESIA) suspension 30 mL  30 mL Oral Daily Kerry Hough, PA-C   30 mL at 04/04/16 0759  . nicotine polacrilex (NICORETTE) gum 2 mg  2 mg Oral PRN Jomarie Longs, MD      . OLANZapine (ZYPREXA) tablet 5 mg  5 mg Oral TID PRN Jomarie Longs, MD   5 mg at 04/01/16 0219   Or  . OLANZapine (ZYPREXA) injection 5 mg  5 mg Intramuscular TID PRN Jomarie Longs, MD      . paliperidone (INVEGA) 24 hr tablet 6 mg  6 mg Oral QHS Cleotis Nipper, MD   6 mg at 04/03/16 2146  . ziprasidone (GEODON) capsule 80 mg  80 mg Oral BID WC Cleotis Nipper, MD   80 mg at 04/04/16 0759    Lab Results:  Results for orders placed or performed during the hospital encounter of 03/19/16 (from the past 48 hour(s))  Glucose, capillary     Status: Abnormal   Collection Time: 04/02/16 11:58 AM  Result Value Ref Range   Glucose-Capillary 248 (H) 65 - 99 mg/dL  Glucose, capillary     Status: Abnormal   Collection Time: 04/02/16  4:59 PM  Result Value Ref Range   Glucose-Capillary 262 (H) 65 - 99 mg/dL   Comment 1 Notify RN    Comment 2 Document in Chart   Glucose, capillary     Status: Abnormal   Collection Time: 04/02/16  8:44 PM  Result Value Ref Range   Glucose-Capillary 162 (H) 65 - 99 mg/dL  Glucose, capillary     Status: Abnormal   Collection Time: 04/03/16  6:11 AM  Result Value Ref Range   Glucose-Capillary 243 (H) 65 - 99 mg/dL  Glucose, capillary     Status: Abnormal   Collection Time: 04/03/16 11:34 AM  Result Value Ref Range   Glucose-Capillary 156 (H) 65 - 99 mg/dL  Glucose, capillary     Status: Abnormal   Collection Time: 04/03/16  4:59 PM  Result Value Ref Range   Glucose-Capillary 200 (H) 65 - 99 mg/dL   Comment 1 Notify RN    Comment 2 Document in Chart   Glucose, capillary     Status: Abnormal   Collection Time: 04/03/16  8:41 PM  Result Value Ref Range   Glucose-Capillary 189 (H) 65 - 99 mg/dL  Glucose, capillary     Status: Abnormal   Collection Time: 04/04/16  6:04 AM  Result  Value Ref Range   Glucose-Capillary 152 (H) 65 - 99 mg/dL    Blood Alcohol level:  Lab Results  Component Value Date   [facility] <5 03/18/2016   ETH <5 03/01/2016    Metabolic Disorder Labs: Lab Results  Component  Value Date   HGBA1C 8.0 (H) 03/23/2016   MPG 183 03/23/2016   MPG 183 03/20/2016   Lab Results  Component Value Date   PROLACTIN 31.5 (H) 03/23/2016   PROLACTIN 36.6 (H) 03/20/2016   Lab Results  Component Value Date   CHOL 145 03/23/2016   TRIG 144 03/23/2016   HDL 30 (L) 03/23/2016   CHOLHDL 4.8 03/23/2016   VLDL 29 03/23/2016   LDLCALC 86 03/23/2016   LDLCALC 21 03/20/2016    Physical Findings: AIMS: Facial and Oral Movements Muscles of Facial Expression: None, normal Lips and Perioral Area: None, normal Jaw: None, normal Tongue: None, normal,Extremity Movements Upper (arms, wrists, hands, fingers): None, normal Lower (legs, knees, ankles, toes): None, normal, Trunk Movements Neck, shoulders, hips: None, normal, Overall Severity Severity of abnormal movements (highest score from questions above): None, normal Incapacitation due to abnormal movements: None, normal Patient's awareness of abnormal movements (rate only patient's report): No Awareness, Dental Status Current problems with teeth and/or dentures?: No Does patient usually wear dentures?: No  CIWA:  CIWA-Ar Total: 1 COWS:  COWS Total Score: 3  Musculoskeletal: Strength & Muscle Tone: within normal limits Gait & Station: normal Patient leans: N/A   Psychiatric Specialty Exam: Physical Exam  Nursing note and vitals reviewed.   Review of Systems  Constitutional: Negative.   HENT: Negative.   Cardiovascular: Negative.   Gastrointestinal: Negative.   Skin: Negative.   Neurological: Negative.  Negative for tremors.  Psychiatric/Behavioral: Positive for hallucinations. The patient is nervous/anxious.   All other systems reviewed and are negative.   Blood pressure 113/71, pulse (!) 104,  temperature 98.7 F (37.1 C), resp. rate 18, height 5' 5.5" (1.664 m), weight 91.6 kg (202 lb), SpO2 100 %.Body mass index is 33.1 kg/m.  General Appearance: Fairly Groomed, obese.  Eye Contact:  Fair  Speech:  Pressured   Volume:  Normal  Mood:  Irritable  Affect:  Labile   Thought Process:  Disorganized, Irrelevant and Descriptions of Associations: Tangential religiously preoccupied  Orientation:  Full (Time, Place, and Person)  Thought Content:  Delusions, Paranoid Ideation, Rumination and Tangential   Suicidal Thoughts:  No    Homicidal Thoughts:  No  Memory:  Immediate;   Fair Recent;   Fair Remote;   Fair  Judgement:  Impaired  Insight:  Shallow  Psychomotor Activity:  Restlessness  Concentration:  Concentration: Fair and Attention Span: Fair  Recall:  [employer]ervFair  Fund of Knowledge:  Fair  Language:  Fair  Akathisia:  No  Handed:  Right  AIMS (if indicated):     Assets:  Desire for Improvement Resilience  ADL's:  Intact  Cognition:  WNL  Sleep:  Number of Hours: 4.75   03/31/16 CSW was able to obtain collateral information from his community support - who reported patient's baseline a few months ago was much better , he does have chronic delusions , but he was abel to take care of self and participate better in decision making discussions .   Treatment Plan Summary: Osvaldo AngstBarry D Laube is a 55 year old AA male with history ofparanoid schizophrenia, DM, HTN,who was found walking naked by neighbors, recommended by his case manager to be brought to the hospital for evaluation.  Patient remains very delusional, psychotic and labile.He continues to have conflict with other patients.  He required constant redirection.  Will continue today 11/12 /17 plan as below except where it is noted.  Daily contact with patient to assess and evaluate symptoms and  progress in treatment and Medication management   continue Invega to 6 mg po qhs for psychosis. continue Geodon to 80 mg po bid  with meals for augmenting the effect of Invega .patient reported no side effects. continue Depakote  ER to 1250 mg po qhs for mood lability , although  Depakote level  - 03/25/16- 72 ( therapeutic) , last Depakote level 74- therapeutic. Will continue Elavil 25 mg po qhs for sleep. Will continue Ativan  1 mg po qhs for anxiety  Will continue to monitor vitals ,medication compliance and treatment side effects while patient is here.  Reviewed labs: EKG - qtc - wnl, TSH- wnl , hba1c- 8- dietician consult once patient is more stable  ,  pl -31.5 ,  lipid panel HDL - low - diet control recommendations to be provided when patient is more stable. CSW will continue  working on disposition. [facility]. Patient to participate in therapeutic milieu Jontavious Commons T., MD 04/04/2016, 9:52 AM

## 2016-04-04 NOTE — Progress Notes (Signed)
DAR NOTE: Patient presents with depressed affect and mood.  Denies pain, auditory and visual hallucinations.  Rates depression at 0, hopelessness at 0, and anxiety at 0.  Described energy level as hyper and concentration as good.  Maintained on routine safety checks.  Medications given as prescribed.  Support and encouragement offered as needed.  Attended group and participated.  Patient was visible in milieu.  No interaction with staff or peers.  Patient remained preoccupied with discharge.  Offered no complaint.

## 2016-04-05 LAB — CBC WITH DIFFERENTIAL/PLATELET
BASOS ABS: 0 10*3/uL (ref 0.0–0.1)
BASOS PCT: 0 %
EOS PCT: 1 %
Eosinophils Absolute: 0.1 10*3/uL (ref 0.0–0.7)
HEMATOCRIT: 34 % — AB (ref 39.0–52.0)
HEMOGLOBIN: 11.3 g/dL — AB (ref 13.0–17.0)
LYMPHS ABS: 2.9 10*3/uL (ref 0.7–4.0)
LYMPHS PCT: 23 %
MCH: 26.5 pg (ref 26.0–34.0)
MCHC: 33.2 g/dL (ref 30.0–36.0)
MCV: 79.6 fL (ref 78.0–100.0)
MONOS PCT: 9 %
Monocytes Absolute: 1.2 10*3/uL — ABNORMAL HIGH (ref 0.1–1.0)
NEUTROS ABS: 8.6 10*3/uL — AB (ref 1.7–7.7)
Neutrophils Relative %: 67 %
Platelets: 344 10*3/uL (ref 150–400)
RBC: 4.27 MIL/uL (ref 4.22–5.81)
RDW: 14.1 % (ref 11.5–15.5)
WBC: 12.8 10*3/uL — ABNORMAL HIGH (ref 4.0–10.5)

## 2016-04-05 LAB — GLUCOSE, CAPILLARY
GLUCOSE-CAPILLARY: 143 mg/dL — AB (ref 65–99)
Glucose-Capillary: 92 mg/dL (ref 65–99)
Glucose-Capillary: 111 mg/dL — ABNORMAL HIGH (ref 65–99)
Glucose-Capillary: 143 mg/dL — ABNORMAL HIGH (ref 65–99)

## 2016-04-05 MED ORDER — CLOZAPINE 25 MG PO TABS
25.0000 mg | ORAL_TABLET | Freq: Every day | ORAL | Status: DC
  Administered 2016-04-05 – 2016-04-06 (×2): 25 mg via ORAL
  Filled 2016-04-05 (×3): qty 1

## 2016-04-05 MED ORDER — ZIPRASIDONE HCL 40 MG PO CAPS
40.0000 mg | ORAL_CAPSULE | Freq: Two times a day (BID) | ORAL | Status: AC
  Administered 2016-04-05: 40 mg via ORAL
  Filled 2016-04-05: qty 1

## 2016-04-05 MED ORDER — ZIPRASIDONE HCL 40 MG PO CAPS: 40.0000 mg | ORAL_CAPSULE | Freq: Two times a day (BID) | ORAL | Status: DC

## 2016-04-05 MED ORDER — OLANZAPINE 10 MG PO TABS
10.0000 mg | ORAL_TABLET | Freq: Two times a day (BID) | ORAL | Status: DC | PRN
  Administered 2016-04-08: 10 mg via ORAL
  Filled 2016-04-05: qty 1

## 2016-04-05 MED ORDER — OLANZAPINE 10 MG IM SOLR: 10.0000 mg | Freq: Two times a day (BID) | INTRAMUSCULAR | Status: DC | PRN

## 2016-04-05 MED ORDER — TEMAZEPAM 15 MG PO CAPS
15.0000 mg | ORAL_CAPSULE | Freq: Every day | ORAL | Status: DC
  Administered 2016-04-05 – 2016-04-26 (×22): 15 mg via ORAL
  Filled 2016-04-05 (×22): qty 1

## 2016-04-05 NOTE — Progress Notes (Signed)
Patient has been pacing the unit for the majority of the shift.  Patient was noted to be having visual hallucinations of spider webs.  Patient was also noted to be responding to unseen other.  Patient did not engage Clinical research associatewriter in 1:1 staff talk.   Assess patient for safety, offer medications as prescribed, engage patient in 1:1 staff talks.   Continue to monitor as prescribed,

## 2016-04-05 NOTE — Tx Team (Signed)
Interdisciplinary Treatment and Diagnostic Plan Update  04/05/2016 Time of Session: 10:13 AM  Ronald Roman MRN: [id number]  Principal Diagnosis: Paranoid schizophrenia [facility])  Secondary Diagnoses: Principal Problem:   Paranoid schizophrenia ([place])   Current Medications:  Current Facility-Administered Medications  Medication Dose Route Frequency Provider Last Rate Last Dose  . acetaminophen (TYLENOL) tablet 650 mg  650 mg Oral Q6H PRN Lurena Nida, NP   650 mg at 03/21/16 0329  . amantadine (SYMMETREL) capsule 100 mg  100 mg Oral BID Ethelene Hal, NP   100 mg at 04/05/16 0827  . amitriptyline (ELAVIL) tablet 25 mg  25 mg Oral QHS Ursula Alert, MD   25 mg at 04/04/16 2114  . divalproex (DEPAKOTE ER) 24 hr tablet 1,250 mg  1,250 mg Oral QHS Ursula Alert, MD   1,250 mg at 04/04/16 2114  . insulin aspart (novoLOG) injection 0-15 Units  0-15 Units Subcutaneous TID WC Ursula Alert, MD   5 Units at 04/04/16 1705  . insulin aspart (novoLOG) injection 0-5 Units  0-5 Units Subcutaneous QHS Ursula Alert, MD   5 Units at 04/01/16 2158  . insulin aspart (novoLOG) injection 4 Units  4 Units Subcutaneous TID WC Ursula Alert, MD   4 Units at 04/05/16 [phone]  . insulin glargine (LANTUS) injection 20 Units  20 Units Subcutaneous QHS Ursula Alert, MD   20 Units at 04/05/16 0006  . lisinopril (PRINIVIL,ZESTRIL) tablet 10 mg  10 mg Oral Daily Lurena Nida, NP   10 mg at 04/05/16 0827  . LORazepam (ATIVAN) tablet 1 mg  1 mg Oral QHS Ursula Alert, MD   1 mg at 04/04/16 2114  . magnesium hydroxide (MILK OF MAGNESIA) suspension 30 mL  30 mL Oral Daily Laverle Hobby, PA-C   30 mL at 04/05/16 0827  . nicotine polacrilex (NICORETTE) gum 2 mg  2 mg Oral PRN Ursula Alert, MD      . OLANZapine (ZYPREXA) tablet 5 mg  5 mg Oral TID PRN Ursula Alert, MD   5 mg at 04/01/16 0219   Or  . OLANZapine (ZYPREXA) injection 5 mg  5 mg Intramuscular TID PRN Ursula Alert, MD      . paliperidone  (INVEGA) 24 hr tablet 6 mg  6 mg Oral QHS Kathlee Nations, MD   6 mg at 04/04/16 2114  . ziprasidone (GEODON) capsule 80 mg  80 mg Oral BID WC Kathlee Nations, MD   80 mg at 04/05/16 0827    PTA Medications: Prescriptions Prior to Admission  Medication Sig Dispense Refill Last Dose  . albuterol (PROVENTIL HFA;VENTOLIN HFA) 108 (90 BASE) MCG/ACT inhaler Inhale 2 puffs into the lungs every 4 (four) hours as needed for wheezing or shortness of breath. (Patient not taking: Reported on 03/19/2016) 1 Inhaler 0 Not Taking at Unknown time  . cetirizine (ZYRTEC) 10 MG chewable tablet Chew 1 tablet (10 mg total) by mouth daily. (Patient not taking: Reported on 03/19/2016) 20 tablet 1 Not Taking at Unknown time  . cloZAPine (CLOZARIL) 100 MG tablet Take 1 tablet (100 mg total) by mouth 2 (two) times daily. (Patient not taking: Reported on 03/19/2016) 60 tablet 0 Not Taking at Unknown time  . divalproex (DEPAKOTE) 250 MG DR tablet Take 1 tablet (250 mg total) by mouth at bedtime. (Patient not taking: Reported on 03/19/2016) 30 tablet 0 Not Taking at Unknown time  . divalproex (DEPAKOTE) 500 MG DR tablet Take 2 tablets (1,000 mg total) by mouth at  bedtime. (Patient not taking: Reported on 03/19/2016) 60 tablet 0 Not Taking at Unknown time  . insulin glargine (LANTUS) 100 UNIT/ML injection Inject 10 Units into the skin at bedtime.   Not Taking at Unknown time  . lisinopril (PRINIVIL,ZESTRIL) 10 MG tablet Take 10 mg by mouth daily.    Not Taking at Unknown time  . LORazepam (ATIVAN) 0.5 MG tablet Take 1 tablet (0.5 mg total) by mouth 2 (two) times daily. (Patient not taking: Reported on 03/19/2016) 60 tablet 0 Not Taking at Unknown time  . metoprolol tartrate (LOPRESSOR) 25 MG tablet Take 25 mg by mouth 2 (two) times daily.   Not Taking at Unknown time  . QUEtiapine (SEROQUEL) 100 MG tablet Take 1 tablet (100 mg total) by mouth at bedtime. (Patient not taking: Reported on 03/19/2016) 30 tablet 0 Not Taking at Unknown  time  . QUEtiapine (SEROQUEL) 50 MG tablet Take 1 tablet (50 mg total) by mouth daily. (Patient not taking: Reported on 03/19/2016) 30 tablet 0 Not Taking at Unknown time  . zolpidem (AMBIEN) 5 MG tablet Take 1 tablet (5 mg total) by mouth at bedtime as needed for sleep. (Patient not taking: Reported on 03/19/2016) 30 tablet 0 Not Taking at Unknown time    Treatment Modalities: Medication Management, Group therapy, Case management,  1 to 1 session with clinician, Psychoeducation, Recreational therapy.   Physician Treatment Plan for Primary Diagnosis: Paranoid schizophrenia ([place]) Long Term Goal(s): Improvement in symptoms so as ready for discharge  Short Term Goals: Ability to identify changes in lifestyle to reduce recurrence of condition will improve  Medication Management: Evaluate patient's response, side effects, and tolerance of medication regimen.  Therapeutic Interventions: 1 to 1 sessions, Unit Group sessions and Medication administration.  Evaluation of Outcomes: Not Met  Physician Treatment Plan for Secondary Diagnosis: Principal Problem:   Paranoid schizophrenia ([place])   Long Term Goal(s): Improvement in symptoms so as ready for discharge  Short Term Goals: Ability to identify and develop effective coping behaviors will improve  Medication Management: Evaluate patient's response, side effects, and tolerance of medication regimen.  Therapeutic Interventions: 1 to 1 sessions, Unit Group sessions and Medication administration.  Evaluation of Outcomes: Not Met  11/2: Patient today seen as disorganized, delusional - will change his clozaril to [place] - given his inability to be compliant with labs as well as taking his medications , and given the fact that he is going to return to his independent living situation with case management available . Will discontinue  Clozaril for inability to be compliant with labs. Will start a trial of Invega 3 mg po qhs for psychosis. Will  offer Invega sustenna IM prior to discharge. Pt agrees with plan. Will continue Depakote , change to ER 1000 mg po qhs for mood sx. Depakote level  - 03/25/16. Will discontinue Trazodone for lack of efficacy. Start Doxepin 10 mg po qhs for sleep.  11/7:  Will continue Invega  12 mg po qhs for psychosis. Will offer Invega sustenna IM prior to discharge. Pt agrees with plan. Increased  Depakote  ER to 1250 mg po qhs for mood lability , although Depakote level - 03/25/16- 72 ( therapeutic) ,  depakote level on 03/30/16- I have reviewed depakote level today - 74- therapeutic. Will increase  Doxepin to 50 mg po qhs for sleep. Will continue Ativan  1 mg po qhs for anxiety   11/13: Patient today seen as minimizing his sx, continues to be grandiose , delusional , has  sleep issues . Started on a second antipsychotic yesterday to augment the effect of Invega . Will reduce Invega to 9 mg po qhs for psychosis. Will continue Geodon 40 mg po bid with meals for augmenting the effect of Invega . Increased  Depakote  ER to 1250 mg po qhs for mood lability , although Depakote level - 03/25/16- 72 ( therapeutic) ,  depakote level on 03/30/16- I have reviewed depakote level today - 74- therapeutic. Will discontinue  Doxepin for lack of efficacy. Start Elavil 25 mg po qhs for sleep. Will continue Ativan  1 mg po qhs for anxiety    RN Treatment Plan for Primary Diagnosis: Paranoid schizophrenia ([place]) Long Term Goal(s): Knowledge of disease and therapeutic regimen to maintain health will improve  Short Term Goals: Compliance with prescribed medications will improve  Medication Management: RN will administer medications as ordered by provider, will assess and evaluate patient's response and provide education to patient for prescribed medication. RN will report any adverse and/or side effects to prescribing provider.  Therapeutic Interventions: 1 on 1 counseling sessions, Psychoeducation, Medication administration,  Evaluate responses to treatment, Monitor vital signs and CBGs as ordered, Perform/monitor CIWA, COWS, AIMS and Fall Risk screenings as ordered, Perform wound care treatments as ordered.  Evaluation of Outcomes: Progressing   LCSW Treatment Plan for Primary Diagnosis: Paranoid schizophrenia ([place]) Long Term Goal(s): Safe transition to appropriate next level of care at discharge, Engage patient in therapeutic group addressing interpersonal concerns.  Short Term Goals: Engage patient in aftercare planning with referrals and resources  Therapeutic Interventions: Assess for all discharge needs, 1 to 1 time with Social worker, Explore available resources and support systems, Assess for adequacy in community support network, Educate family and significant other(s) on suicide prevention, Complete Psychosocial Assessment, Interpersonal group therapy.  Evaluation of Outcomes: Progressing  CSW left message for Melody Jefferey Pica transition coordinator at [phone]. 11/2:  [facility] does not support referral to [facility].  However, they are requesting referral to [facility] as a woman with whom Elior had a good relationship with in the past has transferred to that agency.  As noted above, we are trying to switch him to injectable due to recent history of non-compliance with meds, and the fact that he will not be monitored daily. 11/7:  [facility] came to see patient.  Unable to complete assessment due to delusions, disorganization 11?13: Pt visited by [facility] peer support last week-they report he is far from baseline previous to first of three hospitalizations that began in October, and that he appears incapable of living independently.  [facility] has agreed that if he does not progress under our care, they will support GH/[facility] referral   Progress in Treatment: Attending groups: No Participating in groups: No Taking medication as prescribed: Yes Toleration medication: Yes, no side effects reported at  this time Family/Significant other contact made: Yes  See above Patient understands diagnosis: No  Limited insight Discussing patient identified problems/goals with staff: Yes Medical problems stabilized or resolved: Yes Denies suicidal/homicidal ideation: Yes Issues/concerns per patient self-inventory: None Other: N/A  New problem(s) identified: None identified at this time.   New Short Term/Long Term Goal(s): None identified at this time.   Discharge Plan or Barriers: See above  Reason for Continuation of Hospitalization: Disorganization Medication stabilization Delusions   Estimated Length of Stay: 3-5 days  Attendees: Patient: 04/05/2016  10:13 AM  Physician: Ursula Alert, MD 04/05/2016  10:13 AM  Nursing: Vanice Sarah 04/05/2016  10:13 AM  RN Care Manager: Lars Pinks, RN 04/05/2016  10:13 AM  Social Worker: Ripley Fraise 04/05/2016  10:13 AM  Recreational Therapist: Marjette  04/05/2016  10:13 AM  Other: Norberto Sorenson 04/05/2016  10:13 AM  Other:  04/05/2016  10:13 AM    Scribe for Treatment Team:  Roque Lias LCSW 04/05/2016 10:13 AM

## 2016-04-05 NOTE — Progress Notes (Signed)
[facility] MD Progress Note  04/05/2016 1:06 PM Ronald Roman  MRN:  [id number] Subjective: Patient reports " I have babies , I can see them clearly , I want to deliver them , Can you send me to [facility].'   Objective: IVANN TRIMARCO is a 55 year old AA male with history ofparanoid schizophrenia, DM, HTN,who was found walking naked by neighbors, recommended by his case manager to be brought to the hospital for evaluation.  Patient seen and chart reviewed.Discussed patient with treatment team.  Pt today seen as delusional and disorganized , focussed on having babies in his tummy and wanting to deliver them. Pt has decompensated since Clinical research associate evaluated him on Friday. Pt used to be on clozaril , his medication was discontinued after discussion with his community suppport services - since he was noncompliant and they felt like he could not be referred to a GH right now. However given his severe decompensation and his lack of progress on two current antipsychotics - geodon and invega - will restart Clozaril and refer patient to [facility] for higher level of care.         Principal Problem: Paranoid schizophrenia (HCC) Diagnosis:   Patient Active Problem List   Diagnosis Date Noted  . Acute psychosis [F23]   . Leukocytosis [D72.829] 02/06/2015  . Sleep apnea [G47.30] 02/05/2015  . AKI (acute kidney injury) (HCC) [N17.9] 02/04/2015  . Seizure disorder (HCC) [G40.909] 02/04/2015  . Acute encephalopathy [G93.40] 02/04/2015  . Diabetes mellitus without complication (HCC) [E11.9]   . Hyperlipidemia [E78.5]   . Hypertension [I10]   . Paranoid schizophrenia (HCC) [F20.0]    Total Time spent with patient: 25 minutes  Past Psychiatric History: Please see H&P.   Past Medical History:  Past Medical History:  Diagnosis Date  . Diabetes mellitus without complication (HCC)   . GERD (gastroesophageal reflux disease)   . Hyperlipidemia   . Hypertension   . Paranoid schizophrenia (HCC)   . Sleep apnea    . Uric acid nephrolithiasis    History reviewed. No pertinent surgical history. Family History: Patient denies Family Psychiatric  History: Pt denies Social History: Patient reports [place] , is single , has children who does not live with him , reports he gets food stamps . History  Alcohol Use No     History  Drug Use No    Social History   Social History  . Marital status: Single    Spouse name: N/A  . Number of children: N/A  . Years of education: N/A   Social History Main Topics  . Smoking status: Current Every Day Smoker    Packs/day: 1.00    Types: Cigarettes  . Smokeless tobacco: Never Used  . Alcohol use No  . Drug use: No  . Sexual activity: Not Currently   Other Topics Concern  . None   Social History Narrative  . None   Additional Social History:                         Sleep: Poor  Appetite:  Fair  Current Medications: Current Facility-Administered Medications  Medication Dose Route Frequency Provider Last Rate Last Dose  . acetaminophen (TYLENOL) tablet 650 mg  650 mg Oral Q6H PRN Kristeen Mans, NP   650 mg at 03/21/16 0329  . amantadine (SYMMETREL) capsule 100 mg  100 mg Oral BID Laveda Abbe, NP   100 mg at 04/05/16 0827  .  cloZAPine (CLOZARIL) tablet 25 mg  25 mg Oral QHS Gianfranco Araki, MD      . divalproex (DEPAKOTE ER) 24 hr tablet 1,250 mg  1,250 mg Oral QHS Jomarie LongsSaramma Syrus Nakama, MD   1,250 mg at 04/04/16 2114  . insulin aspart (novoLOG) injection 0-15 Units  0-15 Units Subcutaneous TID WC Jomarie LongsSaramma Fawzi Melman, MD   0 Units at 04/05/16 1213  . insulin aspart (novoLOG) injection 0-5 Units  0-5 Units Subcutaneous QHS Jomarie LongsSaramma Landyn Lorincz, MD   5 Units at 04/01/16 2158  . insulin aspart (novoLOG) injection 4 Units  4 Units Subcutaneous TID WC Jomarie LongsSaramma Malinda Mayden, MD   4 Units at 04/05/16 1214  . insulin glargine (LANTUS) injection 20 Units  20 Units Subcutaneous QHS Jomarie LongsSaramma Ahmar Pickrell, MD   20 Units at 04/05/16 0006  . lisinopril  (PRINIVIL,ZESTRIL) tablet 10 mg  10 mg Oral Daily Kristeen MansFran E Hobson, NP   10 mg at 04/05/16 0827  . magnesium hydroxide (MILK OF MAGNESIA) suspension 30 mL  30 mL Oral Daily Kerry HoughSpencer E Simon, PA-C   30 mL at 04/05/16 0827  . nicotine polacrilex (NICORETTE) gum 2 mg  2 mg Oral PRN Jomarie LongsSaramma Jakobie Henslee, MD      . OLANZapine (ZYPREXA) tablet 10 mg  10 mg Oral BID PRN Jomarie LongsSaramma Faustina Gebert, MD       Or  . OLANZapine (ZYPREXA) injection 10 mg  10 mg Intramuscular BID PRN Jomarie LongsSaramma Mena Lienau, MD      . temazepam (RESTORIL) capsule 15 mg  15 mg Oral QHS Elek Holderness, MD      . ziprasidone (GEODON) capsule 40 mg  40 mg Oral BID WC Jomarie LongsSaramma Jazsmin Couse, MD        Lab Results:  Results for orders placed or performed during the hospital encounter of 03/19/16 (from the past 48 hour(s))  Glucose, capillary     Status: Abnormal   Collection Time: 04/03/16  4:59 PM  Result Value Ref Range   Glucose-Capillary 200 (H) 65 - 99 mg/dL   Comment 1 Notify RN    Comment 2 Document in Chart   Glucose, capillary     Status: Abnormal   Collection Time: 04/03/16  8:41 PM  Result Value Ref Range   Glucose-Capillary 189 (H) 65 - 99 mg/dL  Glucose, capillary     Status: Abnormal   Collection Time: 04/04/16  6:04 AM  Result Value Ref Range   Glucose-Capillary 152 (H) 65 - 99 mg/dL  Glucose, capillary     Status: None   Collection Time: 04/04/16 11:41 AM  Result Value Ref Range   Glucose-Capillary 92 65 - 99 mg/dL  Glucose, capillary     Status: Abnormal   Collection Time: 04/04/16  4:55 PM  Result Value Ref Range   Glucose-Capillary 212 (H) 65 - 99 mg/dL  Glucose, capillary     Status: Abnormal   Collection Time: 04/04/16  8:48 PM  Result Value Ref Range   Glucose-Capillary 183 (H) 65 - 99 mg/dL  Glucose, capillary     Status: None   Collection Time: 04/05/16  5:58 AM  Result Value Ref Range   Glucose-Capillary 92 65 - 99 mg/dL   Comment 1 Notify RN   Glucose, capillary     Status: Abnormal   Collection Time: 04/05/16 12:11 PM   Result Value Ref Range   Glucose-Capillary 111 (H) 65 - 99 mg/dL    Blood Alcohol level:  Lab Results  Component Value Date   ETH <5 03/18/2016   ETH <5  03/01/2016    Metabolic Disorder Labs: Lab Results  Component Value Date   HGBA1C 8.0 (H) 03/23/2016   MPG 183 03/23/2016   MPG 183 03/20/2016   Lab Results  Component Value Date   PROLACTIN 31.5 (H) 03/23/2016   PROLACTIN 36.6 (H) 03/20/2016   Lab Results  Component Value Date   CHOL 145 03/23/2016   TRIG 144 03/23/2016   HDL 30 (L) 03/23/2016   CHOLHDL 4.8 03/23/2016   VLDL 29 03/23/2016   LDLCALC 86 03/23/2016   LDLCALC 21 03/20/2016    Physical Findings: AIMS: Facial and Oral Movements Muscles of Facial Expression: None, normal Lips and Perioral Area: None, normal Jaw: None, normal Tongue: None, normal,Extremity Movements Upper (arms, wrists, hands, fingers): None, normal Lower (legs, knees, ankles, toes): None, normal, Trunk Movements Neck, shoulders, hips: None, normal, Overall Severity Severity of abnormal movements (highest score from questions above): None, normal Incapacitation due to abnormal movements: None, normal Patient's awareness of abnormal movements (rate only patient's report): No Awareness, Dental Status Current problems with teeth and/or dentures?: No Does patient usually wear dentures?: No  CIWA:  CIWA-Ar Total: 1 COWS:  COWS Total Score: 3  Musculoskeletal: Strength & Muscle Tone: within normal limits Gait & Station: normal Patient leans: N/A   Psychiatric Specialty Exam: Physical Exam  Nursing note and vitals reviewed.   Review of Systems  Psychiatric/Behavioral: Positive for depression. The patient is nervous/anxious and has insomnia.   All other systems reviewed and are negative.   Blood pressure 119/70, pulse (!) 106, temperature 99.1 F (37.3 C), temperature source Oral, resp. rate 16, height 5' 5.5" (1.664 m), weight 91.6 kg (202 lb), SpO2 100 %.Body mass index is 33.1  kg/m.  General Appearance: Fairly Groomed  Eye Contact:  Fair  Speech:  Pressured   Volume:  Normal  Mood:  Anxious and Irritable  Affect:  Labile   Thought Process:  Disorganized, Irrelevant and Descriptions of Associations: Tangential   Orientation:  Full (Time, Place, and Person)  Thought Content:  Delusions, Obsessions, Paranoid Ideation, Rumination and Tangential   Suicidal Thoughts:  No but is delusional and disorganized on and off   Homicidal Thoughts:  No  Memory:  Immediate;   Poor Recent;   Poor Remote;   Poor  Judgement:  Impaired  Insight:  Shallow  Psychomotor Activity:  Restlessness  Concentration:  Concentration: Poor and Attention Span: Poor  Recall:  Fair  Fund of Knowledge:  Fair  Language:  Fair  Akathisia:  No  Handed:  Right  AIMS (if indicated):     Assets:  Desire for Improvement Resilience  ADL's:  Intact  Cognition:  WNL  Sleep:  Number of Hours: 4.75   03/31/16 CSW was able to obtain collateral information from his community support - who reported patient's baseline a few months ago was much better , he does have chronic delusions , but he was abel to take care of self and participate better in decision making discussions .        Treatment Plan Summary: Osvaldo AngstBarry D Sen is a 55 year old AA male with history ofparanoid schizophrenia, DM, HTN,who was found walking naked by neighbors, recommended by his case manager to be brought to the hospital for evaluation.  Patient today seen with severe delusions , is disorganized and anxious . Pt decompensated fruther on Invega and geodon , hence will restart Clozaril and [facility] care.  Paranoid schizophrenia (HCC) currently unstable.  Will continue today 11/13 /17 plan as below except where it is noted.  Daily contact with patient to assess and evaluate symptoms and progress in treatment and Medication management    Will restart Clozaril 25 mg po qhs for  psychosis.Will order CBC with diff qpm. Reviewed CBC with diff - 03/25/16- ANC/WBC- wnl. Will taper off Inevag as well as Geodon for lack of efficacy. Will continue  Depakote  ER to 1250 mg po qhs for mood lability , although  Depakote level  - 03/25/16- 72 ( therapeutic) ,  depakote level on 03/30/16- I have reviewed depakote level today - 74- therapeutic. Will discontinue Elavil - start Restoril 15 mg po qhs for sleep. Will discontinue Ativan. Will continue to monitor vitals ,medication compliance and treatment side effects while patient is here.  Reviewed labs: EKG - qtc - wnl, TSH- wnl , hba1c- 8- dietician consult once patient is more stable  ,  pl -31.5 ,  lipid panel HDL - low - diet control recommendations to be provided when patient is more stable. CSW will continue  working on disposition. Patient to be referred to [facility] for further level of care. Patient to participate in therapeutic milieu Lielle Vandervort, MD 04/05/2016, 1:06 PM

## 2016-04-05 NOTE — Progress Notes (Signed)
Writer has observed patient pacing in and out of the dayroom periodically and mostly isolative to his room. He continues to request to leave reporting that the doctor has told him he can leave. Writer informed him that he could not leave during the night and he has not been discharged by his doctor. Writer encouraged him to stay with us tonight and talk with his doctor on tomorrow about when he is discharging. He eventually did not bring up the subject again. He is pleasant but delusional in his thought process. Safety maintained on unit with 15 min checks.

## 2016-04-05 NOTE — [facility] Group Notes (Signed)
[facility] LCSW Group Therapy  04/05/2016 1:15 pm  Type of Therapy: Process Group Therapy  Participation Level:  Active  Participation Quality:  Appropriate  Affect:  Flat  Cognitive:  Oriented  Insight:  Improving  Engagement in Group:  Limited  Engagement in Therapy:  Limited  Modes of Intervention:  Activity, Clarification, Education, Problem-solving and Support  Summary of Progress/Problems: Today's group addressed the issue of overcoming obstacles.  Patients were asked to identify their biggest obstacle post d/c that stands in the way of their on-going success, and then problem solve as to how to manage this.Invited.  Chose to not attend.  Daryel Geraldorth, Kaelin Holford B 04/05/2016   3:41 PM

## 2016-04-05 NOTE — Progress Notes (Signed)
Did not attend group 

## 2016-04-05 NOTE — Progress Notes (Signed)
Recreation Therapy Notes  Date: 04/05/16 Time: 1000 Location: 500 Hall Dayroom  Group Topic: Self-Esteem  Goal Area(s) Addresses:  Patient will identify positive ways to increase self-esteem. Patient will verbalize benefit of increased self-esteem.  Intervention: Markers, construction paper  Activity: Personalized License Plate.  Patients were to get a piece of construction paper and create a license plate that highlights events, people or things that are important to them.  In doing so, patients should be able to identify things that make them unique.  Education:  Self-Esteem, Building control surveyorDischarge Planning.   Education Outcome: Acknowledges education/In group clarification offered/Needs additional education  Clinical Observations/Feedback: Pt did not attend group.   Caroll RancherMarjette Loudon Krakow, LRT/CTRS         Caroll RancherLindsay, Daemion Mcniel A 04/05/2016 11:44 AM

## 2016-04-06 LAB — GLUCOSE, CAPILLARY
GLUCOSE-CAPILLARY: 141 mg/dL — AB (ref 65–99)
Glucose-Capillary: 149 mg/dL — ABNORMAL HIGH (ref 65–99)
Glucose-Capillary: 115 mg/dL — ABNORMAL HIGH (ref 65–99)
Glucose-Capillary: 158 mg/dL — ABNORMAL HIGH (ref 65–99)

## 2016-04-06 MED ORDER — ZIPRASIDONE HCL 20 MG PO CAPS
20.0000 mg | ORAL_CAPSULE | Freq: Every day | ORAL | Status: AC
  Administered 2016-04-06: 20 mg via ORAL
  Filled 2016-04-06: qty 1

## 2016-04-06 MED ORDER — CLOZAPINE 25 MG PO TABS
12.5000 mg | ORAL_TABLET | Freq: Every day | ORAL | Status: DC
  Administered 2016-04-06 – 2016-04-27 (×22): 12.5 mg via ORAL
  Filled 2016-04-06 (×25): qty 1

## 2016-04-06 NOTE — Progress Notes (Signed)
D: Pt continues to be very confused, paranoid and delusional; Pt continues to talk to self and laugh inappropriately. Pt denies any form of depression, anxiety, pain, SI, HI and AVH. Pt is very restless; Pt was observed pacing the hall. A: Medications offered as prescribed.  Support, encouragement, and safe environment provided.  15-minute safety checks continue. R: Pt was med compliant.  Pt attended wrap-up group. Safety checks continue.

## 2016-04-06 NOTE — Progress Notes (Signed)
[facility]  04/06/2016 2:41 PM Ronald AngstBarry D Roman  MRN:  [id number] Subjective: Patient reports " I have all these babies."     Objective: Ronald Roman is a 55 year old AA male with history ofparanoid schizophrenia, DM, HTN,who was found walking naked by neighbors, recommended by his case manager to be brought to the hospital for evaluation.  Patient seen and chart reviewed.Discussed patient with treatment team.  Patient today seen as disorganized , has inappropriate laughter , is labile. Pt continues to struggle with sleep , although he got approximately 4 hrs last night , which is better for him. Pt restarted on clozaril - will continue titrate higher.        Principal Problem: Paranoid schizophrenia (HCC) Diagnosis:   Patient Active Problem List   Diagnosis Date Noted  . Acute psychosis [F23]   . Leukocytosis [D72.829] 02/06/2015  . Sleep apnea [G47.30] 02/05/2015  . AKI (acute kidney injury) (HCC) [N17.9] 02/04/2015  . Seizure disorder (HCC) [G40.909] 02/04/2015  . Acute encephalopathy [G93.40] 02/04/2015  . Diabetes mellitus without complication (HCC) [E11.9]   . Hyperlipidemia [E78.5]   . Hypertension [I10]   . Paranoid schizophrenia (HCC) [F20.0]    Total Time spent with patient: 25 minutes  Past Psychiatric History: Please see H&P.   Past Medical History:  Past Medical History:  Diagnosis Date  . Diabetes mellitus without complication (HCC)   . GERD (gastroesophageal reflux disease)   . Hyperlipidemia   . Hypertension   . Paranoid schizophrenia (HCC)   . Sleep apnea   . Uric acid nephrolithiasis    History reviewed. No pertinent surgical history. Family History: Patient denies Family Psychiatric  History: Pt denies Social History: Patient reports he [place] in [place] , is single , has children who does not live with him , reports he gets food stamps . History  Alcohol Use No     History  Drug Use No    Social History   Social  History  . Marital status: Single    Spouse name: N/A  . Number of children: N/A  . Years of education: N/A   Social History Main Topics  . Smoking status: Current Every Day Smoker    Packs/day: 1.00    Types: Cigarettes  . Smokeless tobacco: Never Used  . Alcohol use No  . Drug use: No  . Sexual activity: Not Currently   Other Topics Concern  . None   Social History Narrative  . None   Additional Social History:                         Sleep: Poor  Appetite:  Fair  Current Medications: Current Facility-Administered Medications  Medication Dose Route Frequency Provider Last Rate Last Dose  . acetaminophen (TYLENOL) tablet 650 mg  650 mg Oral Q6H PRN Kristeen MansFran E Hobson, NP   650 mg at 03/21/16 0329  . amantadine (SYMMETREL) capsule 100 mg  100 mg Oral BID Laveda AbbeLaurie Britton Parks, NP   100 mg at 04/06/16 0750  . cloZAPine (CLOZARIL) tablet 12.5 mg  12.5 mg Oral Daily Stasha Naraine, MD   12.5 mg at 04/06/16 1020  . cloZAPine (CLOZARIL) tablet 25 mg  25 mg Oral QHS Jomarie LongsSaramma Makaylyn Sinyard, MD   25 mg at 04/05/16 2113  . divalproex (DEPAKOTE ER) 24 hr tablet 1,250 mg  1,250 mg Oral QHS Jomarie LongsSaramma Peretz Thieme, MD   1,250 mg at 04/05/16 2113  . insulin aspart (  novoLOG) injection 0-15 Units  0-15 Units Subcutaneous TID WC Jomarie Longs, MD   2 Units at 04/06/16 0655  . insulin aspart (novoLOG) injection 0-5 Units  0-5 Units Subcutaneous QHS Jomarie Longs, MD   5 Units at 04/01/16 2158  . insulin aspart (novoLOG) injection 4 Units  4 Units Subcutaneous TID WC Jomarie Longs, MD   4 Units at 04/06/16 1214  . insulin glargine (LANTUS) injection 20 Units  20 Units Subcutaneous QHS Jomarie Longs, MD   20 Units at 04/05/16 2255  . lisinopril (PRINIVIL,ZESTRIL) tablet 10 mg  10 mg Oral Daily Kristeen Mans, NP   10 mg at 04/06/16 0750  . magnesium hydroxide (MILK OF MAGNESIA) suspension 30 mL  30 mL Oral Daily Kerry Hough, PA-C   30 mL at 04/06/16 0751  . nicotine polacrilex (NICORETTE) gum 2 mg   2 mg Oral PRN Jomarie Longs, MD      . OLANZapine (ZYPREXA) tablet 10 mg  10 mg Oral BID PRN Jomarie Longs, MD       Or  . OLANZapine (ZYPREXA) injection 10 mg  10 mg Intramuscular BID PRN Jomarie Longs, MD      . temazepam (RESTORIL) capsule 15 mg  15 mg Oral QHS Jomarie Longs, MD   15 mg at 04/05/16 2113  . ziprasidone (GEODON) capsule 20 mg  20 mg Oral Q supper Jomarie Longs, MD        Lab Results:  Results for orders placed or performed during the hospital encounter of 03/19/16 (from the past 48 hour(s))  Glucose, capillary     Status: Abnormal   Collection Time: 04/04/16  4:55 PM  Result Value Ref Range   Glucose-Capillary 212 (H) 65 - 99 mg/dL  Glucose, capillary     Status: Abnormal   Collection Time: 04/04/16  8:48 PM  Result Value Ref Range   Glucose-Capillary 183 (H) 65 - 99 mg/dL  Glucose, capillary     Status: None   Collection Time: 04/05/16  5:58 AM  Result Value Ref Range   Glucose-Capillary 92 65 - 99 mg/dL   Comment 1 Notify RN   Glucose, capillary     Status: Abnormal   Collection Time: 04/05/16 12:11 PM  Result Value Ref Range   Glucose-Capillary 111 (H) 65 - 99 mg/dL  Glucose, capillary     Status: Abnormal   Collection Time: 04/05/16  5:18 PM  Result Value Ref Range   Glucose-Capillary 143 (H) 65 - 99 mg/dL  CBC with Differential/Platelet     Status: Abnormal   Collection Time: 04/05/16  6:55 PM  Result Value Ref Range   WBC 12.8 (H) 4.0 - 10.5 K/uL   RBC 4.27 4.22 - 5.81 MIL/uL   Hemoglobin 11.3 (L) 13.0 - 17.0 g/dL   HCT 16.1 (L) 09.6 - 04.5 %   MCV 79.6 78.0 - 100.0 fL   MCH 26.5 26.0 - 34.0 pg   MCHC 33.2 30.0 - 36.0 g/dL   RDW 40.9 81.1 - 91.4 %   Platelets 344 150 - 400 K/uL   Neutrophils Relative % 67 %   Lymphocytes Relative 23 %   Monocytes Relative 9 %   Eosinophils Relative 1 %   Basophils Relative 0 %   Neutro Abs 8.6 (H) 1.7 - 7.7 K/uL   Lymphs Abs 2.9 0.7 - 4.0 K/uL   Monocytes Absolute 1.2 (H) 0.1 - 1.0 K/uL   Eosinophils  Absolute 0.1 0.0 - 0.7 K/uL   Basophils Absolute  0.0 0.0 - 0.1 K/uL   Smear Review LARGE PLATELETS PRESENT     Comment: Performed at [facility]  Glucose, capillary     Status: Abnormal   Collection Time: 04/05/16  8:30 PM  Result Value Ref Range   Glucose-Capillary 143 (H) 65 - 99 mg/dL  Glucose, capillary     Status: Abnormal   Collection Time: 04/06/16  6:09 AM  Result Value Ref Range   Glucose-Capillary 149 (H) 65 - 99 mg/dL  Glucose, capillary     Status: Abnormal   Collection Time: 04/06/16 12:06 PM  Result Value Ref Range   Glucose-Capillary 115 (H) 65 - 99 mg/dL   Comment 1 Notify RN    Comment 2 Document in Chart     Blood Alcohol level:  Lab Results  Component Value Date   ETH <5 03/18/2016   ETH <5 03/01/2016    Metabolic Disorder Labs: Lab Results  Component Value Date   HGBA1C 8.0 (H) 03/23/2016   MPG 183 03/23/2016   MPG 183 03/20/2016   Lab Results  Component Value Date   PROLACTIN 31.5 (H) 03/23/2016   PROLACTIN 36.6 (H) 03/20/2016   Lab Results  Component Value Date   CHOL 145 03/23/2016   TRIG 144 03/23/2016   HDL 30 (L) 03/23/2016   CHOLHDL 4.8 03/23/2016   VLDL 29 03/23/2016   LDLCALC 86 03/23/2016   LDLCALC 21 03/20/2016    Physical Findings: AIMS: Facial and Oral Movements Muscles of Facial Expression: None, normal Lips and Perioral Area: None, normal Jaw: None, normal Tongue: None, normal,Extremity Movements Upper (arms, wrists, hands, fingers): None, normal Lower (legs, knees, ankles, toes): None, normal, Trunk Movements Neck, shoulders, hips: None, normal, Overall Severity Severity of abnormal movements (highest score from questions above): None, normal Incapacitation due to abnormal movements: None, normal Patient's awareness of abnormal movements (rate only patient's report): No Awareness, Dental Status Current problems with teeth and/or dentures?: No Does patient usually wear dentures?: No  CIWA:  CIWA-Ar  Total: 1 COWS:  COWS Total Score: 3  Musculoskeletal: Strength & Muscle Tone: within normal limits Gait & Station: normal Patient leans: N/A   Psychiatric Specialty Exam: Physical Exam  Nursing note and vitals reviewed.   Review of Systems  Psychiatric/Behavioral: Positive for depression and hallucinations. The patient is nervous/anxious and has insomnia.   All other systems reviewed and are negative.   Blood pressure 100/71, pulse 97, temperature 98.7 F (37.1 C), temperature source Oral, resp. rate 18, height 5' 5.5" (1.664 m), weight 91.6 kg (202 lb), SpO2 100 %.Body mass index is 33.1 kg/m.  General Appearance: Fairly Groomed  Eye Contact:  Fair  Speech:  Pressured   Volume:  Normal  Mood:  Anxious, Dysphoric and Irritable  Affect:  Labile   Thought Process:  Disorganized, Irrelevant and Descriptions of Associations: Tangential   Orientation:  Full (Time, Place, and Person)  Thought Content:  Delusions, Obsessions, Paranoid Ideation, Rumination and Tangential   Suicidal Thoughts:  No but is delusional and disorganized  Homicidal Thoughts:  No  Memory:  Immediate;   Poor Recent;   Poor Remote;   Poor  Judgement:  Impaired  Insight:  Shallow  Psychomotor Activity:  Restlessness  Concentration:  Concentration: Poor and Attention Span: Poor  Recall:  [employer] of Knowledge:  Fair  Language:  Fair  Akathisia:  No  Handed:  Right  AIMS (if indicated):     Assets:  Desire for Improvement Resilience  ADL's:  Intact  Cognition:  WNL  Sleep:  Number of Hours: 4.75   03/31/16 CSW was able to obtain collateral information from his community support - who reported patient's baseline a few months ago was much better , he does have chronic delusions , but he was abel to take care of self and participate better in decision making discussions .        Treatment Plan Summary: SHINICHI ANGUIANO is a 55 year old AA male with history ofparanoid schizophrenia, DM, HTN,who was  found walking naked by neighbors, recommended by his case manager to be brought to the hospital for evaluation.  Patient today seen as disorganized. Pt decompensated fruther on Invega and geodon , restarted on Clozaril and awaiting [facility] placement.    Paranoid schizophrenia (HCC) currently unstable.  Will continue today 11/14 /17 plan as below except where it is noted.  Daily contact with patient to assess and evaluate symptoms and progress in treatment and Medication management    Will increase  Clozaril to 12.5 mg po daily and clozaril 25 mg po qhs for psychosis. Reviewed CBC with diff - 04/06/16- ANC- 8.6 /WBC- 12.8  wnl.needs weekly CBC/ANC. Will taper off  Geodon for lack of efficacy. Will continue  Depakote  ER to 1250 mg po qhs for mood lability , although  Depakote level  - 03/25/16- 72 ( therapeutic) ,  depakote level on 03/30/16- I have reviewed depakote level today - 74- therapeutic. Will continue Restoril 15 mg po qhs for sleep. Will continue to monitor vitals ,medication compliance and treatment side effects while patient is here.  Reviewed labs: EKG - qtc - wnl, TSH- wnl , hba1c- 8- dietician consult once patient is more stable  ,  pl -31.5 ,  lipid panel HDL - low - diet control recommendations to be provided when patient is more stable. CSW will continue  working on disposition. Patient  referred to [facility] for further level of care. Patient to participate in therapeutic milieu Olga Seyler, MD 04/06/2016, 2:41 PM

## 2016-04-06 NOTE — Progress Notes (Signed)
D: Pt visible in milieu intervals during shift. A & O to self and place. Denies SI, HI and pain. Pt continues to be disorganized and preoccupied. Observed actively hallucinating while pacing on unit with loud inappropriate laughter, told writer "these women are mad with us, because I'm leaving with her" pointing to someone by his side who was not actually there. Pt believes he's been d/c today, gets his belongings and stands by the door; gets easily irritated when redirected. However, he's verbally redirectable thus far.  A: Scheduled medications administered as prescribed with verbal education. Emotional support and availability provided. Encouraged pt to voice concerns, attend groups and comply with treatment regimen. Q 15 minutes checks maintained on and off unit for safety.  R: Pt remains medications compliant, denies adverse adverse drug reactions "no, I'm fine, I just think it's time for me to go home now". Tolerates all PO intake well. Continues to require verbal redirection which has been effective. POC maintained for safety.

## 2016-04-06 NOTE — Progress Notes (Signed)
MEDICATION RELATED CONSULT NOTE - FOLLOW UP   Pharmacy Consult for clozapine   Allergies  Allergen Reactions  . Cogentin [Benztropine] Other (See Comments)    Per MAR   . Penicillins Other (See Comments)    Per MAR - unable to verify patients PCN reaction   . Prolixin [Fluphenazine] Other (See Comments)    [facility]     Patient Measurements: Height: 5' 5.5" (166.4 cm) Weight: 202 lb (91.6 kg) IBW/kg (Calculated) : 62.65  Labs: ANC 04/05/16: 8.6  Recent Labs  04/05/16 1855  WBC 12.8*  HGB 11.3*  HCT 34.0*  PLT 344   Estimated Creatinine Clearance: 85.2 mL/min (by C-G formula based on SCr of 1.03 mg/dL).   Microbiology: No results found for this or any previous visit (from the past 720 hour(s)).  Medications:  Scheduled:  . amantadine  100 mg Oral BID  . cloZAPine  12.5 mg Oral Daily  . cloZAPine  25 mg Oral QHS  . divalproex  1,250 mg Oral QHS  . insulin aspart  0-15 Units Subcutaneous TID WC  . insulin aspart  0-5 Units Subcutaneous QHS  . insulin aspart  4 Units Subcutaneous TID WC  . insulin glargine  20 Units Subcutaneous QHS  . lisinopril  10 mg Oral Daily  . magnesium hydroxide  30 mL Oral Daily  . temazepam  15 mg Oral QHS  . ziprasidone  20 mg Oral Q supper    Plan/Assessment: Patient is eligible to receive Clozaril. ANC recorded on registry. Pt requires weekly CBCs.    Sherrlyn Hockachel Annalisa Colonna 04/06/2016,1:10 PM

## 2016-04-06 NOTE — Progress Notes (Signed)
Recreation Therapy Notes  Date: 04/06/16 Time: 1000 Location: 500 Hall Dayroom  Group Topic: Wellness  Goal Area(s) Addresses:  Patient will define components of whole wellness. Patient will verbalize benefit of whole wellness.  Intervention:  2 Decks of cards     Activity: Deck of Chance.  LRT dealt each player a card from one deck of cards.  LRT would select a card from the second deck to determine which individual will have the opportunity to do an exercise.  The individual can be determined by just the number on the card, by the suit (club, spade, diamond, heart or face card), odd numbers, even numbers or color.  The individual who has the card matching the LRT's card, will have to do an exercise determined by the LRT.  Education:Wellness, Discharge Planning.   Education Outcome: Acknowledges education/In group clarification offered/Needs additional education.   Clinical Observations/Feedback:  Pt did not attend group.   Caroll RancherMarjette Nidia Grogan, LRT/CTRS     Caroll RancherLindsay, Trevaughn Schear A 04/06/2016 12:10 PM

## 2016-04-06 NOTE — Progress Notes (Signed)
Adult Psychoeducational Group Note  Date:  04/06/2016 Time:  9:32 PM  Group Topic/Focus:  Wrap-Up Group:   The focus of this group is to help patients review their daily goal of treatment and discuss progress on daily workbooks.   Participation Level:  Active  Participation Quality:  Appropriate  Affect:  Appropriate  Cognitive:  Appropriate  Insight: Appropriate  Engagement in Group:  Engaged  Modes of Intervention:  Discussion  Additional Comments: The patient expressed that he attended group.The patient also said that he rates today a 8. Octavio Mannshigpen, Teven Mittman Lee 04/06/2016, 9:32 PM

## 2016-04-06 NOTE — [facility] Group Notes (Signed)
[facility] LCSW Group Therapy  04/06/2016 , 1:16 PM   Type of Therapy:  Group Therapy  Participation Level:  Active  Participation Quality:  Attentive  Affect:  Appropriate  Cognitive:  Alert  Insight:  Improving  Engagement in Therapy:  Engaged  Modes of Intervention:  Discussion, Exploration and Socialization  Summary of Progress/Problems: Today's group focused on the term Diagnosis.  Participants were asked to define the term, and then pronounce whether it is a negative, positive or neutral term. Invited.  Chose to not attend.  "I'm leaving today"  Ronald Roman, Ronald Roman 04/06/2016 , 1:16 PM

## 2016-04-07 LAB — GLUCOSE, CAPILLARY
GLUCOSE-CAPILLARY: 104 mg/dL — AB (ref 65–99)
GLUCOSE-CAPILLARY: 148 mg/dL — AB (ref 65–99)
Glucose-Capillary: 160 mg/dL — ABNORMAL HIGH (ref 65–99)
Glucose-Capillary: 94 mg/dL (ref 65–99)

## 2016-04-07 MED ORDER — CLOZAPINE 25 MG PO TABS
50.0000 mg | ORAL_TABLET | Freq: Every day | ORAL | Status: DC
  Administered 2016-04-07 – 2016-04-08 (×2): 50 mg via ORAL
  Filled 2016-04-07 (×3): qty 2

## 2016-04-07 NOTE — Progress Notes (Signed)
D: Pt is confused, paranoid and delusional. Pt continues to talk to self and laugh inappropriately; states, "can't you see that I am talking to my children?" Pt denies any form of depression, anxiety, pain, SI, HI and AVH. Pt is very restless; Pt was observed pacing the hall. A: Medications offered as prescribed.  Support, encouragement, and safe environment provided.  15-minute safety checks continue. R: Pt was med compliant.  Pt attended wrap-up group. Safety checks continue.

## 2016-04-07 NOTE — [facility] Counselor (Signed)
Confirmed on [facility] waitlist.

## 2016-04-07 NOTE — [facility] Group Notes (Signed)
[facility] LCSW Group Therapy  04/07/2016 4:09 PM   Type of Therapy:  Group Therapy   Participation Level:  Engaged  Participation Quality:  Attentive  Affect:  Appropriate   Cognitive:  Alert   Insight:  Engaged  Engagement in Therapy:  Improving   Modes of Intervention:  Education, Exploration, Socialization   Summary of Progress/Problems: Invited, chose not to attend.   [name] from [employer] was here to tell his story of recovery, inform patients about [employer] and play his guitar.   Baldo DaubJolan Shelena Castelluccio 04/07/2016 4:09 PM

## 2016-04-07 NOTE — Progress Notes (Signed)
Pt did not attend wrap-up group   

## 2016-04-07 NOTE — Progress Notes (Signed)
[facility] MD Progress Note  04/07/2016 9:34 AM Ronald Roman  MRN:  [id number] Subjective: Patient reports " I could not sleep , my children would not let me sleep.'      Objective: Ronald Roman is a 55 year old AA male with history ofparanoid schizophrenia, DM, HTN,who was found walking naked by neighbors, recommended by his case manager to be brought to the hospital for evaluation.  Patient seen and chart reviewed.Discussed patient with treatment team.  Patient continues to be disorganized ,continues to have delusional thoughts , irrelevant on and off and continues to have  inappropriate laughter. Pt is currently on clozaril - tolerating it well, denies ADRs. Per staff pt continues to need redirection on the unit.         Principal Problem: Paranoid schizophrenia (HCC) Diagnosis:   Patient Active Problem List   Diagnosis Date Noted  . Acute psychosis [F23]   . Leukocytosis [D72.829] 02/06/2015  . Sleep apnea [G47.30] 02/05/2015  . AKI (acute kidney injury) (HCC) [N17.9] 02/04/2015  . Seizure disorder (HCC) [G40.909] 02/04/2015  . Acute encephalopathy [G93.40] 02/04/2015  . Diabetes mellitus without complication (HCC) [E11.9]   . Hyperlipidemia [E78.5]   . Hypertension [I10]   . Paranoid schizophrenia (HCC) [F20.0]    Total Time spent with patient: 25 minutes  Past Psychiatric History: Please see H&P.   Past Medical History:  Past Medical History:  Diagnosis Date  . Diabetes mellitus without complication (HCC)   . GERD (gastroesophageal reflux disease)   . Hyperlipidemia   . Hypertension   . Paranoid schizophrenia (HCC)   . Sleep apnea   . Uric acid nephrolithiasis    History reviewed. No pertinent surgical history. Family History: Patient denies Family Psychiatric  History: Pt denies Social History: Patient reports [place] , is single , has children who does not live with him , reports he gets food stamps . History  Alcohol Use No      History  Drug Use No    Social History   Social History  . Marital status: Single    Spouse name: N/A  . Number of children: N/A  . Years of education: N/A   Social History Main Topics  . Smoking status: Current Every Day Smoker    Packs/day: 1.00    Types: Cigarettes  . Smokeless tobacco: Never Used  . Alcohol use No  . Drug use: No  . Sexual activity: Not Currently   Other Topics Concern  . None   Social History Narrative  . None   Additional Social History:                         Sleep: restless  Appetite:  Fair  Current Medications: Current Facility-Administered Medications  Medication Dose Route Frequency Provider Last Rate Last Dose  . acetaminophen (TYLENOL) tablet 650 mg  650 mg Oral Q6H PRN Kristeen Mans, NP   650 mg at 03/21/16 0329  . amantadine (SYMMETREL) capsule 100 mg  100 mg Oral BID Laveda Abbe, NP   100 mg at 04/07/16 4098  . cloZAPine (CLOZARIL) tablet 12.5 mg  12.5 mg Oral Daily Jomarie Longs, MD   12.5 mg at 04/07/16 0809  . cloZAPine (CLOZARIL) tablet 25 mg  25 mg Oral QHS Jomarie Longs, MD   25 mg at 04/06/16 2132  . divalproex (DEPAKOTE ER) 24 hr tablet 1,250 mg  1,250 mg Oral QHS Jomarie Longs, MD  1,250 mg at 04/06/16 2132  . insulin aspart (novoLOG) injection 0-15 Units  0-15 Units Subcutaneous TID WC Jomarie LongsSaramma Marise Knapper, MD   3 Units at 04/07/16 0604  . insulin aspart (novoLOG) injection 0-5 Units  0-5 Units Subcutaneous QHS Jomarie LongsSaramma Anesa Fronek, MD   5 Units at 04/01/16 2158  . insulin aspart (novoLOG) injection 4 Units  4 Units Subcutaneous TID WC Jomarie LongsSaramma Rosemary Pentecost, MD   4 Units at 04/07/16 0605  . insulin glargine (LANTUS) injection 20 Units  20 Units Subcutaneous QHS Jomarie LongsSaramma Tahje Borawski, MD   20 Units at 04/06/16 2135  . lisinopril (PRINIVIL,ZESTRIL) tablet 10 mg  10 mg Oral Daily Kristeen MansFran E Hobson, NP   10 mg at 04/07/16 [id number]  . magnesium hydroxide (MILK OF MAGNESIA) suspension 30 mL  30 mL Oral Daily Kerry HoughSpencer E Simon, PA-C   30 mL at  04/07/16 0811  . nicotine polacrilex (NICORETTE) gum 2 mg  2 mg Oral PRN Jomarie LongsSaramma Karman Veney, MD      . OLANZapine (ZYPREXA) tablet 10 mg  10 mg Oral BID PRN Jomarie LongsSaramma Tsutomu Barfoot, MD       Or  . OLANZapine (ZYPREXA) injection 10 mg  10 mg Intramuscular BID PRN Jomarie LongsSaramma Jarion Hawthorne, MD      . temazepam (RESTORIL) capsule 15 mg  15 mg Oral QHS Jomarie LongsSaramma Audry Pecina, MD   15 mg at 04/06/16 2132    Lab Results:  Results for orders placed or performed during the hospital encounter of 03/19/16 (from the past 48 hour(s))  Glucose, capillary     Status: Abnormal   Collection Time: 04/05/16 12:11 PM  Result Value Ref Range   Glucose-Capillary 111 (H) 65 - 99 mg/dL  Glucose, capillary     Status: Abnormal   Collection Time: 04/05/16  5:18 PM  Result Value Ref Range   Glucose-Capillary 143 (H) 65 - 99 mg/dL  CBC with Differential/Platelet     Status: Abnormal   Collection Time: 04/05/16  6:55 PM  Result Value Ref Range   WBC 12.8 (H) 4.0 - 10.5 K/uL   RBC 4.27 4.22 - 5.81 MIL/uL   Hemoglobin 11.3 (L) 13.0 - 17.0 g/dL   HCT 98.134.0 (L) 19.139.0 - 47.852.0 %   MCV 79.6 78.0 - 100.0 fL   MCH 26.5 26.0 - 34.0 pg   MCHC 33.2 30.0 - 36.0 g/dL   RDW 29.514.1 62.111.5 - 30.815.5 %   Platelets 344 150 - 400 K/uL   Neutrophils Relative % 67 %   Lymphocytes Relative 23 %   Monocytes Relative 9 %   Eosinophils Relative 1 %   Basophils Relative 0 %   Neutro Abs 8.6 (H) 1.7 - 7.7 K/uL   Lymphs Abs 2.9 0.7 - 4.0 K/uL   Monocytes Absolute 1.2 (H) 0.1 - 1.0 K/uL   Eosinophils Absolute 0.1 0.0 - 0.7 K/uL   Basophils Absolute 0.0 0.0 - 0.1 K/uL   Smear Review LARGE PLATELETS PRESENT     Comment: [facility]  Glucose, capillary     Status: Abnormal   Collection Time: 04/05/16  8:30 PM  Result Value Ref Range   Glucose-Capillary 143 (H) 65 - 99 mg/dL  Glucose, capillary     Status: Abnormal   Collection Time: 04/06/16  6:09 AM  Result Value Ref Range   Glucose-Capillary 149 (H) 65 - 99 mg/dL  Glucose, capillary      Status: Abnormal   Collection Time: 04/06/16 12:06 PM  Result Value Ref Range   Glucose-Capillary 115 (  H) 65 - 99 mg/dL   Comment 1 Notify RN    Comment 2 Document in Chart   Glucose, capillary     Status: Abnormal   Collection Time: 04/06/16  4:58 PM  Result Value Ref Range   Glucose-Capillary 158 (H) 65 - 99 mg/dL   Comment 1 Notify RN    Comment 2 Document in Chart   Glucose, capillary     Status: Abnormal   Collection Time: 04/06/16  8:58 PM  Result Value Ref Range   Glucose-Capillary 141 (H) 65 - 99 mg/dL  Glucose, capillary     Status: Abnormal   Collection Time: 04/07/16  5:53 AM  Result Value Ref Range   Glucose-Capillary 160 (H) 65 - 99 mg/dL    Blood Alcohol level:  Lab Results  Component Value Date   ETH <5 03/18/2016   ETH <5 03/01/2016    Metabolic Disorder Labs: Lab Results  Component Value Date   HGBA1C 8.0 (H) 03/23/2016   MPG 183 03/23/2016   MPG 183 03/20/2016   Lab Results  Component Value Date   PROLACTIN 31.5 (H) 03/23/2016   PROLACTIN 36.6 (H) 03/20/2016   Lab Results  Component Value Date   CHOL 145 03/23/2016   TRIG 144 03/23/2016   HDL 30 (L) 03/23/2016   CHOLHDL 4.8 03/23/2016   VLDL 29 03/23/2016   LDLCALC 86 03/23/2016   LDLCALC 21 03/20/2016    Physical Findings: AIMS: Facial and Oral Movements Muscles of Facial Expression: None, normal Lips and Perioral Area: None, normal Jaw: None, normal Tongue: None, normal,Extremity Movements Upper (arms, wrists, hands, fingers): None, normal Lower (legs, knees, ankles, toes): None, normal, Trunk Movements Neck, shoulders, hips: None, normal, Overall Severity Severity of abnormal movements (highest score from questions above): None, normal Incapacitation due to abnormal movements: None, normal Patient's awareness of abnormal movements (rate only patient's report): No Awareness, Dental Status Current problems with teeth and/or dentures?: No Does patient usually wear dentures?: No   CIWA:  CIWA-Ar Total: 1 COWS:  COWS Total Score: 3  Musculoskeletal: Strength & Muscle Tone: within normal limits Gait & Station: normal Patient leans: N/A   Psychiatric Specialty Exam: Physical Exam  Nursing note and vitals reviewed.   Review of Systems  Psychiatric/Behavioral: Positive for depression and hallucinations. The patient is nervous/anxious and has insomnia.   All other systems reviewed and are negative.   Blood pressure 109/81, pulse (!) 102, temperature 98 F (36.7 C), temperature source Oral, resp. rate 20, height 5' 5.5" (1.664 m), weight 91.6 kg (202 lb), SpO2 100 %.Body mass index is 33.1 kg/m.  General Appearance: Fairly Groomed  Eye Contact:  Fair  Speech:  Pressured   Volume:  Normal  Mood:  Anxious, Dysphoric and Irritable  Affect:  Labile   Thought Process:  Disorganized, Irrelevant and Descriptions of Associations: Tangential   Orientation:  Full (Time, Place, and Person)  Thought Content:  Delusions, Obsessions, Paranoid Ideation, Rumination and Tangential   Suicidal Thoughts:  No continues to be delusional and disorganized  Homicidal Thoughts:  No  Memory:  Immediate;   Poor Recent;   Poor Remote;   Poor  Judgement:  Impaired  Insight:  Shallow  Psychomotor Activity:  Restlessness  Concentration:  Concentration: Poor and Attention Span: Poor  Recall:  [employer] of Knowledge:  Fair  Language:  Fair  Akathisia:  No  Handed:  Right  AIMS (if indicated):     Assets:  Desire for Improvement Resilience  ADL's:  Intact  Cognition:  WNL  Sleep:  Number of Hours: 4   03/31/16 CSW was able to obtain collateral information from his community support - who reported patient's baseline a few months ago was much better , he does have chronic delusions , but he was abel to take care of self and participate better in decision making discussions .        Treatment Plan Summary: Osvaldo AngstBarry D Mastandrea is a 55 year old AA male with history ofparanoid  schizophrenia, DM, HTN,who was found walking naked by neighbors, recommended by his case manager to be brought to the hospital for evaluation.  Patient today is disorganized and delusional , tolerating clozaril well. Awaiting [facility] placement.    Paranoid schizophrenia (HCC) currently unstable.  Will continue today 11/15 /17 plan as below except where it is noted.  Daily contact with patient to assess and evaluate symptoms and progress in treatment and Medication management    Will increase  Clozaril to 12.5 mg po daily and clozaril 50 mg po qhs for psychosis. Reviewed CBC with diff - 04/06/16- ANC- 8.6 /WBC- 12.8  wnl.needs weekly CBC/ANC. Will taper off  Geodon for lack of efficacy. Will continue  Depakote  ER to 1250 mg po qhs for mood lability , although  Depakote level  - 03/25/16- 72 ( therapeutic) ,  depakote level on 03/30/16- I have reviewed depakote level  - 74- therapeutic. Will continue Restoril 15 mg po qhs for sleep. Will continue to monitor vitals ,medication compliance and treatment side effects while patient is here.  Reviewed labs: EKG - qtc - wnl, TSH- wnl , hba1c- 8- dietician consult once patient is more stable  ,  pl -31.5 ,  lipid panel HDL - low - diet control recommendations to be provided when patient is more stable. CSW will continue  working on disposition. [facility]  [facility]. Patient to participate in therapeutic milieu Donata Reddick, MD 04/07/2016, 9:34 AM

## 2016-04-07 NOTE — Progress Notes (Signed)
Recreation Therapy Notes  Date: 04/07/16 Time: 1000 Location: 500 Hall Dayroom  Group Topic: Leisure Education  Goal Area(s) Addresses:  Patient will identify positive leisure activities.  Patient will identify one positive benefit of participation in leisure activities.   Intervention: Chairs, beach ball  Activity: Keep It Going Volleyball.  LRT arranged chairs in a circle.  Patients were to sit in the circle and pass the ball to each other until they reached the desired goal.  The ball could bounce off the group but could not roll to a stop on the floor.  If the ball were to come to a stop, the count would start over.    Education:  Leisure Education, Discharge Planning  Education Outcome: Acknowledges education/In group clarification offered/Needs additional education  Clinical Observations/Feedback: Pt did not attend group.   Natasha Burda, LRT/CTRS         Thora Scherman A 04/07/2016 12:41 PM 

## 2016-04-07 NOTE — Progress Notes (Signed)
Nursing Note 04/07/2016 [id number]  Data Reports sleeping good with PRN sleep med.  Refused to rate depression  hopelessness  and anxiety, declines these. Affect flat.  Denies HI, SI, AVH.  Was asking nurse for an extra shot of insulin stating "I need it because I want to smoke when I leave."  When talking with patient and asking him to verify his name he said he was "Ronald Roman." Denies physical complaints today.  Received dose of Maalox 0808.  In afternoon lotion and shampoo observed on patients beds in his room, patient admitted to applying lotion and shampoo on the beds stating "I was cleaning the mattress, and I wanted it to be soft."  Redirected and bed cleansed.  Observed throughout day talking to self as well as laughing at inappropriate times.  Action Spoke with patient 1:1, nurse offered support to patient throughout shift.  MD aware of Maalox.  Continues to be monitored on 15 minute checks for safety.  Response No adverse effects from medication.  Remains safe on unit.  Speech continues to be disorganized and appears to be responding to internal stimuli throughout the day.

## 2016-04-08 LAB — GLUCOSE, CAPILLARY
GLUCOSE-CAPILLARY: 122 mg/dL — AB (ref 65–99)
GLUCOSE-CAPILLARY: 88 mg/dL (ref 65–99)
GLUCOSE-CAPILLARY: 106 mg/dL — AB (ref 65–99)
GLUCOSE-CAPILLARY: 182 mg/dL — AB (ref 65–99)

## 2016-04-08 NOTE — Progress Notes (Signed)
D: Pt continues to be very confused, paranoid, disorganized and delusional. Pt while in the dayroom seized another Pt's pair of flip-flops claiming that the Pt was his daughter. Pt continues to actively hallucinating; was observed talking to self and laughing inappropriately. Pt denies any form of depression, anxiety, pain, SI, HI and AVH. Pt is more restless than usual today; Pt was observed pacing up and down the hall. A: Medications offered as prescribed.  Support, encouragement, and safe environment provided.  15-minute safety checks continue. R: Pt was med compliant.  Pt did not attend wrap-up group. Safety checks continue.

## 2016-04-08 NOTE — Plan of Care (Signed)
Problem: Education: Goal: Will be free of psychotic symptoms Outcome: Progressing Nurse discussed depression/coping skills/ taking care of his body with patient.

## 2016-04-08 NOTE — [facility] Group Notes (Signed)
[facility] Group Notes:  (Counselor/Nursing/MHT/Case Management/Adjunct)  04/08/2016 1:15PM  Type of Therapy:  Group Therapy  Participation Level:  Active  Participation Quality:  Appropriate  Affect:  Flat  Cognitive:  Oriented  Insight:  Improving  Engagement in Group:  Limited  Engagement in Therapy:  Limited  Modes of Intervention:  Discussion, Exploration and Socialization  Summary of Progress/Problems: The topic for group was balance in life.  Pt participated in the discussion about when their life was in balance and out of balance and how this feels.  Pt discussed ways to get back in balance and short term goals they can work on to get where they want to be. Stayed for much of group today, [facility].  Engaged throughout.  "I stay balanced by thinking of my family."  When asked who in particular in his family, he indicated it was the group leader.  Later, stated that balance is like acceptance, but was unable to expound on that.   Daryel Geraldorth, Jahliyah Trice B 04/08/2016 1:02 PM

## 2016-04-08 NOTE — Tx Team (Signed)
Interdisciplinary Treatment and Diagnostic Plan Update  04/08/2016 Time of Session: 10:13 AM  Ronald Roman MRN: [id number]  Principal Diagnosis: Paranoid schizophrenia [facility])  Secondary Diagnoses: Principal Problem:   Paranoid schizophrenia ([place])   Current Medications:  Current Facility-Administered Medications  Medication Dose Route Frequency Provider Last Rate Last Dose  . acetaminophen (TYLENOL) tablet 650 mg  650 mg Oral Q6H PRN Lurena Nida, NP   650 mg at 03/21/16 0329  . amantadine (SYMMETREL) capsule 100 mg  100 mg Oral BID Ethelene Hal, NP   100 mg at 04/08/16 0740  . cloZAPine (CLOZARIL) tablet 12.5 mg  12.5 mg Oral Daily Ursula Alert, MD   12.5 mg at 04/08/16 0739  . cloZAPine (CLOZARIL) tablet 50 mg  50 mg Oral QHS Ursula Alert, MD   50 mg at 04/07/16 2042  . divalproex (DEPAKOTE ER) 24 hr tablet 1,250 mg  1,250 mg Oral QHS Ursula Alert, MD   1,250 mg at 04/07/16 2043  . insulin aspart (novoLOG) injection 0-15 Units  0-15 Units Subcutaneous TID WC Ursula Alert, MD   2 Units at 04/08/16 [phone]  . insulin aspart (novoLOG) injection 0-5 Units  0-5 Units Subcutaneous QHS Ursula Alert, MD   5 Units at 04/01/16 2158  . insulin aspart (novoLOG) injection 4 Units  4 Units Subcutaneous TID WC Ursula Alert, MD   4 Units at 04/08/16 0643  . insulin glargine (LANTUS) injection 20 Units  20 Units Subcutaneous QHS Ursula Alert, MD   20 Units at 04/07/16 2108  . lisinopril (PRINIVIL,ZESTRIL) tablet 10 mg  10 mg Oral Daily Lurena Nida, NP   10 mg at 04/08/16 0740  . nicotine polacrilex (NICORETTE) gum 2 mg  2 mg Oral PRN Ursula Alert, MD      . OLANZapine (ZYPREXA) tablet 10 mg  10 mg Oral BID PRN Ursula Alert, MD   10 mg at 04/08/16 0839   Or  . OLANZapine (ZYPREXA) injection 10 mg  10 mg Intramuscular BID PRN Ursula Alert, MD      . temazepam (RESTORIL) capsule 15 mg  15 mg Oral QHS Ursula Alert, MD   15 mg at 04/07/16 2043    PTA  Medications: Prescriptions Prior to Admission  Medication Sig Dispense Refill Last Dose  . albuterol (PROVENTIL HFA;VENTOLIN HFA) 108 (90 BASE) MCG/ACT inhaler Inhale 2 puffs into the lungs every 4 (four) hours as needed for wheezing or shortness of breath. (Patient not taking: Reported on 03/19/2016) 1 Inhaler 0 Not Taking at Unknown time  . cetirizine (ZYRTEC) 10 MG chewable tablet Chew 1 tablet (10 mg total) by mouth daily. (Patient not taking: Reported on 03/19/2016) 20 tablet 1 Not Taking at Unknown time  . cloZAPine (CLOZARIL) 100 MG tablet Take 1 tablet (100 mg total) by mouth 2 (two) times daily. (Patient not taking: Reported on 03/19/2016) 60 tablet 0 Not Taking at Unknown time  . divalproex (DEPAKOTE) 250 MG DR tablet Take 1 tablet (250 mg total) by mouth at bedtime. (Patient not taking: Reported on 03/19/2016) 30 tablet 0 Not Taking at Unknown time  . divalproex (DEPAKOTE) 500 MG DR tablet Take 2 tablets (1,000 mg total) by mouth at bedtime. (Patient not taking: Reported on 03/19/2016) 60 tablet 0 Not Taking at Unknown time  . insulin glargine (LANTUS) 100 UNIT/ML injection Inject 10 Units into the skin at bedtime.   Not Taking at Unknown time  . lisinopril (PRINIVIL,ZESTRIL) 10 MG tablet Take 10 mg by mouth daily.  Not Taking at Unknown time  . LORazepam (ATIVAN) 0.5 MG tablet Take 1 tablet (0.5 mg total) by mouth 2 (two) times daily. (Patient not taking: Reported on 03/19/2016) 60 tablet 0 Not Taking at Unknown time  . metoprolol tartrate (LOPRESSOR) 25 MG tablet Take 25 mg by mouth 2 (two) times daily.   Not Taking at Unknown time  . QUEtiapine (SEROQUEL) 100 MG tablet Take 1 tablet (100 mg total) by mouth at bedtime. (Patient not taking: Reported on 03/19/2016) 30 tablet 0 Not Taking at Unknown time  . QUEtiapine (SEROQUEL) 50 MG tablet Take 1 tablet (50 mg total) by mouth daily. (Patient not taking: Reported on 03/19/2016) 30 tablet 0 Not Taking at Unknown time  . zolpidem (AMBIEN) 5  MG tablet Take 1 tablet (5 mg total) by mouth at bedtime as needed for sleep. (Patient not taking: Reported on 03/19/2016) 30 tablet 0 Not Taking at Unknown time    Treatment Modalities: Medication Management, Group therapy, Case management,  1 to 1 session with clinician, Psychoeducation, Recreational therapy.   Physician Treatment Plan for Primary Diagnosis: Paranoid schizophrenia ([place]) Long Term Goal(s): Improvement in symptoms so as ready for discharge  Short Term Goals: Ability to identify changes in lifestyle to reduce recurrence of condition will improve  Medication Management: Evaluate patient's response, side effects, and tolerance of medication regimen.  Therapeutic Interventions: 1 to 1 sessions, Unit Group sessions and Medication administration.  Evaluation of Outcomes: Not Met  Physician Treatment Plan for Secondary Diagnosis: Principal Problem:   Paranoid schizophrenia ([place])   Long Term Goal(s): Improvement in symptoms so as ready for discharge  Short Term Goals: Ability to identify and develop effective coping behaviors will improve  Medication Management: Evaluate patient's response, side effects, and tolerance of medication regimen.  Therapeutic Interventions: 1 to 1 sessions, Unit Group sessions and Medication administration.  Evaluation of Outcomes: Not Met  11/2: Patient today seen as disorganized, delusional - will change his clozaril to [place] - given his inability to be compliant with labs as well as taking his medications , and given the fact that he is going to return to his independent living situation with case management available . Will discontinue  Clozaril for inability to be compliant with labs. Will start a trial of Invega 3 mg po qhs for psychosis. Will offer Invega sustenna IM prior to discharge. Pt agrees with plan. Will continue Depakote , change to ER 1000 mg po qhs for mood sx. Depakote level  - 03/25/16. Will discontinue Trazodone for lack of  efficacy. Start Doxepin 10 mg po qhs for sleep.  11/7:  Will continue Invega  12 mg po qhs for psychosis. Will offer Invega sustenna IM prior to discharge. Pt agrees with plan. Increased  Depakote  ER to 1250 mg po qhs for mood lability , although Depakote level - 03/25/16- 72 ( therapeutic) ,  depakote level on 03/30/16- I have reviewed depakote level today - 74- therapeutic. Will increase  Doxepin to 50 mg po qhs for sleep. Will continue Ativan  1 mg po qhs for anxiety   11/13: Patient today seen as minimizing his sx, continues to be grandiose , delusional , has sleep issues . Started on a second antipsychotic yesterday to augment the effect of Invega . Will reduce Invega to 9 mg po qhs for psychosis. Will continue Geodon 40 mg po bid with meals for augmenting the effect of Invega . Increased  Depakote  ER to 1250 mg po qhs for mood  lability , although Depakote level - 03/25/16- 72 ( therapeutic) ,  depakote level on 03/30/16- I have reviewed depakote level today - 74- therapeutic. Will discontinue  Doxepin for lack of efficacy. Start Elavil 25 mg po qhs for sleep. Will continue Ativan  1 mg po qhs for anxiety   11/16:Patient continues to be disorganized ,continues to have delusional thoughts , irrelevant on and off and continues to have  inappropriate laughter.  Will increase  Clozaril to 12.5 mg po daily and clozaril 50 mg po qhs for psychosis. Reviewed CBC with diff - 04/06/16- ANC- 8.6 /WBC- 12.8  wnl.needs weekly CBC/ANC. Will taper off  Geodon for lack of efficacy. Will continue  Depakote  ER to 1250 mg po qhs for mood lability , although Depakote level - 03/25/16- 72 ( therapeutic) ,  depakote level on 03/30/16- I have reviewed depakote level  - 74- therapeutic. Will continue Restoril 15 mg po qhs for sleep.  RN Treatment Plan for Primary Diagnosis: Paranoid schizophrenia ([place]) Long Term Goal(s): Knowledge of disease and therapeutic regimen to maintain health will improve  Short  Term Goals: Compliance with prescribed medications will improve  Medication Management: RN will administer medications as ordered by provider, will assess and evaluate patient's response and provide education to patient for prescribed medication. RN will report any adverse and/or side effects to prescribing provider.  Therapeutic Interventions: 1 on 1 counseling sessions, Psychoeducation, Medication administration, Evaluate responses to treatment, Monitor vital signs and CBGs as ordered, Perform/monitor CIWA, COWS, AIMS and Fall Risk screenings as ordered, Perform wound care treatments as ordered.  Evaluation of Outcomes: Progressing   LCSW Treatment Plan for Primary Diagnosis: Paranoid schizophrenia ([place]) Long Term Goal(s): Safe transition to appropriate next level of care at discharge, Engage patient in therapeutic group addressing interpersonal concerns.  Short Term Goals: Engage patient in aftercare planning with referrals and resources  Therapeutic Interventions: Assess for all discharge needs, 1 to 1 time with Social worker, Explore available resources and support systems, Assess for adequacy in community support network, Educate family and significant other(s) on suicide prevention, Complete Psychosocial Assessment, Interpersonal group therapy.  Evaluation of Outcomes: Progressing  CSW left message for Melody Jefferey Pica transition coordinator at [phone]. 11/2:  [facility] does not support referral to [facility].  However, they are requesting referral to [facility] as a woman with whom Jahseh had a good relationship with in the past has transferred to that agency.  As noted above, we are trying to switch him to injectable due to recent history of non-compliance with meds, and the fact that he will not be monitored daily. 11/7:  [facility] came to see patient.  Unable to complete assessment due to delusions, disorganization 11/13: Pt visited by [facility] peer support last week-they report  he is far from baseline previous to first of three hospitalizations that began in October, and that he appears incapable of living independently.  [facility] has agreed that if he does not progress under our care, they will support [facility] referral 11/16: Pt is on the [facility] wait list. CSW to begin PASSR process this week for placement despite the lack of progress addressing mental health symptoms  Progress in Treatment: Attending groups: No Participating in groups: No Taking medication as prescribed: Yes Toleration medication: Yes, no side effects reported at this time Family/Significant other contact made: Yes  See above Patient understands diagnosis: No  Limited insight Discussing patient identified problems/goals with staff: Yes Medical problems stabilized or resolved: Yes Denies  suicidal/homicidal ideation: Yes Issues/concerns per patient self-inventory: None Other: N/A  New problem(s) identified: None identified at this time.   New Short Term/Long Term Goal(s): None identified at this time.   Discharge Plan or Barriers: See above  Reason for Continuation of Hospitalization: Disorganization Medication stabilization Delusions   Estimated Length of Stay: 3-5 days  Attendees: Patient: 04/08/2016  10:13 AM  Physician: Ursula Alert, MD 04/08/2016  10:13 AM  Nursing: Grayland Ormond, RN 04/08/2016  10:13 AM  RN Care Manager: Lars Pinks, RN 04/08/2016  10:13 AM  Social Worker: Ripley Fraise 04/08/2016  10:13 AM  Recreational Therapist: Laretta Bolster  04/08/2016  10:13 AM  Other: Norberto Sorenson 04/08/2016  10:13 AM  Other:  04/08/2016  10:13 AM    Scribe for Treatment Team:  Roque Lias LCSW 04/08/2016 10:13 AM

## 2016-04-08 NOTE — Progress Notes (Addendum)
Nurse left message for diabetic consult to call back.  CBG at 1700 was 106.  Novolog 4 units given per MD order.  NP informed and gave nurse instructions.

## 2016-04-08 NOTE — Progress Notes (Signed)
Patient's CBG at 1200 was 88.  Pharmacist, MD informed and call back requested for diabetic consult.  Patient stated he would eat all of his lunch.

## 2016-04-08 NOTE — Progress Notes (Signed)
[facility] Ronald Roman Progress Note  04/08/2016 11:24 AM Ronald Roman  MRN:  [id number] Subjective: Patient reports " I could not sleep , my children were up all night ."       Objective: Ronald Roman is a 55 year old AA male with history ofparanoid schizophrenia, DM, HTN,who was found walking naked by neighbors, recommended by his case manager to be brought to the hospital for evaluation.  Patient seen and chart reviewed.Discussed patient with treatment team.  Patient today continues to be delusional and makes irrelevant statements. Pt per staff - has been observed as actively hallucinating , has inappropriate laughter on and off , is seen as being intrusive on the unit often. Pt required PRN Zyprexa this AM. Pt reports sleep as poor - however sleep is noted as 5 hrs. Which is an improvement for patient. Pt denies any ADRs.         Principal Problem: Paranoid schizophrenia (HCC) Diagnosis:   Patient Active Problem List   Diagnosis Date Noted  . Acute psychosis [F23]   . Leukocytosis [D72.829] 02/06/2015  . Sleep apnea [G47.30] 02/05/2015  . AKI (acute kidney injury) (HCC) [N17.9] 02/04/2015  . Seizure disorder (HCC) [G40.909] 02/04/2015  . Acute encephalopathy [G93.40] 02/04/2015  . Diabetes mellitus without complication (HCC) [E11.9]   . Hyperlipidemia [E78.5]   . Hypertension [I10]   . Paranoid schizophrenia (HCC) [F20.0]    Total Time spent with patient: 25 minutes  Past Psychiatric History: Please see H&P.   Past Medical History:  Past Medical History:  Diagnosis Date  . Diabetes mellitus without complication (HCC)   . GERD (gastroesophageal reflux disease)   . Hyperlipidemia   . Hypertension   . Paranoid schizophrenia (HCC)   . Sleep apnea   . Uric acid nephrolithiasis    History reviewed. Roman pertinent surgical history. Family History: Patient denies Family Psychiatric  History: Pt denies Social History: Patient reports [place] , is single ,  has children who does not live with him , reports he gets food stamps . History  Alcohol Use Roman     History  Drug Use Roman    Social History   Social History  . Marital status: Single    Spouse name: N/A  . Number of children: N/A  . Years of education: N/A   Social History Main Topics  . Smoking status: Current Every Day Smoker    Packs/day: 1.00    Types: Cigarettes  . Smokeless tobacco: Never Used  . Alcohol use Roman  . Drug use: Roman  . Sexual activity: Not Currently   Other Topics Concern  . None   Social History Narrative  . None   Additional Social History:                         Sleep: noted as 5 hrs - pt reports it as poor which is incorrect  Appetite:  Fair  Current Medications: Current Facility-Administered Medications  Medication Dose Route Frequency Provider Last Rate Last Dose  . acetaminophen (TYLENOL) tablet 650 mg  650 mg Oral Q6H PRN Ronald Mans, Ronald Roman   650 mg at 03/21/16 0329  . amantadine (SYMMETREL) capsule 100 mg  100 mg Oral BID Ronald Abbe, Ronald Roman   100 mg at 04/08/16 0740  . cloZAPine (CLOZARIL) tablet 12.5 mg  12.5 mg Oral Daily Ronald Longs, Ronald Roman   12.5 mg at 04/08/16 0739  . cloZAPine (CLOZARIL) tablet  50 mg  50 mg Oral QHS Ronald LongsSaramma Bellanie Matthew, Ronald Roman   50 mg at 04/07/16 2042  . divalproex (DEPAKOTE ER) 24 hr tablet 1,250 mg  1,250 mg Oral QHS Ronald LongsSaramma Shadia Larose, Ronald Roman   1,250 mg at 04/07/16 2043  . insulin aspart (novoLOG) injection 0-15 Units  0-15 Units Subcutaneous TID WC Ronald LongsSaramma Kris Roman, Ronald Roman   2 Units at 04/08/16 [phone]  . insulin aspart (novoLOG) injection 0-5 Units  0-5 Units Subcutaneous QHS Ronald LongsSaramma Amirra Herling, Ronald Roman   5 Units at 04/01/16 2158  . insulin aspart (novoLOG) injection 4 Units  4 Units Subcutaneous TID WC Ronald LongsSaramma Jonette Wassel, Ronald Roman   4 Units at 04/08/16 0643  . insulin glargine (LANTUS) injection 20 Units  20 Units Subcutaneous QHS Ronald LongsSaramma Arlin Savona, Ronald Roman   20 Units at 04/07/16 2108  . lisinopril (PRINIVIL,ZESTRIL) tablet 10 mg  10 mg Oral Daily Ronald MansFran  E Hobson, Ronald Roman   10 mg at 04/08/16 0740  . nicotine polacrilex (NICORETTE) gum 2 mg  2 mg Oral PRN Ronald LongsSaramma Makyiah Lie, Ronald Roman      . OLANZapine (ZYPREXA) tablet 10 mg  10 mg Oral BID PRN Ronald LongsSaramma Sonji Starkes, Ronald Roman   10 mg at 04/08/16 0839   Or  . OLANZapine (ZYPREXA) injection 10 mg  10 mg Intramuscular BID PRN Ronald LongsSaramma Rebeka Kimble, Ronald Roman      . temazepam (RESTORIL) capsule 15 mg  15 mg Oral QHS Ronald LongsSaramma Gottfried Standish, Ronald Roman   15 mg at 04/07/16 2043    Lab Results:  Results for orders placed or performed during the hospital encounter of 03/19/16 (from the past 48 hour(s))  Glucose, capillary     Status: Abnormal   Collection Time: 04/06/16 12:06 PM  Result Value Ref Range   Glucose-Capillary 115 (H) 65 - 99 mg/dL   Comment 1 Notify RN    Comment 2 Document in Chart   Glucose, capillary     Status: Abnormal   Collection Time: 04/06/16  4:58 PM  Result Value Ref Range   Glucose-Capillary 158 (H) 65 - 99 mg/dL   Comment 1 Notify RN    Comment 2 Document in Chart   Glucose, capillary     Status: Abnormal   Collection Time: 04/06/16  8:58 PM  Result Value Ref Range   Glucose-Capillary 141 (H) 65 - 99 mg/dL  Glucose, capillary     Status: Abnormal   Collection Time: 04/07/16  5:53 AM  Result Value Ref Range   Glucose-Capillary 160 (H) 65 - 99 mg/dL  Glucose, capillary     Status: None   Collection Time: 04/07/16 11:55 AM  Result Value Ref Range   Glucose-Capillary 94 65 - 99 mg/dL  Glucose, capillary     Status: Abnormal   Collection Time: 04/07/16  5:10 PM  Result Value Ref Range   Glucose-Capillary 104 (H) 65 - 99 mg/dL   Comment 1 Notify RN    Comment 2 Document in Chart   Glucose, capillary     Status: Abnormal   Collection Time: 04/07/16  8:27 PM  Result Value Ref Range   Glucose-Capillary 148 (H) 65 - 99 mg/dL   Comment 1 Notify RN   Glucose, capillary     Status: Abnormal   Collection Time: 04/08/16  6:15 AM  Result Value Ref Range   Glucose-Capillary 122 (H) 65 - 99 mg/dL    Blood Alcohol level:  Lab  Results  Component Value Date   ETH <5 03/18/2016   ETH <5 03/01/2016    Metabolic Disorder Labs: Lab Results  Component Value Date   HGBA1C 8.0 (H) 03/23/2016   MPG 183 03/23/2016   MPG 183 03/20/2016   Lab Results  Component Value Date   PROLACTIN 31.5 (H) 03/23/2016   PROLACTIN 36.6 (H) 03/20/2016   Lab Results  Component Value Date   CHOL 145 03/23/2016   TRIG 144 03/23/2016   HDL 30 (L) 03/23/2016   CHOLHDL 4.8 03/23/2016   VLDL 29 03/23/2016   LDLCALC 86 03/23/2016   LDLCALC 21 03/20/2016    Physical Findings: AIMS: Facial and Oral Movements Muscles of Facial Expression: None, normal Lips and Perioral Area: None, normal Jaw: None, normal Tongue: None, normal,Extremity Movements Upper (arms, wrists, hands, fingers): None, normal Lower (legs, knees, ankles, toes): None, normal, Trunk Movements Neck, shoulders, hips: None, normal, Overall Severity Severity of abnormal movements (highest score from questions above): None, normal Incapacitation due to abnormal movements: None, normal Patient's awareness of abnormal movements (rate only patient's report): Roman Awareness, Dental Status Current problems with teeth and/or dentures?: Roman Does patient usually wear dentures?: Roman  CIWA:  CIWA-Ar Total: 1 COWS:  COWS Total Score: 3  Musculoskeletal: Strength & Muscle Tone: within normal limits Gait & Station: normal Patient leans: N/A   Psychiatric Specialty Exam: Physical Exam  Nursing note and vitals reviewed.   Review of Systems  Psychiatric/Behavioral: Positive for depression and hallucinations. The patient is nervous/anxious and has insomnia.   All other systems reviewed and are negative.   Blood pressure 116/83, pulse (!) 113, temperature 98.6 F (37 C), temperature source Oral, resp. rate 16, height 5' 5.5" (1.664 m), weight 91.6 kg (202 lb), SpO2 100 %.Body mass index is 33.1 kg/m.  General Appearance: Fairly Groomed  Eye Contact:  Fair  Speech:  Pressured    Volume:  Normal  Mood:  Anxious, Dysphoric and Irritable on and off   Affect:  Inappropriate and Labile   Thought Process:  Disorganized, Irrelevant and Descriptions of Associations: Loose   Orientation:  Full (Time, Place, and Person)  Thought Content:  Delusions, Obsessions, Paranoid Ideation, Rumination and Tangential   Suicidal Thoughts:  Roman continues to be delusional and disorganized  Homicidal Thoughts:  Roman  Memory:  Immediate;   Poor Recent;   Poor Remote;   Poor  Judgement:  Impaired  Insight:  Shallow  Psychomotor Activity:  Restlessness  Concentration:  Concentration: Poor and Attention Span: Poor  Recall:  Fair  Fund of Knowledge:  Fair  Language:  Fair  Akathisia:  Roman  Handed:  Right  AIMS (if indicated):     Assets:  Desire for Improvement  ADL's:  Intact  Cognition:  WNL  Sleep:  Number of Hours: 5   03/31/16 CSW was able to obtain collateral information from his community support - who reported patient's baseline a few months ago was much better , he does have chronic delusions , but he was abel to take care of self and participate better in decision making discussions .        Treatment Plan Summary: AMAHRI DENGEL is a 55 year old AA male with history ofparanoid schizophrenia, DM, HTN,who was found walking naked by neighbors, recommended by his case manager to be brought to the hospital for evaluation.  Patient today continues to be psychotic and inappropriate on the unit. Awaiting [facility] placement.    Paranoid schizophrenia (HCC) currently unstable.  Will continue today 04/08/16  plan as below except where it is noted.  Daily contact with patient to assess and evaluate symptoms  and progress in treatment and Medication management    Will continue  Clozaril  12.5 mg po daily and clozaril 50 mg po qhs for psychosis. Reviewed CBC with diff - 04/06/16- ANC- 8.6 /WBC- 12.8  wnl.needs weekly CBC/ANC. Will continue  Depakote  ER to 1250 mg po qhs for mood  lability , although  Depakote level  - 03/25/16- 72 ( therapeutic) ,  depakote level on 03/30/16- I have reviewed depakote level  - 74- therapeutic. Will continue Restoril 15 mg po qhs for sleep. Will continue to monitor vitals ,medication compliance and treatment side effects while patient is here.  Reviewed labs: EKG - qtc - wnl, TSH- wnl , hba1c- 8- dietician consult once patient is more stable  ,  pl -31.5 ,  lipid panel HDL - low - diet control recommendations to be provided when patient is more stable. CSW will continue  working on disposition. [facility].Pt is on their wait list. Patient to participate in therapeutic milieu Nhan Qualley, Ronald Roman 04/08/2016, 11:24 AM

## 2016-04-08 NOTE — Progress Notes (Signed)
Recreation Therapy Notes  Date: 04/08/16 Time: 1000 Location: 500 [address] Dayroom  Group Topic: Coping Skills  Goal Area(s) Addresses:  Pt will able to identify positive coping skills. Pt will able to identify the importance of coping skills. Pt will able to identify the importance of using coping skills post d/c.  Intervention: Web design worksheet, pencils  Activity: Web Design.  Patients were to identify the challenges they have been facing the seem to have them "stuck" and write them within the web design.  Once the patients identified their challenges, they were to identify at least 2 coping skills for each challenge they identified.  Education: Coping Skills, Discharge Planning.   Education Outcome: Acknowledges understanding/In group clarification offered/Needs additional education.   Clinical Observations/Feedback: Pt did not attend group.   Jacquline Terrill, LRT/CTRS        Jerimie Mancuso A 04/08/2016 11:46 AM 

## 2016-04-08 NOTE — Progress Notes (Signed)
D:  Patient's self inventory sheet, patient sleeps good, sleep medication given.  Good appetite, hyper energy level, poor concentration.  Denied anxiety, depression, hopeless.  Withdrawals yes, but did not mark any problems.  SI, contracts for safety while talking to nurse.  Denied physical problems today.  Denied pain.  No goal or plan today.  Does have discharge plans.  "In April went hospital." A:  Medications administered per MD orders.  Emotional support and encouragement given patient. R:  While talking to nurse, the patient denied A/V hallucinations.   Denied SI and HI, contracts for safety.  Worried over other people on  the unit.  Agitated over other people and talking.   PRN zyprexa given to patient this morning.

## 2016-04-09 LAB — GLUCOSE, CAPILLARY
GLUCOSE-CAPILLARY: 138 mg/dL — AB (ref 65–99)
GLUCOSE-CAPILLARY: 141 mg/dL — AB (ref 65–99)
Glucose-Capillary: 135 mg/dL — ABNORMAL HIGH (ref 65–99)
Glucose-Capillary: 91 mg/dL (ref 65–99)

## 2016-04-09 MED ORDER — CLOZAPINE 25 MG PO TABS
75.0000 mg | ORAL_TABLET | Freq: Every day | ORAL | Status: DC
  Administered 2016-04-09 – 2016-04-11 (×3): 75 mg via ORAL
  Filled 2016-04-09 (×4): qty 3

## 2016-04-09 NOTE — Progress Notes (Signed)
Patient has been pacing the hall, patient continues to remain delusion stating he is the king of the world and he owns everything in [place].  Patient has has had no issues of behavioral dsycontol  Assess patient for safety, continue to monitor as prescribed. Engage patient in 1:1 staff talks.   Continue to monitor as prescribed.  

## 2016-04-09 NOTE — [facility] Group Notes (Signed)
[facility] LCSW Group Therapy  04/09/2016  1:05 PM  Type of Therapy:  Group therapy  Participation Level:  Active  Participation Quality:  Attentive  Affect:  Flat  Cognitive:  Oriented  Insight:  Limited  Engagement in Therapy:  Limited  Modes of Intervention:  Discussion, Socialization  Summary of Progress/Problems:  Chaplain was here to lead a group on themes of hope and courage. Invited.  Chose to not attend.  Daryel Geraldorth, Celvin Taney B 04/09/2016 1:24 PM

## 2016-04-09 NOTE — Progress Notes (Signed)
Recreation Therapy Notes  Date: 04/08/16 Time: 1000 Location: 500 Hall Dayroom  Group Topic: Stress Management  Goal Area(s) Addresses:  Patient will verbalize importance of using healthy stress management.  Patient will identify positive emotions associated with healthy stress management.   Intervention: Guided Training and development officermagery Script, Calm App  Activity :  Child psychotherapistavorite Place Imagery; Body Scan Meditation.  LRT introduced the stress management techniques of guided imagery and meditation to patients.  Patients were given to opportunity participate in the techniques by following along as LRT read script for the guided imagery and listening to the recorded meditation.  Education:  Stress Management, Discharge Planning.   Education Outcome: Acknowledges edcuation/In group clarification offered/Needs additional education  Clinical Observations/Feedback: Pt did not attend group.     Ronald Roman, LRT/CTRS         Ronald RancherLindsay, Markevion Lattin A 04/09/2016 12:02 PM

## 2016-04-09 NOTE — Progress Notes (Signed)
D: Pt continues to be very disorganized, paranoid, and delusional; Pt states, "My name is Ronald Roman; I am waiting for my limo to come pick me." Pt continues to actively hallucinating; was observed talking to self and laughing inappropriately. Pt denies any form of depression, anxiety, pain, SI, HI and AVH. Pt is very restless; was observed pacing up and down the hall. A: Medications offered as prescribed.  Support, encouragement, and safe environment provided.  15-minute safety checks continue. R: Pt was med compliant.  Pt did not attend wrap-up group. Safety checks continue.

## 2016-04-09 NOTE — Progress Notes (Signed)
[facility] MD Progress Note  04/09/2016 12:03 PM Ronald Roman  MRN:  [id number] Subjective: Patient reports " I want to go home for the festival.'        Objective: Ronald Roman is a 55 year old AA male with history ofparanoid schizophrenia, DM, HTN,who was found walking naked by neighbors, recommended by his case manager to be brought to the hospital for evaluation.  Patient seen and chart reviewed.Discussed patient with treatment team.  Patient today continues to have inappropriate laughter , seen as making delusional comments on and off. Pt continues to believe some of the staff here and some of his peers are his children . Pt continues to need redirection on the unit. Pt has been taking his medications , denies ADRs. Pt has been sleeping better , but he sleeps on the bench in his room and not on his bed. Pt with poor insight , is disorganized , continues to need treatment.          Principal Problem: Paranoid schizophrenia (HCC) Diagnosis:   Patient Active Problem List   Diagnosis Date Noted  . Acute psychosis [F23]   . Leukocytosis [D72.829] 02/06/2015  . Sleep apnea [G47.30] 02/05/2015  . AKI (acute kidney injury) (HCC) [N17.9] 02/04/2015  . Seizure disorder (HCC) [G40.909] 02/04/2015  . Acute encephalopathy [G93.40] 02/04/2015  . Diabetes mellitus without complication (HCC) [E11.9]   . Hyperlipidemia [E78.5]   . Hypertension [I10]   . Paranoid schizophrenia (HCC) [F20.0]    Total Time spent with patient: 25 minutes  Past Psychiatric History: Please see H&P.   Past Medical History:  Past Medical History:  Diagnosis Date  . Diabetes mellitus without complication (HCC)   . GERD (gastroesophageal reflux disease)   . Hyperlipidemia   . Hypertension   . Paranoid schizophrenia (HCC)   . Sleep apnea   . Uric acid nephrolithiasis    History reviewed. No pertinent surgical history. Family History: Patient denies Family Psychiatric  History: Pt denies Social  History: Patient reports he lives in [place] , is single , has children who does not live with him , reports he gets food stamps . History  Alcohol Use No     History  Drug Use No    Social History   Social History  . Marital status: Single    Spouse name: N/A  . Number of children: N/A  . Years of education: N/A   Social History Main Topics  . Smoking status: Current Every Day Smoker    Packs/day: 1.00    Types: Cigarettes  . Smokeless tobacco: Never Used  . Alcohol use No  . Drug use: No  . Sexual activity: Not Currently   Other Topics Concern  . None   Social History Narrative  . None   Additional Social History:                         Sleep: Fair  Appetite:  Fair  Current Medications: Current Facility-Administered Medications  Medication Dose Route Frequency Provider Last Rate Last Dose  . acetaminophen (TYLENOL) tablet 650 mg  650 mg Oral Q6H PRN Kristeen Mans, NP   650 mg at 03/21/16 0329  . amantadine (SYMMETREL) capsule 100 mg  100 mg Oral BID Laveda Abbe, NP   100 mg at 04/09/16 0810  . cloZAPine (CLOZARIL) tablet 12.5 mg  12.5 mg Oral Daily Sindy Mccune, MD   12.5 mg at 04/09/16 0810  .  cloZAPine (CLOZARIL) tablet 75 mg  75 mg Oral QHS Levan Aloia, MD      . divalproex (DEPAKOTE ER) 24 hr tablet 1,250 mg  1,250 mg Oral QHS Ronald LongsSaramma Litzy Dicker, MD   1,250 mg at 04/08/16 2120  . insulin aspart (novoLOG) injection 0-15 Units  0-15 Units Subcutaneous TID WC Ronald LongsSaramma Maija Biggers, MD   2 Units at 04/09/16 [id number]  . insulin aspart (novoLOG) injection 0-5 Units  0-5 Units Subcutaneous QHS Ronald LongsSaramma Silvia Markuson, MD   5 Units at 04/01/16 2158  . insulin aspart (novoLOG) injection 4 Units  4 Units Subcutaneous TID WC Ronald LongsSaramma Nonna Renninger, MD   4 Units at 04/09/16 0643  . insulin glargine (LANTUS) injection 20 Units  20 Units Subcutaneous QHS Ronald LongsSaramma Ludwika Rodd, MD   20 Units at 04/08/16 2221  . lisinopril (PRINIVIL,ZESTRIL) tablet 10 mg  10 mg Oral Daily Kristeen MansFran E Hobson,  NP   10 mg at 04/09/16 0810  . nicotine polacrilex (NICORETTE) gum 2 mg  2 mg Oral PRN Ronald LongsSaramma Johnwilliam Shepperson, MD      . OLANZapine (ZYPREXA) tablet 10 mg  10 mg Oral BID PRN Ronald LongsSaramma Charisa Twitty, MD   10 mg at 04/08/16 0839   Or  . OLANZapine (ZYPREXA) injection 10 mg  10 mg Intramuscular BID PRN Ronald LongsSaramma Wade Sigala, MD      . temazepam (RESTORIL) capsule 15 mg  15 mg Oral QHS Ronald LongsSaramma Mirabella Hilario, MD   15 mg at 04/08/16 2120    Lab Results:  Results for orders placed or performed during the hospital encounter of 03/19/16 (from the past 48 hour(s))  Glucose, capillary     Status: Abnormal   Collection Time: 04/07/16  5:10 PM  Result Value Ref Range   Glucose-Capillary 104 (H) 65 - 99 mg/dL   Comment 1 Notify RN    Comment 2 Document in Chart   Glucose, capillary     Status: Abnormal   Collection Time: 04/07/16  8:27 PM  Result Value Ref Range   Glucose-Capillary 148 (H) 65 - 99 mg/dL   Comment 1 Notify RN   Glucose, capillary     Status: Abnormal   Collection Time: 04/08/16  6:15 AM  Result Value Ref Range   Glucose-Capillary 122 (H) 65 - 99 mg/dL  Glucose, capillary     Status: None   Collection Time: 04/08/16 11:51 AM  Result Value Ref Range   Glucose-Capillary 88 65 - 99 mg/dL   Comment 1 Notify RN   Glucose, capillary     Status: Abnormal   Collection Time: 04/08/16  5:04 PM  Result Value Ref Range   Glucose-Capillary 106 (H) 65 - 99 mg/dL   Comment 1 Notify RN   Glucose, capillary     Status: Abnormal   Collection Time: 04/08/16  8:05 PM  Result Value Ref Range   Glucose-Capillary 182 (H) 65 - 99 mg/dL  Glucose, capillary     Status: Abnormal   Collection Time: 04/09/16  6:12 AM  Result Value Ref Range   Glucose-Capillary 135 (H) 65 - 99 mg/dL    Blood Alcohol level:  Lab Results  Component Value Date   ETH <5 03/18/2016   ETH <5 03/01/2016    Metabolic Disorder Labs: Lab Results  Component Value Date   HGBA1C 8.0 (H) 03/23/2016   MPG 183 03/23/2016   MPG 183 03/20/2016   Lab  Results  Component Value Date   PROLACTIN 31.5 (H) 03/23/2016   PROLACTIN 36.6 (H) 03/20/2016   Lab Results  Component  Value Date   CHOL 145 03/23/2016   TRIG 144 03/23/2016   HDL 30 (L) 03/23/2016   CHOLHDL 4.8 03/23/2016   VLDL 29 03/23/2016   LDLCALC 86 03/23/2016   LDLCALC 21 03/20/2016    Physical Findings: AIMS: Facial and Oral Movements Muscles of Facial Expression: None, normal Lips and Perioral Area: None, normal Jaw: None, normal Tongue: None, normal,Extremity Movements Upper (arms, wrists, hands, fingers): None, normal Lower (legs, knees, ankles, toes): None, normal, Trunk Movements Neck, shoulders, hips: None, normal, Overall Severity Severity of abnormal movements (highest score from questions above): None, normal Incapacitation due to abnormal movements: None, normal Patient's awareness of abnormal movements (rate only patient's report): No Awareness, Dental Status Current problems with teeth and/or dentures?: No Does patient usually wear dentures?: No  CIWA:  CIWA-Ar Total: 1 COWS:  COWS Total Score: 3  Musculoskeletal: Strength & Muscle Tone: within normal limits Gait & Station: normal Patient leans: N/A   Psychiatric Specialty Exam: Physical Exam  Nursing note and vitals reviewed.   Review of Systems  Psychiatric/Behavioral: Positive for hallucinations. The patient is nervous/anxious.   All other systems reviewed and are negative.   Blood pressure 130/83, pulse 88, temperature 98.2 F (36.8 C), temperature source Oral, resp. rate 18, height 5' 5.5" (1.664 m), weight 91.6 kg (202 lb), SpO2 100 %.Body mass index is 33.1 kg/m.  General Appearance: Fairly Groomed  Eye Contact:  Fair  Speech:  Pressured   Volume:  Normal  Mood:  Anxious, Dysphoric and Irritable periodic through out the day  Affect:  Inappropriate and Labile smiles to self  Thought Process:  Disorganized, Irrelevant and Descriptions of Associations: Loose   Orientation:  Other:   person, place  Thought Content:  Delusions, Paranoid Ideation and Tangential   Suicidal Thoughts:  No continues to be delusional and disorganized  Homicidal Thoughts:  No  Memory:  Immediate;   Poor Recent;   Poor Remote;   Poor  Judgement:  Impaired  Insight:  Shallow  Psychomotor Activity:  Restlessness  Concentration:  Concentration: Poor and Attention Span: Poor  Recall:  Fair  Fund of Knowledge:  Fair  Language:  Fair  Akathisia:  No  Handed:  Right  AIMS (if indicated):     Assets:  Desire for Improvement  ADL's:  Intact  Cognition:  WNL  Sleep:  Number of Hours: 5   03/31/16 CSW was able to obtain collateral information from his community support - who reported patient's baseline a few months ago was much better , he does have chronic delusions , but he was abel to take care of self and participate better in decision making discussions .        Treatment Plan Summary: MEMPHIS DECOTEAU is a 55 year old AA male with history ofparanoid schizophrenia, DM, HTN,who was found walking naked by neighbors, recommended by his case manager to be brought to the hospital for evaluation.  Patient today continues to be labile , delusional . Awaiting [facility] placement.    Paranoid schizophrenia (HCC) currently unstable.  Will continue today 04/09/16  plan as below except where it is noted.  Daily contact with patient to assess and evaluate symptoms and progress in treatment and Medication management    Will continue  Clozaril  12.5 mg po daily and increase clozaril to 75 mg po qhs for psychosis. Reviewed CBC with diff - 04/06/16- ANC- 8.6 /WBC- 12.8  wnl.needs weekly CBC/ANC. Will continue  Depakote  ER to 1250 mg po  qhs for mood lability , although  Depakote level  - 03/25/16- 72 ( therapeutic) ,  depakote level on 03/30/16- I have reviewed depakote level  - 74- therapeutic. Will continue Restoril 15 mg po qhs for sleep. Will continue to monitor vitals ,medication compliance and  treatment side effects while patient is here.  Reviewed labs: EKG - qtc - wnl, TSH- wnl , hba1c- 8- dietician consult once patient is more stable  ,  pl -31.5 ,  lipid panel HDL - low - diet control recommendations to be provided when patient is more stable. CSW will continue  working on disposition. [facility]  [facility].Pt is on their wait list. Patient to participate in therapeutic milieu Andrick Rust, MD 04/09/2016, 12:03 PM

## 2016-04-10 DIAGNOSIS — Z794 Long term (current) use of insulin: Secondary | ICD-10-CM

## 2016-04-10 DIAGNOSIS — F17210 Nicotine dependence, cigarettes, uncomplicated: Secondary | ICD-10-CM

## 2016-04-10 LAB — GLUCOSE, CAPILLARY
GLUCOSE-CAPILLARY: 86 mg/dL (ref 65–99)
GLUCOSE-CAPILLARY: 116 mg/dL — AB (ref 65–99)
Glucose-Capillary: 142 mg/dL — ABNORMAL HIGH (ref 65–99)
Glucose-Capillary: 108 mg/dL — ABNORMAL HIGH (ref 65–99)
Glucose-Capillary: 151 mg/dL — ABNORMAL HIGH (ref 65–99)

## 2016-04-10 NOTE — [facility] Counselor (Signed)
Clinical Social Work Note  State PASRR reviewer met with patient this morning, requested and was given last 3 days of patient's doctor progress notes.  Writer signed his documentation that he was here and did review.  Gaither Biehn Grossman-Orr, LCSW 04/10/2016, 9:53 AM  

## 2016-04-10 NOTE — [facility] Group Notes (Signed)
[facility] Group Notes: (Clinical Social Work)   04/10/2016      Type of Therapy:  Group Therapy   Participation Level:  Did Not Attend despite [facility]HT prompting and CSW invitation - he came into the room 5-6 times but immediately left and when invited to stay, shook his head.   Ronald MantleMareida Grossman-Orr, LCSW 04/10/2016, 12:23 PM

## 2016-04-10 NOTE — Progress Notes (Signed)
[facility]  04/10/2016 2:23 PM Ronald Roman  MRN:  [id number] Subjective: Patient reports " I love you."  Objective: Ronald Roman is a 55 year old AA male with history ofparanoid schizophrenia, DM, HTN,who was found walking naked by neighbors, recommended by his case manager to be brought to the hospital for evaluation.  Patient seen and chart reviewed.Discussed patient with treatment team.  Pt continues to need redirection on the unit. Pt has been taking his medications , denies ADRs. At the moment, is not disruptive to the milieu Pt with poor insight and continues to need treatment.   Principal Problem: Paranoid schizophrenia (HCC) Diagnosis:   Patient Active Problem List   Diagnosis Date Noted  . Acute psychosis [F23]   . Leukocytosis [D72.829] 02/06/2015  . Sleep apnea [G47.30] 02/05/2015  . AKI (acute kidney injury) (HCC) [N17.9] 02/04/2015  . Seizure disorder (HCC) [G40.909] 02/04/2015  . Acute encephalopathy [G93.40] 02/04/2015  . Diabetes mellitus without complication (HCC) [E11.9]   . Hyperlipidemia [E78.5]   . Hypertension [I10]   . Paranoid schizophrenia (HCC) [F20.0]    Total Time spent with patient: 25 minutes  Past Psychiatric History: Please see H&P.   Past Medical History:  Past Medical History:  Diagnosis Date  . Diabetes mellitus without complication (HCC)   . GERD (gastroesophageal reflux disease)   . Hyperlipidemia   . Hypertension   . Paranoid schizophrenia (HCC)   . Sleep apnea   . Uric acid nephrolithiasis    History reviewed. No pertinent surgical history. Family History: Patient denies Family Psychiatric  History: Pt denies Social History: Patient [place] , is single , has children who does not live with him , reports he gets food stamps . History  Alcohol Use No     History  Drug Use No    Social History   Social History  . Marital status: Single    Spouse name: N/A  . Number of children:  N/A  . Years of education: N/A   Social History Main Topics  . Smoking status: Current Every Day Smoker    Packs/day: 1.00    Types: Cigarettes  . Smokeless tobacco: Never Used  . Alcohol use No  . Drug use: No  . Sexual activity: Not Currently   Other Topics Concern  . None   Social History Narrative  . None   Additional Social History:       Sleep: Fair  Appetite:  Fair  Current Medications: Current Facility-Administered Medications  Medication Dose Route Frequency Provider Last Rate Last Dose  . acetaminophen (TYLENOL) tablet 650 mg  650 mg Oral Q6H PRN Kristeen MansFran E Hobson, NP   650 mg at 03/21/16 0329  . amantadine (SYMMETREL) capsule 100 mg  100 mg Oral BID Laveda AbbeLaurie Britton Parks, NP   100 mg at 04/10/16 0805  . cloZAPine (CLOZARIL) tablet 12.5 mg  12.5 mg Oral Daily Jomarie LongsSaramma Eappen, MD   12.5 mg at 04/10/16 0804  . cloZAPine (CLOZARIL) tablet 75 mg  75 mg Oral QHS Jomarie LongsSaramma Eappen, MD   75 mg at 04/09/16 2026  . divalproex (DEPAKOTE ER) 24 hr tablet 1,250 mg  1,250 mg Oral QHS Jomarie LongsSaramma Eappen, MD   1,250 mg at 04/09/16 2026  . insulin aspart (novoLOG) injection 0-15 Units  0-15 Units Subcutaneous TID WC Jomarie LongsSaramma Eappen, MD   2 Units at 04/10/16 0627  . insulin aspart (novoLOG) injection 0-5 Units  0-5 Units Subcutaneous QHS Saramma Eappen,  MD   5 Units at 04/01/16 2158  . insulin aspart (novoLOG) injection 4 Units  4 Units Subcutaneous TID WC Jomarie Longs, MD   4 Units at 04/10/16 1149  . insulin glargine (LANTUS) injection 20 Units  20 Units Subcutaneous QHS Jomarie Longs, MD   20 Units at 04/09/16 2122  . lisinopril (PRINIVIL,ZESTRIL) tablet 10 mg  10 mg Oral Daily Kristeen Mans, NP   10 mg at 04/10/16 0804  . nicotine polacrilex (NICORETTE) gum 2 mg  2 mg Oral PRN Jomarie Longs, MD      . OLANZapine (ZYPREXA) tablet 10 mg  10 mg Oral BID PRN Jomarie Longs, MD   10 mg at 04/08/16 0839   Or  . OLANZapine (ZYPREXA) injection 10 mg  10 mg Intramuscular BID PRN Jomarie Longs,  MD      . temazepam (RESTORIL) capsule 15 mg  15 mg Oral QHS Jomarie Longs, MD   15 mg at 04/09/16 2026    Lab Results:  Results for orders placed or performed during the hospital encounter of 03/19/16 (from the past 48 hour(s))  Glucose, capillary     Status: Abnormal   Collection Time: 04/08/16  5:04 PM  Result Value Ref Range   Glucose-Capillary 106 (H) 65 - 99 mg/dL   Comment 1 Notify RN   Glucose, capillary     Status: Abnormal   Collection Time: 04/08/16  8:05 PM  Result Value Ref Range   Glucose-Capillary 182 (H) 65 - 99 mg/dL  Glucose, capillary     Status: Abnormal   Collection Time: 04/09/16  6:12 AM  Result Value Ref Range   Glucose-Capillary 135 (H) 65 - 99 mg/dL  Glucose, capillary     Status: None   Collection Time: 04/09/16 12:05 PM  Result Value Ref Range   Glucose-Capillary 91 65 - 99 mg/dL  Glucose, capillary     Status: Abnormal   Collection Time: 04/09/16  5:12 PM  Result Value Ref Range   Glucose-Capillary 138 (H) 65 - 99 mg/dL  Glucose, capillary     Status: Abnormal   Collection Time: 04/09/16  9:15 PM  Result Value Ref Range   Glucose-Capillary 141 (H) 65 - 99 mg/dL  Glucose, capillary     Status: Abnormal   Collection Time: 04/10/16  5:36 AM  Result Value Ref Range   Glucose-Capillary 142 (H) 65 - 99 mg/dL  Glucose, capillary     Status: None   Collection Time: 04/10/16 11:34 AM  Result Value Ref Range   Glucose-Capillary 86 65 - 99 mg/dL    Blood Alcohol level:  Lab Results  Component Value Date   ETH <5 03/18/2016   ETH <5 03/01/2016    Metabolic Disorder Labs: Lab Results  Component Value Date   HGBA1C 8.0 (H) 03/23/2016   MPG 183 03/23/2016   MPG 183 03/20/2016   Lab Results  Component Value Date   PROLACTIN 31.5 (H) 03/23/2016   PROLACTIN 36.6 (H) 03/20/2016   Lab Results  Component Value Date   CHOL 145 03/23/2016   TRIG 144 03/23/2016   HDL 30 (L) 03/23/2016   CHOLHDL 4.8 03/23/2016   VLDL 29 03/23/2016   LDLCALC 86  03/23/2016   LDLCALC 21 03/20/2016    Physical Findings: AIMS: Facial and Oral Movements Muscles of Facial Expression: None, normal Lips and Perioral Area: None, normal Jaw: None, normal Tongue: None, normal,Extremity Movements Upper (arms, wrists, hands, fingers): None, normal Lower (legs, knees, ankles, toes): None, normal,  Trunk Movements Neck, shoulders, hips: None, normal, Overall Severity Severity of abnormal movements (highest score from questions above): None, normal Incapacitation due to abnormal movements: None, normal Patient's awareness of abnormal movements (rate only patient's report): No Awareness, Dental Status Current problems with teeth and/or dentures?: No Does patient usually wear dentures?: No  CIWA:  CIWA-Ar Total: 1 COWS:  COWS Total Score: 3  Musculoskeletal: Strength & Muscle Tone: within normal limits Gait & Station: normal Patient leans: N/A   Psychiatric Specialty Exam: Physical Exam  Nursing note and vitals reviewed. Psychiatric: His mood appears anxious. His affect is labile. He is slowed. Thought content is paranoid and delusional. Cognition and memory are impaired. He is inattentive.    Review of Systems  Psychiatric/Behavioral: Positive for hallucinations. The patient is nervous/anxious.   All other systems reviewed and are negative.   Blood pressure 126/75, pulse 96, temperature 97.6 F (36.4 C), temperature source Oral, resp. rate 18, height 5' 5.5" (1.664 m), weight 91.6 kg (202 lb), SpO2 100 %.Body mass index is 33.1 kg/m.  General Appearance: Fairly Groomed  Eye Contact:  Fair  Speech:  Pressured   Volume:  Normal  Mood:  Anxious, Dysphoric and Irritable periodic through out the day  Affect:  Inappropriate and Labile smiles to self  Thought Process:  Disorganized, Irrelevant and Descriptions of Associations: Loose   Orientation:  Other:  person, place  Thought Content:  Delusions, Paranoid Ideation and Tangential   Suicidal  Thoughts:  No continues to be delusional and disorganized  Homicidal Thoughts:  No  Memory:  Immediate;   Poor Recent;   Poor Remote;   Poor  Judgement:  Impaired  Insight:  Shallow  Psychomotor Activity:  Restlessness  Concentration:  Concentration: Poor and Attention Span: Poor  Recall:  Fair  Fund of Knowledge:  Fair  Language:  Fair  Akathisia:  No  Handed:  Right  AIMS (if indicated):     Assets:  Desire for Improvement  ADL's:  Intact  Cognition:  WNL  Sleep:  Number of Hours: 3.25   03/31/16 CSW was able to obtain collateral information from his community support - who reported patient's baseline a few months ago was much better , he does have chronic delusions , but he was abel to take care of self and participate better in decision making discussions .   Treatment Plan Summary: Ronald Roman is a 55 year old AA male with history ofparanoid schizophrenia, DM, HTN,who was found walking naked by neighbors, recommended by his case manager to be brought to the hospital for evaluation.  Patient today continues to be labile , delusional . Awaiting [facility] placement.   Paranoid schizophrenia (HCC) currently unstable.  Will continue today 04/10/16  plan as below except where it is noted.  Daily contact with patient to assess and evaluate symptoms and progress in treatment and Medication management   Will continue  Clozaril  12.5 mg po daily and clozaril  75 mg po qhs for psychosis. Reviewed CBC with diff - 04/06/16- ANC- 8.6 /WBC- 12.8  wnl.needs weekly CBC/ANC. Will continue  Depakote  ER to 1250 mg po qhs for mood lability , although  Depakote level  - 03/25/16- 72 ( therapeutic) ,  Depakote level on 03/30/16- I have reviewed depakote level  - 74- therapeutic. Will continue Restoril 15 mg po qhs for sleep. Will continue to monitor vitals ,medication compliance and treatment side effects while patient is here.  Reviewed labs: EKG - qtc - wnl,  TSH- wnl , hba1c- 8- dietician  consult once patient is more stable  ,  pl -31.5 ,  lipid panel HDL - low - diet control recommendations to be provided when patient is more stable. CSW will continue  working on disposition. Patient  referred to [facility] for [facility] level of care.Pt is on their wait list. Patient to participate in therapeutic milieu  Lindwood Qua, NP [facility] 04/10/2016, 2:23 PM   Agree with NP Progress Note as above

## 2016-04-10 NOTE — Progress Notes (Signed)
DAR NOTE: Patient presents with anxious affect and mood.  Denies pain, auditory and visual hallucinations.  Rates depression at 0, hopelessness at 0, and anxiety at 0.  Described energy level as hyper and concentration as good.  Maintained on routine safety checks.  Medications given as prescribed.  Support and encouragement offered as needed. Patient remained preoccupied with discharge.   Patient observed responding to internal stimuli.  Patient pacing on the unit.  No signs and symptoms of hypoglycemia noted.    Offered no complaint.

## 2016-04-10 NOTE — Progress Notes (Signed)
  D: Pt appeared to be more confused, labile and slightly demanding, today than previous days. Informed the Clinical research associatewriter that he had worked out with [employer] during the day. Pt believed that staff RN was giving him a ride home. Writer found it difficult to follow the pt in conversation. Pt has no other questions or concerns.   A:  Support and encouragement was offered. 15 min checks continued for safety.  R: Pt remains safe.

## 2016-04-11 LAB — GLUCOSE, CAPILLARY
GLUCOSE-CAPILLARY: 125 mg/dL — AB (ref 65–99)
GLUCOSE-CAPILLARY: 169 mg/dL — AB (ref 65–99)
Glucose-Capillary: 108 mg/dL — ABNORMAL HIGH (ref 65–99)
Glucose-Capillary: 96 mg/dL (ref 65–99)

## 2016-04-11 NOTE — Progress Notes (Signed)
D.  Pt initially naked in his room, states he has no clothes.  RN that was here last night and had a rapport with him spoke to him and was able to get Pt to redress and take his medication.  Pt then sat in dayroom and slept to the point of drooling on himself.  He was unable to be awoken so VS were taken with O2 sats and BP.  O2 sats were 100% and BP was 89/56.  Pt did awaken and was more alert around midnight but still did not appear awake enough to return to his bed.  A.  Will continue to monitor.  R.  Pt remains safe.

## 2016-04-11 NOTE — Progress Notes (Signed)
D.  Pt pleasant on approach, but pacing in hall.  Pt stated that he was trying to move around as much as he could so that he would stay in shape for work when he is discharged.  Pt states he is ready to be discharged, tired of being here.  Pt denies SI/HI/hallucinations at this time.  A.  Support and encouragement offered, medications given as ordered  R.  Pt remains safe on the unit.  Will continue to watch.

## 2016-04-11 NOTE — [facility] Group Notes (Signed)
Adult Psychoeducational Group Note  Date:  04/11/2016 Time:  1000-1030  Group Topic/Focus:  Healthy Support Systems & Check in  Patient invited to attend but was not present for any of the group.  Lindajo RoyalDaniel P Javona Bergevin 04/11/2016, 12:46 PM

## 2016-04-11 NOTE — [facility] Counselor (Signed)
CSW confirmed with [facility] at [phone] that [facility] at 12:55 PM on 04/11/2016   Carney Bernatherine C Harrill, LCSW

## 2016-04-11 NOTE — Progress Notes (Signed)
Nursing Note 04/11/2016 [id number]  Data Reports sleeping good.  Denies physical complaints and emotional concerns.  Affect blunted but appropriate. Denies HI, SI, AVH.  Patient very minimal during morning but in afternoon did come to desk a lot and insert self into conversations, making loose assoication comments then laughing afterwards as if he was making jokes.  Patient pleasant with no aggression.  At [facility]2PM patient came to desk asking if he needed insulin, redirected as it wasn't due until before dinner time.  Another patient asked him if he was diabetic to which he replied.  "I am not diabetic my kids are.  I am taking it for them."  Taking medicines when prompted, going to meals, not attending groups groups.  Observed pacing hallway intermittently, laughing inappropriately.  Action Spoke with patient 1:1, nurse offered support to patient throughout shift.  Continues to be monitored on 15 minute checks for safety.  Response Remains safe and pleasant on unit though disorganized.

## 2016-04-11 NOTE — [facility] Group Notes (Signed)
[facility] Group Notes:  (Clinical Social Work)  04/11/2016  11:00AM-12:00PM  Summary of Progress/Problems:  The main focus of today's process group was to listen to a variety of genres of music and to identify that different types of music provoke different responses.  The patient then was able to identify personally what was soothing for them, as well as energizing, as well as how patient can personally use this knowledge in sleep habits, with depression, and with other symptoms.  The patient came in and out of the room throughout group, refusing to stay when invited each time but one in which he stayed and danced in his seat.    Type of Therapy:  Music Therapy   Participation Level:  Minimal  Participation Quality:  Resistant  Affect:  Blunted and labile  Cognitive:  Disorganized  Insight:  None  Engagement in Therapy:  Poor  Modes of Intervention:   Activity, Exploration  Ambrose MantleMareida Grossman-Orr, LCSW 04/11/2016

## 2016-04-11 NOTE — Progress Notes (Signed)
[facility]  04/11/2016 11:59 AM Osvaldo AngstBarry D Richter  MRN:  [id number] Subjective: Patient remains intrusive.  More appropriate.  No c/o.  Objective: Osvaldo AngstBarry D Bruneau is a 55 year old AA male with history ofparanoid schizophrenia, DM, HTN,who was found walking naked by neighbors, recommended by his case manager to be brought to the hospital for evaluation.  Patient seen and chart reviewed.Discussed patient with treatment team.  Pt continues to need redirection on the unit. Pt has been taking his medications , denies ADRs. At the moment, is not disruptive to the milieu Pt with poor insight and continues to need treatment.   Principal Problem: Paranoid schizophrenia (HCC) Diagnosis:   Patient Active Problem List   Diagnosis Date Noted  . Acute psychosis [F23]   . Leukocytosis [D72.829] 02/06/2015  . Sleep apnea [G47.30] 02/05/2015  . AKI (acute kidney injury) (HCC) [N17.9] 02/04/2015  . Seizure disorder (HCC) [G40.909] 02/04/2015  . Acute encephalopathy [G93.40] 02/04/2015  . Diabetes mellitus without complication (HCC) [E11.9]   . Hyperlipidemia [E78.5]   . Hypertension [I10]   . Paranoid schizophrenia (HCC) [F20.0]    Total Time spent with patient: 25 minutes  Past Psychiatric History: Please see H&P.   Past Medical History:  Past Medical History:  Diagnosis Date  . Diabetes mellitus without complication (HCC)   . GERD (gastroesophageal reflux disease)   . Hyperlipidemia   . Hypertension   . Paranoid schizophrenia (HCC)   . Sleep apnea   . Uric acid nephrolithiasis    History reviewed. No pertinent surgical history. Family History: Patient denies Family Psychiatric  History: Pt denies Social History: Patient reports he lives in [place] , is single , has children who does not live with him , reports he gets food stamps . History  Alcohol Use No     History  Drug Use No    Social History   Social History  . Marital status: Single    Spouse name: N/A   . Number of children: N/A  . Years of education: N/A   Social History Main Topics  . Smoking status: Current Every Day Smoker    Packs/day: 1.00    Types: Cigarettes  . Smokeless tobacco: Never Used  . Alcohol use No  . Drug use: No  . Sexual activity: Not Currently   Other Topics Concern  . None   Social History Narrative  . None   Additional Social History:       Sleep: Fair  Appetite:  Fair  Current Medications: Current Facility-Administered Medications  Medication Dose Route Frequency Provider Last Rate Last Dose  . acetaminophen (TYLENOL) tablet 650 mg  650 mg Oral Q6H PRN Kristeen MansFran E Hobson, NP   650 mg at 03/21/16 0329  . amantadine (SYMMETREL) capsule 100 mg  100 mg Oral BID Laveda AbbeLaurie Britton Parks, NP   100 mg at 04/11/16 0827  . cloZAPine (CLOZARIL) tablet 12.5 mg  12.5 mg Oral Daily Saramma Eappen, MD   12.5 mg at 04/11/16 0827  . cloZAPine (CLOZARIL) tablet 75 mg  75 mg Oral QHS Jomarie LongsSaramma Eappen, MD   75 mg at 04/10/16 2056  . divalproex (DEPAKOTE ER) 24 hr tablet 1,250 mg  1,250 mg Oral QHS Jomarie LongsSaramma Eappen, MD   1,250 mg at 04/10/16 2056  . insulin aspart (novoLOG) injection 0-15 Units  0-15 Units Subcutaneous TID WC Jomarie LongsSaramma Eappen, MD   2 Units at 04/11/16 0700  . insulin aspart (novoLOG) injection 0-5 Units  0-5 Units Subcutaneous  QHS Jomarie Longs, MD   5 Units at 04/01/16 2158  . insulin aspart (novoLOG) injection 4 Units  4 Units Subcutaneous TID WC Jomarie Longs, MD   4 Units at 04/11/16 0700  . insulin glargine (LANTUS) injection 20 Units  20 Units Subcutaneous QHS Jomarie Longs, MD   20 Units at 04/10/16 2057  . lisinopril (PRINIVIL,ZESTRIL) tablet 10 mg  10 mg Oral Daily Kristeen Mans, NP   10 mg at 04/11/16 1610  . nicotine polacrilex (NICORETTE) gum 2 mg  2 mg Oral PRN Jomarie Longs, MD      . OLANZapine (ZYPREXA) tablet 10 mg  10 mg Oral BID PRN Jomarie Longs, MD   10 mg at 04/08/16 0839   Or  . OLANZapine (ZYPREXA) injection 10 mg  10 mg Intramuscular  BID PRN Jomarie Longs, MD      . temazepam (RESTORIL) capsule 15 mg  15 mg Oral QHS Jomarie Longs, MD   15 mg at 04/10/16 2101    Lab Results:  Results for orders placed or performed during the hospital encounter of 03/19/16 (from the past 48 hour(s))  Glucose, capillary     Status: None   Collection Time: 04/09/16 12:05 PM  Result Value Ref Range   Glucose-Capillary 91 65 - 99 mg/dL  Glucose, capillary     Status: Abnormal   Collection Time: 04/09/16  5:12 PM  Result Value Ref Range   Glucose-Capillary 138 (H) 65 - 99 mg/dL  Glucose, capillary     Status: Abnormal   Collection Time: 04/09/16  9:15 PM  Result Value Ref Range   Glucose-Capillary 141 (H) 65 - 99 mg/dL  Glucose, capillary     Status: Abnormal   Collection Time: 04/10/16  5:36 AM  Result Value Ref Range   Glucose-Capillary 142 (H) 65 - 99 mg/dL  Glucose, capillary     Status: None   Collection Time: 04/10/16 11:34 AM  Result Value Ref Range   Glucose-Capillary 86 65 - 99 mg/dL  Glucose, capillary     Status: Abnormal   Collection Time: 04/10/16  4:48 PM  Result Value Ref Range   Glucose-Capillary 116 (H) 65 - 99 mg/dL  Glucose, capillary     Status: Abnormal   Collection Time: 04/10/16  8:52 PM  Result Value Ref Range   Glucose-Capillary 108 (H) 65 - 99 mg/dL  Glucose, capillary     Status: Abnormal   Collection Time: 04/10/16 10:59 PM  Result Value Ref Range   Glucose-Capillary 151 (H) 65 - 99 mg/dL  Glucose, capillary     Status: Abnormal   Collection Time: 04/11/16  6:08 AM  Result Value Ref Range   Glucose-Capillary 125 (H) 65 - 99 mg/dL  Glucose, capillary     Status: Abnormal   Collection Time: 04/11/16 11:44 AM  Result Value Ref Range   Glucose-Capillary 108 (H) 65 - 99 mg/dL    Blood Alcohol level:  Lab Results  Component Value Date   ETH <5 03/18/2016   ETH <5 03/01/2016    Metabolic Disorder Labs: Lab Results  Component Value Date   HGBA1C 8.0 (H) 03/23/2016   MPG 183 03/23/2016    MPG 183 03/20/2016   Lab Results  Component Value Date   PROLACTIN 31.5 (H) 03/23/2016   PROLACTIN 36.6 (H) 03/20/2016   Lab Results  Component Value Date   CHOL 145 03/23/2016   TRIG 144 03/23/2016   HDL 30 (L) 03/23/2016   CHOLHDL 4.8 03/23/2016  VLDL 29 03/23/2016   LDLCALC 86 03/23/2016   LDLCALC 21 03/20/2016    Physical Findings: AIMS: Facial and Oral Movements Muscles of Facial Expression: None, normal Lips and Perioral Area: None, normal Jaw: None, normal Tongue: None, normal,Extremity Movements Upper (arms, wrists, hands, fingers): None, normal Lower (legs, knees, ankles, toes): None, normal, Trunk Movements Neck, shoulders, hips: None, normal, Overall Severity Severity of abnormal movements (highest score from questions above): None, normal Incapacitation due to abnormal movements: None, normal Patient's awareness of abnormal movements (rate only patient's report): No Awareness, Dental Status Current problems with teeth and/or dentures?: No Does patient usually wear dentures?: No  CIWA:  CIWA-Ar Total: 1 COWS:  COWS Total Score: 3  Musculoskeletal: Strength & Muscle Tone: within normal limits Gait & Station: normal Patient leans: N/A   Psychiatric Specialty Exam: Physical Exam  Nursing note and vitals reviewed. Psychiatric: His mood appears anxious. His affect is labile. He is slowed. Thought content is paranoid and delusional. Cognition and memory are impaired. He is inattentive.    Review of Systems  Psychiatric/Behavioral: Positive for hallucinations. The patient is nervous/anxious.   All other systems reviewed and are negative.   Blood pressure 132/67, pulse 95, temperature 97.7 F (36.5 C), temperature source Oral, resp. rate 18, height 5' 5.5" (1.664 m), weight 91.6 kg (202 lb), SpO2 100 %.Body mass index is 33.1 kg/m.  General Appearance: Fairly Groomed  Eye Contact:  Fair  Speech:  Pressured   Volume:  Normal  Mood:  Anxious, Dysphoric and  Irritable periodic through out the day  Affect:  Inappropriate and Labile smiles to self  Thought Process:  Disorganized, Irrelevant and Descriptions of Associations: Loose   Orientation:  Other:  person, place  Thought Content:  Delusions, Paranoid Ideation and Tangential   Suicidal Thoughts:  No continues to be delusional and disorganized  Homicidal Thoughts:  No  Memory:  Immediate;   Poor Recent;   Poor Remote;   Poor  Judgement:  Impaired  Insight:  Shallow  Psychomotor Activity:  Restlessness  Concentration:  Concentration: Poor and Attention Span: Poor  Recall:  FiservFair  Fund of Knowledge:  Fair  Language:  Fair  Akathisia:  No  Handed:  Right  AIMS (if indicated):     Assets:  Desire for Improvement  ADL's:  Intact  Cognition:  WNL  Sleep:  Number of Hours: 4.5   03/31/16 CSW was able to obtain collateral information from his community support - who reported patient's baseline a few months ago was much better , he does have chronic delusions , but he was abel to take care of self and participate better in decision making discussions .   Treatment Plan Summary: Osvaldo AngstBarry D Dewoody is a 55 year old AA male with history ofparanoid schizophrenia, DM, HTN,who was found walking naked by neighbors, recommended by his case manager to be brought to the hospital for evaluation.  Patient today continues to be labile , delusional . Awaiting [facility] placement.   Paranoid schizophrenia (HCC) currently unstable.  Will continue today 04/11/16  plan as below except where it is noted.  Daily contact with patient to assess and evaluate symptoms and progress in treatment and Medication management   Will continue  Clozaril  12.5 mg po daily and clozaril  75 mg po qhs for psychosis. Reviewed CBC with diff - 04/06/16- ANC- 8.6 /WBC- 12.8  wnl.needs weekly CBC/ANC. Will continue  Depakote  ER to 1250 mg po qhs for mood lability , although  Depakote level  - 03/25/16- 72 ( therapeutic) ,  Depakote  level on 03/30/16- I have reviewed depakote level  - 74- therapeutic. Will continue Restoril 15 mg po qhs for sleep. Will continue to monitor vitals ,medication compliance and treatment side effects while patient is here.  Reviewed labs: EKG - qtc - wnl, TSH- wnl , hba1c- 8- dietician consult once patient is more stable  ,  pl -31.5 ,  lipid panel HDL - low - diet control recommendations to be provided when patient is more stable. CSW will continue  working on disposition. Patient  referred to [facility] for further level of care.Pt is on their wait list. Patient to participate in therapeutic milieu  Lindwood Qua, NP [facility] 04/11/2016, 11:59 AM   Agree with NP Progress Note as above

## 2016-04-11 NOTE — Progress Notes (Signed)
Pt did not attend wrap up group this evening.  

## 2016-04-12 LAB — GLUCOSE, CAPILLARY
GLUCOSE-CAPILLARY: 122 mg/dL — AB (ref 65–99)
GLUCOSE-CAPILLARY: 101 mg/dL — AB (ref 65–99)
Glucose-Capillary: 136 mg/dL — ABNORMAL HIGH (ref 65–99)
Glucose-Capillary: 86 mg/dL (ref 65–99)

## 2016-04-12 MED ORDER — CLOZAPINE 100 MG PO TABS
100.0000 mg | ORAL_TABLET | Freq: Every day | ORAL | Status: DC
  Administered 2016-04-12 – 2016-04-13 (×2): 100 mg via ORAL
  Filled 2016-04-12 (×3): qty 1

## 2016-04-12 NOTE — Plan of Care (Signed)
Problem: Safety: Goal: Ability to redirect hostility and anger into socially appropriate behaviors will improve Outcome: Progressing Pt has remained safe on the unit despite disagreement with peers earlier today

## 2016-04-12 NOTE — Progress Notes (Signed)
Patient ID: Ronald AngstBarry D Roman, male   DOB: 09-Apr-1961, 55 y.o.   MRN: [id number] D: Client visible on the unit, "I'm going home, she said I could go home, she coming back to let me go" "she be here with my lawyer, with my money" "I ain't hearing no voice, I ain't going to hurt myself" A: Writer provide emotional support encouraged client to speak to physician in the morning about discharge plans. Writer asked client about date, he proceeded to look at the assignment board and answered, "It's November 19 th, Monday" Medications reviewed, administered as ordered. Client took a shower tonight and put on clean clothes. Staff will monitor q8915min for safety. R: Client is safe on the unit.

## 2016-04-12 NOTE — Progress Notes (Signed)
Did not attend groups 

## 2016-04-12 NOTE — [facility] Group Notes (Signed)
[facility] LCSW Group Therapy  04/12/2016 1:15 pm  Type of Therapy: Process Group Therapy  Participation Level:  Active  Participation Quality:  Appropriate  Affect:  Flat  Cognitive:  Oriented  Insight:  Improving  Engagement in Group:  Limited  Engagement in Therapy:  Limited  Modes of Intervention:  Activity, Clarification, Education, Problem-solving and Support  Summary of Progress/Problems: Today's group addressed the issue of overcoming obstacles.  Patients were asked to identify their biggest obstacle post d/c that stands in the way of their on-going success, and then problem solve as to how to manage this. Invited.  Chose to not attend.  Ida Rogueorth, Nubia Ziesmer B 04/12/2016   3:36 PM

## 2016-04-12 NOTE — Progress Notes (Signed)
[facility]  04/12/2016 12:55 PM Ronald Roman  MRN:  [id number] Subjective: Patient reports " I am tired.I think I have too much medications.'        Objective: Ronald Roman is a 55 year old AA male with history ofparanoid schizophrenia, DM, HTN,who was found walking naked by neighbors, recommended by his case manager to be brought to the hospital for evaluation.  Patient seen and chart reviewed.Discussed patient with treatment team.  Patient today seen in bed , avoids eye contact , is guarded. Pt continues to be delusional , disorganized and inappropriate often. He does report some tiredness this AM - will readjust his medications to address this . [facility]          Principal Problem: Paranoid schizophrenia (HCC) Diagnosis:   Patient Active Problem List   Diagnosis Date Noted  . Acute psychosis [F23]   . Leukocytosis [D72.829] 02/06/2015  . Sleep apnea [G47.30] 02/05/2015  . AKI (acute kidney injury) (HCC) [N17.9] 02/04/2015  . Seizure disorder (HCC) [G40.909] 02/04/2015  . Acute encephalopathy [G93.40] 02/04/2015  . Diabetes mellitus without complication (HCC) [E11.9]   . Hyperlipidemia [E78.5]   . Hypertension [I10]   . Paranoid schizophrenia (HCC) [F20.0]    Total Time spent with patient: 25 minutes  Past Psychiatric History: Please see H&P.   Past Medical History:  Past Medical History:  Diagnosis Date  . Diabetes mellitus without complication (HCC)   . GERD (gastroesophageal reflux disease)   . Hyperlipidemia   . Hypertension   . Paranoid schizophrenia (HCC)   . Sleep apnea   . Uric acid nephrolithiasis    History reviewed. No pertinent surgical history. Family History: Patient denies Family Psychiatric  History: Pt denies Social History: Patient reports he lives in [place] , is single , has children who does not live with him , reports he gets food stamps . History   Alcohol Use No     History  Drug Use No    Social History   Social History  . Marital status: Single    Spouse name: N/A  . Number of children: N/A  . Years of education: N/A   Social History Main Topics  . Smoking status: Current Every Day Smoker    Packs/day: 1.00    Types: Cigarettes  . Smokeless tobacco: Never Used  . Alcohol use No  . Drug use: No  . Sexual activity: Not Currently   Other Topics Concern  . None   Social History Narrative  . None   Additional Social History:                         Sleep: Fair  Appetite:  Fair  Current Medications: Current Facility-Administered Medications  Medication Dose Route Frequency Provider Last Rate Last Dose  . acetaminophen (TYLENOL) tablet 650 mg  650 mg Oral Q6H PRN Kristeen MansFran E Hobson, NP   650 mg at 03/21/16 0329  . amantadine (SYMMETREL) capsule 100 mg  100 mg Oral BID Laveda AbbeLaurie Britton Parks, NP   100 mg at 04/12/16 0847  . cloZAPine (CLOZARIL) tablet 100 mg  100 mg Oral QHS Labrenda Lasky, MD      . cloZAPine (CLOZARIL) tablet 12.5 mg  12.5 mg Oral Daily Nyquan Selbe, MD   12.5 mg at 04/12/16 0846  . divalproex (DEPAKOTE ER) 24 hr tablet 1,250 mg  1,250 mg  Oral QHS Jomarie Longs, MD   1,250 mg at 04/11/16 2109  . insulin aspart (novoLOG) injection 0-15 Units  0-15 Units Subcutaneous TID WC Jomarie Longs, MD   0 Units at 04/12/16 1156  . insulin aspart (novoLOG) injection 0-5 Units  0-5 Units Subcutaneous QHS Jomarie Longs, MD   5 Units at 04/01/16 2158  . insulin aspart (novoLOG) injection 4 Units  4 Units Subcutaneous TID WC Jomarie Longs, MD   4 Units at 04/12/16 1157  . insulin glargine (LANTUS) injection 20 Units  20 Units Subcutaneous QHS Jomarie Longs, MD   20 Units at 04/11/16 2113  . lisinopril (PRINIVIL,ZESTRIL) tablet 10 mg  10 mg Oral Daily Kristeen Mans, NP   10 mg at 04/12/16 0846  . nicotine polacrilex (NICORETTE) gum 2 mg  2 mg Oral PRN Jomarie Longs, MD   2 mg at 04/11/16 1816  .  OLANZapine (ZYPREXA) tablet 10 mg  10 mg Oral BID PRN Jomarie Longs, MD   10 mg at 04/08/16 0839   Or  . OLANZapine (ZYPREXA) injection 10 mg  10 mg Intramuscular BID PRN Jomarie Longs, MD      . temazepam (RESTORIL) capsule 15 mg  15 mg Oral QHS Jomarie Longs, MD   15 mg at 04/11/16 2109    Lab Results:  Results for orders placed or performed during the hospital encounter of 03/19/16 (from the past 48 hour(s))  Glucose, capillary     Status: Abnormal   Collection Time: 04/10/16  4:48 PM  Result Value Ref Range   Glucose-Capillary 116 (H) 65 - 99 mg/dL  Glucose, capillary     Status: Abnormal   Collection Time: 04/10/16  8:52 PM  Result Value Ref Range   Glucose-Capillary 108 (H) 65 - 99 mg/dL  Glucose, capillary     Status: Abnormal   Collection Time: 04/10/16 10:59 PM  Result Value Ref Range   Glucose-Capillary 151 (H) 65 - 99 mg/dL  Glucose, capillary     Status: Abnormal   Collection Time: 04/11/16  6:08 AM  Result Value Ref Range   Glucose-Capillary 125 (H) 65 - 99 mg/dL  Glucose, capillary     Status: Abnormal   Collection Time: 04/11/16 11:44 AM  Result Value Ref Range   Glucose-Capillary 108 (H) 65 - 99 mg/dL  Glucose, capillary     Status: Abnormal   Collection Time: 04/11/16  5:25 PM  Result Value Ref Range   Glucose-Capillary 169 (H) 65 - 99 mg/dL  Glucose, capillary     Status: None   Collection Time: 04/11/16  8:58 PM  Result Value Ref Range   Glucose-Capillary 96 65 - 99 mg/dL  Glucose, capillary     Status: Abnormal   Collection Time: 04/12/16  5:27 AM  Result Value Ref Range   Glucose-Capillary 136 (H) 65 - 99 mg/dL  Glucose, capillary     Status: None   Collection Time: 04/12/16 11:44 AM  Result Value Ref Range   Glucose-Capillary 86 65 - 99 mg/dL   Comment 1 Notify RN    Comment 2 Document in Chart     Blood Alcohol level:  Lab Results  Component Value Date   ETH <5 03/18/2016   ETH <5 03/01/2016    Metabolic Disorder Labs: Lab Results   Component Value Date   HGBA1C 8.0 (H) 03/23/2016   MPG 183 03/23/2016   MPG 183 03/20/2016   Lab Results  Component Value Date   PROLACTIN 31.5 (H) 03/23/2016  PROLACTIN 36.6 (H) 03/20/2016   Lab Results  Component Value Date   CHOL 145 03/23/2016   TRIG 144 03/23/2016   HDL 30 (L) 03/23/2016   CHOLHDL 4.8 03/23/2016   VLDL 29 03/23/2016   LDLCALC 86 03/23/2016   LDLCALC 21 03/20/2016    Physical Findings: AIMS: Facial and Oral Movements Muscles of Facial Expression: None, normal Lips and Perioral Area: None, normal Jaw: None, normal Tongue: None, normal,Extremity Movements Upper (arms, wrists, hands, fingers): None, normal Lower (legs, knees, ankles, toes): None, normal, Trunk Movements Neck, shoulders, hips: None, normal, Overall Severity Severity of abnormal movements (highest score from questions above): None, normal Incapacitation due to abnormal movements: None, normal Patient's awareness of abnormal movements (rate only patient's report): No Awareness, Dental Status Current problems with teeth and/or dentures?: No Does patient usually wear dentures?: No  CIWA:  CIWA-Ar Total: 1 COWS:  COWS Total Score: 3  Musculoskeletal: Strength & Muscle Tone: within normal limits Gait & Station: normal Patient leans: N/A   Psychiatric Specialty Exam: Physical Exam  Nursing note and vitals reviewed.   Review of Systems  Constitutional: Positive for malaise/fatigue.  Psychiatric/Behavioral: Positive for hallucinations. The patient is nervous/anxious and has insomnia.   All other systems reviewed and are negative.   Blood pressure 134/88, pulse (!) 102, temperature 98.3 F (36.8 C), temperature source Oral, resp. rate 18, height 5' 5.5" (1.664 m), weight 91.6 kg (202 lb), SpO2 100 %.Body mass index is 33.1 kg/m.  General Appearance: Guarded  Eye Contact:  None  Speech:  Slow   Volume:  Decreased  Mood:  Anxious, Dysphoric and Irritable   Affect:  Inappropriate and  Labile smiles to self often  Thought Process:  Disorganized, Irrelevant and Descriptions of Associations: Loose   Orientation:  Full (Time, Place, and Person)  Thought Content:  Delusions, Paranoid Ideation and Tangential   Suicidal Thoughts:  No continues to be delusional and disorganized  Homicidal Thoughts:  No  Memory:  Immediate;   Poor Recent;   Poor Remote;   Poor  Judgement:  Impaired  Insight:  Shallow  Psychomotor Activity:  Restlessness  Concentration:  Concentration: Poor and Attention Span: Poor  Recall:  Fair  Fund of Knowledge:  Fair  Language:  Fair  Akathisia:  No  Handed:  Right  AIMS (if indicated):     Assets:  Desire for Improvement  ADL's:  Intact  Cognition:  WNL  Sleep:  Number of Hours: 5   03/31/16 CSW was able to obtain collateral information from his community support - who reported patient's baseline a few months ago was much better , he does have chronic delusions , but he was abel to take care of self and participate better in decision making discussions .        Treatment Plan Summary: Ronald Roman is a 55 year old AA male with history ofparanoid schizophrenia, DM, HTN,who was found walking naked by neighbors, recommended by his case manager to be brought to the hospital for evaluation.  Patient today is guarded , with C/O malaise , continues to be delusional. Awaiting [facility] placement.    Paranoid schizophrenia (HCC) currently unstable.  Will continue today 04/12/16  plan as below except where it is noted.  Daily contact with patient to assess and evaluate symptoms and progress in treatment and Medication management    Will continue  Clozaril  12.5 mg po daily and increase clozaril to 100 mg po qhs for psychosis. Reviewed CBC with diff -  04/06/16- ANC- 8.6 /WBC- 12.8  wnl.needs weekly CBC/ANC- next one due tomorrow 04/13/16. Will reduce Depakote  ER to 1000 mg po qhs for mood lability , although  Depakote level  - 03/25/16- 72 (  therapeutic) ,  depakote level on 03/30/16- I have reviewed depakote level  - 74- therapeutic.Dose reduced to address his tiredness. Will get another depakote level on 04/16/16. Will continue Restoril 15 mg po qhs for sleep. Will continue to monitor vitals ,medication compliance and treatment side effects while patient is here.  Reviewed labs: EKG - qtc - wnl, TSH- wnl , hba1c- 8- dietician consult once patient is more stable  ,  pl -31.5 ,  lipid panel HDL - low - diet control recommendations to be provided when patient is more stable. CSW will continue  working on disposition. Patient  referred to [facility] for further level of care.Pt is on their wait list. Patient to participate in therapeutic milieu Tiyanna Larcom, MD 04/12/2016, 12:55 PM

## 2016-04-12 NOTE — Progress Notes (Signed)
Recreation Therapy Notes  Date: 04/12/16 Time: 1000 Location: [address] Dayroom  Group Topic: Self-Esteem  Goal Area(s) Addresses:  Patient will identify positive ways to increase self-esteem. Patient will verbalize benefit of increased self-esteem.  Intervention: Worksheets with blank faces, markers, colored pencils  Activity: How I See Me.  LRT introduced the concept of self-esteem and why it's important.  Patients were given a worksheet with a blank face on it.  Patients were asked to draw or use words to describe how they see themselves on the blank face.  Patients were then asked to draw/write how other people see them on the back of the worksheet.  Education:  Self-Esteem, Building control surveyorDischarge Planning.   Education Outcome: Acknowledges education/In group clarification offered/Needs additional education  Clinical Observations/Feedback: Pt did not attend group.    Caroll RancherMarjette Nashley [place], LRT/CTRS         Caroll RancherLindsay, Melinda Pottinger A 04/12/2016 12:42 PM

## 2016-04-12 NOTE — Progress Notes (Signed)
Patient has been pacing the hall, patient continues to remain delusion stating he is the king of the world and [place].  Patient has has had no issues of behavioral dsycontol  Assess patient for safety, continue to monitor as prescribed. Engage patient in 1:1 staff talks.   Continue to monitor as prescribed.

## 2016-04-13 LAB — CBC WITH DIFFERENTIAL/PLATELET
BASOS ABS: 0 10*3/uL (ref 0.0–0.1)
BASOS PCT: 0 %
Eosinophils Absolute: 0.2 10*3/uL (ref 0.0–0.7)
Eosinophils Relative: 2 %
HEMATOCRIT: 37.2 % — AB (ref 39.0–52.0)
HEMOGLOBIN: 12.3 g/dL — AB (ref 13.0–17.0)
Lymphocytes Relative: 25 %
Lymphs Abs: 3.1 10*3/uL (ref 0.7–4.0)
MCH: 26.5 pg (ref 26.0–34.0)
MCHC: 33.1 g/dL (ref 30.0–36.0)
MCV: 80 fL (ref 78.0–100.0)
Monocytes Absolute: 1 10*3/uL (ref 0.1–1.0)
Monocytes Relative: 8 %
NEUTROS ABS: 8 10*3/uL — AB (ref 1.7–7.7)
NEUTROS PCT: 65 %
Platelets: 319 10*3/uL (ref 150–400)
RBC: 4.65 MIL/uL (ref 4.22–5.81)
RDW: 14.3 % (ref 11.5–15.5)
WBC: 12.2 10*3/uL — ABNORMAL HIGH (ref 4.0–10.5)

## 2016-04-13 LAB — GLUCOSE, CAPILLARY
GLUCOSE-CAPILLARY: 123 mg/dL — AB (ref 65–99)
GLUCOSE-CAPILLARY: 84 mg/dL (ref 65–99)
GLUCOSE-CAPILLARY: 144 mg/dL — AB (ref 65–99)
GLUCOSE-CAPILLARY: 144 mg/dL — AB (ref 65–99)

## 2016-04-13 NOTE — [facility] Group Notes (Signed)
Pt attended group but was unresponsive.

## 2016-04-13 NOTE — Plan of Care (Signed)
Problem: Education: Goal: Will be free of psychotic symptoms Outcome: Progressing Client will be free of psychotic symptoms AEB denial of AVH "I don't hear no voices, I see nothing"

## 2016-04-13 NOTE — [facility] Group Notes (Signed)
[facility] LCSW Group Therapy  04/13/2016 , 4:50 PM   Type of Therapy:  Group Therapy  Participation Level:  Active  Participation Quality:  Attentive  Affect:  Appropriate  Cognitive:  Alert  Insight:  Improving  Engagement in Therapy:  Engaged  Modes of Intervention:  Discussion, Exploration and Socialization  Summary of Progress/Problems: Today's group focused on the term Diagnosis.  Participants were asked to define the term, and then pronounce whether it is a negative, positive or neutral term. Invited.  Chose to not attend.  Daryel Geraldorth, Terrianna Holsclaw B 04/13/2016 , 4:50 PM

## 2016-04-13 NOTE — Progress Notes (Signed)
[facility]  04/13/2016 1:53 PM Ronald AngstBarry D Kelm  MRN:  [id number] Subjective: Patient reports " I am fine , I had a shower last night , I put shampoo on myself.'     Objective: Ronald Roman is a 55 year old AA male with history ofparanoid schizophrenia, DM, HTN,who was found walking naked by neighbors, recommended by his case manager to be brought to the hospital for evaluation.  Patient seen and chart reviewed.Discussed patient with treatment team.  Patient today seen awake and alert . Pt is more cooperative today , however he continues to be delusional. Pt per staff - continues to be grandiose , making irrelevant statements , and goes off topic when spoken to. Pt this AM also has loose thought process, however he is more appropriate than yesterday. Per staff he refused his clozaril labs this AM - will re order. Pt continues to be on [facility] waitlist.          Principal Problem: Paranoid schizophrenia (HCC) Diagnosis:   Patient Active Problem List   Diagnosis Date Noted  . Acute psychosis [F23]   . Leukocytosis [D72.829] 02/06/2015  . Sleep apnea [G47.30] 02/05/2015  . AKI (acute kidney injury) (HCC) [N17.9] 02/04/2015  . Seizure disorder (HCC) [G40.909] 02/04/2015  . Acute encephalopathy [G93.40] 02/04/2015  . Diabetes mellitus without complication (HCC) [E11.9]   . Hyperlipidemia [E78.5]   . Hypertension [I10]   . Paranoid schizophrenia (HCC) [F20.0]    Total Time spent with patient: 25 minutes  Past Psychiatric History: Please see H&P.   Past Medical History:  Past Medical History:  Diagnosis Date  . Diabetes mellitus without complication (HCC)   . GERD (gastroesophageal reflux disease)   . Hyperlipidemia   . Hypertension   . Paranoid schizophrenia (HCC)   . Sleep apnea   . Uric acid nephrolithiasis    History reviewed. No pertinent surgical history. Family History: Patient denies Family Psychiatric  History: Pt denies Social History: Patient  [place] , is single , has children who does not live with him , reports he gets food stamps . History  Alcohol Use No     History  Drug Use No    Social History   Social History  . Marital status: Single    Spouse name: N/A  . Number of children: N/A  . Years of education: N/A   Social History Main Topics  . Smoking status: Current Every Day Smoker    Packs/day: 1.00    Types: Cigarettes  . Smokeless tobacco: Never Used  . Alcohol use No  . Drug use: No  . Sexual activity: Not Currently   Other Topics Concern  . None   Social History Narrative  . None   Additional Social History:                         Sleep: Fair  Appetite:  Fair  Current Medications: Current Facility-Administered Medications  Medication Dose Route Frequency Provider Last Rate Last Dose  . acetaminophen (TYLENOL) tablet 650 mg  650 mg Oral Q6H PRN Kristeen MansFran E Hobson, NP   650 mg at 03/21/16 0329  . amantadine (SYMMETREL) capsule 100 mg  100 mg Oral BID Laveda AbbeLaurie Britton Parks, NP   100 mg at 04/13/16 0754  . cloZAPine (CLOZARIL) tablet 100 mg  100 mg Oral QHS Jomarie LongsSaramma Kymberlyn Eckford, MD   100 mg at 04/12/16 2225  . cloZAPine (CLOZARIL) tablet 12.5  mg  12.5 mg Oral Daily Jomarie Longs, MD   12.5 mg at 04/13/16 0752  . divalproex (DEPAKOTE ER) 24 hr tablet 1,250 mg  1,250 mg Oral QHS Jomarie Longs, MD   1,250 mg at 04/12/16 2225  . insulin aspart (novoLOG) injection 0-15 Units  0-15 Units Subcutaneous TID WC Jomarie Longs, MD   0 Units at 04/13/16 1201  . insulin aspart (novoLOG) injection 0-5 Units  0-5 Units Subcutaneous QHS Jomarie Longs, MD   5 Units at 04/01/16 2158  . insulin aspart (novoLOG) injection 4 Units  4 Units Subcutaneous TID WC Jomarie Longs, MD   4 Units at 04/13/16 1202  . insulin glargine (LANTUS) injection 20 Units  20 Units Subcutaneous QHS Jomarie Longs, MD   20 Units at 04/12/16 2225  . lisinopril (PRINIVIL,ZESTRIL) tablet 10 mg  10 mg Oral Daily Kristeen Mans, NP   10 mg at 04/13/16 0755  . nicotine polacrilex (NICORETTE) gum 2 mg  2 mg Oral PRN Jomarie Longs, MD   2 mg at 04/11/16 1816  . OLANZapine (ZYPREXA) tablet 10 mg  10 mg Oral BID PRN Jomarie Longs, MD   10 mg at 04/08/16 0839   Or  . OLANZapine (ZYPREXA) injection 10 mg  10 mg Intramuscular BID PRN Jomarie Longs, MD      . temazepam (RESTORIL) capsule 15 mg  15 mg Oral QHS Jomarie Longs, MD   15 mg at 04/12/16 2225    Lab Results:  Results for orders placed or performed during the hospital encounter of 03/19/16 (from the past 48 hour(s))  Glucose, capillary     Status: Abnormal   Collection Time: 04/11/16  5:25 PM  Result Value Ref Range   Glucose-Capillary 169 (H) 65 - 99 mg/dL  Glucose, capillary     Status: None   Collection Time: 04/11/16  8:58 PM  Result Value Ref Range   Glucose-Capillary 96 65 - 99 mg/dL  Glucose, capillary     Status: Abnormal   Collection Time: 04/12/16  5:27 AM  Result Value Ref Range   Glucose-Capillary 136 (H) 65 - 99 mg/dL  Glucose, capillary     Status: None   Collection Time: 04/12/16 11:44 AM  Result Value Ref Range   Glucose-Capillary 86 65 - 99 mg/dL   Comment 1 Notify RN    Comment 2 Document in Chart   Glucose, capillary     Status: Abnormal   Collection Time: 04/12/16  4:23 PM  Result Value Ref Range   Glucose-Capillary 122 (H) 65 - 99 mg/dL   Comment 1 Notify RN    Comment 2 Document in Chart   Glucose, capillary     Status: Abnormal   Collection Time: 04/12/16  8:29 PM  Result Value Ref Range   Glucose-Capillary 101 (H) 65 - 99 mg/dL  Glucose, capillary     Status: Abnormal   Collection Time: 04/13/16  5:55 AM  Result Value Ref Range   Glucose-Capillary 123 (H) 65 - 99 mg/dL  Glucose, capillary     Status: None   Collection Time: 04/13/16 11:59 AM  Result Value Ref Range   Glucose-Capillary 84 65 - 99 mg/dL   Comment 1 Notify RN     Blood Alcohol level:  Lab Results  Component Value Date   ETH <5 03/18/2016    ETH <5 03/01/2016    Metabolic Disorder Labs: Lab Results  Component Value Date   HGBA1C 8.0 (H) 03/23/2016   MPG 183 03/23/2016  MPG 183 03/20/2016   Lab Results  Component Value Date   PROLACTIN 31.5 (H) 03/23/2016   PROLACTIN 36.6 (H) 03/20/2016   Lab Results  Component Value Date   CHOL 145 03/23/2016   TRIG 144 03/23/2016   HDL 30 (L) 03/23/2016   CHOLHDL 4.8 03/23/2016   VLDL 29 03/23/2016   LDLCALC 86 03/23/2016   LDLCALC 21 03/20/2016    Physical Findings: AIMS: Facial and Oral Movements Muscles of Facial Expression: None, normal Lips and Perioral Area: None, normal Jaw: None, normal Tongue: None, normal,Extremity Movements Upper (arms, wrists, hands, fingers): None, normal Lower (legs, knees, ankles, toes): None, normal, Trunk Movements Neck, shoulders, hips: None, normal, Overall Severity Severity of abnormal movements (highest score from questions above): None, normal Incapacitation due to abnormal movements: None, normal Patient's awareness of abnormal movements (rate only patient's report): No Awareness, Dental Status Current problems with teeth and/or dentures?: No Does patient usually wear dentures?: No  CIWA:  CIWA-Ar Total: 1 COWS:  COWS Total Score: 3  Musculoskeletal: Strength & Muscle Tone: within normal limits Gait & Station: normal Patient leans: N/A   Psychiatric Specialty Exam: Physical Exam  Nursing note and vitals reviewed.   Review of Systems  Psychiatric/Behavioral: The patient is nervous/anxious.   All other systems reviewed and are negative.   Blood pressure 138/88, pulse 100, temperature 98.4 F (36.9 C), temperature source Oral, resp. rate 18, height 5' 5.5" (1.664 m), weight 91.6 kg (202 lb), SpO2 100 %.Body mass index is 33.1 kg/m.  General Appearance: Guarded  Eye Contact:  Fair  Speech:  Normal Rate   Volume:  Normal  Mood:  Anxious, Dysphoric and Irritable   Affect:  Inappropriate and Labile   Thought Process:   Disorganized, Irrelevant and Descriptions of Associations: Loose   Orientation:  Full (Time, Place, and Person)  Thought Content:  Delusions, Paranoid Ideation and Tangential   Suicidal Thoughts:  No continues to be delusional and disorganized  Homicidal Thoughts:  No  Memory:  Immediate;   Poor Recent;   Poor Remote;   Poor  Judgement:  Impaired  Insight:  Shallow  Psychomotor Activity:  Restlessness  Concentration:  Concentration: Fair and Attention Span: Fair  Recall:  [employer] of Knowledge:  Fair  Language:  Fair  Akathisia:  No  Handed:  Right  AIMS (if indicated):     Assets:  Desire for Improvement  ADL's:  Intact  Cognition:  WNL  Sleep:  Number of Hours: 4   03/31/16 CSW was able to obtain collateral information from his community support - who reported patient's baseline a few months ago was much better , he does have chronic delusions , but he was abel to take care of self and participate better in decision making discussions .        Treatment Plan Summary: Ronald Roman is a 55 year old AA male with history ofparanoid schizophrenia, DM, HTN,who was found walking naked by neighbors, recommended by his case manager to be brought to the hospital for evaluation.  Patient today is irrelevant and delusional , less labile and irritable - refused labs this AM - will continue to encourage . Awaiting [facility] placement.    Paranoid schizophrenia (HCC) currently unstable.  Will continue today 04/13/16  plan as below except where it is noted.  Daily contact with patient to assess and evaluate symptoms and progress in treatment and Medication management    Will continue  Clozaril  12.5 mg po daily  and increase clozaril to 100 mg po qhs for psychosis. Reviewed CBC with diff - 04/06/16- ANC- 8.6 /WBC- 12.8  wnl.needs weekly CBC/ANC- 04/13/16- pt refused labs - will reorder. Reduced Depakote  ER to 1000 mg po qhs for mood lability , although  Depakote level  - 03/25/16- 72 (  therapeutic) ,  depakote level on 03/30/16- I have reviewed depakote level  - 74- therapeutic.Dose reduced to address his tiredness. Will get another depakote level on 04/16/16. Will continue Restoril 15 mg po qhs for sleep. Will continue to monitor vitals ,medication compliance and treatment side effects while patient is here.  Reviewed labs: EKG - qtc - wnl, TSH- wnl , hba1c- 8- dietician consult once patient is more stable  ,  pl -31.5 ,  lipid panel HDL - low - diet control recommendations to be provided when patient is more stable. CSW will continue  working on disposition. [facility]  [facility].Pt is on their wait list. Patient to participate in therapeutic milieu Ronald Hayashi, MD 04/13/2016, 1:53 PM

## 2016-04-13 NOTE — Tx Team (Signed)
Interdisciplinary Treatment and Diagnostic Plan Update  04/13/2016 Time of Session: 3:28 PM  BRENTT FREAD MRN: [id number]  Principal Diagnosis: Paranoid schizophrenia [facility])  Secondary Diagnoses: Principal Problem:   Paranoid schizophrenia (HCC)   Current Medications:  Current Facility-Administered Medications  Medication Dose Route Frequency Provider Last Rate Last Dose  . acetaminophen (TYLENOL) tablet 650 mg  650 mg Oral Q6H PRN Kristeen Mans, NP   650 mg at 03/21/16 0329  . amantadine (SYMMETREL) capsule 100 mg  100 mg Oral BID Laveda Abbe, NP   100 mg at 04/13/16 0754  . cloZAPine (CLOZARIL) tablet 100 mg  100 mg Oral QHS Jomarie Longs, MD   100 mg at 04/12/16 2225  . cloZAPine (CLOZARIL) tablet 12.5 mg  12.5 mg Oral Daily Jomarie Longs, MD   12.5 mg at 04/13/16 0752  . divalproex (DEPAKOTE ER) 24 hr tablet 1,250 mg  1,250 mg Oral QHS Jomarie Longs, MD   1,250 mg at 04/12/16 2225  . insulin aspart (novoLOG) injection 0-15 Units  0-15 Units Subcutaneous TID WC Jomarie Longs, MD   0 Units at 04/13/16 1201  . insulin aspart (novoLOG) injection 0-5 Units  0-5 Units Subcutaneous QHS Jomarie Longs, MD   5 Units at 04/01/16 2158  . insulin aspart (novoLOG) injection 4 Units  4 Units Subcutaneous TID WC Jomarie Longs, MD   4 Units at 04/13/16 1202  . insulin glargine (LANTUS) injection 20 Units  20 Units Subcutaneous QHS Jomarie Longs, MD   20 Units at 04/12/16 2225  . lisinopril (PRINIVIL,ZESTRIL) tablet 10 mg  10 mg Oral Daily Kristeen Mans, NP   10 mg at 04/13/16 0755  . nicotine polacrilex (NICORETTE) gum 2 mg  2 mg Oral PRN Jomarie Longs, MD   2 mg at 04/11/16 1816  . OLANZapine (ZYPREXA) tablet 10 mg  10 mg Oral BID PRN Jomarie Longs, MD   10 mg at 04/08/16 0839   Or  . OLANZapine (ZYPREXA) injection 10 mg  10 mg Intramuscular BID PRN Jomarie Longs, MD      . temazepam (RESTORIL) capsule 15 mg  15 mg Oral QHS Jomarie Longs, MD   15 mg at 04/12/16 2225    PTA  Medications: Prescriptions Prior to Admission  Medication Sig Dispense Refill Last Dose  . albuterol (PROVENTIL HFA;VENTOLIN HFA) 108 (90 BASE) MCG/ACT inhaler Inhale 2 puffs into the lungs every 4 (four) hours as needed for wheezing or shortness of breath. (Patient not taking: Reported on 03/19/2016) 1 Inhaler 0 Not Taking at Unknown time  . cetirizine (ZYRTEC) 10 MG chewable tablet Chew 1 tablet (10 mg total) by mouth daily. (Patient not taking: Reported on 03/19/2016) 20 tablet 1 Not Taking at Unknown time  . cloZAPine (CLOZARIL) 100 MG tablet Take 1 tablet (100 mg total) by mouth 2 (two) times daily. (Patient not taking: Reported on 03/19/2016) 60 tablet 0 Not Taking at Unknown time  . divalproex (DEPAKOTE) 250 MG DR tablet Take 1 tablet (250 mg total) by mouth at bedtime. (Patient not taking: Reported on 03/19/2016) 30 tablet 0 Not Taking at Unknown time  . divalproex (DEPAKOTE) 500 MG DR tablet Take 2 tablets (1,000 mg total) by mouth at bedtime. (Patient not taking: Reported on 03/19/2016) 60 tablet 0 Not Taking at Unknown time  . insulin glargine (LANTUS) 100 UNIT/ML injection Inject 10 Units into the skin at bedtime.   Not Taking at Unknown time  . lisinopril (PRINIVIL,ZESTRIL) 10 MG tablet Take 10 mg by mouth  daily.    Not Taking at Unknown time  . LORazepam (ATIVAN) 0.5 MG tablet Take 1 tablet (0.5 mg total) by mouth 2 (two) times daily. (Patient not taking: Reported on 03/19/2016) 60 tablet 0 Not Taking at Unknown time  . metoprolol tartrate (LOPRESSOR) 25 MG tablet Take 25 mg by mouth 2 (two) times daily.   Not Taking at Unknown time  . QUEtiapine (SEROQUEL) 100 MG tablet Take 1 tablet (100 mg total) by mouth at bedtime. (Patient not taking: Reported on 03/19/2016) 30 tablet 0 Not Taking at Unknown time  . QUEtiapine (SEROQUEL) 50 MG tablet Take 1 tablet (50 mg total) by mouth daily. (Patient not taking: Reported on 03/19/2016) 30 tablet 0 Not Taking at Unknown time  . zolpidem (AMBIEN) 5  MG tablet Take 1 tablet (5 mg total) by mouth at bedtime as needed for sleep. (Patient not taking: Reported on 03/19/2016) 30 tablet 0 Not Taking at Unknown time    Treatment Modalities: Medication Management, Group therapy, Case management,  1 to 1 session with clinician, Psychoeducation, Recreational therapy.   Physician Treatment Plan for Primary Diagnosis: Paranoid schizophrenia ([name]) Long Term Goal(s): Improvement in symptoms so as ready for discharge  Short Term Goals: Ability to identify changes in lifestyle to reduce recurrence of condition will improve  Medication Management: Evaluate patient's response, side effects, and tolerance of medication regimen.  Therapeutic Interventions: 1 to 1 sessions, Unit Group sessions and Medication administration.  Evaluation of Outcomes: Not Met  Physician Treatment Plan for Secondary Diagnosis: Principal Problem:   Paranoid schizophrenia ([name])   Long Term Goal(s): Improvement in symptoms so as ready for discharge  Short Term Goals: Ability to identify and develop effective coping behaviors will improve  Medication Management: Evaluate patient's response, side effects, and tolerance of medication regimen.  Therapeutic Interventions: 1 to 1 sessions, Unit Group sessions and Medication administration.  Evaluation of Outcomes: Not Met  11/2: Patient today seen as disorganized, delusional - will change his clozaril to [place] - given his inability to be compliant with labs as well as taking his medications , and given the fact that he is going to return to his independent living situation with case management available . Will discontinue  Clozaril for inability to be compliant with labs. Will start a trial of Invega 3 mg po qhs for psychosis. Will offer Invega sustenna IM prior to discharge. Pt agrees with plan. Will continue Depakote , change to ER 1000 mg po qhs for mood sx. Depakote level  - 03/25/16. Will discontinue Trazodone for lack of  efficacy. Start Doxepin 10 mg po qhs for sleep.  11/7:  Will continue Invega  12 mg po qhs for psychosis. Will offer Invega sustenna IM prior to discharge. Pt agrees with plan. Increased  Depakote  ER to 1250 mg po qhs for mood lability , although Depakote level - 03/25/16- 72 ( therapeutic) ,  depakote level on 03/30/16- I have reviewed depakote level today - 74- therapeutic. Will increase  Doxepin to 50 mg po qhs for sleep. Will continue Ativan  1 mg po qhs for anxiety   11/13: Patient today seen as minimizing his sx, continues to be grandiose , delusional , has sleep issues . Started on a second antipsychotic yesterday to augment the effect of Invega . Will reduce Invega to 9 mg po qhs for psychosis. Will continue Geodon 40 mg po bid with meals for augmenting the effect of Invega . Increased  Depakote  ER to 1250 mg  po qhs for mood lability , although Depakote level - 03/25/16- 72 ( therapeutic) ,  depakote level on 03/30/16- I have reviewed depakote level today - 74- therapeutic. Will discontinue  Doxepin for lack of efficacy. Start Elavil 25 mg po qhs for sleep. Will continue Ativan  1 mg po qhs for anxiety   11/16:Patient continues to be disorganized ,continues to have delusional thoughts , irrelevant on and off and continues to have  inappropriate laughter.  Will increase  Clozaril to 12.5 mg po daily and clozaril 50 mg po qhs for psychosis. Reviewed CBC with diff - 04/06/16- ANC- 8.6 /WBC- 12.8  wnl.needs weekly CBC/ANC. Will taper off  Geodon for lack of efficacy. Will continue  Depakote  ER to 1250 mg po qhs for mood lability , although Depakote level - 03/25/16- 72 ( therapeutic) ,  depakote level on 03/30/16- I have reviewed depakote level  - 74- therapeutic. Will continue Restoril 15 mg po qhs for sleep.  11/21: Today presents as irrelevant, delusional, refused AM meds  Will continue  Clozaril  12.5 mg po daily and increase clozaril to 100 mg po qhs for psychosis. Reviewed CBC  with diff - 04/06/16- ANC- 8.6 /WBC- 12.8  wnl.needs weekly CBC/ANC- 04/13/16- pt refused labs - will reorder. Reduced Depakote  ER to 1000 mg po qhs for mood lability , although Depakote level - 03/25/16- 72 ( therapeutic) ,  depakote level on 03/30/16- I have reviewed depakote level  - 74- therapeutic.Dose reduced to address his tiredness. Will get another depakote level on 04/16/16.  RN Treatment Plan for Primary Diagnosis: Paranoid schizophrenia ([place]) Long Term Goal(s): Knowledge of disease and therapeutic regimen to maintain health will improve  Short Term Goals: Compliance with prescribed medications will improve  Medication Management: RN will administer medications as ordered by provider, will assess and evaluate patient's response and provide education to patient for prescribed medication. RN will report any adverse and/or side effects to prescribing provider.  Therapeutic Interventions: 1 on 1 counseling sessions, Psychoeducation, Medication administration, Evaluate responses to treatment, Monitor vital signs and CBGs as ordered, Perform/monitor CIWA, COWS, AIMS and Fall Risk screenings as ordered, Perform wound care treatments as ordered.  Evaluation of Outcomes: Progressing   LCSW Treatment Plan for Primary Diagnosis: Paranoid schizophrenia ([place]) Long Term Goal(s): Safe transition to appropriate next level of care at discharge, Engage patient in therapeutic group addressing interpersonal concerns.  Short Term Goals: Engage patient in aftercare planning with referrals and resources  Therapeutic Interventions: Assess for all discharge needs, 1 to 1 time with Social worker, Explore available resources and support systems, Assess for adequacy in community support network, Educate family and significant other(s) on suicide prevention, Complete Psychosocial Assessment, Interpersonal group therapy.  Evaluation of Outcomes: Progressing  CSW left message for Melody Jefferey Pica  transition coordinator at [phone]. 11/2:  [facility] does not support referral to [facility].  However, they are requesting referral to [facility] as a woman with whom Doc had a good relationship with in the past has transferred to that agency.  As noted above, we are trying to switch him to injectable due to recent history of non-compliance with meds, and the fact that he will not be monitored daily. 11/7:  [facility] came to see patient.  Unable to complete assessment due to delusions, disorganization 11/13: Pt visited by [facility] peer support last week-they report he is far from baseline previous to first of three hospitalizations that began in October, and that he appears  incapable of living independently.  [facility] has agreed that if he does not progress under our care, they will support [facility] referral 11/16: Pt is on the [facility] wait list. CSW to begin PASSR process this week for placement despite the lack of progress addressing mental health symptoms 11/21: Pt was visited by Alphonzo [facility] manager.  Her feedback is that irrelevence and disorganization is not an issue, but inability to agree that he agrees to go to [facility] is an issue, and he insists that he will return to [employer] at d/c.  "I own the whole town. I can stay anywhere I want."  Progress in Treatment: Attending groups: No Participating in groups: No Taking medication as prescribed: Yes Toleration medication: Yes, no side effects reported at this time Family/Significant other contact made: Yes  See above Patient understands diagnosis: No  Limited insight Discussing patient identified problems/goals with staff: Yes Medical problems stabilized or resolved: Yes Denies suicidal/homicidal ideation: Yes Issues/concerns per patient self-inventory: None Other: N/A  New problem(s) identified: None identified at this time.   New Short Term/Long Term Goal(s): None identified at this time.   Discharge Plan or Barriers: See above  Reason for  Continuation of Hospitalization: Disorganization Medication stabilization Delusions   Estimated Length of Stay: 3-5 days  Attendees: Patient: 04/13/2016  3:28 PM  Physician: Ursula Alert, MD 04/13/2016  3:28 PM  Nursing: Grayland Ormond, RN 04/13/2016  3:28 PM  RN Care Manager: Lars Pinks, RN 04/13/2016  3:28 PM  Social Worker: Ripley Fraise 04/13/2016  3:28 PM  Recreational Therapist: Laretta Bolster  04/13/2016  3:28 PM  Other: Norberto Sorenson 04/13/2016  3:28 PM  Other:  04/13/2016  3:28 PM    Scribe for Treatment Team:  Roque Lias LCSW 04/13/2016 3:28 PM

## 2016-04-13 NOTE — Progress Notes (Signed)
Recreation Therapy Notes  Date: 04/13/16 Time: 1000 Location: 500 Hall Dayroom  Group Topic: Coping Skills  Goal Area(s) Addresses:  Pt will be able to identify positive coping skills. Pt will be able to identify the benefits of using coping skills post d/c.  Intervention: Can with coping skills and various words written on paper  Activity: Coping Skills Pictionary.  Patients picked a piece of paper from the can with a word on it.  Patient was to draw what was on the strip of paper on the board.  The remainder of the group had to try and guess what the person was drawing.  The person that guesses the picture will get the next turn.   Education: PharmacologistCoping Skills, Building control surveyorDischarge Planning.   Education Outcome: Acknowledges understanding/In group clarification offered/Needs additional education.   Clinical Observations/Feedback: Pt did not attend group.    Caroll RancherMarjette Veola Cafaro, LRT/CTRS         Caroll RancherLindsay, Alysha Doolan A 04/13/2016 12:08 PM

## 2016-04-13 NOTE — Progress Notes (Signed)
Patient ID: Ronald AngstBarry D Kyllonen, male   DOB: [birth year], 55 y.o.   MRN: [id number] D: Client visible on the unit, seen pacing up and down the hall "ain't nothing wrong with me I'm just minding my business, waiting to go home" A: Writer provided emotional support, encouraged client to see physician about discharge plans. Medications reviewed, administered as ordered. Staff will monitor q4015min for safety. R: Client is safe on the unit, did not attend group.

## 2016-04-14 LAB — GLUCOSE, CAPILLARY
GLUCOSE-CAPILLARY: 147 mg/dL — AB (ref 65–99)
GLUCOSE-CAPILLARY: 161 mg/dL — AB (ref 65–99)
Glucose-Capillary: 142 mg/dL — ABNORMAL HIGH (ref 65–99)
Glucose-Capillary: 80 mg/dL (ref 65–99)

## 2016-04-14 MED ORDER — CLOZAPINE 25 MG PO TABS
125.0000 mg | ORAL_TABLET | Freq: Every day | ORAL | Status: DC
  Administered 2016-04-14 – 2016-04-26 (×13): 125 mg via ORAL
  Filled 2016-04-14 (×15): qty 1

## 2016-04-14 NOTE — Progress Notes (Signed)
[facility]  04/14/2016 9:42 AM Osvaldo AngstBarry D Pacitti  MRN:  [id number] Subjective: Patient reports " I feel warm, when can I go home ? "      Objective: Osvaldo AngstBarry D Degrave is a 55 year old AA male with history ofparanoid schizophrenia, DM, HTN,who was found walking naked by neighbors, recommended by his case manager to be brought to the hospital for evaluation.  Patient seen and chart reviewed.Discussed patient with treatment team.  Patient today seen awake and alert . Pt continues to be delusional and grandiose - continues to make irrelevant statements - continues to need redirection . Pt has been tolerating clozaril well , denies ADRs. Pt's CBC was done yesterday - wnl. [facility] level [facility].           Principal Problem: Paranoid schizophrenia (HCC) Diagnosis:   Patient Active Problem List   Diagnosis Date Noted  . Acute psychosis [F23]   . Leukocytosis [D72.829] 02/06/2015  . Sleep apnea [G47.30] 02/05/2015  . AKI (acute kidney injury) (HCC) [N17.9] 02/04/2015  . Seizure disorder (HCC) [G40.909] 02/04/2015  . Acute encephalopathy [G93.40] 02/04/2015  . Diabetes mellitus without complication (HCC) [E11.9]   . Hyperlipidemia [E78.5]   . Hypertension [I10]   . Paranoid schizophrenia (HCC) [F20.0]    Total Time spent with patient: 25 minutes  Past Psychiatric History: Please see H&P.   Past Medical History:  Past Medical History:  Diagnosis Date  . Diabetes mellitus without complication (HCC)   . GERD (gastroesophageal reflux disease)   . Hyperlipidemia   . Hypertension   . Paranoid schizophrenia (HCC)   . Sleep apnea   . Uric acid nephrolithiasis    History reviewed. No pertinent surgical history. Family History: Patient denies Family Psychiatric  History: Pt denies Social History: Patient reports [place] in [place] , is single , has children who does not live with him , reports he gets food stamps . History   Alcohol Use No     History  Drug Use No    Social History   Social History  . Marital status: Single    Spouse name: N/A  . Number of children: N/A  . Years of education: N/A   Social History Main Topics  . Smoking status: Current Every Day Smoker    Packs/day: 1.00    Types: Cigarettes  . Smokeless tobacco: Never Used  . Alcohol use No  . Drug use: No  . Sexual activity: Not Currently   Other Topics Concern  . None   Social History Narrative  . None   Additional Social History:                         Sleep: Fair  Appetite:  Fair  Current Medications: Current Facility-Administered Medications  Medication Dose Route Frequency Provider Last Rate Last Dose  . acetaminophen (TYLENOL) tablet 650 mg  650 mg Oral Q6H PRN Kristeen MansFran E Hobson, NP   650 mg at 03/21/16 0329  . amantadine (SYMMETREL) capsule 100 mg  100 mg Oral BID Laveda AbbeLaurie Britton Parks, NP   100 mg at 04/14/16 0804  . cloZAPine (CLOZARIL) tablet 12.5 mg  12.5 mg Oral Daily Jomarie LongsSaramma Jacquel Mccamish, MD   12.5 mg at 04/14/16 0804  . cloZAPine (CLOZARIL) tablet 125 mg  125 mg Oral QHS Shayma Pfefferle, MD      . divalproex (DEPAKOTE ER) 24 hr tablet 1,250 mg  1,250 mg Oral QHS Jomarie LongsSaramma Hoda Hon, MD   1,250 mg at 04/13/16 2138  . insulin aspart (novoLOG) injection 0-15 Units  0-15 Units Subcutaneous TID WC Jomarie LongsSaramma Abdelaziz Westenberger, MD   2 Units at 04/14/16 0631  . insulin aspart (novoLOG) injection 0-5 Units  0-5 Units Subcutaneous QHS Jomarie LongsSaramma Audry Pecina, MD   5 Units at 04/01/16 2158  . insulin aspart (novoLOG) injection 4 Units  4 Units Subcutaneous TID WC Jomarie LongsSaramma Hani Campusano, MD   4 Units at 04/14/16 [phone]  . insulin glargine (LANTUS) injection 20 Units  20 Units Subcutaneous QHS Jomarie LongsSaramma Argus Caraher, MD   20 Units at 04/13/16 2141  . lisinopril (PRINIVIL,ZESTRIL) tablet 10 mg  10 mg Oral Daily Kristeen MansFran E Hobson, NP   10 mg at 04/14/16 0804  . nicotine polacrilex (NICORETTE) gum 2 mg  2 mg Oral PRN Jomarie LongsSaramma Modupe Shampine, MD   2 mg at 04/11/16 1816  .  OLANZapine (ZYPREXA) tablet 10 mg  10 mg Oral BID PRN Jomarie LongsSaramma Vue Pavon, MD   10 mg at 04/08/16 0839   Or  . OLANZapine (ZYPREXA) injection 10 mg  10 mg Intramuscular BID PRN Jomarie LongsSaramma Tameka Hoiland, MD      . temazepam (RESTORIL) capsule 15 mg  15 mg Oral QHS Jomarie LongsSaramma Moe Graca, MD   15 mg at 04/13/16 2138    Lab Results:  Results for orders placed or performed during the hospital encounter of 03/19/16 (from the past 48 hour(s))  Glucose, capillary     Status: None   Collection Time: 04/12/16 11:44 AM  Result Value Ref Range   Glucose-Capillary 86 65 - 99 mg/dL   Comment 1 Notify RN    Comment 2 Document in Chart   Glucose, capillary     Status: Abnormal   Collection Time: 04/12/16  4:23 PM  Result Value Ref Range   Glucose-Capillary 122 (H) 65 - 99 mg/dL   Comment 1 Notify RN    Comment 2 Document in Chart   Glucose, capillary     Status: Abnormal   Collection Time: 04/12/16  8:29 PM  Result Value Ref Range   Glucose-Capillary 101 (H) 65 - 99 mg/dL  Glucose, capillary     Status: Abnormal   Collection Time: 04/13/16  5:55 AM  Result Value Ref Range   Glucose-Capillary 123 (H) 65 - 99 mg/dL  Glucose, capillary     Status: None   Collection Time: 04/13/16 11:59 AM  Result Value Ref Range   Glucose-Capillary 84 65 - 99 mg/dL   Comment 1 Notify RN   Glucose, capillary     Status: Abnormal   Collection Time: 04/13/16  4:54 PM  Result Value Ref Range   Glucose-Capillary 144 (H) 65 - 99 mg/dL   Comment 1 Notify RN   CBC with Differential/Platelet     Status: Abnormal   Collection Time: 04/13/16  6:20 PM  Result Value Ref Range   WBC 12.2 (H) 4.0 - 10.5 K/uL   RBC 4.65 4.22 - 5.81 MIL/uL   Hemoglobin 12.3 (L) 13.0 - 17.0 g/dL   HCT 98.137.2 (L) 19.139.0 - 47.852.0 %   MCV 80.0 78.0 - 100.0 fL   MCH 26.5 26.0 - 34.0 pg   MCHC 33.1 30.0 - 36.0 g/dL   RDW 29.514.3 62.111.5 - 30.815.5 %   Platelets 319 150 - 400 K/uL   Neutrophils Relative % 65 %   Neutro Abs 8.0 (H) 1.7 - 7.7 K/uL   Lymphocytes Relative 25 %    Lymphs Abs 3.1 0.7 -  4.0 K/uL   Monocytes Relative 8 %   Monocytes Absolute 1.0 0.1 - 1.0 K/uL   Eosinophils Relative 2 %   Eosinophils Absolute 0.2 0.0 - 0.7 K/uL   Basophils Relative 0 %   Basophils Absolute 0.0 0.0 - 0.1 K/uL    Comment: Performed at [facility]  Glucose, capillary     Status: Abnormal   Collection Time: 04/13/16  9:31 PM  Result Value Ref Range   Glucose-Capillary 144 (H) 65 - 99 mg/dL   Comment 1 Notify RN   Glucose, capillary     Status: Abnormal   Collection Time: 04/14/16  5:58 AM  Result Value Ref Range   Glucose-Capillary 142 (H) 65 - 99 mg/dL    Blood Alcohol level:  Lab Results  Component Value Date   ETH <5 03/18/2016   ETH <5 03/01/2016    Metabolic Disorder Labs: Lab Results  Component Value Date   HGBA1C 8.0 (H) 03/23/2016   MPG 183 03/23/2016   MPG 183 03/20/2016   Lab Results  Component Value Date   PROLACTIN 31.5 (H) 03/23/2016   PROLACTIN 36.6 (H) 03/20/2016   Lab Results  Component Value Date   CHOL 145 03/23/2016   TRIG 144 03/23/2016   HDL 30 (L) 03/23/2016   CHOLHDL 4.8 03/23/2016   VLDL 29 03/23/2016   LDLCALC 86 03/23/2016   LDLCALC 21 03/20/2016    Physical Findings: AIMS: Facial and Oral Movements Muscles of Facial Expression: None, normal Lips and Perioral Area: None, normal Jaw: None, normal Tongue: None, normal,Extremity Movements Upper (arms, wrists, hands, fingers): None, normal Lower (legs, knees, ankles, toes): None, normal, Trunk Movements Neck, shoulders, hips: None, normal, Overall Severity Severity of abnormal movements (highest score from questions above): None, normal Incapacitation due to abnormal movements: None, normal Patient's awareness of abnormal movements (rate only patient's report): No Awareness, Dental Status Current problems with teeth and/or dentures?: No Does patient usually wear dentures?: No  CIWA:  CIWA-Ar Total: 1 COWS:  COWS Total Score:  3  Musculoskeletal: Strength & Muscle Tone: within normal limits Gait & Station: normal Patient leans: N/A   Psychiatric Specialty Exam: Physical Exam  Nursing note and vitals reviewed.   Review of Systems  Psychiatric/Behavioral: The patient is nervous/anxious.   All other systems reviewed and are negative.   Blood pressure 117/68, pulse (!) 106, temperature 98.4 F (36.9 C), temperature source Oral, resp. rate 16, height 5' 5.5" (1.664 m), weight 91.6 kg (202 lb), SpO2 100 %.Body mass index is 33.1 kg/m.  General Appearance: Guarded  Eye Contact:  Fair  Speech:  Normal Rate   Volume:  Decreased  Mood:  Anxious and Dysphoric vaguely irritable  Affect:  Inappropriate on and off   Thought Process:  Disorganized, Irrelevant and Descriptions of Associations: Tangential with some improvement   Orientation:  Full (Time, Place, and Person)  Thought Content:  Delusions, Paranoid Ideation and Tangential   Suicidal Thoughts:  No continues to be delusional and disorganized  Homicidal Thoughts:  No  Memory:  Immediate;   Fair Recent;   Poor Remote;   Poor  Judgement:  Impaired  Insight:  Shallow  Psychomotor Activity:  Restlessness  Concentration:  Concentration: Fair and Attention Span: Fair  Recall:  [employer] of Knowledge:  Fair  Language:  Fair  Akathisia:  No  Handed:  Right  AIMS (if indicated):     Assets:  Desire for Improvement  ADL's:  Intact  Cognition:  WNL  Sleep:  Number of Hours: 5   03/31/16 CSW was able to obtain collateral information from his community support - who reported patient's baseline a few months ago was much better , he does have chronic delusions , but he was abel to take care of self and participate better in decision making discussions .        Treatment Plan Summary: ARIN PERAL is a 55 year old AA male with history ofparanoid schizophrenia, DM, HTN,who was found walking naked by neighbors, recommended by his case manager to be  brought to the hospital for evaluation.  Patient today continues to be vaguely irritable , irrelevant , delusional - tolerating his medications. Awaiting [facility] placement.    Paranoid schizophrenia (HCC) currently unstable.  Will continue today 04/14/16  plan as below except where it is noted.  Daily contact with patient to assess and evaluate symptoms and progress in treatment and Medication management    Will continue  Clozaril  12.5 mg po daily and increase clozaril to 125 mg po qhs for psychosis.    Reviewed CBC with diff - 04/06/16- ANC- 8.6 /WBC- 12.8  wnl.needs weekly CBC/ANC- 04/13/16-ANC - 8/WBC-12.2  Reduced Depakote  ER to 1000 mg po qhs for mood lability , although  Depakote level  - 03/25/16- 72 ( therapeutic) ,  depakote level on 03/30/16- I have reviewed depakote level  - 74- therapeutic.Dose reduced to address his tiredness. Will get another depakote level on 04/16/16.  Will continue Restoril 15 mg po qhs for sleep.  Will continue to monitor vitals ,medication compliance and treatment side effects while patient is here.   Reviewed labs: EKG - qtc - wnl, TSH- wnl , hba1c- 8- dietician consult once patient is more stable  ,  pl -31.5 ,  lipid panel HDL - low - diet control recommendations to be provided when patient is more stable.  CSW will continue  working on disposition. Patient  referred to [facility] for further level of care.Pt is on their wait list.  Patient to participate in therapeutic milieu Kayla Deshaies, MD 04/14/2016, 9:42 AM

## 2016-04-14 NOTE — Progress Notes (Signed)
D: Patient denies SI/HI and A/V hallucinations  A: Monitored q 15 minutes; patient encouraged to attend groups; patient educated about medications; patient given medications per physician orders; patient encouraged to express feelings and/or concerns  R: Patient is cooperative; patient paces all day; patient is flat and sad; patient bags continually packed and reports that he is living; patient's interaction with staff and peers is minimal; patient was able to set goal to talk with staff 1:1 when having feelings of SI; patient is taking medications as prescribed and tolerating medications

## 2016-04-14 NOTE — [facility] Group Notes (Signed)
[facility]  04/14/2016 , 1:25 PM    Type of Therapy:  [employer] Presentation  Participation Level:  Active  Participation Quality:  Attentive  Affect:  Blunted  Cognitive:  Oriented  Insight:  Limited  Engagement in Therapy:  Engaged  Modes of Intervention:  Discussion, Education and Socialization  Summary of Progress/Problems:  [name] from [employer] came to present his recovery story and play the guitar.  Started out in group.  In and out a couple of times.  Finaly left and stayed out.  Observed pacing in hall.  Daryel Geraldorth, Jaziya Obarr B 04/14/2016 , 1:25 PM

## 2016-04-14 NOTE — Progress Notes (Signed)
Recreation Therapy Notes  Date: 04/14/16 Time: 1000 Location: 500 Hall Dayroom  Group Topic: Self-Expression  Goal Area(s) Addresses:  Patient will identify things they are thankful for. Patient will verbalize benefit of being thankful.  Behavioral Response: Engaged  Intervention: Worksheet with a picture frame, markers  Activity: What I'm Thankful For.  Patients were given a worksheet with the outline of a picture frame.  Patients were to draw, write a poem or list the things they were thankful for.  Education:  Self-Expression, Building control surveyorDischarge Planning.   Education Outcome: Acknowledges education/In group clarification offered/Needs additional education  Clinical Observations/Feedback: Pt was quiet and appeared focused on the activity.  Pt was appropriate during group.  Pt stated he was thankful for his life, health, strength and getting rid of his children.  Pt stated that "if you're not thankful somebody can use you the wrong way".     Caroll RancherMarjette Debbie Bellucci, LRT/CTRS      Lillia AbedLindsay, Tyshay Adee A 04/14/2016 11:12 AM

## 2016-04-14 NOTE — Progress Notes (Signed)
Patient ID: Ronald AngstBarry D Roman, male   DOB: 09-30-1960, 55 y.o.   MRN: [id number] D: Client visible on the unit, at the nurses station a lot, requesting to go home, "she said she was going to let me go" "they suppose to send somebody at me" A: Writer provide support, encouraged client to get some rest tonight and speak to physician about discharge tomorrow. Medications reviewed. Administered as ordered. Staff will monitor q8215min for safety. R: Client is safe on the unit, walks in and out of group.

## 2016-04-15 LAB — GLUCOSE, CAPILLARY
GLUCOSE-CAPILLARY: 187 mg/dL — AB (ref 65–99)
Glucose-Capillary: 166 mg/dL — ABNORMAL HIGH (ref 65–99)
Glucose-Capillary: 179 mg/dL — ABNORMAL HIGH (ref 65–99)
Glucose-Capillary: 241 mg/dL — ABNORMAL HIGH (ref 65–99)

## 2016-04-15 NOTE — Progress Notes (Signed)
Patient ID: Osvaldo AngstBarry D Nicolaou, male   DOB: 07/06/1960, 55 y.o.   MRN: [id number]    D: Pt has been very isolative on the unit today, he remains to himself. Pt does not interact with peers or staff. This writer attempted to talk to patient and encourage him to eat because he got insulin this morning then refused to eat. Pt continued to refused to eat, pt blood sugar at lunch was 166. Pt refused to fill out his patient self inventory sheet, he reported that he just wanted to be discharged. Pt did not want to go to lunch he reported that he was just waiting to be discharged and that he had been waiting 6 years to go home. After lunch patient came back to the unit and laid in the floor to go to sleep. Staff tried to talk to patient about sleeping in his room, patient refused. Pt reported that he was not depressed, hopelessness, and that he was not having any anxiety. Pt reported being negative SI/HI, no AH/VH noted. A: 15 min checks continued for patient safety. R: Pt safety maintained.

## 2016-04-15 NOTE — Progress Notes (Signed)
[facility]  04/15/2016 1:14 PM Ronald AngstBarry D Roman  MRN:  [id number]   Subjective: Patient reports " I feel a lot bette"  Objective: Ronald Roman is a 55 year old AA male with history ofparanoid schizophrenia, DM, HTN,who was found walking naked by neighbors, recommended by his case manager to be brought to the hospital for evaluation.  Patient seen and chart reviewed.Discussed patient with treatment team.  Patient today seen awake and alert . Pt continues to be delusional and grandiose - continues to make irrelevant statements - continues to need redirection . Pt has been tolerating clozaril well , denies ADRs. Pt's CBC was done yesterday - wnl. [facility] for higher level of care.  Principal Problem: Paranoid schizophrenia (HCC)  Diagnosis:   Patient Active Problem List   Diagnosis Date Noted  . Acute psychosis [F23]   . Leukocytosis [D72.829] 02/06/2015  . Sleep apnea [G47.30] 02/05/2015  . AKI (acute kidney injury) (HCC) [N17.9] 02/04/2015  . Seizure disorder (HCC) [G40.909] 02/04/2015  . Acute encephalopathy [G93.40] 02/04/2015  . Diabetes mellitus without complication (HCC) [E11.9]   . Hyperlipidemia [E78.5]   . Hypertension [I10]   . Paranoid schizophrenia (HCC) [F20.0]    Total Time spent with patient: 15 minutes  Past Psychiatric History: Please see H&P.  Past Medical History:  Past Medical History:  Diagnosis Date  . Diabetes mellitus without complication (HCC)   . GERD (gastroesophageal reflux disease)   . Hyperlipidemia   . Hypertension   . Paranoid schizophrenia (HCC)   . Sleep apnea   . Uric acid nephrolithiasis    History reviewed. No pertinent surgical history. Family History: Patient denies Family Psychiatric  History: Pt denies Social History: Patient reports he lives in [place] , is single , has children who does not live with him , reports he gets food stamps . History  Alcohol Use No     History   Drug Use No    Social History   Social History  . Marital status: Single    Spouse name: N/A  . Number of children: N/A  . Years of education: N/A   Social History Main Topics  . Smoking status: Current Every Day Smoker    Packs/day: 1.00    Types: Cigarettes  . Smokeless tobacco: Never Used  . Alcohol use No  . Drug use: No  . Sexual activity: Not Currently   Other Topics Concern  . None   Social History Narrative  . None   Additional Social History:   Sleep: Fair  Appetite:  Fair  Current Medications: Current Facility-Administered Medications  Medication Dose Route Frequency Provider Last Rate Last Dose  . acetaminophen (TYLENOL) tablet 650 mg  650 mg Oral Q6H PRN Kristeen MansFran E Hobson, NP   650 mg at 03/21/16 0329  . amantadine (SYMMETREL) capsule 100 mg  100 mg Oral BID Laveda AbbeLaurie Britton Parks, NP   100 mg at 04/15/16 0737  . cloZAPine (CLOZARIL) tablet 12.5 mg  12.5 mg Oral Daily Jomarie LongsSaramma Eappen, MD   12.5 mg at 04/15/16 0737  . cloZAPine (CLOZARIL) tablet 125 mg  125 mg Oral QHS Jomarie LongsSaramma Eappen, MD   125 mg at 04/14/16 2107  . divalproex (DEPAKOTE ER) 24 hr tablet 1,250 mg  1,250 mg Oral QHS Jomarie LongsSaramma Eappen, MD   1,250 mg at 04/14/16 2107  . insulin aspart (novoLOG) injection 0-15 Units  0-15 Units Subcutaneous TID WC Jomarie LongsSaramma Eappen, MD   3  Units at 04/15/16 0619  . insulin aspart (novoLOG) injection 0-5 Units  0-5 Units Subcutaneous QHS Jomarie Longs, MD   5 Units at 04/01/16 2158  . insulin aspart (novoLOG) injection 4 Units  4 Units Subcutaneous TID WC Jomarie Longs, MD   4 Units at 04/15/16 0621  . insulin glargine (LANTUS) injection 20 Units  20 Units Subcutaneous QHS Jomarie Longs, MD   20 Units at 04/14/16 2106  . lisinopril (PRINIVIL,ZESTRIL) tablet 10 mg  10 mg Oral Daily Kristeen Mans, NP   10 mg at 04/15/16 0737  . nicotine polacrilex (NICORETTE) gum 2 mg  2 mg Oral PRN Jomarie Longs, MD   2 mg at 04/11/16 1816  . OLANZapine (ZYPREXA) tablet 10 mg  10 mg Oral BID  PRN Jomarie Longs, MD   10 mg at 04/08/16 0839   Or  . OLANZapine (ZYPREXA) injection 10 mg  10 mg Intramuscular BID PRN Jomarie Longs, MD      . temazepam (RESTORIL) capsule 15 mg  15 mg Oral QHS Jomarie Longs, MD   15 mg at 04/14/16 2108    Lab Results:  Results for orders placed or performed during the hospital encounter of 03/19/16 (from the past 48 hour(s))  Glucose, capillary     Status: Abnormal   Collection Time: 04/13/16  4:54 PM  Result Value Ref Range   Glucose-Capillary 144 (H) 65 - 99 mg/dL   Comment 1 Notify RN   CBC with Differential/Platelet     Status: Abnormal   Collection Time: 04/13/16  6:20 PM  Result Value Ref Range   WBC 12.2 (H) 4.0 - 10.5 K/uL   RBC 4.65 4.22 - 5.81 MIL/uL   Hemoglobin 12.3 (L) 13.0 - 17.0 g/dL   HCT 16.1 (L) 09.6 - 04.5 %   MCV 80.0 78.0 - 100.0 fL   MCH 26.5 26.0 - 34.0 pg   MCHC 33.1 30.0 - 36.0 g/dL   RDW 40.9 81.1 - 91.4 %   Platelets 319 150 - 400 K/uL   Neutrophils Relative % 65 %   Neutro Abs 8.0 (H) 1.7 - 7.7 K/uL   Lymphocytes Relative 25 %   Lymphs Abs 3.1 0.7 - 4.0 K/uL   Monocytes Relative 8 %   Monocytes Absolute 1.0 0.1 - 1.0 K/uL   Eosinophils Relative 2 %   Eosinophils Absolute 0.2 0.0 - 0.7 K/uL   Basophils Relative 0 %   Basophils Absolute 0.0 0.0 - 0.1 K/uL    Comment: Performed at [facility]  Glucose, capillary     Status: Abnormal   Collection Time: 04/13/16  9:31 PM  Result Value Ref Range   Glucose-Capillary 144 (H) 65 - 99 mg/dL   Comment 1 Notify RN   Glucose, capillary     Status: Abnormal   Collection Time: 04/14/16  5:58 AM  Result Value Ref Range   Glucose-Capillary 142 (H) 65 - 99 mg/dL  Glucose, capillary     Status: None   Collection Time: 04/14/16 11:30 AM  Result Value Ref Range   Glucose-Capillary 80 65 - 99 mg/dL   Comment 1 Notify RN    Comment 2 Document in Chart   Glucose, capillary     Status: Abnormal   Collection Time: 04/14/16  4:54 PM  Result Value Ref  Range   Glucose-Capillary 147 (H) 65 - 99 mg/dL   Comment 1 Notify RN    Comment 2 Document in Chart   Glucose, capillary  Status: Abnormal   Collection Time: 04/14/16  8:49 PM  Result Value Ref Range   Glucose-Capillary 161 (H) 65 - 99 mg/dL  Glucose, capillary     Status: Abnormal   Collection Time: 04/15/16  5:59 AM  Result Value Ref Range   Glucose-Capillary 187 (H) 65 - 99 mg/dL  Glucose, capillary     Status: Abnormal   Collection Time: 04/15/16 11:58 AM  Result Value Ref Range   Glucose-Capillary 166 (H) 65 - 99 mg/dL   Blood Alcohol level:  Lab Results  Component Value Date   ETH <5 03/18/2016   ETH <5 03/01/2016   Metabolic Disorder Labs: Lab Results  Component Value Date   HGBA1C 8.0 (H) 03/23/2016   MPG 183 03/23/2016   MPG 183 03/20/2016   Lab Results  Component Value Date   PROLACTIN 31.5 (H) 03/23/2016   PROLACTIN 36.6 (H) 03/20/2016   Lab Results  Component Value Date   CHOL 145 03/23/2016   TRIG 144 03/23/2016   HDL 30 (L) 03/23/2016   CHOLHDL 4.8 03/23/2016   VLDL 29 03/23/2016   LDLCALC 86 03/23/2016   LDLCALC 21 03/20/2016   Physical Findings: AIMS: Facial and Oral Movements Muscles of Facial Expression: None, normal Lips and Perioral Area: None, normal Jaw: None, normal Tongue: None, normal,Extremity Movements Upper (arms, wrists, hands, fingers): None, normal Lower (legs, knees, ankles, toes): None, normal, Trunk Movements Neck, shoulders, hips: None, normal, Overall Severity Severity of abnormal movements (highest score from questions above): None, normal Incapacitation due to abnormal movements: None, normal Patient's awareness of abnormal movements (rate only patient's report): No Awareness, Dental Status Current problems with teeth and/or dentures?: No Does patient usually wear dentures?: No  CIWA:  CIWA-Ar Total: 1 COWS:  COWS Total Score: 3  Musculoskeletal: Strength & Muscle Tone: within normal limits Gait & Station:  normal Patient leans: N/A   Psychiatric Specialty Exam: Physical Exam  Nursing note and vitals reviewed.   Review of Systems  Psychiatric/Behavioral: The patient is nervous/anxious.   All other systems reviewed and are negative.   Blood pressure 117/69, pulse (!) 110, temperature 98.4 F (36.9 C), resp. rate 20, height 5' 5.5" (1.664 m), weight 91.6 kg (202 lb), SpO2 100 %.Body mass index is 33.1 kg/m.  General Appearance: Guarded  Eye Contact:  Fair  Speech:  Normal Rate   Volume:  Decreased  Mood:  Anxious and Dysphoric vaguely irritable  Affect:  Inappropriate on and off   Thought Process:  Disorganized, Irrelevant and Descriptions of Associations: Tangential with some improvement   Orientation:  Full (Time, Place, and Person)  Thought Content:  Delusions, Paranoid Ideation and Tangential   Suicidal Thoughts:  No continues to be delusional and disorganized  Homicidal Thoughts:  No  Memory:  Immediate;   Fair Recent;   Poor Remote;   Poor  Judgement:  Impaired  Insight:  Shallow  Psychomotor Activity:  Restlessness  Concentration:  Concentration: Fair and Attention Span: Fair  Recall:  [employer]Fair  Fund of Knowledge:  Fair  Language:  Fair  Akathisia:  No  Handed:  Right  AIMS (if indicated):     Assets:  Desire for Improvement  ADL's:  Intact  Cognition:  WNL  Sleep:  Number of Hours: 5   03/31/16 CSW was able to obtain collateral information from his community support - who reported patient's baseline a few months ago was much better , he does have chronic delusions , but he was abel to take care  of self and participate better in decision making discussions .   Treatment Plan Summary: Ronald Roman is a 55 year old AA male with history ofparanoid schizophrenia, DM, HTN,who was found walking naked by neighbors, recommended by his case manager to be brought to the hospital for evaluation.  Patient today continues to be vaguely irritable , irrelevant , delusional -  tolerating his medications. Awaiting [facility] placement.    Paranoid schizophrenia (HCC) currently unstable.  Will continue today 04/15/16  plan as below except where it is noted.  Daily contact with patient to assess and evaluate symptoms and progress in treatment and Medication management    Will continue  Clozaril  12.5 mg po daily and increase clozaril to 125 mg po qhs for psychosis.    Reviewed CBC with diff - 04/06/16- ANC- 8.6 /WBC- 12.8  wnl.needs weekly CBC/ANC- 04/13/16-ANC - 8/WBC-12.2  Continue Depakote  ER 1000 mg po qhs for mood lability , although  Depakote level  - 03/25/16- 72 ( therapeutic) ,  depakote level on 03/30/16- I have reviewed depakote level  - 74- therapeutic.Dose reduced to address his tiredness. Will get another depakote level on 04/16/16.  Will continue Restoril 15 mg po qhs for sleep.  Will continue to monitor vitals ,medication compliance and treatment side effects while patient is here.   Reviewed labs: EKG - qtc - wnl, TSH- wnl , hba1c- 8- dietician consult once patient is more stable  ,  pl -31.5 ,  lipid panel HDL - low - diet control recommendations to be provided when patient is more stable.  CSW will continue  working on disposition. Patient  referred to [name] for further level of care.Pt is on their wait list.  Patient to participate in therapeutic milieu  Sanjuana Kava, NP , PMHNP, FNP-BC 04/15/2016, 1:14 PMPatient ID: Ronald Roman, male   DOB: 1960-12-07, 55 y.o.   MRN: [id number]

## 2016-04-15 NOTE — Progress Notes (Signed)
Adult Psychoeducational Group Note  Date:  04/15/2016 Time:  8:39 PM  Group Topic/Focus:  Wrap-Up Group:   The focus of this group is to help patients review their daily goal of treatment and discuss progress on daily workbooks.   Participation Level:  Active  Participation Quality:  Appropriate  Affect:  Appropriate  Cognitive:  Appropriate  Insight: Appropriate  Engagement in Group:  Engaged  Modes of Intervention:  Discussion  Additional Comments:  The patient expressed that his day was ok. Octavio Mannshigpen, Myrakle Wingler Lee 04/15/2016, 8:39 PM

## 2016-04-15 NOTE — Progress Notes (Signed)
D: Pt denies SI/HI/AVH. Pt is pleasant and cooperative. Pt did eat a meal and was given an extra snack due to him not eating earlier in the day. Pt continues to walk around with his bag prepared to leave.   A: Pt was offered support and encouragement. Pt was given scheduled medications. Pt was encourage to attend groups. Q 15 minute checks were done for safety.   R:Pt attends groups and interacts well with peers and staff. Pt is taking medication. Pt has no complaints.Pt receptive to treatment and safety maintained on unit.

## 2016-04-16 LAB — GLUCOSE, CAPILLARY
GLUCOSE-CAPILLARY: 179 mg/dL — AB (ref 65–99)
GLUCOSE-CAPILLARY: 198 mg/dL — AB (ref 65–99)
GLUCOSE-CAPILLARY: 174 mg/dL — AB (ref 65–99)
Glucose-Capillary: 97 mg/dL (ref 65–99)

## 2016-04-16 NOTE — Progress Notes (Signed)
D: Pt denies SI/HI/AVh. Pt is pleasant and cooperative. Pt stated he was ok, pt seen interacting on the unit at times, but will mostly keep to himself.   A: Pt was offered support and encouragement. Pt was given scheduled medications. Pt was encourage to attend groups. Q 15 minute checks were done for safety.   R:Pt attends groups and interacts well with peers and staff. Pt is taking medication. Pt has no complaints.Pt receptive to treatment and safety maintained on unit.

## 2016-04-16 NOTE — [facility] Group Notes (Signed)
[facility] LCSW Group Therapy  04/16/2016  1:05 PM  Type of Therapy:  Group therapy  Participation Level:  Active  Participation Quality:  Attentive  Affect:  Flat  Cognitive:  Oriented  Insight:  Limited  Engagement in Therapy:  Limited  Modes of Intervention:  Discussion, Socialization  Summary of Progress/Problems:  Chaplain was here to lead a group on themes of hope and courage. Invited.  Chose to not attend.  Ronald Roman, Ronald Roman B 04/16/2016 1:51 PM

## 2016-04-16 NOTE — Plan of Care (Signed)
Problem: Coping: Goal: Ability to verbalize frustrations and anger appropriately will improve Outcome: Progressing Pt stated he was ready to leave, and pt walked around with his bags as if he was about to leave

## 2016-04-16 NOTE — Progress Notes (Signed)
[facility] MD Progress Note  04/16/2016 2:36 PM Ronald Roman  MRN:  [id number]   Subjective: Patient reports " I'm alright""  Objective: Ronald Roman is a 55 year old AA male with history ofparanoid schizophrenia, DM, HTN,who was found walking naked by neighbors, recommended by his case manager to be brought to the hospital for evaluation.  Patient seen and chart reviewed.Discussed patient with treatment team.  Patient today seen awake and alert . Pt continues to be delusional and grandiose - continues to make irrelevant statements - continues to need redirection . Pt has been tolerating clozaril well , denies ADRs. Pt's CBC was done yesterday - wnl. Pt continues to be on [facility] waitlist - for higher level of care.  Principal Problem: Paranoid schizophrenia (HCC)  Diagnosis:   Patient Active Problem List   Diagnosis Date Noted  . Acute psychosis [F23]   . Leukocytosis [D72.829] 02/06/2015  . Sleep apnea [G47.30] 02/05/2015  . AKI (acute kidney injury) (HCC) [N17.9] 02/04/2015  . Seizure disorder (HCC) [G40.909] 02/04/2015  . Acute encephalopathy [G93.40] 02/04/2015  . Diabetes mellitus without complication (HCC) [E11.9]   . Hyperlipidemia [E78.5]   . Hypertension [I10]   . Paranoid schizophrenia (HCC) [F20.0]    Total Time spent with patient: 15 minutes  Past Psychiatric History: Please see H&P.  Past Medical History:  Past Medical History:  Diagnosis Date  . Diabetes mellitus without complication (HCC)   . GERD (gastroesophageal reflux disease)   . Hyperlipidemia   . Hypertension   . Paranoid schizophrenia (HCC)   . Sleep apnea   . Uric acid nephrolithiasis    History reviewed. No pertinent surgical history.  Family History: Patient denies  Family Psychiatric  History: Pt denies  Social History: Patient reports he lives in [place] , is single , has children who does not live with him , reports he gets food stamps . History  Alcohol Use No     History   Drug Use No    Social History   Social History  . Marital status: Single    Spouse name: N/A  . Number of children: N/A  . Years of education: N/A   Social History Main Topics  . Smoking status: Current Every Day Smoker    Packs/day: 1.00    Types: Cigarettes  . Smokeless tobacco: Never Used  . Alcohol use No  . Drug use: No  . Sexual activity: Not Currently   Other Topics Concern  . None   Social History Narrative  . None   Additional Social History:   Sleep: Fair  Appetite:  Fair  Current Medications: Current Facility-Administered Medications  Medication Dose Route Frequency Provider Last Rate Last Dose  . acetaminophen (TYLENOL) tablet 650 mg  650 mg Oral Q6H PRN Kristeen Mans, NP   650 mg at 03/21/16 0329  . amantadine (SYMMETREL) capsule 100 mg  100 mg Oral BID Laveda Abbe, NP   100 mg at 04/16/16 0745  . cloZAPine (CLOZARIL) tablet 12.5 mg  12.5 mg Oral Daily Saramma Eappen, MD   12.5 mg at 04/16/16 0745  . cloZAPine (CLOZARIL) tablet 125 mg  125 mg Oral QHS Jomarie Longs, MD   125 mg at 04/15/16 2127  . divalproex (DEPAKOTE ER) 24 hr tablet 1,250 mg  1,250 mg Oral QHS Jomarie Longs, MD   1,250 mg at 04/15/16 2127  . insulin aspart (novoLOG) injection 0-15 Units  0-15 Units Subcutaneous TID WC Jomarie Longs, MD   3  Units at 04/16/16 1610  . insulin aspart (novoLOG) injection 0-5 Units  0-5 Units Subcutaneous QHS Jomarie Longs, MD   2 Units at 04/15/16 2040  . insulin aspart (novoLOG) injection 4 Units  4 Units Subcutaneous TID WC Jomarie Longs, MD   4 Units at 04/16/16 1248  . insulin glargine (LANTUS) injection 20 Units  20 Units Subcutaneous QHS Jomarie Longs, MD   20 Units at 04/15/16 2040  . lisinopril (PRINIVIL,ZESTRIL) tablet 10 mg  10 mg Oral Daily Kristeen Mans, NP   10 mg at 04/16/16 0745  . nicotine polacrilex (NICORETTE) gum 2 mg  2 mg Oral PRN Jomarie Longs, MD   2 mg at 04/11/16 1816  . OLANZapine (ZYPREXA) tablet 10 mg  10 mg Oral BID  PRN Jomarie Longs, MD   10 mg at 04/08/16 0839   Or  . OLANZapine (ZYPREXA) injection 10 mg  10 mg Intramuscular BID PRN Jomarie Longs, MD      . temazepam (RESTORIL) capsule 15 mg  15 mg Oral QHS Jomarie Longs, MD   15 mg at 04/15/16 2127   Lab Results:  Results for orders placed or performed during the hospital encounter of 03/19/16 (from the past 48 hour(s))  Glucose, capillary     Status: Abnormal   Collection Time: 04/14/16  4:54 PM  Result Value Ref Range   Glucose-Capillary 147 (H) 65 - 99 mg/dL   Comment 1 Notify RN    Comment 2 Document in Chart   Glucose, capillary     Status: Abnormal   Collection Time: 04/14/16  8:49 PM  Result Value Ref Range   Glucose-Capillary 161 (H) 65 - 99 mg/dL  Glucose, capillary     Status: Abnormal   Collection Time: 04/15/16  5:59 AM  Result Value Ref Range   Glucose-Capillary 187 (H) 65 - 99 mg/dL  Glucose, capillary     Status: Abnormal   Collection Time: 04/15/16 11:58 AM  Result Value Ref Range   Glucose-Capillary 166 (H) 65 - 99 mg/dL  Glucose, capillary     Status: Abnormal   Collection Time: 04/15/16  4:53 PM  Result Value Ref Range   Glucose-Capillary 179 (H) 65 - 99 mg/dL  Glucose, capillary     Status: Abnormal   Collection Time: 04/15/16  8:23 PM  Result Value Ref Range   Glucose-Capillary 241 (H) 65 - 99 mg/dL  Glucose, capillary     Status: Abnormal   Collection Time: 04/16/16  6:17 AM  Result Value Ref Range   Glucose-Capillary 179 (H) 65 - 99 mg/dL  Glucose, capillary     Status: None   Collection Time: 04/16/16 11:46 AM  Result Value Ref Range   Glucose-Capillary 97 65 - 99 mg/dL   Blood Alcohol level:  Lab Results  Component Value Date   ETH <5 03/18/2016   ETH <5 03/01/2016   Metabolic Disorder Labs: Lab Results  Component Value Date   HGBA1C 8.0 (H) 03/23/2016   MPG 183 03/23/2016   MPG 183 03/20/2016   Lab Results  Component Value Date   PROLACTIN 31.5 (H) 03/23/2016   PROLACTIN 36.6 (H)  03/20/2016   Lab Results  Component Value Date   CHOL 145 03/23/2016   TRIG 144 03/23/2016   HDL 30 (L) 03/23/2016   CHOLHDL 4.8 03/23/2016   VLDL 29 03/23/2016   LDLCALC 86 03/23/2016   LDLCALC 21 03/20/2016   Physical Findings: AIMS: Facial and Oral Movements Muscles of Facial Expression: None, normal  Lips and Perioral Area: None, normal Jaw: None, normal Tongue: None, normal,Extremity Movements Upper (arms, wrists, hands, fingers): None, normal Lower (legs, knees, ankles, toes): None, normal, Trunk Movements Neck, shoulders, hips: None, normal, Overall Severity Severity of abnormal movements (highest score from questions above): None, normal Incapacitation due to abnormal movements: None, normal Patient's awareness of abnormal movements (rate only patient's report): No Awareness, Dental Status Current problems with teeth and/or dentures?: No Does patient usually wear dentures?: No  CIWA:  CIWA-Ar Total: 1 COWS:  COWS Total Score: 3  Musculoskeletal: Strength & Muscle Tone: within normal limits Gait & Station: normal Patient leans: N/A   Psychiatric Specialty Exam: Physical Exam  Nursing note and vitals reviewed.   Review of Systems  Psychiatric/Behavioral: The patient is nervous/anxious.   All other systems reviewed and are negative.   Blood pressure 117/69, pulse (!) 110, temperature 98.4 F (36.9 C), resp. rate 20, height 5' 5.5" (1.664 m), weight 91.6 kg (202 lb), SpO2 100 %.Body mass index is 33.1 kg/m.  General Appearance: Guarded  Eye Contact:  Fair  Speech:  Normal Rate   Volume:  Decreased  Mood:  Anxious and Dysphoric vaguely irritable  Affect:  Inappropriate on and off   Thought Process:  Disorganized, Irrelevant and Descriptions of Associations: Tangential with some improvement   Orientation:  Full (Time, Place, and Person)  Thought Content:  Delusions, Paranoid Ideation and Tangential   Suicidal Thoughts:  No continues to be delusional and  disorganized  Homicidal Thoughts:  No  Memory:  Immediate;   Fair Recent;   Poor Remote;   Poor  Judgement:  Impaired  Insight:  Shallow  Psychomotor Activity:  Restlessness  Concentration:  Concentration: Fair and Attention Span: Fair  Recall:  [employer]Fair  Fund of Knowledge:  Fair  Language:  Fair  Akathisia:  No  Handed:  Right  AIMS (if indicated):     Assets:  Desire for Improvement  ADL's:  Intact  Cognition:  WNL  Sleep:  Number of Hours: 6   03/31/16 CSW was able to obtain collateral information from his community support - who reported patient's baseline a few months ago was much better , he does have chronic delusions , but he was abel to take care of self and participate better in decision making discussions .   Treatment Plan Summary: Ronald Roman is a 55 year old AA male with history ofparanoid schizophrenia, DM, HTN,who was found walking naked by neighbors, recommended by his case manager to be brought to the hospital for evaluation.  Patient today continues to be vaguely irritable , irrelevant , delusional - tolerating his medications. Awaiting [facility] placement.    Paranoid schizophrenia (HCC) currently unstable.  Will continue today 04/16/16  plan as below except where it is noted.  Daily contact with patient to assess and evaluate symptoms and progress in treatment and Medication management    Will continue  Clozaril  12.5 mg po daily and increase clozaril to 125 mg po qhs for psychosis.    Reviewed CBC with diff - 04/06/16- ANC- 8.6 /WBC- 12.8  wnl.needs weekly CBC/ANC- 04/13/16-ANC - 8/WBC-12.2  Continue Depakote  ER 1000 mg po qhs for mood lability , although  Depakote level  - 03/25/16- 72 ( therapeutic) ,  depakote level on 03/30/16- I have reviewed depakote level  - 74- therapeutic.Dose reduced to address his tiredness. Will get another depakote level on 04/16/16.  Will continue Restoril 15 mg po qhs for sleep.  Will continue  to monitor vitals ,medication  compliance and treatment side effects while patient is here.   Reviewed labs: EKG - qtc - wnl, TSH- wnl , hba1c- 8- dietician consult once patient is more stable  ,  pl -31.5 ,  lipid panel HDL - low - diet control recommendations to be provided when patient is more stable.  CSW will continue  working on disposition. [facility].Pt is on their [facility] list.  Patient to participate in therapeutic milieu  Sanjuana KavaNwoko, Cree Kunert I, NP , PMHNP, FNP-BC 04/16/2016, 2:36 PMPatient ID: Ronald Roman, male   DOB: 1960/09/28, 55 y.o.   MRN: [id number] Patient ID: Ronald Roman, male   DOB: 1960/09/28, 55 y.o.   MRN: [id number]

## 2016-04-16 NOTE — Tx Team (Signed)
Interdisciplinary Treatment and Diagnostic Plan Update  04/16/2016 Time of Session: 11:27 AM  Ronald Roman MRN: [id number]  Principal Diagnosis: Paranoid schizophrenia [facility])  Secondary Diagnoses: Principal Problem:   Paranoid schizophrenia ([place])   Current Medications:  Current Facility-Administered Medications  Medication Dose Route Frequency Provider Last Rate Last Dose  . acetaminophen (TYLENOL) tablet 650 mg  650 mg Oral Q6H PRN Lurena Nida, NP   650 mg at 03/21/16 0329  . amantadine (SYMMETREL) capsule 100 mg  100 mg Oral BID Ethelene Hal, NP   100 mg at 04/16/16 0745  . cloZAPine (CLOZARIL) tablet 12.5 mg  12.5 mg Oral Daily Saramma Eappen, MD   12.5 mg at 04/16/16 0745  . cloZAPine (CLOZARIL) tablet 125 mg  125 mg Oral QHS Ursula Alert, MD   125 mg at 04/15/16 2127  . divalproex (DEPAKOTE ER) 24 hr tablet 1,250 mg  1,250 mg Oral QHS Ursula Alert, MD   1,250 mg at 04/15/16 2127  . insulin aspart (novoLOG) injection 0-15 Units  0-15 Units Subcutaneous TID WC Ursula Alert, MD   3 Units at 04/16/16 0623  . insulin aspart (novoLOG) injection 0-5 Units  0-5 Units Subcutaneous QHS Ursula Alert, MD   2 Units at 04/15/16 2040  . insulin aspart (novoLOG) injection 4 Units  4 Units Subcutaneous TID WC Ursula Alert, MD   4 Units at 04/16/16 0622  . insulin glargine (LANTUS) injection 20 Units  20 Units Subcutaneous QHS Ursula Alert, MD   20 Units at 04/15/16 2040  . lisinopril (PRINIVIL,ZESTRIL) tablet 10 mg  10 mg Oral Daily Lurena Nida, NP   10 mg at 04/16/16 0745  . nicotine polacrilex (NICORETTE) gum 2 mg  2 mg Oral PRN Ursula Alert, MD   2 mg at 04/11/16 1816  . OLANZapine (ZYPREXA) tablet 10 mg  10 mg Oral BID PRN Ursula Alert, MD   10 mg at 04/08/16 0839   Or  . OLANZapine (ZYPREXA) injection 10 mg  10 mg Intramuscular BID PRN Ursula Alert, MD      . temazepam (RESTORIL) capsule 15 mg  15 mg Oral QHS Ursula Alert, MD   15 mg at 04/15/16 2127    PTA  Medications: Prescriptions Prior to Admission  Medication Sig Dispense Refill Last Dose  . albuterol (PROVENTIL HFA;VENTOLIN HFA) 108 (90 BASE) MCG/ACT inhaler Inhale 2 puffs into the lungs every 4 (four) hours as needed for wheezing or shortness of breath. (Patient not taking: Reported on 03/19/2016) 1 Inhaler 0 Not Taking at Unknown time  . cetirizine (ZYRTEC) 10 MG chewable tablet Chew 1 tablet (10 mg total) by mouth daily. (Patient not taking: Reported on 03/19/2016) 20 tablet 1 Not Taking at Unknown time  . cloZAPine (CLOZARIL) 100 MG tablet Take 1 tablet (100 mg total) by mouth 2 (two) times daily. (Patient not taking: Reported on 03/19/2016) 60 tablet 0 Not Taking at Unknown time  . divalproex (DEPAKOTE) 250 MG DR tablet Take 1 tablet (250 mg total) by mouth at bedtime. (Patient not taking: Reported on 03/19/2016) 30 tablet 0 Not Taking at Unknown time  . divalproex (DEPAKOTE) 500 MG DR tablet Take 2 tablets (1,000 mg total) by mouth at bedtime. (Patient not taking: Reported on 03/19/2016) 60 tablet 0 Not Taking at Unknown time  . insulin glargine (LANTUS) 100 UNIT/ML injection Inject 10 Units into the skin at bedtime.   Not Taking at Unknown time  . lisinopril (PRINIVIL,ZESTRIL) 10 MG tablet Take 10 mg by mouth  daily.    Not Taking at Unknown time  . LORazepam (ATIVAN) 0.5 MG tablet Take 1 tablet (0.5 mg total) by mouth 2 (two) times daily. (Patient not taking: Reported on 03/19/2016) 60 tablet 0 Not Taking at Unknown time  . metoprolol tartrate (LOPRESSOR) 25 MG tablet Take 25 mg by mouth 2 (two) times daily.   Not Taking at Unknown time  . QUEtiapine (SEROQUEL) 100 MG tablet Take 1 tablet (100 mg total) by mouth at bedtime. (Patient not taking: Reported on 03/19/2016) 30 tablet 0 Not Taking at Unknown time  . QUEtiapine (SEROQUEL) 50 MG tablet Take 1 tablet (50 mg total) by mouth daily. (Patient not taking: Reported on 03/19/2016) 30 tablet 0 Not Taking at Unknown time  . zolpidem (AMBIEN) 5  MG tablet Take 1 tablet (5 mg total) by mouth at bedtime as needed for sleep. (Patient not taking: Reported on 03/19/2016) 30 tablet 0 Not Taking at Unknown time    Treatment Modalities: Medication Management, Group therapy, Case management,  1 to 1 session with clinician, Psychoeducation, Recreational therapy.   Physician Treatment Plan for Primary Diagnosis: Paranoid schizophrenia ([name]) Long Term Goal(s): Improvement in symptoms so as ready for discharge  Short Term Goals: Ability to identify changes in lifestyle to reduce recurrence of condition will improve  Medication Management: Evaluate patient's response, side effects, and tolerance of medication regimen.  Therapeutic Interventions: 1 to 1 sessions, Unit Group sessions and Medication administration.  Evaluation of Outcomes: Not Met  Physician Treatment Plan for Secondary Diagnosis: Principal Problem:   Paranoid schizophrenia ([name])   Long Term Goal(s): Improvement in symptoms so as ready for discharge  Short Term Goals: Ability to identify and develop effective coping behaviors will improve  Medication Management: Evaluate patient's response, side effects, and tolerance of medication regimen.  Therapeutic Interventions: 1 to 1 sessions, Unit Group sessions and Medication administration.  Evaluation of Outcomes: Not Met  11/2: Patient today seen as disorganized, delusional - will change his clozaril to [place] - given his inability to be compliant with labs as well as taking his medications , and given the fact that he is going to return to his independent living situation with case management available . Will discontinue  Clozaril for inability to be compliant with labs. Will start a trial of Invega 3 mg po qhs for psychosis. Will offer Invega sustenna IM prior to discharge. Pt agrees with plan. Will continue Depakote , change to ER 1000 mg po qhs for mood sx. Depakote level  - 03/25/16. Will discontinue Trazodone for lack of  efficacy. Start Doxepin 10 mg po qhs for sleep.  11/7:  Will continue Invega  12 mg po qhs for psychosis. Will offer Invega sustenna IM prior to discharge. Pt agrees with plan. Increased  Depakote  ER to 1250 mg po qhs for mood lability , although Depakote level - 03/25/16- 72 ( therapeutic) ,  depakote level on 03/30/16- I have reviewed depakote level today - 74- therapeutic. Will increase  Doxepin to 50 mg po qhs for sleep. Will continue Ativan  1 mg po qhs for anxiety   11/13: Patient today seen as minimizing his sx, continues to be grandiose , delusional , has sleep issues . Started on a second antipsychotic yesterday to augment the effect of Invega . Will reduce Invega to 9 mg po qhs for psychosis. Will continue Geodon 40 mg po bid with meals for augmenting the effect of Invega . Increased  Depakote  ER to 1250 mg  po qhs for mood lability , although Depakote level - 03/25/16- 72 ( therapeutic) ,  depakote level on 03/30/16- I have reviewed depakote level today - 74- therapeutic. Will discontinue  Doxepin for lack of efficacy. Start Elavil 25 mg po qhs for sleep. Will continue Ativan  1 mg po qhs for anxiety   11/16:Patient continues to be disorganized ,continues to have delusional thoughts , irrelevant on and off and continues to have  inappropriate laughter.  Will increase  Clozaril to 12.5 mg po daily and clozaril 50 mg po qhs for psychosis. Reviewed CBC with diff - 04/06/16- ANC- 8.6 /WBC- 12.8  wnl.needs weekly CBC/ANC. Will taper off  Geodon for lack of efficacy. Will continue  Depakote  ER to 1250 mg po qhs for mood lability , although Depakote level - 03/25/16- 72 ( therapeutic) ,  depakote level on 03/30/16- I have reviewed depakote level  - 74- therapeutic. Will continue Restoril 15 mg po qhs for sleep.  11/21: Today presents as irrelevant, delusional, refused AM meds  Will continue  Clozaril  12.5 mg po daily and increase clozaril to 100 mg po qhs for psychosis. Reviewed CBC  with diff - 04/06/16- ANC- 8.6 /WBC- 12.8  wnl.needs weekly CBC/ANC- 04/13/16- pt refused labs - will reorder. Reduced Depakote  ER to 1000 mg po qhs for mood lability , although Depakote level - 03/25/16- 72 ( therapeutic) ,  depakote level on 03/30/16- I have reviewed depakote level  - 74- therapeutic.Dose reduced to address his tiredness. Will get another depakote level on 04/16/16.  11/24: Presentation remains much the same.  Will continue  Clozaril  12.5 mg po daily and increase clozaril to 125 mg po qhs for psychosis.   Reviewed CBC with diff - 04/06/16- ANC- 8.6 /WBC- 12.8  wnl.needs weekly CBC/ANC- 04/13/16-ANC - 8/WBC-12.2 Reduced Depakote  ER to 1000 mg po qhs for mood lability , although Depakote level - 03/25/16- 72 ( therapeutic) ,  depakote level on 03/30/16- I have reviewed depakote level  - 74- therapeutic.Dose reduced to address his tiredness. Will get another depakote level on 04/16/16. Will continue Restoril 15 mg po qhs for sleep.   RN Treatment Plan for Primary Diagnosis: Paranoid schizophrenia (HCC) Long Term Goal(s): Knowledge of disease and therapeutic regimen to maintain health will improve  Short Term Goals: Compliance with prescribed medications will improve  Medication Management: RN will administer medications as ordered by provider, will assess and evaluate patient's response and provide education to patient for prescribed medication. RN will report any adverse and/or side effects to prescribing provider.  Therapeutic Interventions: 1 on 1 counseling sessions, Psychoeducation, Medication administration, Evaluate responses to treatment, Monitor vital signs and CBGs as ordered, Perform/monitor CIWA, COWS, AIMS and Fall Risk screenings as ordered, Perform wound care treatments as ordered.  Evaluation of Outcomes: Progressing   LCSW Treatment Plan for Primary Diagnosis: Paranoid schizophrenia (HCC) Long Term Goal(s): Safe transition to appropriate next level of care  at discharge, Engage patient in therapeutic group addressing interpersonal concerns.  Short Term Goals: Engage patient in aftercare planning with referrals and resources  Therapeutic Interventions: Assess for all discharge needs, 1 to 1 time with Social worker, Explore available resources and support systems, Assess for adequacy in community support network, Educate family and significant other(s) on suicide prevention, Complete Psychosocial Assessment, Interpersonal group therapy.  Evaluation of Outcomes: Progressing  CSW left message for Melody Cherlyn Cushing transition coordinator at [phone]. 11/2:  Shelly Coss does not support referral to [facility].  However, they are requesting referral to [facility] as a woman with whom Colon had a good relationship with in the past has transferred to that agency.  As noted above, we are trying to switch him to injectable due to recent history of non-compliance with meds, and the fact that he will not be monitored daily. 11/7:  [facility] came to see patient.  Unable to complete assessment due to delusions, disorganization 11/13: Pt visited by [facility] peer support last week-they report he is far from baseline previous to first of three hospitalizations that began in October, and that he appears incapable of living independently.  Venetia Constable has agreed that if he does not progress under our care, they will support [facility] referral 11/16: Pt is on the [facility] wait list. CSW to begin PASSR process this week for placement despite the lack of progress addressing mental health symptoms 11/21: Pt was visited by Alphonzo Dublin [facility] manager.  Her feedback is that irrelevence and disorganization is not an issue, but inability to agree that he agrees to go to [facility] is an issue, and he insists that he will return to [employer] at d/c.  "I own the whole town. I can stay anywhere I want."  Progress in Treatment: Attending groups: No Participating in groups: No Taking medication as  prescribed: Yes Toleration medication: Yes, no side effects reported at this time Family/Significant other contact made: Yes  See above Patient understands diagnosis: No  Limited insight Discussing patient identified problems/goals with staff: Yes Medical problems stabilized or resolved: Yes Denies suicidal/homicidal ideation: Yes Issues/concerns per patient self-inventory: None Other: N/A  New problem(s) identified: None identified at this time.   New Short Term/Long Term Goal(s): None identified at this time.   Discharge Plan or Barriers: See above  Reason for Continuation of Hospitalization: Disorganization Medication stabilization Delusions   Estimated Length of Stay: 3-5 days  Attendees: Patient: 04/16/2016  11:27 AM  Physician: Ursula Alert, MD 04/16/2016  11:27 AM  Nursing: Grayland Ormond, RN 04/16/2016  11:27 AM  RN Care Manager: Lars Pinks, RN 04/16/2016  11:27 AM  Social Worker: Ripley Fraise 04/16/2016  11:27 AM  Recreational Therapist: Marjette  04/16/2016  11:27 AM  Other: Norberto Sorenson 04/16/2016  11:27 AM  Other:  04/16/2016  11:27 AM    Scribe for Treatment Team:  Roque Lias LCSW 04/16/2016 11:27 AM

## 2016-04-16 NOTE — Progress Notes (Signed)
DAR NOTE: Patient presents with anxious affect and depressed mood.  Denies pain, auditory and visual hallucinations.  Rates depression at 0, hopelessness at 0, and anxiety at 0.  Maintained on routine safety checks.  Medications given as prescribed.  Support and encouragement offered as needed.  Attended group and participated.   Patient observed socializing with peers in the dayroom.  Patient still preoccupied with getting discharge.  Patient redirected several times regarding discharge.

## 2016-04-16 NOTE — Progress Notes (Signed)
Recreation Therapy Notes  Date: 04/16/16 Time: 1000 Location: 500 Hall Dayroom  Group Topic: Communication, Team Building, Problem Solving  Goal Area(s) Addresses:  Patient will effectively work with peer towards shared goal.  Patient will identify skills used to make activity successful.  Patient will identify how skills used during activity can be used to reach post d/c goals.   Behavioral Response: Engaged  Intervention: STEM Activity  Activity: Stage managerLanding Pad. In teams patients were given 12 plastic drinking straws and a length of masking tape. Using the materials provided patients were asked to build a landing pad to catch a golf ball dropped from approximately 6 feet in the air.   Education: Pharmacist, communityocial Skills, Discharge Planning   Education Outcome: Acknowledges education/In group clarification offered/Needs additional education.   Clinical Observations/Feedback: Pt was active and social with peers.  Pt came up some ideas for his group.  Pt worked well with his peers.  Pt left early and did not return.  Caroll RancherMarjette De Libman, LRT/CTRS      Lillia AbedLindsay, Jeremian Whitby A 04/16/2016 12:12 PM

## 2016-04-17 LAB — GLUCOSE, CAPILLARY
GLUCOSE-CAPILLARY: 143 mg/dL — AB (ref 65–99)
Glucose-Capillary: 146 mg/dL — ABNORMAL HIGH (ref 65–99)
Glucose-Capillary: 76 mg/dL (ref 65–99)
Glucose-Capillary: 174 mg/dL — ABNORMAL HIGH (ref 65–99)

## 2016-04-17 LAB — VALPROIC ACID LEVEL: Valproic Acid Lvl: 45 ug/mL — ABNORMAL LOW (ref 50.0–100.0)

## 2016-04-17 NOTE — Progress Notes (Signed)
D:  Patient's self inventory sheet, patient sleeps good, sleep medication is helpful.  Good appetite, hyper energy level, good concentration.  Denied depression, hopeless and anxiety.  Withdrawals, but did not check any blocks on sheet.  Denied SI.  Then checked "almost all the time".  Physical problems yes, then stated no problems  Pain medication is helpful.  No goal.  No plan.  Does have discharge plans. A:  Medications administered per MD orders.  Emotional support and encouragement given patient. R:  Denied SI and HI while talking to nurse this morning, contracts for safety.  Denied A/V hallucinations.  Safety maintained with 15 minute checks.

## 2016-04-17 NOTE — Progress Notes (Signed)
The focus of this group is to educate the patient on the purpose and policies of crisis stabilization and provide a format to answer questions about their admission.  The group details unit policies and expectations of patients while admitted.  Patient attended 0900 nurse education orientation group this morning.  Patient actively participated and had appropriate affect.  Patient was alert.  Patient had appropriate insight and appropriate engagement.  Today patient will work on 3 goals for discharge. Patient stated he wants to be discharged and have his own place to live.

## 2016-04-17 NOTE — Progress Notes (Signed)
Patient did not attend wrap up group. 

## 2016-04-17 NOTE — [facility] Group Notes (Signed)
[facility] Group Notes: (Clinical Social Work)   04/17/2016      Type of Therapy:  Group Therapy   Participation Level:  Did Not Attend despite MHT prompting - he came in with only 1-2 minutes left to go and did participate in the last little exercise, then started singing while others joined in with him.   Ambrose MantleMareida Grossman-Orr, LCSW 04/17/2016, 1:39 PM

## 2016-04-17 NOTE — Progress Notes (Signed)
D: Pt denies SI/HI/AVh. Pt is pleasant and cooperative. Pt visible on the milieu and stated he had a good day  A: Pt was offered support and encouragement. Pt was given scheduled medications. Pt was encourage to attend groups. Q 15 minute checks were done for safety.   R:Pt attends groups and interacts well with peers and staff. Pt is taking medication. Pt has no complaints.Pt receptive to treatment and safety maintained on unit.

## 2016-04-17 NOTE — Plan of Care (Signed)
Problem: Education: Goal: Knowledge of the prescribed therapeutic regimen will improve Outcome: Progressing Nurse discussed depression/coping skills with paient.

## 2016-04-17 NOTE — Progress Notes (Signed)
[facility] MD Progress Note  04/17/2016 11:07 AM Ronald Roman  MRN:  [id number]   Subjective: Patient reports that there is not change from how he was doing yesterday.  He smiles appropriately  Objective: Ronald Roman is a 55 year old AA male with history ofparanoid schizophrenia, DM, HTN,who was found walking naked by neighbors, recommended by his case manager to be brought to the hospital for evaluation.  Patient seen and chart reviewed.Discussed patient with treatment team.  Patient today seen awake and alert . Pt continues to be delusional and grandiose - continues to make irrelevant statements - continues to need redirection . Pt has been tolerating Clozaril well , denies ADRs. Pt continues to be on [facility] waitlist - for higher level of care.  Principal Problem: Paranoid schizophrenia (HCC)  Diagnosis:   Patient Active Problem List   Diagnosis Date Noted  . Acute psychosis [F23]   . Leukocytosis [D72.829] 02/06/2015  . Sleep apnea [G47.30] 02/05/2015  . AKI (acute kidney injury) (HCC) [N17.9] 02/04/2015  . Seizure disorder (HCC) [G40.909] 02/04/2015  . Acute encephalopathy [G93.40] 02/04/2015  . Diabetes mellitus without complication (HCC) [E11.9]   . Hyperlipidemia [E78.5]   . Hypertension [I10]   . Paranoid schizophrenia (HCC) [F20.0]    Total Time spent with patient: 15 minutes  Past Psychiatric History: Please see H&P.  Past Medical History:  Past Medical History:  Diagnosis Date  . Diabetes mellitus without complication (HCC)   . GERD (gastroesophageal reflux disease)   . Hyperlipidemia   . Hypertension   . Paranoid schizophrenia (HCC)   . Sleep apnea   . Uric acid nephrolithiasis    History reviewed. No pertinent surgical history.  Family History: Patient denies  Family Psychiatric  History: Pt denies  Social History: Patient reports he lives in [place] , is single , has children who does not live with him , reports he gets food stamps . History   Alcohol Use No     History  Drug Use No    Social History   Social History  . Marital status: Single    Spouse name: N/A  . Number of children: N/A  . Years of education: N/A   Social History Main Topics  . Smoking status: Current Every Day Smoker    Packs/day: 1.00    Types: Cigarettes  . Smokeless tobacco: Never Used  . Alcohol use No  . Drug use: No  . Sexual activity: Not Currently   Other Topics Concern  . None   Social History Narrative  . None   Additional Social History:   Sleep: Fair  Appetite:  Fair  Current Medications: Current Facility-Administered Medications  Medication Dose Route Frequency Provider Last Rate Last Dose  . acetaminophen (TYLENOL) tablet 650 mg  650 mg Oral Q6H PRN Kristeen Mans, NP   650 mg at 03/21/16 0329  . amantadine (SYMMETREL) capsule 100 mg  100 mg Oral BID Laveda Abbe, NP   100 mg at 04/17/16 0747  . cloZAPine (CLOZARIL) tablet 12.5 mg  12.5 mg Oral Daily Saramma Eappen, MD   12.5 mg at 04/17/16 0747  . cloZAPine (CLOZARIL) tablet 125 mg  125 mg Oral QHS Jomarie Longs, MD   125 mg at 04/16/16 2125  . divalproex (DEPAKOTE ER) 24 hr tablet 1,250 mg  1,250 mg Oral QHS Jomarie Longs, MD   1,250 mg at 04/16/16 2124  . insulin aspart (novoLOG) injection 0-15 Units  0-15 Units Subcutaneous TID WC Saramma  Eappen, MD   2 Units at 04/17/16 [phone]  . insulin aspart (novoLOG) injection 0-5 Units  0-5 Units Subcutaneous QHS Jomarie Longs, MD   2 Units at 04/15/16 2040  . insulin aspart (novoLOG) injection 4 Units  4 Units Subcutaneous TID WC Jomarie Longs, MD   4 Units at 04/17/16 0615  . insulin glargine (LANTUS) injection 20 Units  20 Units Subcutaneous QHS Jomarie Longs, MD   20 Units at 04/16/16 2125  . lisinopril (PRINIVIL,ZESTRIL) tablet 10 mg  10 mg Oral Daily Kristeen Mans, NP   10 mg at 04/17/16 0747  . nicotine polacrilex (NICORETTE) gum 2 mg  2 mg Oral PRN Jomarie Longs, MD   2 mg at 04/17/16 0748  . OLANZapine  (ZYPREXA) tablet 10 mg  10 mg Oral BID PRN Jomarie Longs, MD   10 mg at 04/08/16 0839   Or  . OLANZapine (ZYPREXA) injection 10 mg  10 mg Intramuscular BID PRN Jomarie Longs, MD      . temazepam (RESTORIL) capsule 15 mg  15 mg Oral QHS Jomarie Longs, MD   15 mg at 04/16/16 2125   Lab Results:  Results for orders placed or performed during the hospital encounter of 03/19/16 (from the past 48 hour(s))  Glucose, capillary     Status: Abnormal   Collection Time: 04/15/16 11:58 AM  Result Value Ref Range   Glucose-Capillary 166 (H) 65 - 99 mg/dL  Glucose, capillary     Status: Abnormal   Collection Time: 04/15/16  4:53 PM  Result Value Ref Range   Glucose-Capillary 179 (H) 65 - 99 mg/dL  Glucose, capillary     Status: Abnormal   Collection Time: 04/15/16  8:23 PM  Result Value Ref Range   Glucose-Capillary 241 (H) 65 - 99 mg/dL  Glucose, capillary     Status: Abnormal   Collection Time: 04/16/16  6:17 AM  Result Value Ref Range   Glucose-Capillary 179 (H) 65 - 99 mg/dL  Glucose, capillary     Status: None   Collection Time: 04/16/16 11:46 AM  Result Value Ref Range   Glucose-Capillary 97 65 - 99 mg/dL  Glucose, capillary     Status: Abnormal   Collection Time: 04/16/16  4:47 PM  Result Value Ref Range   Glucose-Capillary 198 (H) 65 - 99 mg/dL  Glucose, capillary     Status: Abnormal   Collection Time: 04/16/16  8:50 PM  Result Value Ref Range   Glucose-Capillary 174 (H) 65 - 99 mg/dL  Glucose, capillary     Status: Abnormal   Collection Time: 04/17/16  6:10 AM  Result Value Ref Range   Glucose-Capillary 146 (H) 65 - 99 mg/dL   Blood Alcohol level:  Lab Results  Component Value Date   ETH <5 03/18/2016   ETH <5 03/01/2016   Metabolic Disorder Labs: Lab Results  Component Value Date   HGBA1C 8.0 (H) 03/23/2016   MPG 183 03/23/2016   MPG 183 03/20/2016   Lab Results  Component Value Date   PROLACTIN 31.5 (H) 03/23/2016   PROLACTIN 36.6 (H) 03/20/2016   Lab Results   Component Value Date   CHOL 145 03/23/2016   TRIG 144 03/23/2016   HDL 30 (L) 03/23/2016   CHOLHDL 4.8 03/23/2016   VLDL 29 03/23/2016   LDLCALC 86 03/23/2016   LDLCALC 21 03/20/2016   Physical Findings: AIMS: Facial and Oral Movements Muscles of Facial Expression: None, normal Lips and Perioral Area: None, normal Jaw: None, normal Tongue:  None, normal,Extremity Movements Upper (arms, wrists, hands, fingers): None, normal Lower (legs, knees, ankles, toes): None, normal, Trunk Movements Neck, shoulders, hips: None, normal, Overall Severity Severity of abnormal movements (highest score from questions above): None, normal Incapacitation due to abnormal movements: None, normal Patient's awareness of abnormal movements (rate only patient's report): No Awareness, Dental Status Current problems with teeth and/or dentures?: No Does patient usually wear dentures?: No  CIWA:  CIWA-Ar Total: 1 COWS:  COWS Total Score: 3  Musculoskeletal: Strength & Muscle Tone: within normal limits Gait & Station: normal Patient leans: N/A   Psychiatric Specialty Exam: Physical Exam  Nursing note and vitals reviewed.   Review of Systems  Psychiatric/Behavioral: The patient is nervous/anxious.   All other systems reviewed and are negative.   Blood pressure (!) 144/83, pulse (!) 102, temperature 98.1 F (36.7 C), temperature source Oral, resp. rate 18, height 5' 5.5" (1.664 m), weight 91.6 kg (202 lb), SpO2 100 %.Body mass index is 33.1 kg/m.  General Appearance: Guarded  Eye Contact:  Fair  Speech:  Normal Rate   Volume:  Decreased  Mood:  Anxious and Dysphoric vaguely irritable  Affect:  Inappropriate on and off   Thought Process:  Disorganized, Irrelevant and Descriptions of Associations: Tangential with some improvement   Orientation:  Full (Time, Place, and Person)  Thought Content:  Delusions, Paranoid Ideation and Tangential   Suicidal Thoughts:  No observed patient to be disorganized  and delusional intermittent  Homicidal Thoughts:  No  Memory:  Immediate;   Fair Recent;   Poor Remote;   Poor  Judgement:  Impaired  Insight:  Shallow  Psychomotor Activity:  Restlessness  Concentration:  Concentration: Fair and Attention Span: Fair  Recall:  [employer]ervFair  Fund of Knowledge:  Fair  Language:  Fair  Akathisia:  No  Handed:  Right  AIMS (if indicated):     Assets:  Desire for Improvement  ADL's:  Intact  Cognition:  WNL  Sleep:  Number of Hours: 6   03/31/16 CSW was able to obtain collateral information from his community support - who reported patient's baseline a few months ago was much better , he does have chronic delusions , but he was abel to take care of self and participate better in decision making discussions .   Treatment Plan Summary: Ronald Roman is a 55 year old AA male with history ofparanoid schizophrenia, DM, HTN,who was found walking naked by neighbors, recommended by his case manager to be brought to the hospital for evaluation.  Patient today continues to be vaguely irritable , irrelevant , delusional - tolerating his medications. Awaiting [facility] placement.    Paranoid schizophrenia (HCC) currently unstable.  Will continue today 04/17/16  plan as below except where it is noted.  Daily contact with patient to assess and evaluate symptoms and progress in treatment and Medication management    Will continue  Clozaril  12.5 mg po daily and increase clozaril to 125 mg po qhs for psychosis.    Reviewed CBC with diff - 04/06/16- ANC- 8.6 /WBC- 12.8  wnl.needs weekly CBC/ANC- 04/13/16-ANC - 8/WBC-12.2  Continue Depakote  ER 1000 mg po qhs for mood lability , although  Depakote level  - 03/25/16- 72 ( therapeutic) ,  depakote level on 03/30/16- I have reviewed depakote level  - 74- therapeutic.Dose reduced to address his tiredness. Ordered another depakote level for today 04/17/16.  Will continue Restoril 15 mg po qhs for sleep.  Will continue to monitor  vitals ,  medication compliance and treatment side effects while patient is here.   Reviewed labs: EKG - qtc - wnl, TSH- wnl , hba1c- 8- dietician consult once patient is more stable  ,  pl -31.5 ,  lipid panel HDL - low - diet control recommendations to be provided when patient is more stable.  CSW will continue  working on disposition. [facility].Pt is on their wait list.  Patient to participate in therapeutic milieu  Kadedra Vanaken May Melvinia Ashby, NP -[facility] 04/17/2016, 11:07 AM

## 2016-04-18 LAB — GLUCOSE, CAPILLARY
Glucose-Capillary: 139 mg/dL — ABNORMAL HIGH (ref 65–99)
Glucose-Capillary: 177 mg/dL — ABNORMAL HIGH (ref 65–99)
Glucose-Capillary: 150 mg/dL — ABNORMAL HIGH (ref 65–99)
Glucose-Capillary: 207 mg/dL — ABNORMAL HIGH (ref 65–99)

## 2016-04-18 NOTE — Progress Notes (Signed)
D: Patient's self inventory sheet: patient has good sleep, recieved sleep medication.good  Appetite, hyper energy level, good concentration. Rated depression 0/10, hopeless 0/10, anxiety 0/10. SI/HI/AVH: Reported on inventory sheet that he thinks about hurting himself almost all of the time. Physical complaints are denied. Goal is "0". Plans to work on "0". Pt pacing the unit, rearranged all of the chairs in the dayroom during both groups. Approached staff numerous times asking for his shoes and belongings so that he could leave; irritable when told that he is not being discharged today. Minimal interaction with peers.  A: Medications administered, assessed medication knowledge and education given on medication regimen.  Emotional support and encouragement given patient. R: Reports  SI and denies HI , contracts for safety. Safety maintained with 15 minute checks.

## 2016-04-18 NOTE — Progress Notes (Signed)
Patient did not attend wrap up group. 

## 2016-04-18 NOTE — Progress Notes (Signed)
[facility]  04/18/2016 2:34 PM Ronald Roman  MRN:  [id number]   Subjective: Patient doing well.  Walking in hallway.  Non disruptive to unit.  Objective: Ronald AngstBarry D Roman is a 55 year old AA male with history ofparanoid schizophrenia, DM, HTN,who was found walking naked by neighbors, recommended by his case manager to be brought to the hospital for evaluation.  Patient seen and chart reviewed.Discussed patient with treatment team.  Patient today seen awake and alert . Pt continues to be delusional and grandiose - continues to make irrelevant statements - continues to need redirection . Pt has been tolerating Clozaril well , denies ADRs. [facility]  Principal Problem: Paranoid schizophrenia (HCC)  Diagnosis:   Patient Active Problem List   Diagnosis Date Noted  . Acute psychosis [F23]   . Leukocytosis [D72.829] 02/06/2015  . Sleep apnea [G47.30] 02/05/2015  . AKI (acute kidney injury) (HCC) [N17.9] 02/04/2015  . Seizure disorder (HCC) [G40.909] 02/04/2015  . Acute encephalopathy [G93.40] 02/04/2015  . Diabetes mellitus without complication (HCC) [E11.9]   . Hyperlipidemia [E78.5]   . Hypertension [I10]   . Paranoid schizophrenia (HCC) [F20.0]    Total Time spent with patient: 15 minutes  Past Psychiatric History: Please see H&P.  Past Medical History:  Past Medical History:  Diagnosis Date  . Diabetes mellitus without complication (HCC)   . GERD (gastroesophageal reflux disease)   . Hyperlipidemia   . Hypertension   . Paranoid schizophrenia (HCC)   . Sleep apnea   . Uric acid nephrolithiasis    History reviewed. No pertinent surgical history.  Family History: Patient denies  Family Psychiatric  History: Pt denies  Social History: Patient reports he lives in [place] , is single , has children who does not live with him , reports he gets food stamps . History  Alcohol Use No     History  Drug  Use No    Social History   Social History  . Marital status: Single    Spouse name: N/A  . Number of children: N/A  . Years of education: N/A   Social History Main Topics  . Smoking status: Current Every Day Smoker    Packs/day: 1.00    Types: Cigarettes  . Smokeless tobacco: Never Used  . Alcohol use No  . Drug use: No  . Sexual activity: Not Currently   Other Topics Concern  . None   Social History Narrative  . None   Additional Social History:   Sleep: Fair  Appetite:  Fair  Current Medications: Current Facility-Administered Medications  Medication Dose Route Frequency Provider Last Rate Last Dose  . acetaminophen (TYLENOL) tablet 650 mg  650 mg Oral Q6H PRN Kristeen MansFran E Hobson, NP   650 mg at 03/21/16 0329  . amantadine (SYMMETREL) capsule 100 mg  100 mg Oral BID Laveda AbbeLaurie Britton Parks, NP   100 mg at 04/18/16 0756  . cloZAPine (CLOZARIL) tablet 12.5 mg  12.5 mg Oral Daily Saramma Eappen, MD   12.5 mg at 04/18/16 0756  . cloZAPine (CLOZARIL) tablet 125 mg  125 mg Oral QHS Jomarie LongsSaramma Eappen, MD   125 mg at 04/17/16 2138  . divalproex (DEPAKOTE ER) 24 hr tablet 1,250 mg  1,250 mg Oral QHS Jomarie LongsSaramma Eappen, MD   1,250 mg at 04/17/16 2138  . insulin aspart (novoLOG) injection 0-15 Units  0-15 Units Subcutaneous TID WC Jomarie LongsSaramma Eappen, MD   3  Units at 04/18/16 1203  . insulin aspart (novoLOG) injection 0-5 Units  0-5 Units Subcutaneous QHS Jomarie Longs, MD   2 Units at 04/15/16 2040  . insulin aspart (novoLOG) injection 4 Units  4 Units Subcutaneous TID WC Jomarie Longs, MD   4 Units at 04/18/16 1204  . insulin glargine (LANTUS) injection 20 Units  20 Units Subcutaneous QHS Jomarie Longs, MD   20 Units at 04/17/16 2211  . lisinopril (PRINIVIL,ZESTRIL) tablet 10 mg  10 mg Oral Daily Kristeen Mans, NP   10 mg at 04/18/16 0756  . nicotine polacrilex (NICORETTE) gum 2 mg  2 mg Oral PRN Jomarie Longs, MD   2 mg at 04/17/16 0748  . OLANZapine (ZYPREXA) tablet 10 mg  10 mg Oral BID PRN  Jomarie Longs, MD   10 mg at 04/08/16 0839   Or  . OLANZapine (ZYPREXA) injection 10 mg  10 mg Intramuscular BID PRN Jomarie Longs, MD      . temazepam (RESTORIL) capsule 15 mg  15 mg Oral QHS Jomarie Longs, MD   15 mg at 04/17/16 2138   Lab Results:  Results for orders placed or performed during the hospital encounter of 03/19/16 (from the past 48 hour(s))  Glucose, capillary     Status: Abnormal   Collection Time: 04/16/16  4:47 PM  Result Value Ref Range   Glucose-Capillary 198 (H) 65 - 99 mg/dL  Glucose, capillary     Status: Abnormal   Collection Time: 04/16/16  8:50 PM  Result Value Ref Range   Glucose-Capillary 174 (H) 65 - 99 mg/dL  Glucose, capillary     Status: Abnormal   Collection Time: 04/17/16  6:10 AM  Result Value Ref Range   Glucose-Capillary 146 (H) 65 - 99 mg/dL  Glucose, capillary     Status: None   Collection Time: 04/17/16 11:34 AM  Result Value Ref Range   Glucose-Capillary 76 65 - 99 mg/dL  Glucose, capillary     Status: Abnormal   Collection Time: 04/17/16  4:43 PM  Result Value Ref Range   Glucose-Capillary 143 (H) 65 - 99 mg/dL  Valproic acid level     Status: Abnormal   Collection Time: 04/17/16  6:48 PM  Result Value Ref Range   Valproic Acid Lvl 45 (L) 50.0 - 100.0 ug/mL    Comment: Performed at [facility]  Glucose, capillary     Status: Abnormal   Collection Time: 04/17/16  9:10 PM  Result Value Ref Range   Glucose-Capillary 174 (H) 65 - 99 mg/dL  Glucose, capillary     Status: Abnormal   Collection Time: 04/18/16  6:01 AM  Result Value Ref Range   Glucose-Capillary 139 (H) 65 - 99 mg/dL   Comment 1 Notify RN   Glucose, capillary     Status: Abnormal   Collection Time: 04/18/16 11:55 AM  Result Value Ref Range   Glucose-Capillary 177 (H) 65 - 99 mg/dL   Comment 1 Notify RN    Comment 2 Document in Chart    Blood Alcohol level:  Lab Results  Component Value Date   ETH <5 03/18/2016   ETH <5 03/01/2016    Metabolic Disorder Labs: Lab Results  Component Value Date   HGBA1C 8.0 (H) 03/23/2016   MPG 183 03/23/2016   MPG 183 03/20/2016   Lab Results  Component Value Date   PROLACTIN 31.5 (H) 03/23/2016   PROLACTIN 36.6 (H) 03/20/2016   Lab Results  Component Value Date  CHOL 145 03/23/2016   TRIG 144 03/23/2016   HDL 30 (L) 03/23/2016   CHOLHDL 4.8 03/23/2016   VLDL 29 03/23/2016   LDLCALC 86 03/23/2016   LDLCALC 21 03/20/2016   Physical Findings: AIMS: Facial and Oral Movements Muscles of Facial Expression: None, normal Lips and Perioral Area: None, normal Jaw: None, normal Tongue: None, normal,Extremity Movements Upper (arms, wrists, hands, fingers): None, normal Lower (legs, knees, ankles, toes): None, normal, Trunk Movements Neck, shoulders, hips: None, normal, Overall Severity Severity of abnormal movements (highest score from questions above): None, normal Incapacitation due to abnormal movements: None, normal Patient's awareness of abnormal movements (rate only patient's report): No Awareness, Dental Status Current problems with teeth and/or dentures?: No Does patient usually wear dentures?: No  CIWA:  CIWA-Ar Total: 1 COWS:  COWS Total Score: 3  Musculoskeletal: Strength & Muscle Tone: within normal limits Gait & Station: normal Patient leans: N/A   Psychiatric Specialty Exam: Physical Exam  Nursing note and vitals reviewed.   Review of Systems  Psychiatric/Behavioral: The patient is nervous/anxious.   All other systems reviewed and are negative.   Blood pressure 128/67, pulse (!) 105, temperature 97.9 F (36.6 C), temperature source Oral, resp. rate 16, height 5' 5.5" (1.664 m), weight 91.6 kg (202 lb), SpO2 100 %.Body mass index is 33.1 kg/m.  General Appearance: Guarded  Eye Contact:  Fair  Speech:  Normal Rate   Volume:  Decreased  Mood:  Anxious and Dysphoric vaguely irritable  Affect:  Inappropriate on and off   Thought Process:   Disorganized, Irrelevant and Descriptions of Associations: Tangential with some improvement   Orientation:  Full (Time, Place, and Person)  Thought Content:  Delusions, Paranoid Ideation and Tangential   Suicidal Thoughts:  No observed patient to be disorganized and delusional intermittent  Homicidal Thoughts:  No  Memory:  Immediate;   Fair Recent;   Poor Remote;   Poor  Judgement:  Impaired  Insight:  Shallow  Psychomotor Activity:  Restlessness  Concentration:  Concentration: Fair and Attention Span: Fair  Recall:  [employer]ervFair  Fund of Knowledge:  Fair  Language:  Fair  Akathisia:  No  Handed:  Right  AIMS (if indicated):     Assets:  Desire for Improvement  ADL's:  Intact  Cognition:  WNL  Sleep:  Number of Hours: 6   03/31/16 CSW was able to obtain collateral information from his community support - who reported patient's baseline a few months ago was much better , he does have chronic delusions , but he was abel to take care of self and participate better in decision making discussions .   Treatment Plan Summary: Ronald Roman is a 55 year old AA male with history ofparanoid schizophrenia, DM, HTN,who was found walking naked by neighbors, recommended by his case manager to be brought to the hospital for evaluation.  Patient today continues to be vaguely irritable , irrelevant , delusional - tolerating his medications. Awaiting [facility] placement.    Paranoid schizophrenia (HCC) currently unstable.  Will continue today 04/18/16  plan as below except where it is noted.  Daily contact with patient to assess and evaluate symptoms and progress in treatment and Medication management    Will continue  Clozaril  12.5 mg po daily and increase clozaril to 125 mg po qhs for psychosis.   Reviewed CBC with diff - 04/06/16- ANC- 8.6 /WBC- 12.8  wnl.needs weekly CBC/ANC- 04/13/16-ANC - 8/WBC-12.2  Continue Depakote  ER 1000 mg po qhs for  mood lability , although  Depakote level  - 03/25/16-  72 ( therapeutic) ,  depakote level on 03/30/16- I have reviewed depakote level  - 74- therapeutic.Dose reduced to address his tiredness. Ordered another depakote level for today 04/17/16.  Will continue Restoril 15 mg po qhs for sleep.  Will continue to monitor vitals ,medication compliance and treatment side effects while patient is here.   Reviewed labs: EKG - qtc - wnl, TSH- wnl , hba1c- 8- dietician consult once patient is more stable  ,  pl -31.5 ,  lipid panel HDL - low - diet control recommendations to be provided when patient is more stable.  CSW will continue  working on disposition. Patient  referred to [facility] for further level of care.Pt is on their wait list.  Patient to participate in therapeutic milieu  Lindwood Qua, NP [facility] 04/18/2016, 2:34 PM

## 2016-04-18 NOTE — Progress Notes (Signed)
D: Pt denies SI/HI/AV. Pt is pleasant and cooperative. Pt stated he was leaving tonight, pt grandiose and delusional , stated he was going to his brand new house he just bought and that someone told him he was leaving tonight.   A: Pt was offered support and encouragement. Pt was given scheduled medications. Pt was encourage to attend groups. Q 15 minute checks were done for safety.   R:Pt attends groups and interacts well with peers and staff. Pt is taking medication. Pt has no complaints.Pt receptive to treatment and safety maintained on unit.

## 2016-04-18 NOTE — [facility] Group Notes (Signed)
[facility] Group Notes:  (Clinical Social Work)  04/18/2016  11:00AM-12:00PM  Summary of Progress/Problems:  The main focus of today's process group was to listen to a variety of genres of music and to identify that different types of music provoke different responses.  The patient then was able to identify personally what was soothing for them, as well as energizing, as well as how patient can personally use this knowledge in sleep habits, with depression, and with other symptoms.  The patient expressed at the beginning of group the overall feeling of "alright."  He sang along loudly to a Christmas song and said it made him feel good because "if I can't really on anybody else, I can rely on him."  During group he insisted on rearranging the furniture in the room, would not allow anyone to help him, became irritated at offers of help.  He placed all the furniture in the middle of the room in a way that the chairs were not actually accessible.  Type of Therapy:  Music Therapy   Participation Level:  Active  Participation Quality:  Attentive   Affect:  Blunted  Cognitive:  Disorganized  Insight:  Improving  Engagement in Therapy:  Engaged  Modes of Intervention:   Activity, Exploration  Ambrose MantleMareida Grossman-Orr, LCSW 04/18/2016

## 2016-04-18 NOTE — Plan of Care (Signed)
Problem: Safety: Goal: Ability to redirect hostility and anger into socially appropriate behaviors will improve Outcome: Progressing Pt safe on the unit at this time

## 2016-04-19 LAB — GLUCOSE, CAPILLARY
GLUCOSE-CAPILLARY: 168 mg/dL — AB (ref 65–99)
Glucose-Capillary: 148 mg/dL — ABNORMAL HIGH (ref 65–99)
Glucose-Capillary: 115 mg/dL — ABNORMAL HIGH (ref 65–99)
Glucose-Capillary: 182 mg/dL — ABNORMAL HIGH (ref 65–99)

## 2016-04-19 NOTE — Progress Notes (Signed)
D: Pt denies SI/HI/AVH. Pt is pleasant and cooperative. Pt tangential, disorganized at times and delusional but will interact appropriately with peers and staff. Pt stated he was laying in his room all day.   A: Pt was offered support and encouragement. Pt was given scheduled medications. Pt was encourage to attend groups. Q 15 minute checks were done for safety.   R:Pt attends groups and interacts well with peers and staff. Pt is taking medication. Pt has no complaints.Pt receptive to treatment and safety maintained on unit.

## 2016-04-19 NOTE — [facility] Group Notes (Signed)
[facility] LCSW Group Therapy  04/19/2016 1:15 pm  Type of Therapy: Process Group Therapy  Participation Level:  Active  Participation Quality:  Appropriate  Affect:  Flat  Cognitive:  Oriented  Insight:  Improving  Engagement in Group:  Limited  Engagement in Therapy:  Limited  Modes of Intervention:  Activity, Clarification, Education, Problem-solving and Support  Summary of Progress/Problems: Today's group addressed the issue of overcoming obstacles.  Patients were asked to identify their biggest obstacle post d/c that stands in the way of their on-going success, and then problem solve as to how to manage this.  Invited.  Chose to not attend.  Daryel Geraldorth, Komal Stangelo B 04/19/2016   3:25 PM

## 2016-04-19 NOTE — Progress Notes (Signed)
Adult Psychoeducational Group Note  Date:  04/19/2016 Time:  9:23 PM  Group Topic/Focus:  Wrap-Up Group:   The focus of this group is to help patients review their daily goal of treatment and discuss progress on daily workbooks.   Participation Level:  Minimal  Participation Quality:  Inattentive  Affect:  Flat and Irritable  Cognitive:  Alert  Insight: Limited  Engagement in Group:  Poor  Modes of Intervention:  Support  Additional Comments:  Pt was not very attentive during group. Pt fell asleep. When pt was asked about his goals, pt only stated that he wanted to leave.   Jamas LavBailey, Loucile Posner W 04/19/2016, 9:23 PM

## 2016-04-19 NOTE — Progress Notes (Signed)
Recreation Therapy Notes  Date: 04/19/16 Time: 1000 Location: [address] Dayroom  Group Topic: Coping Skills  Goal Area(s) Addresses:  Pt will be able to identify positive coping skills. Pt will be able to identify the importance of coping skills. Pt will be able to identify effects of coping skills post d/c?  Behavioral Response: Engaged  Intervention: Magazines, scissors, glue sticks, Holiday representativeconstruction paper, coping skills worksheet  Activity: Patients were given a worksheet divided into five areas: diversions, cognitive, social, tension releasers and physical.  Patients were to look through the magazines and find pictures that display coping skills that could be used for each area.  If the patient couldn't find a picture for the coping skill they wanted, they could write it in.  Education: PharmacologistCoping Skills, Building control surveyorDischarge Planning.   Education Outcome: Acknowledges understanding/In group clarification offered/Needs additional education.   Clinical Observations/Feedback: Pt stated that coping skills are how you deal with people.  Pt did follow the guidelines of the activity but he was able to identify some coping skills.  Pt identified food, bowling and playing with bubbles with his children as coping skills.    Ronald RancherMarjette Cecilio Ohlrich, LRT/CTRS       Ronald RancherLindsay, Belicia Difatta A 04/19/2016 12:03 PM

## 2016-04-19 NOTE — Progress Notes (Signed)
DAR NOTE: Patient presents with anxious affect and depressed mood.  Denies pain, auditory and visual hallucinations.  Rates depression at 0, hopelessness at 0, and anxiety at 0.  Maintained on routine safety checks.  Medications given as prescribed.  Support and encouragement offered as needed.  Attended group and participated. Patient observed socializing with peers in the dayroom.  Patient remained preoccupied with discharge.

## 2016-04-19 NOTE — Progress Notes (Signed)
[facility]  04/19/2016 12:58 PM Ronald AngstBarry D Roman  MRN:  [id number]   Subjective: Patient states " I will go to [facility] , I do not want to go anywhere else."   Objective: Ronald Roman is a 55 year old AA male with history ofparanoid schizophrenia, DM, HTN,who was found walking naked by neighbors, recommended by his case manager to be brought to the hospital for evaluation.  Patient seen and chart reviewed.Discussed patient with treatment team.  Pt today seen in how bed, calm , continues to be delusional, however is not seen as responding to internal stimuli.' Pt per staff is more appropriate , however stays to self often . Pt is compliant on medications , denies ADRs. Pt reports he would like to go to a [facility] [place] - delusional that he has children there. Some time was spend educating patient - provided reassurance. Conveyed message to CS[facility]- [facility] . [facility].    Principal Problem: Paranoid schizophrenia (HCC)  Diagnosis:   Patient Active Problem List   Diagnosis Date Noted  . Acute psychosis [F23]   . Leukocytosis [D72.829] 02/06/2015  . Sleep apnea [G47.30] 02/05/2015  . AKI (acute kidney injury) (HCC) [N17.9] 02/04/2015  . Seizure disorder (HCC) [G40.909] 02/04/2015  . Acute encephalopathy [G93.40] 02/04/2015  . Diabetes mellitus without complication (HCC) [E11.9]   . Hyperlipidemia [E78.5]   . Hypertension [I10]   . Paranoid schizophrenia (HCC) [F20.0]    Total Time spent with patient: 25 minutes  Past Psychiatric History: Please see H&P.  Past Medical History:  Past Medical History:  Diagnosis Date  . Diabetes mellitus without complication (HCC)   . GERD (gastroesophageal reflux disease)   . Hyperlipidemia   . Hypertension   . Paranoid schizophrenia (HCC)   . Sleep apnea   . Uric acid nephrolithiasis    History reviewed. No pertinent surgical history.  Family History: Patient  denies  Family Psychiatric  History: Pt denies  Social History: [place] , is single , has children who does not live with him , reports he gets food stamps . History  Alcohol Use No     History  Drug Use No    Social History   Social History  . Marital status: Single    Spouse name: N/A  . Number of children: N/A  . Years of education: N/A   Social History Main Topics  . Smoking status: Current Every Day Smoker    Packs/day: 1.00    Types: Cigarettes  . Smokeless tobacco: Never Used  . Alcohol use No  . Drug use: No  . Sexual activity: Not Currently   Other Topics Concern  . None   Social History Narrative  . None   Additional Social History:   Sleep: Fair  Appetite:  Fair  Current Medications: Current Facility-Administered Medications  Medication Dose Route Frequency Provider Last Rate Last Dose  . acetaminophen (TYLENOL) tablet 650 mg  650 mg Oral Q6H PRN Kristeen MansFran E Hobson, NP   650 mg at 03/21/16 0329  . amantadine (SYMMETREL) capsule 100 mg  100 mg Oral BID Laveda AbbeLaurie Britton Parks, NP   100 mg at 04/19/16 0809  . cloZAPine (CLOZARIL) tablet 12.5 mg  12.5 mg Oral Daily Providence Stivers, MD   12.5 mg at 04/19/16 0810  . cloZAPine (CLOZARIL) tablet 125 mg  125 mg Oral QHS Jomarie LongsSaramma Samentha Perham, MD  125 mg at 04/18/16 2129  . divalproex (DEPAKOTE ER) 24 hr tablet 1,250 mg  1,250 mg Oral QHS Jomarie Longs, MD   1,250 mg at 04/18/16 2129  . insulin aspart (novoLOG) injection 0-15 Units  0-15 Units Subcutaneous TID WC Jomarie Longs, MD   2 Units at 04/19/16 0640  . insulin aspart (novoLOG) injection 0-5 Units  0-5 Units Subcutaneous QHS Jomarie Longs, MD   2 Units at 04/18/16 2131  . insulin aspart (novoLOG) injection 4 Units  4 Units Subcutaneous TID WC Jomarie Longs, MD   4 Units at 04/19/16 1204  . insulin glargine (LANTUS) injection 20 Units  20 Units Subcutaneous QHS Jomarie Longs, MD   20 Units at 04/18/16 2130  . lisinopril  (PRINIVIL,ZESTRIL) tablet 10 mg  10 mg Oral Daily Kristeen Mans, NP   10 mg at 04/19/16 0809  . nicotine polacrilex (NICORETTE) gum 2 mg  2 mg Oral PRN Jomarie Longs, MD   2 mg at 04/17/16 0748  . OLANZapine (ZYPREXA) tablet 10 mg  10 mg Oral BID PRN Jomarie Longs, MD   10 mg at 04/08/16 0839   Or  . OLANZapine (ZYPREXA) injection 10 mg  10 mg Intramuscular BID PRN Jomarie Longs, MD      . temazepam (RESTORIL) capsule 15 mg  15 mg Oral QHS Jomarie Longs, MD   15 mg at 04/18/16 2129   Lab Results:  Results for orders placed or performed during the hospital encounter of 03/19/16 (from the past 48 hour(s))  Glucose, capillary     Status: Abnormal   Collection Time: 04/17/16  4:43 PM  Result Value Ref Range   Glucose-Capillary 143 (H) 65 - 99 mg/dL  Valproic acid level     Status: Abnormal   Collection Time: 04/17/16  6:48 PM  Result Value Ref Range   Valproic Acid Lvl 45 (L) 50.0 - 100.0 ug/mL    Comment: Performed at [facility]  Glucose, capillary     Status: Abnormal   Collection Time: 04/17/16  9:10 PM  Result Value Ref Range   Glucose-Capillary 174 (H) 65 - 99 mg/dL  Glucose, capillary     Status: Abnormal   Collection Time: 04/18/16  6:01 AM  Result Value Ref Range   Glucose-Capillary 139 (H) 65 - 99 mg/dL   Comment 1 Notify RN   Glucose, capillary     Status: Abnormal   Collection Time: 04/18/16 11:55 AM  Result Value Ref Range   Glucose-Capillary 177 (H) 65 - 99 mg/dL   Comment 1 Notify RN    Comment 2 Document in Chart   Glucose, capillary     Status: Abnormal   Collection Time: 04/18/16  5:12 PM  Result Value Ref Range   Glucose-Capillary 150 (H) 65 - 99 mg/dL   Comment 1 Notify RN    Comment 2 Document in Chart   Glucose, capillary     Status: Abnormal   Collection Time: 04/18/16  8:33 PM  Result Value Ref Range   Glucose-Capillary 207 (H) 65 - 99 mg/dL   Comment 1 Notify RN   Glucose, capillary     Status: Abnormal   Collection Time:  04/19/16  5:47 AM  Result Value Ref Range   Glucose-Capillary 148 (H) 65 - 99 mg/dL  Glucose, capillary     Status: Abnormal   Collection Time: 04/19/16 12:03 PM  Result Value Ref Range   Glucose-Capillary 115 (H) 65 - 99 mg/dL   Blood Alcohol  level:  Lab Results  Component Value Date   ETH <5 03/18/2016   ETH <5 03/01/2016   Metabolic Disorder Labs: Lab Results  Component Value Date   HGBA1C 8.0 (H) 03/23/2016   MPG 183 03/23/2016   MPG 183 03/20/2016   Lab Results  Component Value Date   PROLACTIN 31.5 (H) 03/23/2016   PROLACTIN 36.6 (H) 03/20/2016   Lab Results  Component Value Date   CHOL 145 03/23/2016   TRIG 144 03/23/2016   HDL 30 (L) 03/23/2016   CHOLHDL 4.8 03/23/2016   VLDL 29 03/23/2016   LDLCALC 86 03/23/2016   LDLCALC 21 03/20/2016   Physical Findings: AIMS: Facial and Oral Movements Muscles of Facial Expression: None, normal Lips and Perioral Area: None, normal Jaw: None, normal Tongue: None, normal,Extremity Movements Upper (arms, wrists, hands, fingers): None, normal Lower (legs, knees, ankles, toes): None, normal, Trunk Movements Neck, shoulders, hips: None, normal, Overall Severity Severity of abnormal movements (highest score from questions above): None, normal Incapacitation due to abnormal movements: None, normal Patient's awareness of abnormal movements (rate only patient's report): No Awareness, Dental Status Current problems with teeth and/or dentures?: No Does patient usually wear dentures?: No  CIWA:  CIWA-Ar Total: 1 COWS:  COWS Total Score: 3  Musculoskeletal: Strength & Muscle Tone: within normal limits Gait & Station: normal Patient leans: N/A   Psychiatric Specialty Exam: Physical Exam  Nursing note and vitals reviewed.   Review of Systems  Psychiatric/Behavioral: The patient is nervous/anxious.   All other systems reviewed and are negative.   Blood pressure (!) 143/85, pulse (!) 105, temperature 98.4 F (36.9 C),  temperature source Oral, resp. rate 18, height 5' 5.5" (1.664 m), weight 91.6 kg (202 lb), SpO2 100 %.Body mass index is 33.1 kg/m.  General Appearance: Guarded  Eye Contact:  Fair  Speech:  Normal Rate   Volume:  Decreased  Mood:  Anxious   Affect:  Appropriate   Thought Process:  Irrelevant and Descriptions of Associations: Circumstantial   Orientation:  Full (Time, Place, and Person)  Thought Content:  Delusions and Paranoid Ideation on and off  Suicidal Thoughts:  No   Homicidal Thoughts:  No  Memory:  Immediate;   Fair Recent;   Poor Remote;   Poor  Judgement:  Impaired  Insight:  Shallow  Psychomotor Activity:  Restlessness  Concentration:  Concentration: Fair and Attention Span: Fair  Recall:  [employer] of Knowledge:  Fair  Language:  Fair  Akathisia:  No  Handed:  Right  AIMS (if indicated):     Assets:  Desire for Improvement  ADL's:  Intact  Cognition:  WNL  Sleep:  Number of Hours: 5   03/31/16 CSW was able to obtain collateral information from his community support - who reported patient's baseline a few months ago was much better , he does have chronic delusions , but he was abel to take care of self and participate better in decision making discussions .   Treatment Plan Summary: BAKER MORONTA is a 55 year old AA male with history ofparanoid schizophrenia, DM, HTN,who was found walking naked by neighbors, recommended by his case manager to be brought to the hospital for evaluation.  Patient today continues to be dysphoric , delusional - progressing , denies any new concerns . CS[facility] to [facility]waiting [facility].    Paranoid schizophrenia (HCC) currently unstable- improving.  Will continue today 04/19/16  plan as below except where it is noted.  Daily  contact with patient to assess and evaluate symptoms and progress in treatment and Medication management    Will continue  Clozaril  12.5 mg po daily and clozaril to 125 mg po qhs for  psychosis.   Reviewed CBC with diff - 04/06/16- ANC- 8.6 /WBC- 12.8  wnl.needs weekly CBC/ANC- 04/13/16-ANC - 8/WBC-12.2  Continue Depakote  ER 1000 mg po qhs for mood lability , although  Depakote level  - 03/25/16- 72 ( therapeutic) ,  depakote level on 03/30/16- I have reviewed depakote level  - 74- therapeutic.Dose reduced to address his tiredness. Repeat Depakote level- 45 - but will not increase it since pt is tired at higher dosage.  Will continue Restoril 15 mg po qhs for sleep.  Will continue to monitor vitals ,medication compliance and treatment side effects while patient is here.   Reviewed labs: EKG - qtc - wnl, TSH- wnl , hba1c- 8- dietician consult once patient is more stable  ,  pl -31.5 ,  lipid panel HDL - low - diet control recommendations to be provided when patient is more stable.  CSW will continue  working on disposition. [facility]  [facility].Pt is on their [facility] list.[facility] to make [facility].  Patient to participate in therapeutic milieu  Kassadee Carawan, MD  04/19/2016, 12:58 PM

## 2016-04-19 NOTE — Plan of Care (Signed)
Problem: Safety: Goal: Ability to remain free from injury will improve Outcome: Progressing Pt safe on the unit at this time   

## 2016-04-20 LAB — GLUCOSE, CAPILLARY
GLUCOSE-CAPILLARY: 127 mg/dL — AB (ref 65–99)
Glucose-Capillary: 177 mg/dL — ABNORMAL HIGH (ref 65–99)
Glucose-Capillary: 123 mg/dL — ABNORMAL HIGH (ref 65–99)
Glucose-Capillary: 161 mg/dL — ABNORMAL HIGH (ref 65–99)

## 2016-04-20 LAB — CBC WITH DIFFERENTIAL/PLATELET
BASOS ABS: 0 10*3/uL (ref 0.0–0.1)
BASOS PCT: 0 %
EOS PCT: 2 %
Eosinophils Absolute: 0.2 10*3/uL (ref 0.0–0.7)
HEMATOCRIT: 35 % — AB (ref 39.0–52.0)
HEMOGLOBIN: 11.5 g/dL — AB (ref 13.0–17.0)
LYMPHS ABS: 3.3 10*3/uL (ref 0.7–4.0)
LYMPHS PCT: 30 %
MCH: 26.5 pg (ref 26.0–34.0)
MCHC: 32.9 g/dL (ref 30.0–36.0)
MCV: 80.6 fL (ref 78.0–100.0)
MONOS PCT: 11 %
Monocytes Absolute: 1.2 10*3/uL — ABNORMAL HIGH (ref 0.1–1.0)
NEUTROS ABS: 6.4 10*3/uL (ref 1.7–7.7)
Neutrophils Relative %: 57 %
Platelets: 277 10*3/uL (ref 150–400)
RBC: 4.34 MIL/uL (ref 4.22–5.81)
RDW: 14.5 % (ref 11.5–15.5)
WBC: 11.1 10*3/uL — ABNORMAL HIGH (ref 4.0–10.5)

## 2016-04-20 MED ORDER — LISINOPRIL 20 MG PO TABS
20.0000 mg | ORAL_TABLET | Freq: Every day | ORAL | Status: DC
  Administered 2016-04-21 – 2016-04-27 (×7): 20 mg via ORAL
  Filled 2016-04-20 (×10): qty 1

## 2016-04-20 NOTE — Progress Notes (Signed)
Recreation Therapy Notes  Animal-Assisted Activity (AAA) Program Checklist/Progress Notes Patient Eligibility Criteria Checklist & Daily Group note for Rec Tx Intervention  Date: 11.28.2017 Time: 2:45 [address]: [address]    AAA/T Program Assumption of Risk Form signed by Patient/ or Parent Legal Guardian Yes  Patient is free of allergies or sever asthma Yes  Patient reports no fear of animals Yes  Patient reports no history of cruelty to animals Yes  Patient understands his/her participation is voluntary Yes  Patient washes hands before animal contact Yes  Patient washes hands after animal contact Yes  Behavioral Response: Did not attend.    Clinical Observations/Feedback: Patient discussed with MD for appropriateness in pet therapy session. Both LRT and MD agree patient is appropriate for participation. Patient offered participation in session, consent signed previously during admission. Patient hastily declined LRT invitation to attend.   Marykay Lexenise L Tanyon Alipio, LRT/CTRS  Jonatan Wilsey L 04/20/2016 3:13 PM

## 2016-04-20 NOTE — [facility] Group Notes (Signed)
[facility] LCSW Group Therapy  04/20/2016 , 12:07 PM   Type of Therapy:  Group Therapy  Participation Level:  Active  Participation Quality:  Attentive  Affect:  Appropriate  Cognitive:  Alert  Insight:  Improving  Engagement in Therapy:  Engaged  Modes of Intervention:  Discussion, Exploration and Socialization  Summary of Progress/Problems: Today's group focused on the term Diagnosis.  Participants were asked to define the term, and then pronounce whether it is a negative, positive or neutral term.  Invited.  Chose to not attend.  Daryel Geraldorth, Shira Bobst B 04/20/2016 , 12:07 PM

## 2016-04-20 NOTE — [facility] Counselor (Signed)
[place] housing specialist with [facility], [employer] and [facility].  Neither one of them has anyone in [place], nor knows of any housing there for mental health.  Called [facility] [phone] that had greeting that [facility], sounded more like private residence.  Did not leave message. Left message for Mr Ronald Roman at [phone].  [place] [facility] [place] [phone].  Did not leave message. Mr Ronald Roman called back.  He will come to visit pt in AM.

## 2016-04-20 NOTE — [facility] Group Notes (Signed)
Adult Psychoeducational Group Note  Date:  04/20/2016 Time:  0930-1000 :  Patient encouraged to join group but did not attend.

## 2016-04-20 NOTE — Progress Notes (Signed)
  D: Pt was laying down resting in bed prior to the assessment. When asked if he had any questions or concerns pt stated, "concerned about this dirt I put on my face when I sweat".  Pt informed the writer that he planned to attend group tonight. Pt continues to have disorganized thoughts. Writer finds it difficult to follow him in conversation at times.  Pt has no other questions or concerns.  A:  Support and encouragement was offered. 15 min checks continued for safety.  R: Pt remains safe.

## 2016-04-20 NOTE — Progress Notes (Signed)
Nursing Note 04/20/2016 [id number]  Data Reports sleeping good witht PRN sleep med.  Rates depression 0/10, hopelessness 0/10, and anxiety 0/10. Affect flat.  Pleasant.  Reports feeling "hyper" on self-inventory sheet.  Forwards little but interacts when engaged.   Denies HI, SI, AVH.  Silly, joking with nurse and staff, though speech tangential.  For example was discussing having a child that he was both the mother and the father to in AM.   Action Spoke with patient 1:1, nurse offered support to patient throughout shift.  Continues to be monitored on 15 minute checks for safety.  Response Remains safe on unit, pleasant, but tangential and disorganized.

## 2016-04-20 NOTE — Progress Notes (Signed)
Adult Psychoeducational Group Note  Date:  04/20/2016 Time:  9:06 PM  Group Topic/Focus:  Wrap-Up Group:   The focus of this group is to help patients review their daily goal of treatment and discuss progress on daily workbooks.   Participation Level:  Active  Participation Quality:  Appropriate  Affect:  Appropriate  Cognitive:  Alert  Insight: Appropriate  Engagement in Group:  Engaged  Modes of Intervention:  Discussion  Additional Comments:  Patient states, "my day was excellent". Patient's goal for today was to get home to his children.  Lendell Gallick L Emanie Behan 04/20/2016, 9:06 PM

## 2016-04-20 NOTE — Progress Notes (Signed)
[facility] MD Progress Note  04/20/2016 11:55 AM Ronald Roman  MRN:  [id number]   Subjective: Patient states " I am fine. I want to go to [facility] or [place].'    Objective: Ronald Roman is a 55 year old AA male with history ofparanoid schizophrenia, DM, HTN,who was found walking naked by neighbors, recommended by his case manager to be brought to the hospital for evaluation.  Patient seen and chart reviewed.Discussed patient with treatment team.  Pt today seen in his room , appears more linear , less disorganized , does talk about his delusions at times - but is not too preoccupied with it. Per staff - no new concerns , he is compliant on medications - denies ADRs. Pt awaiting placement - given his hx of chronic mental illness , recent multiple IP admissions , him being on Clozaril which needs frequent lab monitoring - patient needs to be in a supervised , structured setting - otherwise could decompensate quickly .    Principal Problem: Paranoid schizophrenia (HCC)  Diagnosis:   Patient Active Problem List   Diagnosis Date Noted  . Acute psychosis [F23]   . Leukocytosis [D72.829] 02/06/2015  . Sleep apnea [G47.30] 02/05/2015  . AKI (acute kidney injury) (HCC) [N17.9] 02/04/2015  . Seizure disorder (HCC) [G40.909] 02/04/2015  . Acute encephalopathy [G93.40] 02/04/2015  . Diabetes mellitus without complication (HCC) [E11.9]   . Hyperlipidemia [E78.5]   . Hypertension [I10]   . Paranoid schizophrenia (HCC) [F20.0]    Total Time spent with patient: 25 minutes  Past Psychiatric History: Please see H&P.  Past Medical History:  Past Medical History:  Diagnosis Date  . Diabetes mellitus without complication (HCC)   . GERD (gastroesophageal reflux disease)   . Hyperlipidemia   . Hypertension   . Paranoid schizophrenia (HCC)   . Sleep apnea   . Uric acid nephrolithiasis    History reviewed. No pertinent surgical history.  Family History: Patient denies  Family  Psychiatric  History: Pt denies  Social History: Patient [place] in [place] , is single , has children who does not live with him , reports he gets food stamps . History  Alcohol Use No     History  Drug Use No    Social History   Social History  . Marital status: Single    Spouse name: N/A  . Number of children: N/A  . Years of education: N/A   Social History Main Topics  . Smoking status: Current Every Day Smoker    Packs/day: 1.00    Types: Cigarettes  . Smokeless tobacco: Never Used  . Alcohol use No  . Drug use: No  . Sexual activity: Not Currently   Other Topics Concern  . None   Social History Narrative  . None   Additional Social History:   Sleep: Fair  Appetite:  Fair  Current Medications: Current Facility-Administered Medications  Medication Dose Route Frequency Provider Last Rate Last Dose  . acetaminophen (TYLENOL) tablet 650 mg  650 mg Oral Q6H PRN Kristeen Mans, NP   650 mg at 03/21/16 0329  . amantadine (SYMMETREL) capsule 100 mg  100 mg Oral BID Laveda Abbe, NP   100 mg at 04/20/16 0757  . cloZAPine (CLOZARIL) tablet 12.5 mg  12.5 mg Oral Daily Sandi Towe, MD   12.5 mg at 04/20/16 0757  . cloZAPine (CLOZARIL) tablet 125 mg  125 mg Oral QHS Ronald Longs, MD   125 mg at  04/19/16 2147  . divalproex (DEPAKOTE ER) 24 hr tablet 1,250 mg  1,250 mg Oral QHS Ronald LongsSaramma Haydyn Liddell, MD   1,250 mg at 04/19/16 2147  . insulin aspart (novoLOG) injection 0-15 Units  0-15 Units Subcutaneous TID WC Ronald LongsSaramma Indiah Heyden, MD   3 Units at 04/20/16 [phone]  . insulin aspart (novoLOG) injection 0-5 Units  0-5 Units Subcutaneous QHS Ronald LongsSaramma Onalee Steinbach, MD   2 Units at 04/18/16 2131  . insulin aspart (novoLOG) injection 4 Units  4 Units Subcutaneous TID WC Ronald LongsSaramma Marquavis Hannen, MD   4 Units at 04/20/16 [phone]  . insulin glargine (LANTUS) injection 20 Units  20 Units Subcutaneous QHS Ronald LongsSaramma Gem Conkle, MD   20 Units at 04/19/16 2148  . [START ON 04/21/2016] lisinopril  (PRINIVIL,ZESTRIL) tablet 20 mg  20 mg Oral Daily Hunter Bachar, MD      . nicotine polacrilex (NICORETTE) gum 2 mg  2 mg Oral PRN Ronald LongsSaramma Dayson Aboud, MD   2 mg at 04/20/16 0835  . OLANZapine (ZYPREXA) tablet 10 mg  10 mg Oral BID PRN Ronald LongsSaramma Ariyonna Twichell, MD   10 mg at 04/08/16 0839   Or  . OLANZapine (ZYPREXA) injection 10 mg  10 mg Intramuscular BID PRN Ronald LongsSaramma Lylianna Fraiser, MD      . temazepam (RESTORIL) capsule 15 mg  15 mg Oral QHS Ronald LongsSaramma Sadiq Mccauley, MD   15 mg at 04/19/16 2146   Lab Results:  Results for orders placed or performed during the hospital encounter of 03/19/16 (from the past 48 hour(s))  Glucose, capillary     Status: Abnormal   Collection Time: 04/18/16  5:12 PM  Result Value Ref Range   Glucose-Capillary 150 (H) 65 - 99 mg/dL   Comment 1 Notify RN    Comment 2 Document in Chart   Glucose, capillary     Status: Abnormal   Collection Time: 04/18/16  8:33 PM  Result Value Ref Range   Glucose-Capillary 207 (H) 65 - 99 mg/dL   Comment 1 Notify RN   Glucose, capillary     Status: Abnormal   Collection Time: 04/19/16  5:47 AM  Result Value Ref Range   Glucose-Capillary 148 (H) 65 - 99 mg/dL  Glucose, capillary     Status: Abnormal   Collection Time: 04/19/16 12:03 PM  Result Value Ref Range   Glucose-Capillary 115 (H) 65 - 99 mg/dL  Glucose, capillary     Status: Abnormal   Collection Time: 04/19/16  5:04 PM  Result Value Ref Range   Glucose-Capillary 168 (H) 65 - 99 mg/dL   Comment 1 Notify RN    Comment 2 Document in Chart   Glucose, capillary     Status: Abnormal   Collection Time: 04/19/16  7:56 PM  Result Value Ref Range   Glucose-Capillary 182 (H) 65 - 99 mg/dL  Glucose, capillary     Status: Abnormal   Collection Time: 04/20/16  6:08 AM  Result Value Ref Range   Glucose-Capillary 177 (H) 65 - 99 mg/dL  Glucose, capillary     Status: Abnormal   Collection Time: 04/20/16 11:49 AM  Result Value Ref Range   Glucose-Capillary 127 (H) 65 - 99 mg/dL   Blood Alcohol level:   Lab Results  Component Value Date   ETH <5 03/18/2016   ETH <5 03/01/2016   Metabolic Disorder Labs: Lab Results  Component Value Date   HGBA1C 8.0 (H) 03/23/2016   MPG 183 03/23/2016   MPG 183 03/20/2016   Lab Results  Component Value Date  PROLACTIN 31.5 (H) 03/23/2016   PROLACTIN 36.6 (H) 03/20/2016   Lab Results  Component Value Date   CHOL 145 03/23/2016   TRIG 144 03/23/2016   HDL 30 (L) 03/23/2016   CHOLHDL 4.8 03/23/2016   VLDL 29 03/23/2016   LDLCALC 86 03/23/2016   LDLCALC 21 03/20/2016   Physical Findings: AIMS: Facial and Oral Movements Muscles of Facial Expression: None, normal Lips and Perioral Area: None, normal Jaw: None, normal Tongue: None, normal,Extremity Movements Upper (arms, wrists, hands, fingers): None, normal Lower (legs, knees, ankles, toes): None, normal, Trunk Movements Neck, shoulders, hips: None, normal, Overall Severity Severity of abnormal movements (highest score from questions above): None, normal Incapacitation due to abnormal movements: None, normal Patient's awareness of abnormal movements (rate only patient's report): No Awareness, Dental Status Current problems with teeth and/or dentures?: No Does patient usually wear dentures?: No  CIWA:  CIWA-Ar Total: 1 COWS:  COWS Total Score: 3  Musculoskeletal: Strength & Muscle Tone: within normal limits Gait & Station: normal Patient leans: N/A   Psychiatric Specialty Exam: Physical Exam  Nursing note and vitals reviewed.   Review of Systems  Psychiatric/Behavioral: The patient is nervous/anxious.   All other systems reviewed and are negative.   Blood pressure (!) 134/91, pulse (!) 103, temperature 98.5 F (36.9 C), temperature source Oral, resp. rate 18, height 5' 5.5" (1.664 m), weight 91.6 kg (202 lb), SpO2 100 %.Body mass index is 33.1 kg/m.  General Appearance: Guarded  Eye Contact:  Fair  Speech:  Normal Rate   Volume:  Decreased  Mood:  Anxious   Affect:   Appropriate   Thought Process:  Linear and Descriptions of Associations: Circumstantial   Orientation:  Full (Time, Place, and Person)  Thought Content:  Delusions and Paranoid Ideation on and off  Suicidal Thoughts:  No   Homicidal Thoughts:  No  Memory:  Immediate;   Fair Recent;   Poor Remote;   Poor  Judgement:  Impaired  Insight:  Shallow  Psychomotor Activity:  Restlessness improving  Concentration:  Concentration: Fair and Attention Span: Fair  Recall:  [employer] of Knowledge:  Fair  Language:  Fair  Akathisia:  No  Handed:  Right  AIMS (if indicated):     Assets:  Desire for Improvement  ADL's:  Intact  Cognition:  WNL  Sleep:  Number of Hours: 5   03/31/16 CSW was able to obtain collateral information from his community support - who reported patient's baseline a few months ago was much better , he does have chronic delusions , but he was able to take care of self and participate better in decision making discussions .   Treatment Plan Summary: ANEL PUROHIT is a 54 year old AA male with history ofparanoid schizophrenia, DM, HTN,who was found walking naked by neighbors, recommended by his case manager to be brought to the hospital for evaluation.  Patient today seen as more appropriate , continues to have chronic delusions , will continue treatment. CSW to continue [facility] referral.Awaiting [facility] placement.    Paranoid schizophrenia (HCC)  improving.  Will continue today 04/20/16  plan as below except where it is noted.  Daily contact with patient to assess and evaluate symptoms and progress in treatment and Medication management    Will continue  Clozaril  12.5 mg po daily and clozaril 125 mg po qhs for psychosis.   Reviewed CBC with diff - 04/06/16- ANC- 8.6 /WBC- 12.8  wnl.needs weekly CBC/ANC- 04/13/16-ANC - 8/WBC-12.2  Continue Depakote  ER 1000 mg po qhs for mood lability , although  Depakote level  - 03/25/16- 72 ( therapeutic) ,  depakote level on 03/30/16- I  have reviewed depakote level  - 74- therapeutic.Dose reduced to address his tiredness. Repeat Depakote level- 45 - but will not increase it since pt is tired at higher dosage.  Will continue Restoril 15 mg po qhs for sleep.  Will continue to monitor vitals ,medication compliance and treatment side effects while patient is here.   Reviewed labs: EKG - qtc - wnl, TSH- wnl , hba1c- 8- dietician consult once patient is more stable  ,  pl -31.5 ,  lipid panel HDL - low - diet control recommendations to be provided when patient is more stable.  CSW will continue  working on disposition. [facility]  [facility].Pt is on their wait list.[facility] improving.  Patient to participate in therapeutic milieu  Kendyn Zaman, MD  04/20/2016, 11:55 AM

## 2016-04-20 NOTE — Progress Notes (Signed)
Recreation Therapy Notes  Date: 04/20/16 Time: 1000 Location: 500 Hall Dayroom  Group Topic: Wellness  Goal Area(s) Addresses:  Patient will define components of whole wellness. Patient will verbalize benefit of whole wellness.  Behavioral Response: Engaged  Intervention:  ArchivistChairs, beach ball  Activity: Keep It ContractorGoing Volleyball.  Patients were placed in a circle.  Patients were to hit the ball back and forth to each other.  Patients were allowed to bounce the ball off the ground but the ball could not roll to a stop on the floor.  Patients were to try and beat the previous record of 550.  Education: Wellness, Building control surveyorDischarge Planning.   Education Outcome: Acknowledges education/In group clarification offered/Needs additional education.   Clinical Observations/Feedback: Pt was active and engaged during group.  Pt began going back and forth with one of his peers about who was a better tennis player.  Pt was able to be redirected.  Pt was giving his peers pointers on how they should hit the ball to make the activity easier.  Pt left earlier and did not return.    Caroll RancherMarjette Attikus Roman, LRT/CTRS       Lillia AbedLindsay, Tonyia Marschall A 04/20/2016 11:13 AM

## 2016-04-21 LAB — GLUCOSE, CAPILLARY
GLUCOSE-CAPILLARY: 171 mg/dL — AB (ref 65–99)
Glucose-Capillary: 148 mg/dL — ABNORMAL HIGH (ref 65–99)
Glucose-Capillary: 203 mg/dL — ABNORMAL HIGH (ref 65–99)
Glucose-Capillary: 217 mg/dL — ABNORMAL HIGH (ref 65–99)

## 2016-04-21 MED ORDER — TUBERCULIN PPD 5 UNIT/0.1ML ID SOLN
5.0000 [IU] | Freq: Once | INTRADERMAL | Status: AC
  Administered 2016-04-21: 5 [IU] via INTRADERMAL

## 2016-04-21 MED ORDER — HYDROCERIN EX CREA
TOPICAL_CREAM | Freq: Three times a day (TID) | CUTANEOUS | Status: DC
  Administered 2016-04-21 – 2016-04-23 (×5): via TOPICAL
  Administered 2016-04-23: 1 via TOPICAL
  Administered 2016-04-24 – 2016-04-25 (×6): via TOPICAL
  Administered 2016-04-26 (×2): 1 via TOPICAL
  Administered 2016-04-26 – 2016-04-27 (×3): via TOPICAL
  Filled 2016-04-21: qty 113

## 2016-04-21 NOTE — Tx Team (Signed)
Interdisciplinary Treatment and Diagnostic Plan Update  04/21/2016 Time of Session: 8:41 AM  Ronald AngstBarry D Simm MRN: [id number]  Principal Diagnosis: Paranoid schizophrenia [facility](HCC)  Secondary Diagnoses: Principal Problem:   Paranoid schizophrenia (HCC)   Current Medications:  Current Facility-Administered Medications  Medication Dose Route Frequency Provider Last Rate Last Dose  . acetaminophen (TYLENOL) tablet 650 mg  650 mg Oral Q6H PRN Kristeen MansFran E Hobson, NP   650 mg at 03/21/16 0329  . amantadine (SYMMETREL) capsule 100 mg  100 mg Oral BID Laveda AbbeLaurie Britton Parks, NP   100 mg at 04/21/16 0800  . cloZAPine (CLOZARIL) tablet 12.5 mg  12.5 mg Oral Daily Saramma Eappen, MD   12.5 mg at 04/21/16 0800  . cloZAPine (CLOZARIL) tablet 125 mg  125 mg Oral QHS Jomarie LongsSaramma Eappen, MD   125 mg at 04/20/16 2146  . divalproex (DEPAKOTE ER) 24 hr tablet 1,250 mg  1,250 mg Oral QHS Jomarie LongsSaramma Eappen, MD   1,250 mg at 04/20/16 2146  . insulin aspart (novoLOG) injection 0-15 Units  0-15 Units Subcutaneous TID WC Jomarie LongsSaramma Eappen, MD   2 Units at 04/21/16 0605  . insulin aspart (novoLOG) injection 0-5 Units  0-5 Units Subcutaneous QHS Jomarie LongsSaramma Eappen, MD   2 Units at 04/18/16 2131  . insulin aspart (novoLOG) injection 4 Units  4 Units Subcutaneous TID WC Jomarie LongsSaramma Eappen, MD   4 Units at 04/21/16 0606  . insulin glargine (LANTUS) injection 20 Units  20 Units Subcutaneous QHS Jomarie LongsSaramma Eappen, MD   20 Units at 04/20/16 2149  . lisinopril (PRINIVIL,ZESTRIL) tablet 20 mg  20 mg Oral Daily Saramma Eappen, MD   20 mg at 04/21/16 0800  . nicotine polacrilex (NICORETTE) gum 2 mg  2 mg Oral PRN Jomarie LongsSaramma Eappen, MD   2 mg at 04/20/16 0835  . OLANZapine (ZYPREXA) tablet 10 mg  10 mg Oral BID PRN Jomarie LongsSaramma Eappen, MD   10 mg at 04/08/16 0839   Or  . OLANZapine (ZYPREXA) injection 10 mg  10 mg Intramuscular BID PRN Jomarie LongsSaramma Eappen, MD      . temazepam (RESTORIL) capsule 15 mg  15 mg Oral QHS Jomarie LongsSaramma Eappen, MD   15 mg at 04/20/16 2146    PTA  Medications: Prescriptions Prior to Admission  Medication Sig Dispense Refill Last Dose  . albuterol (PROVENTIL HFA;VENTOLIN HFA) 108 (90 BASE) MCG/ACT inhaler Inhale 2 puffs into the lungs every 4 (four) hours as needed for wheezing or shortness of breath. (Patient not taking: Reported on 03/19/2016) 1 Inhaler 0 Not Taking at Unknown time  . cetirizine (ZYRTEC) 10 MG chewable tablet Chew 1 tablet (10 mg total) by mouth daily. (Patient not taking: Reported on 03/19/2016) 20 tablet 1 Not Taking at Unknown time  . cloZAPine (CLOZARIL) 100 MG tablet Take 1 tablet (100 mg total) by mouth 2 (two) times daily. (Patient not taking: Reported on 03/19/2016) 60 tablet 0 Not Taking at Unknown time  . divalproex (DEPAKOTE) 250 MG DR tablet Take 1 tablet (250 mg total) by mouth at bedtime. (Patient not taking: Reported on 03/19/2016) 30 tablet 0 Not Taking at Unknown time  . divalproex (DEPAKOTE) 500 MG DR tablet Take 2 tablets (1,000 mg total) by mouth at bedtime. (Patient not taking: Reported on 03/19/2016) 60 tablet 0 Not Taking at Unknown time  . insulin glargine (LANTUS) 100 UNIT/ML injection Inject 10 Units into the skin at bedtime.   Not Taking at Unknown time  . lisinopril (PRINIVIL,ZESTRIL) 10 MG tablet Take 10 mg by mouth daily.  Not Taking at Unknown time  . LORazepam (ATIVAN) 0.5 MG tablet Take 1 tablet (0.5 mg total) by mouth 2 (two) times daily. (Patient not taking: Reported on 03/19/2016) 60 tablet 0 Not Taking at Unknown time  . metoprolol tartrate (LOPRESSOR) 25 MG tablet Take 25 mg by mouth 2 (two) times daily.   Not Taking at Unknown time  . QUEtiapine (SEROQUEL) 100 MG tablet Take 1 tablet (100 mg total) by mouth at bedtime. (Patient not taking: Reported on 03/19/2016) 30 tablet 0 Not Taking at Unknown time  . QUEtiapine (SEROQUEL) 50 MG tablet Take 1 tablet (50 mg total) by mouth daily. (Patient not taking: Reported on 03/19/2016) 30 tablet 0 Not Taking at Unknown time  . zolpidem (AMBIEN) 5  MG tablet Take 1 tablet (5 mg total) by mouth at bedtime as needed for sleep. (Patient not taking: Reported on 03/19/2016) 30 tablet 0 Not Taking at Unknown time    Treatment Modalities: Medication Management, Group therapy, Case management,  1 to 1 session with clinician, Psychoeducation, Recreational therapy.   Physician Treatment Plan for Primary Diagnosis: Paranoid schizophrenia (HCC) Long Term Goal(s): Improvement in symptoms so as ready for discharge  Short Term Goals: Ability to identify changes in lifestyle to reduce recurrence of condition will improve  Medication Management: Evaluate patient's response, side effects, and tolerance of medication regimen.  Therapeutic Interventions: 1 to 1 sessions, Unit Group sessions and Medication administration.  Evaluation of Outcomes: Adequate for Discharge  Physician Treatment Plan for Secondary Diagnosis: Principal Problem:   Paranoid schizophrenia (HCC)   Long Term Goal(s): Improvement in symptoms so as ready for discharge  Short Term Goals: Ability to identify and develop effective coping behaviors will improve  Medication Management: Evaluate patient's response, side effects, and tolerance of medication regimen.  Therapeutic Interventions: 1 to 1 sessions, Unit Group sessions and Medication administration.  Evaluation of Outcomes: Adequate for Discharge  11/2: Patient today seen as disorganized, delusional - will change his clozaril to [facility] - given his inability to be compliant with labs as well as taking his medications , and given the fact that he is going to return to his independent living situation with case management available . Will discontinue  Clozaril for inability to be compliant with labs. Will start a trial of Invega 3 mg po qhs for psychosis. Will offer Invega sustenna IM prior to discharge. Pt agrees with plan. Will continue Depakote , change to ER 1000 mg po qhs for mood sx. Depakote level  - 03/25/16. Will  discontinue Trazodone for lack of efficacy. Start Doxepin 10 mg po qhs for sleep.  11/7:  Will continue Invega  12 mg po qhs for psychosis. Will offer Invega sustenna IM prior to discharge. Pt agrees with plan. Increased  Depakote  ER to 1250 mg po qhs for mood lability , although Depakote level - 03/25/16- 72 ( therapeutic) ,  depakote level on 03/30/16- I have reviewed depakote level today - 74- therapeutic. Will increase  Doxepin to 50 mg po qhs for sleep. Will continue Ativan  1 mg po qhs for anxiety   11/13: Patient today seen as minimizing his sx, continues to be grandiose , delusional , has sleep issues . Started on a second antipsychotic yesterday to augment the effect of Invega . Will reduce Invega to 9 mg po qhs for psychosis. Will continue Geodon 40 mg po bid with meals for augmenting the effect of Invega . Increased  Depakote  ER to 1250 mg po qhs  for mood lability , although Depakote level - 03/25/16- 72 ( therapeutic) ,  depakote level on 03/30/16- I have reviewed depakote level today - 74- therapeutic. Will discontinue  Doxepin for lack of efficacy. Start Elavil 25 mg po qhs for sleep. Will continue Ativan  1 mg po qhs for anxiety   11/16:Patient continues to be disorganized ,continues to have delusional thoughts , irrelevant on and off and continues to have  inappropriate laughter.  Will increase  Clozaril to 12.5 mg po daily and clozaril 50 mg po qhs for psychosis. Reviewed CBC with diff - 04/06/16- ANC- 8.6 /WBC- 12.8  wnl.needs weekly CBC/ANC. Will taper off  Geodon for lack of efficacy. Will continue  Depakote  ER to 1250 mg po qhs for mood lability , although Depakote level - 03/25/16- 72 ( therapeutic) ,  depakote level on 03/30/16- I have reviewed depakote level  - 74- therapeutic. Will continue Restoril 15 mg po qhs for sleep.  11/21: Today presents as irrelevant, delusional, refused AM meds  Will continue  Clozaril  12.5 mg po daily and increase clozaril to 100 mg po  qhs for psychosis. Reviewed CBC with diff - 04/06/16- ANC- 8.6 /WBC- 12.8  wnl.needs weekly CBC/ANC- 04/13/16- pt refused labs - will reorder. Reduced Depakote  ER to 1000 mg po qhs for mood lability , although Depakote level - 03/25/16- 72 ( therapeutic) ,  depakote level on 03/30/16- I have reviewed depakote level  - 74- therapeutic.Dose reduced to address his tiredness. Will get another depakote level on 04/16/16.  11/24: Presentation remains much the same.  Will continue  Clozaril  12.5 mg po daily and increase clozaril to 125 mg po qhs for psychosis.   Reviewed CBC with diff - 04/06/16- ANC- 8.6 /WBC- 12.8  wnl.needs weekly CBC/ANC- 04/13/16-ANC - 8/WBC-12.2 Reduced Depakote  ER to 1000 mg po qhs for mood lability , although Depakote level - 03/25/16- 72 ( therapeutic) ,  depakote level on 03/30/16- I have reviewed depakote level  - 74- therapeutic.Dose reduced to address his tiredness. Will get another depakote level on 04/16/16. Will continue Restoril 15 mg po qhs for sleep.   RN Treatment Plan for Primary Diagnosis: Paranoid schizophrenia (HCC) Long Term Goal(s): Knowledge of disease and therapeutic regimen to maintain health will improve  Short Term Goals: Compliance with prescribed medications will improve  Medication Management: RN will administer medications as ordered by provider, will assess and evaluate patient's response and provide education to patient for prescribed medication. RN will report any adverse and/or side effects to prescribing provider.  Therapeutic Interventions: 1 on 1 counseling sessions, Psychoeducation, Medication administration, Evaluate responses to treatment, Monitor vital signs and CBGs as ordered, Perform/monitor CIWA, COWS, AIMS and Fall Risk screenings as ordered, Perform wound care treatments as ordered.  Evaluation of Outcomes: Progressing   LCSW Treatment Plan for Primary Diagnosis: Paranoid schizophrenia (HCC) Long Term Goal(s): Safe transition  to appropriate next level of care at discharge, Engage patient in therapeutic group addressing interpersonal concerns.  Short Term Goals: Engage patient in aftercare planning with referrals and resources  Therapeutic Interventions: Assess for all discharge needs, 1 to 1 time with Social worker, Explore available resources and support systems, Assess for adequacy in community support network, Educate family and significant other(s) on suicide prevention, Complete Psychosocial Assessment, Interpersonal group therapy.  Evaluation of Outcomes: Progressing  CSW left message for Melody Cherlyn CushingWilloughby, [facility] transition coordinator at [phone]. 11/2:  [facility].  However,  they are requesting referral to [facility] as a woman with whom Per had a good relationship with in the past has transferred to that agency.  As noted above, we are trying to switch him to injectable due to recent history of non-compliance with meds, and the fact that he will not be monitored daily. 11/7:  [facility] came to see patient.  Unable to complete assessment due to delusions, disorganization 11/13: Pt visited by [facility] peer support last week-they report he is far from baseline previous to first of three hospitalizations that began in October, and that he appears incapable of living independently.  [facility] has agreed that if he does not progress under our care, they will support [facility] referral 11/16: Pt is on the [facility] wait list. CSW to begin PASSR process this week for placement despite the lack of progress addressing mental health symptoms 11/21: Pt was visited by Roena Malady [facility] manager.  Her feedback is that irrelevence and disorganization is not an issue, but inability to agree that he agrees to go to [facility] is an issue, and he insists that he will return to [employer] at d/c.  "I own the whole town. I can stay anywhere I want." 11/29:  Pt was visited by Mr Willeen Cass today for possible placement.   Mr Wardell Heath and Elden agreed they would like to work together.  CSW to confirm that Mr Wardell Heath will be payee with [facility], resubmit request for PASSR, Administer PPD.  Hopeful placement on Monday  Progress in Treatment: Attending groups: No Participating in groups: No Taking medication as prescribed: Yes Toleration medication: Yes, no side effects reported at this time Family/Significant other contact made: Yes  See above Patient understands diagnosis: No  Limited insight Discussing patient identified problems/goals with staff: Yes Medical problems stabilized or resolved: Yes Denies suicidal/homicidal ideation: Yes Issues/concerns per patient self-inventory: None Other: N/A  New problem(s) identified: None identified at this time.   New Short Term/Long Term Goal(s): None identified at this time.   Discharge Plan or Barriers: See above  Reason for Continuation of Hospitalization:  Medication stabilization Delusions   Estimated Length of Stay: 3-5 days  Attendees: Patient: 04/21/2016  8:41 AM  Physician: Jomarie Longs, MD 04/21/2016  8:41 AM  Nursing: Quintella Reichert, RN 04/21/2016  8:41 AM  RN Care Manager: Onnie Boer, RN 04/21/2016  8:41 AM  Social Worker: Richelle Ito 04/21/2016  8:41 AM  Recreational Therapist: Marjette  04/21/2016  8:41 AM  Other: Tomasita Morrow 04/21/2016  8:41 AM  Other:  04/21/2016  8:41 AM    Scribe for Treatment Team:  Daryel Gerald LCSW 04/21/2016 8:41 AM

## 2016-04-21 NOTE — Progress Notes (Signed)
Adult Psychoeducational Group Note  Date:  04/21/2016 Time:  11:35 PM  Group Topic/Focus:  Wrap-Up Group:   The focus of this group is to help patients review their daily goal of treatment and discuss progress on daily workbooks.   Participation Level:  Active  Participation Quality:  Appropriate  Affect:  Appropriate  Cognitive:  Alert  Insight: Appropriate  Engagement in Group:  Engaged  Modes of Intervention:  Discussion  Additional Comments:  Patient states, "my day was terrific".  Shota Kohrs L Naod Sweetland 04/21/2016, 11:35 PM

## 2016-04-21 NOTE — [facility] Group Notes (Signed)
[facility] LCSW Group Therapy  04/21/2016 2:30 PM   Type of Therapy:  Group Therapy   Participation Level:  Engaged  Participation Quality:  Attentive  Affect:  Appropriate   Cognitive:  Alert   Insight:  Engaged  Engagement in Therapy:  Improving   Modes of Intervention:  Education, Exploration, Socialization   Summary of Progress/Problems: Invited, chose not to attend.   [name] from [employer] was here to tell his story of recovery, inform patients about [employer] and play his guitar.   [name] 04/21/2016 2:30 PM

## 2016-04-21 NOTE — Progress Notes (Signed)
  D: When asked about his day pt stated, "my day been going alright, it's been terrific". Pt then spoke about his bp stating that he "expected it be lower". Pt was able to follow the concept that higher body weight, and diet has an impact on his blood pressure. Began by telling the writer that he'd gotten 2 plates of tacos for dinner and that he's trying to lose weight> Pt has no other questions or concerns.    A:  Support and encouragement was offered. 15 min checks continued for safety.  R: Pt remains safe.

## 2016-04-21 NOTE — Progress Notes (Signed)
[facility]  04/21/2016 12:42 PM Ronald Roman  MRN:  [id number]   Subjective: Patient states " I am ok."     Objective: Ronald Roman is a 55 year old AA male with history ofparanoid schizophrenia, DM, HTN,who was found walking naked by neighbors, recommended by his case manager to be brought to the hospital for evaluation.  Patient seen and chart reviewed.Discussed patient with treatment team.  Pt today seen as pleasant , he is more organized , his thought process more linear. Pt likely at baseline - although he does have chronic delusions - which continues . He is not too preoccupied with them. Pt has been compliant on his medications ,denies ADRs. Pt with likely mild cognitive do - likely contributed by his chronic hx of schizophrenia as well as age and being on polypharmacy . However pt does well in a supervised setting . [facility] staff has assessed patient today - pending placement. Per staff - pt continues to need redirection on the unit.   Principal Problem: Paranoid schizophrenia (HCC)  Diagnosis:   Patient Active Problem List   Diagnosis Date Noted  . Acute psychosis [F23]   . Leukocytosis [D72.829] 02/06/2015  . Sleep apnea [G47.30] 02/05/2015  . AKI (acute kidney injury) (HCC) [N17.9] 02/04/2015  . Seizure disorder (HCC) [G40.909] 02/04/2015  . Acute encephalopathy [G93.40] 02/04/2015  . Diabetes mellitus without complication (HCC) [E11.9]   . Hyperlipidemia [E78.5]   . Hypertension [I10]   . Paranoid schizophrenia (HCC) [F20.0]    Total Time spent with patient: 25 minutes  Past Psychiatric History: Please see H&P.  Past Medical History:  Past Medical History:  Diagnosis Date  . Diabetes mellitus without complication (HCC)   . GERD (gastroesophageal reflux disease)   . Hyperlipidemia   . Hypertension   . Paranoid schizophrenia (HCC)   . Sleep apnea   . Uric acid nephrolithiasis    History  reviewed. No pertinent surgical history.  Family History: Patient denies  Family Psychiatric  History: Pt denies  Social History: [place] , is single , has children who does not live with him , reports he gets food stamps . History  Alcohol Use No     History  Drug Use No    Social History   Social History  . Marital status: Single    Spouse name: N/A  . Number of children: N/A  . Years of education: N/A   Social History Main Topics  . Smoking status: Current Every Day Smoker    Packs/day: 1.00    Types: Cigarettes  . Smokeless tobacco: Never Used  . Alcohol use No  . Drug use: No  . Sexual activity: Not Currently   Other Topics Concern  . None   Social History Narrative  . None   Additional Social History:   Sleep: Fair  Appetite:  Fair  Current Medications: Current Facility-Administered Medications  Medication Dose Route Frequency Provider Last Rate Last Dose  . acetaminophen (TYLENOL) tablet 650 mg  650 mg Oral Q6H PRN Kristeen MansFran E Hobson, NP   650 mg at 03/21/16 0329  . amantadine (SYMMETREL) capsule 100 mg  100 mg Oral BID Laveda AbbeLaurie Britton Parks, NP   100 mg at 04/21/16 0800  . cloZAPine (CLOZARIL) tablet 12.5 mg  12.5 mg Oral Daily Puneet Masoner, MD   12.5 mg at 04/21/16 0800  . cloZAPine (CLOZARIL) tablet 125 mg  125 mg Oral QHS Jomarie Longs, MD   125 mg at 04/20/16 2146  . divalproex (DEPAKOTE ER) 24 hr tablet 1,250 mg  1,250 mg Oral QHS Challen Spainhour, MD   1,250 mg at 04/20/16 2146  . hydrocerin (EUCERIN) cream   Topical TID Jomarie Longs, MD      . insulin aspart (novoLOG) injection 0-15 Units  0-15 Units Subcutaneous TID WC Jomarie Longs, MD   5 Units at 04/21/16 1206  . insulin aspart (novoLOG) injection 0-5 Units  0-5 Units Subcutaneous QHS Jomarie Longs, MD   2 Units at 04/18/16 2131  . insulin aspart (novoLOG) injection 4 Units  4 Units Subcutaneous TID WC Jomarie Longs, MD   4 Units at 04/21/16 1206  . insulin  glargine (LANTUS) injection 20 Units  20 Units Subcutaneous QHS Jomarie Longs, MD   20 Units at 04/20/16 2149  . lisinopril (PRINIVIL,ZESTRIL) tablet 20 mg  20 mg Oral Daily Leotha Westermeyer, MD   20 mg at 04/21/16 0800  . nicotine polacrilex (NICORETTE) gum 2 mg  2 mg Oral PRN Jomarie Longs, MD   2 mg at 04/20/16 0835  . OLANZapine (ZYPREXA) tablet 10 mg  10 mg Oral BID PRN Jomarie Longs, MD   10 mg at 04/08/16 0839   Or  . OLANZapine (ZYPREXA) injection 10 mg  10 mg Intramuscular BID PRN Saniya Tranchina, MD      . temazepam (RESTORIL) capsule 15 mg  15 mg Oral QHS Jomarie Longs, MD   15 mg at 04/20/16 2146  . tuberculin injection 5 Units  5 Units Intradermal Once Jomarie Longs, MD       Lab Results:  Results for orders placed or performed during the hospital encounter of 03/19/16 (from the past 48 hour(s))  Glucose, capillary     Status: Abnormal   Collection Time: 04/19/16  5:04 PM  Result Value Ref Range   Glucose-Capillary 168 (H) 65 - 99 mg/dL   Comment 1 Notify RN    Comment 2 Document in Chart   Glucose, capillary     Status: Abnormal   Collection Time: 04/19/16  7:56 PM  Result Value Ref Range   Glucose-Capillary 182 (H) 65 - 99 mg/dL  Glucose, capillary     Status: Abnormal   Collection Time: 04/20/16  6:08 AM  Result Value Ref Range   Glucose-Capillary 177 (H) 65 - 99 mg/dL  Glucose, capillary     Status: Abnormal   Collection Time: 04/20/16 11:49 AM  Result Value Ref Range   Glucose-Capillary 127 (H) 65 - 99 mg/dL  Glucose, capillary     Status: Abnormal   Collection Time: 04/20/16  4:58 PM  Result Value Ref Range   Glucose-Capillary 123 (H) 65 - 99 mg/dL  CBC with Differential/Platelet     Status: Abnormal   Collection Time: 04/20/16  7:09 PM  Result Value Ref Range   WBC 11.1 (H) 4.0 - 10.5 K/uL   RBC 4.34 4.22 - 5.81 MIL/uL   Hemoglobin 11.5 (L) 13.0 - 17.0 g/dL   HCT 16.1 (L) 09.6 - 04.5 %   MCV 80.6 78.0 - 100.0 fL   MCH 26.5 26.0 - 34.0 pg   MCHC 32.9  30.0 - 36.0 g/dL   RDW 40.9 81.1 - 91.4 %   Platelets 277 150 - 400 K/uL   Neutrophils Relative % 57 %   Lymphocytes Relative 30 %   Monocytes Relative 11 %   Eosinophils Relative 2 %   Basophils Relative  0 %   Neutro Abs 6.4 1.7 - 7.7 K/uL   Lymphs Abs 3.3 0.7 - 4.0 K/uL   Monocytes Absolute 1.2 (H) 0.1 - 1.0 K/uL   Eosinophils Absolute 0.2 0.0 - 0.7 K/uL   Basophils Absolute 0.0 0.0 - 0.1 K/uL   Smear Review LARGE PLATELETS PRESENT     Comment: Performed at [facility]  Glucose, capillary     Status: Abnormal   Collection Time: 04/20/16  8:08 PM  Result Value Ref Range   Glucose-Capillary 161 (H) 65 - 99 mg/dL  Glucose, capillary     Status: Abnormal   Collection Time: 04/21/16  6:01 AM  Result Value Ref Range   Glucose-Capillary 148 (H) 65 - 99 mg/dL  Glucose, capillary     Status: Abnormal   Collection Time: 04/21/16 11:47 AM  Result Value Ref Range   Glucose-Capillary 203 (H) 65 - 99 mg/dL   Blood Alcohol level:  Lab Results  Component Value Date   ETH <5 03/18/2016   ETH <5 03/01/2016   Metabolic Disorder Labs: Lab Results  Component Value Date   HGBA1C 8.0 (H) 03/23/2016   MPG 183 03/23/2016   MPG 183 03/20/2016   Lab Results  Component Value Date   PROLACTIN 31.5 (H) 03/23/2016   PROLACTIN 36.6 (H) 03/20/2016   Lab Results  Component Value Date   CHOL 145 03/23/2016   TRIG 144 03/23/2016   HDL 30 (L) 03/23/2016   CHOLHDL 4.8 03/23/2016   VLDL 29 03/23/2016   LDLCALC 86 03/23/2016   LDLCALC 21 03/20/2016   Physical Findings: AIMS: Facial and Oral Movements Muscles of Facial Expression: None, normal Lips and Perioral Area: None, normal Jaw: None, normal Tongue: None, normal,Extremity Movements Upper (arms, wrists, hands, fingers): None, normal Lower (legs, knees, ankles, toes): None, normal, Trunk Movements Neck, shoulders, hips: None, normal, Overall Severity Severity of abnormal movements (highest score from questions above):  None, normal Incapacitation due to abnormal movements: None, normal Patient's awareness of abnormal movements (rate only patient's report): No Awareness, Dental Status Current problems with teeth and/or dentures?: No Does patient usually wear dentures?: No  CIWA:  CIWA-Ar Total: 1 COWS:  COWS Total Score: 3  Musculoskeletal: Strength & Muscle Tone: within normal limits Gait & Station: normal Patient leans: N/A   Psychiatric Specialty Exam: Physical Exam  Nursing note and vitals reviewed.   Review of Systems  Psychiatric/Behavioral: The patient is nervous/anxious.   All other systems reviewed and are negative.   Blood pressure 134/88, pulse (!) 101, temperature 98 F (36.7 C), resp. rate 18, height 5' 5.5" (1.664 m), weight 91.6 kg (202 lb), SpO2 100 %.Body mass index is 33.1 kg/m.  General Appearance: Guarded  Eye Contact:  Fair  Speech:  Normal Rate   Volume:  Decreased  Mood:  Anxious about being here  Affect:  Appropriate   Thought Process:  Linear and Descriptions of Associations: Circumstantial   Orientation:  Full (Time, Place, and Person)  Thought Content:  Delusions on and off  Suicidal Thoughts:  No   Homicidal Thoughts:  No  Memory:  Immediate;   Fair Recent;   Poor Remote;   Poor  Judgement:  Fair  Insight:  Shallow  Psychomotor Activity:  Normal improving  Concentration:  Concentration: Fair and Attention Span: Fair  Recall:  [employer] of Knowledge:  Fair  Language:  Fair  Akathisia:  No  Handed:  Right  AIMS (if indicated):     Assets:  Desire for Improvement  ADL's:  Intact  Cognition:  WNL  Sleep:  Number of Hours: 5.5   03/31/16 CSW was able to obtain collateral information from his community support - who reported patient's baseline a few months ago was much better , he does have chronic delusions , but he was able to take care of self and participate better in decision making discussions .   Treatment Plan Summary: Ronald AngstBarry D Vigil is a 55  year old AA male with history ofparanoid schizophrenia, DM, HTN,who was found walking naked by neighbors, recommended by his case manager to be brought to the hospital for evaluation.  Patient today seen as pleasant , has chronic delusions ,likely at baseline. Given his hx of chronic mental illness , recent multiple IP admissions , him being on Clozaril which needs frequent lab monitoring , as well as his inability to take care of his medications and self administer it on a regular basis as well as his inability to follow up on routine lab work up - patient needs to be in a supervised , structured setting - otherwise could decompensate quickly .Patient was tried to be tapered of the clozaril with plan to discharge on a LAI - but patient decompensated tremendously during the trial and hence clozaril was re-initiated . Patient currently is likely stable , at baseline on the current medication regimen - is awaiting [facility] placement.       Paranoid schizophrenia (HCC)  improving.  Will continue today 04/21/16  plan as below except where it is noted.  Daily contact with patient to assess and evaluate symptoms and progress in treatment and Medication management    Will continue  Clozaril  12.5 mg po daily and clozaril 125 mg po qhs for psychosis.   Reviewed CBC with diff - 04/06/16- ANC- 8.6 /WBC- 12.8  wnl.needs weekly CBC/ANC- 04/13/16-ANC - 8/WBC-12.2  Continue Depakote  ER 1000 mg po qhs for mood lability , although  Depakote level  - 03/25/16- 72 ( therapeutic) ,  depakote level on 03/30/16- I have reviewed depakote level  - 74- therapeutic.Dose reduced to address his tiredness. Repeat Depakote level- 45 - but will not increase it since pt is tired at higher dosage.  Will continue Restoril 15 mg po qhs for sleep.  Will continue to monitor vitals ,medication compliance and treatment side effects while patient is here.   Reviewed labs: EKG - qtc - wnl, TSH- wnl , hba1c- 8- dietician consult once  patient is more stable  ,  pl -31.5 ,  lipid panel HDL - low - diet control recommendations to be provided when patient is more stable.  CSW will continue  working on disposition.[facility] .   PPD provided - 04/21/16.  Patient to participate in therapeutic milieu  Rafaella Kole, MD  04/21/2016, 12:42 PM

## 2016-04-21 NOTE — Progress Notes (Signed)
Patient has been pacing the hall, patient continues to remain delusion stating he is the king of the world and he owns everything in [place].  Patient has has had no issues of behavioral dsycontol  Assess patient for safety, continue to monitor as prescribed. Engage patient in 1:1 staff talks.   Continue to monitor as prescribed.  

## 2016-04-21 NOTE — Progress Notes (Signed)
Recreation Therapy Notes  Date: 04/21/16 Time: 1000 Location: 500 Hall Dayroom  Group Topic: Self-Esteem  Goal Area(s) Addresses:  Patient will identify positive ways to increase self-esteem. Patient will verbalize benefit of increased self-esteem.  Behavioral Response: Engaged  Intervention: Scientist, clinical (histocompatibility and immunogenetics)Construction paper, markers  Activity: Personalized License Plate.  Patients were given construction paper and markers to create a license plate that highlights the positive qualities about themselves.  Patients could also highlight important dates and events that are important to them.  Education:  Self-Esteem, Building control surveyorDischarge Planning.   Education Outcome: Acknowledges education/In group clarification offered/Needs additional education  Clinical Observations/Feedback: Pt stated he was leaving today and he was ready to go because "I been here too long".  Pt drew a big heart with a Valentine's Day message on it.  Pt was on task but didn't follow instructions.    Caroll RancherMarjette Simmie Camerer, LRT/CTRS         Caroll RancherLindsay, Emmani Lesueur A 04/21/2016 12:11 PM

## 2016-04-22 LAB — GLUCOSE, CAPILLARY
GLUCOSE-CAPILLARY: 215 mg/dL — AB (ref 65–99)
GLUCOSE-CAPILLARY: 305 mg/dL — AB (ref 65–99)
GLUCOSE-CAPILLARY: 138 mg/dL — AB (ref 65–99)
Glucose-Capillary: 170 mg/dL — ABNORMAL HIGH (ref 65–99)

## 2016-04-22 NOTE — Progress Notes (Signed)
  D: Pt was pleasant and humorous during the assessment. Informed the writer that he's scheduled to be discharged tomorrow. Pt has no questions or concerns.   A:  Support and encouragement was offered. 15 min checks continued for safety.  R: Pt remains safe.

## 2016-04-22 NOTE — Progress Notes (Signed)
Recreation Therapy Notes  Date: 04/22/16 Time: 1000 Location: 500 Hall Dayroom  Group Topic: Stress Management  Goal Area(s) Addresses:  Patient will verbalize importance of using healthy stress management.  Patient will identify positive emotions associated with healthy stress management.   Behavioral Response: Drowsy  Intervention: Calm App, Deep Breathing Script  Activity :  Deep Breathing, Body Scan Meditation.  LRT introduced the stress management techniques of deep breathing and meditation.  LRT read a script and played a recording to allow the patients to engage in the techniques.  Patients were to follow along to with the script and recording to engage in the activity.  Education:  Stress Management, Discharge Planning.   Education Outcome: Acknowledges edcuation/In group clarification offered/Needs additional education  Clinical Observations/Feedback:  Pt was drowsy and fell asleep in group.   Caroll RancherMarjette Jamile Sivils, LRT/CTRS      Caroll RancherLindsay, Emmilynn Marut A 04/22/2016 12:43 PM

## 2016-04-22 NOTE — Plan of Care (Signed)
Problem: Activity: Goal: Interest or engagement in leisure activities will improve Outcome: Progressing Nurse discussed depression/coping skills with patient.    

## 2016-04-22 NOTE — Progress Notes (Addendum)
[facility] MD Progress Note  04/22/2016 1:15 PM Ronald Roman  MRN:  [id number]   Subjective: Patient states " I am fine."     Objective: Ronald Roman is a 55 year old AA male with history ofparanoid schizophrenia, DM, HTN,who was found walking naked by neighbors, recommended by his case manager to be brought to the hospital for evaluation.  Patient seen and chart reviewed.Discussed patient with treatment team.  Pt today seen as vaguely anxious - preoccupied with discharge. Pt was accepted by [facility] - however is pending placement - CSW working on the same. Transition process may take 2-4 days as per CSW. Discussed this with pt - however he is adamant that he wants to be send to the [facility] today . Provided reassurance and support. Otherwise per staff - pt is taking medications - denies any concerns.   Principal Problem: Paranoid schizophrenia (HCC)  Diagnosis:   Patient Active Problem List   Diagnosis Date Noted  . Acute psychosis [F23]   . Leukocytosis [D72.829] 02/06/2015  . Sleep apnea [G47.30] 02/05/2015  . AKI (acute kidney injury) (HCC) [N17.9] 02/04/2015  . Seizure disorder (HCC) [G40.909] 02/04/2015  . Acute encephalopathy [G93.40] 02/04/2015  . Diabetes mellitus without complication (HCC) [E11.9]   . Hyperlipidemia [E78.5]   . Hypertension [I10]   . Paranoid schizophrenia (HCC) [F20.0]    Total Time spent with patient: 20 minutes  Past Psychiatric History: Please see H&P.  Past Medical History:  Past Medical History:  Diagnosis Date  . Diabetes mellitus without complication (HCC)   . GERD (gastroesophageal reflux disease)   . Hyperlipidemia   . Hypertension   . Paranoid schizophrenia (HCC)   . Sleep apnea   . Uric acid nephrolithiasis    History reviewed. No pertinent surgical history.  Family History: Patient denies  Family Psychiatric  History: Pt denies  Social History: Patient [place] , is single , has children who does not live  with him , reports he gets food stamps . History  Alcohol Use No     History  Drug Use No    Social History   Social History  . Marital status: Single    Spouse name: N/A  . Number of children: N/A  . Years of education: N/A   Social History Main Topics  . Smoking status: Current Every Day Smoker    Packs/day: 1.00    Types: Cigarettes  . Smokeless tobacco: Never Used  . Alcohol use No  . Drug use: No  . Sexual activity: Not Currently   Other Topics Concern  . None   Social History Narrative  . None   Additional Social History:   Sleep: Fair  Appetite:  Fair  Current Medications: Current Facility-Administered Medications  Medication Dose Route Frequency Provider Last Rate Last Dose  . acetaminophen (TYLENOL) tablet 650 mg  650 mg Oral Q6H PRN Kristeen Mans, NP   650 mg at 03/21/16 0329  . amantadine (SYMMETREL) capsule 100 mg  100 mg Oral BID Laveda Abbe, NP   100 mg at 04/22/16 0758  . cloZAPine (CLOZARIL) tablet 12.5 mg  12.5 mg Oral Daily Jomarie Longs, MD   12.5 mg at 04/22/16 0758  . cloZAPine (CLOZARIL) tablet 125 mg  125 mg Oral QHS Jomarie Longs, MD   125 mg at 04/21/16 2121  . divalproex (DEPAKOTE ER) 24 hr tablet 1,250 mg  1,250 mg Oral QHS Jomarie Longs, MD   1,250 mg at 04/21/16  2121  . hydrocerin (EUCERIN) cream   Topical TID Jomarie Longs, MD      . insulin aspart (novoLOG) injection 0-15 Units  0-15 Units Subcutaneous TID WC Jomarie Longs, MD   3 Units at 04/22/16 1202  . insulin aspart (novoLOG) injection 0-5 Units  0-5 Units Subcutaneous QHS Jomarie Longs, MD   2 Units at 04/21/16 2121  . insulin aspart (novoLOG) injection 4 Units  4 Units Subcutaneous TID WC Jomarie Longs, MD   4 Units at 04/22/16 1203  . insulin glargine (LANTUS) injection 20 Units  20 Units Subcutaneous QHS Jomarie Longs, MD   20 Units at 04/21/16 2122  . lisinopril (PRINIVIL,ZESTRIL) tablet 20 mg  20 mg Oral Daily Kaly Mcquary, MD   20 mg at 04/22/16 0800  .  nicotine polacrilex (NICORETTE) gum 2 mg  2 mg Oral PRN Jomarie Longs, MD   2 mg at 04/20/16 0835  . OLANZapine (ZYPREXA) tablet 10 mg  10 mg Oral BID PRN Jomarie Longs, MD   10 mg at 04/08/16 0839   Or  . OLANZapine (ZYPREXA) injection 10 mg  10 mg Intramuscular BID PRN Nonie Lochner, MD      . temazepam (RESTORIL) capsule 15 mg  15 mg Oral QHS Jomarie Longs, MD   15 mg at 04/21/16 2121  . tuberculin injection 5 Units  5 Units Intradermal Once Jomarie Longs, MD   5 Units at 04/21/16 1831   Lab Results:  Results for orders placed or performed during the hospital encounter of 03/19/16 (from the past 48 hour(s))  Glucose, capillary     Status: Abnormal   Collection Time: 04/20/16  4:58 PM  Result Value Ref Range   Glucose-Capillary 123 (H) 65 - 99 mg/dL  CBC with Differential/Platelet     Status: Abnormal   Collection Time: 04/20/16  7:09 PM  Result Value Ref Range   WBC 11.1 (H) 4.0 - 10.5 K/uL   RBC 4.34 4.22 - 5.81 MIL/uL   Hemoglobin 11.5 (L) 13.0 - 17.0 g/dL   HCT 40.9 (L) 81.1 - 91.4 %   MCV 80.6 78.0 - 100.0 fL   MCH 26.5 26.0 - 34.0 pg   MCHC 32.9 30.0 - 36.0 g/dL   RDW 78.2 95.6 - 21.3 %   Platelets 277 150 - 400 K/uL   Neutrophils Relative % 57 %   Lymphocytes Relative 30 %   Monocytes Relative 11 %   Eosinophils Relative 2 %   Basophils Relative 0 %   Neutro Abs 6.4 1.7 - 7.7 K/uL   Lymphs Abs 3.3 0.7 - 4.0 K/uL   Monocytes Absolute 1.2 (H) 0.1 - 1.0 K/uL   Eosinophils Absolute 0.2 0.0 - 0.7 K/uL   Basophils Absolute 0.0 0.0 - 0.1 K/uL   Smear Review LARGE PLATELETS PRESENT     Comment: Performed at [facility]  Glucose, capillary     Status: Abnormal   Collection Time: 04/20/16  8:08 PM  Result Value Ref Range   Glucose-Capillary 161 (H) 65 - 99 mg/dL  Glucose, capillary     Status: Abnormal   Collection Time: 04/21/16  6:01 AM  Result Value Ref Range   Glucose-Capillary 148 (H) 65 - 99 mg/dL  Glucose, capillary     Status: Abnormal    Collection Time: 04/21/16 11:47 AM  Result Value Ref Range   Glucose-Capillary 203 (H) 65 - 99 mg/dL  Glucose, capillary     Status: Abnormal   Collection Time: 04/21/16  4:51 PM  Result Value Ref Range   Glucose-Capillary 171 (H) 65 - 99 mg/dL  Glucose, capillary     Status: Abnormal   Collection Time: 04/21/16  8:27 PM  Result Value Ref Range   Glucose-Capillary 217 (H) 65 - 99 mg/dL  Glucose, capillary     Status: Abnormal   Collection Time: 04/22/16  6:07 AM  Result Value Ref Range   Glucose-Capillary 215 (H) 65 - 99 mg/dL  Glucose, capillary     Status: Abnormal   Collection Time: 04/22/16 11:51 AM  Result Value Ref Range   Glucose-Capillary 170 (H) 65 - 99 mg/dL   Blood Alcohol level:  Lab Results  Component Value Date   ETH <5 03/18/2016   ETH <5 03/01/2016   Metabolic Disorder Labs: Lab Results  Component Value Date   HGBA1C 8.0 (H) 03/23/2016   MPG 183 03/23/2016   MPG 183 03/20/2016   Lab Results  Component Value Date   PROLACTIN 31.5 (H) 03/23/2016   PROLACTIN 36.6 (H) 03/20/2016   Lab Results  Component Value Date   CHOL 145 03/23/2016   TRIG 144 03/23/2016   HDL 30 (L) 03/23/2016   CHOLHDL 4.8 03/23/2016   VLDL 29 03/23/2016   LDLCALC 86 03/23/2016   LDLCALC 21 03/20/2016   Physical Findings: AIMS: Facial and Oral Movements Muscles of Facial Expression: None, normal Lips and Perioral Area: None, normal Jaw: None, normal Tongue: None, normal,Extremity Movements Upper (arms, wrists, hands, fingers): None, normal Lower (legs, knees, ankles, toes): None, normal, Trunk Movements Neck, shoulders, hips: None, normal, Overall Severity Severity of abnormal movements (highest score from questions above): None, normal Incapacitation due to abnormal movements: None, normal Patient's awareness of abnormal movements (rate only patient's report): No Awareness, Dental Status Current problems with teeth and/or dentures?: No Does patient usually wear dentures?:  No  CIWA:  CIWA-Ar Total: 1 COWS:  COWS Total Score: 3  Musculoskeletal: Strength & Muscle Tone: within normal limits Gait & Station: normal Patient leans: N/A   Psychiatric Specialty Exam: Physical Exam  Nursing note and vitals reviewed.   Review of Systems  Psychiatric/Behavioral: The patient is nervous/anxious.   All other systems reviewed and are negative.   Blood pressure 134/88, pulse (!) 101, temperature 98 F (36.7 C), resp. rate 18, height 5' 5.5" (1.664 m), weight 91.6 kg (202 lb), SpO2 100 %.Body mass index is 33.1 kg/m.  General Appearance: Guarded  Eye Contact:  Fair  Speech:  Normal Rate   Volume:  Decreased  Mood:  Anxious about being here  Affect:  Appropriate   Thought Process:  Linear and Descriptions of Associations: Circumstantial   Orientation:  Full (Time, Place, and Person)  Thought Content:  Delusions on and off  Suicidal Thoughts:  No   Homicidal Thoughts:  No  Memory:  Immediate;   Fair Recent;   Poor Remote;   Poor  Judgement:  Fair  Insight:  Shallow  Psychomotor Activity:  Normal improving  Concentration:  Concentration: Fair and Attention Span: Fair  Recall:  F[employer]Fair  Fund of Knowledge:  Fair  Language:  Fair  Akathisia:  No  Handed:  Right  AIMS (if indicated):     Assets:  Desire for Improvement  ADL's:  Intact  Cognition:  WNL  Sleep:  Number of Hours: 6.75   03/31/16 CSW was able to obtain collateral information from his community support - who reported patient's baseline a few months ago was much better , he does have chronic  delusions , but he was able to take care of self and participate better in decision making discussions .        Treatment Plan Summary: Ronald AngstBarry D Roman is a 55 year old AA male with history ofparanoid schizophrenia, DM, HTN,who was found walking naked by neighbors, recommended by his case manager to be brought to the hospital for evaluation.  Patient seen as vaguely anxious - preoccupied with discharge  . Pending [facility] placement.   Given his hx of chronic mental illness , recent multiple IP admissions , him being on Clozaril which needs frequent lab monitoring , as well as his inability to take care of his medications and self administer it on a regular basis as well as his inability to follow up on routine lab work up - patient needs to be in a supervised , structured setting - otherwise could decompensate quickly .Patient was tried to be tapered of the clozaril with plan to discharge on a LAI - but patient decompensated tremendously during the trial and hence clozaril was re-initiated . Patient currently is likely stable , at baseline on the current medication regimen - is awaiting GH placement.       Paranoid schizophrenia (HCC)  improving.  Will continue today 04/22/16  plan as below except where it is noted.  Daily contact with patient to assess and evaluate symptoms and progress in treatment and Medication management    Will continue  Clozaril  12.5 mg po daily and clozaril 125 mg po qhs for psychosis.   Reviewed CBC with diff - 04/06/16- ANC- 8.6 /WBC- 12.8  wnl.needs weekly CBC/ANC- 04/13/16-ANC - 8/WBC-12.2  Continue Depakote  ER 1000 mg po qhs for mood lability , although  Depakote level  - 03/25/16- 72 ( therapeutic) ,  depakote level on 03/30/16- I have reviewed depakote level  - 74- therapeutic.Dose reduced to address his tiredness. Repeat Depakote level- 45 - but will not increase it since pt is tired at higher dosage.  Will continue Restoril 15 mg po qhs for sleep.  Will continue to monitor vitals ,medication compliance and treatment side effects while patient is here.   Reviewed labs: EKG - qtc - wnl, TSH- wnl , hba1c- 8- dietician consult once patient is more stable  ,  pl -31.5 ,  lipid panel HDL - low - diet control recommendations to be provided when patient is more stable.  CSW will continue  working on disposition.[facility] .   PPD provided -  04/21/16.  Patient to participate in therapeutic milieu  Candas Deemer, MD  04/22/2016, 1:15 PM

## 2016-04-22 NOTE — [facility] Group Notes (Signed)
[facility] Group Notes:  (Counselor/Nursing/MHT/Case Management/Adjunct)  04/22/2016 1:15PM  Type of Therapy:  Group Therapy  Participation Level:  Active  Participation Quality:  Appropriate  Affect:  Flat  Cognitive:  Oriented  Insight:  Improving  Engagement in Group:  Limited  Engagement in Therapy:  Limited  Modes of Intervention:  Discussion, Exploration and Socialization  Summary of Progress/Problems: The topic for group was balance in life.  Pt participated in the discussion about when their life was in balance and out of balance and how this feels.  Pt discussed ways to get back in balance and short term goals they can work on to get where they want to be. Invited.  Chose to not attend.  Upset with me because he is not leaving today.   Ronald Roman, Ronald Roman 04/22/2016 12:47 PM

## 2016-04-22 NOTE — Progress Notes (Signed)
D:  Patient's self inventory sheet, patient sleeps good, sleep medication is helpful.  Good appetite, hyper energy level, good concentration.  Denied depression, hopeless and anxiety.  Denied withdrawals.  SI, contracts for safety.  Patient denied SI while talking to nurse this morning.  Physical problems, zero.  Pain #10 in past 24 hours.  Pain medication is helpful.  No goals, no plans.  Does have discharge plans. A:  Medications administered per MD orders. R:  Denied SI and HI while talking to nurse this morning, contracts for safety.  Denied A/V hallucinations.  Safety maintained with 15 minute checks.

## 2016-04-22 NOTE — [facility] Group Notes (Signed)
The focus of this group is to educate the patient on the purpose and policies of crisis stabilization and provide a format to answer questions about their admission.  The group details unit policies and expectations of patients while admitted.  Patient attended 0900 nurse education orientation group this morning.  Patient sat in group but did not respond to questions or discussion.  Patient closed his eyes and appeared to be sleeping at times.

## 2016-04-23 LAB — GLUCOSE, CAPILLARY
GLUCOSE-CAPILLARY: 203 mg/dL — AB (ref 65–99)
GLUCOSE-CAPILLARY: 228 mg/dL — AB (ref 65–99)
GLUCOSE-CAPILLARY: 390 mg/dL — AB (ref 65–99)
Glucose-Capillary: 206 mg/dL — ABNORMAL HIGH (ref 65–99)

## 2016-04-23 NOTE — [facility] Group Notes (Signed)
[facility] LCSW Group Therapy  04/23/2016  1:05 PM  Type of Therapy:  Group therapy  Participation Level:  Active  Participation Quality:  Attentive  Affect:  Flat  Cognitive:  Oriented  Insight:  Limited  Engagement in Therapy:  Limited  Modes of Intervention:  Discussion, Socialization  Summary of Progress/Problems:  Chaplain was here to lead a group on themes of hope and courage. Invited.  Chose to not attend.  Daryel Geraldorth, Beck Cofer B 04/23/2016 1:17 PM

## 2016-04-23 NOTE — Progress Notes (Signed)
Did not attend group 

## 2016-04-23 NOTE — Progress Notes (Signed)
Recreation Therapy Notes  Date: 04/23/16 Time: 1000 Location: 500 Hall Dayroom  Group Topic: Communication, Team Building, Problem Solving  Goal Area(s) Addresses:  Patient will effectively work with peer towards shared goal.  Patient will identify skill used to make activity successful.  Patient will identify how skills used during activity can be used to reach post d/c goals.   Behavioral Response: Observed  Intervention: STEM Activity   Activity: [place] [employer]. Patients were provided the following materials: 5 drinking straws, 5 rubber bands, 5 paper clips, 2 index cards, 2 drinking cups, and 2 toilet paper rolls. Using the provided materials patients were asked to build a launching mechanisms to launch a ping pong ball approximately 12 feet. Patients were divided into teams of 3-5.   Education: Pharmacist, communityocial Skills, Building control surveyorDischarge Planning.   Education Outcome: Acknowledges education/In group clarification offered/Needs additional education.   Clinical Observations/Feedback: Pt stated using the skills from the activity would help him "talk to people and comprehend what they say and do as they say".  Pt also stated that his goal was to take care of and love himself better.    Caroll RancherMarjette Myelle Poteat, LRT/CTRS       Caroll RancherLindsay, Hether Anselmo A 04/23/2016 12:40 PM

## 2016-04-23 NOTE — Progress Notes (Signed)
D: Pt A & O to self and place. Presented with depressed affect and mood on initial approach this AM; replied "I just want to go home" when writer inquired. Observed pacing in hall at intervals but remains cooperative with unit routines and care.  A: Scheduled medications administered as prescribed with verbal education. Emotional support and availability provided to pt. Writer encouraged pt to voice concerns, comply with medication and scheduled unit group. Q 15 minutes safety checks maintained without outburst or self harm gestures.   R: Pt brightened up as shift progressed. Verbalized understanding related to medication regimen "yeah, ok" and has been compliant. Denies adverse drug reactions when assessed.  Attended groups on unit and was engaged. Tolerated all PO intake well. Denies concerns at this time. POC maintained for safety and mood stability.

## 2016-04-23 NOTE — Progress Notes (Signed)
Patient ID: Osvaldo AngstBarry D Folkes, male   DOB: 1960/08/13, 55 y.o.   MRN: [id number] D: Client is visible on the unit, seen in dayroom watching TV and walking the hall. Client tangential, disorganized with moments of clarity. Client reports "I'm going home, that's what she told me, they coming to get me" A: Writer provided emotional support, attempted to education client on nutrition and diabetes as client reports he uses eight packs of "sugar substitute" in his coffee. Writer noted that this was just a harmful as using pure sugar, encouraged client to decrease the artificial sweetener to 1 pack per coffee, client disagrees "no I can't do that, I have to use that much cause I take that insulin" Medications reviewed, administered as ordered. Staff will monitor q7715min for safety. R: Client is safe on the unit, attended group.

## 2016-04-23 NOTE — Progress Notes (Signed)
Adult Psychoeducational Group Note  Date:  04/23/2016 Time:  2:05 AM  Group Topic/Focus:  Wrap-Up Group:   The focus of this group is to help patients review their daily goal of treatment and discuss progress on daily workbooks.   Participation Level:  Active  Participation Quality:  Appropriate  Affect:  Appropriate  Cognitive:  Alert  Insight: Appropriate  Engagement in Group:  Engaged  Modes of Intervention:  Discussion  Additional Comments:  Patient states, "I had an alright day". On a scale between 1-10, (1=worse, 10=best) patient rated his day a 10. Patient states, "my day has been a 10 everyday I been here".  Ronald Roman 04/23/2016, 2:05 AM

## 2016-04-23 NOTE — Progress Notes (Signed)
[facility] MD Progress Note  04/23/2016 2:53 PM Ronald Roman  MRN:  [id number]   Subjective: Patient states " I am ok."      Objective: Ronald Roman is a 55 year old AA male with history ofparanoid schizophrenia, DM, HTN,who was found walking naked by neighbors, recommended by his case manager to be brought to the hospital for evaluation.  Patient seen and chart reviewed.Discussed patient with treatment team.  Pt today seen as anxious about his disposition plan . Pt provided reassurance. Pt today tolerating medications well. Per RN - pt had PPD - read as positive today . Will get a chest xray - since its essential for his admission to [facility]. Will order the same.   Principal Problem: Paranoid schizophrenia (HCC)  Diagnosis:   Patient Active Problem List   Diagnosis Date Noted  . Acute psychosis [F23]   . Leukocytosis [D72.829] 02/06/2015  . Sleep apnea [G47.30] 02/05/2015  . AKI (acute kidney injury) (HCC) [N17.9] 02/04/2015  . Seizure disorder (HCC) [G40.909] 02/04/2015  . Acute encephalopathy [G93.40] 02/04/2015  . Diabetes mellitus without complication (HCC) [E11.9]   . Hyperlipidemia [E78.5]   . Hypertension [I10]   . Paranoid schizophrenia (HCC) [F20.0]    Total Time spent with patient: 20 minutes  Past Psychiatric History: Please see H&P.  Past Medical History:  Past Medical History:  Diagnosis Date  . Diabetes mellitus without complication (HCC)   . GERD (gastroesophageal reflux disease)   . Hyperlipidemia   . Hypertension   . Paranoid schizophrenia (HCC)   . Sleep apnea   . Uric acid nephrolithiasis    History reviewed. No pertinent surgical history.  Family History: Patient denies  Family Psychiatric  History: Pt denies  Social History: Patient reports he lives in [place] , is single , has children who does not live with him , reports he gets food stamps . History  Alcohol Use No     History  Drug Use No    Social History   Social History   . Marital status: Single    Spouse name: N/A  . Number of children: N/A  . Years of education: N/A   Social History Main Topics  . Smoking status: Current Every Day Smoker    Packs/day: 1.00    Types: Cigarettes  . Smokeless tobacco: Never Used  . Alcohol use No  . Drug use: No  . Sexual activity: Not Currently   Other Topics Concern  . None   Social History Narrative  . None   Additional Social History:   Sleep: Fair  Appetite:  Fair  Current Medications: Current Facility-Administered Medications  Medication Dose Route Frequency Provider Last Rate Last Dose  . acetaminophen (TYLENOL) tablet 650 mg  650 mg Oral Q6H PRN Kristeen Mans, NP   650 mg at 03/21/16 0329  . amantadine (SYMMETREL) capsule 100 mg  100 mg Oral BID Laveda Abbe, NP   100 mg at 04/23/16 0818  . cloZAPine (CLOZARIL) tablet 12.5 mg  12.5 mg Oral Daily Ephram Kornegay, MD   12.5 mg at 04/23/16 0818  . cloZAPine (CLOZARIL) tablet 125 mg  125 mg Oral QHS Jomarie Longs, MD   125 mg at 04/22/16 2126  . divalproex (DEPAKOTE ER) 24 hr tablet 1,250 mg  1,250 mg Oral QHS Jomarie Longs, MD   1,250 mg at 04/22/16 2125  . hydrocerin (EUCERIN) cream   Topical TID Jomarie Longs, MD      . insulin aspart (novoLOG)  injection 0-15 Units  0-15 Units Subcutaneous TID WC Jomarie LongsSaramma Corynn Solberg, MD   5 Units at 04/23/16 1210  . insulin aspart (novoLOG) injection 0-5 Units  0-5 Units Subcutaneous QHS Jomarie LongsSaramma Naava Janeway, MD   2 Units at 04/21/16 2121  . insulin aspart (novoLOG) injection 4 Units  4 Units Subcutaneous TID WC Jomarie LongsSaramma Jamarion [place]um[place], MD   4 Units at 04/23/16 1211  . insulin glargine (LANTUS) injection 20 Units  20 Units Subcutaneous QHS Jomarie LongsSaramma Durk Carmen, MD   20 Units at 04/22/16 2126  . lisinopril (PRINIVIL,ZESTRIL) tablet 20 mg  20 mg Oral Daily Jomarie LongsSaramma Deshanae Lindo, MD   20 mg at 04/23/16 0818  . nicotine polacrilex (NICORETTE) gum 2 mg  2 mg Oral PRN Jomarie LongsSaramma Laterra Lubinski, MD   2 mg at 04/20/16 0835  . OLANZapine (ZYPREXA) tablet 10 mg   10 mg Oral BID PRN Jomarie LongsSaramma Janiyha Montufar, MD   10 mg at 04/08/16 0839   Or  . OLANZapine (ZYPREXA) injection 10 mg  10 mg Intramuscular BID PRN Jomarie LongsSaramma Jayro Mcmath, MD      . temazepam (RESTORIL) capsule 15 mg  15 mg Oral QHS Jomarie LongsSaramma Sakari Alkhatib, MD   15 mg at 04/22/16 2126  . tuberculin injection 5 Units  5 Units Intradermal Once Jomarie LongsSaramma Marabeth Melland, MD   5 Units at 04/21/16 1831   Lab Results:  Results for orders placed or performed during the hospital encounter of 03/19/16 (from the past 48 hour(s))  Glucose, capillary     Status: Abnormal   Collection Time: 04/21/16  4:51 PM  Result Value Ref Range   Glucose-Capillary 171 (H) 65 - 99 mg/dL  Glucose, capillary     Status: Abnormal   Collection Time: 04/21/16  8:27 PM  Result Value Ref Range   Glucose-Capillary 217 (H) 65 - 99 mg/dL  Glucose, capillary     Status: Abnormal   Collection Time: 04/22/16  6:07 AM  Result Value Ref Range   Glucose-Capillary 215 (H) 65 - 99 mg/dL  Glucose, capillary     Status: Abnormal   Collection Time: 04/22/16 11:51 AM  Result Value Ref Range   Glucose-Capillary 170 (H) 65 - 99 mg/dL  Glucose, capillary     Status: Abnormal   Collection Time: 04/22/16  5:08 PM  Result Value Ref Range   Glucose-Capillary 305 (H) 65 - 99 mg/dL  Glucose, capillary     Status: Abnormal   Collection Time: 04/22/16  8:29 PM  Result Value Ref Range   Glucose-Capillary 138 (H) 65 - 99 mg/dL  Glucose, capillary     Status: Abnormal   Collection Time: 04/23/16  6:18 AM  Result Value Ref Range   Glucose-Capillary 203 (H) 65 - 99 mg/dL  Glucose, capillary     Status: Abnormal   Collection Time: 04/23/16 11:42 AM  Result Value Ref Range   Glucose-Capillary 228 (H) 65 - 99 mg/dL   Blood Alcohol level:  Lab Results  Component Value Date   ETH <5 03/18/2016   ETH <5 03/01/2016   Metabolic Disorder Labs: Lab Results  Component Value Date   HGBA1C 8.0 (H) 03/23/2016   MPG 183 03/23/2016   MPG 183 03/20/2016   Lab Results  Component  Value Date   PROLACTIN 31.5 (H) 03/23/2016   PROLACTIN 36.6 (H) 03/20/2016   Lab Results  Component Value Date   CHOL 145 03/23/2016   TRIG 144 03/23/2016   HDL 30 (L) 03/23/2016   CHOLHDL 4.8 03/23/2016   VLDL 29 03/23/2016   LDLCALC 86  03/23/2016   LDLCALC 21 03/20/2016   Physical Findings: AIMS: Facial and Oral Movements Muscles of Facial Expression: None, normal Lips and Perioral Area: None, normal Jaw: None, normal Tongue: None, normal,Extremity Movements Upper (arms, wrists, hands, fingers): None, normal Lower (legs, knees, ankles, toes): None, normal, Trunk Movements Neck, shoulders, hips: None, normal, Overall Severity Severity of abnormal movements (highest score from questions above): None, normal Incapacitation due to abnormal movements: None, normal Patient's awareness of abnormal movements (rate only patient's report): No Awareness, Dental Status Current problems with teeth and/or dentures?: No Does patient usually wear dentures?: No  CIWA:  CIWA-Ar Total: 1 COWS:  COWS Total Score: 3  Musculoskeletal: Strength & Muscle Tone: within normal limits Gait & Station: normal Patient leans: N/A   Psychiatric Specialty Exam: Physical Exam  Nursing note and vitals reviewed.   Review of Systems  Psychiatric/Behavioral: The patient is nervous/anxious.   All other systems reviewed and are negative.   Blood pressure 137/87, pulse (!) 116, temperature 99 F (37.2 C), temperature source Oral, resp. rate 18, height 5' 5.5" (1.664 m), weight 91.6 kg (202 lb), SpO2 100 %.Body mass index is 33.1 kg/m.  General Appearance: Guarded  Eye Contact:  Fair  Speech:  Normal Rate   Volume:  Decreased  Mood:  Anxious about being here  Affect:  Appropriate   Thought Process:  Linear and Descriptions of Associations: Circumstantial   Orientation:  Full (Time, Place, and Person)  Thought Content:  Delusions on and off  Suicidal Thoughts:  No   Homicidal Thoughts:  No  Memory:   Immediate;   Fair Recent;   Poor Remote;   Poor  Judgement:  Fair  Insight:  Shallow  Psychomotor Activity:  Normal improving  Concentration:  Concentration: Fair and Attention Span: Fair  Recall:  [employer]is[employer]Fair  Fund of Knowledge:  Fair  Language:  Fair  Akathisia:  No  Handed:  Right  AIMS (if indicated):     Assets:  Desire for Improvement  ADL's:  Intact  Cognition:  WNL  Sleep:  Number of Hours: 6.25   03/31/16 CSW was able to obtain collateral information from his community support - who reported patient's baseline a few months ago was much better , he does have chronic delusions , but he was able to take care of self and participate better in decision making discussions .        Treatment Plan Summary: Osvaldo AngstBarry D Wedge is a 55 year old AA male with history ofparanoid schizophrenia, DM, HTN,who was found walking naked by neighbors, recommended by his case manager to be brought to the hospital for evaluation.  Patient seen as anxious about his discharge plan - [facility]     Given his hx of chronic mental illness , recent multiple IP admissions , him being on Clozaril which needs frequent lab monitoring , as well as his inability to take care of his medications and self administer it on a regular basis as well as his inability to follow up on routine lab work up - patient needs to be in a supervised , structured setting - otherwise could decompensate quickly .Patient was tried to be tapered of the clozaril with plan to discharge on a LAI - but patient decompensated tremendously during the trial and hence clozaril was re-initiated . Patient currently is likely stable , at baseline on the current medication regimen - is awaiting GH placement.  Paranoid schizophrenia (HCC)  improving.  Will continue today 04/23/16  plan as below except where it is noted.  Daily contact with patient to assess and evaluate symptoms and progress  in treatment and Medication management    Will continue  Clozaril  12.5 mg po daily and clozaril 125 mg po qhs for psychosis.   Reviewed CBC with diff - 04/06/16- ANC- 8.6 /WBC- 12.8  wnl.needs weekly CBC/ANC- 04/13/16-ANC - 8/WBC-12.2  Continue Depakote  ER 1000 mg po qhs for mood lability , although  Depakote level  - 03/25/16- 72 ( therapeutic) ,  depakote level on 03/30/16- I have reviewed depakote level  - 74- therapeutic.Dose reduced to address his tiredness. Repeat Depakote level- 45 - but will not increase it since pt is tired at higher dosage.  Will continue Restoril 15 mg po qhs for sleep.  Will continue to monitor vitals ,medication compliance and treatment side effects while patient is here.   Reviewed labs: EKG - qtc - wnl, TSH- wnl , hba1c- 8- dietician consult once patient is more stable  ,  pl -31.5 ,  lipid panel HDL - low - diet control recommendations to be provided when patient is more stable.  CSW will continue  working on disposition.Patient referred for [facility] placement .   PPD provided - 04/21/16- positive - will get chest xray today since he has to be placed at [facility]  Patient to participate in therapeutic milieu  Webb Weed, MD  04/23/2016, 2:53 PM

## 2016-04-24 LAB — GLUCOSE, CAPILLARY
GLUCOSE-CAPILLARY: 204 mg/dL — AB (ref 65–99)
GLUCOSE-CAPILLARY: 168 mg/dL — AB (ref 65–99)
GLUCOSE-CAPILLARY: 382 mg/dL — AB (ref 65–99)
Glucose-Capillary: 324 mg/dL — ABNORMAL HIGH (ref 65–99)

## 2016-04-24 NOTE — Progress Notes (Signed)
DAR NOTE: Patient presents with anxious affect and depressed mood.  Denies pain, auditory and visual hallucinations.  Rates depression at 0, hopelessness at 0, and anxiety at 0.  Maintained on routine safety checks.  Medications given as prescribed.  Support and encouragement offered as needed.  Attended group and participated.  Patient observed socializing with peers in the dayroom.  Offered no complaint.    

## 2016-04-24 NOTE — Plan of Care (Signed)
Problem: Education: Goal: Knowledge of the prescribed therapeutic regimen will improve Outcome: Progressing Client knowledge of prescribed therapeutic regimen will improve AEB acknowledging medication regime, identifying pills when being administered,"I know what that is".

## 2016-04-24 NOTE — [facility] Group Notes (Signed)
[facility] Group Notes: (Clinical Social Work)   04/24/2016      Type of Therapy:  Group Therapy   Participation Level:  Did Not Attend despite MHT prompting   Hatsumi Steinhart Grossman-Orr, LCSW 04/24/2016, 12:46 PM     

## 2016-04-24 NOTE — Progress Notes (Signed)
[facility]  04/24/2016 3:15 PM Ronald AngstBarry D Roman  MRN:  [id number]  Subjective: Ronald PrayBarry states " I am alright."  Objective: Ronald AngstBarry D Sinn is a 55 year old AA male with history ofparanoid schizophrenia, DM, HTN,who was found walking naked by neighbors, recommended by his case manager to be brought to the hospital for evaluation.  Patient seen and chart reviewed. Discussed patient with treatment team.  Pt today seen as anxious about his disposition plan. He says he wants to go home after discharge. He denies any issues. He says he tries to attend some group sessions, but not interested in some of the group sessions.  Pt provided with reassurance. Pt today tolerating medications well. Per RN - pt had PPD - read as positive today . Will get a chest xray - [facility] Result pending.  Principal Problem: Paranoid schizophrenia (HCC)  Diagnosis:   Patient Active Problem List   Diagnosis Date Noted  . Acute psychosis [F23]   . Leukocytosis [D72.829] 02/06/2015  . Sleep apnea [G47.30] 02/05/2015  . AKI (acute kidney injury) (HCC) [N17.9] 02/04/2015  . Seizure disorder (HCC) [G40.909] 02/04/2015  . Acute encephalopathy [G93.40] 02/04/2015  . Diabetes mellitus without complication (HCC) [E11.9]   . Hyperlipidemia [E78.5]   . Hypertension [I10]   . Paranoid schizophrenia (HCC) [F20.0]    Total Time spent with patient: 15 minutes  Past Psychiatric History: Please see H&P.  Past Medical History:  Past Medical History:  Diagnosis Date  . Diabetes mellitus without complication (HCC)   . GERD (gastroesophageal reflux disease)   . Hyperlipidemia   . Hypertension   . Paranoid schizophrenia (HCC)   . Sleep apnea   . Uric acid nephrolithiasis    History reviewed. No pertinent surgical history.  Family History: Patient denies  Family Psychiatric  History: Pt denies  Social History: Patient reports he lives in [place] , is single , has  children who does not live with him , reports he gets food stamps . History  Alcohol Use No     History  Drug Use No    Social History   Social History  . Marital status: Single    Spouse name: N/A  . Number of children: N/A  . Years of education: N/A   Social History Main Topics  . Smoking status: Current Every Day Smoker    Packs/day: 1.00    Types: Cigarettes  . Smokeless tobacco: Never Used  . Alcohol use No  . Drug use: No  . Sexual activity: Not Currently   Other Topics Concern  . None   Social History Narrative  . None   Additional Social History:   Sleep: Fair  Appetite:  Fair  Current Medications: Current Facility-Administered Medications  Medication Dose Route Frequency Provider Last Rate Last Dose  . acetaminophen (TYLENOL) tablet 650 mg  650 mg Oral Q6H PRN Kristeen MansFran E Hobson, NP   650 mg at 03/21/16 0329  . amantadine (SYMMETREL) capsule 100 mg  100 mg Oral BID Laveda AbbeLaurie Britton Parks, NP   100 mg at 04/24/16 0846  . cloZAPine (CLOZARIL) tablet 12.5 mg  12.5 mg Oral Daily Jomarie LongsSaramma Eappen, MD   12.5 mg at 04/24/16 0846  . cloZAPine (CLOZARIL) tablet 125 mg  125 mg Oral QHS Jomarie LongsSaramma Eappen, MD   125 mg at 04/23/16 2119  . divalproex (DEPAKOTE ER) 24 hr tablet 1,250 mg  1,250 mg Oral QHS Jomarie LongsSaramma Eappen, MD   1,250  mg at 04/23/16 2119  . hydrocerin (EUCERIN) cream   Topical TID Jomarie Longs, MD      . insulin aspart (novoLOG) injection 0-15 Units  0-15 Units Subcutaneous TID WC Jomarie Longs, MD   3 Units at 04/24/16 1211  . insulin aspart (novoLOG) injection 0-5 Units  0-5 Units Subcutaneous QHS Jomarie Longs, MD   2 Units at 04/23/16 2123  . insulin aspart (novoLOG) injection 4 Units  4 Units Subcutaneous TID WC Jomarie Longs, MD   4 Units at 04/24/16 1211  . insulin glargine (LANTUS) injection 20 Units  20 Units Subcutaneous QHS Jomarie Longs, MD   20 Units at 04/23/16 2124  . lisinopril (PRINIVIL,ZESTRIL) tablet 20 mg  20 mg Oral Daily Saramma Eappen, MD   20  mg at 04/24/16 0846  . nicotine polacrilex (NICORETTE) gum 2 mg  2 mg Oral PRN Jomarie Longs, MD   2 mg at 04/20/16 0835  . OLANZapine (ZYPREXA) tablet 10 mg  10 mg Oral BID PRN Jomarie Longs, MD   10 mg at 04/08/16 0839   Or  . OLANZapine (ZYPREXA) injection 10 mg  10 mg Intramuscular BID PRN Jomarie Longs, MD      . temazepam (RESTORIL) capsule 15 mg  15 mg Oral QHS Jomarie Longs, MD   15 mg at 04/23/16 2204   Lab Results:  Results for orders placed or performed during the hospital encounter of 03/19/16 (from the past 48 hour(s))  Glucose, capillary     Status: Abnormal   Collection Time: 04/22/16  5:08 PM  Result Value Ref Range   Glucose-Capillary 305 (H) 65 - 99 mg/dL  Glucose, capillary     Status: Abnormal   Collection Time: 04/22/16  8:29 PM  Result Value Ref Range   Glucose-Capillary 138 (H) 65 - 99 mg/dL  Glucose, capillary     Status: Abnormal   Collection Time: 04/23/16  6:18 AM  Result Value Ref Range   Glucose-Capillary 203 (H) 65 - 99 mg/dL  Glucose, capillary     Status: Abnormal   Collection Time: 04/23/16 11:42 AM  Result Value Ref Range   Glucose-Capillary 228 (H) 65 - 99 mg/dL  Glucose, capillary     Status: Abnormal   Collection Time: 04/23/16  5:03 PM  Result Value Ref Range   Glucose-Capillary 390 (H) 65 - 99 mg/dL   Comment 1 Notify RN    Comment 2 Document in Chart   Glucose, capillary     Status: Abnormal   Collection Time: 04/23/16  8:56 PM  Result Value Ref Range   Glucose-Capillary 206 (H) 65 - 99 mg/dL  Glucose, capillary     Status: Abnormal   Collection Time: 04/24/16  6:19 AM  Result Value Ref Range   Glucose-Capillary 204 (H) 65 - 99 mg/dL  Glucose, capillary     Status: Abnormal   Collection Time: 04/24/16 12:09 PM  Result Value Ref Range   Glucose-Capillary 168 (H) 65 - 99 mg/dL   Blood Alcohol level:  Lab Results  Component Value Date   ETH <5 03/18/2016   ETH <5 03/01/2016   Metabolic Disorder Labs: Lab Results  Component  Value Date   HGBA1C 8.0 (H) 03/23/2016   MPG 183 03/23/2016   MPG 183 03/20/2016   Lab Results  Component Value Date   PROLACTIN 31.5 (H) 03/23/2016   PROLACTIN 36.6 (H) 03/20/2016   Lab Results  Component Value Date   CHOL 145 03/23/2016   TRIG 144 03/23/2016  HDL 30 (L) 03/23/2016   CHOLHDL 4.8 03/23/2016   VLDL 29 03/23/2016   LDLCALC 86 03/23/2016   LDLCALC 21 03/20/2016   Physical Findings: AIMS: Facial and Oral Movements Muscles of Facial Expression: None, normal Lips and Perioral Area: None, normal Jaw: None, normal Tongue: None, normal,Extremity Movements Upper (arms, wrists, hands, fingers): None, normal Lower (legs, knees, ankles, toes): None, normal, Trunk Movements Neck, shoulders, hips: None, normal, Overall Severity Severity of abnormal movements (highest score from questions above): None, normal Incapacitation due to abnormal movements: None, normal Patient's awareness of abnormal movements (rate only patient's report): No Awareness, Dental Status Current problems with teeth and/or dentures?: No Does patient usually wear dentures?: No  CIWA:  CIWA-Ar Total: 1 COWS:  COWS Total Score: 3  Musculoskeletal: Strength & Muscle Tone: within normal limits Gait & Station: normal Patient leans: N/A   Psychiatric Specialty Exam: Physical Exam  Nursing note and vitals reviewed.   Review of Systems  Psychiatric/Behavioral: The patient is nervous/anxious.   All other systems reviewed and are negative.   Blood pressure 135/76, pulse 94, temperature 98.2 F (36.8 C), temperature source Oral, resp. rate 16, height 5' 5.5" (1.664 m), weight 91.6 kg (202 lb), SpO2 100 %.Body mass index is 33.1 kg/m.  General Appearance: Guarded  Eye Contact:  Fair  Speech:  Normal Rate   Volume:  Decreased  Mood:  Anxious about being here  Affect:  Appropriate   Thought Process:  Linear and Descriptions of Associations: Circumstantial   Orientation:  Full (Time, Place, and  Person)  Thought Content:  Delusions on and off  Suicidal Thoughts:  No   Homicidal Thoughts:  No  Memory:  Immediate;   Fair Recent;   Poor Remote;   Poor  Judgement:  Fair  Insight:  Shallow  Psychomotor Activity:  Normal improving  Concentration:  Concentration: Fair and Attention Span: Fair  Recall:  FiservFair  Fund of Knowledge:  Fair  Language:  Fair  Akathisia:  No  Handed:  Right  AIMS (if indicated):     Assets:  Desire for Improvement  ADL's:  Intact  Cognition:  WNL  Sleep:  Number of Hours: 5   03/31/16 CSW was able to obtain collateral information from his community support - who reported patient's baseline a few months ago was much better , he does have chronic delusions , but he was able to take care of self and participate better in decision making discussions .   Treatment Plan Summary: Ronald Roman is a 55 year old AA male with history ofparanoid schizophrenia, DM, HTN,who was found walking naked by neighbors, recommended by his case manager to be brought to the hospital for evaluation.  Patient seen as anxious about his discharge plan - [facility]   Given his hx of chronic mental illness , recent multiple IP admissions , him being on Clozaril which needs frequent lab monitoring , as well as his inability to take care of his medications and self administer it on a regular basis as well as his inability to follow up on routine lab work up - patient needs to be in a supervised , structured setting - otherwise could decompensate quickly .Patient was tried to be tapered of the clozaril with plan to discharge on a LAI - but patient decompensated tremendously during the trial and hence clozaril was re-initiated . Patient currently is likely stable , at baseline on the current  medication regimen - is awaiting GH placement.  Paranoid schizophrenia (HCC)  improving.  Will continue today 04/24/16  plan as below except where  it is noted.  Daily contact with patient to assess and evaluate symptoms and progress in treatment and Medication management    Will continue  Clozaril  12.5 mg po daily and clozaril 125 mg po qhs for psychosis.   Reviewed CBC with diff - 04/06/16- ANC- 8.6 /WBC- 12.8  wnl.needs weekly CBC/ANC- 04/13/16-ANC - 8/WBC-12.2  Continue Depakote  ER 1000 mg po qhs for mood lability , although  Depakote level  - 03/25/16- 72 ( therapeutic) ,  depakote level on 03/30/16- I have reviewed depakote level  - 74- therapeutic.Dose reduced to address his tiredness. Repeat Depakote level- 45 - but will not increase it since pt is tired at higher dosage.  Will continue Restoril 15 mg po qhs for sleep.  Will continue to monitor vitals ,medication compliance and treatment side effects while patient is here.   Reviewed labs: EKG - qtc - wnl, TSH- wnl , hba1c- 8- dietician consult once patient is more stable  ,  pl -31.5 ,  lipid panel HDL - low - diet control recommendations to be provided when patient is more stable.  CSW will continue  working on disposition.Patient referred [facility] placement .   PPD provided - 04/21/16- positive - will get chest xray today since he has to be placed at [facility].  Patient to participate in therapeutic milieu.  No changes made on the current plan of care. Continue treatment plan.   Sanjuana Kava, NP , PMHNP, FNP-BC. 04/24/2016, 3:15 PM  I agree to the findings and plan

## 2016-04-24 NOTE — Progress Notes (Signed)
D   Pt irritable and demanding his medications at the beginning of the shift because he said "she" said I am leaving tonight and I need to take my medications before I go    After redirection pt was calmer and went to the dayroom for snack  A   Verbal support and redirection given   Medications administered and effectiveness monitored    Q 15 min checks  R    Pt is presently safe and receptive to verbal support and redirection

## 2016-04-25 ENCOUNTER — Inpatient Hospital Stay (HOSPITAL_COMMUNITY): Admit: 2016-04-25 | Payer: Self-pay

## 2016-04-25 LAB — CBC WITH DIFFERENTIAL/PLATELET
BASOS ABS: 0 10*3/uL (ref 0.0–0.1)
Basophils Relative: 0 %
EOS ABS: 0.2 10*3/uL (ref 0.0–0.7)
Eosinophils Relative: 2 %
HCT: 34.8 % — ABNORMAL LOW (ref 39.0–52.0)
Hemoglobin: 11.2 g/dL — ABNORMAL LOW (ref 13.0–17.0)
LYMPHS PCT: 22 %
Lymphs Abs: 2.6 10*3/uL (ref 0.7–4.0)
MCH: 26.2 pg (ref 26.0–34.0)
MCHC: 32.2 g/dL (ref 30.0–36.0)
MCV: 81.5 fL (ref 78.0–100.0)
MONO ABS: 1.3 10*3/uL — AB (ref 0.1–1.0)
Monocytes Relative: 11 %
NEUTROS ABS: 7.6 10*3/uL (ref 1.7–7.7)
Neutrophils Relative %: 65 %
PLATELETS: 293 10*3/uL (ref 150–400)
RBC: 4.27 MIL/uL (ref 4.22–5.81)
RDW: 14.9 % (ref 11.5–15.5)
WBC: 11.7 10*3/uL — ABNORMAL HIGH (ref 4.0–10.5)

## 2016-04-25 LAB — GLUCOSE, CAPILLARY
GLUCOSE-CAPILLARY: 226 mg/dL — AB (ref 65–99)
GLUCOSE-CAPILLARY: 205 mg/dL — AB (ref 65–99)
Glucose-Capillary: 232 mg/dL — ABNORMAL HIGH (ref 65–99)
Glucose-Capillary: 201 mg/dL — ABNORMAL HIGH (ref 65–99)

## 2016-04-25 NOTE — Progress Notes (Signed)
DAR NOTE: Patient presents with anxious affect and depressed mood.  Denies pain, auditory and visual hallucinations.  Rates depression at 0, hopelessness at 0, and anxiety at 0.  Maintained on routine safety checks.  Medications given as prescribed.  Support and encouragement offered as needed. Patient observed socializing with peers in the dayroom.  Offered no complaint.    

## 2016-04-25 NOTE — Progress Notes (Signed)
[facility] MD Progress Note  04/25/2016 3:08 PM GEORGIA BARIA  MRN:  [id number]  Subjective: [name] states " I am alright."  Objective: JAHLEN BOLLMAN is a 55 year old AA male with history ofparanoid schizophrenia, DM, HTN,who was found walking naked by neighbors, recommended by his case manager to be brought to the hospital for evaluation.  Patient seen and chart reviewed. Discussed patient with treatment team.  Pt today seen as calm in his room. He says he wants to go home after discharge. He denies any issues. He says he tries to attend some group sessions, but not interested in some of the group sessions.  Pt provided with reassurance. Pt today tolerating medications well. Per RN - pt had PPD - read as positive today . Will get a chest xray - since its essential for his admission to [facility]. Result pending.  Principal Problem: Paranoid schizophrenia (HCC)  Diagnosis:   Patient Active Problem List   Diagnosis Date Noted  . Acute psychosis [F23]   . Leukocytosis [D72.829] 02/06/2015  . Sleep apnea [G47.30] 02/05/2015  . AKI (acute kidney injury) (HCC) [N17.9] 02/04/2015  . Seizure disorder (HCC) [G40.909] 02/04/2015  . Acute encephalopathy [G93.40] 02/04/2015  . Diabetes mellitus without complication (HCC) [E11.9]   . Hyperlipidemia [E78.5]   . Hypertension [I10]   . Paranoid schizophrenia (HCC) [F20.0]    Total Time spent with patient: 15 minutes  Past Psychiatric History: Please see H&P.  Past Medical History:  Past Medical History:  Diagnosis Date  . Diabetes mellitus without complication (HCC)   . GERD (gastroesophageal reflux disease)   . Hyperlipidemia   . Hypertension   . Paranoid schizophrenia (HCC)   . Sleep apnea   . Uric acid nephrolithiasis    History reviewed. No pertinent surgical history.  Family History: Patient denies  Family Psychiatric  History: Pt denies  Social History: [place] , is single , has children who does not  live with him , reports he gets food stamps . History  Alcohol Use No     History  Drug Use No    Social History   Social History  . Marital status: Single    Spouse name: N/A  . Number of children: N/A  . Years of education: N/A   Social History Main Topics  . Smoking status: Current Every Day Smoker    Packs/day: 1.00    Types: Cigarettes  . Smokeless tobacco: Never Used  . Alcohol use No  . Drug use: No  . Sexual activity: Not Currently   Other Topics Concern  . None   Social History Narrative  . None   Additional Social History:   Sleep: Fair  Appetite:  Fair  Current Medications: Current Facility-Administered Medications  Medication Dose Route Frequency Provider Last Rate Last Dose  . acetaminophen (TYLENOL) tablet 650 mg  650 mg Oral Q6H PRN Kristeen Mans, NP   650 mg at 03/21/16 0329  . amantadine (SYMMETREL) capsule 100 mg  100 mg Oral BID Laveda Abbe, NP   100 mg at 04/25/16 0802  . cloZAPine (CLOZARIL) tablet 12.5 mg  12.5 mg Oral Daily Saramma Eappen, MD   12.5 mg at 04/25/16 0801  . cloZAPine (CLOZARIL) tablet 125 mg  125 mg Oral QHS Jomarie Longs, MD   125 mg at 04/24/16 2042  . divalproex (DEPAKOTE ER) 24 hr tablet 1,250 mg  1,250 mg Oral QHS Jomarie Longs, MD   1,250 mg  at 04/24/16 2042  . hydrocerin (EUCERIN) cream   Topical TID Jomarie Longs, MD      . insulin aspart (novoLOG) injection 0-15 Units  0-15 Units Subcutaneous TID WC Jomarie Longs, MD   5 Units at 04/25/16 1213  . insulin aspart (novoLOG) injection 0-5 Units  0-5 Units Subcutaneous QHS Jomarie Longs, MD   5 Units at 04/24/16 2045  . insulin aspart (novoLOG) injection 4 Units  4 Units Subcutaneous TID WC Jomarie Longs, MD   4 Units at 04/25/16 1213  . insulin glargine (LANTUS) injection 20 Units  20 Units Subcutaneous QHS Jomarie Longs, MD   20 Units at 04/24/16 2046  . lisinopril (PRINIVIL,ZESTRIL) tablet 20 mg  20 mg Oral Daily Jomarie Longs, MD   20 mg at 04/25/16 0802   . nicotine polacrilex (NICORETTE) gum 2 mg  2 mg Oral PRN Jomarie Longs, MD   2 mg at 04/20/16 0835  . OLANZapine (ZYPREXA) tablet 10 mg  10 mg Oral BID PRN Jomarie Longs, MD   10 mg at 04/08/16 0839   Or  . OLANZapine (ZYPREXA) injection 10 mg  10 mg Intramuscular BID PRN Jomarie Longs, MD      . temazepam (RESTORIL) capsule 15 mg  15 mg Oral QHS Jomarie Longs, MD   15 mg at 04/24/16 2041   Lab Results:  Results for orders placed or performed during the hospital encounter of 03/19/16 (from the past 48 hour(s))  Glucose, capillary     Status: Abnormal   Collection Time: 04/23/16  5:03 PM  Result Value Ref Range   Glucose-Capillary 390 (H) 65 - 99 mg/dL   Comment 1 Notify RN    Comment 2 Document in Chart   Glucose, capillary     Status: Abnormal   Collection Time: 04/23/16  8:56 PM  Result Value Ref Range   Glucose-Capillary 206 (H) 65 - 99 mg/dL  Glucose, capillary     Status: Abnormal   Collection Time: 04/24/16  6:19 AM  Result Value Ref Range   Glucose-Capillary 204 (H) 65 - 99 mg/dL  Glucose, capillary     Status: Abnormal   Collection Time: 04/24/16 12:09 PM  Result Value Ref Range   Glucose-Capillary 168 (H) 65 - 99 mg/dL  Glucose, capillary     Status: Abnormal   Collection Time: 04/24/16  5:16 PM  Result Value Ref Range   Glucose-Capillary 324 (H) 65 - 99 mg/dL   Comment 1 Notify RN    Comment 2 Document in Chart   Glucose, capillary     Status: Abnormal   Collection Time: 04/24/16  8:25 PM  Result Value Ref Range   Glucose-Capillary 382 (H) 65 - 99 mg/dL   Comment 1 Notify RN   Glucose, capillary     Status: Abnormal   Collection Time: 04/25/16  6:06 AM  Result Value Ref Range   Glucose-Capillary 226 (H) 65 - 99 mg/dL   Comment 1 Notify RN   Glucose, capillary     Status: Abnormal   Collection Time: 04/25/16 11:32 AM  Result Value Ref Range   Glucose-Capillary 232 (H) 65 - 99 mg/dL   Blood Alcohol level:  Lab Results  Component Value Date   ETH <5  03/18/2016   ETH <5 03/01/2016   Metabolic Disorder Labs: Lab Results  Component Value Date   HGBA1C 8.0 (H) 03/23/2016   MPG 183 03/23/2016   MPG 183 03/20/2016   Lab Results  Component Value Date   PROLACTIN  31.5 (H) 03/23/2016   PROLACTIN 36.6 (H) 03/20/2016   Lab Results  Component Value Date   CHOL 145 03/23/2016   TRIG 144 03/23/2016   HDL 30 (L) 03/23/2016   CHOLHDL 4.8 03/23/2016   VLDL 29 03/23/2016   LDLCALC 86 03/23/2016   LDLCALC 21 03/20/2016   Physical Findings: AIMS: Facial and Oral Movements Muscles of Facial Expression: None, normal Lips and Perioral Area: None, normal Jaw: None, normal Tongue: None, normal,Extremity Movements Upper (arms, wrists, hands, fingers): None, normal Lower (legs, knees, ankles, toes): None, normal, Trunk Movements Neck, shoulders, hips: None, normal, Overall Severity Severity of abnormal movements (highest score from questions above): None, normal Incapacitation due to abnormal movements: None, normal Patient's awareness of abnormal movements (rate only patient's report): No Awareness, Dental Status Current problems with teeth and/or dentures?: No Does patient usually wear dentures?: No  CIWA:  CIWA-Ar Total: 1 COWS:  COWS Total Score: 3  Musculoskeletal: Strength & Muscle Tone: within normal limits Gait & Station: normal Patient leans: N/A   Psychiatric Specialty Exam: Physical Exam  Nursing note and vitals reviewed.   Review of Systems  Psychiatric/Behavioral: The patient is nervous/anxious.   All other systems reviewed and are negative.   Blood pressure (!) 149/86, pulse (!) 105, temperature 98.4 F (36.9 C), temperature source Oral, resp. rate 16, height 5' 5.5" (1.664 m), weight 91.6 kg (202 lb), SpO2 100 %.Body mass index is 33.1 kg/m.  General Appearance: Guarded  Eye Contact:  Fair  Speech:  Normal Rate   Volume:  Decreased  Mood:  Anxious about being here  Affect:  Appropriate   Thought Process:   Linear and Descriptions of Associations: Circumstantial   Orientation:  Full (Time, Place, and Person)  Thought Content:  Delusions on and off  Suicidal Thoughts:  No   Homicidal Thoughts:  No  Memory:  Immediate;   Fair Recent;   Poor Remote;   Poor  Judgement:  Fair  Insight:  Shallow  Psychomotor Activity:  Normal improving  Concentration:  Concentration: Fair and Attention Span: Fair  Recall:  FiservFair  Fund of Knowledge:  Fair  Language:  Fair  Akathisia:  No  Handed:  Right  AIMS (if indicated):     Assets:  Desire for Improvement  ADL's:  Intact  Cognition:  WNL  Sleep:  Number of Hours: 6   03/31/16 CSW was able to obtain collateral information from his community support - who reported patient's baseline a few months ago was much better , he does have chronic delusions , but he was able to take care of self and participate better in decision making discussions .   Treatment Plan Summary: Osvaldo AngstBarry D Fano is a 55 year old AA male with history ofparanoid schizophrenia, DM, HTN,who was found walking naked by neighbors, recommended by his case manager to be brought to the hospital for evaluation.  Patient seen as anxious about his discharge plan - reports [facility] - pending placement.   Given his hx of chronic mental illness , recent multiple IP admissions , him being on Clozaril which needs frequent lab monitoring , as well as his inability to take care of his medications and self administer it on a regular basis as well as his inability to follow up on routine lab work up - patient needs to be in a supervised , structured setting - otherwise could decompensate quickly .Patient was tried to be tapered of the clozaril with  plan to discharge on a LAI - but patient decompensated tremendously during the trial and hence clozaril was re-initiated . Patient currently is likely stable , at baseline on the current medication regimen - is awaiting [facility]  placement.  Paranoid schizophrenia (HCC)  improving.  Will continue today 04/25/16  plan as below except where it is noted.  Daily contact with patient to assess and evaluate symptoms and progress in treatment and Medication management    Will continue  Clozaril  12.5 mg po daily and clozaril 125 mg po qhs for psychosis.   Reviewed CBC with diff - 04/06/16- ANC- 8.6 /WBC- 12.8  wnl.needs weekly CBC/ANC- 04/13/16-ANC - 8/WBC-12.2  Continue Depakote  ER 1000 mg po qhs for mood lability , although  Depakote level  - 03/25/16- 72 ( therapeutic) ,  depakote level on 03/30/16- I have reviewed depakote level  - 74- therapeutic.Dose reduced to address his tiredness. Repeat Depakote level- 45 - but will not increase it since pt is tired at higher dosage.  Will continue Restoril 15 mg po qhs for sleep.  Will continue to monitor vitals ,medication compliance and treatment side effects while patient is here.   Reviewed labs: EKG - qtc - wnl, TSH- wnl , hba1c- 8- dietician consult once patient is more stable  ,  pl -31.5 ,  lipid panel HDL - low - diet control recommendations to be provided when patient is more stable.  CSW will continue  working on disposition.[facility] .   PPD provided - 04/21/16- positive - will get chest xray today since [facility]  Patient to participate in therapeutic milieu.  No changes made on the current plan of care. Continue treatment plan.   Sanjuana KavaNwoko, Agnes I, NP , PMHNP, FNP-BC. 04/25/2016, 3:08 PM  I agree to the notes and plan.

## 2016-04-25 NOTE — [facility] Group Notes (Signed)
[facility] Group Notes: (Clinical Social Work)   04/25/2016      Type of Therapy:  Group Therapy   Participation Level:  Did Not Attend despite MHT prompting   Enisa Runyan Grossman-Orr, LCSW 04/25/2016, 11:35 AM     

## 2016-04-25 NOTE — Progress Notes (Signed)
[facility] Group Notes:  (Nursing/MHT/Case Management/Adjunct)  Date:  04/25/2016  Time:  8:59 PM  Type of Therapy:  Psychoeducational Skills  Participation Level:  Active  Participation Quality:  Monopolizing  Affect:  Irritable  Cognitive:  Disorganized  Insight:  Lacking  Engagement in Group:  Distracting  Modes of Intervention:  Education  Summary of Progress/Problems: The patient was unable to discuss anything with regards to his day. He spoke of being a parent of two boys and that he loves them very much. He states that he can not rely on others. In terms of the theme for the day, his support sytem will consist of himself.   Hazle CocaGOODMAN, Laren Whaling S 04/25/2016, 8:59 PM

## 2016-04-25 NOTE — Progress Notes (Signed)
D   Pt is pleasant on approach and cooperative   He continues to talk about being discharged and asked for his medications early again this evening     He insists he is leaving tonight even though we have had several conversations about his discharge tomorrow A    Verbal support and redirection given   Medications administered and effectiveness monitored    Q 15 min checks R   Pt safe at present time and responds fairly well to redirection

## 2016-04-26 ENCOUNTER — Ambulatory Visit (HOSPITAL_COMMUNITY)
Admission: RE | Admit: 2016-04-26 | Discharge: 2016-04-26 | Disposition: A | Discharge status: Home / Self Care | Payer: Medicaid Other | Source: Ambulatory Visit | Attending: Psychiatry | Admitting: Psychiatry

## 2016-04-26 DIAGNOSIS — Z111 Encounter for screening for respiratory tuberculosis: Secondary | ICD-10-CM | POA: Diagnosis not present

## 2016-04-26 LAB — GLUCOSE, CAPILLARY
GLUCOSE-CAPILLARY: 168 mg/dL — AB (ref 65–99)
GLUCOSE-CAPILLARY: 185 mg/dL — AB (ref 65–99)
GLUCOSE-CAPILLARY: 242 mg/dL — AB (ref 65–99)
Glucose-Capillary: 315 mg/dL — ABNORMAL HIGH (ref 65–99)

## 2016-04-26 LAB — QUANTIFERON IN TUBE
QFT TB AG MINUS NIL VALUE: 0.44 IU/mL
QUANTIFERON MITOGEN VALUE: >10 IU/mL
QUANTIFERON NIL VALUE: 0.44 [IU]/mL
QUANTIFERON TB AG VALUE: 0.88 IU/mL
QUANTIFERON TB GOLD: POSITIVE — AB

## 2016-04-26 LAB — QUANTIFERON TB GOLD ASSAY (BLOOD)

## 2016-04-26 MED ORDER — CLOZAPINE 25 MG PO TABS: 12.5000 mg | ORAL_TABLET | Freq: Every day | ORAL | 0 refills | Status: DC

## 2016-04-26 MED ORDER — LISINOPRIL 20 MG PO TABS: 20.0000 mg | ORAL_TABLET | Freq: Every day | ORAL | 0 refills | Status: DC

## 2016-04-26 MED ORDER — AMANTADINE HCL 100 MG PO CAPS: 100.0000 mg | ORAL_CAPSULE | Freq: Two times a day (BID) | ORAL | 0 refills | Status: DC

## 2016-04-26 MED ORDER — INSULIN GLARGINE 100 UNIT/ML ~~LOC~~ SOLN: 20.0000 [IU] | Freq: Every day | SUBCUTANEOUS | 0 refills | Status: DC

## 2016-04-26 MED ORDER — INSULIN ASPART 100 UNIT/ML ~~LOC~~ SOLN: 4.0000 [IU] | Freq: Three times a day (TID) | SUBCUTANEOUS | 11 refills | Status: DC

## 2016-04-26 MED ORDER — TEMAZEPAM 15 MG PO CAPS: 15.0000 mg | ORAL_CAPSULE | Freq: Every day | ORAL | 0 refills | Status: DC

## 2016-04-26 MED ORDER — DIVALPROEX SODIUM ER 250 MG PO TB24: 1250.0000 mg | ORAL_TABLET | Freq: Every day | ORAL | 0 refills | Status: DC

## 2016-04-26 NOTE — Progress Notes (Signed)
DAR NOTE: Patient presents with anxious affect and  mood.  Denies pain, auditory and visual hallucinations.  Rates depression at 0, hopelessness at 0, and anxiety at 0.  Maintained on routine safety checks.  Medications given as prescribed.  Support and encouragement offered as needed.  Attended group and participated. Patient observed socializing with peers in the dayroom.  Patient verbalizes readiness for discharge.  Offered no complaint.  Staff contacted group home representative scheduled to pick patient up.  Representative unable to pick patient up until 10 : 30 AM tomorrow due to his busy schedule.  Patient made aware of schedule change and tolerated the news well.

## 2016-04-26 NOTE — Progress Notes (Signed)
Adult Psychoeducational Group Note  Date:  04/26/2016 Time:  9:48 PM  Group Topic/Focus:  Wrap-Up Group:   The focus of this group is to help patients review their daily goal of treatment and discuss progress on daily workbooks.   Participation Level:  Minimal  Participation Quality:  Appropriate  Affect:  Flat  Cognitive:  Oriented  Insight: Limited  Engagement in Group:  Engaged  Modes of Intervention:  Socialization and Support  Additional Comments:  Patient attended and participated in group tonight. He reports that his day was alright. He waited on his ride all day. Although he was told that his ride will be here t tomorrow he believe his ride will still be coming tonight. Lita MainsFrancis, Lilianne Delair [facility] 04/26/2016, 9:48 PM

## 2016-04-26 NOTE — Progress Notes (Signed)
  [facility] :  Will you be returning to the same living situation after discharge:  No. At discharge, do you have transportation home?: Yes,  Ronald Willeen CassBennett Do you have the ability to pay for your medications: Yes,  MCD  Release of information consent forms completed and in the chart;  Patient's signature needed at discharge.  Patient to Follow up at: Follow-up Information    [facility] Follow up.   Why:  Someone will call you with a hospital follow up  appointment Contact information: [address] [place] [address] [phone]        [facility] Follow up.   Why:  Coordinate with them for start date Contact information: [address]  [place] [phone]          Next level of care provider has access to [facility] Link:no  Safety Planning and Suicide Prevention discussed: Yes,  yes  Have you used any form of tobacco in the last 30 days? (Cigarettes, Smokeless Tobacco, Cigars, and/or Pipes): Yes  Has patient been referred to the Quitline?: Patient refused referral  Patient has been referred for addiction treatment: N/A  Ronald Roman 04/26/2016, 3:53 PM

## 2016-04-26 NOTE — [facility] Suicide Risk Assessment (Addendum)
[facility]   Principal Problem: Paranoid schizophrenia [facility](HCC) Discharge Diagnoses:  Patient Active Problem List   Diagnosis Date Noted  . Acute psychosis [F23]   . Leukocytosis [D72.829] 02/06/2015  . Sleep apnea [G47.30] 02/05/2015  . AKI (acute kidney injury) (HCC) [N17.9] 02/04/2015  . Seizure disorder (HCC) [G40.909] 02/04/2015  . Acute encephalopathy [G93.40] 02/04/2015  . Diabetes mellitus without complication (HCC) [E11.9]   . Hyperlipidemia [E78.5]   . Hypertension [I10]   . Paranoid schizophrenia (HCC) [F20.0]     Total Time spent with patient: 30 minutes  Musculoskeletal: Strength & Muscle Tone: within normal limits Gait & Station: normal Patient leans: N/A  Psychiatric Specialty Exam: ROS denies headache, no chest pain, no shortness of breath, no nausea, no vomiting   Blood pressure (!) 145/82, pulse (!) 102, temperature 98.4 F (36.9 C), temperature source Oral, resp. rate 18, height 5' 5.5" (1.664 m), weight 202 lb (91.6 kg), SpO2 100 %.Body mass index is 33.1 kg/m.  General Appearance: Fairly Groomed  Patent attorneyye Contact::  Fair  Speech:  Normal Rate409  Volume:  Decreased  Mood:  states mood is " good " , denies feeling depressed   Affect:  blunted , not irritable   Thought Process:  Linear/concrete   Orientation:  Other:  fully alert and attentive   Thought Content:  denies any hallucinations, no delusions, not internally preoccupied   Suicidal Thoughts:  No denies any suicidal or self injurious ideations, denies any homicidal or violent ideations  Homicidal Thoughts:  No  Memory:  recent and remote grossly intact  Judgement:  Other:  improved  Insight:  Fair  Psychomotor Activity:  Normal  Concentration:  Good  Recall:  Good  Fund of Knowledge:Good  Language: Good  Akathisia:  Negative  Handed:  Right  AIMS (if indicated):     Assets:  Desire for Improvement Resilience  Sleep:  Number of Hours: 5.75  Cognition: WNL  ADL's:   Improved     Mental Status Per Nursing Assessment::   On Admission:  NA  Demographic Factors:  55 year old male, single, has two children   Loss Factors: Chronic mental illness   Historical Factors: History of Paranoid Schizophrenia  Risk Reduction Factors:   Positive coping skills or problem solving skills  Continued Clinical Symptoms:  At this time patient is alert , attentive, fairly groomed, vaguely guarded, but calm, cooperative, denies feeling depressed, affect is blunted, thought process linear, although noted to be slowed and concrete, denies hallucinations, does not appear internally preoccupied , no delusions expressed, no suicidal or self injurious ideations, no homicidal ideations. Denies medication side effects. ( Clozaril and Depakote ER)  12/3 WBC 11.7, ANC 7.6  Cognitive Features That Contribute To Risk:  No gross cognitive deficits noted upon discharge. Is alert , attentive  Suicide Risk:  Mild:  Suicidal ideation of limited frequency, intensity, duration, and specificity.  There are no identifiable plans, no associated intent, mild dysphoria and related symptoms, good self-control (both objective and subjective assessment), few other risk factors, and identifiable protective factors, including available and accessible social support.  Follow-up Information    [facility] Follow up.   Why:  Someone will call you with a hospital follow up  appointment Contact information: [address] [place] [address] [phone]        [facility] Follow up.   Why:  Coordinate with them for start date Contact information: [address]  [place] [phone]          Plan Of Care/Follow-up recommendations:  Activity:  as tolerated  Diet:  diabetic, heart healthy diet  Tests:  NA Other:  see below  Follow up as above  Of note, [facility]. Nehemiah MassedOBOS, Christon Gallaway, MD 04/26/2016,  1:13 PM

## 2016-04-26 NOTE — Discharge Summary (Signed)
Physician Discharge Summary Note  Patient:  Ronald Roman is an 55 y.o., male MRN:  [id number] DOB:  04-02-1961 Patient phone:  [phone] (home)  Patient address:   [name] [address],  Total Time spent with patient: 30 minutes  Date of Admission:  03/19/2016 Date of Discharge: 04/27/2016  Reason for Admission:  Per tele-assessment note:malewho presents to the ED after his neighbors called the police. According to the chart, the pt was in the road naked today and was talking to himself when [employer] arrived to the home. The pt may be non-compliant with his medications according to reports. While speaking with the pt, he reports that he does not know why he came to the hospital and continued speaking in a tangent. Pt stated :I am living with April, she is the love of my life and we are getting married twice this month and you are next." Pt denied S/I and when asked if he has ever experienced H/I, pt stated "let me think about that." Pt then paused for a brief moment and stated "no." Pt appeared disoriented during the assessment as he was distracted by laughing at the television and often stopped mid-sentence and starred blankly at this assessor. Pt also did not know when his birthday was and when asked he stated, "I don't know it was back in the old day, maybe April."Pt denies having a current therapist and stated "they all disgust me." Pt refused to participate in the assessment and began to ignore the questions being asked in regard to the pt's history and recent stressors. On Evaluation: Ronald Roman is awake, alert and oriented *3. Seen resting in dayroom interacting with peers.  Denies suicidal or homicidal ideation. Denies auditory or visual hallucination and does not appear to be responding to internal stimuli. Patient affect is flat and guarded. Patient reports he just need to be stable with his medications which is why he is at the hospital. Patient denies depression  or depressive symptoms . Support, encouragement and reassurance was provided.  Principal Problem: Paranoid schizophrenia [facility]) Discharge Diagnoses: Patient Active Problem List   Diagnosis Date Noted  . Acute psychosis [F23]   . Leukocytosis [D72.829] 02/06/2015  . Sleep apnea [G47.30] 02/05/2015  . AKI (acute kidney injury) (HCC) [N17.9] 02/04/2015  . Seizure disorder (HCC) [G40.909] 02/04/2015  . Acute encephalopathy [G93.40] 02/04/2015  . Diabetes mellitus without complication (HCC) [E11.9]   . Hyperlipidemia [E78.5]   . Hypertension [I10]   . Paranoid schizophrenia (HCC) [F20.0]     Past Psychiatric History:   Past Medical History:  Past Medical History:  Diagnosis Date  . Diabetes mellitus without complication (HCC)   . GERD (gastroesophageal reflux disease)   . Hyperlipidemia   . Hypertension   . Paranoid schizophrenia (HCC)   . Sleep apnea   . Uric acid nephrolithiasis    History reviewed. No pertinent surgical history. Family History: History reviewed. No pertinent family history. Family Psychiatric  History:  Social History:  History  Alcohol Use No     History  Drug Use No    Social History   Social History  . Marital status: Single    Spouse name: N/A  . Number of children: N/A  . Years of education: N/A   Social History Main Topics  . Smoking status: Current Every Day Smoker    Packs/day: 1.00    Types: Cigarettes  . Smokeless tobacco: Never Used  . Alcohol use No  . Drug use:  No  . Sexual activity: Not Currently   Other Topics Concern  . None   Social History Narrative  . None    Hospital Course:  Ronald Roman was admitted for Paranoid schizophrenia [facility](HCC) and crisis management.  Pt was treated discharged with the medications listed below under Medication List.  Medical problems were identified and treated as needed.  Home medications were restarted as appropriate.  Improvement was monitored by observation and Ronald Roman 's daily  report of symptom reduction.  Emotional and mental status was monitored by daily self-inventory reports completed by Ronald Roman and clinical staff.         Ronald Roman was evaluated by the treatment team for stability and plans for continued recovery upon discharge. Ronald Roman 's motivation was an integral factor for scheduling further treatment. Employment, transportation, bed availability, health status, family support, and any pending legal issues were also considered during hospital stay. Pt was offered further treatment options upon discharge including but not limited to Residential, Intensive Outpatient, and Outpatient treatment.  Ronald Roman will follow up with the services as listed below under Follow Up Information.     Upon completion of this admission the patient was both mentally and medically stable for discharge denying suicidal/homicidal ideation, auditory/visual/tactile hallucinations, delusional thoughts and paranoia.    Ronald Roman responded well to treatment with symmetrel 100 mg, Clozaril 12.5 and depakote 1250 mg without adverse effects.  Pt demonstrated improvement without reported or observed adverse effects to the point of stability appropriate for outpatient management. Pertinent labs include: CBC, CMP, Lipids, Prolactin 36.6 (high), Hgb A1C 8.0 (high), for which outpatient follow-up is necessary for lab recheck as mentioned below. Reviewed CBC, CMP, BAL, and UDS; all unremarkable aside from noted exceptions.   Physical Findings: AIMS: Facial and Oral Movements Muscles of Facial Expression: None, normal Lips and Perioral Area: None, normal Jaw: None, normal Tongue: None, normal,Extremity Movements Upper (arms, wrists, hands, fingers): None, normal Lower (legs, knees, ankles, toes): None, normal, Trunk Movements Neck, shoulders, hips: None, normal, Overall Severity Severity of abnormal movements (highest score from questions above): None,  normal Incapacitation due to abnormal movements: None, normal Patient's awareness of abnormal movements (rate only patient's report): No Awareness, Dental Status Current problems with teeth and/or dentures?: No Does patient usually wear dentures?: No  CIWA:  CIWA-Ar Total: 1 COWS:  COWS Total Score: 3  Musculoskeletal: Strength & Muscle Tone: within normal limits Gait & Station: normal Patient leans: N/A  Psychiatric Specialty Exam: SEE SRA by MD Physical Exam  Nursing note and vitals reviewed. Constitutional: He is oriented to person, place, and time.  Neurological: He is oriented to person, place, and time.  Psychiatric: He has a normal mood and affect. His behavior is normal.    ROS  Blood pressure 124/72, pulse (!) 111, temperature 99 F (37.2 C), temperature source Oral, resp. rate 18, height 5' 5.5" (1.664 m), weight 91.6 kg (202 lb), SpO2 100 %.Body mass index is 33.1 kg/m.  Have you used any form of tobacco in the last 30 days? (Cigarettes, Smokeless Tobacco, Cigars, and/or Pipes): Yes  Has this patient used any form of tobacco in the last 30 days? (Cigarettes, Smokeless Tobacco, Cigars, and/or Pipes) Yes, No  Blood Alcohol level:  Lab Results  Component Value Date   [facility]   ETH <5 03/01/2016    Metabolic Disorder Labs:  Lab Results  Component Value Date   HGBA1C 8.0 (H)  03/23/2016   MPG 183 03/23/2016   MPG 183 03/20/2016   Lab Results  Component Value Date   PROLACTIN 31.5 (H) 03/23/2016   PROLACTIN 36.6 (H) 03/20/2016   Lab Results  Component Value Date   CHOL 145 03/23/2016   TRIG 144 03/23/2016   HDL 30 (L) 03/23/2016   CHOLHDL 4.8 03/23/2016   VLDL 29 03/23/2016   LDLCALC 86 03/23/2016   LDLCALC 21 03/20/2016    See Psychiatric Specialty Exam and Suicide Risk Assessment completed by Attending Physician prior to discharge.  Discharge destination:  Home  Is patient on multiple antipsychotic therapies at discharge:  Yes,   Do you  recommend tapering to monotherapy for antipsychotics?  Per outpatient management     Has Patient had three or more failed trials of antipsychotic monotherapy by history:  Yes,   Antipsychotic medications that previously failed include:   1.  Zyprexa., 2.  Hinda Glatter. and 3.  resperidal.  Recommended Plan for Multiple Antipsychotic Therapies: NA  Discharge Instructions    Diet - low sodium heart healthy    Complete by:  As directed    Discharge instructions    Complete by:  As directed    Take all medications as prescribed. Keep all follow-up appointments as scheduled.  Do not consume alcohol or use illegal drugs while on prescription medications. Report any adverse effects from your medications to your primary care provider promptly.  In the event of recurrent symptoms or worsening symptoms, call 911, a crisis hotline, or go to the nearest emergency department for evaluation.   Increase activity slowly    Complete by:  As directed        Medication List    STOP taking these medications   cetirizine 10 MG chewable tablet Commonly known as:  ZYRTEC   divalproex 250 MG DR tablet Commonly known as:  DEPAKOTE Replaced by:  divalproex 250 MG 24 hr tablet   divalproex 500 MG DR tablet Commonly known as:  DEPAKOTE   LORazepam 0.5 MG tablet Commonly known as:  ATIVAN   metoprolol tartrate 25 MG tablet Commonly known as:  LOPRESSOR   QUEtiapine 100 MG tablet Commonly known as:  SEROQUEL   QUEtiapine 50 MG tablet Commonly known as:  SEROQUEL   zolpidem 5 MG tablet Commonly known as:  AMBIEN     TAKE these medications     Indication  albuterol 108 (90 Base) MCG/ACT inhaler Commonly known as:  PROVENTIL HFA;VENTOLIN HFA Inhale 2 puffs into the lungs every 4 (four) hours as needed for wheezing or shortness of breath.  Indication:  Asthma   amantadine 100 MG capsule Commonly known as:  SYMMETREL Take 1 capsule (100 mg total) by mouth 2 (two) times daily.  Indication:   Extrapyramidal Reaction caused by Medications   cloZAPine 25 MG tablet Commonly known as:  CLOZARIL Take 5 tablets (125 mg total) by mouth at bedtime. What changed:  medication strength  how much to take  when to take this  Indication:  Schizophrenia   cloZAPine 25 MG tablet Commonly known as:  CLOZARIL Take 0.5 tablets (12.5 mg total) by mouth daily. What changed:  You were already taking a medication with the same name, and this prescription was added. Make sure you understand how and when to take each.  Indication:  Schizophrenia   divalproex 250 MG 24 hr tablet Commonly known as:  DEPAKOTE ER Take 5 tablets (1,250 mg total) by mouth at bedtime. Replaces:  divalproex 250 MG DR  tablet  Indication:  Manic Phase of Manic-Depression   insulin aspart 100 UNIT/ML injection Commonly known as:  novoLOG Inject 4 Units into the skin 3 (three) times daily with meals.  Indication:  Type 2 Diabetes   insulin glargine 100 UNIT/ML injection Commonly known as:  LANTUS Inject 0.2 mLs (20 Units total) into the skin at bedtime. What changed:  how much to take  Indication:  Type 2 Diabetes   lisinopril 20 MG tablet Commonly known as:  PRINIVIL,ZESTRIL Take 1 tablet (20 mg total) by mouth daily. What changed:  medication strength  how much to take  Indication:  High Blood Pressure Disorder   temazepam 15 MG capsule Commonly known as:  RESTORIL Take 1 capsule (15 mg total) by mouth at bedtime.  Indication:  Trouble Sleeping      Follow-up Information    [facility] Follow up.   Why:  Someone will call you with a hospital follow up  appointment Contact information: [address] [place] [address] [phone]        [facility] Follow up.   Why:  Coordinate with them for start date Contact information: [address]  [place] [phone]          Follow-up recommendations:  Activity:  as tolerated Diet:  heart  healthy  Comments:  Take all medications as prescribed. Keep all follow-up appointments as scheduled.  Do not consume alcohol or use illegal drugs while on prescription medications. Report any adverse effects from your medications to your primary care provider promptly.  In the event of recurrent symptoms or worsening symptoms, call 911, a crisis hotline, or go to the nearest emergency department for evaluation.   Signed: Velna HatchetSheila May Tyner Codner NP [facility] 04/27/2016 1:20 PM

## 2016-04-26 NOTE — Progress Notes (Signed)
   D: When asked about his day pt stated, "fine, alright".  Writer informed pt that he is supposed to discharge today. Writer reminded pt that he's scheduled to be discharged tomorrow. Pt repeated that "he is leaving tonight". However, pt requested his hs meds and lay down without incident at the scheduled time. Pt has no other questions or concerns.    A:  Support and encouragement was offered. 15 min checks continued for safety.  R: Pt remains safe.

## 2016-04-26 NOTE — Progress Notes (Signed)
Recreation Therapy Notes  Date: 04/26/16 Time: 1000 Location: 500 [address] Dayroom  Group Topic: Coping Skills  Goal Area(s) Addresses:  Patients will be able to identify positive coping skills. Patients will be able to identify the benefits of coping skills. Patients will be able to identify how using positive coping skill can help them post d/c.  Intervention: Financial traderWeb worksheet, pencils  Activity: OrthoptistWeb Design.  Patients were to identify the situations that have brought them to the hospital and place them inside the spider web.  Patients were to then identify coping skills that can help them deal with those situations.  Education: PharmacologistCoping Skills, Building control surveyorDischarge Planning.   Education Outcome: Acknowledges understanding/In group clarification offered/Needs additional education.   Clinical Observations/Feedback:  Pt did not attend group.   Caroll RancherMarjette Nathian Stencil, LRT/CTRS         Caroll RancherLindsay, Amon Costilla A 04/26/2016 12:35 PM

## 2016-04-26 NOTE — [facility] Group Notes (Signed)
[facility] LCSW Group Therapy  04/26/2016 1:15 pm  Type of Therapy: Process Group Therapy  Participation Level:  Active  Participation Quality:  Appropriate  Affect:  Flat  Cognitive:  Oriented  Insight:  Improving  Engagement in Group:  Limited  Engagement in Therapy:  Limited  Modes of Intervention:  Activity, Clarification, Education, Problem-solving and Support  Summary of Progress/Problems: Today's group addressed the issue of overcoming obstacles.  Patients were asked to identify their biggest obstacle post d/c that stands in the way of their on-going success, and then problem solve as to how to manage this. In and out of group several times.  At one point, talked about his offspring.  "I have 2 sons at home.  I give them everything they need.  The other 2 I gave away to someone else to raise."  Ronald Geraldorth, [name] B 04/26/2016   2:55 PM

## 2016-04-26 NOTE — Tx Team (Signed)
Interdisciplinary Treatment and Diagnostic Plan Update  04/26/2016 Time of Session: 11:05 AM  Ronald Roman MRN: [id number]  Principal Diagnosis: Paranoid schizophrenia [facility])  Secondary Diagnoses: Principal Problem:   Paranoid schizophrenia ([place])   Current Medications:  Current Facility-Administered Medications  Medication Dose Route Frequency Provider Last Rate Last Dose  . acetaminophen (TYLENOL) tablet 650 mg  650 mg Oral Q6H PRN Lurena Nida, NP   650 mg at 03/21/16 0329  . amantadine (SYMMETREL) capsule 100 mg  100 mg Oral BID Ethelene Hal, NP   100 mg at 04/26/16 8115  . cloZAPine (CLOZARIL) tablet 12.5 mg  12.5 mg Oral Daily Saramma Eappen, MD   12.5 mg at 04/26/16 0815  . cloZAPine (CLOZARIL) tablet 125 mg  125 mg Oral QHS Ursula Alert, MD   125 mg at 04/25/16 2040  . divalproex (DEPAKOTE ER) 24 hr tablet 1,250 mg  1,250 mg Oral QHS Ursula Alert, MD   1,250 mg at 04/25/16 2040  . hydrocerin (EUCERIN) cream   Topical TID Ursula Alert, MD      . insulin aspart (novoLOG) injection 0-15 Units  0-15 Units Subcutaneous TID WC Ursula Alert, MD   3 Units at 04/26/16 0617  . insulin aspart (novoLOG) injection 0-5 Units  0-5 Units Subcutaneous QHS Ursula Alert, MD   2 Units at 04/25/16 2046  . insulin aspart (novoLOG) injection 4 Units  4 Units Subcutaneous TID WC Ursula Alert, MD   4 Units at 04/26/16 0615  . insulin glargine (LANTUS) injection 20 Units  20 Units Subcutaneous QHS Ursula Alert, MD   20 Units at 04/25/16 2045  . lisinopril (PRINIVIL,ZESTRIL) tablet 20 mg  20 mg Oral Daily Ursula Alert, MD   20 mg at 04/26/16 0814  . nicotine polacrilex (NICORETTE) gum 2 mg  2 mg Oral PRN Ursula Alert, MD   2 mg at 04/20/16 0835  . OLANZapine (ZYPREXA) tablet 10 mg  10 mg Oral BID PRN Ursula Alert, MD   10 mg at 04/08/16 0839   Or  . OLANZapine (ZYPREXA) injection 10 mg  10 mg Intramuscular BID PRN Ursula Alert, MD      . temazepam (RESTORIL) capsule 15 mg   15 mg Oral QHS Ursula Alert, MD   15 mg at 04/25/16 2039    PTA Medications: Prescriptions Prior to Admission  Medication Sig Dispense Refill Last Dose  . albuterol (PROVENTIL HFA;VENTOLIN HFA) 108 (90 BASE) MCG/ACT inhaler Inhale 2 puffs into the lungs every 4 (four) hours as needed for wheezing or shortness of breath. (Patient not taking: Reported on 03/19/2016) 1 Inhaler 0 Not Taking at Unknown time  . cetirizine (ZYRTEC) 10 MG chewable tablet Chew 1 tablet (10 mg total) by mouth daily. (Patient not taking: Reported on 03/19/2016) 20 tablet 1 Not Taking at Unknown time  . cloZAPine (CLOZARIL) 100 MG tablet Take 1 tablet (100 mg total) by mouth 2 (two) times daily. (Patient not taking: Reported on 03/19/2016) 60 tablet 0 Not Taking at Unknown time  . divalproex (DEPAKOTE) 250 MG DR tablet Take 1 tablet (250 mg total) by mouth at bedtime. (Patient not taking: Reported on 03/19/2016) 30 tablet 0 Not Taking at Unknown time  . divalproex (DEPAKOTE) 500 MG DR tablet Take 2 tablets (1,000 mg total) by mouth at bedtime. (Patient not taking: Reported on 03/19/2016) 60 tablet 0 Not Taking at Unknown time  . insulin glargine (LANTUS) 100 UNIT/ML injection Inject 10 Units into the skin at bedtime.   Not Taking  at Unknown time  . lisinopril (PRINIVIL,ZESTRIL) 10 MG tablet Take 10 mg by mouth daily.    Not Taking at Unknown time  . LORazepam (ATIVAN) 0.5 MG tablet Take 1 tablet (0.5 mg total) by mouth 2 (two) times daily. (Patient not taking: Reported on 03/19/2016) 60 tablet 0 Not Taking at Unknown time  . metoprolol tartrate (LOPRESSOR) 25 MG tablet Take 25 mg by mouth 2 (two) times daily.   Not Taking at Unknown time  . QUEtiapine (SEROQUEL) 100 MG tablet Take 1 tablet (100 mg total) by mouth at bedtime. (Patient not taking: Reported on 03/19/2016) 30 tablet 0 Not Taking at Unknown time  . QUEtiapine (SEROQUEL) 50 MG tablet Take 1 tablet (50 mg total) by mouth daily. (Patient not taking: Reported on  03/19/2016) 30 tablet 0 Not Taking at Unknown time  . zolpidem (AMBIEN) 5 MG tablet Take 1 tablet (5 mg total) by mouth at bedtime as needed for sleep. (Patient not taking: Reported on 03/19/2016) 30 tablet 0 Not Taking at Unknown time    Treatment Modalities: Medication Management, Group therapy, Case management,  1 to 1 session with clinician, Psychoeducation, Recreational therapy.   Physician Treatment Plan for Primary Diagnosis: Paranoid schizophrenia (HCC) Long Term Goal(s): Improvement in symptoms so as ready for discharge  Short Term Goals: Ability to identify changes in lifestyle to reduce recurrence of condition will improve  Medication Management: Evaluate patient's response, side effects, and tolerance of medication regimen.  Therapeutic Interventions: 1 to 1 sessions, Unit Group sessions and Medication administration.  Evaluation of Outcomes: Adequate for Discharge  Physician Treatment Plan for Secondary Diagnosis: Principal Problem:   Paranoid schizophrenia (HCC)   Long Term Goal(s): Improvement in symptoms so as ready for discharge  Short Term Goals: Ability to identify and develop effective coping behaviors will improve  Medication Management: Evaluate patient's response, side effects, and tolerance of medication regimen.  Therapeutic Interventions: 1 to 1 sessions, Unit Group sessions and Medication administration.  Evaluation of Outcomes: Adequate for Discharge  11/2: Patient today seen as disorganized, delusional - will change his clozaril to Invega - given his inability to be compliant with labs as well as taking his medications , and given the fact that he is going to return to his independent living situation with case management available . Will discontinue  Clozaril for inability to be compliant with labs. Will start a trial of Invega 3 mg po qhs for psychosis. Will offer Invega sustenna IM prior to discharge. Pt agrees with plan. Will continue Depakote ,  change to ER 1000 mg po qhs for mood sx. Depakote level  - 03/25/16. Will discontinue Trazodone for lack of efficacy. Start Doxepin 10 mg po qhs for sleep.  11/7:  Will continue Invega  12 mg po qhs for psychosis. Will offer Invega sustenna IM prior to discharge. Pt agrees with plan. Increased  Depakote  ER to 1250 mg po qhs for mood lability , although Depakote level - 03/25/16- 72 ( therapeutic) ,  depakote level on 03/30/16- I have reviewed depakote level today - 74- therapeutic. Will increase  Doxepin to 50 mg po qhs for sleep. Will continue Ativan  1 mg po qhs for anxiety   11/13: Patient today seen as minimizing his sx, continues to be grandiose , delusional , has sleep issues . Started on a second antipsychotic yesterday to augment the effect of Invega . Will reduce Invega to 9 mg po qhs for psychosis. Will continue Geodon 40 mg po bid   with meals for augmenting the effect of Invega . Increased  Depakote  ER to 1250 mg po qhs for mood lability , although Depakote level - 03/25/16- 72 ( therapeutic) ,  depakote level on 03/30/16- I have reviewed depakote level today - 74- therapeutic. Will discontinue  Doxepin for lack of efficacy. Start Elavil 25 mg po qhs for sleep. Will continue Ativan  1 mg po qhs for anxiety   11/16:Patient continues to be disorganized ,continues to have delusional thoughts , irrelevant on and off and continues to have  inappropriate laughter.  Will increase  Clozaril to 12.5 mg po daily and clozaril 50 mg po qhs for psychosis. Reviewed CBC with diff - 04/06/16- ANC- 8.6 /WBC- 12.8  wnl.needs weekly CBC/ANC. Will taper off  Geodon for lack of efficacy. Will continue  Depakote  ER to 1250 mg po qhs for mood lability , although Depakote level - 03/25/16- 72 ( therapeutic) ,  depakote level on 03/30/16- I have reviewed depakote level  - 74- therapeutic. Will continue Restoril 15 mg po qhs for sleep.  11/21: Today presents as irrelevant, delusional, refused AM meds  Will  continue  Clozaril  12.5 mg po daily and increase clozaril to 100 mg po qhs for psychosis. Reviewed CBC with diff - 04/06/16- ANC- 8.6 /WBC- 12.8  wnl.needs weekly CBC/ANC- 04/13/16- pt refused labs - will reorder. Reduced Depakote  ER to 1000 mg po qhs for mood lability , although Depakote level - 03/25/16- 72 ( therapeutic) ,  depakote level on 03/30/16- I have reviewed depakote level  - 74- therapeutic.Dose reduced to address his tiredness. Will get another depakote level on 04/16/16.  11/24: Presentation remains much the same.  Will continue  Clozaril  12.5 mg po daily and increase clozaril to 125 mg po qhs for psychosis.   Reviewed CBC with diff - 04/06/16- ANC- 8.6 /WBC- 12.8  wnl.needs weekly CBC/ANC- 04/13/16-ANC - 8/WBC-12.2 Reduced Depakote  ER to 1000 mg po qhs for mood lability , although Depakote level - 03/25/16- 72 ( therapeutic) ,  depakote level on 03/30/16- I have reviewed depakote level  - 74- therapeutic.Dose reduced to address his tiredness. Will get another depakote level on 04/16/16. Will continue Restoril 15 mg po qhs for sleep.   RN Treatment Plan for Primary Diagnosis: Paranoid schizophrenia (HCC) Long Term Goal(s): Knowledge of disease and therapeutic regimen to maintain health will improve  Short Term Goals: Compliance with prescribed medications will improve  Medication Management: RN will administer medications as ordered by provider, will assess and evaluate patient's response and provide education to patient for prescribed medication. RN will report any adverse and/or side effects to prescribing provider.  Therapeutic Interventions: 1 on 1 counseling sessions, Psychoeducation, Medication administration, Evaluate responses to treatment, Monitor vital signs and CBGs as ordered, Perform/monitor CIWA, COWS, AIMS and Fall Risk screenings as ordered, Perform wound care treatments as ordered.  Evaluation of Outcomes: Adequate for Discharge   LCSW Treatment Plan for  Primary Diagnosis: Paranoid schizophrenia (HCC) Long Term Goal(s): Safe transition to appropriate next level of care at discharge, Engage patient in therapeutic group addressing interpersonal concerns.  Short Term Goals: Engage patient in aftercare planning with referrals and resources  Therapeutic Interventions: Assess for all discharge needs, 1 to 1 time with Social worker, Explore available resources and support systems, Assess for adequacy in community support network, Educate family and significant other(s) on suicide prevention, Complete Psychosocial Assessment, Interpersonal group therapy.  Evaluation of Outcomes: Met  CSW left message   for Melody Jefferey Pica transition coordinator at [phone]. 11/2:  [facility] does not support referral to [facility].  However, they are requesting referral to [facility] as a woman with whom Keysean had a good relationship with in the past has transferred to that agency.  As noted above, we are trying to switch him to injectable due to recent history of non-compliance with meds, and the fact that he will not be monitored daily. 11/7:  [facility] came to see patient.  Unable to complete assessment due to delusions, disorganization 11/13: Pt visited by [facility] peer support last week-they report he is far from baseline previous to first of three hospitalizations that began in October, and that he appears incapable of living independently.  [facility] has agreed that if he does not progress under our care, they will support [facility] referral 11/16: Pt is on the [facility] wait list. CSW to begin PASSR process this week for placement despite the lack of progress addressing mental health symptoms 11/21: Pt was visited by Alphonzo Dublin [facility] manager.  Her feedback is that irrelevence and disorganization is not an issue, but inability to agree that he agrees to go to [facility] is an issue, and he insists that he will return to [employer] at d/c.  "I own the whole town. I can stay anywhere I  want." 11/29:  Pt was visited by Mr Richardson Landry today for possible placement.  Mr Virgia Land and Hendry agreed they would like to work together.  CSW to confirm that Mr Virgia Land will be payee with [facility], resubmit request for PASSR, Administer PPD.  Hopeful placement on Monday 12/4:  Accepted at [facility].  Follow up [place]  Progress in Treatment: Attending groups: No Participating in groups: No Taking medication as prescribed: Yes Toleration medication: Yes, no side effects reported at this time Family/Significant other contact made: Yes  See above Patient understands diagnosis: No  Limited insight Discussing patient identified problems/goals with staff: Yes Medical problems stabilized or resolved: Yes Denies suicidal/homicidal ideation: Yes Issues/concerns per patient self-inventory: None Other: N/A  New problem(s) identified: None identified at this time.   New Short Term/Long Term Goal(s): None identified at this time.   Discharge Plan or Barriers: See above  Reason for Continuation of Hospitalization:     Estimated Length of Stay: D/C today  Attendees: Patient: 04/26/2016  11:05 AM  Physician: Ursula Alert, MD 04/26/2016  11:05 AM  Nursing: Grayland Ormond, RN 04/26/2016  11:05 AM  RN Care Manager: Lars Pinks, RN 04/26/2016  11:05 AM  Social Worker: Ripley Fraise 04/26/2016  11:05 AM  Recreational Therapist: Marjette  04/26/2016  11:05 AM  Other: Norberto Sorenson 04/26/2016  11:05 AM  Other:  04/26/2016  11:05 AM    Scribe for Treatment Team:  Roque Lias LCSW 04/26/2016 11:05 AM

## 2016-04-27 LAB — GLUCOSE, CAPILLARY
Glucose-Capillary: 317 mg/dL — ABNORMAL HIGH (ref 65–99)
Glucose-Capillary: 227 mg/dL — ABNORMAL HIGH (ref 65–99)

## 2016-04-27 MED ORDER — CLOZAPINE 25 MG PO TABS: 125.0000 mg | ORAL_TABLET | Freq: Every day | ORAL | 0 refills | Status: DC

## 2016-04-27 MED ORDER — CLOZAPINE 25 MG PO TABS: 12.5000 mg | ORAL_TABLET | Freq: Every day | ORAL | 0 refills | Status: DC

## 2016-04-27 MED ORDER — CLOZAPINE 100 MG PO TABS: 100.0000 mg | ORAL_TABLET | Freq: Two times a day (BID) | ORAL | 0 refills | Status: DC

## 2016-04-27 NOTE — Progress Notes (Signed)
Recreation Therapy Notes  Date: 04/27/16 Time: 1000 Location: [address] Dayroom  Group Topic: Anger Management  Goal Area(s) Addresses:  Patient will identify triggers for anger.  Patient will identify physical reaction to anger.   Patient will identify benefit of using coping skills when angry.  Behavioral Response: Observed  Intervention: Anger thermometer worksheet  Activity: Anger Thermometer.  Patients were given a worksheet with a thermometer to rank their experiences with anger "1" being the least and "10" being the most angry.  Patients were to give a description of the incident, how they reacted, how they felt and the consequences of their actions.    Education: Anger Management, Discharge Planning   Education Outcome: Acknowledges education/In group clarification offered/Needs additional education.   Clinical Observations/Feedback: Pt arrived late stayed for about five minutes then left and did not return.   Caroll RancherMarjette Eleora Sutherland, LRT/CTRS          Caroll RancherLindsay, Demontay Grantham A 04/27/2016 11:55 AM

## 2016-04-27 NOTE — Plan of Care (Signed)
Problem: [facility] Participation in Recreation Therapeutic Interventions Goal: STG-Patient will attend/participate in Rec Therapy Group Ses STG-The Patient will attend and participate in Recreation Therapy Group Sessions  Outcome: Completed/Met Date Met: 04/27/16 Pt attended and participated in various recreation therapy sessions such as coping skills, anger management, stress management and wellness.  Victorino Sparrow, LRT/CTRS

## 2016-04-27 NOTE — Progress Notes (Signed)
Recreation Therapy Notes  Animal-Assisted Activity (AAA) Program Checklist/Progress Notes Patient Eligibility Criteria Checklist & Daily Group note for Rec Tx Intervention  Date: 12.05.2017 Time: 2:45pm [address]: [address]    AAA/T Program Assumption of Risk Form signed by Patient/ or Parent Legal Guardian Yes  Patient is free of allergies or sever asthma Yes  Patient reports no fear of animals Yes  Patient reports no history of cruelty to animals Yes  Patient understands his/her participation is voluntary Yes  Behavioral Response: Did not attend.   Education: Charity fundraiserHand Washing, Health visitorAppropriate Animal Interaction   Education Outcome: Acknowledges education.   Clinical Observations/Feedback: Patient discussed with MD for appropriateness in pet therapy session. Both LRT and MD agree patient is appropriate for participation. Patient offered participation in session, however politely declined.   Marykay Lexenise L Rayvin Abid, LRT/CTRS  Alexee Delsanto L 04/27/2016 3:09 PM

## 2016-04-27 NOTE — [facility] Suicide Risk Assessment (Signed)
[facility]   Principal Problem: Paranoid schizophrenia [facility](HCC) Discharge Diagnoses:  Patient Active Problem List   Diagnosis Date Noted  . Acute psychosis [F23]   . Leukocytosis [D72.829] 02/06/2015  . Sleep apnea [G47.30] 02/05/2015  . AKI (acute kidney injury) (HCC) [N17.9] 02/04/2015  . Seizure disorder (HCC) [G40.909] 02/04/2015  . Acute encephalopathy [G93.40] 02/04/2015  . Diabetes mellitus without complication (HCC) [E11.9]   . Hyperlipidemia [E78.5]   . Hypertension [I10]   . Paranoid schizophrenia (HCC) [F20.0]     Total Time spent with patient: 30 minutes  Musculoskeletal: Strength & Muscle Tone: within normal limits Gait & Station: normal Patient leans: N/A  Psychiatric Specialty Exam: Review of Systems  Psychiatric/Behavioral: Negative for depression. The patient is not nervous/anxious and does not have insomnia.   All other systems reviewed and are negative.   Blood pressure 124/72, pulse (!) 111, temperature 99 F (37.2 C), temperature source Oral, resp. rate 18, height 5' 5.5" (1.664 m), weight 91.6 kg (202 lb), SpO2 100 %.Body mass index is 33.1 kg/m.  General Appearance: Casual  Eye Contact::  Fair  Speech:  Clear and Coherent409  Volume:  Normal  Mood:  Euthymic  Affect:  Appropriate  Thought Process:  Goal Directed and Descriptions of Associations: Intact  Orientation:  Full (Time, Place, and Person)  Thought Content:  Logical  Suicidal Thoughts:  No  Homicidal Thoughts:  No  Memory:  Immediate;   Fair Recent;   Fair Remote;   Fair  Judgement:  Fair  Insight:  Good and Fair  Psychomotor Activity:  Normal  Concentration:  Fair  Recall:  FiservFair  Fund of Knowledge:Fair  Language: Fair  Akathisia:  No  Handed:  Right  AIMS (if indicated):   0  Assets:  Communication Skills Desire for Improvement  Sleep:  Number of Hours: 6.75  Cognition: WNL  ADL's:  Intact   Mental Status Per Nursing Assessment::   On Admission:   NA  Demographic Factors:  Male  Loss Factors: NA  Historical Factors: Impulsivity  Risk Reduction Factors:   Positive social support  Continued Clinical Symptoms:  Previous Psychiatric Diagnoses and Treatments  Cognitive Features That Contribute To Risk:  None    Suicide Risk:  Minimal: No identifiable suicidal ideation.  Patients presenting with no risk factors but with morbid ruminations; may be classified as minimal risk based on the severity of the depressive symptoms  Follow-up Information    [facility] Follow up.   Why:  Someone will call you with a hospital follow up  appointment Contact information: [address] [place] [address] [phone]        [facility] Follow up.   Why:  Coordinate with them for start date Contact information: [address]  [place] [phone]          Plan Of Care/Follow-up recommendations:  Activity:  no restrictions Diet:  carb modified Tests:  CBC with diff as per clozaril REMS protocol Other:  none  Georgetta Crafton, MD 04/27/2016, 9:14 AM

## 2016-04-27 NOTE — Progress Notes (Signed)
Pt discharged to a group home with staff of that group home. Pt was stable and appreciative at that time. All papers and prescriptions were given and valuables returned. Verbal understanding expressed. Denies SI/HI and A/VH. Pt given opportunity to express concerns and ask questions.

## 2016-04-29 LAB — QUANTIFERON IN TUBE
QFT TB AG MINUS NIL VALUE: 0.16 [IU]/mL
QUANTIFERON TB AG VALUE: 0.21 IU/mL
QUANTIFERON TB GOLD: NEGATIVE
Quantiferon Nil Value: 0.05 IU/mL

## 2016-04-29 LAB — QUANTIFERON TB GOLD ASSAY (BLOOD)

## 2016-05-18 IMAGING — DX DG CHEST 2V
3 series · 3 of 3 positions shown · non-contrast
Comparison: 02/04/2015

CLINICAL DATA: COUGH, FEVER AND SOB X 10 DAYS; HX OF ASTHMA

EXAM:
CHEST  2 VIEW

[chest pa]
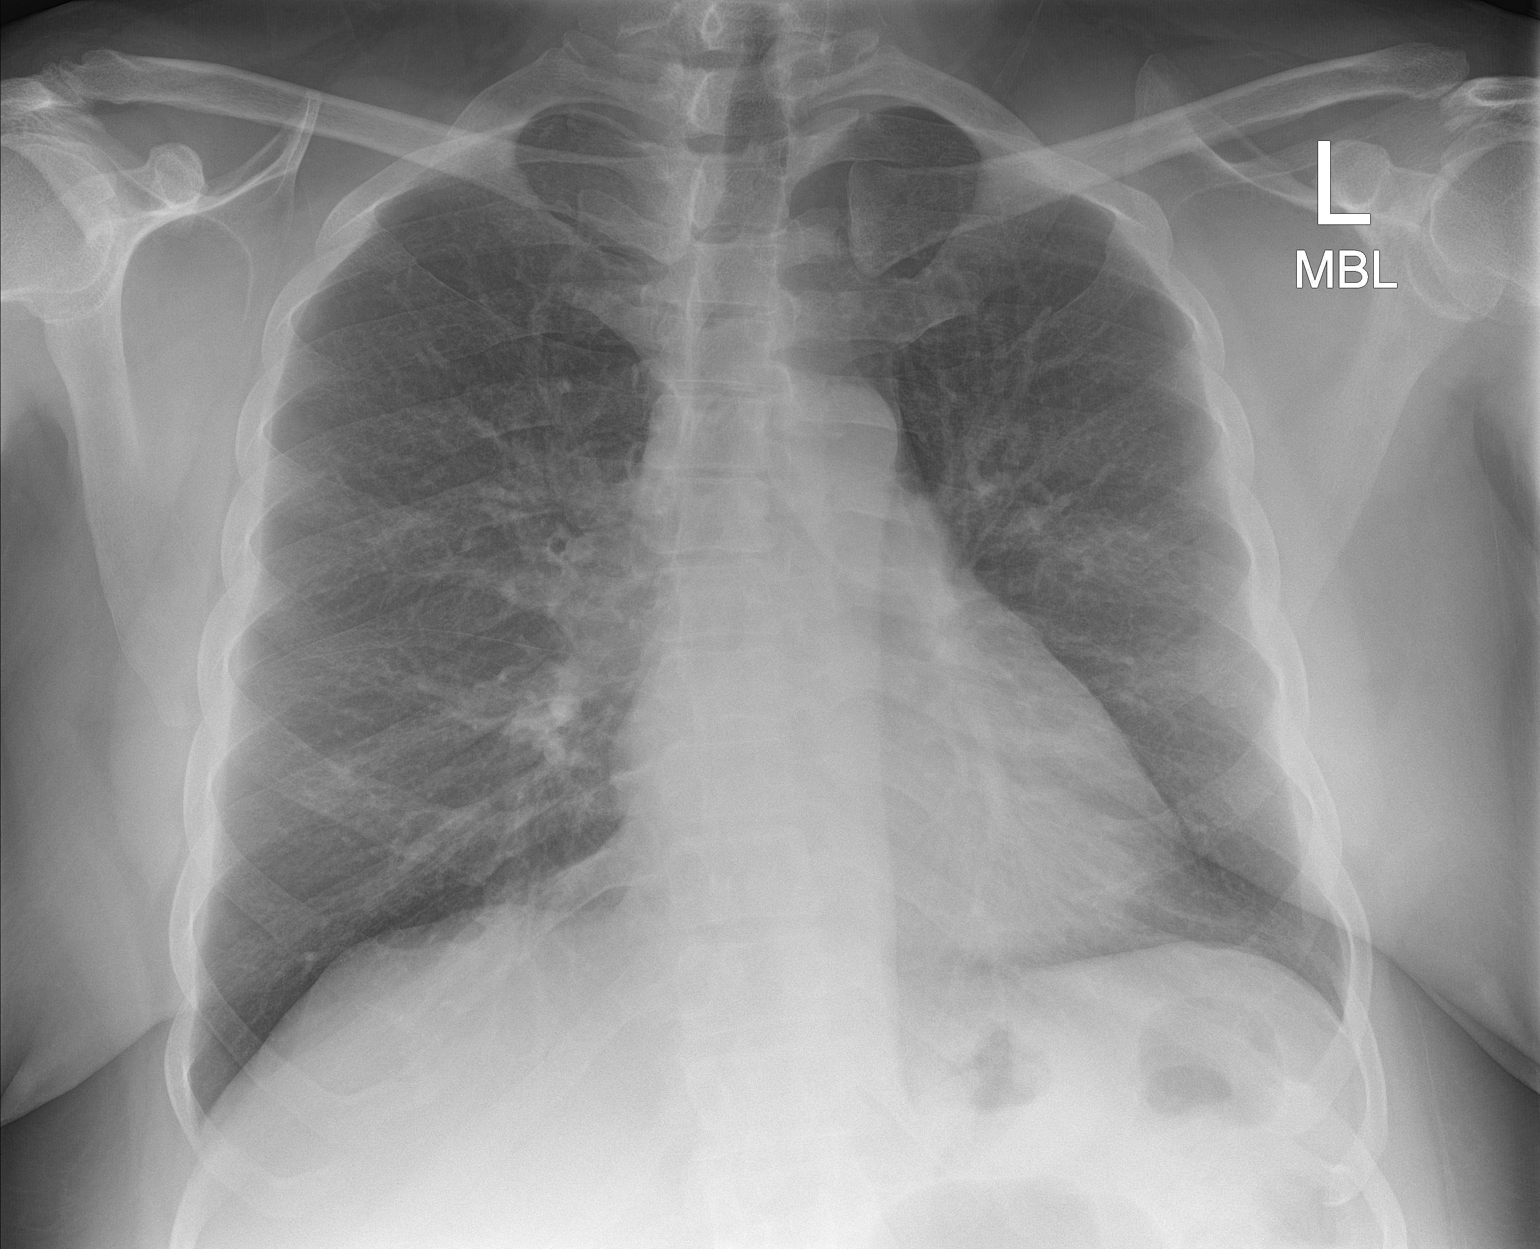

[chest lat]
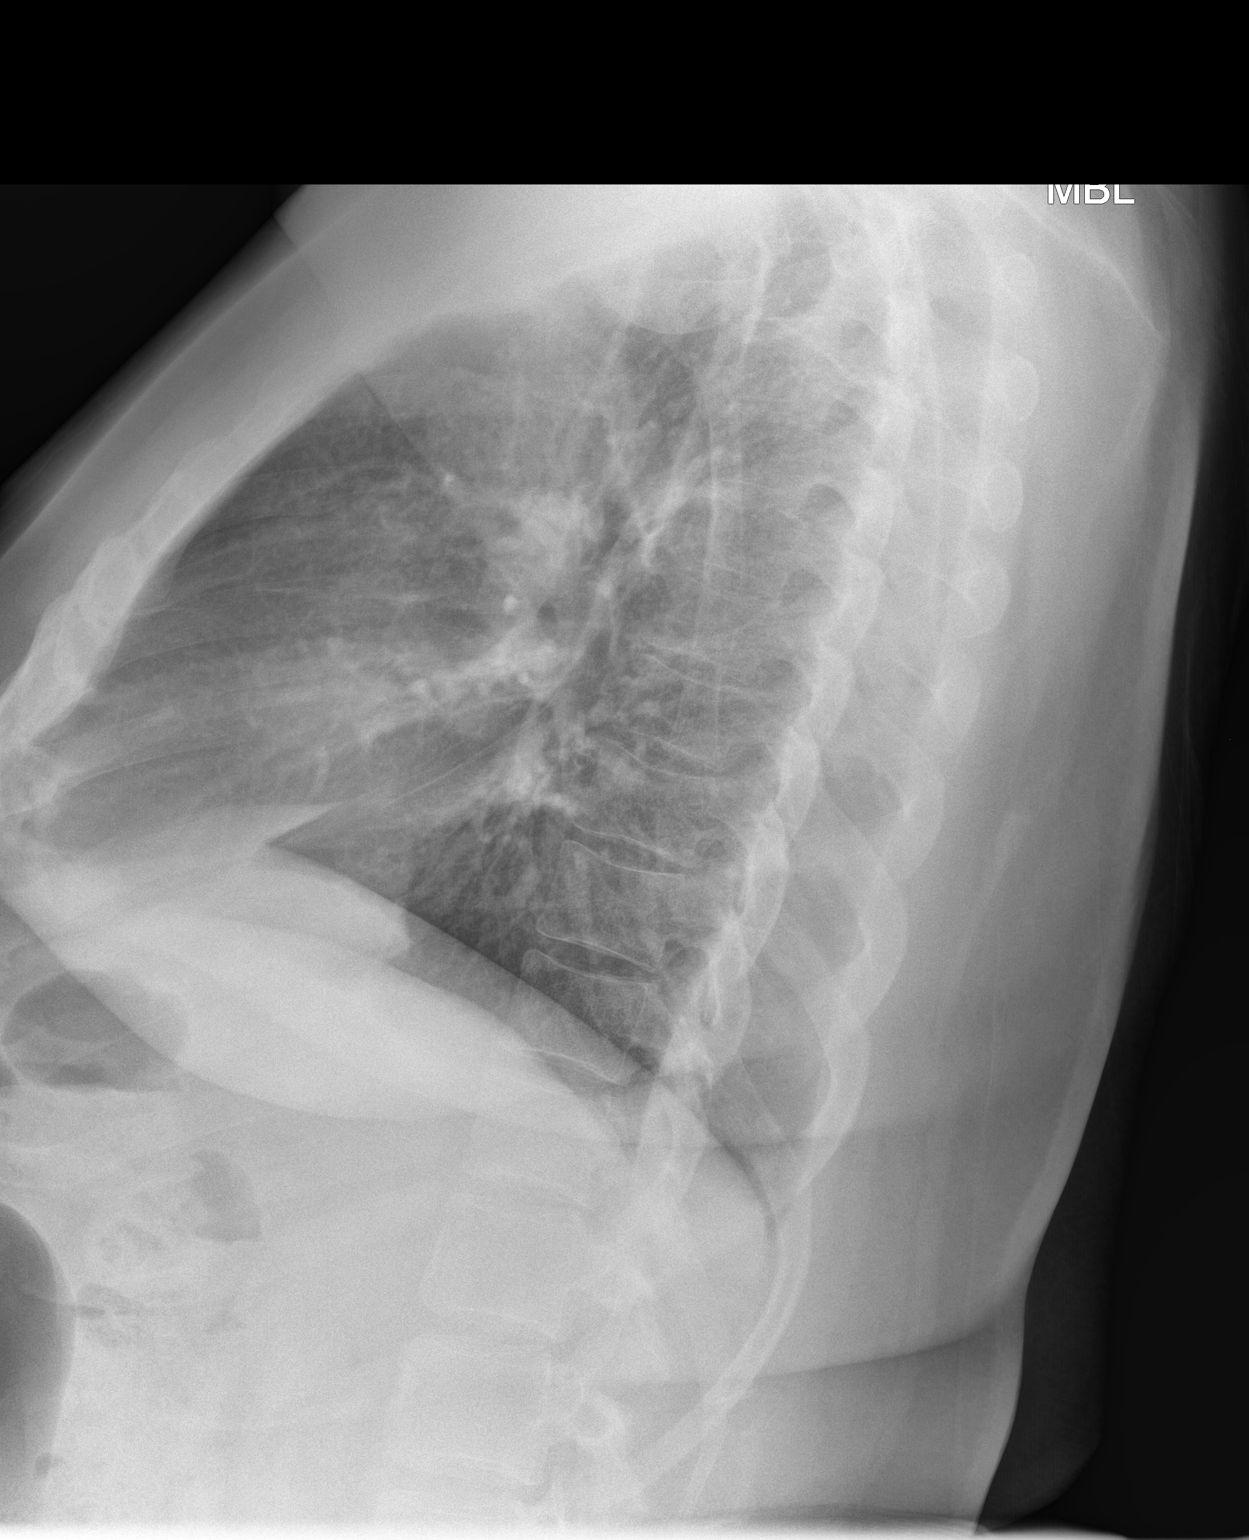

[chest ap]
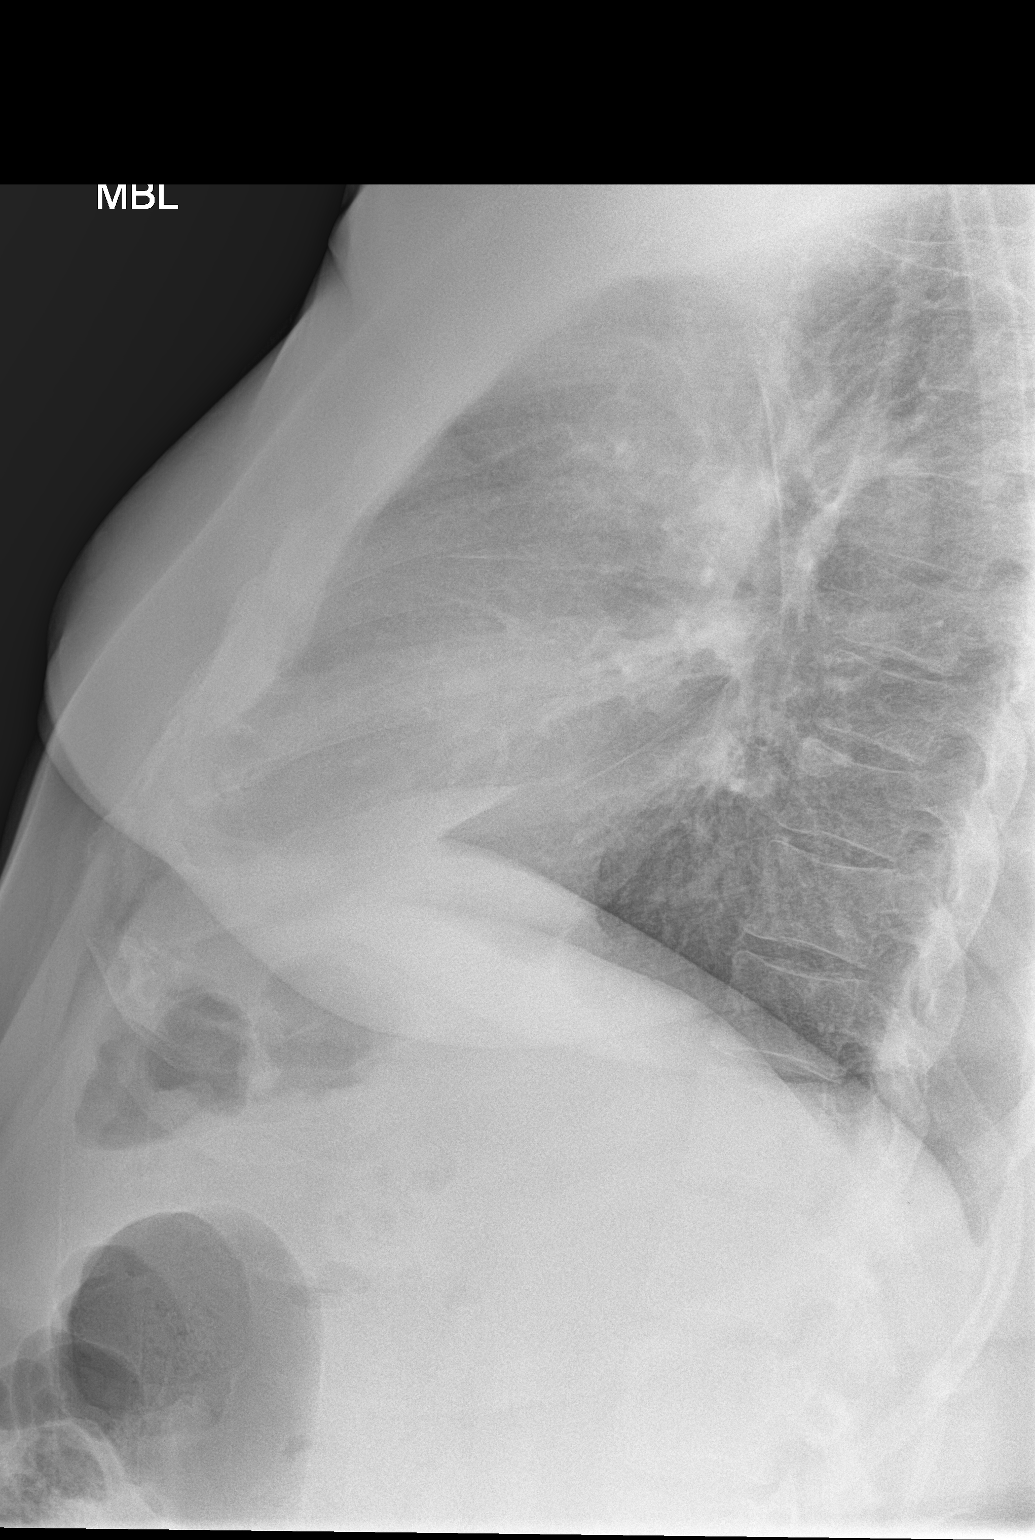

[3 of 3 positions shown; findings below may reference images not displayed]

FINDINGS: Lateral view degraded by patient arm position. Midline trachea.
Borderline cardiomegaly. Mediastinal contours otherwise within
normal limits. No pleural effusion or pneumothorax. Diffuse
peribronchial thickening. Clear lungs.
IMPRESSION: No acute cardiopulmonary disease.

Peribronchial thickening which may relate to chronic bronchitis,
asthma or smoking.

## 2016-05-30 ENCOUNTER — Encounter (HOSPITAL_COMMUNITY): Payer: Self-pay | Admitting: Emergency Medicine

## 2016-05-30 ENCOUNTER — Emergency Department (HOSPITAL_COMMUNITY): Payer: Medicaid Other

## 2016-05-30 ENCOUNTER — Emergency Department (HOSPITAL_COMMUNITY)
Admission: EM | Admit: 2016-05-30 | Discharge: 2016-05-30 | Disposition: A | Discharge status: Home / Self Care | Payer: Medicaid Other | Attending: Emergency Medicine | Admitting: Emergency Medicine

## 2016-05-30 DIAGNOSIS — Z79899 Other long term (current) drug therapy: Secondary | ICD-10-CM | POA: Diagnosis not present

## 2016-05-30 DIAGNOSIS — Z794 Long term (current) use of insulin: Secondary | ICD-10-CM | POA: Diagnosis not present

## 2016-05-30 DIAGNOSIS — I10 Essential (primary) hypertension: Secondary | ICD-10-CM | POA: Insufficient documentation

## 2016-05-30 DIAGNOSIS — R569 Unspecified convulsions: Secondary | ICD-10-CM

## 2016-05-30 DIAGNOSIS — F17210 Nicotine dependence, cigarettes, uncomplicated: Secondary | ICD-10-CM | POA: Insufficient documentation

## 2016-05-30 DIAGNOSIS — G40909 Epilepsy, unspecified, not intractable, without status epilepticus: Secondary | ICD-10-CM | POA: Insufficient documentation

## 2016-05-30 DIAGNOSIS — E119 Type 2 diabetes mellitus without complications: Secondary | ICD-10-CM | POA: Insufficient documentation

## 2016-05-30 LAB — COMPREHENSIVE METABOLIC PANEL
ALT: 12 U/L — AB (ref 17–63)
AST: 18 U/L (ref 15–41)
Albumin: 3.8 g/dL (ref 3.5–5.0)
Alkaline Phosphatase: 91 U/L (ref 38–126)
Anion gap: 7 (ref 5–15)
BUN: 20 mg/dL (ref 6–20)
CO2: 25 mmol/L (ref 22–32)
CREATININE: 1 mg/dL (ref 0.61–1.24)
Calcium: 9.4 mg/dL (ref 8.9–10.3)
Chloride: 105 mmol/L (ref 101–111)
GFR calc Af Amer: >60 mL/min (ref >60)
Glucose, Bld: 122 mg/dL — ABNORMAL HIGH (ref 65–99)
Potassium: 3.6 mmol/L (ref 3.5–5.1)
Sodium: 137 mmol/L (ref 135–145)
TOTAL PROTEIN: 7.5 g/dL (ref 6.5–8.1)
Total Bilirubin: 0.4 mg/dL (ref 0.3–1.2)

## 2016-05-30 LAB — CBC WITH DIFFERENTIAL/PLATELET
BASOS ABS: 0 10*3/uL (ref 0.0–0.1)
Basophils Relative: 0 %
EOS PCT: 1 %
Eosinophils Absolute: 0.1 10*3/uL (ref 0.0–0.7)
HCT: 40.6 % (ref 39.0–52.0)
HEMOGLOBIN: 13.5 g/dL (ref 13.0–17.0)
LYMPHS ABS: 2.4 10*3/uL (ref 0.7–4.0)
LYMPHS PCT: 23 %
MCH: 26.4 pg (ref 26.0–34.0)
MCHC: 33.3 g/dL (ref 30.0–36.0)
MCV: 79.5 fL (ref 78.0–100.0)
Monocytes Absolute: 1.1 10*3/uL — ABNORMAL HIGH (ref 0.1–1.0)
Monocytes Relative: 10 %
NEUTROS ABS: 6.7 10*3/uL (ref 1.7–7.7)
NEUTROS PCT: 66 %
PLATELETS: 328 10*3/uL (ref 150–400)
RBC: 5.11 MIL/uL (ref 4.22–5.81)
RDW: 14.9 % (ref 11.5–15.5)
WBC: 10.2 10*3/uL (ref 4.0–10.5)

## 2016-05-30 LAB — ETHANOL

## 2016-05-30 LAB — RAPID URINE DRUG SCREEN, HOSP PERFORMED
Amphetamines: NOT DETECTED
BENZODIAZEPINES: POSITIVE — AB
Barbiturates: NOT DETECTED
Cocaine: NOT DETECTED
Opiates: NOT DETECTED
TETRAHYDROCANNABINOL: NOT DETECTED

## 2016-05-30 LAB — I-STAT TROPONIN, ED: TROPONIN I, POC: 0 ng/mL (ref 0.00–0.08)

## 2016-05-30 MED ORDER — AMANTADINE HCL 100 MG PO CAPS
100.0000 mg | ORAL_CAPSULE | Freq: Two times a day (BID) | ORAL | Status: DC
  Administered 2016-05-30: 100 mg via ORAL
  Filled 2016-05-30: qty 1

## 2016-05-30 MED ORDER — DIVALPROEX SODIUM ER 500 MG PO TB24
1250.0000 mg | ORAL_TABLET | Freq: Every day | ORAL | Status: DC
  Administered 2016-05-30: 21:00:00 1250 mg via ORAL
  Filled 2016-05-30: qty 2

## 2016-05-30 MED ORDER — CLOZAPINE 25 MG PO TABS
25.0000 mg | ORAL_TABLET | Freq: Every day | ORAL | Status: DC
  Administered 2016-05-30: 25 mg via ORAL
  Filled 2016-05-30: qty 1

## 2016-05-30 MED ORDER — CLOZAPINE 100 MG PO TABS
100.0000 mg | ORAL_TABLET | Freq: Every day | ORAL | Status: DC
  Administered 2016-05-30: 100 mg via ORAL
  Filled 2016-05-30: qty 1

## 2016-05-30 MED ORDER — SODIUM CHLORIDE 0.9 % IV BOLUS (SEPSIS)
500.0000 mL | Freq: Once | INTRAVENOUS | Status: AC
  Administered 2016-05-30: 500 mL via INTRAVENOUS

## 2016-05-30 NOTE — ED Triage Notes (Signed)
Pt is delusional with his conversation. Pt talking about "poisoned arrows", "sharks eating people" and "throwing people in the river." Pt grinds teeth loudly during questioning. Pt adds that he has been in "[place]" but has not had a seizure in "9 years." Pt appears to be poor historian. Pt denies pain and in is in NAD.

## 2016-05-30 NOTE — ED Triage Notes (Signed)
[facility] [place] [facility] EMS- Per EMS pt had witnessed seizure where pt fell. It is unknown if pt hit head but no trauma to head was noted. Pt did not have LOC. Pt has hx of seizures. Group home sitter sts that pt has had med change. Residents also advised that pt is at his baseline. EMS reports that pt was not post-ictal on scene. Pt in NAD

## 2016-05-30 NOTE — ED Notes (Signed)
[facility]olph [facility] informed him that we would send him back by Dustin FolksPTAR Ronald Roman states someone will be there to let him in.

## 2016-05-30 NOTE — ED Notes (Signed)
No respiratory or acute distress noted alert and talking steady gait noted when walked to restroom and back to bed call light in reach.

## 2016-05-30 NOTE — ED Notes (Signed)
Bed: [id number] Expected date: 05/30/16 Expected time: 3:03 PM Means of arrival: Ambulance Comments: Fall, seizure

## 2016-05-30 NOTE — ED Provider Notes (Signed)
[facility]-EMERGENCY DEPT Provider Note   CSN: [id number] Arrival date & time: 05/30/16  1502     History   Chief Complaint Chief Complaint  Patient presents with  . Seizures    HPI Ronald Roman is a 56 y.o. male.  The history is provided by the patient and the EMS personnel. No language interpreter was used.  Seizures     Ronald Roman is a 56 y.o. male who presents to the Emergency Department complaining of seizure.  Level V caveat due to psychiatric disorder and confusion.  Per EMS patient is a resident of a group home and there was a call out for seizure. There is no description of the event and patient was at his neurologic baseline per EMS. Patient does not recall what happened today but states he thinks he passed out. He denies any complaints currently. No headache, chest pain, shortness of breath, abdominal pain. He states that they have been giving him Xanax and Zoloft for the last 9 years for his seizures. He denies any SI, HI, hallucinations.  Per Group home staff patient ran out of his psychiatric medicines, last dizziness last night and he has a doctor's appointment tomorrow. He had a possible seizure today at the group home. Past Medical History:  Diagnosis Date  . Diabetes mellitus without complication (HCC)   . GERD (gastroesophageal reflux disease)   . Hyperlipidemia   . Hypertension   . Paranoid schizophrenia (HCC)   . Sleep apnea   . Uric acid nephrolithiasis     Patient Active Problem List   Diagnosis Date Noted  . Acute psychosis   . Leukocytosis 02/06/2015  . Sleep apnea 02/05/2015  . AKI (acute kidney injury) (HCC) 02/04/2015  . Seizure disorder (HCC) 02/04/2015  . Acute encephalopathy 02/04/2015  . Diabetes mellitus without complication (HCC)   . Hyperlipidemia   . Hypertension   . Paranoid schizophrenia (HCC)     History reviewed. No pertinent surgical history.     Home Medications    Prior to Admission medications   Medication Sig  Start Date End Date Taking? Authorizing Provider  amantadine (SYMMETREL) 100 MG capsule Take 1 capsule (100 mg total) by mouth 2 (two) times daily. 04/26/16  Yes Oneta Rack, NP  cloZAPine (CLOZARIL) 100 MG tablet Take 100 mg by mouth at bedtime. TAKE ALONG WITH 25 MG TABLET FOR TOTAL DOSE OF 125 MG ONCE DAILY AT BEDTIME   Yes Historical Provider, MD  cloZAPine (CLOZARIL) 25 MG tablet Take 5 tablets (125 mg total) by mouth at bedtime. Patient taking differently: Take 25 mg by mouth at bedtime. TAKE ALONG WITH 100 MG TABLET FOR A TOTAL DOSE OF 125 MG ONCE DAILY AT BEDTIME 04/27/16  Yes Adonis Brook, NP  cloZAPine (CLOZARIL) 25 MG tablet Take 0.5 tablets (12.5 mg total) by mouth daily. 04/27/16  Yes Adonis Brook, NP  divalproex (DEPAKOTE ER) 250 MG 24 hr tablet Take 5 tablets (1,250 mg total) by mouth at bedtime. 04/26/16  Yes Oneta Rack, NP  insulin aspart (NOVOLOG) 100 UNIT/ML injection Inject 4 Units into the skin 3 (three) times daily with meals. 04/26/16  Yes Oneta Rack, NP  insulin glargine (LANTUS) 100 UNIT/ML injection Inject 0.2 mLs (20 Units total) into the skin at bedtime. 04/26/16  Yes Oneta Rack, NP  lisinopril (PRINIVIL,ZESTRIL) 20 MG tablet Take 1 tablet (20 mg total) by mouth daily. 04/27/16  Yes Oneta Rack, NP  temazepam (RESTORIL) 15 MG capsule Take 1  capsule (15 mg total) by mouth at bedtime. 04/26/16  Yes Oneta Rack, NP  albuterol (PROVENTIL HFA;VENTOLIN HFA) 108 (90 BASE) MCG/ACT inhaler Inhale 2 puffs into the lungs every 4 (four) hours as needed for wheezing or shortness of breath. Patient not taking: Reported on 05/30/2016 04/21/15   Hayden Rasmussen, NP    Family History No family history on file.  Social History Social History  Substance Use Topics  . Smoking status: Current Every Day Smoker    Packs/day: 1.00    Types: Cigarettes  . Smokeless tobacco: Never Used  . Alcohol use No     Allergies   Cogentin [benztropine]; Penicillins; and Prolixin  [fluphenazine]   Review of Systems Review of Systems  Neurological: Positive for seizures.  All other systems reviewed and are negative.    Physical Exam Updated Vital Signs BP 140/88 (BP Location: Left Arm)   Pulse 93   Temp 98.4 F (36.9 C) (Oral)   Resp 20   SpO2 95%   Physical Exam  Constitutional: He is oriented to person, place, and time. He appears well-developed and well-nourished.  HENT:  Head: Normocephalic and atraumatic.  Bruxism  Cardiovascular: Regular rhythm.   No murmur heard. Tachycardic  Pulmonary/Chest: Effort normal and breath sounds normal. No respiratory distress.  Abdominal: Soft. There is no tenderness. There is no rebound and no guarding.  Musculoskeletal: He exhibits no edema or tenderness.  Neurological: He is alert and oriented to person, place, and time.  5 out of 5 strength in all 4 extremities. Sensation to light touch intact in all 4 extremities.  Skin: Skin is warm and dry.  Psychiatric:  Poor eye contact and flat affect. Disorganized thought process.  Nursing note and vitals reviewed.    ED Treatments / Results  Labs (all labs ordered are listed, but only abnormal results are displayed) Labs Reviewed  CBC WITH DIFFERENTIAL/PLATELET - Abnormal; Notable for the following:       Result Value   Monocytes Absolute 1.1 (*)    All other components within normal limits  RAPID URINE DRUG SCREEN, HOSP PERFORMED - Abnormal; Notable for the following:    Benzodiazepines POSITIVE (*)    All other components within normal limits  COMPREHENSIVE METABOLIC PANEL - Abnormal; Notable for the following:    Glucose, Bld 122 (*)    ALT 12 (*)    All other components within normal limits  ETHANOL  URINALYSIS, ROUTINE W REFLEX MICROSCOPIC  I-STAT TROPOININ, ED    EKG  EKG Interpretation  Date/Time:  Sunday May 30 2016 16:17:38 EST Ventricular Rate:  102 PR Interval:    QRS Duration: 87 QT Interval:  317 QTC Calculation: 413 R  Axis:   69 Text Interpretation:  Sinus tachycardia Confirmed by Lincoln Brigham [phone]) on 05/30/2016 4:22:01 PM       Radiology Dg Chest 2 View  Result Date: 05/30/2016 CLINICAL DATA:  Possible seizure today.  Fall. EXAM: CHEST  2 VIEW COMPARISON:  PA and lateral chest 04/26/2016 and 03/01/2016. FINDINGS: Lungs are clear. Heart size is normal. No pneumothorax or pleural effusion. No focal bony abnormality. IMPRESSION: Negative chest. Electronically Signed   By: Drusilla Kanner M.D.   On: 05/30/2016 16:44   Ct Head Wo Contrast  Result Date: 05/30/2016 CLINICAL DATA:  Seizure and fall today. EXAM: CT HEAD WITHOUT CONTRAST TECHNIQUE: Contiguous axial images were obtained from the base of the skull through the vertex without intravenous contrast. COMPARISON:  Head CT scan 05/28/2015. FINDINGS: Brain:  No acute abnormality including hemorrhage, infarct, mass lesion, mass effect, midline shift or abnormal extra-axial fluid collection. No hydrocephalus or pneumocephalus. Vascular: Atherosclerotic calcifications noted. Skull: Intact. Sinuses/Orbits: Minimal mucosal thickening right maxillary sinus noted. Other: None. IMPRESSION: No acute abnormality. Electronically Signed   By: Drusilla Kannerhomas  Dalessio M.D.   On: 05/30/2016 16:51    Procedures Procedures (including critical care time)  Medications Ordered in ED Medications  amantadine (SYMMETREL) capsule 100 mg (100 mg Oral Given 05/30/16 2124)  divalproex (DEPAKOTE ER) 24 hr tablet 1,250 mg (1,250 mg Oral Given 05/30/16 2124)  cloZAPine (CLOZARIL) tablet 100 mg (100 mg Oral Given 05/30/16 2204)  cloZAPine (CLOZARIL) tablet 25 mg (25 mg Oral Given 05/30/16 2205)  sodium chloride 0.9 % bolus 500 mL (0 mLs Intravenous Stopped 05/30/16 1908)     Initial Impression / Assessment and Plan / ED Course  I have reviewed the triage vital signs and the nursing notes.  Pertinent labs & imaging results that were available during my care of the patient were reviewed by me and  considered in my medical decision making (see chart for details).  Clinical Course     Patient here following possible seizure at home, out of his seizure medications. On repeat assessment in the emergency department he has no complaints and is calm and appropriate. He denies any SI or HI. He was given doses of his home medications for the evening. Plan to DC home with outpatient follow-up for refills of his prescriptions tomorrow.  Final Clinical Impressions(s) / ED Diagnoses   Final diagnoses:  Seizure-like activity [facility](HCC)    New Prescriptions Discharge Medication List as of 05/30/2016 10:17 PM       Tilden FossaElizabeth Ahren Pettinger, MD 05/31/16 [phone]

## 2016-05-30 NOTE — ED Notes (Signed)
Pt ambulated from the room to the bathroom very well and back to the pt room

## 2016-05-30 NOTE — ED Notes (Signed)
No respiratory or acute distress noted alert and talking oriented call light in reach no reaction to medication noted.

## 2016-06-15 ENCOUNTER — Encounter (HOSPITAL_COMMUNITY): Payer: Self-pay

## 2016-06-15 ENCOUNTER — Emergency Department (HOSPITAL_COMMUNITY)
Admission: EM | Admit: 2016-06-15 | Discharge: 2016-06-15 | Disposition: A | Discharge status: Home / Self Care | Payer: Medicaid Other | Attending: Emergency Medicine | Admitting: Emergency Medicine

## 2016-06-15 DIAGNOSIS — F17210 Nicotine dependence, cigarettes, uncomplicated: Secondary | ICD-10-CM | POA: Insufficient documentation

## 2016-06-15 DIAGNOSIS — R404 Transient alteration of awareness: Secondary | ICD-10-CM

## 2016-06-15 DIAGNOSIS — Z794 Long term (current) use of insulin: Secondary | ICD-10-CM | POA: Diagnosis not present

## 2016-06-15 DIAGNOSIS — I10 Essential (primary) hypertension: Secondary | ICD-10-CM | POA: Insufficient documentation

## 2016-06-15 DIAGNOSIS — R4182 Altered mental status, unspecified: Secondary | ICD-10-CM | POA: Diagnosis present

## 2016-06-15 NOTE — ED Notes (Signed)
Pt given sandwich and diet ginger ale.  Tolerated well.  Pt refusing vital signs.

## 2016-06-15 NOTE — ED Notes (Signed)
Bed: [facility] Expected date:  Expected time:  Means of arrival:  Comments: EMS-fall 

## 2016-06-15 NOTE — ED Notes (Signed)
Patient refused to let me collect labs.  He stated he was ready to go

## 2016-06-15 NOTE — ED Notes (Signed)
Gave report to group home caregiver

## 2016-06-15 NOTE — ED Notes (Signed)
[facility] refused blood draw.

## 2016-06-15 NOTE — ED Notes (Signed)
PT made aware of urine sample 

## 2016-06-15 NOTE — ED Triage Notes (Signed)
Per EMS report:  Pt was picked up from an adult day care where he was found on the floor.  Normally lives in a group home.  EMS states he awakes to pain. He opened eyes when moved from one stretcher to the other then shut eyes again.  Not following commands.  VS: 100/p, 108 ST, CBG 103, 99RA, 18g LAC, pupils constricted per EMS as well. Hx schizophrenia

## 2016-06-15 NOTE — ED Provider Notes (Signed)
[facility]-EMERGENCY DEPT Provider Note   CSN: [id number] Arrival date & time: 06/15/16  1114     History   Chief Complaint Chief Complaint  Patient presents with  . Altered Mental Status    HPI Ronald Roman is a 56 y.o. male.  The history is provided by the patient. No language interpreter was used.  Altered Mental Status   This is a new problem. The current episode started less than 1 hour ago. Associated symptoms include seizures and unresponsiveness. His past medical history is significant for seizures.  Pt here by EMS.  Possible seizure.  Pt has normal cbg.  Pt has a history of seizures, schizophrenia and diabetes.  Pt refuses to have his blood drawn.  Pt sleepy   Past Medical History:  Diagnosis Date  . Diabetes mellitus without complication (HCC)   . GERD (gastroesophageal reflux disease)   . Hyperlipidemia   . Hypertension   . Paranoid schizophrenia (HCC)   . Sleep apnea   . Uric acid nephrolithiasis     Patient Active Problem List   Diagnosis Date Noted  . Acute psychosis   . Leukocytosis 02/06/2015  . Sleep apnea 02/05/2015  . AKI (acute kidney injury) (HCC) 02/04/2015  . Seizure disorder (HCC) 02/04/2015  . Acute encephalopathy 02/04/2015  . Diabetes mellitus without complication (HCC)   . Hyperlipidemia   . Hypertension   . Paranoid schizophrenia (HCC)     History reviewed. No pertinent surgical history.     Home Medications    Prior to Admission medications   Medication Sig Start Date End Date Taking? Authorizing Provider  amantadine (SYMMETREL) 100 MG capsule Take 1 capsule (100 mg total) by mouth 2 (two) times daily. 04/26/16  Yes Oneta Rack, NP  cloZAPine (CLOZARIL) 100 MG tablet Take 100 mg by mouth at bedtime. Take with 25 mg tablet for a total of 125 mg nightly   Yes Historical Provider, MD  cloZAPine (CLOZARIL) 25 MG tablet Take 5 tablets (125 mg total) by mouth at bedtime. Patient taking differently: Take 25 mg by mouth at bedtime.  TAKE ALONG WITH 100 MG TABLET FOR A TOTAL DOSE OF 125 MG ONCE DAILY AT BEDTIME 04/27/16  Yes Adonis Brook, NP  divalproex (DEPAKOTE ER) 250 MG 24 hr tablet Take 5 tablets (1,250 mg total) by mouth at bedtime. 04/26/16  Yes Oneta Rack, NP  insulin aspart (NOVOLOG) 100 UNIT/ML injection Inject 4 Units into the skin 3 (three) times daily with meals. 04/26/16  Yes Oneta Rack, NP  insulin glargine (LANTUS) 100 UNIT/ML injection Inject 0.2 mLs (20 Units total) into the skin at bedtime. 04/26/16  Yes Oneta Rack, NP  lisinopril (PRINIVIL,ZESTRIL) 20 MG tablet Take 1 tablet (20 mg total) by mouth daily. 04/27/16  Yes Oneta Rack, NP  temazepam (RESTORIL) 15 MG capsule Take 1 capsule (15 mg total) by mouth at bedtime. 04/26/16  Yes Oneta Rack, NP  albuterol (PROVENTIL HFA;VENTOLIN HFA) 108 (90 BASE) MCG/ACT inhaler Inhale 2 puffs into the lungs every 4 (four) hours as needed for wheezing or shortness of breath. Patient not taking: Reported on 05/30/2016 04/21/15   Hayden Rasmussen, NP    Family History History reviewed. No pertinent family history.  Social History Social History  Substance Use Topics  . Smoking status: Current Every Day Smoker    Packs/day: 1.00    Types: Cigarettes  . Smokeless tobacco: Never Used  . Alcohol use No     Allergies  Cogentin [benztropine]; Penicillins; and Prolixin [fluphenazine]   Review of Systems Review of Systems  Neurological: Positive for seizures.  All other systems reviewed and are negative.    Physical Exam Updated Vital Signs BP (!) 114/53 Comment: PT unable to hold arm down  Pulse 81   Temp 99.1 F (37.3 C) (Oral)   Resp 13   SpO2 94%   Physical Exam  Constitutional: He appears well-developed and well-nourished.  HENT:  Head: Normocephalic and atraumatic.  Right Ear: External ear normal.  Left Ear: External ear normal.  Eyes: Conjunctivae are normal.  Neck: Normal range of motion. Neck supple.  Cardiovascular: Normal rate,  regular rhythm and normal heart sounds.   No murmur heard. Pulmonary/Chest: Effort normal and breath sounds normal. No respiratory distress.  Abdominal: Soft. There is no tenderness.  Musculoskeletal: He exhibits no edema.  Neurological: He is alert.  Skin: Skin is warm and dry.  Psychiatric: He has a normal mood and affect.  Nursing note and vitals reviewed.    ED Treatments / Results  Labs (all labs ordered are listed, but only abnormal results are displayed) Labs Reviewed - No data to display  EKG  EKG Interpretation None       Radiology No results found.  Procedures Procedures (including critical care time)  Medications Ordered in ED Medications - No data to display   Initial Impression / Assessment and Plan / ED Course  I have reviewed the triage vital signs and the nursing notes.  Pertinent labs & imaging results that were available during my care of the patient were reviewed by me and considered in my medical decision making (see chart for details).     Pt refuses blood draw.  Pt falls asleep but awakes easily.  He states he does not want to be here.  Pt is able to eat and drink.  Pt sits up and talks with staff.   I feel like he is safe to return to his home.  Nursing staff will contact group home.    Final Clinical Impressions(s) / ED Diagnoses   Final diagnoses:  Transient alteration of awareness    New Prescriptions New Prescriptions   No medications on file  An After Visit Summary was printed and given to the patient.   Lonia SkinnerLeslie K Prineville Lake AcresSofia, PA-C 06/15/16 1635    Lyndal Pulleyaniel Knott, MD 06/15/16 2300

## 2016-06-15 NOTE — ED Notes (Signed)
Attempted to draw blood from pt's IV site.  It would not pull any blood back and when flushed, it infiltrated.  Pt didn't awake during this and tried to draw blood with butterfly without success.

## 2016-06-24 ENCOUNTER — Other Ambulatory Visit (HOSPITAL_COMMUNITY): Payer: Self-pay | Admitting: Psychiatry

## 2016-06-28 ENCOUNTER — Other Ambulatory Visit (HOSPITAL_COMMUNITY): Payer: Self-pay | Admitting: Psychiatry

## 2016-07-02 ENCOUNTER — Other Ambulatory Visit (HOSPITAL_COMMUNITY): Payer: Self-pay | Admitting: Psychiatry

## 2016-07-22 ENCOUNTER — Other Ambulatory Visit (HOSPITAL_COMMUNITY): Payer: Self-pay | Admitting: Psychiatry

## 2016-08-06 ENCOUNTER — Other Ambulatory Visit (HOSPITAL_COMMUNITY): Payer: Self-pay | Admitting: Psychiatry

## 2017-04-02 ENCOUNTER — Emergency Department (HOSPITAL_COMMUNITY): Payer: Medicaid Other

## 2017-04-02 ENCOUNTER — Other Ambulatory Visit: Payer: Self-pay

## 2017-04-02 ENCOUNTER — Encounter (HOSPITAL_COMMUNITY): Payer: Self-pay

## 2017-04-02 ENCOUNTER — Emergency Department (HOSPITAL_COMMUNITY)
Admission: EM | Admit: 2017-04-02 | Discharge: 2017-04-02 | Disposition: A | Discharge status: Home / Self Care | Payer: Medicaid Other | Attending: Emergency Medicine | Admitting: Emergency Medicine

## 2017-04-02 DIAGNOSIS — G40909 Epilepsy, unspecified, not intractable, without status epilepticus: Secondary | ICD-10-CM | POA: Diagnosis present

## 2017-04-02 DIAGNOSIS — Z79899 Other long term (current) drug therapy: Secondary | ICD-10-CM | POA: Diagnosis not present

## 2017-04-02 DIAGNOSIS — Z794 Long term (current) use of insulin: Secondary | ICD-10-CM | POA: Insufficient documentation

## 2017-04-02 DIAGNOSIS — E119 Type 2 diabetes mellitus without complications: Secondary | ICD-10-CM | POA: Diagnosis not present

## 2017-04-02 DIAGNOSIS — F17210 Nicotine dependence, cigarettes, uncomplicated: Secondary | ICD-10-CM | POA: Insufficient documentation

## 2017-04-02 DIAGNOSIS — I10 Essential (primary) hypertension: Secondary | ICD-10-CM | POA: Diagnosis not present

## 2017-04-02 DIAGNOSIS — W19XXXA Unspecified fall, initial encounter: Secondary | ICD-10-CM

## 2017-04-02 LAB — CBC WITH DIFFERENTIAL/PLATELET
BASOS ABS: 0 10*3/uL (ref 0.0–0.1)
Basophils Relative: 0 %
Eosinophils Absolute: 0.1 10*3/uL (ref 0.0–0.7)
Eosinophils Relative: 1 %
HEMATOCRIT: 44.3 % (ref 39.0–52.0)
Hemoglobin: 14.6 g/dL (ref 13.0–17.0)
LYMPHS PCT: 19 %
Lymphs Abs: 2 10*3/uL (ref 0.7–4.0)
MCH: 27.5 pg (ref 26.0–34.0)
MCHC: 33 g/dL (ref 30.0–36.0)
MCV: 83.4 fL (ref 78.0–100.0)
MONO ABS: 1.2 10*3/uL — AB (ref 0.1–1.0)
Monocytes Relative: 11 %
NEUTROS ABS: 7.3 10*3/uL (ref 1.7–7.7)
Neutrophils Relative %: 69 %
Platelets: 245 10*3/uL (ref 150–400)
RBC: 5.31 MIL/uL (ref 4.22–5.81)
RDW: 15 % (ref 11.5–15.5)
WBC: 10.5 10*3/uL (ref 4.0–10.5)

## 2017-04-02 LAB — COMPREHENSIVE METABOLIC PANEL
ALBUMIN: 4 g/dL (ref 3.5–5.0)
ALT: 21 U/L (ref 17–63)
ANION GAP: 9 (ref 5–15)
AST: 35 U/L (ref 15–41)
Alkaline Phosphatase: 92 U/L (ref 38–126)
BILIRUBIN TOTAL: 0.8 mg/dL (ref 0.3–1.2)
BUN: 19 mg/dL (ref 6–20)
CO2: 22 mmol/L (ref 22–32)
Calcium: 10.1 mg/dL (ref 8.9–10.3)
Chloride: 106 mmol/L (ref 101–111)
Creatinine, Ser: 1.3 mg/dL — ABNORMAL HIGH (ref 0.61–1.24)
GFR calc Af Amer: >60 mL/min (ref >60)
GFR calc non Af Amer: 60 mL/min — ABNORMAL LOW (ref >60)
Glucose, Bld: 177 mg/dL — ABNORMAL HIGH (ref 65–99)
POTASSIUM: 5.5 mmol/L — AB (ref 3.5–5.1)
SODIUM: 137 mmol/L (ref 135–145)
TOTAL PROTEIN: 7 g/dL (ref 6.5–8.1)

## 2017-04-02 LAB — VALPROIC ACID LEVEL: Valproic Acid Lvl: 47 ug/mL — ABNORMAL LOW (ref 50.0–100.0)

## 2017-04-02 LAB — CBG MONITORING, ED: GLUCOSE-CAPILLARY: 212 mg/dL — AB (ref 65–99)

## 2017-04-02 MED ORDER — ACETAMINOPHEN 325 MG PO TABS
650.0000 mg | ORAL_TABLET | Freq: Once | ORAL | Status: AC
  Administered 2017-04-02: 650 mg via ORAL
  Filled 2017-04-02: qty 2

## 2017-04-02 NOTE — ED Provider Notes (Addendum)
[facility] Provider [facility]   CSN: [id number] Arrival date & time: 04/02/17  1654     History   Chief Complaint Chief Complaint  Patient presents with  . Seizures  . Fall    HPI Ronald Roman is a 56 y.o. male.  56 year old male presents by EMS for evaluation of possible seizure. He was found in his group home, on floor next to bed. EMS reports that patient appeared confused initially and appeared post-ictal. Patient now reports to MD that he "had a seizure" and "fell on the floor." He complains of a headache now. He denies chest pain, shortness of breath, nausea, vomiting, or other acute complaint. He cannot recall when his last seizure was.    The history is provided by the patient and the EMS personnel.  Seizures   This is a new problem. The current episode started 1 to 2 hours ago. The problem has been gradually improving. Number of times: unknown. Duration: unknown. Associated symptoms include confusion and headaches. unknown The episode was not witnessed. There has been no fever.    Past Medical History:  Diagnosis Date  . Diabetes mellitus without complication (HCC)   . GERD (gastroesophageal reflux disease)   . Hyperlipidemia   . Hypertension   . Paranoid schizophrenia (HCC)   . Sleep apnea   . Uric acid nephrolithiasis     Patient Active Problem List   Diagnosis Date Noted  . Acute psychosis (HCC)   . Leukocytosis 02/06/2015  . Sleep apnea 02/05/2015  . AKI (acute kidney injury) (HCC) 02/04/2015  . Seizure disorder (HCC) 02/04/2015  . Acute encephalopathy 02/04/2015  . Diabetes mellitus without complication (HCC)   . Hyperlipidemia   . Hypertension   . Paranoid schizophrenia (HCC)     History reviewed. No pertinent surgical history.     Home Medications    Prior to Admission medications   Medication Sig Start Date End Date Taking? Authorizing Provider  albuterol (PROVENTIL HFA;VENTOLIN HFA) 108 (90 BASE)  MCG/ACT inhaler Inhale 2 puffs into the lungs every 4 (four) hours as needed for wheezing or shortness of breath. Patient not taking: Reported on 05/30/2016 04/21/15   Hayden RasmussenMabe, David, NP  amantadine (SYMMETREL) 100 MG capsule Take 1 capsule (100 mg total) by mouth 2 (two) times daily. 04/26/16   Oneta RackLewis, Tanika N, NP  cloZAPine (CLOZARIL) 100 MG tablet Take 100 mg by mouth at bedtime. Take with 25 mg tablet for a total of 125 mg nightly    [provider]  cloZAPine (CLOZARIL) 25 MG tablet Take 5 tablets (125 mg total) by mouth at bedtime. Patient taking differently: Take 25 mg by mouth at bedtime. TAKE ALONG WITH 100 MG TABLET FOR A TOTAL DOSE OF 125 MG ONCE DAILY AT BEDTIME 04/27/16   Adonis BrookAgustin, Sheila, NP  divalproex (DEPAKOTE ER) 250 MG 24 hr tablet Take 5 tablets (1,250 mg total) by mouth at bedtime. 04/26/16   Oneta RackLewis, Tanika N, NP  insulin aspart (NOVOLOG) 100 UNIT/ML injection Inject 4 Units into the skin 3 (three) times daily with meals. 04/26/16   Oneta RackLewis, Tanika N, NP  insulin glargine (LANTUS) 100 UNIT/ML injection Inject 0.2 mLs (20 Units total) into the skin at bedtime. 04/26/16   Oneta RackLewis, Tanika N, NP  lisinopril (PRINIVIL,ZESTRIL) 20 MG tablet Take 1 tablet (20 mg total) by mouth daily. 04/27/16   Oneta RackLewis, Tanika N, NP  temazepam (RESTORIL) 15 MG capsule Take 1 capsule (15 mg total) by mouth at  bedtime. 04/26/16   Oneta RackLewis, Tanika N, NP    Family History History reviewed. No pertinent family history.  Social History Social History   Tobacco Use  . Smoking status: Current Every Day Smoker    Packs/day: 1.00    Types: Cigarettes  . Smokeless tobacco: Never Used  Substance Use Topics  . Alcohol use: No  . Drug use: No     Allergies   Cogentin [benztropine]; Penicillins; and Prolixin [fluphenazine]   Review of Systems Review of Systems  Neurological: Positive for seizures and headaches.  Psychiatric/Behavioral: Positive for confusion.  All other systems reviewed and are  negative.    Physical Exam Updated Vital Signs BP 118/86   Pulse (!) 118   Temp 98.8 F (37.1 C) (Oral)   Resp 20   SpO2 98%   Physical Exam  Constitutional: He appears well-developed and well-nourished.  HENT:  Head: Normocephalic and atraumatic.  Mouth/Throat: Oropharynx is clear and moist.  Eyes: Conjunctivae and EOM are normal. Pupils are equal, round, and reactive to light.  Neck: Normal range of motion. Neck supple.  Cardiovascular: Normal rate, regular rhythm and normal heart sounds.  No murmur heard. Pulmonary/Chest: Effort normal and breath sounds normal. No respiratory distress.  Abdominal: Soft. Bowel sounds are normal. He exhibits no distension. There is no tenderness.  Musculoskeletal: Normal range of motion. He exhibits no edema.  Neurological: He is alert. No cranial nerve deficit.  Skin: Skin is warm and dry.  Psychiatric: He has a normal mood and affect.  Nursing note and vitals reviewed.    ED Treatments / Results  Labs (all labs ordered are listed, but only abnormal results are displayed) Labs Reviewed  CBG MONITORING, ED - Abnormal; Notable for the following components:      Result Value   Glucose-Capillary 212 (*)    All other components within normal limits  COMPREHENSIVE METABOLIC PANEL  CBC WITH DIFFERENTIAL/PLATELET  VALPROIC ACID LEVEL  URINALYSIS, ROUTINE W REFLEX MICROSCOPIC    EKG  EKG Interpretation None     1702 EKG - Sinus Tach - HR 115 QRS 89 No acute ischemic changes.   Radiology No results found.  Procedures Procedures (including critical care time)  Medications Ordered in ED Medications - No data to display   Initial Impression / Assessment and Plan / ED Course  I have reviewed the triage vital signs and the nursing notes.  Pertinent labs & imaging results that were available during my care of the patient were reviewed by me and considered in my medical decision making (see chart for details).     MSE Screen  Complete  Patient presents for evaluation following suspected seizure with fall. Screening evaluation in ED reveals no acute process. Patient now desires DC home. He declines further evaluation in the ED. He is aware of the need for close FU. Strict return precautions given and understood.   Final Clinical Impressions(s) / ED Diagnoses   Final diagnoses:  Fall, initial encounter  Seizure disorder [facility](HCC)    ED Discharge Orders    None       Wynetta FinesMessick, Mickey Esguerra C, MD 04/02/17 2040    Wynetta FinesMessick, Karl Knarr C, MD 04/03/17 [phone]

## 2017-04-02 NOTE — ED Notes (Signed)
Left side of upper lip appears to be swollen.  Pt reports he fell on lip and shoulders.

## 2017-04-02 NOTE — ED Notes (Signed)
[place] [phone] care-taker

## 2017-04-02 NOTE — ED Notes (Signed)
Called Mr. [place] @ [phone], he will pick pt up from ED.

## 2017-04-02 NOTE — Discharge Instructions (Signed)
Please return for any problem.  °

## 2017-04-02 NOTE — ED Triage Notes (Addendum)
To room via EMS.  [facility]  Staff found pt on floor.  Pt has urinated on self, unknown seizure activity.  Pt arouses in ED and reports he had a seizure and fell on floor.  A&O to self except DOB & age, situation (reports had seizure and fell), month/year.

## 2017-05-06 ENCOUNTER — Encounter: Payer: Self-pay | Admitting: Podiatry

## 2017-05-06 ENCOUNTER — Ambulatory Visit (INDEPENDENT_AMBULATORY_CARE_PROVIDER_SITE_OTHER): Payer: Medicaid Other | Admitting: Podiatry

## 2017-05-06 VITALS — BP 130/88 | HR 111

## 2017-05-06 DIAGNOSIS — B351 Tinea unguium: Secondary | ICD-10-CM | POA: Diagnosis not present

## 2017-05-06 DIAGNOSIS — M79674 Pain in right toe(s): Secondary | ICD-10-CM | POA: Diagnosis not present

## 2017-05-06 DIAGNOSIS — M79675 Pain in left toe(s): Secondary | ICD-10-CM | POA: Diagnosis not present

## 2017-05-06 DIAGNOSIS — E119 Type 2 diabetes mellitus without complications: Secondary | ICD-10-CM | POA: Diagnosis not present

## 2017-05-06 NOTE — Progress Notes (Signed)
   Subjective:    Patient ID: Ronald Roman, male    DOB: [birth year], 56 y.o.   MRN: [id number]  HPI this patient presents the office with chief complaint of long thick painful nails.  The nails are painful walking and wearing his shoes.  This patient is diabetic taking insulin.  Marland Kitchen. He presents the office today for preventative foot care services    Review of Systems  All other systems reviewed and are negative.      Objective:   Physical Exam General Appearance  Alert, conversant and in no acute stress.  Vascular  Dorsalis pedis and posterior pulses are palpable  bilaterally.  Capillary return is within normal limits  Bilaterally. Temperature is within normal limits  Bilaterally  Neurologic  Senn-Weinstein monofilament wire test within normal limits  bilaterally. Muscle power  Within normal limits bilaterally.  Nails Thick disfigured discolored nails with subungual debris bilaterally from hallux to fifth toes bilaterally. No evidence of bacterial infection or drainage bilaterally.  Orthopedic  No limitations of motion of motion feet bilaterally.  No crepitus or effusions noted.  No bony pathology or digital deformities noted.  Skin  normotropic skin with no porokeratosis noted bilaterally.  No signs of infections or ulcers noted.          Assessment & Plan:  Onychomycosis  B/L  Diabetes with no foot complications.   IE  Debride nails  RTC 3 months.   Helane GuntherGregory Geffrey Roman DPM

## 2017-05-13 ENCOUNTER — Other Ambulatory Visit: Payer: Self-pay

## 2017-05-13 ENCOUNTER — Encounter (HOSPITAL_COMMUNITY): Payer: Self-pay

## 2017-05-13 ENCOUNTER — Emergency Department (HOSPITAL_COMMUNITY)
Admission: EM | Admit: 2017-05-13 | Discharge: 2017-05-13 | Disposition: A | Discharge status: Home / Self Care | Payer: Medicaid Other | Attending: Emergency Medicine | Admitting: Emergency Medicine

## 2017-05-13 DIAGNOSIS — I10 Essential (primary) hypertension: Secondary | ICD-10-CM | POA: Insufficient documentation

## 2017-05-13 DIAGNOSIS — R197 Diarrhea, unspecified: Secondary | ICD-10-CM | POA: Diagnosis not present

## 2017-05-13 DIAGNOSIS — Z79899 Other long term (current) drug therapy: Secondary | ICD-10-CM | POA: Insufficient documentation

## 2017-05-13 DIAGNOSIS — E785 Hyperlipidemia, unspecified: Secondary | ICD-10-CM | POA: Diagnosis not present

## 2017-05-13 DIAGNOSIS — F17210 Nicotine dependence, cigarettes, uncomplicated: Secondary | ICD-10-CM | POA: Diagnosis not present

## 2017-05-13 DIAGNOSIS — R1033 Periumbilical pain: Secondary | ICD-10-CM | POA: Diagnosis not present

## 2017-05-13 DIAGNOSIS — Z794 Long term (current) use of insulin: Secondary | ICD-10-CM | POA: Insufficient documentation

## 2017-05-13 DIAGNOSIS — E119 Type 2 diabetes mellitus without complications: Secondary | ICD-10-CM | POA: Diagnosis not present

## 2017-05-13 LAB — COMPREHENSIVE METABOLIC PANEL
ALBUMIN: 3.6 g/dL (ref 3.5–5.0)
ALK PHOS: 79 U/L (ref 38–126)
ALT: 15 U/L — AB (ref 17–63)
AST: 22 U/L (ref 15–41)
Anion gap: 8 (ref 5–15)
BUN: 20 mg/dL (ref 6–20)
CALCIUM: 9.3 mg/dL (ref 8.9–10.3)
CO2: 22 mmol/L (ref 22–32)
Chloride: 108 mmol/L (ref 101–111)
Creatinine, Ser: 1.09 mg/dL (ref 0.61–1.24)
GFR calc Af Amer: >60 mL/min (ref >60)
GFR calc non Af Amer: >60 mL/min (ref >60)
GLUCOSE: 173 mg/dL — AB (ref 65–99)
Potassium: 4.6 mmol/L (ref 3.5–5.1)
Sodium: 138 mmol/L (ref 135–145)
Total Bilirubin: 0.4 mg/dL (ref 0.3–1.2)
Total Protein: 7 g/dL (ref 6.5–8.1)

## 2017-05-13 LAB — URINALYSIS, ROUTINE W REFLEX MICROSCOPIC
BILIRUBIN URINE: NEGATIVE
Glucose, UA: NEGATIVE mg/dL
HGB URINE DIPSTICK: NEGATIVE
KETONES UR: NEGATIVE mg/dL
Leukocytes, UA: NEGATIVE
NITRITE: NEGATIVE
PH: 5 (ref 5.0–8.0)
Protein, ur: NEGATIVE mg/dL
Specific Gravity, Urine: 1.014 (ref 1.005–1.030)

## 2017-05-13 LAB — CBC
HCT: 41.8 % (ref 39.0–52.0)
Hemoglobin: 13.6 g/dL (ref 13.0–17.0)
MCH: 27.8 pg (ref 26.0–34.0)
MCHC: 32.5 g/dL (ref 30.0–36.0)
MCV: 85.5 fL (ref 78.0–100.0)
Platelets: 217 10*3/uL (ref 150–400)
RBC: 4.89 MIL/uL (ref 4.22–5.81)
RDW: 15 % (ref 11.5–15.5)
WBC: 8.5 10*3/uL (ref 4.0–10.5)

## 2017-05-13 LAB — LIPASE, BLOOD: Lipase: 34 U/L (ref 11–51)

## 2017-05-13 MED ORDER — DICYCLOMINE HCL 20 MG PO TABS: 20.0000 mg | ORAL_TABLET | Freq: Two times a day (BID) | ORAL | 0 refills | Status: DC

## 2017-05-13 MED ORDER — CULTURELLE DIGESTIVE HEALTH PO CAPS: 1.0000 | ORAL_CAPSULE | Freq: Every day | ORAL | 0 refills | Status: DC

## 2017-05-13 NOTE — ED Triage Notes (Signed)
[facility] is [facility] [phone]). Patient c/o abdominal pain and diarrhea x 6 months.

## 2017-05-13 NOTE — ED Notes (Signed)
Pt ambulatory and independent at discharge.  Verbalized understanding of discharge instructions 

## 2017-05-13 NOTE — ED Notes (Signed)
Called caregiver to retrieve patient. Explained discharge instructions.  Pt placed in lobby to wait for ride per caregiver request. Pt ambulatory and independent at discharge.

## 2017-05-13 NOTE — ED Provider Notes (Signed)
Medical screening examination/treatment/procedure(s) were conducted as a shared visit with non-physician practitioner(s) and myself.  I personally evaluated the patient during the encounter.   EKG Interpretation None       Results for orders placed or performed during the hospital encounter of 05/13/17  Lipase, blood  Result Value Ref Range   Lipase 34 11 - 51 U/L  Comprehensive metabolic panel  Result Value Ref Range   Sodium 138 135 - 145 mmol/L   Potassium 4.6 3.5 - 5.1 mmol/L   Chloride 108 101 - 111 mmol/L   CO2 22 22 - 32 mmol/L   Glucose, Bld 173 (H) 65 - 99 mg/dL   BUN 20 6 - 20 mg/dL   Creatinine, Ser 7.821.09 0.61 - 1.24 mg/dL   Calcium 9.3 8.9 - 95.610.3 mg/dL   Total Protein 7.0 6.5 - 8.1 g/dL   Albumin 3.6 3.5 - 5.0 g/dL   AST 22 15 - 41 U/L   ALT 15 (L) 17 - 63 U/L   Alkaline Phosphatase 79 38 - 126 U/L   Total Bilirubin 0.4 0.3 - 1.2 mg/dL   GFR calc non Af Amer >60 >60 mL/min   GFR calc Af Amer >60 >60 mL/min   Anion gap 8 5 - 15  CBC  Result Value Ref Range   WBC 8.5 4.0 - 10.5 K/uL   RBC 4.89 4.22 - 5.81 MIL/uL   Hemoglobin 13.6 13.0 - 17.0 g/dL   HCT 21.341.8 08.639.0 - 57.852.0 %   MCV 85.5 78.0 - 100.0 fL   MCH 27.8 26.0 - 34.0 pg   MCHC 32.5 30.0 - 36.0 g/dL   RDW 46.915.0 62.911.5 - 52.815.5 %   Platelets 217 150 - 400 K/uL  Urinalysis, Routine w reflex microscopic  Result Value Ref Range   Color, Urine YELLOW YELLOW   APPearance CLEAR CLEAR   Specific Gravity, Urine 1.014 1.005 - 1.030   pH 5.0 5.0 - 8.0   Glucose, UA NEGATIVE NEGATIVE mg/dL   Hgb urine dipstick NEGATIVE NEGATIVE   Bilirubin Urine NEGATIVE NEGATIVE   Ketones, ur NEGATIVE NEGATIVE mg/dL   Protein, ur NEGATIVE NEGATIVE mg/dL   Nitrite NEGATIVE NEGATIVE   Leukocytes, UA NEGATIVE NEGATIVE   No results found.  Patient seen by me along with physician assistant.  [facility]  Patient here with the complaint of diarrhea and abdominal pain for 6 months.  Patient appears  reasonably stable here labs without significant abnormalities.  However further outpatient evaluation would be appropriate.  Patient will be given referral to gastroenterology.  Patient's abdomen is got a little bit of distention but otherwise it soft and nontender.  Normal bowel sounds.  Lungs clear heart regular.  Patient stable to return to family care home and follow-up with gastroenterology.   Vanetta MuldersZackowski, Sairah Knobloch, MD 05/13/17 2201

## 2017-05-14 NOTE — ED Provider Notes (Signed)
[facility]-EMERGENCY DEPT Provider Note   CSN: [id number] Arrival date & time: 05/13/17  1713     History   Chief Complaint Chief Complaint  Patient presents with  . Abdominal Pain  . Diarrhea    HPI  Ronald Roman is a 56 y.o. Male history of diabetes, hypertension, hyperlipidemia, GERD and paranoid schizophrenia, presents complaining of 6 months of intermittent periumbilical abdominal pain and diarrhea.  Patient reports symptoms come and go, primarily after eating, but he has not noticed any specific food patterns.  Patient reports sharp, cramping abdominal pains, no pain currently, but reports he had some earlier today.  Patient reports frequent episodes of diarrhea, which is loose, but nonbloody, no recent antibiotics, patient reports he has normal stools in between as well.  Denies any episodes of nausea or vomiting.  No fevers or chills.  Patient has not done anything outpatient to treat the symptoms.  Not followed up with his primary care provider or GI regarding this.  Patient denies any dysuria, no chest pain, shortness of breath.      Past Medical History:  Diagnosis Date  . Diabetes mellitus without complication (HCC)   . GERD (gastroesophageal reflux disease)   . Hyperlipidemia   . Hypertension   . Paranoid schizophrenia (HCC)   . Sleep apnea   . Uric acid nephrolithiasis     Patient Active Problem List   Diagnosis Date Noted  . Acute psychosis (HCC)   . Leukocytosis 02/06/2015  . Sleep apnea 02/05/2015  . AKI (acute kidney injury) (HCC) 02/04/2015  . Seizure disorder (HCC) 02/04/2015  . Acute encephalopathy 02/04/2015  . Diabetes mellitus without complication (HCC)   . Hyperlipidemia   . Hypertension   . Paranoid schizophrenia (HCC)     History reviewed. No pertinent surgical history.     Home Medications    Prior to Admission medications   Medication Sig Start Date End Date Taking? Authorizing Provider  amantadine  (SYMMETREL) 100 MG capsule Take 1 capsule (100 mg total) by mouth 2 (two) times daily. 04/26/16  Yes Oneta Rack, NP  cloZAPine (CLOZARIL) 100 MG tablet Take 150 mg by mouth daily.    Yes [provider]  divalproex (DEPAKOTE) 250 MG DR tablet Take 250 mg by mouth at bedtime.   Yes [provider]  divalproex (DEPAKOTE) 500 MG DR tablet Take 1,000 mg by mouth at bedtime.   Yes [provider]  insulin glargine (LANTUS) 100 UNIT/ML injection Inject 0.2 mLs (20 Units total) into the skin at bedtime. Patient taking differently: Inject 25 Units into the skin at bedtime.  04/26/16  Yes Oneta Rack, NP  lisinopril (PRINIVIL,ZESTRIL) 20 MG tablet Take 1 tablet (20 mg total) by mouth daily. 04/27/16  Yes Oneta Rack, NP  metFORMIN (GLUCOPHAGE) 1000 MG tablet Take 1,000 mg by mouth 2 (two) times daily with a meal.   Yes [provider]  QUEtiapine (SEROQUEL) 100 MG tablet Take 100 mg by mouth at bedtime.   Yes [provider]  QUEtiapine (SEROQUEL) 50 MG tablet Take 50 mg by mouth every morning.    Yes [provider]  zolpidem (AMBIEN) 10 MG tablet Take 10 mg by mouth at bedtime.    Yes [provider]  albuterol (PROVENTIL HFA;VENTOLIN HFA) 108 (90 BASE) MCG/ACT inhaler Inhale 2 puffs into the lungs every 4 (four) hours as needed for wheezing or shortness of breath. Patient not taking: Reported on 05/13/2017 04/21/15   Mabe,  Onalee Huaavid, NP  dicyclomine (BENTYL) 20 MG tablet Take 1 tablet (20 mg total) by mouth 2 (two) times daily. 05/13/17   Dartha LodgeFord, Braxen Dobek N, PA-C  divalproex (DEPAKOTE ER) 250 MG 24 hr tablet Take 5 tablets (1,250 mg total) by mouth at bedtime. Patient not taking: Reported on 05/13/2017 04/26/16   Oneta RackLewis, Tanika N, NP  divalproex (DEPAKOTE) 500 MG DR tablet Take 500 mg by mouth 2 (two) times daily.    [provider]  Lactobacillus-Inulin (CULTURELLE DIGESTIVE HEALTH) CAPS Take 1 capsule by mouth daily. 05/13/17    Dartha LodgeFord, Marysue Fait N, PA-C    Family History No family history on file.  Social History Social History   Tobacco Use  . Smoking status: Current Every Day Smoker    Packs/day: 1.00    Types: Cigarettes  . Smokeless tobacco: Never Used  Substance Use Topics  . Alcohol use: No  . Drug use: No     Allergies   Cogentin [benztropine]; Penicillins; and Prolixin [fluphenazine]   Review of Systems Review of Systems  Constitutional: Negative for chills and fever.  Eyes: Negative for discharge, redness and itching.  Respiratory: Negative for cough, chest tightness and shortness of breath.   Cardiovascular: Negative for chest pain and palpitations.  Gastrointestinal: Positive for abdominal pain and diarrhea. Negative for blood in stool, nausea and vomiting.  Genitourinary: Negative for enuresis.  Musculoskeletal: Negative for arthralgias and myalgias.  Skin: Negative for rash.  Neurological: Negative for dizziness, weakness, numbness and headaches.  All other systems reviewed and are negative.    Physical Exam Updated Vital Signs BP 130/83 (BP Location: Left Arm)   Pulse (!) 108   Temp 99.2 F (37.3 C) (Oral)   Resp 16   Ht 5\' 9"  (1.753 m)   Wt 102.5 kg (226 lb)   SpO2 99%   BMI 33.37 kg/m   Physical Exam  Constitutional: He appears well-developed and well-nourished. No distress.  HENT:  Head: Normocephalic and atraumatic.  Eyes: Right eye exhibits no discharge. Left eye exhibits no discharge.  Neck: Neck supple.  Cardiovascular: Normal rate, regular rhythm and normal heart sounds.  Pulmonary/Chest: Effort normal and breath sounds normal. No stridor. No respiratory distress. He has no wheezes. He has no rales.  Abdominal: Soft. Bowel sounds are normal. He exhibits no distension and no mass. There is tenderness. There is no rebound and no guarding.  Mild periumbilical tenderness, no guarding or rebound, nontender to palpation in all other quadrants, negative Murphy sign, no  tenderness at McBurney's point, no CVA tenderness  Musculoskeletal: He exhibits no edema or deformity.  Neurological: He is alert. Coordination normal.  Skin: Skin is warm and dry. Capillary refill takes less than 2 seconds. He is not diaphoretic.  Psychiatric: He has a normal mood and affect. His behavior is normal.  Nursing note and vitals reviewed.    ED Treatments / Results  Labs (all labs ordered are listed, but only abnormal results are displayed) Labs Reviewed  COMPREHENSIVE METABOLIC PANEL - Abnormal; Notable for the following components:      Result Value   Glucose, Bld 173 (*)    ALT 15 (*)    All other components within normal limits  LIPASE, BLOOD  CBC  URINALYSIS, ROUTINE W REFLEX MICROSCOPIC    EKG  EKG Interpretation None       Radiology No results found.  Procedures Procedures (including critical care time)  Medications Ordered in ED Medications - No data to display   Initial Impression /  Assessment and Plan / ED Course  I have reviewed the triage vital signs and the nursing notes.  Pertinent labs & imaging results that were available during my care of the patient were reviewed by me and considered in my medical decision making (see chart for details).  Presents with 6 months of intermittent periumbilical abdominal pain and diarrhea, no associated nausea, vomiting fevers or chills.  Patient has not tried anything outpatient to treat his symptoms, has not followed up with GI or his PCP.  On evaluation today patient is mildly tachycardic, on review of previous vitals this appears to be his baseline, vitals otherwise unremarkable.  On exam patient has mild periumbilical tenderness without any guarding, nontender in all other quadrants, no peritoneal signs.  Patient currently denying abdominal pain, has not had any episodes of diarrhea today.   Laboratory evaluation is unremarkable, no leukocytosis, kidney and liver function is normal in the liver function  is normal, no electrolyte abnormalities requiring intervention, normal lipase, UA without any signs of infection, do not feel that imaging would add to patient's workup at this time as this pain is chronic in nature.  Vision is stable for discharge home, Bentyl and probiotics prescribed for abdominal pain and diarrhea.  Patient to follow-up with PCP, [facility].  Strict return precautions discussed.  Patient expresses understanding and is in agreement with plan.  Patient discussed with Dr. Deretha EmoryZackowski, who saw patient as well and agrees with plan.   Final Clinical Impressions(s) / ED Diagnoses   Final diagnoses:  Periumbilical abdominal pain  Diarrhea, unspecified type    ED Discharge Orders        Ordered    dicyclomine (BENTYL) 20 MG tablet  2 times daily     05/13/17 2154    Lactobacillus-Inulin (CULTURELLE DIGESTIVE HEALTH) CAPS  Daily     05/13/17 2154       Dartha LodgeFord, Tabrina Esty N, PA-C 05/14/17 0115    Vanetta MuldersZackowski, Scott, MD 05/16/17 [phone]

## 2017-08-10 ENCOUNTER — Encounter: Payer: Self-pay | Admitting: Podiatry

## 2017-08-10 ENCOUNTER — Ambulatory Visit: Payer: Medicaid Other | Admitting: Podiatry

## 2017-08-10 DIAGNOSIS — M79675 Pain in left toe(s): Secondary | ICD-10-CM | POA: Diagnosis not present

## 2017-08-10 DIAGNOSIS — B351 Tinea unguium: Secondary | ICD-10-CM

## 2017-08-10 DIAGNOSIS — M79674 Pain in right toe(s): Secondary | ICD-10-CM

## 2017-08-10 DIAGNOSIS — E119 Type 2 diabetes mellitus without complications: Secondary | ICD-10-CM

## 2017-08-10 NOTE — Progress Notes (Signed)
Complaint:  Visit Type: Patient returns to my office for continued preventative foot care services. Complaint: Patient states" my nails have grown long and thick and become painful to walk and wear shoes" Patient has been diagnosed with DM with no foot complications. The patient presents for preventative foot care services. No changes to ROS  Podiatric Exam: Vascular: dorsalis pedis and posterior tibial pulses are palpable bilateral. Capillary return is immediate. Temperature gradient is WNL. Skin turgor WNL  Sensorium: Normal Semmes Weinstein monofilament test. Normal tactile sensation bilaterally. Nail Exam: Pt has thick disfigured discolored nails with subungual debris noted bilateral entire nail hallux through fifth toenails Ulcer Exam: There is no evidence of ulcer or pre-ulcerative changes or infection. Orthopedic Exam: Muscle tone and strength are WNL. No limitations in general ROM. No crepitus or effusions noted. Foot type and digits show no abnormalities. Bony prominences are unremarkable. Skin: No Porokeratosis. No infection or ulcers  Diagnosis:  Onychomycosis, , Pain in right toe, pain in left toes  Treatment & Plan Procedures and Treatment: Consent by patient was obtained for treatment procedures.   Debridement of mycotic and hypertrophic toenails, 1 through 5 bilateral and clearing of subungual debris. No ulceration, no infection noted.  Return Visit-Office Procedure: Patient instructed to return to the office for a follow up visit 3 months for continued evaluation and treatment.    Rian Busche DPM 

## 2017-11-09 ENCOUNTER — Ambulatory Visit: Payer: Medicaid Other | Admitting: Podiatry

## 2017-11-29 ENCOUNTER — Ambulatory Visit: Payer: Medicaid Other | Admitting: Podiatry

## 2017-12-07 ENCOUNTER — Encounter: Payer: Self-pay | Admitting: Podiatry

## 2017-12-07 ENCOUNTER — Ambulatory Visit: Payer: Medicaid Other | Admitting: Podiatry

## 2017-12-07 DIAGNOSIS — M79674 Pain in right toe(s): Secondary | ICD-10-CM

## 2017-12-07 DIAGNOSIS — B351 Tinea unguium: Secondary | ICD-10-CM | POA: Diagnosis not present

## 2017-12-07 DIAGNOSIS — M79675 Pain in left toe(s): Secondary | ICD-10-CM

## 2017-12-07 DIAGNOSIS — E119 Type 2 diabetes mellitus without complications: Secondary | ICD-10-CM

## 2017-12-07 NOTE — Progress Notes (Signed)
Complaint:  Visit Type: Patient returns to my office for continued preventative foot care services. Complaint: Patient states" my nails have grown long and thick and become painful to walk and wear shoes" Patient has been diagnosed with DM with no foot complications. The patient presents for preventative foot care services. No changes to ROS  Podiatric Exam: Vascular: dorsalis pedis and posterior tibial pulses are palpable bilateral. Capillary return is immediate. Temperature gradient is WNL. Skin turgor WNL  Sensorium: Normal Semmes Weinstein monofilament test. Normal tactile sensation bilaterally. Nail Exam: Pt has thick disfigured discolored nails with subungual debris noted bilateral entire nail hallux through fifth toenails Ulcer Exam: There is no evidence of ulcer or pre-ulcerative changes or infection. Orthopedic Exam: Muscle tone and strength are WNL. No limitations in general ROM. No crepitus or effusions noted. Foot type and digits show no abnormalities. Bony prominences are unremarkable. Skin: No Porokeratosis. No infection or ulcers  Diagnosis:  Onychomycosis, , Pain in right toe, pain in left toes  Treatment & Plan Procedures and Treatment: Consent by patient was obtained for treatment procedures.   Debridement of mycotic and hypertrophic toenails, 1 through 5 bilateral and clearing of subungual debris. No ulceration, no infection noted.  Return Visit-Office Procedure: Patient instructed to return to the office for a follow up visit 3 months for continued evaluation and treatment.    Anola Mcgough DPM 

## 2018-03-15 ENCOUNTER — Ambulatory Visit: Payer: Medicaid Other | Admitting: Podiatry

## 2018-03-17 ENCOUNTER — Encounter: Payer: Self-pay | Admitting: Podiatry

## 2018-03-17 ENCOUNTER — Ambulatory Visit: Payer: Medicaid Other | Admitting: Podiatry

## 2018-03-17 DIAGNOSIS — E119 Type 2 diabetes mellitus without complications: Secondary | ICD-10-CM

## 2018-03-17 DIAGNOSIS — B351 Tinea unguium: Secondary | ICD-10-CM

## 2018-03-17 DIAGNOSIS — M79674 Pain in right toe(s): Secondary | ICD-10-CM

## 2018-03-17 DIAGNOSIS — M79675 Pain in left toe(s): Secondary | ICD-10-CM

## 2018-03-17 NOTE — Progress Notes (Addendum)
Complaint:  Visit Type: Patient returns to my office for continued preventative foot care services. Complaint: Patient states" my nails have grown long and thick and become painful to walk and wear shoes" Patient has been diagnosed with DM with no foot complications. The patient presents for preventative foot care services. No changes to ROS  Podiatric Exam: Vascular: dorsalis pedis and posterior tibial pulses are palpable bilateral. Capillary return is immediate. Temperature gradient is WNL. Skin turgor WNL  Sensorium: Normal Semmes Weinstein monofilament test. Normal tactile sensation bilaterally. Nail Exam: Pt has thick disfigured discolored nails with subungual debris noted bilateral entire nail hallux through fifth toenails Ulcer Exam: There is no evidence of ulcer or pre-ulcerative changes or infection. Orthopedic Exam: Muscle tone and strength are WNL. No limitations in general ROM. No crepitus or effusions noted. Foot type and digits show no abnormalities. Bony prominences are unremarkable. Skin: No Porokeratosis. No infection or ulcers  Diagnosis:  Onychomycosis, , Pain in right toe, pain in left toes  Treatment & Plan Procedures and Treatment: Consent by patient was obtained for treatment procedures.   Debridement of mycotic and hypertrophic toenails, 1 through 5 bilateral and clearing of subungual debris. No ulceration, no infection noted. ABN signed for 2019. Return Visit-Office Procedure: Patient instructed to return to the office for a follow up visit 4 months for continued evaluation and treatment.    Helane Gunther DPM

## 2019-01-09 ENCOUNTER — Ambulatory Visit: Payer: Medicaid Other | Admitting: Podiatry

## 2019-02-21 ENCOUNTER — Encounter: Payer: Self-pay | Admitting: Podiatry

## 2019-02-21 ENCOUNTER — Ambulatory Visit (INDEPENDENT_AMBULATORY_CARE_PROVIDER_SITE_OTHER): Payer: Medicaid Other | Admitting: Podiatry

## 2019-02-21 ENCOUNTER — Other Ambulatory Visit: Payer: Self-pay

## 2019-02-21 DIAGNOSIS — E119 Type 2 diabetes mellitus without complications: Secondary | ICD-10-CM

## 2019-02-21 DIAGNOSIS — B351 Tinea unguium: Secondary | ICD-10-CM

## 2019-02-21 DIAGNOSIS — M79675 Pain in left toe(s): Secondary | ICD-10-CM | POA: Diagnosis not present

## 2019-02-21 DIAGNOSIS — M79674 Pain in right toe(s): Secondary | ICD-10-CM

## 2019-02-21 NOTE — Progress Notes (Signed)
Complaint:  Visit Type: Patient returns to my office for continued preventative foot care services. Complaint: Patient states" my nails have grown long and thick and become painful to walk and wear shoes" Patient has been diagnosed with DM with no foot complications. The patient presents for preventative foot care services. No changes to ROS  Podiatric Exam: Vascular: dorsalis pedis and posterior tibial pulses are palpable bilateral. Capillary return is immediate. Temperature gradient is WNL. Skin turgor WNL  Sensorium: Normal Semmes Weinstein monofilament test. Normal tactile sensation bilaterally. Nail Exam: Pt has thick disfigured discolored nails with subungual debris noted great toenail right and fifth toenail left. Ulcer Exam: There is no evidence of ulcer or pre-ulcerative changes or infection. Orthopedic Exam: Muscle tone and strength are WNL. No limitations in general ROM. No crepitus or effusions noted. Foot type and digits show no abnormalities. Bony prominences are unremarkable. Skin: No Porokeratosis. No infection or ulcers  Diagnosis:  Onychomycosis, , Pain in right toe, pain in left toes  Treatment & Plan Procedures and Treatment: Consent by patient was obtained for treatment procedures.   Debridement of mycotic and hypertrophic toenails, 1 through 5 bilateral and clearing of subungual debris. No ulceration, no infection noted.  Return Visit-Office Procedure: Patient instructed to return to the office for a follow up visit 6 months for continued evaluation and treatment.    Gardiner Barefoot DPM

## 2019-05-22 ENCOUNTER — Ambulatory Visit: Payer: Medicaid Other | Admitting: Podiatry

## 2020-02-09 ENCOUNTER — Emergency Department (HOSPITAL_COMMUNITY): Payer: Medicaid Other

## 2020-02-09 ENCOUNTER — Inpatient Hospital Stay (HOSPITAL_COMMUNITY)
Admission: EM | Admit: 2020-02-09 | Discharge: 2020-02-12 | DRG: 071 | Disposition: A | Discharge status: Home / Self Care | Payer: Medicaid Other | Attending: Internal Medicine | Admitting: Internal Medicine

## 2020-02-09 ENCOUNTER — Encounter (HOSPITAL_COMMUNITY): Payer: Self-pay | Admitting: Emergency Medicine

## 2020-02-09 ENCOUNTER — Other Ambulatory Visit: Payer: Self-pay

## 2020-02-09 ENCOUNTER — Observation Stay (HOSPITAL_COMMUNITY): Payer: Medicaid Other

## 2020-02-09 DIAGNOSIS — G40909 Epilepsy, unspecified, not intractable, without status epilepticus: Secondary | ICD-10-CM | POA: Diagnosis not present

## 2020-02-09 DIAGNOSIS — F200 Paranoid schizophrenia: Secondary | ICD-10-CM | POA: Diagnosis present

## 2020-02-09 DIAGNOSIS — G9341 Metabolic encephalopathy: Principal | ICD-10-CM | POA: Diagnosis present

## 2020-02-09 DIAGNOSIS — E860 Dehydration: Secondary | ICD-10-CM | POA: Diagnosis present

## 2020-02-09 DIAGNOSIS — N179 Acute kidney failure, unspecified: Secondary | ICD-10-CM | POA: Diagnosis present

## 2020-02-09 DIAGNOSIS — G4733 Obstructive sleep apnea (adult) (pediatric): Secondary | ICD-10-CM | POA: Diagnosis present

## 2020-02-09 DIAGNOSIS — Z20822 Contact with and (suspected) exposure to covid-19: Secondary | ICD-10-CM | POA: Diagnosis present

## 2020-02-09 DIAGNOSIS — Z880 Allergy status to penicillin: Secondary | ICD-10-CM

## 2020-02-09 DIAGNOSIS — F329 Major depressive disorder, single episode, unspecified: Secondary | ICD-10-CM | POA: Diagnosis present

## 2020-02-09 DIAGNOSIS — Z794 Long term (current) use of insulin: Secondary | ICD-10-CM

## 2020-02-09 DIAGNOSIS — I959 Hypotension, unspecified: Secondary | ICD-10-CM | POA: Diagnosis present

## 2020-02-09 DIAGNOSIS — F17210 Nicotine dependence, cigarettes, uncomplicated: Secondary | ICD-10-CM | POA: Diagnosis present

## 2020-02-09 DIAGNOSIS — K219 Gastro-esophageal reflux disease without esophagitis: Secondary | ICD-10-CM | POA: Diagnosis present

## 2020-02-09 DIAGNOSIS — I10 Essential (primary) hypertension: Secondary | ICD-10-CM | POA: Diagnosis present

## 2020-02-09 DIAGNOSIS — Z888 Allergy status to other drugs, medicaments and biological substances status: Secondary | ICD-10-CM

## 2020-02-09 DIAGNOSIS — Z87442 Personal history of urinary calculi: Secondary | ICD-10-CM

## 2020-02-09 DIAGNOSIS — E119 Type 2 diabetes mellitus without complications: Secondary | ICD-10-CM | POA: Diagnosis present

## 2020-02-09 DIAGNOSIS — Z79899 Other long term (current) drug therapy: Secondary | ICD-10-CM

## 2020-02-09 DIAGNOSIS — E785 Hyperlipidemia, unspecified: Secondary | ICD-10-CM | POA: Diagnosis present

## 2020-02-09 DIAGNOSIS — E875 Hyperkalemia: Secondary | ICD-10-CM | POA: Diagnosis present

## 2020-02-09 DIAGNOSIS — R4182 Altered mental status, unspecified: Secondary | ICD-10-CM | POA: Diagnosis present

## 2020-02-09 LAB — RAPID URINE DRUG SCREEN, HOSP PERFORMED
Amphetamines: NOT DETECTED
Barbiturates: NOT DETECTED
Benzodiazepines: NOT DETECTED
Cocaine: NOT DETECTED
Opiates: NOT DETECTED
Tetrahydrocannabinol: NOT DETECTED

## 2020-02-09 LAB — URINALYSIS, ROUTINE W REFLEX MICROSCOPIC
Bacteria, UA: NONE SEEN
Bilirubin Urine: NEGATIVE
Glucose, UA: >=500 mg/dL — AB
Hgb urine dipstick: NEGATIVE
Ketones, ur: NEGATIVE mg/dL
Leukocytes,Ua: NEGATIVE
Nitrite: NEGATIVE
Protein, ur: NEGATIVE mg/dL
Specific Gravity, Urine: 1.006 (ref 1.005–1.030)
pH: 5 (ref 5.0–8.0)

## 2020-02-09 LAB — CBC WITH DIFFERENTIAL/PLATELET
Abs Immature Granulocytes: 0.04 10*3/uL (ref 0.00–0.07)
Basophils Absolute: 0 10*3/uL (ref 0.0–0.1)
Basophils Relative: 0 %
Eosinophils Absolute: 0 10*3/uL (ref 0.0–0.5)
Eosinophils Relative: 0 %
HCT: 42.6 % (ref 39.0–52.0)
Hemoglobin: 13 g/dL (ref 13.0–17.0)
Immature Granulocytes: 0 %
Lymphocytes Relative: 23 %
Lymphs Abs: 2.3 10*3/uL (ref 0.7–4.0)
MCH: 26.4 pg (ref 26.0–34.0)
MCHC: 30.5 g/dL (ref 30.0–36.0)
MCV: 86.6 fL (ref 80.0–100.0)
Monocytes Absolute: 1.1 10*3/uL — ABNORMAL HIGH (ref 0.1–1.0)
Monocytes Relative: 10 %
Neutro Abs: 6.9 10*3/uL (ref 1.7–7.7)
Neutrophils Relative %: 67 %
Platelets: 223 10*3/uL (ref 150–400)
RBC: 4.92 MIL/uL (ref 4.22–5.81)
RDW: 15.2 % (ref 11.5–15.5)
WBC: 10.4 10*3/uL (ref 4.0–10.5)
nRBC: 0 % (ref 0.0–0.2)

## 2020-02-09 LAB — I-STAT VENOUS BLOOD GAS, ED
Acid-base deficit: 4 mmol/L — ABNORMAL HIGH (ref 0.0–2.0)
Bicarbonate: 21.5 mmol/L (ref 20.0–28.0)
Calcium, Ion: 1.14 mmol/L — ABNORMAL LOW (ref 1.15–1.40)
HCT: 39 % (ref 39.0–52.0)
Hemoglobin: 13.3 g/dL (ref 13.0–17.0)
O2 Saturation: 80 %
Potassium: 5.2 mmol/L — ABNORMAL HIGH (ref 3.5–5.1)
Sodium: 140 mmol/L (ref 135–145)
TCO2: 23 mmol/L (ref 22–32)
pCO2, Ven: 41.1 mmHg — ABNORMAL LOW (ref 44.0–60.0)
pH, Ven: 7.327 (ref 7.250–7.430)
pO2, Ven: 48 mmHg — ABNORMAL HIGH (ref 32.0–45.0)

## 2020-02-09 LAB — CBC
HCT: 29.4 % — ABNORMAL LOW (ref 39.0–52.0)
Hemoglobin: 8.8 g/dL — ABNORMAL LOW (ref 13.0–17.0)
MCH: 26.9 pg (ref 26.0–34.0)
MCHC: 29.9 g/dL — ABNORMAL LOW (ref 30.0–36.0)
MCV: 89.9 fL (ref 80.0–100.0)
Platelets: 109 10*3/uL — ABNORMAL LOW (ref 150–400)
RBC: 3.27 MIL/uL — ABNORMAL LOW (ref 4.22–5.81)
RDW: 15.3 % (ref 11.5–15.5)
WBC: 6.6 10*3/uL (ref 4.0–10.5)
nRBC: 0.3 % — ABNORMAL HIGH (ref 0.0–0.2)

## 2020-02-09 LAB — BASIC METABOLIC PANEL
Anion gap: 8 (ref 5–15)
BUN: 21 mg/dL — ABNORMAL HIGH (ref 6–20)
CO2: 10 mmol/L — ABNORMAL LOW (ref 22–32)
Calcium: 5.3 mg/dL — CL (ref 8.9–10.3)
Chloride: 123 mmol/L — ABNORMAL HIGH (ref 98–111)
Creatinine, Ser: 0.82 mg/dL (ref 0.61–1.24)
GFR calc Af Amer: >60 mL/min (ref >60)
GFR calc non Af Amer: >60 mL/min (ref >60)
Glucose, Bld: 86 mg/dL (ref 70–99)
Potassium: 3 mmol/L — ABNORMAL LOW (ref 3.5–5.1)
Sodium: 141 mmol/L (ref 135–145)

## 2020-02-09 LAB — ETHANOL: Alcohol, Ethyl (B): <10 mg/dL (ref <10)

## 2020-02-09 LAB — CBG MONITORING, ED: Glucose-Capillary: 123 mg/dL — ABNORMAL HIGH (ref 70–99)

## 2020-02-09 LAB — SALICYLATE LEVEL: Salicylate Lvl: <7 mg/dL — ABNORMAL LOW (ref 7.0–30.0)

## 2020-02-09 LAB — VALPROIC ACID LEVEL: Valproic Acid Lvl: 49 ug/mL — ABNORMAL LOW (ref 50.0–100.0)

## 2020-02-09 LAB — LACTIC ACID, PLASMA
Lactic Acid, Venous: 1.8 mmol/L (ref 0.5–1.9)
Lactic Acid, Venous: 2.5 mmol/L (ref 0.5–1.9)

## 2020-02-09 LAB — SARS CORONAVIRUS 2 BY RT PCR (HOSPITAL ORDER, PERFORMED IN ~~LOC~~ HOSPITAL LAB): SARS Coronavirus 2: NEGATIVE

## 2020-02-09 LAB — PHENYTOIN LEVEL, TOTAL: Phenytoin Lvl: <2.5 ug/mL — ABNORMAL LOW (ref 10.0–20.0)

## 2020-02-09 LAB — ACETAMINOPHEN LEVEL: Acetaminophen (Tylenol), Serum: <10 ug/mL — ABNORMAL LOW (ref 10–30)

## 2020-02-09 LAB — GLUCOSE, CAPILLARY
Glucose-Capillary: 68 mg/dL — ABNORMAL LOW (ref 70–99)
Glucose-Capillary: 76 mg/dL (ref 70–99)

## 2020-02-09 LAB — AMMONIA: Ammonia: 19 umol/L (ref 9–35)

## 2020-02-09 MED ORDER — LEVETIRACETAM IN NACL 1500 MG/100ML IV SOLN
1500.0000 mg | Freq: Once | INTRAVENOUS | Status: AC
  Administered 2020-02-09: 1500 mg via INTRAVENOUS
  Filled 2020-02-09: qty 100

## 2020-02-09 MED ORDER — SODIUM CHLORIDE 0.9 % IV BOLUS
1000.0000 mL | Freq: Once | INTRAVENOUS | Status: AC
  Administered 2020-02-09: 1000 mL via INTRAVENOUS

## 2020-02-09 MED ORDER — LISINOPRIL 20 MG PO TABS
20.0000 mg | ORAL_TABLET | Freq: Every day | ORAL | Status: DC
  Administered 2020-02-09 – 2020-02-10 (×2): 20 mg via ORAL
  Filled 2020-02-09 (×2): qty 1

## 2020-02-09 MED ORDER — ENOXAPARIN SODIUM 40 MG/0.4ML ~~LOC~~ SOLN
40.0000 mg | SUBCUTANEOUS | Status: DC
  Administered 2020-02-10 – 2020-02-11 (×3): 40 mg via SUBCUTANEOUS
  Filled 2020-02-09 (×4): qty 0.4

## 2020-02-09 MED ORDER — ONDANSETRON HCL 4 MG/2ML IJ SOLN: 4.0000 mg | Freq: Four times a day (QID) | INTRAMUSCULAR | Status: DC | PRN

## 2020-02-09 MED ORDER — SODIUM CHLORIDE 0.9% FLUSH: 3.0000 mL | Freq: Once | INTRAVENOUS | Status: DC

## 2020-02-09 MED ORDER — CLOZAPINE 25 MG PO TABS
150.0000 mg | ORAL_TABLET | Freq: Every day | ORAL | Status: DC
  Administered 2020-02-10 – 2020-02-12 (×3): 150 mg via ORAL
  Filled 2020-02-09 (×3): qty 2

## 2020-02-09 MED ORDER — NALOXONE HCL 2 MG/2ML IJ SOSY
PREFILLED_SYRINGE | INTRAMUSCULAR | Status: AC
  Filled 2020-02-09: qty 2

## 2020-02-09 MED ORDER — QUETIAPINE FUMARATE 50 MG PO TABS
100.0000 mg | ORAL_TABLET | Freq: Every day | ORAL | Status: DC
  Administered 2020-02-10 – 2020-02-11 (×2): 100 mg via ORAL
  Filled 2020-02-09 (×2): qty 2
  Filled 2020-02-09: qty 1

## 2020-02-09 MED ORDER — SENNOSIDES-DOCUSATE SODIUM 8.6-50 MG PO TABS: 1.0000 | ORAL_TABLET | Freq: Every evening | ORAL | Status: DC | PRN

## 2020-02-09 MED ORDER — LEVETIRACETAM IN NACL 500 MG/100ML IV SOLN
500.0000 mg | Freq: Two times a day (BID) | INTRAVENOUS | Status: DC
  Administered 2020-02-10: 500 mg via INTRAVENOUS
  Filled 2020-02-09: qty 100

## 2020-02-09 MED ORDER — NICOTINE 14 MG/24HR TD PT24
14.0000 mg | MEDICATED_PATCH | Freq: Every day | TRANSDERMAL | Status: DC
  Administered 2020-02-10 – 2020-02-12 (×3): 14 mg via TRANSDERMAL
  Filled 2020-02-09 (×3): qty 1

## 2020-02-09 MED ORDER — DIVALPROEX SODIUM ER 500 MG PO TB24
1250.0000 mg | ORAL_TABLET | Freq: Every day | ORAL | Status: DC
  Administered 2020-02-10: 1250 mg via ORAL
  Filled 2020-02-09 (×2): qty 1

## 2020-02-09 MED ORDER — ACETAMINOPHEN 325 MG PO TABS: 650.0000 mg | ORAL_TABLET | Freq: Four times a day (QID) | ORAL | Status: DC | PRN

## 2020-02-09 MED ORDER — IPRATROPIUM-ALBUTEROL 0.5-2.5 (3) MG/3ML IN SOLN: 3.0000 mL | Freq: Four times a day (QID) | RESPIRATORY_TRACT | Status: DC | PRN

## 2020-02-09 MED ORDER — INSULIN ASPART 100 UNIT/ML ~~LOC~~ SOLN: 0.0000 [IU] | Freq: Every day | SUBCUTANEOUS | Status: DC

## 2020-02-09 MED ORDER — METFORMIN HCL 500 MG PO TABS
1000.0000 mg | ORAL_TABLET | Freq: Two times a day (BID) | ORAL | Status: DC
  Administered 2020-02-10: 1000 mg via ORAL
  Filled 2020-02-09: qty 2

## 2020-02-09 MED ORDER — QUETIAPINE FUMARATE 50 MG PO TABS
50.0000 mg | ORAL_TABLET | ORAL | Status: DC
  Administered 2020-02-10 – 2020-02-12 (×3): 50 mg via ORAL
  Filled 2020-02-09 (×4): qty 1

## 2020-02-09 MED ORDER — LACTATED RINGERS IV SOLN: INTRAVENOUS | Status: DC

## 2020-02-09 MED ORDER — DAPAGLIFLOZIN PROPANEDIOL 10 MG PO TABS
10.0000 mg | ORAL_TABLET | Freq: Every day | ORAL | Status: DC
  Administered 2020-02-10: 10 mg via ORAL
  Filled 2020-02-09: qty 1

## 2020-02-09 MED ORDER — ONDANSETRON HCL 4 MG PO TABS: 4.0000 mg | ORAL_TABLET | Freq: Four times a day (QID) | ORAL | Status: DC | PRN

## 2020-02-09 MED ORDER — INSULIN ASPART 100 UNIT/ML ~~LOC~~ SOLN
0.0000 [IU] | Freq: Three times a day (TID) | SUBCUTANEOUS | Status: DC
  Administered 2020-02-12: 2 [IU] via SUBCUTANEOUS

## 2020-02-09 MED ORDER — INSULIN GLARGINE 100 UNIT/ML ~~LOC~~ SOLN
20.0000 [IU] | Freq: Every day | SUBCUTANEOUS | Status: DC
  Administered 2020-02-10 – 2020-02-11 (×2): 20 [IU] via SUBCUTANEOUS
  Filled 2020-02-09 (×4): qty 0.2

## 2020-02-09 MED ORDER — ASPIRIN EC 81 MG PO TBEC
81.0000 mg | DELAYED_RELEASE_TABLET | Freq: Every day | ORAL | Status: DC
  Administered 2020-02-09 – 2020-02-12 (×4): 81 mg via ORAL
  Filled 2020-02-09 (×4): qty 1

## 2020-02-09 MED ORDER — ACETAMINOPHEN 650 MG RE SUPP: 650.0000 mg | Freq: Four times a day (QID) | RECTAL | Status: DC | PRN

## 2020-02-09 NOTE — TOC Progression Note (Signed)
Transition of Care [facility]) - Progression Note    Patient Details  Name: Ronald Roman MRN: [id number] Date of Birth: 1961/04/18  Transition of Care [facility]) CM/SW Contact  Lockie Pares, RN Phone Number: 02/09/2020, 3:45 PM  Clinical Narrative:    Peggye Ley, Coordinator at [facility] returned my call. Nanette. She indicated that she was his case manager for 4 years but that was some time ago. She thought he was with [facility] now. She stated that if we cannot find anyone that she would be happy to assist. # [phone]        Expected Discharge Plan and Services                                                 Social Determinants of Health (SDOH) Interventions    Readmission Risk Interventions No flowsheet data found.

## 2020-02-09 NOTE — H&P (Signed)
History and Physical    Ronald Roman LNL:[id number] DOB: 05-05-1961 DOA: 02/09/2020  PCP: Salli Real, MD   Patient coming from:   Group home  Chief Complaint: Altered mental status, possible seizure  HPI: Ronald Roman is a 59 y.o. male with medical history significant for seizure disorder, diabetes mellitus type 2, hypertension, hyperlipidemia, paranoid schizophrenia, OSA.  He arrived by EMS from a group home.  Reportedly he was in stable eating a bowl of rice when he suddenly slumped over and was unresponsive.  Initially arrived to the emergency room he was drowsy, confused but would respond to painful stimuli.  He was given multiple doses of Narcan without improvement in mentation.  There is no known history of opiate abuse or use.  Social work has been involved with the patient and apparently he has been residing in a group home that is unlicensed.  When they tried to call the group home they did not receive any answer and is unclear what medications he was actually taking.  The pharmacies are listed in his chart that he uses are closed for the weekend.  He does have a history of seizure disorder but there was no reported convulsions or seizure activity by EMS and there is no report of bowel or bladder incontinence.  Patient is now awake but is a poor historian and has slow speech and seems to struggle with answering questions.  He can follow simple commands such as gripping and sticking out his tongue.  Is unclear what his baseline mentation level is.  He reports that he does have a history of seizures but is not sure when his last seizure was.  He states he does take medications but is not sure what they are.  He states he is not sure why he was brought to the hospital and has no memory of any events from today. He smokes 1 pack of cigarettes a day.  He denies alcohol or illicit drug use.  ED Course:   CT of the head and neck without contrast on negative.  Labs were unremarkable except for  mild increase in BUN and creatinine.  He was given IV fluids in the emergency room.  Social work has been involved with his case and report to [facility] [employer] has been made.  ER provider did discuss case with neurology who recommended patient get a bolus of Keppra and neurology will follow up with patient tomorrow.  Requested an MRI of the brain to be ordered which is pending.  Review of Systems: Accurate review of system is difficult to obtain as patient is poor historian. General: Denies weakness, fever, chills.  Denies dizziness.   HENT: Denies head trauma, headache, denies change in hearing.  Denies nasal congestion or bleeding.  Denies sore throat.  Denies difficulty swallowing Eyes: Denies blurry vision, pain in eye, drainage.  Neck: Denies pain.  Denies swelling.  Denies pain with movement. Cardiovascular: Denies chest pain, palpitations.  Denies edema. Respiratory: Denies shortness of breath, cough. Denies sputum production Gastrointestinal: Denies abdominal pain.  Denies nausea, vomiting, diarrhea.  Musculoskeletal: Denies pain.  Denies arthralgias or myalgias. Genitourinary: Denies urinary frequency.  Denies dysuria.  Skin: Denies rash.  Denies petechiae, purpura, ecchymosis. Neurological: Denies headache. Denies paresthesia. Denies visual change. Psychiatric: Denies suicidal thoughts.  Denies hallucinations.  Past Medical History:  Diagnosis Date  . Diabetes mellitus without complication (HCC)   . GERD (gastroesophageal reflux disease)   . Hyperlipidemia   . Hypertension   .  Paranoid schizophrenia (HCC)   . Sleep apnea   . Uric acid nephrolithiasis     History reviewed. No pertinent surgical history.  Social History  reports that he has been smoking cigarettes. He has been smoking about 1.00 pack per day. He has never used smokeless tobacco. He reports that he does not drink alcohol and does not use drugs.  Allergies  Allergen Reactions  . Cogentin [Benztropine]  Other (See Comments)    Per MAR   . Fluphenazine Hcl   . Penicillins Other (See Comments)    Per MAR - unable to verify patients PCN reaction   . Prolixin [Fluphenazine] Other (See Comments)    Per MAR     History reviewed. No pertinent family history.   Prior to Admission medications   Medication Sig Start Date End Date Taking? Authorizing Provider  albuterol (PROVENTIL HFA;VENTOLIN HFA) 108 (90 BASE) MCG/ACT inhaler Inhale 2 puffs into the lungs every 4 (four) hours as needed for wheezing or shortness of breath. 04/21/15   Hayden Rasmussen, NP  amantadine (SYMMETREL) 100 MG capsule Take 1 capsule (100 mg total) by mouth 2 (two) times daily. 04/26/16   Oneta Rack, NP  cloZAPine (CLOZARIL) 100 MG tablet Take 150 mg by mouth daily.     [provider]  dicyclomine (BENTYL) 20 MG tablet Take 1 tablet (20 mg total) by mouth 2 (two) times daily. 05/13/17   Dartha Lodge, PA-C  divalproex (DEPAKOTE ER) 250 MG 24 hr tablet Take 5 tablets (1,250 mg total) by mouth at bedtime. 04/26/16   Oneta Rack, NP  insulin glargine (LANTUS) 100 UNIT/ML injection Inject 0.2 mLs (20 Units total) into the skin at bedtime. Patient taking differently: Inject 25 Units into the skin at bedtime.  04/26/16   Oneta Rack, NP  Lactobacillus-Inulin (CULTURELLE DIGESTIVE HEALTH) CAPS Take 1 capsule by mouth daily. 05/13/17   Dartha Lodge, PA-C  lisinopril (PRINIVIL,ZESTRIL) 20 MG tablet Take 1 tablet (20 mg total) by mouth daily. 04/27/16   Oneta Rack, NP  metFORMIN (GLUCOPHAGE) 1000 MG tablet Take 1,000 mg by mouth 2 (two) times daily with a meal.    [provider]  QUEtiapine (SEROQUEL) 100 MG tablet Take 100 mg by mouth at bedtime.    [provider]  QUEtiapine (SEROQUEL) 50 MG tablet Take 50 mg by mouth every morning.     [provider]  zolpidem (AMBIEN) 10 MG tablet Take 10 mg by mouth at bedtime.     [provider]    Physical Exam: Vitals:    02/09/20 1830 02/09/20 1845 02/09/20 1900 02/09/20 1915  BP: 112/78 121/80 116/83 (!) 125/96  Pulse: 65 64 61 74  Resp: 14 13 14 17   Temp:      TempSrc:      SpO2: 100% 100% 100% 100%    Constitutional: NAD, calm, comfortable Vitals:   02/09/20 1830 02/09/20 1845 02/09/20 1900 02/09/20 1915  BP: 112/78 121/80 116/83 (!) 125/96  Pulse: 65 64 61 74  Resp: 14 13 14 17   Temp:      TempSrc:      SpO2: 100% 100% 100% 100%   General: WDWN, Alert and oriented x3.  Eyes: EOMI, PERRL, lids and conjunctivae normal.  Sclera nonicteric.  No nystagmus HENT:  [place]/AT, external ears normal.  Nares patent without epistasis.  Mucous membranes are moist. Posterior pharynx clear of any exudate or lesions. Poor dentition. Neck: Soft, normal range of motion, supple, no  masses, no thyromegaly.  Trachea midline Respiratory: Equal breath sounds with mild diffuse rales.  No wheezing, no crackles. Normal respiratory effort. No accessory muscle use.  Cardiovascular: Regular rate and rhythm, no murmurs / rubs / gallops. No extremity edema. 1+ pedal pulses. No carotid bruits.  Abdomen: Soft, no tenderness, nondistended, no rebound or guarding.  Obese.  No masses palpated. Bowel sounds normoactive Musculoskeletal: FROM. no clubbing / cyanosis. No joint deformity upper and lower extremities. no contractures. Normal muscle tone.  Skin: Warm, dry, intact no rashes, lesions, ulcers. No induration Neurologic: CN 2-12 grossly intact. Slow speech.  Sensation intact to touch.  Patient withdraws from painful stimuli., patella DTR +1 bilaterally. Strength 4/5 in all extremities.  Babinski downgoing bilaterally.  No pronator drift. Psychiatric: Normal judgment and insight.  Normal mood.    Labs on Admission: I have personally reviewed following labs and imaging studies  CBC: Recent Labs  Lab 02/09/20 1421 02/09/20 1459 02/09/20 1620  WBC 6.6  --  10.4  NEUTROABS  --   --  6.9  HGB 8.8* 13.3 13.0  HCT 29.4* 39.0 42.6    MCV 89.9  --  86.6  PLT 109*  --  223    Basic Metabolic Panel: Recent Labs  Lab 02/09/20 1421 02/09/20 1459 02/09/20 1620  NA 141 140 139  K 3.0* 5.2* 4.6  CL 123*  --  111  CO2 10*  --  20*  GLUCOSE 86  --  100*  BUN 21*  --  28*  CREATININE 0.82  --  1.28*  CALCIUM 5.3*  --  8.9    GFR: CrCl cannot be calculated (Unknown ideal weight.).  Liver Function Tests: Recent Labs  Lab 02/09/20 1620  AST 16  ALT 11  ALKPHOS 59  BILITOT 0.5  PROT 5.9*  ALBUMIN 3.0*    Urine analysis:    Component Value Date/Time   COLORURINE YELLOW 05/13/2017 2104   APPEARANCEUR CLEAR 05/13/2017 2104   LABSPEC 1.014 05/13/2017 2104   PHURINE 5.0 05/13/2017 2104   GLUCOSEU NEGATIVE 05/13/2017 2104   HGBUR NEGATIVE 05/13/2017 2104   BILIRUBINUR NEGATIVE 05/13/2017 2104   KETONESUR NEGATIVE 05/13/2017 2104   PROTEINUR NEGATIVE 05/13/2017 2104   UROBILINOGEN 0.2 02/04/2015 0059   NITRITE NEGATIVE 05/13/2017 2104   LEUKOCYTESUR NEGATIVE 05/13/2017 2104    Radiological Exams on Admission: CT Head Wo Contrast  Result Date: 02/09/2020 CLINICAL DATA:  Change in mental status EXAM: CT HEAD WITHOUT CONTRAST TECHNIQUE: Contiguous axial images were obtained from the base of the skull through the vertex without intravenous contrast. COMPARISON:  None. FINDINGS: Brain: No evidence of acute territorial infarction, hemorrhage, hydrocephalus,extra-axial collection or mass lesion/mass effect. There is dilatation the ventricles and sulci consistent with age-related atrophy. Low-attenuation changes in the deep white matter consistent with small vessel ischemia. Vascular: No hyperdense vessel or unexpected calcification. Skull: The skull is intact. No fracture or focal lesion identified. Sinuses/Orbits: The visualized paranasal sinuses and mastoid air cells are clear. The orbits and globes intact. Other: None Cervical spine: Alignment: There is straightening of the normal cervical lordosis. Skull base and  vertebrae: Visualized skull base is intact. No atlanto-occipital dissociation. The vertebral body heights are well maintained. No fracture or pathologic osseous lesion seen. Soft tissues and spinal canal: The visualized paraspinal soft tissues are unremarkable. No prevertebral soft tissue swelling is seen. The spinal canal is grossly unremarkable, no large epidural collection or significant canal narrowing. Disc levels: Mild disc height loss with anterior osteophytes is  most notable at C6-C7 mild bilateral neural foraminal narrowing. Upper chest: The lung apices are clear. Thoracic inlet is within normal limits. Other: None IMPRESSION: No acute intracranial abnormality. Findings consistent with age related atrophy and chronic small vessel ischemia No acute fracture or malalignment of the spine. Electronically Signed   By: Jonna Clark M.D.   On: 02/09/2020 15:39   CT Cervical Spine Wo Contrast  Result Date: 02/09/2020 CLINICAL DATA:  Change in mental status EXAM: CT HEAD WITHOUT CONTRAST TECHNIQUE: Contiguous axial images were obtained from the base of the skull through the vertex without intravenous contrast. COMPARISON:  None. FINDINGS: Brain: No evidence of acute territorial infarction, hemorrhage, hydrocephalus,extra-axial collection or mass lesion/mass effect. There is dilatation the ventricles and sulci consistent with age-related atrophy. Low-attenuation changes in the deep white matter consistent with small vessel ischemia. Vascular: No hyperdense vessel or unexpected calcification. Skull: The skull is intact. No fracture or focal lesion identified. Sinuses/Orbits: The visualized paranasal sinuses and mastoid air cells are clear. The orbits and globes intact. Other: None Cervical spine: Alignment: There is straightening of the normal cervical lordosis. Skull base and vertebrae: Visualized skull base is intact. No atlanto-occipital dissociation. The vertebral body heights are well maintained. No fracture  or pathologic osseous lesion seen. Soft tissues and spinal canal: The visualized paraspinal soft tissues are unremarkable. No prevertebral soft tissue swelling is seen. The spinal canal is grossly unremarkable, no large epidural collection or significant canal narrowing. Disc levels: Mild disc height loss with anterior osteophytes is most notable at C6-C7 mild bilateral neural foraminal narrowing. Upper chest: The lung apices are clear. Thoracic inlet is within normal limits. Other: None IMPRESSION: No acute intracranial abnormality. Findings consistent with age related atrophy and chronic small vessel ischemia No acute fracture or malalignment of the spine. Electronically Signed   By: Jonna Clark M.D.   On: 02/09/2020 15:39   DG Chest Portable 1 View  Result Date: 02/09/2020 CLINICAL DATA:  Altered mental status EXAM: PORTABLE CHEST 1 VIEW COMPARISON:  05/30/2016 FINDINGS: The heart size and mediastinal contours are within normal limits. Both lungs are clear. The visualized skeletal structures are unremarkable. IMPRESSION: No acute abnormality of the lungs in AP portable projection. Electronically Signed   By: Lauralyn Primes M.D.   On: 02/09/2020 15:08    EKG: Independently reviewed.  EKG is reviewed and shows normal sinus rhythm.  No acute ST elevation or depression.  QTc is 415  Assessment/Plan Principal Problem:   Seizure disorder [facility]) Ronald Roman is placed on medical surgical floor for observation.  He has history of seizure disorder and is unclear if he has been taking his medications as prescribed for his seizures.  Patient was given 1500 mg of Keppra IV in the emergency room after ER provider discussed case with neurology.  Will be continued on Keppra  every 12 hours.  MRI brain is ordered and pending. Obtain EEG in the morning Patient placed on seizure precautions  Active Problems:   Diabetes mellitus without complication (HCC) Continue basal insulin.  Continue Metformin twice a  day.  Sliding scale insulin provided as needed.  Check hemoglobin A1c.  Monitor blood sugars with meals and bedtime    Hypertension Continue home medication of lisinopril.  Monitor blood pressure    AKI (acute kidney injury) (HCC) Mild AKI on labs.  Patient was given 2 L of IV fluid bolus in the emergency room.  Will be continued on LR at 100 mils per hour overnight. Recheck electrolytes  renal function morning    Altered mental status Patient presented with confusion and decreased responsiveness that was not reversed with doses of Narcan.  Patient is now more alert but cannot provide history and is slow to respond.  Is unsure what his baseline level of mentation is.    Paranoid schizophrenia (HCC) Continue home dose of Seroquel on chart    DVT prophylaxis: Lovenox for DVT prophylaxis Code Status:   Full code Family Communication:  No family at bedside Disposition Plan:   Patient is from:  Group home  Anticipated DC to:              Patient will need placement and social work is following.  Report Adult Protective Services has been made  Anticipated DC date:  Anticipate discharge in 48 hours  Anticipated DC barriers: Discharge plan and location will be determined with assistance from social work and social services  Consults called:  Neurology was consulted by ER provider and they recommended starting Keppra and they will evaluate in morning Admission status:  Observation  Severity of Illness: The appropriate patient status for this patient is OBSERVATION. Observation status is judged to be reasonable and necessary in order to provide the required intensity of service to ensure the patient's safety. The patient's presenting symptoms, physical exam findings, and initial radiographic and laboratory data in the context of their medical condition is felt to place them at decreased risk for further clinical deterioration. Furthermore, it is anticipated that the patient will be medically  stable for discharge from the hospital within 2 midnights of admission. The following factors support the patient status of observation.      Claudean SeveranceBradley S Luetta Piazza MD [facility]  How to contact the [facility] Attending or Consulting provider 7A - 7P or covering provider during after hours 7P -7A, for this patient?   1. Check the care team in [facility] and look for a) attending/consulting TR[facility] provider listed and b) the [facility] team listed 2. Log into [email or web address] and use [place]'s universal password to access. If you do not have the password, please contact the hospital operator. 3. Locate the [facility] provider you are looking for under [facility] and page to a number that you can be directly reached. 4. If you still have difficulty reaching the provider, please page the [facility] (Director on Call) for the Hospitalists listed on amion for assistance.  02/09/2020, 7:58 PM

## 2020-02-09 NOTE — TOC Initial Note (Signed)
Transition of Care [facility]) - Initial/Assessment Note    Patient Details  Name: ARBOR COHEN MRN: [id number] Date of Birth: 1960-07-04  [facility] CM/SW Contact:    Lockie Pares, RN Phone Number: 02/09/2020, 3:06 PM  Clinical Narrative:                  Patient came in by [facility].from assisted living. His address indicated the now closed  [facility].The number listed for the patient is no longer in working order.  Called Nanette, his listed contact, left message to call back.        Patient Goals and CMS Choice        Expected Discharge Plan and Services                                                Prior Living Arrangements/Services                       Activities of Daily Living      Permission Sought/Granted                  Emotional Assessment              Admission diagnosis:  syncope Patient Active Problem List   Diagnosis Date Noted  . Acute psychosis (HCC)   . Leukocytosis 02/06/2015  . Sleep apnea 02/05/2015  . AKI (acute kidney injury) (HCC) 02/04/2015  . Seizure disorder (HCC) 02/04/2015  . Acute encephalopathy 02/04/2015  . Diabetes mellitus without complication (HCC)   . Hyperlipidemia   . Hypertension   . Paranoid schizophrenia (HCC)    PCP:  Salli Real, MD Pharmacy:   [facility] [place], [name] - [address] [name] [address] Phone: [phone] Fax: [phone]  [facility] - [place], [name] - [address] [name] [address] Phone: [phone] Fax: [phone]     Social Determinants of Health (SDOH) Interventions    Readmission Risk Interventions No flowsheet data found.

## 2020-02-09 NOTE — TOC Progression Note (Addendum)
Transition of Care [facility]) - Progression Note    Patient Details  Name: Ronald Roman MRN: [id number] Date of Birth: [birth year]  [facility]) CM/SW Contact  Annalee Genta, LCSW Phone Number: 02/09/2020, 4:03 PM  Clinical Narrative:  EDCSW contacted by [name] for assistance. Patient arrived from a "boarding home" or group home/family care home by EMS. Per listed address he was residing at [facility] which is no longer operating and phone number listed is same as patient's primary contact info and is no longer in service.   --MSW intern contacted [facility] and determined patient was picked up from [facility].  --EDCSW contacted [facility] and was informed that he receives [facility] in-reach coordination and last known address was [address] although they have made several attempts to reach patient unsuccessfully. If patient is admitted would likely benefit in getting reconnected with in-reach coordinator Lum Keas. Per [facility] patient's only known service provider is [employer] [facility]. Per [facility] no ACTT provider is involved and no legal guardian.   --If patient is admitted may need follow-up for guardianship, otherwise patient will necessitate an APS report to pursue this in the community if they take the case.    --MSW intern found information linking [facility] as owner of the property. EDCSW spoke with owner of [employer] who reported they own the property but the operator/licensee of property is [employer] [phone]. As it appears there is not a licensed facility at this location, although it may be a boarding home now and would not necessarily be licensed.   --EDCSW left voicemail with Caroline More [phone] and will update note as return call is made.   -EDCSW and MSW intern called Vesta Mixer, per [facility] last known provider, and last saw a provider in 03/09/2019 and no-showed in November and December.   Follow-up from [facility] standpoint will depend on  medical disposition.         Expected Discharge Plan and Services                                                 Social Determinants of Health (SDOH) Interventions    Readmission Risk Interventions No flowsheet data found.

## 2020-02-09 NOTE — ED Triage Notes (Signed)
Pt to triage via [facility] from boarding home.  Pt slumped over at table while eating rice.  BP 58/46 per EMS.  On arrival to triage pt has snoring respirations.  EMS reports pt was responsive to painful stimuli and was unable to sit in wheelchair.  18g R FA and EMS administered NS 500cc.  Last BP 108/72, HR 90, CBG 114.  Pt to treatment room.

## 2020-02-09 NOTE — Consult Note (Signed)
Requesting Physician: Dr. Rachael Darby    Chief Complaint: Sudden unresponsiveness  History obtained from: Patient and Chart    HPI:                                                                                                                                       Ronald Roman is a 59 y.o. male past medical history significant for diabetes mellitus, hypertension, hyperlipidemia, paranoid schizophrenia, possible seizures on Depakote presents to the ED with breakthrough seizures.  Patient lives in a group home and while having dinner sudden onset slumped over and was unresponsive.  Patient arrived to the ED is drowsy and given multiple doses of Narcan without much improvement.  No tongue bite or bladder incontinence noted. No witnessed tonic-clonic jerking movements. Patient on the emergency room slowly became more awake. Patient has a history of seizures and patient states that he takes Depakote and Dilantin, however only Depakote is in his list of medications.  Patient states he takes his medications however this is unclear as patient does not know his dose and does not appear to be reliable historian.  Work-up included CT head which is unremarkable.  Mildly increased BUN and creatinine.  UDS negative for illicit substances.  Depakote level 49, Dilantin undetectable.  Patient was given a bolus of Keppra.   Chart review Patient presented with altered mental status and concern for postictal encephalopathy.  EEG was performed which was normal.  Prior seizure work-up unknown however started on Depakote by Dr. Cyril Mourning in 2016.  A.  She has not followed up with a neurologist since then.   Past Medical History:  Diagnosis Date  . Diabetes mellitus without complication (HCC)   . GERD (gastroesophageal reflux disease)   . Hyperlipidemia   . Hypertension   . Paranoid schizophrenia (HCC)   . Sleep apnea   . Uric acid nephrolithiasis     History reviewed. No pertinent surgical history.  History  reviewed. No pertinent family history. Social History:  reports that he has been smoking cigarettes. He has been smoking about 1.00 pack per day. He has never used smokeless tobacco. He reports that he does not drink alcohol and does not use drugs.  Allergies:  Allergies  Allergen Reactions  . Cogentin [Benztropine] Other (See Comments)    Per MAR   . Penicillins Other (See Comments)    Per MAR - unable to verify patients PCN reaction   . Prolixin [Fluphenazine] Other (See Comments)    Per MAR     Medications:  I reviewed home medications   ROS:                                                                                                                                     Negative    Examination:                                                                                                      General: Appears well-developed  Psych: Affect appropriate to situation Eyes: No scleral injection HENT: No OP obstrucion Head: Normocephalic.  Cardiovascular: Normal rate and regular rhythm.  Respiratory: Effort normal and breath sounds normal to anterior ascultation GI: Soft.  No distension. There is no tenderness.  Skin: WDI    Neurological Examination Mental Status: Somnolent but easily arousable, oriented to place and age, not month.  Thought content appropriate.  Speech fluent without evidence of aphasia. Able to follow 3 step commands without difficulty. Cranial Nerves: II: Visual fields grossly normal,  III,IV, VI: ptosis not present, extra-ocular motions intact bilaterally, pupils equal, round, reactive to light and accommodation V,VII: smile symmetric, facial light touch sensation normal bilaterally VIII: hearing normal bilaterally IX,X: uvula rises symmetrically XI: bilateral shoulder shrug XII: midline tongue extension Motor: Right : Upper  extremity   5/5    Left:     Upper extremity   5/5  Lower extremity   5/5     Lower extremity   5/5 Tone and bulk:normal tone throughout; no atrophy noted Sensory: Pinprick and light touch intact throughout, bilaterally Plantars: Right: downgoing   Left: downgoing Cerebellar: normal finger-to-nose, normal rapid alternating movements and normal heel-to-shin test      Lab Results: Basic Metabolic Panel: Recent Labs  Lab 02/09/20 1421 02/09/20 1459 02/09/20 1620  NA 141 140 139  K 3.0* 5.2* 4.6  CL 123*  --  111  CO2 10*  --  20*  GLUCOSE 86  --  100*  BUN 21*  --  28*  CREATININE 0.82  --  1.28*  CALCIUM 5.3*  --  8.9    CBC: Recent Labs  Lab 02/09/20 1421 02/09/20 1459 02/09/20 1620  WBC 6.6  --  10.4  NEUTROABS  --   --  6.9  HGB 8.8* 13.3 13.0  HCT 29.4* 39.0 42.6  MCV 89.9  --  86.6  PLT 109*  --  223    Coagulation Studies: No results for input(s): LABPROT, INR in the last 72 hours.  Imaging: CT Head Wo Contrast  Result Date: 02/09/2020 CLINICAL DATA:  Change in mental status EXAM: CT HEAD WITHOUT CONTRAST TECHNIQUE: Contiguous axial images were obtained from the base of the skull through the vertex without intravenous contrast. COMPARISON:  None. FINDINGS: Brain: No evidence of acute territorial infarction, hemorrhage, hydrocephalus,extra-axial collection or mass lesion/mass effect. There is dilatation the ventricles and sulci consistent with age-related atrophy. Low-attenuation changes in the deep white matter consistent with small vessel ischemia. Vascular: No hyperdense vessel or unexpected calcification. Skull: The skull is intact. No fracture or focal lesion identified. Sinuses/Orbits: The visualized paranasal sinuses and mastoid air cells are clear. The orbits and globes intact. Other: None Cervical spine: Alignment: There is straightening of the normal cervical lordosis. Skull base and vertebrae: Visualized skull base is intact. No atlanto-occipital  dissociation. The vertebral body heights are well maintained. No fracture or pathologic osseous lesion seen. Soft tissues and spinal canal: The visualized paraspinal soft tissues are unremarkable. No prevertebral soft tissue swelling is seen. The spinal canal is grossly unremarkable, no large epidural collection or significant canal narrowing. Disc levels: Mild disc height loss with anterior osteophytes is most notable at C6-C7 mild bilateral neural foraminal narrowing. Upper chest: The lung apices are clear. Thoracic inlet is within normal limits. Other: None IMPRESSION: No acute intracranial abnormality. Findings consistent with age related atrophy and chronic small vessel ischemia No acute fracture or malalignment of the spine. Electronically Signed   By: Jonna Clark M.D.   On: 02/09/2020 15:39   CT Cervical Spine Wo Contrast  Result Date: 02/09/2020 CLINICAL DATA:  Change in mental status EXAM: CT HEAD WITHOUT CONTRAST TECHNIQUE: Contiguous axial images were obtained from the base of the skull through the vertex without intravenous contrast. COMPARISON:  None. FINDINGS: Brain: No evidence of acute territorial infarction, hemorrhage, hydrocephalus,extra-axial collection or mass lesion/mass effect. There is dilatation the ventricles and sulci consistent with age-related atrophy. Low-attenuation changes in the deep white matter consistent with small vessel ischemia. Vascular: No hyperdense vessel or unexpected calcification. Skull: The skull is intact. No fracture or focal lesion identified. Sinuses/Orbits: The visualized paranasal sinuses and mastoid air cells are clear. The orbits and globes intact. Other: None Cervical spine: Alignment: There is straightening of the normal cervical lordosis. Skull base and vertebrae: Visualized skull base is intact. No atlanto-occipital dissociation. The vertebral body heights are well maintained. No fracture or pathologic osseous lesion seen. Soft tissues and spinal canal:  The visualized paraspinal soft tissues are unremarkable. No prevertebral soft tissue swelling is seen. The spinal canal is grossly unremarkable, no large epidural collection or significant canal narrowing. Disc levels: Mild disc height loss with anterior osteophytes is most notable at C6-C7 mild bilateral neural foraminal narrowing. Upper chest: The lung apices are clear. Thoracic inlet is within normal limits. Other: None IMPRESSION: No acute intracranial abnormality. Findings consistent with age related atrophy and chronic small vessel ischemia No acute fracture or malalignment of the spine. Electronically Signed   By: Jonna Clark M.D.   On: 02/09/2020 15:39   DG Chest Portable 1 View  Result Date: 02/09/2020 CLINICAL DATA:  Altered mental status EXAM: PORTABLE CHEST 1 VIEW COMPARISON:  05/30/2016 FINDINGS: The heart size and mediastinal contours are within normal limits. Both lungs are clear. The visualized skeletal structures are unremarkable. IMPRESSION: No acute abnormality of the lungs in AP portable projection. Electronically Signed   By: Lauralyn Primes M.D.   On: 02/09/2020 15:08     ASSESSMENT AND PLAN  59 year old male with possible seizure history, schizophrenia,  hypertension hyperlipidemia presents to the ED after episode of sudden unresponsiveness. Unclear if this is seizure however, given prior seizure history and gradual improvement this definitely could be a seizure with postictal state.  Patient's Depakote level slightly subtherapeutic.  Acute metabolic encephalopathy versus seizure with postictal state  Recommendations Routine EEG tomorrow Continue home Depakote, 1250 nightly MRI brain without contrast Seizure precautions Infectious and metabolic work-up  Neurology will continue to follow  Myking Sar [facility] Pager Number [phone]

## 2020-02-09 NOTE — ED Provider Notes (Signed)
[facility] EMERGENCY DEPARTMENT Provider Note   CSN: [id number] Arrival date & time: 02/09/20  1357     History Chief Complaint  Patient presents with  . Loss of Consciousness    [name] is a 59 y.o. male.   Level 5 caveat due to acuity of condition, patient is only responsive to noxious stimuli.  I was called to bedside by RN.  Reportedly tells me patient was brought by EMS and taken to triage and brought immediately to the room.  Patient is from a group home, unknown name or location.  Per EMS he slumped over at the table while eating rice.  Hypotensive with EMS BP 58/46.  Upon arrival to triage patient has snoring respirations minimally responsive to painful stimuli.  He received 500 cc NS prior to arrival with improvement in 108/72.  CBG in 114.  Will attempt to contact group home and family.  At this time patient only pulls away from painful stimulus.  Pupils are pinpoint.  Snoring.  He has history of seizures.  HPI     Past Medical History:  Diagnosis Date  . Diabetes mellitus without complication (HCC)   . GERD (gastroesophageal reflux disease)   . Hyperlipidemia   . Hypertension   . Paranoid schizophrenia (HCC)   . Sleep apnea   . Uric acid nephrolithiasis     Patient Active Problem List   Diagnosis Date Noted  . Acute psychosis (HCC)   . Leukocytosis 02/06/2015  . Sleep apnea 02/05/2015  . AKI (acute kidney injury) (HCC) 02/04/2015  . Seizure disorder (HCC) 02/04/2015  . Acute encephalopathy 02/04/2015  . Diabetes mellitus without complication (HCC)   . Hyperlipidemia   . Hypertension   . Paranoid schizophrenia (HCC)     History reviewed. No pertinent surgical history.     No family history on file.  Social History   Tobacco Use  . Smoking status: Current Every Day Smoker    Packs/day: 1.00    Types: Cigarettes  . Smokeless tobacco: Never Used  Substance Use Topics  . Alcohol use: No  . Drug use: No    Home  Medications Prior to Admission medications   Medication Sig Start Date End Date Taking? Authorizing Provider  albuterol (PROVENTIL HFA;VENTOLIN HFA) 108 (90 BASE) MCG/ACT inhaler Inhale 2 puffs into the lungs every 4 (four) hours as needed for wheezing or shortness of breath. 04/21/15   Hayden Rasmussen, NP  amantadine (SYMMETREL) 100 MG capsule Take 1 capsule (100 mg total) by mouth 2 (two) times daily. 04/26/16   Oneta Rack, NP  cloZAPine (CLOZARIL) 100 MG tablet Take 150 mg by mouth daily.     [provider]  dicyclomine (BENTYL) 20 MG tablet Take 1 tablet (20 mg total) by mouth 2 (two) times daily. 05/13/17   Dartha Lodge, PA-C  divalproex (DEPAKOTE ER) 250 MG 24 hr tablet Take 5 tablets (1,250 mg total) by mouth at bedtime. 04/26/16   Oneta Rack, NP  insulin glargine (LANTUS) 100 UNIT/ML injection Inject 0.2 mLs (20 Units total) into the skin at bedtime. Patient taking differently: Inject 25 Units into the skin at bedtime.  04/26/16   Oneta Rack, NP  Lactobacillus-Inulin (CULTURELLE DIGESTIVE HEALTH) CAPS Take 1 capsule by mouth daily. 05/13/17   Dartha Lodge, PA-C  lisinopril (PRINIVIL,ZESTRIL) 20 MG tablet Take 1 tablet (20 mg total) by mouth daily. 04/27/16   Oneta Rack, NP  metFORMIN (GLUCOPHAGE) 1000 MG tablet Take  1,000 mg by mouth 2 (two) times daily with a meal.    [provider]  QUEtiapine (SEROQUEL) 100 MG tablet Take 100 mg by mouth at bedtime.    [provider]  QUEtiapine (SEROQUEL) 50 MG tablet Take 50 mg by mouth every morning.     [provider]  zolpidem (AMBIEN) 10 MG tablet Take 10 mg by mouth at bedtime.     [provider]    Allergies    Cogentin [benztropine], Fluphenazine hcl, Penicillins, and Prolixin [fluphenazine]  Review of Systems   Review of Systems  Unable to perform ROS: Other  All other systems reviewed and are negative.   Physical Exam Updated Vital Signs BP (!) 129/111   Pulse 98    Temp 98.1 F (36.7 C) (Oral)   Resp 18   SpO2 100%   Physical Exam Vitals and nursing note reviewed.  Constitutional:      General: He is not in acute distress.    Appearance: He is well-developed.     Comments: Asleep, snoring.  HENT:     Head: Normocephalic.     Comments: No obvious signs of facial scalp trauma.    Right Ear: External ear normal.     Left Ear: External ear normal.     Nose: Nose normal.     Mouth/Throat:     Comments: No intraoral tongue or lip injury.  Moist mucous membranes. Eyes:     General: No scleral icterus.    Conjunctiva/sclera: Conjunctivae normal.  Cardiovascular:     Rate and Rhythm: Normal rate and regular rhythm.     Heart sounds: Normal heart sounds. No murmur heard.   Pulmonary:     Effort: Pulmonary effort is normal.     Breath sounds: Normal breath sounds. No wheezing.  Abdominal:     Palpations: Abdomen is soft.     Tenderness: There is no abdominal tenderness.  Musculoskeletal:        General: Normal range of motion.     Cervical back: Normal range of motion and neck supple.  Skin:    General: Skin is warm and dry.     Capillary Refill: Capillary refill takes less than 2 seconds.  Neurological:     Mental Status: He is alert. He is disoriented.     Comments: Patient is asleep.  Snoring loudly.  Pulls away from painful stimulus.  Does not follow commands otherwise.  After 2 mg of Narcan there is no significant improvement.  He intermittently will open his eyes and mumble but fall back asleep.  He did briefly wake up open his mouth and stick his tongue out for me but is not following commands consistently.  Psychiatric:        Behavior: Behavior normal.        Thought Content: Thought content normal.        Judgment: Judgment normal.     ED Results / Procedures / Treatments   Labs (all labs ordered are listed, but only abnormal results are displayed) Labs Reviewed  BASIC METABOLIC PANEL - Abnormal; Notable for the following  components:      Result Value   Potassium 3.0 (*)    Chloride 123 (*)    CO2 10 (*)    BUN 21 (*)    Calcium 5.3 (*)    All other components within normal limits  CBC - Abnormal; Notable for the following components:   RBC 3.27 (*)    Hemoglobin 8.8 (*)  HCT 29.4 (*)    MCHC 29.9 (*)    Platelets 109 (*)    nRBC 0.3 (*)    All other components within normal limits  CBG MONITORING, ED - Abnormal; Notable for the following components:   Glucose-Capillary 123 (*)    All other components within normal limits  I-STAT VENOUS BLOOD GAS, ED - Abnormal; Notable for the following components:   pCO2, Ven 41.1 (*)    pO2, Ven 48.0 (*)    Acid-base deficit 4.0 (*)    Potassium 5.2 (*)    Calcium, Ion 1.14 (*)    All other components within normal limits  URINALYSIS, ROUTINE W REFLEX MICROSCOPIC  HEPATIC FUNCTION PANEL  LACTIC ACID, PLASMA  LACTIC ACID, PLASMA  ETHANOL  AMMONIA  RAPID URINE DRUG SCREEN, HOSP PERFORMED  SALICYLATE LEVEL  ACETAMINOPHEN LEVEL  CBC WITH DIFFERENTIAL/PLATELET  COMPREHENSIVE METABOLIC PANEL  CBG MONITORING, ED    EKG EKG Interpretation  Date/Time:  Saturday February 09 2020 14:28:23 EDT Ventricular Rate:  93 PR Interval:    QRS Duration: 84 QT Interval:  333 QTC Calculation: 415 R Axis:   78 Text Interpretation: Sinus rhythm No significant change since last tracing Confirmed by Richardean CanalYao, David H [phone]) on 02/09/2020 3:04:40 PM   Radiology DG Chest Portable 1 View  Result Date: 02/09/2020 CLINICAL DATA:  Altered mental status EXAM: PORTABLE CHEST 1 VIEW COMPARISON:  05/30/2016 FINDINGS: The heart size and mediastinal contours are within normal limits. Both lungs are clear. The visualized skeletal structures are unremarkable. IMPRESSION: No acute abnormality of the lungs in AP portable projection. Electronically Signed   By: Lauralyn PrimesAlex  Bibbey M.D.   On: 02/09/2020 15:08    Procedures Procedures (including critical care time)  Medications Ordered in  ED Medications  sodium chloride flush (NS) 0.9 % injection 3 mL (has no administration in time range)  naloxone (NARCAN) 2 MG/2ML injection (has no administration in time range)  naloxone [facility](NARCAN) 2 MG/2ML injection (has no administration in time range)  sodium chloride 0.9 % bolus 1,000 mL (1,000 mLs Intravenous New Bag/Given 02/09/20 1445)    ED Course  I have reviewed the triage vital signs and the nursing notes.  Pertinent labs & imaging results that were available during my care of the patient were reviewed by me and considered in my medical decision making (see chart for details).  Clinical Course as of Feb 08 1518  Sat Feb 09, 2020  1507 Talk to DickinsonStephanie centimeters to help find contact information and determine where patient lives.  At this time we do not know what group home he is from.  We do not have an emergency contact.  We do not have medicine list.   [CG]    Clinical Course User Index [CG] Liberty HandyGibbons, Tarell Schollmeyer J, PA-C   MDM Rules/Calculators/A&P                          [age] year old male brought to the ER by EMS from unknown group home for slumping over while eating dinner.  We have attempted to contact patient's emergency contact and unable to.  We do not know what group home he is coming from.  Case management Judeth CornfieldStephanie is involved.  Per EMS patient hypotensive on route.  He received IV fluids and blood pressure slightly improved initially in triage however 2 other hypotensive readings here in the ER.  Patient given total of 4 mg Narcan without significant response.  Patient has fluctuating  mental status.  At times he will respond to stimulus and follow commands but quickly falls asleep.  I have ordered continuous cardiac monitoring, continuous pulse oximeter, end-tidal, seizure precautions.  1 L NS ordered.  Highest on differential diagnosis is postictal state.  Given his hypotension possible hypovolemia, orthostasis.  Differential diagnosis is limited at this time as we  do not have much past medical history.  Given limited history, exam extensive ER work-up initiated including labs, CTs. shared with EDP.  1515: ER work-up personally visualized and interpreted.  Several diagnostic studies still pending at shift change.  EKG without significant abnormalities.  Chest x-ray is nonacute.  CBC reveals hemoglobin 8.8 down from a normal value 2 years ago.  Unclear how acute this is.  Other labs still pending.  Patient handed off to oncoming ED PA/MD who will reassess patient, follow-up on labs and determine disposition.  Low threshold to admit if no improvement in mental status, continued hypotension or unable to obtain collateral information.  Final Clinical Impression(s) / ED Diagnoses Final diagnoses:  Altered mental status, unspecified altered mental status type    Rx / DC Orders ED Discharge Orders    None       Liberty Handy, PA-C 02/09/20 1519    Virgina Norfolk, DO 02/09/20 1529

## 2020-02-09 NOTE — Progress Notes (Signed)
CSW completed APS report at this time.

## 2020-02-09 NOTE — ED Provider Notes (Signed)
Medical screening examination/treatment/procedure(s) were conducted as a shared visit with non-physician practitioner(s) and myself.  I personally evaluated the patient during the encounter. Briefly, the patient is a 59 y.o. male who presents to the ED after episode of unresponsiveness.  History of diabetes, reflux, seizures.  Unremarkable vitals upon arrival here.  No real response from Narcan.  History is given by EMS who reports that patient slumped over while eating rice at group home.  I have tried to call all phone numbers and medical chart but not able to get in touch with anybody who knows the patient.  Patient's mother's phone number was wrong in the chart.  Case manager number in the chart is of an old case Production designer, theatre/television/film.  Patient appears most likely post ictal.  No fever.  He tells me that he is on Dilantin but is only arousable to stimuli.  We'll get head imaging.  EKG shows sinus rhythm.  Will obtain lab work to evaluate for altered mental status.  However, suspect postictal state and will have oncoming ED team evaluate the patient.  Have reached out to social work to try to find out where patient lives to try to get more information about the patient and her current medication list.  Please see oncoming ED staff note about further results, evaluation, disposition of the patient.  This chart was dictated using voice recognition software.  Despite best efforts to proofread,  errors can occur which can change the documentation meaning.     EKG Interpretation  Date/Time:  Saturday February 09 2020 14:28:23 EDT Ventricular Rate:  93 PR Interval:    QRS Duration: 84 QT Interval:  333 QTC Calculation: 415 R Axis:   78 Text Interpretation: Sinus rhythm No significant change since last tracing Confirmed by Richardean Canal [phone]) on 02/09/2020 3:04:40 PM           Virgina Norfolk, DO 02/09/20 1522

## 2020-02-09 NOTE — ED Provider Notes (Signed)
Care assumed from [place], PA-C at shift change with labs and imaging pending.   In brief, this patient is a 59 year old male with past medical story of seizure who presents for evaluation of LOC.  EMS reports that patient slumped over while eating rice.  Patient is from a group home but unfortunately, we do not know the name or location.  EMS did report that patient was hypotensive.  Patient was initially sent to triage and then previous provider was called to triage to evaluate him given that he was having limited response.  They gave Narcan with minimal improvement.  He was snoring and was minimally responsive to painful stimuli.  Patient does have a history of seizures.  Please see note from previous provider for full history/physical exam.    Physical Exam  BP (!) 125/96   Pulse 74   Temp 98.1 F (36.7 C) (Oral)   Resp 17   SpO2 100%   Physical Exam Drowsy.  Responds to loud verbal stimuli.  Occasionally nods off during conversation and has to be tapped to wake up.  Alert and oriented x3.  Can answer some questions.    ED Course/Procedures   Clinical Course as of Feb 09 2028  Sat Feb 09, 2020  1507 Talk to Adell centimeters to help find contact information and determine where patient lives.  At this time we do not know what group home he is from.  We do not have an emergency contact.  We do not have medicine list.   [CG]    Clinical Course User Index [CG] Liberty Handy, PA-C    Procedures   Results for orders placed or performed during the hospital encounter of 02/09/20 (from the past 24 hour(s))  Basic metabolic panel     Status: Abnormal   Collection Time: 02/09/20  2:21 PM  Result Value Ref Range   Sodium 141 135 - 145 mmol/L   Potassium 3.0 (L) 3.5 - 5.1 mmol/L   Chloride 123 (H) 98 - 111 mmol/L   CO2 10 (L) 22 - 32 mmol/L   Glucose, Bld 86 70 - 99 mg/dL   BUN 21 (H) 6 - 20 mg/dL   Creatinine, Ser 1.60 0.61 - 1.24 mg/dL   Calcium 5.3 (LL) 8.9 -  10.3 mg/dL   GFR calc non Af Amer >60 >60 mL/min   GFR calc Af Amer >60 >60 mL/min   Anion gap 8 5 - 15  CBC     Status: Abnormal   Collection Time: 02/09/20  2:21 PM  Result Value Ref Range   WBC 6.6 4.0 - 10.5 K/uL   RBC 3.27 (L) 4.22 - 5.81 MIL/uL   Hemoglobin 8.8 (L) 13.0 - 17.0 g/dL   HCT 10.9 (L) 39 - 52 %   MCV 89.9 80.0 - 100.0 fL   MCH 26.9 26.0 - 34.0 pg   MCHC 29.9 (L) 30.0 - 36.0 g/dL   RDW 32.3 55.7 - 32.2 %   Platelets 109 (L) 150 - 400 K/uL   nRBC 0.3 (H) 0.0 - 0.2 %  POC CBG, ED     Status: Abnormal   Collection Time: 02/09/20  2:26 PM  Result Value Ref Range   Glucose-Capillary 123 (H) 70 - 99 mg/dL   Comment 1 Notify RN    Comment 2 Document in Chart   Lactic acid, plasma     Status: None   Collection Time: 02/09/20  2:39 PM  Result Value Ref Range  Lactic Acid, Venous 1.8 0.5 - 1.9 mmol/L  Ammonia     Status: None   Collection Time: 02/09/20  2:39 PM  Result Value Ref Range   Ammonia 19 9 - 35 umol/L  Ethanol     Status: None   Collection Time: 02/09/20  2:52 PM  Result Value Ref Range   Alcohol, Ethyl (B) <10 <10 mg/dL  Salicylate level     Status: Abnormal   Collection Time: 02/09/20  2:52 PM  Result Value Ref Range   Salicylate Lvl <7.0 (L) 7.0 - 30.0 mg/dL  Acetaminophen level     Status: Abnormal   Collection Time: 02/09/20  2:52 PM  Result Value Ref Range   Acetaminophen (Tylenol), Serum <10 (L) 10 - 30 ug/mL  Phenytoin level, total     Status: Abnormal   Collection Time: 02/09/20  2:52 PM  Result Value Ref Range   Phenytoin Lvl <2.5 (L) 10.0 - 20.0 ug/mL  Valproic acid level     Status: Abnormal   Collection Time: 02/09/20  2:52 PM  Result Value Ref Range   Valproic Acid Lvl 49 (L) 50.0 - 100.0 ug/mL  I-Stat venous blood gas, [facility] ED)     Status: Abnormal   Collection Time: 02/09/20  2:59 PM  Result Value Ref Range   pH, Ven 7.327 7.25 - 7.43   pCO2, Ven 41.1 (L) 44 - 60 mmHg   pO2, Ven 48.0 (H) 32 - 45 mmHg   Bicarbonate 21.5 20.0 -  28.0 mmol/L   TCO2 23 22 - 32 mmol/L   O2 Saturation 80.0 %   Acid-base deficit 4.0 (H) 0.0 - 2.0 mmol/L   Sodium 140 135 - 145 mmol/L   Potassium 5.2 (H) 3.5 - 5.1 mmol/L   Calcium, Ion 1.14 (L) 1.15 - 1.40 mmol/L   HCT 39.0 39 - 52 %   Hemoglobin 13.3 13.0 - 17.0 g/dL   Sample type VENOUS   Lactic acid, plasma     Status: Abnormal   Collection Time: 02/09/20  4:20 PM  Result Value Ref Range   Lactic Acid, Venous 2.5 (HH) 0.5 - 1.9 mmol/L  CBC with Differential/Platelet     Status: Abnormal   Collection Time: 02/09/20  4:20 PM  Result Value Ref Range   WBC 10.4 4.0 - 10.5 K/uL   RBC 4.92 4.22 - 5.81 MIL/uL   Hemoglobin 13.0 13.0 - 17.0 g/dL   HCT 45.6 39 - 52 %   MCV 86.6 80.0 - 100.0 fL   MCH 26.4 26.0 - 34.0 pg   MCHC 30.5 30.0 - 36.0 g/dL   RDW 25.6 38.9 - 37.3 %   Platelets 223 150 - 400 K/uL   nRBC 0.0 0.0 - 0.2 %   Neutrophils Relative % 67 %   Neutro Abs 6.9 1.7 - 7.7 K/uL   Lymphocytes Relative 23 %   Lymphs Abs 2.3 0.7 - 4.0 K/uL   Monocytes Relative 10 %   Monocytes Absolute 1.1 (H) 0 - 1 K/uL   Eosinophils Relative 0 %   Eosinophils Absolute 0.0 0 - 0 K/uL   Basophils Relative 0 %   Basophils Absolute 0.0 0 - 0 K/uL   Immature Granulocytes 0 %   Abs Immature Granulocytes 0.04 0.00 - 0.07 K/uL  Comprehensive metabolic panel     Status: Abnormal   Collection Time: 02/09/20  4:20 PM  Result Value Ref Range   Sodium 139 135 - 145 mmol/L   Potassium 4.6 3.5 -  5.1 mmol/L   Chloride 111 98 - 111 mmol/L   CO2 20 (L) 22 - 32 mmol/L   Glucose, Bld 100 (H) 70 - 99 mg/dL   BUN 28 (H) 6 - 20 mg/dL   Creatinine, Ser 0.10 (H) 0.61 - 1.24 mg/dL   Calcium 8.9 8.9 - 93.2 mg/dL   Total Protein 5.9 (L) 6.5 - 8.1 g/dL   Albumin 3.0 (L) 3.5 - 5.0 g/dL   AST 16 15 - 41 U/L   ALT 11 0 - 44 U/L   Alkaline Phosphatase 59 38 - 126 U/L   Total Bilirubin 0.5 0.3 - 1.2 mg/dL   GFR calc non Af Amer >60 >60 mL/min   GFR calc Af Amer >60 >60 mL/min   Anion gap 8 5 - 15    MDM    PLAN: Patient pending lab work, imaging.  MDM:  Social work is try to get in contact with the health that he was at.  Unfortunately, the house was listed as closed.  Social work thinks that they may have lost her license and is operating as a boardinghouse.  Additionally, we have tried to get in touch with the case manager but she has not been with him since 4 years ago and does not know any new information regarding his status.  Additionally, she does not have any new records of his medications.  Social work attempted to [facility] but pharmacy is closed [place].  We cannot verify whether or not he is on Dilantin or Depakote.  He did tell previous provider that he takes Dilantin but I do not see any record of this in the system.  Chest x-ray unremarkable.  CT head and neck negative for any acute abnormality.  Acetaminophen, salicylate, ethanol.  Lactic acid is normal.  VBG shows pH of 7.3, P PO2 of 48, bicarb 21.  Ammonia is 19.  Repeat lactic was 2.5.  Patient given additional fluids.  CBC shows no leukocytosis.  Hemostable at 13.  CMP shows BUN of 28, creatinine of 1.28.  Reevaluation.  Patient still very drowsy.  He wakes up to loud verbal stimuli but will occasionally nod off.  He answers some questions but does not answer a lot.  At this time, concerned about prolonged postictal period. Will plan for admission.   Discussed with Dr. Amada Jupiter.  He recommends giving 1500 mg of Keppra as a loading dose.  And then waiting for the Depakote and Dilantin levels to come back before determining which other agent to give him.  Neuro will plan to consult.  Discussed patient with Dr. Stacie Acres (hospitalist). He accepts patient but requests an MRI get done for eval of stroke.    1. Altered mental status, unspecified altered mental status type    Portions of this note were generated with Dragon dictation software. Dictation errors may occur despite best attempts at proofreading.    Maxwell Caul, PA-C 02/09/20 2029    Charlynne Pander, MD 02/09/20 2259

## 2020-02-09 NOTE — Significant Event (Addendum)
   Hypoglycemic Event  CBG: 68  Treatment: 4 oz juice/soda  Symptoms: sweaty and lethargic (able to wake up but easily fall back asleep)  Follow-up CBG: Time:2258 CBG Result:76  Possible Reasons for Event: Inadequate meal intake?? Unsure at this time.   Comments/MD notified: Attending MD Dr. Rachael Darby page regarding Lantus 20u order.      Patient arrived from ED via stretcher, lethargic but able to wake and answered question, pt skin noted clammy and cold. BG check at 68. Ptable to wake up and drink 8oz of OJ and 1 package of gramham crackers. He is alert x 4, he shares that he live at a boarding home "[facility], [place], [name].?" He stated he doesn't know the phone number and a nurse usually gives him his medications. He verbalized that his mother name is  Michel Bickers but do not have a current phone number, last visit from his mom was about a year and a half ago. He denies any HA, dizziness, or n/v. Tele applied and confirmed. Safety precautions and orders reviewed.    Dr. Yolanda Bonine called back with order to hold lantus order at this time.    Arthor Captain

## 2020-02-09 NOTE — TOC Progression Note (Signed)
Transition of Care [facility]) - Progression Note    Patient Details  Name: Ronald Roman MRN: [id number] Date of Birth: 08-03-1960  [facility]) CM/SW Contact  Lockie Pares, RN Phone Number: 02/09/2020, 4:36 PM  Clinical Narrative:     Looked up both pharmacies on patients profile  as patient appears to be on Clozaril, from previous visits.J McKInney CSW  Wanted to ascertain if he is still taking this medication, if it had been filled recently. Unfortunately, both pharmacies listed are closed until Monday If he is not taking this, it could be of significance  Concerning his state currently.       Expected Discharge Plan and Services                                                 Social Determinants of Health (SDOH) Interventions    Readmission Risk Interventions No flowsheet data found.

## 2020-02-10 ENCOUNTER — Observation Stay (HOSPITAL_COMMUNITY): Payer: Medicaid Other

## 2020-02-10 DIAGNOSIS — G40909 Epilepsy, unspecified, not intractable, without status epilepticus: Secondary | ICD-10-CM | POA: Diagnosis not present

## 2020-02-10 LAB — CBC
HCT: 40.1 % (ref 39.0–52.0)
Hemoglobin: 12.9 g/dL — ABNORMAL LOW (ref 13.0–17.0)
MCH: 27.4 pg (ref 26.0–34.0)
MCHC: 32.2 g/dL (ref 30.0–36.0)
MCV: 85.1 fL (ref 80.0–100.0)
Platelets: 202 10*3/uL (ref 150–400)
RBC: 4.71 MIL/uL (ref 4.22–5.81)
RDW: 15.3 % (ref 11.5–15.5)
WBC: 9.2 10*3/uL (ref 4.0–10.5)
nRBC: 0 % (ref 0.0–0.2)

## 2020-02-10 LAB — BLOOD GAS, VENOUS
Acid-Base Excess: 1.2 mmol/L (ref 0.0–2.0)
Bicarbonate: 26.4 mmol/L (ref 20.0–28.0)
FIO2: 100
O2 Saturation: 36.6 %
Patient temperature: 36.9
pCO2, Ven: 50.5 mmHg (ref 44.0–60.0)
pH, Ven: 7.338 (ref 7.250–7.430)

## 2020-02-10 LAB — GLUCOSE, CAPILLARY
Glucose-Capillary: 88 mg/dL (ref 70–99)
Glucose-Capillary: 92 mg/dL (ref 70–99)
Glucose-Capillary: 99 mg/dL (ref 70–99)
Glucose-Capillary: 154 mg/dL — ABNORMAL HIGH (ref 70–99)

## 2020-02-10 LAB — COMPREHENSIVE METABOLIC PANEL
ALT: 11 U/L (ref 0–44)
AST: 16 U/L (ref 15–41)
Albumin: 3 g/dL — ABNORMAL LOW (ref 3.5–5.0)
Alkaline Phosphatase: 59 U/L (ref 38–126)
Anion gap: 8 (ref 5–15)
BUN: 28 mg/dL — ABNORMAL HIGH (ref 6–20)
CO2: 20 mmol/L — ABNORMAL LOW (ref 22–32)
Calcium: 8.9 mg/dL (ref 8.9–10.3)
Chloride: 111 mmol/L (ref 98–111)
Creatinine, Ser: 1.28 mg/dL — ABNORMAL HIGH (ref 0.61–1.24)
GFR calc Af Amer: >60 mL/min (ref >60)
GFR calc non Af Amer: >60 mL/min (ref >60)
Glucose, Bld: 100 mg/dL — ABNORMAL HIGH (ref 70–99)
Potassium: 4.6 mmol/L (ref 3.5–5.1)
Sodium: 139 mmol/L (ref 135–145)
Total Bilirubin: 0.5 mg/dL (ref 0.3–1.2)
Total Protein: 5.9 g/dL — ABNORMAL LOW (ref 6.5–8.1)

## 2020-02-10 LAB — BASIC METABOLIC PANEL
Anion gap: 11 (ref 5–15)
BUN: 22 mg/dL — ABNORMAL HIGH (ref 6–20)
CO2: 17 mmol/L — ABNORMAL LOW (ref 22–32)
Calcium: 8.9 mg/dL (ref 8.9–10.3)
Chloride: 111 mmol/L (ref 98–111)
Creatinine, Ser: 1.06 mg/dL (ref 0.61–1.24)
GFR calc Af Amer: >60 mL/min (ref >60)
GFR calc non Af Amer: >60 mL/min (ref >60)
Glucose, Bld: 156 mg/dL — ABNORMAL HIGH (ref 70–99)
Potassium: 4.3 mmol/L (ref 3.5–5.1)
Sodium: 139 mmol/L (ref 135–145)

## 2020-02-10 LAB — TSH: TSH: 0.888 u[IU]/mL (ref 0.350–4.500)

## 2020-02-10 LAB — HIV ANTIBODY (ROUTINE TESTING W REFLEX): HIV Screen 4th Generation wRfx: NONREACTIVE

## 2020-02-10 LAB — HEMOGLOBIN A1C
Hgb A1c MFr Bld: 5.5 % (ref 4.8–5.6)
Mean Plasma Glucose: 111.15 mg/dL

## 2020-02-10 MED ORDER — DIVALPROEX SODIUM ER 500 MG PO TB24
1500.0000 mg | ORAL_TABLET | Freq: Every day | ORAL | Status: DC
  Administered 2020-02-10 – 2020-02-11 (×2): 1500 mg via ORAL
  Filled 2020-02-10 (×3): qty 3

## 2020-02-10 NOTE — Progress Notes (Signed)
NEURO HOSPITALIST PROGRESS NOTE   Subjective: Patient in bed and very drowsy. He was cooperative with exam, although needed to asked multiple times because he kept trying to go back to sleep. He believes he had his first seizure around age 58. He is not able to describe what a typical seizure looks like or how often they occur.  Exam: Vitals:   02/09/20 2243 02/10/20 0503  BP: 119/66 109/69  Pulse: 69 68  Resp: 16 16  Temp: (!) 97.5 F (36.4 C) 98.4 F (36.9 C)  SpO2: 98% 99%    Physical Exam  Constitutional: Appears well-developed and well-nourished.  Psych: Affect appropriate to situation Eyes: Normal external eye and conjunctiva. HENT: Normocephalic, no lesions, without obvious abnormality.   Musculoskeletal-no joint tenderness, deformity or swelling Cardiovascular: Normal rate and regular rhythm.  Respiratory: Effort normal, non-labored breathing saturations WNL GI: Soft.  No distension. There is no tenderness.  Skin: WDI   Neuro Exam  Mental Status: Alert, oriented to self, place and year. Not month. When asked number of quarters in a dollar he said 20 dimes and then answered 60. Able to spell WORLD frontwards, would not attempt backwards. Very drowsy. Speech fluent without evidence of aphasia.  Able to follow 2 step commands, although required repitition of directions because he tried to go back to sleep throughout the visit. Cranial Nerves: II: Visual fields grossly normal,  III,IV, VI: ptosis not present, extra-ocular motions intact bilaterally pupils equal, round, reactive to light and accommodation V,VII: smile symmetric, facial light touch sensation normal bilaterally VIII: hearing normal bilaterally IX,X: uvula rises symmetrically XI: bilateral shoulder shrug XII: midline tongue extension Motor: Right : Upper extremity   5/5    Left:     Upper extremity   5/5  Lower extremity   5/5     Lower extremity   5/5 Tone and bulk:normal tone  throughout; no atrophy noted Sensory:  light touch intact throughout, bilaterally Cerebellar: normal finger-to-nose   Medications: I have reviewed the patient's current medications.  Pertinent Labs/Diagnostics:   CT Head Wo Contrast  Result Date: 02/09/2020 CLINICAL DATA:  Change in mental status EXAM: CT HEAD WITHOUT CONTRAST TECHNIQUE: Contiguous axial images were obtained from the base of the skull through the vertex without intravenous contrast. COMPARISON:  None. FINDINGS: Brain: No evidence of acute territorial infarction, hemorrhage, hydrocephalus,extra-axial collection or mass lesion/mass effect. There is dilatation the ventricles and sulci consistent with age-related atrophy. Low-attenuation changes in the deep white matter consistent with small vessel ischemia. Vascular: No hyperdense vessel or unexpected calcification. Skull: The skull is intact. No fracture or focal lesion identified. Sinuses/Orbits: The visualized paranasal sinuses and mastoid air cells are clear. The orbits and globes intact. Other: None Cervical spine: Alignment: There is straightening of the normal cervical lordosis. Skull base and vertebrae: Visualized skull base is intact. No atlanto-occipital dissociation. The vertebral body heights are well maintained. No fracture or pathologic osseous lesion seen. Soft tissues and spinal canal: The visualized paraspinal soft tissues are unremarkable. No prevertebral soft tissue swelling is seen. The spinal canal is grossly unremarkable, no large epidural collection or significant canal narrowing. Disc levels: Mild disc height loss with anterior osteophytes is most notable at C6-C7 mild bilateral neural foraminal narrowing. Upper chest: The lung apices are clear. Thoracic inlet is within normal limits. Other: None IMPRESSION: No acute intracranial abnormality. Findings consistent with age  related atrophy and chronic small vessel ischemia No acute fracture or malalignment of the spine.  Electronically Signed   By: Jonna Clark M.D.   On: 02/09/2020 15:39   CT Cervical Spine Wo Contrast  Result Date: 02/09/2020 CLINICAL DATA:  Change in mental status EXAM: CT HEAD WITHOUT CONTRAST TECHNIQUE: Contiguous axial images were obtained from the base of the skull through the vertex without intravenous contrast. COMPARISON:  None. FINDINGS: Brain: No evidence of acute territorial infarction, hemorrhage, hydrocephalus,extra-axial collection or mass lesion/mass effect. There is dilatation the ventricles and sulci consistent with age-related atrophy. Low-attenuation changes in the deep white matter consistent with small vessel ischemia. Vascular: No hyperdense vessel or unexpected calcification. Skull: The skull is intact. No fracture or focal lesion identified. Sinuses/Orbits: The visualized paranasal sinuses and mastoid air cells are clear. The orbits and globes intact. Other: None Cervical spine: Alignment: There is straightening of the normal cervical lordosis. Skull base and vertebrae: Visualized skull base is intact. No atlanto-occipital dissociation. The vertebral body heights are well maintained. No fracture or pathologic osseous lesion seen. Soft tissues and spinal canal: The visualized paraspinal soft tissues are unremarkable. No prevertebral soft tissue swelling is seen. The spinal canal is grossly unremarkable, no large epidural collection or significant canal narrowing. Disc levels: Mild disc height loss with anterior osteophytes is most notable at C6-C7 mild bilateral neural foraminal narrowing. Upper chest: The lung apices are clear. Thoracic inlet is within normal limits. Other: None IMPRESSION: No acute intracranial abnormality. Findings consistent with age related atrophy and chronic small vessel ischemia No acute fracture or malalignment of the spine. Electronically Signed   By: Jonna Clark M.D.   On: 02/09/2020 15:39   MR BRAIN WO CONTRAST  Result Date: 02/09/2020 CLINICAL DATA:   59 year old male with decreased mental status. Delirium. EXAM: MRI HEAD WITHOUT CONTRAST TECHNIQUE: Multiplanar, multiecho pulse sequences of the brain and surrounding structures were obtained without intravenous contrast. COMPARISON:  Head and cervical spine CT earlier today. Brain MRI 02/06/2015. FINDINGS: Brain: No restricted diffusion to suggest acute infarction. No midline shift, mass effect, evidence of mass lesion, ventriculomegaly, extra-axial collection or acute intracranial hemorrhage. Cervicomedullary junction and pituitary are within normal limits. Largely stable and normal for age gray and white matter signal throughout the brain. Periatrial white matter T2 and FLAIR hyperintensity has mildly progressed and is now greater on the right. But there is no associated cortical encephalomalacia or chronic cerebral blood products. White matter signal elsewhere, the deep gray nuclei, brainstem and cerebellum are unremarkable. Vascular: Major intracranial vascular flow voids are stable since 2016. Skull and upper cervical spine: Negative visible cervical spine, bone marrow signal. Sinuses/Orbits: Disconjugate gaze. Otherwise negative orbits. Paranasal Visualized paranasal sinuses and mastoids are stable and well pneumatized. Other: Visible internal auditory structures appear normal. Small T2 hyperintense areas in both parotid glands (series 14, image 3) have not significantly changed since 2016 and appear inconsequential. Chronic vertex scalp soft tissue scarring. IMPRESSION: 1. No acute intracranial abnormality. 2. Mild progression of nonspecific periventricular white matter signal changes since 2016. Otherwise stable and negative for age noncontrast MRI appearance of the brain. Electronically Signed   By: Odessa Fleming M.D.   On: 02/09/2020 22:37   DG Chest Portable 1 View  Result Date: 02/09/2020 CLINICAL DATA:  Altered mental status EXAM: PORTABLE CHEST 1 VIEW COMPARISON:  05/30/2016 FINDINGS: The heart size  and mediastinal contours are within normal limits. Both lungs are clear. The visualized skeletal structures are unremarkable. IMPRESSION: No acute  abnormality of the lungs in AP portable projection. Electronically Signed   By: Lauralyn Primes M.D.   On: 02/09/2020 15:08   Assessment:  Ronald Roman is a 59 y.o. male past medical history significant for diabetes mellitus, hypertension, hyperlipidemia, paranoid schizophrenia, possible seizures on Depakote presents to the ED with breakthrough seizures. He lives in a group home and had sudden onset unresponsiveness with no jerking movements, tongue biting or incontinence. Triage nurse documented EMS BP of 54/86. In the Emergency room the patient was able to share he takes Depakote and Dilantin, however only Depakote was on his list of medications. Seen by Neurohospitalist Dr. Cyril Mourning in 2016 and the patient shared he was on depakote for seizure, that consult shared that prior seizure history was unknown, but EEG at that time was normal. Has not followed up with a neurologist since.    Bolus of Keppra was given in ED and continued at 500mg  Q12H. Home dose of depakote was also continued.    CT head was unremarkable  MRI Brain: No acute intracranial abnormality.Mild progression of nonspecific periventricular white matter signal changes since 2016.   Depakote level 49  Dilantin: undetectable   Infectious workup: WBC normal. Urinalysis glucose, otherwise unremarkable.  TSH: within normal limits 0.888   Patient is very drowsy today. He is oriented to self, place and year. Unsure of month. He is following commands, although needs to be asked more than due to drowsiness. Strength 5/5 in all extremities. He said he began having seizures around age 89. He was unable to describe a typical seizure and did not know how often the occurred.    Impression:  Acute metabolic encephalopathy versus seizure with postictal state  Recommendations:   EEG:  Pending  Depakote Level 49: Increase Depakote 1,500 ER QHS. Order modified.   Discontinue Keppra - current drowsiness might be related to keppra. Order placed to discontinue.  Internal medicine to address severe hypotension on presentation per EMS 54/86   Seizure precautions   Encourage smoking cessation: currently 1 pack per day per EDP note  41 DNP, FNP-C  [facility] Nurse Practitioner  Plan discussed with attending neurologist, note review to follow.  02/10/2020, 10:32 AM

## 2020-02-10 NOTE — Progress Notes (Signed)
Called RT regarding blood gas, arterial order RT to come to bedside shortly Pt not in acute distress EEG technician at bedside now  Will continue to monitor

## 2020-02-10 NOTE — Evaluation (Signed)
Physical Therapy Evaluation Patient Details Name: Ronald Roman MRN: [id number] DOB: [birth year] Today's Date: 02/10/2020   History of Present Illness  Patient is a 59 y/o male who presents with episode of unresponsiveness, found slumped over when eating. Admitted with acute metabolic encephalopathy vs seizure with posital state. PMH includes paranoid schizophrenia, seizure disorder, HTN, dyslipidemia.  Clinical Impression  Patient presents with impaired balance, impaired cognition, decreased safety awareness and impaired mobility. Pt not the best historian but reports living in a group home and being independent for ADLs/IADLs PTA. Today, pt requires Min guard-Min A for balance/safety during gait training with pt reaching for rail/wall for support at times. Noted to have some knee instability but no buckling. Might benefit from DME to improve safety and decrease fall risk. Will trial RW next session. Anticipate mobility will improve with increased activity. Will follow acutely to maximize independence and mobility prior to return home.    Follow Up Recommendations No PT follow up;Supervision for mobility/OOB    Equipment Recommendations  Rolling walker with 5" wheels (pending trial)    Recommendations for Other Services       Precautions / Restrictions Precautions Precautions: Fall Restrictions Weight Bearing Restrictions: No      Mobility  Bed Mobility               General bed mobility comments: Up in chair upon PT arrival.  Transfers Overall transfer level: Needs assistance Equipment used: None Transfers: Sit to/from Stand Sit to Stand: Min guard         General transfer comment: Min guard for saferty. Reaching for UE support in standing.  Ambulation/Gait Ambulation/Gait assistance: Min assist Gait Distance (Feet): 100 Feet Assistive device: None Gait Pattern/deviations: Step-through pattern;Decreased stride length;Narrow base of support;Staggering  left;Staggering right Gait velocity: varying speeds   General Gait Details: Unsteady gait requiring Min A due staggering and imbalance. One instance of knee instability but no buckling. Reaching for wall for support; might do well with RW.  Stairs            Wheelchair Mobility    Modified Rankin (Stroke Patients Only)       Balance Overall balance assessment: Needs assistance Sitting-balance support: Feet supported;No upper extremity supported Sitting balance-Leahy Scale: Good     Standing balance support: During functional activity Standing balance-Leahy Scale: Fair Standing balance comment: Able to perform static standing without UE support but reaching for UE support during dynamic tasks/walking.                             Pertinent Vitals/Pain Pain Assessment: No/denies pain    Home Living Family/patient expects to be discharged to:: Group home Living Arrangements: Group Home Available Help at Discharge: Friend(s) Type of Home: House Home Access: Level entry     Home Layout: One level Home Equipment: None Additional Comments: Difficult historian    Prior Function Level of Independence: Independent         Comments: does ADLs and IADLs.     Hand Dominance   Dominant Hand: Right    Extremity/Trunk Assessment   Upper Extremity Assessment Upper Extremity Assessment: Defer to OT evaluation    Lower Extremity Assessment Lower Extremity Assessment: Generalized weakness (but functional)    Cervical / Trunk Assessment Cervical / Trunk Assessment: Normal  Communication   Communication: No difficulties  Cognition Arousal/Alertness: Awake/alert Behavior During Therapy: WFL for tasks assessed/performed Overall Cognitive Status: No family/caregiver present to determine  baseline cognitive functioning                                 General Comments: Oriented to self, place and date. Does not know why he is here. Not  answering questions appropriately. Mentions he has not slept in 3 years since he does not have his CPAP anymore. Requires repetition to follow commands.      General Comments      Exercises     Assessment/Plan    PT Assessment Patient needs continued PT services  PT Problem List Decreased mobility;Decreased strength;Decreased safety awareness;Decreased balance;Decreased knowledge of use of DME;Decreased cognition       PT Treatment Interventions DME instruction;Therapeutic activities;Gait training;Therapeutic exercise;Patient/family education;Balance training;Functional mobility training    PT Goals (Current goals can be found in the Care Plan section)  Acute Rehab PT Goals Patient Stated Goal: go home PT Goal Formulation: With patient Time For Goal Achievement: 02/24/20 Potential to Achieve Goals: Good    Frequency Min 3X/week   Barriers to discharge Decreased caregiver support      Co-evaluation               AM-PAC PT "6 Clicks" Mobility  Outcome Measure Help needed turning from your back to your side while in a flat bed without using bedrails?: None Help needed moving from lying on your back to sitting on the side of a flat bed without using bedrails?: A Little Help needed moving to and from a bed to a chair (including a wheelchair)?: A Little Help needed standing up from a chair using your arms (e.g., wheelchair or bedside chair)?: A Little Help needed to walk in hospital room?: A Little Help needed climbing 3-5 steps with a railing? : A Little 6 Click Score: 19    End of Session Equipment Utilized During Treatment: Gait belt Activity Tolerance: Patient tolerated treatment well Patient left: in chair;with call bell/phone within reach Nurse Communication: Mobility status PT Visit Diagnosis: Difficulty in walking, not elsewhere classified (R26.2);Unsteadiness on feet (R26.81)    Time: 0762-2633 PT Time Calculation (min) (ACUTE ONLY): 19 min   Charges:    PT Evaluation $PT Eval Moderate Complexity: 1 Mod          Vale Haven, PT, DPT Acute Rehabilitation Services Pager [phone] Office [phone]      [name] 02/10/2020, 3:33 PM

## 2020-02-10 NOTE — Progress Notes (Signed)
Called phlebotomy regarding blood gas, venous order  Phlebotomy tech aware and to come to bedside shortly  Pt not in distress at this time  Will continue to monitor

## 2020-02-10 NOTE — Procedures (Signed)
History: 59 year old male being evaluated for seizures  Sedation: None  Technique: This is a 21 channel routine scalp EEG performed at the bedside with bipolar and monopolar montages arranged in accordance to the international 10/20 system of electrode placement. One channel was dedicated to EKG recording.    Background: The background consists of intermixed alpha and beta activities. There is a well defined posterior dominant rhythm of eight hz that attenuates with eye opening.  He is making chewing type movements throughout most of the study, but no clear EEG changes seen underneath.  Hyperventilation: An increase in slow activity is seen associated with hyperventilation Photic stimulation: Physiologic driving is now performed  EEG Abnormalities: The brief part of the EEG seen without chewing artifact had no abnormalities  Clinical Interpretation: This EEG is slightly limited due to the patient's chewing throughout the study, but given this limitation no clear abnormalities were seen. There was no seizure or seizure predisposition recorded on this study. Please note that lack of epileptiform activity on EEG does not preclude the possibility of epilepsy.   Ritta Slot, MD [facility] [phone]  If 7pm- 7am, please page neurology on call as listed in AMION.

## 2020-02-10 NOTE — Progress Notes (Signed)
PROGRESS NOTE    AJENE CARCHI  ZOX:[id number] DOB: Dec 30, 1960 DOA: 02/09/2020 PCP: Salli Real, MD    Brief Narrative:  Ronald Roman is a 59 year old male with past medical history notable for seizure disorder, type 2 diabetes mellitus, essential hypertension, hyperlipidemia, paranoid schizophrenia, OSA who presented via EMS from his group home. He was apparently eating dinner with sudden onset unresponsiveness.  Patient is currently a resident at a group home which apparently is not licensed by the county.  In the ED, patient was drowsy, confused and only responsive to painful stimuli. BP 108/72, HR 90, blood sugar 114. Sodium 141, potassium 3.0, creatinine 0.82, BUN 21. WBC 6.6, hemoglobin 8.8, ammonia level 19, lactic acid 1.8. EtOH level less than 10, salicylate level less than 7.0. TSH 0.888. He was given multiple doses of Narcan without improvement in mentation; although no history of opiate/substance abuse. Also concern for possible seizure with history of seizure disorder. Neurology was consulted, patient was given Keppra IV. TRH consulted for admission.   Assessment & Plan:   Principal Problem:   Seizure disorder (HCC) Active Problems:   Diabetes mellitus without complication (HCC)   Hypertension   Paranoid schizophrenia (HCC)   AKI (acute kidney injury) (HCC)   Altered mental status   Acute metabolic encephalopathy Hx seizure disorder Patient presenting from group home after period of unresponsiveness. Unclear etiology. TSH 0.888. Ammonia level 19. Lactic acid 1.8. EtOH level less than 10. UDS negative. Tylenol and salicylate level within normal limits. Valproic acid level slightly low at 49. CT head/C-spine negative for acute intracranial normality and no fracture/subluxation. MR brain with no acute findings but notable progression of nonspecific white matter changes since 2016. Patient with history of seizure disorder, query seizure activity. --Neurology following,  appreciate assistance --Pending EEG --Continue Depakote ER 1250 mg p.o. daily --Continue Keppra 500 g IV every 12 hours --Continue to monitor on telemetry --Await further neurology recommendations  Acute renal failure: Resolved Patient creatinine 1.28 on admission, likely prerenal azotemia. Received IV fluid hydration on presentation with resolution of AKI. --Repeat BMP in a.m.  Type 2 diabetes mellitus Hemoglobin A1c 5.5, well controlled. On Farxiga 10 mg p.o. daily, Metformin 1000 mg p.o. twice daily, Lantus 20 units of tensely daily at home --Hold oral hypoglycemic agents while inpatient --Continue home Lantus 20 units subcutaneously nightly --Moderate dose insulin sliding scale for coverage --CBGs qAC/HS  Essential hypertension --Continue lisinopril 20 mg p.o. daily  Paranoid schizophrenia --Clozaril 150 mg p.o. daily --Depakote ER 1250 mg p.o. daily --Seroquel to 50 mg every morning, 100 g nightly  Tobacco use disorder: --Nicotine patch   DVT prophylaxis: Lovenox Code Status: Full code Family Communication: Updated patient extensively at bedside, no family present. From group home.  Disposition Plan:  Status is: Observation  The patient remains OBS appropriate and will d/c before 2 midnights. Social work may need to seek alternate living situation as patient apparently currently residing in a group home that is unlicensed by the county.  Dispo: The patient is from: Group home              Anticipated d/c is to: Group home              Anticipated d/c date is: 2 days              Patient currently is not medically stable to d/c.    Consultants:   None  Procedures:   None  Antimicrobials:   None   Subjective:  Patient seen and examined bedside, resting comfortably. Alert and oriented. No complaints this morning. Unable to ascertain events leading to hospitalization. Awaiting EEG. Denies headache, no visual changes, no chest pain, no palpitations, no  shortness of breath, no fever/chills/night sweats, no nausea/vomiting/diarrhea, no chest pain, no palpitations, no abdominal pain, no weakness, no fatigue. No acute events overnight per nursing staff.  Objective: Vitals:   02/09/20 2109 02/09/20 2243 02/10/20 0503 02/10/20 0816  BP: 132/83 119/66 109/69   Pulse: 64 69 68   Resp: 16 16 16    Temp:  (!) 97.5 F (36.4 C) 98.4 F (36.9 C)   TempSrc:  Oral Oral   SpO2: 100% 98% 99%   Weight:    87.5 kg  Height:    5\' 9"  (1.753 m)    Intake/Output Summary (Last 24 hours) at 02/10/2020 1106 Last data filed at 02/10/2020 0817 Gross per 24 hour  Intake 3482.74 ml  Output 1100 ml  Net 2382.74 ml   Filed Weights   02/10/20 0816  Weight: 87.5 kg    Examination:  General exam: Appears calm and comfortable  Respiratory system: Clear to auscultation. Respiratory effort normal. Cardiovascular system: S1 & S2 heard, RRR. No JVD, murmurs, rubs, gallops or clicks. No pedal edema. Gastrointestinal system: Abdomen is nondistended, soft and nontender. No organomegaly or masses felt. Normal bowel sounds heard. Central nervous system: Alert and oriented. No focal neurological deficits. Extremities: Symmetric 5 x 5 power. Skin: No rashes, lesions or ulcers Psychiatry: Judgement and insight appear normal. Mood & affect appropriate.     Data Reviewed: I have personally reviewed following labs and imaging studies  CBC: Recent Labs  Lab 02/09/20 1421 02/09/20 1459 02/09/20 1620 02/10/20 0047  WBC 6.6  --  10.4 9.2  NEUTROABS  --   --  6.9  --   HGB 8.8* 13.3 13.0 12.9*  HCT 29.4* 39.0 42.6 40.1  MCV 89.9  --  86.6 85.1  PLT 109*  --  223 202   Basic Metabolic Panel: Recent Labs  Lab 02/09/20 1421 02/09/20 1459 02/09/20 1620 02/10/20 0047  NA 141 140 139 139  K 3.0* 5.2* 4.6 4.3  CL 123*  --  111 111  CO2 10*  --  20* 17*  GLUCOSE 86  --  100* 156*  BUN 21*  --  28* 22*  CREATININE 0.82  --  1.28* 1.06  CALCIUM 5.3*  --  8.9  8.9   GFR: Estimated Creatinine Clearance: 82.1 mL/min (by C-G formula based on SCr of 1.06 mg/dL). Liver Function Tests: Recent Labs  Lab 02/09/20 1620  AST 16  ALT 11  ALKPHOS 59  BILITOT 0.5  PROT 5.9*  ALBUMIN 3.0*   No results for input(s): LIPASE, AMYLASE in the last 168 hours. Recent Labs  Lab 02/09/20 1439  AMMONIA 19   Coagulation Profile: No results for input(s): INR, PROTIME in the last 168 hours. Cardiac Enzymes: No results for input(s): CKTOTAL, CKMB, CKMBINDEX, TROPONINI in the last 168 hours. BNP (last 3 results) No results for input(s): PROBNP in the last 8760 hours. HbA1C: Recent Labs    02/10/20 0047  HGBA1C 5.5   CBG: Recent Labs  Lab 02/09/20 1426 02/09/20 2242 02/09/20 2258 02/10/20 0619  GLUCAP 123* 68* 76 88   Lipid Profile: No results for input(s): CHOL, HDL, LDLCALC, TRIG, CHOLHDL, LDLDIRECT in the last 72 hours. Thyroid Function Tests: Recent Labs    02/10/20 0047  TSH 0.888   Anemia Panel: No results  for input(s): VITAMINB12, FOLATE, FERRITIN, TIBC, IRON, RETICCTPCT in the last 72 hours. Sepsis Labs: Recent Labs  Lab 02/09/20 1439 02/09/20 1620  LATICACIDVEN 1.8 2.5*    Recent Results (from the past 240 hour(s))  SARS Coronavirus 2 by RT PCR (hospital order, performed in [facility] hospital lab) Nasopharyngeal Nasopharyngeal Swab     Status: None   Collection Time: 02/09/20  8:25 PM   Specimen: Nasopharyngeal Swab  Result Value Ref Range Status   SARS Coronavirus 2 NEGATIVE NEGATIVE Final    Comment: (NOTE) SARS-CoV-2 target nucleic acids are NOT DETECTED.  The SARS-CoV-2 RNA is generally detectable in upper and lower respiratory specimens during the acute phase of infection. The lowest concentration of SARS-CoV-2 viral copies this assay can detect is 250 copies / mL. A negative result does not preclude SARS-CoV-2 infection and should not be used as the sole basis for treatment or other patient management decisions.   A negative result may occur with improper specimen collection / handling, submission of specimen other than nasopharyngeal swab, presence of viral mutation(s) within the areas targeted by this assay, and inadequate number of viral copies (<250 copies / mL). A negative result must be combined with clinical observations, patient history, and epidemiological information.  Fact Sheet for Patients:   BoilerBrush.com.cy  Fact Sheet for Healthcare Providers: https://pope.com/  This test is not yet approved or  cleared by the [place] FDA and has been authorized for detection and/or diagnosis of SARS-CoV-2 by FDA under an Emergency Use Authorization (EUA).  This EUA will remain in effect (meaning this test can be used) for the duration of the COVID-19 declaration under Section 564(b)(1) of the Act, 21 U.S.C. section 360bbb-3(b)(1), unless the authorization is terminated or revoked sooner.  Performed at [facility], [address] [place], [name] [address]          Radiology Studies: CT Head Wo Contrast  Result Date: 02/09/2020 CLINICAL DATA:  Change in mental status EXAM: CT HEAD WITHOUT CONTRAST TECHNIQUE: Contiguous axial images were obtained from the base of the skull through the vertex without intravenous contrast. COMPARISON:  None. FINDINGS: Brain: No evidence of acute territorial infarction, hemorrhage, hydrocephalus,extra-axial collection or mass lesion/mass effect. There is dilatation the ventricles and sulci consistent with age-related atrophy. Low-attenuation changes in the deep white matter consistent with small vessel ischemia. Vascular: No hyperdense vessel or unexpected calcification. Skull: The skull is intact. No fracture or focal lesion identified. Sinuses/Orbits: The visualized paranasal sinuses and mastoid air cells are clear. The orbits and globes intact. Other: None Cervical spine: Alignment: There is  straightening of the normal cervical lordosis. Skull base and vertebrae: Visualized skull base is intact. No atlanto-occipital dissociation. The vertebral body heights are well maintained. No fracture or pathologic osseous lesion seen. Soft tissues and spinal canal: The visualized paraspinal soft tissues are unremarkable. No prevertebral soft tissue swelling is seen. The spinal canal is grossly unremarkable, no large epidural collection or significant canal narrowing. Disc levels: Mild disc height loss with anterior osteophytes is most notable at C6-C7 mild bilateral neural foraminal narrowing. Upper chest: The lung apices are clear. Thoracic inlet is within normal limits. Other: None IMPRESSION: No acute intracranial abnormality. Findings consistent with age related atrophy and chronic small vessel ischemia No acute fracture or malalignment of the spine. Electronically Signed   By: Jonna Clark M.D.   On: 02/09/2020 15:39   CT Cervical Spine Wo Contrast  Result Date: 02/09/2020 CLINICAL DATA:  Change in mental  status EXAM: CT HEAD WITHOUT CONTRAST TECHNIQUE: Contiguous axial images were obtained from the base of the skull through the vertex without intravenous contrast. COMPARISON:  None. FINDINGS: Brain: No evidence of acute territorial infarction, hemorrhage, hydrocephalus,extra-axial collection or mass lesion/mass effect. There is dilatation the ventricles and sulci consistent with age-related atrophy. Low-attenuation changes in the deep white matter consistent with small vessel ischemia. Vascular: No hyperdense vessel or unexpected calcification. Skull: The skull is intact. No fracture or focal lesion identified. Sinuses/Orbits: The visualized paranasal sinuses and mastoid air cells are clear. The orbits and globes intact. Other: None Cervical spine: Alignment: There is straightening of the normal cervical lordosis. Skull base and vertebrae: Visualized skull base is intact. No atlanto-occipital  dissociation. The vertebral body heights are well maintained. No fracture or pathologic osseous lesion seen. Soft tissues and spinal canal: The visualized paraspinal soft tissues are unremarkable. No prevertebral soft tissue swelling is seen. The spinal canal is grossly unremarkable, no large epidural collection or significant canal narrowing. Disc levels: Mild disc height loss with anterior osteophytes is most notable at C6-C7 mild bilateral neural foraminal narrowing. Upper chest: The lung apices are clear. Thoracic inlet is within normal limits. Other: None IMPRESSION: No acute intracranial abnormality. Findings consistent with age related atrophy and chronic small vessel ischemia No acute fracture or malalignment of the spine. Electronically Signed   By: Jonna ClarkBindu  Avutu M.D.   On: 02/09/2020 15:39   MR BRAIN WO CONTRAST  Result Date: 02/09/2020 CLINICAL DATA:  59 year old male with decreased mental status. Delirium. EXAM: MRI HEAD WITHOUT CONTRAST TECHNIQUE: Multiplanar, multiecho pulse sequences of the brain and surrounding structures were obtained without intravenous contrast. COMPARISON:  Head and cervical spine CT earlier today. Brain MRI 02/06/2015. FINDINGS: Brain: No restricted diffusion to suggest acute infarction. No midline shift, mass effect, evidence of mass lesion, ventriculomegaly, extra-axial collection or acute intracranial hemorrhage. Cervicomedullary junction and pituitary are within normal limits. Largely stable and normal for age gray and white matter signal throughout the brain. Periatrial white matter T2 and FLAIR hyperintensity has mildly progressed and is now greater on the right. But there is no associated cortical encephalomalacia or chronic cerebral blood products. White matter signal elsewhere, the deep gray nuclei, brainstem and cerebellum are unremarkable. Vascular: Major intracranial vascular flow voids are stable since 2016. Skull and upper cervical spine: Negative visible  cervical spine, bone marrow signal. Sinuses/Orbits: Disconjugate gaze. Otherwise negative orbits. Paranasal Visualized paranasal sinuses and mastoids are stable and well pneumatized. Other: Visible internal auditory structures appear normal. Small T2 hyperintense areas in both parotid glands (series 14, image 3) have not significantly changed since 2016 and appear inconsequential. Chronic vertex scalp soft tissue scarring. IMPRESSION: 1. No acute intracranial abnormality. 2. Mild progression of nonspecific periventricular white matter signal changes since 2016. Otherwise stable and negative for age noncontrast MRI appearance of the brain. Electronically Signed   By: Odessa FlemingH  Hall M.D.   On: 02/09/2020 22:37   DG Chest Portable 1 View  Result Date: 02/09/2020 CLINICAL DATA:  Altered mental status EXAM: PORTABLE CHEST 1 VIEW COMPARISON:  05/30/2016 FINDINGS: The heart size and mediastinal contours are within normal limits. Both lungs are clear. The visualized skeletal structures are unremarkable. IMPRESSION: No acute abnormality of the lungs in AP portable projection. Electronically Signed   By: Lauralyn PrimesAlex  Bibbey M.D.   On: 02/09/2020 15:08        Scheduled Meds: . aspirin EC  81 mg Oral Daily  . cloZAPine  150 mg Oral QPC  breakfast  . dapagliflozin propanediol  10 mg Oral QPC breakfast  . divalproex  1,250 mg Oral QHS  . enoxaparin (LOVENOX) injection  40 mg Subcutaneous Q24H  . insulin aspart  0-15 Units Subcutaneous TID WC  . insulin aspart  0-5 Units Subcutaneous QHS  . insulin glargine  20 Units Subcutaneous QHS  . lisinopril  20 mg Oral Daily  . metFORMIN  1,000 mg Oral BID WC  . nicotine  14 mg Transdermal Daily  . QUEtiapine  100 mg Oral QHS  . QUEtiapine  50 mg Oral BH-q7a  . sodium chloride flush  3 mL Intravenous Once   Continuous Infusions: . lactated ringers 100 mL/hr at 02/10/20 0616  . levETIRAcetam 500 mg (02/10/20 0620)     LOS: 0 days    Time spent: 38 minutes spent on chart  review, discussion with nursing staff, consultants, updating family and interview/physical exam; more than 50% of that time was spent in counseling and/or coordination of care.    Alvira Philips [place], DO [facility] Available via Epic secure chat 7am-7pm After these hours, please refer to coverage provider listed on [email or web address] 02/10/2020, 11:06 AM

## 2020-02-10 NOTE — Progress Notes (Signed)
Attempted ABG multiple times without success. RN and MD aware.

## 2020-02-10 NOTE — Progress Notes (Addendum)
EEG complete - results pending 

## 2020-02-11 DIAGNOSIS — Z79899 Other long term (current) drug therapy: Secondary | ICD-10-CM | POA: Diagnosis not present

## 2020-02-11 DIAGNOSIS — I10 Essential (primary) hypertension: Secondary | ICD-10-CM | POA: Diagnosis present

## 2020-02-11 DIAGNOSIS — G4733 Obstructive sleep apnea (adult) (pediatric): Secondary | ICD-10-CM | POA: Diagnosis present

## 2020-02-11 DIAGNOSIS — Z20822 Contact with and (suspected) exposure to covid-19: Secondary | ICD-10-CM | POA: Diagnosis present

## 2020-02-11 DIAGNOSIS — N179 Acute kidney failure, unspecified: Secondary | ICD-10-CM | POA: Diagnosis present

## 2020-02-11 DIAGNOSIS — G9341 Metabolic encephalopathy: Secondary | ICD-10-CM | POA: Diagnosis present

## 2020-02-11 DIAGNOSIS — Z888 Allergy status to other drugs, medicaments and biological substances status: Secondary | ICD-10-CM | POA: Diagnosis not present

## 2020-02-11 DIAGNOSIS — E785 Hyperlipidemia, unspecified: Secondary | ICD-10-CM | POA: Diagnosis present

## 2020-02-11 DIAGNOSIS — E860 Dehydration: Secondary | ICD-10-CM | POA: Diagnosis present

## 2020-02-11 DIAGNOSIS — Z880 Allergy status to penicillin: Secondary | ICD-10-CM | POA: Diagnosis not present

## 2020-02-11 DIAGNOSIS — F329 Major depressive disorder, single episode, unspecified: Secondary | ICD-10-CM | POA: Diagnosis present

## 2020-02-11 DIAGNOSIS — Z794 Long term (current) use of insulin: Secondary | ICD-10-CM | POA: Diagnosis not present

## 2020-02-11 DIAGNOSIS — F17210 Nicotine dependence, cigarettes, uncomplicated: Secondary | ICD-10-CM | POA: Diagnosis present

## 2020-02-11 DIAGNOSIS — K219 Gastro-esophageal reflux disease without esophagitis: Secondary | ICD-10-CM | POA: Diagnosis present

## 2020-02-11 DIAGNOSIS — E119 Type 2 diabetes mellitus without complications: Secondary | ICD-10-CM | POA: Diagnosis present

## 2020-02-11 DIAGNOSIS — E875 Hyperkalemia: Secondary | ICD-10-CM | POA: Diagnosis present

## 2020-02-11 DIAGNOSIS — I959 Hypotension, unspecified: Secondary | ICD-10-CM | POA: Diagnosis present

## 2020-02-11 DIAGNOSIS — G40909 Epilepsy, unspecified, not intractable, without status epilepticus: Secondary | ICD-10-CM | POA: Diagnosis not present

## 2020-02-11 DIAGNOSIS — F200 Paranoid schizophrenia: Secondary | ICD-10-CM | POA: Diagnosis present

## 2020-02-11 DIAGNOSIS — Z87442 Personal history of urinary calculi: Secondary | ICD-10-CM | POA: Diagnosis not present

## 2020-02-11 LAB — BASIC METABOLIC PANEL
Anion gap: 7 (ref 5–15)
Anion gap: 10 (ref 5–15)
BUN: 22 mg/dL — ABNORMAL HIGH (ref 6–20)
BUN: 19 mg/dL (ref 6–20)
CO2: 23 mmol/L (ref 22–32)
CO2: 22 mmol/L (ref 22–32)
Calcium: 9.2 mg/dL (ref 8.9–10.3)
Calcium: 9.6 mg/dL (ref 8.9–10.3)
Chloride: 109 mmol/L (ref 98–111)
Chloride: 107 mmol/L (ref 98–111)
Creatinine, Ser: 1.21 mg/dL (ref 0.61–1.24)
Creatinine, Ser: 1.19 mg/dL (ref 0.61–1.24)
GFR calc Af Amer: >60 mL/min (ref >60)
GFR calc Af Amer: >60 mL/min (ref >60)
GFR calc non Af Amer: >60 mL/min (ref >60)
GFR calc non Af Amer: >60 mL/min (ref >60)
Glucose, Bld: 88 mg/dL (ref 70–99)
Glucose, Bld: 146 mg/dL — ABNORMAL HIGH (ref 70–99)
Potassium: 5.8 mmol/L — ABNORMAL HIGH (ref 3.5–5.1)
Potassium: 4.2 mmol/L (ref 3.5–5.1)
Sodium: 139 mmol/L (ref 135–145)
Sodium: 139 mmol/L (ref 135–145)

## 2020-02-11 LAB — MAGNESIUM: Magnesium: 1.6 mg/dL — ABNORMAL LOW (ref 1.7–2.4)

## 2020-02-11 LAB — GLUCOSE, CAPILLARY
Glucose-Capillary: 79 mg/dL (ref 70–99)
Glucose-Capillary: 151 mg/dL — ABNORMAL HIGH (ref 70–99)
Glucose-Capillary: 98 mg/dL (ref 70–99)
Glucose-Capillary: 67 mg/dL — ABNORMAL LOW (ref 70–99)
Glucose-Capillary: 106 mg/dL — ABNORMAL HIGH (ref 70–99)
Glucose-Capillary: 136 mg/dL — ABNORMAL HIGH (ref 70–99)

## 2020-02-11 MED ORDER — INSULIN ASPART 100 UNIT/ML ~~LOC~~ SOLN: 5.0000 [IU] | Freq: Once | SUBCUTANEOUS | Status: DC

## 2020-02-11 MED ORDER — DEXTROSE 50 % IV SOLN
1.0000 | Freq: Once | INTRAVENOUS | Status: DC
  Filled 2020-02-11: qty 50

## 2020-02-11 MED ORDER — SODIUM CHLORIDE 0.9 % IV SOLN: INTRAVENOUS | Status: DC

## 2020-02-11 MED ORDER — MAGNESIUM SULFATE 2 GM/50ML IV SOLN
2.0000 g | Freq: Once | INTRAVENOUS | Status: AC
  Administered 2020-02-11: 2 g via INTRAVENOUS
  Filled 2020-02-11: qty 50

## 2020-02-11 NOTE — Progress Notes (Signed)
PROGRESS NOTE    Ronald Roman  UYQ:[id number] DOB: [birth year] DOA: 02/09/2020 PCP: Salli Real, MD    Brief Narrative:  Ronald Roman is a 59 year old male with past medical history notable for seizure disorder, type 2 diabetes mellitus, essential hypertension, hyperlipidemia, paranoid schizophrenia, OSA who presented via EMS from his group home. He was apparently eating dinner with sudden onset unresponsiveness.  Patient is currently a resident at a group home which apparently is not licensed by the county.  In the ED, patient was drowsy, confused and only responsive to painful stimuli. BP 108/72, HR 90, blood sugar 114. Sodium 141, potassium 3.0, creatinine 0.82, BUN 21. WBC 6.6, hemoglobin 8.8, ammonia level 19, lactic acid 1.8. EtOH level less than 10, salicylate level less than 7.0. TSH 0.888. He was given multiple doses of Narcan without improvement in mentation; although no history of opiate/substance abuse. Also concern for possible seizure with history of seizure disorder. Neurology was consulted, patient was given Keppra IV. TRH consulted for admission.   Assessment & Plan:   Principal Problem:   Seizure disorder (HCC) Active Problems:   Diabetes mellitus without complication (HCC)   Hypertension   Paranoid schizophrenia (HCC)   AKI (acute kidney injury) (HCC)   Altered mental status   Acute metabolic encephalopathy Hx seizure disorder Patient presenting from group home after period of unresponsiveness. Unclear etiology. TSH 0.888. Ammonia level 19. Lactic acid 1.8. EtOH level less than 10. UDS negative. Tylenol and salicylate level within normal limits. Valproic acid level slightly low at 49. CT head/C-spine negative for acute intracranial normality and no fracture/subluxation. MR brain with no acute findings but notable progression of nonspecific white matter changes since 2016. Patient with history of seizure disorder, query seizure activity.  EEG with no apparent  seizure activity or epileptiform discharges. --Depakote ER increased to 1500 mg p.o. daily by neurology  Acute renal failure:  Patient creatinine 1.28 on admission, likely prerenal azotemia. --NS at 116mL/hr --continue to encourage increased oral intake --repeat BMP in am  Hyperkalemia Potassium 5.8 this morning.  No concerning findings including peaked T waves on telemetry this morning. --IV fluid hydration --Repeat BMP in the a.m. --Continue to monitor on telemetry  Hypomagnesemia Magnesium 1.6 this morning, will replete. --Follow-up electrolytes to include magnesium in the a.m.  Type 2 diabetes mellitus Hemoglobin A1c 5.5, well controlled. On Farxiga 10 mg p.o. daily, Metformin 1000 mg p.o. twice daily, Lantus 20 units of tensely daily at home --Hold oral hypoglycemic agents while inpatient --Continue home Lantus 20 units subcutaneously nightly --Moderate dose insulin sliding scale for coverage --CBGs qAC/HS  Essential hypertension --Continue lisinopril 20 mg p.o. daily  Paranoid schizophrenia --Clozaril 150 mg p.o. daily --Depakote ER 1500 mg p.o. daily --Seroquel 50 mg qAM and 100 mg qHS  Tobacco use disorder: --Nicotine patch   DVT prophylaxis: Lovenox Code Status: Full code Family Communication: Updated patient extensively at bedside, no family present. From group home.  Disposition Plan:  Status is: Observation  The patient remains OBS appropriate and will d/c before 2 midnights. Social work may need to seek alternate living situation as patient apparently currently residing in a group home that is unlicensed; social work to evaluate for discharge plans.  Dispo: The patient is from: Group home              Anticipated d/c is to: Group home              Anticipated d/c date is: 1 day  Patient currently is not medically stable to d/c.  Consultants:   Neurology  Procedures:   EEG  Antimicrobials:   None   Subjective: Patient seen and  examined bedside, resting comfortably.  Sleeping but arousable.  Complains of fatigue.  No other concerns or questions.  EEG negative, neurology increased Depakote yesterday.  Denies headache, no visual changes, no chest pain, no palpitations, no shortness of breath, no fever/chills/night sweats, no nausea/vomiting/diarrhea, no chest pain, no palpitations, no abdominal pain, no weakness, no fatigue. No acute events overnight per nursing staff.  Objective: Vitals:   02/10/20 1803 02/10/20 2229 02/11/20 0055 02/11/20 0521  BP: 121/74 115/75 125/87 123/87  Pulse: 93 92 89 89  Resp: 17 16 17 16   Temp:  98.9 F (37.2 C) 98.4 F (36.9 C) 98.7 F (37.1 C)  TempSrc:  Oral Oral Oral  SpO2: 100% 94% 98% 99%  Weight:      Height:        Intake/Output Summary (Last 24 hours) at 02/11/2020 1142 Last data filed at 02/11/2020 0619 Gross per 24 hour  Intake 1279.96 ml  Output 1700 ml  Net -420.04 ml   Filed Weights   02/10/20 0816  Weight: 87.5 kg    Examination:  General exam: Appears calm and comfortable  Respiratory system: Clear to auscultation. Respiratory effort normal.  Oxygenating well on room air Cardiovascular system: S1 & S2 heard, RRR. No JVD, murmurs, rubs, gallops or clicks. No pedal edema. Gastrointestinal system: Abdomen is nondistended, soft and nontender. No organomegaly or masses felt. Normal bowel sounds heard. Central nervous system: Alert and oriented. No focal neurological deficits. Extremities: Symmetric 5 x 5 power. Skin: No rashes, lesions or ulcers Psychiatry: Judgement and insight appear poor.  Depressed mood & flat affect.     Data Reviewed: I have personally reviewed following labs and imaging studies  CBC: Recent Labs  Lab 02/09/20 1421 02/09/20 1459 02/09/20 1620 02/10/20 0047  WBC 6.6  --  10.4 9.2  NEUTROABS  --   --  6.9  --   HGB 8.8* 13.3 13.0 12.9*  HCT 29.4* 39.0 42.6 40.1  MCV 89.9  --  86.6 85.1  PLT 109*  --  223 202   Basic Metabolic  Panel: Recent Labs  Lab 02/09/20 1421 02/09/20 1421 02/09/20 1459 02/09/20 1620 02/10/20 0047 02/11/20 0238 02/11/20 0720  NA 141   < > 140 139 139 139 139  K 3.0*   < > 5.2* 4.6 4.3 5.8* 4.2  CL 123*  --   --  111 111 109 107  CO2 10*  --   --  20* 17* 23 22  GLUCOSE 86  --   --  100* 156* 88 146*  BUN 21*  --   --  28* 22* 22* 19  CREATININE 0.82  --   --  1.28* 1.06 1.21 1.19  CALCIUM 5.3*  --   --  8.9 8.9 9.2 9.6  MG  --   --   --   --   --  1.6*  --    < > = values in this interval not displayed.   GFR: Estimated Creatinine Clearance: 73.2 mL/min (by C-G formula based on SCr of 1.19 mg/dL). Liver Function Tests: Recent Labs  Lab 02/09/20 1620  AST 16  ALT 11  ALKPHOS 59  BILITOT 0.5  PROT 5.9*  ALBUMIN 3.0*   No results for input(s): LIPASE, AMYLASE in the last 168 hours. Recent Labs  Lab 02/09/20  1439  AMMONIA 19   Coagulation Profile: No results for input(s): INR, PROTIME in the last 168 hours. Cardiac Enzymes: No results for input(s): CKTOTAL, CKMB, CKMBINDEX, TROPONINI in the last 168 hours. BNP (last 3 results) No results for input(s): PROBNP in the last 8760 hours. HbA1C: Recent Labs    02/10/20 0047  HGBA1C 5.5   CBG: Recent Labs  Lab 02/10/20 1632 02/10/20 2140 02/11/20 0621 02/11/20 0712 02/11/20 1107  GLUCAP 99 154* 79 151* 98   Lipid Profile: No results for input(s): CHOL, HDL, LDLCALC, TRIG, CHOLHDL, LDLDIRECT in the last 72 hours. Thyroid Function Tests: Recent Labs    02/10/20 0047  TSH 0.888   Anemia Panel: No results for input(s): VITAMINB12, FOLATE, FERRITIN, TIBC, IRON, RETICCTPCT in the last 72 hours. Sepsis Labs: Recent Labs  Lab 02/09/20 1439 02/09/20 1620  LATICACIDVEN 1.8 2.5*    Recent Results (from the past 240 hour(s))  SARS Coronavirus 2 by RT PCR (hospital order, performed in [facility] lab) Nasopharyngeal Nasopharyngeal Swab     Status: None   Collection Time: 02/09/20  8:25 PM   Specimen:  Nasopharyngeal Swab  Result Value Ref Range Status   SARS Coronavirus 2 NEGATIVE NEGATIVE Final    Comment: (NOTE) SARS-CoV-2 target nucleic acids are NOT DETECTED.  The SARS-CoV-2 RNA is generally detectable in upper and lower respiratory specimens during the acute phase of infection. The lowest concentration of SARS-CoV-2 viral copies this assay can detect is 250 copies / mL. A negative result does not preclude SARS-CoV-2 infection and should not be used as the sole basis for treatment or other patient management decisions.  A negative result may occur with improper specimen collection / handling, submission of specimen other than nasopharyngeal swab, presence of viral mutation(s) within the areas targeted by this assay, and inadequate number of viral copies (<250 copies / mL). A negative result must be combined with clinical observations, patient history, and epidemiological information.  Fact Sheet for Patients:   BoilerBrush.com.cyhttps://www.fda.gov/media/136312/download  Fact Sheet for Healthcare Providers: https://pope.com/https://www.fda.gov/media/136313/download  This test is not yet approved or  cleared by the [place] FDA and has been authorized for detection and/or diagnosis of SARS-CoV-2 by FDA under an Emergency Use Authorization (EUA).  This EUA will remain in effect (meaning this test can be used) for the duration of the COVID-19 declaration under Section 564(b)(1) of the Act, 21 U.S.C. section 360bbb-3(b)(1), unless the authorization is terminated or revoked sooner.  Performed at [facility], [address] [place], [place] [address]          Radiology Studies: EEG  Result Date: 02/10/2020 Rejeana BrockKirkpatrick, McNeill P, MD     02/10/2020  5:59 PM History: 59 year old male being evaluated for seizures Sedation: None Technique: This is a 21 channel routine scalp EEG performed at the bedside with bipolar and monopolar montages arranged in accordance to the international 10/20 system of  electrode placement. One channel was dedicated to EKG recording. Background: The background consists of intermixed alpha and beta activities. There is a well defined posterior dominant rhythm of eight hz that attenuates with eye opening.  He is making chewing type movements throughout most of the study, but no clear EEG changes seen underneath. Hyperventilation: An increase in slow activity is seen associated with hyperventilation Photic stimulation: Physiologic driving is now performed EEG Abnormalities: The brief part of the EEG seen without chewing artifact had no abnormalities Clinical Interpretation: This EEG is slightly limited due to the patient's chewing throughout  the study, but given this limitation no clear abnormalities were seen. There was no seizure or seizure predisposition recorded on this study. Please note that lack of epileptiform activity on EEG does not preclude the possibility of epilepsy. Ritta Slot, MD [facility] [phone] If 7pm- 7am, please page neurology on call as listed in AMION.   CT Head Wo Contrast  Result Date: 02/09/2020 CLINICAL DATA:  Change in mental status EXAM: CT HEAD WITHOUT CONTRAST TECHNIQUE: Contiguous axial images were obtained from the base of the skull through the vertex without intravenous contrast. COMPARISON:  None. FINDINGS: Brain: No evidence of acute territorial infarction, hemorrhage, hydrocephalus,extra-axial collection or mass lesion/mass effect. There is dilatation the ventricles and sulci consistent with age-related atrophy. Low-attenuation changes in the deep white matter consistent with small vessel ischemia. Vascular: No hyperdense vessel or unexpected calcification. Skull: The skull is intact. No fracture or focal lesion identified. Sinuses/Orbits: The visualized paranasal sinuses and mastoid air cells are clear. The orbits and globes intact. Other: None Cervical spine: Alignment: There is straightening of the normal cervical  lordosis. Skull base and vertebrae: Visualized skull base is intact. No atlanto-occipital dissociation. The vertebral body heights are well maintained. No fracture or pathologic osseous lesion seen. Soft tissues and spinal canal: The visualized paraspinal soft tissues are unremarkable. No prevertebral soft tissue swelling is seen. The spinal canal is grossly unremarkable, no large epidural collection or significant canal narrowing. Disc levels: Mild disc height loss with anterior osteophytes is most notable at C6-C7 mild bilateral neural foraminal narrowing. Upper chest: The lung apices are clear. Thoracic inlet is within normal limits. Other: None IMPRESSION: No acute intracranial abnormality. Findings consistent with age related atrophy and chronic small vessel ischemia No acute fracture or malalignment of the spine. Electronically Signed   By: Jonna Clark M.D.   On: 02/09/2020 15:39   CT Cervical Spine Wo Contrast  Result Date: 02/09/2020 CLINICAL DATA:  Change in mental status EXAM: CT HEAD WITHOUT CONTRAST TECHNIQUE: Contiguous axial images were obtained from the base of the skull through the vertex without intravenous contrast. COMPARISON:  None. FINDINGS: Brain: No evidence of acute territorial infarction, hemorrhage, hydrocephalus,extra-axial collection or mass lesion/mass effect. There is dilatation the ventricles and sulci consistent with age-related atrophy. Low-attenuation changes in the deep white matter consistent with small vessel ischemia. Vascular: No hyperdense vessel or unexpected calcification. Skull: The skull is intact. No fracture or focal lesion identified. Sinuses/Orbits: The visualized paranasal sinuses and mastoid air cells are clear. The orbits and globes intact. Other: None Cervical spine: Alignment: There is straightening of the normal cervical lordosis. Skull base and vertebrae: Visualized skull base is intact. No atlanto-occipital dissociation. The vertebral body heights are well  maintained. No fracture or pathologic osseous lesion seen. Soft tissues and spinal canal: The visualized paraspinal soft tissues are unremarkable. No prevertebral soft tissue swelling is seen. The spinal canal is grossly unremarkable, no large epidural collection or significant canal narrowing. Disc levels: Mild disc height loss with anterior osteophytes is most notable at C6-C7 mild bilateral neural foraminal narrowing. Upper chest: The lung apices are clear. Thoracic inlet is within normal limits. Other: None IMPRESSION: No acute intracranial abnormality. Findings consistent with age related atrophy and chronic small vessel ischemia No acute fracture or malalignment of the spine. Electronically Signed   By: Jonna Clark M.D.   On: 02/09/2020 15:39   MR BRAIN WO CONTRAST  Result Date: 02/09/2020 CLINICAL DATA:  59 year old male with decreased mental status. Delirium. EXAM: MRI HEAD  WITHOUT CONTRAST TECHNIQUE: Multiplanar, multiecho pulse sequences of the brain and surrounding structures were obtained without intravenous contrast. COMPARISON:  Head and cervical spine CT earlier today. Brain MRI 02/06/2015. FINDINGS: Brain: No restricted diffusion to suggest acute infarction. No midline shift, mass effect, evidence of mass lesion, ventriculomegaly, extra-axial collection or acute intracranial hemorrhage. Cervicomedullary junction and pituitary are within normal limits. Largely stable and normal for age gray and white matter signal throughout the brain. Periatrial white matter T2 and FLAIR hyperintensity has mildly progressed and is now greater on the right. But there is no associated cortical encephalomalacia or chronic cerebral blood products. White matter signal elsewhere, the deep gray nuclei, brainstem and cerebellum are unremarkable. Vascular: Major intracranial vascular flow voids are stable since 2016. Skull and upper cervical spine: Negative visible cervical spine, bone marrow signal. Sinuses/Orbits:  Disconjugate gaze. Otherwise negative orbits. Paranasal Visualized paranasal sinuses and mastoids are stable and well pneumatized. Other: Visible internal auditory structures appear normal. Small T2 hyperintense areas in both parotid glands (series 14, image 3) have not significantly changed since 2016 and appear inconsequential. Chronic vertex scalp soft tissue scarring. IMPRESSION: 1. No acute intracranial abnormality. 2. Mild progression of nonspecific periventricular white matter signal changes since 2016. Otherwise stable and negative for age noncontrast MRI appearance of the brain. Electronically Signed   By: Odessa Fleming M.D.   On: 02/09/2020 22:37   DG Chest Portable 1 View  Result Date: 02/09/2020 CLINICAL DATA:  Altered mental status EXAM: PORTABLE CHEST 1 VIEW COMPARISON:  05/30/2016 FINDINGS: The heart size and mediastinal contours are within normal limits. Both lungs are clear. The visualized skeletal structures are unremarkable. IMPRESSION: No acute abnormality of the lungs in AP portable projection. Electronically Signed   By: Lauralyn Primes M.D.   On: 02/09/2020 15:08        Scheduled Meds: . aspirin EC  81 mg Oral Daily  . cloZAPine  150 mg Oral QPC breakfast  . dextrose  1 ampule Intravenous Once  . divalproex  1,500 mg Oral QHS  . enoxaparin (LOVENOX) injection  40 mg Subcutaneous Q24H  . insulin aspart  0-15 Units Subcutaneous TID WC  . insulin aspart  0-5 Units Subcutaneous QHS  . insulin aspart  5 Units Subcutaneous Once  . insulin glargine  20 Units Subcutaneous QHS  . nicotine  14 mg Transdermal Daily  . QUEtiapine  100 mg Oral QHS  . QUEtiapine  50 mg Oral BH-q7a  . sodium chloride flush  3 mL Intravenous Once   Continuous Infusions: . sodium chloride 100 mL/hr at 02/11/20 0757     LOS: 0 days    Time spent: 38 minutes spent on chart review, discussion with nursing staff, consultants, updating family and interview/physical exam; more than 50% of that time was spent  in counseling and/or coordination of care.    Alvira Philips [place], DO [facility] Available via Epic secure chat 7am-7pm After these hours, please refer to coverage provider listed on [email or web address] 02/11/2020, 11:42 AM

## 2020-02-11 NOTE — Progress Notes (Signed)
Floor coverage  Labs done at 2:38 AM showing potassium 5.8, normal on previous labs.  Amion was down for a very brief of time overnight.  However, the issue resolved and amion has been up and running smoothly for several hours now.  I did not receive any pages from nursing staff notifying me about the abnormal lab until just recently.  -Stat EKG -Insulin/dextrose -Hold ACE inhibitor -Stat repeat BMP

## 2020-02-11 NOTE — Progress Notes (Signed)
Md paged again about pt's critical K level. MD returned page close to [phone] saying she did not receive pager on time. Patient stable at this time.

## 2020-02-11 NOTE — Evaluation (Signed)
Occupational Therapy Evaluation Patient Details Name: Ronald Roman MRN: [id number] DOB: 05/02/1961 Today's Date: 02/11/2020    History of Present Illness Patient is a 59 y/o male who presents with episode of unresponsiveness, found slumped over when eating. Admitted with acute metabolic encephalopathy vs seizure with posital state. PMH includes paranoid schizophrenia, seizure disorder, HTN, dyslipidemia.   Clinical Impression   PTA patient independent with ADLs, mobility, reports requiring assistance for IADLs.  Admitted for above and limited by problem list below, including impaired balance, decreased activity tolerance, and generalized weakness.  Anticipate cognition is near baseline, but noted decreased awareness, problem solving, recall and safety awareness.  Patient currently requires min guard for transfers, min assist for limited in room mobility using RW, and supervision to min assist for basic ADLs.  Believe he will benefit from continued OT services while admitted to optimize independence, safety with ADLs, mobility but anticipate no further needs after dc home given 24/7 support.     Follow Up Recommendations  No OT follow up;Supervision/Assistance - 24 hour    Equipment Recommendations  3 in 1 bedside commode    Recommendations for Other Services       Precautions / Restrictions Precautions Precautions: Fall Restrictions Weight Bearing Restrictions: No      Mobility Bed Mobility Overal bed mobility: Needs Assistance Bed Mobility: Supine to Sit;Sit to Supine     Supine to sit: Supervision Sit to supine: Supervision   General bed mobility comments: cueing for technique with use of rails but no physical assistance required  Transfers Overall transfer level: Needs assistance Equipment used: None Transfers: Sit to/from Stand Sit to Stand: Min guard         General transfer comment: min guard for safety/balance, cueing for hand placement and safety      Balance Overall balance assessment: Needs assistance Sitting-balance support: No upper extremity supported;Feet supported Sitting balance-Leahy Scale: Good     Standing balance support: During functional activity;Bilateral upper extremity supported;No upper extremity supported Standing balance-Leahy Scale: Fair                             ADL either performed or assessed with clinical judgement   ADL Overall ADL's : Needs assistance/impaired     Grooming: Min guard;Standing   Upper Body Bathing: Set up;Sitting   Lower Body Bathing: Minimal assistance;Sit to/from stand   Upper Body Dressing : Set up;Sitting   Lower Body Dressing: Minimal assistance;Sit to/from stand   Toilet Transfer: Minimal assistance;RW;Ambulation Statistician Details (indicate cue type and reason): simulated in room         Functional mobility during ADLs: Min guard;Rolling walker;Cueing for safety;Cueing for sequencing General ADL Comments: pt limited by decreased safety awareness, impaird balance and cognition      Vision   Vision Assessment?: No apparent visual deficits     Perception     Praxis      Pertinent Vitals/Pain Pain Assessment: No/denies pain     Hand Dominance Right   Extremity/Trunk Assessment Upper Extremity Assessment Upper Extremity Assessment: Generalized weakness   Lower Extremity Assessment Lower Extremity Assessment: Defer to PT evaluation   Cervical / Trunk Assessment Cervical / Trunk Assessment: Normal   Communication Communication Communication: No difficulties   Cognition Arousal/Alertness: Awake/alert Behavior During Therapy: WFL for tasks assessed/performed Overall Cognitive Status: History of cognitive impairments - at baseline  General Comments: pt cooperative, once OOB engages more appropriately. Poor safety awareness, decreased problem solving, recall. (pt walks into bathroom to urinate  after being told about condom cath)    General Comments  utilized RW, patient with poor walker mgmt and decreased safety.  Requires cueing throughout session for safety.    Exercises     Shoulder Instructions      Home Living Family/patient expects to be discharged to:: Group home Living Arrangements: Group Home Available Help at Discharge: Friend(s) Type of Home: House Home Access: Level entry     Home Layout: One level     Bathroom Shower/Tub: Producer, television/film/video: Standard     Home Equipment: Shower seat   Additional Comments: questionable historian      Prior Functioning/Environment Level of Independence: Independent        Comments: reports independent mobility and ADLs, assist required for meals and meds        OT Problem List: Decreased strength;Impaired balance (sitting and/or standing);Decreased activity tolerance;Decreased cognition;Decreased safety awareness;Decreased knowledge of use of DME or AE;Decreased knowledge of precautions      OT Treatment/Interventions: Self-care/ADL training;DME and/or AE instruction;Therapeutic activities;Cognitive remediation/compensation;Patient/family education;Balance training;Therapeutic exercise    OT Goals(Current goals can be found in the care plan section) Acute Rehab OT Goals Patient Stated Goal: go home OT Goal Formulation: With patient Time For Goal Achievement: 02/25/20 Potential to Achieve Goals: Good  OT Frequency: Min 2X/week   Barriers to D/C:            Co-evaluation              AM-PAC OT "6 Clicks" Daily Activity     Outcome Measure Help from another person eating meals?: A Little Help from another person taking care of personal grooming?: A Little Help from another person toileting, which includes using toliet, bedpan, or urinal?: A Little Help from another person bathing (including washing, rinsing, drying)?: A Little Help from another person to put on and taking off  regular upper body clothing?: A Little Help from another person to put on and taking off regular lower body clothing?: A Little 6 Click Score: 18   End of Session Equipment Utilized During Treatment: Rolling walker;Gait belt Nurse Communication: Mobility status  Activity Tolerance: Patient tolerated treatment well Patient left: with call bell/phone within reach;in bed;with bed alarm set  OT Visit Diagnosis: Other abnormalities of gait and mobility (R26.89);Muscle weakness (generalized) (M62.81)                Time: 1610-9604 OT Time Calculation (min): 18 min Charges:  OT General Charges $OT Visit: 1 Visit OT Evaluation $OT Eval Moderate Complexity: 1 Mod  Kai Brunner, OT Acute Rehabilitation Services Pager [phone] Office [phone]   Chancy Milroy 02/11/2020, 12:40 PM

## 2020-02-11 NOTE — Progress Notes (Signed)
Patient K is elevated to 5.8, attempted paging MD but amion not available at the moment. Md's secure chat satus is set to unavailable mode by default. Will reach out when the system comes back. Will keep monitoring patient

## 2020-02-12 LAB — GLUCOSE, CAPILLARY
Glucose-Capillary: 72 mg/dL (ref 70–99)
Glucose-Capillary: 122 mg/dL — ABNORMAL HIGH (ref 70–99)

## 2020-02-12 LAB — BASIC METABOLIC PANEL
Anion gap: 8 (ref 5–15)
BUN: 18 mg/dL (ref 6–20)
CO2: 24 mmol/L (ref 22–32)
Calcium: 8.9 mg/dL (ref 8.9–10.3)
Chloride: 110 mmol/L (ref 98–111)
Creatinine, Ser: 1.13 mg/dL (ref 0.61–1.24)
GFR calc Af Amer: >60 mL/min (ref >60)
GFR calc non Af Amer: >60 mL/min (ref >60)
Glucose, Bld: 77 mg/dL (ref 70–99)
Potassium: 4.6 mmol/L (ref 3.5–5.1)
Sodium: 142 mmol/L (ref 135–145)

## 2020-02-12 LAB — MAGNESIUM: Magnesium: 1.8 mg/dL (ref 1.7–2.4)

## 2020-02-12 MED ORDER — ASPIRIN 81 MG PO TBEC: 81.0000 mg | DELAYED_RELEASE_TABLET | Freq: Every day | ORAL | 0 refills | Status: AC

## 2020-02-12 MED ORDER — DIVALPROEX SODIUM ER 500 MG PO TB24
1500.0000 mg | ORAL_TABLET | Freq: Every day | ORAL | 0 refills | Status: DC
Start: 2020-02-12 — End: 2020-05-08

## 2020-02-12 MED FILL — ASPIRIN LOW DOSE 81 MG TBEC: 81 | 30 days supply | Qty: 30 | Fill #0

## 2020-02-12 MED FILL — DIVALPROEX SOD ER 500 MG TA: 500 | 30 days supply | Qty: 90 | Fill #0

## 2020-02-12 NOTE — Discharge Summary (Addendum)
Physician Discharge Summary  Ronald Roman:[id number] DOB: 07-20-1960 DOA: 02/09/2020  PCP: Salli Real, MD  Admit date: 02/09/2020 Discharge date: 02/12/2020  Admitted From: Group home Disposition: Group home  Recommendations for Outpatient Follow-up:  1. Follow up with PCP in 1-2 weeks 2. Increased Depakote to 1500 mg p.o. daily per neurology   Home Health: No Equipment/Devices: None  Discharge Condition: Stable CODE STATUS: Full code Diet recommendation: Heart healthy/consistent carbohydrate diet  History of present illness:  Ronald Roman is a 59 year old male with past medical history notable for seizure disorder, type 2 diabetes mellitus, essential hypertension, hyperlipidemia, paranoid schizophrenia, OSA who presented via EMS from his group home. He was apparently eating dinner with sudden onset unresponsiveness.  Patient is currently a resident at a group home  In the ED, patient was drowsy, confused and only responsive to painful stimuli. BP 108/72, HR 90, blood sugar 114. Sodium 141, potassium 3.0, creatinine 0.82, BUN 21. WBC 6.6, hemoglobin 8.8, ammonia level 19, lactic acid 1.8. EtOH level less than 10, salicylate level less than 7.0. TSH 0.888. He was given multiple doses of Narcan without improvement in mentation; although no history of opiate/substance abuse. Also concern for possible seizure with history of seizure disorder. Neurology was consulted, patient was given Keppra IV. TRH consulted for admission.  Hospital course:  Acute metabolic encephalopathy Hx seizure disorder Patient presenting from group home after period of unresponsiveness. Unclear etiology. TSH 0.888. Ammonia level 19. Lactic acid 1.8. EtOH level less than 10. UDS negative. Tylenol and salicylate level within normal limits. Valproic acid level slightly low at 49. CT head/C-spine negative for acute intracranial normality and no fracture/subluxation. MR brain with no acute findings but notable  progression of nonspecific white matter changes since 2016. Patient with history of seizure disorder, query seizure activity.  EEG with no apparent seizure activity or epileptiform discharges. Depakote ER increased to 1500 mg p.o. daily by neurology.  Outpatient follow-up.  Acute renal failure:  Patient creatinine 1.28 on admission, likely prerenal azotemia.  Patient was given IV fluid hydration with improvement of creatinine which was 1.13 at time of discharge.  Continue encourage increased oral intake.  Hyperkalemia: Resolved Potassium 5.8, etiology likely secondary to dehydration.  Resolved with IV fluid hydration, potassium 4.6 at time of discharge.  Hypomagnesemia: Resolved Repleted during hospitalization  Type 2 diabetes mellitus Hemoglobin A1c 5.5, well controlled. On Farxiga 10 mg p.o. daily, Metformin 1000 mg p.o. twice daily, Lantus 20 units [place] daily at home  Essential hypertension Continue lisinopril 20 mg p.o. daily  Paranoid schizophrenia Clozaril 150 mg p.o. daily, Depakote ER 1500 mg p.o. daily, Seroquel 50 mg qAM and 100 mg qHS  Tobacco use disorder: Encouraged complete cessation  Discharge Diagnoses:  Principal Problem:   Seizure disorder (HCC) Active Problems:   Diabetes mellitus without complication (HCC)   Hypertension   Paranoid schizophrenia [facility])    Discharge Instructions  Discharge Instructions    Call MD for:  difficulty breathing, headache or visual disturbances   Complete by: As directed    Call MD for:  extreme fatigue   Complete by: As directed    Call MD for:  persistant dizziness or light-headedness   Complete by: As directed    Call MD for:  persistant nausea and vomiting   Complete by: As directed    Call MD for:  severe uncontrolled pain   Complete by: As directed    Call MD for:  temperature >100.4   Complete by: As  directed    Diet - low sodium heart healthy   Complete by: As directed    Increase activity slowly   Complete  by: As directed      Allergies as of 02/12/2020      Reactions   Cogentin [benztropine] Other (See Comments)   Per MAR    Penicillins Other (See Comments)   Per MAR - unable to verify patients PCN reaction    Prolixin [fluphenazine] Other (See Comments)   Per MAR       Medication List    STOP taking these medications   traZODone 50 MG tablet Commonly known as: DESYREL   zolpidem 5 MG tablet Commonly known as: AMBIEN     TAKE these medications   acetaminophen 650 MG CR tablet Commonly known as: TYLENOL Take 650 mg by mouth every 4 (four) hours as needed for pain (headache).   albuterol 108 (90 Base) MCG/ACT inhaler Commonly known as: VENTOLIN HFA Inhale 2 puffs into the lungs every 4 (four) hours as needed for wheezing or shortness of breath.   amantadine 100 MG capsule Commonly known as: SYMMETREL Take 1 capsule (100 mg total) by mouth 2 (two) times daily.   aspirin 81 MG EC tablet Take 1 tablet (81 mg total) by mouth daily. Swallow whole. Start taking on: February 13, 2020   cloZAPine 100 MG tablet Commonly known as: CLOZARIL Take 150 mg by mouth daily after breakfast.   divalproex 500 MG 24 hr tablet Commonly known as: DEPAKOTE ER Take 3 tablets (1,500 mg total) by mouth at bedtime. What changed:   medication strength  how much to take  Another medication with the same name was removed. Continue taking this medication, and follow the directions you see here.   Farxiga 10 MG Tabs tablet Generic drug: dapagliflozin propanediol Take 10 mg by mouth daily after breakfast.   hydrOXYzine 25 MG tablet Commonly known as: ATARAX/VISTARIL Take 25 mg by mouth 4 (four) times daily.   insulin glargine 100 UNIT/ML injection Commonly known as: LANTUS Inject 0.2 mLs (20 Units total) into the skin at bedtime. What changed: how much to take   lisinopril 20 MG tablet Commonly known as: ZESTRIL Take 1 tablet (20 mg total) by mouth daily. What changed: when to take  this   metFORMIN 1000 MG tablet Commonly known as: GLUCOPHAGE Take 1,000 mg by mouth 2 (two) times daily with a meal.   QUEtiapine 50 MG tablet Commonly known as: SEROQUEL Take 50 mg by mouth daily after breakfast.   QUEtiapine 100 MG tablet Commonly known as: SEROQUEL Take 100 mg by mouth at bedtime.       Follow-up Information    Salli Real, MD. Schedule an appointment as soon as possible for a visit in 1 week(s).   Specialty: Internal Medicine Contact information: [address] [name] [address] [phone]              Allergies  Allergen Reactions  . Cogentin [Benztropine] Other (See Comments)    Per MAR   . Penicillins Other (See Comments)    Per MAR - unable to verify patients PCN reaction   . Prolixin [Fluphenazine] Other (See Comments)    Per [facility]     Consultations:  Neurology   Procedures/Studies: EEG  Result Date: 02/10/2020 Rejeana Brock, MD     02/10/2020  5:59 PM History: 59 year old male being evaluated for seizures Sedation: None Technique: This is a 21 channel routine scalp EEG performed at the  bedside with bipolar and monopolar montages arranged in accordance to the international 10/20 system of electrode placement. One channel was dedicated to EKG recording. Background: The background consists of intermixed alpha and beta activities. There is a well defined posterior dominant rhythm of eight hz that attenuates with eye opening.  He is making chewing type movements throughout most of the study, but no clear EEG changes seen underneath. Hyperventilation: An increase in slow activity is seen associated with hyperventilation Photic stimulation: Physiologic driving is now performed EEG Abnormalities: The brief part of the EEG seen without chewing artifact had no abnormalities Clinical Interpretation: This EEG is slightly limited due to the patient's chewing throughout the study, but given this limitation no clear abnormalities were seen.  There was no seizure or seizure predisposition recorded on this study. Please note that lack of epileptiform activity on EEG does not preclude the possibility of epilepsy. Ritta SlotMcNeill Kirkpatrick, MD [facility] [phone] If 7pm- 7am, please page neurology on call as listed in AMION.   CT Head Wo Contrast  Result Date: 02/09/2020 CLINICAL DATA:  Change in mental status EXAM: CT HEAD WITHOUT CONTRAST TECHNIQUE: Contiguous axial images were obtained from the base of the skull through the vertex without intravenous contrast. COMPARISON:  None. FINDINGS: Brain: No evidence of acute territorial infarction, hemorrhage, hydrocephalus,extra-axial collection or mass lesion/mass effect. There is dilatation the ventricles and sulci consistent with age-related atrophy. Low-attenuation changes in the deep white matter consistent with small vessel ischemia. Vascular: No hyperdense vessel or unexpected calcification. Skull: The skull is intact. No fracture or focal lesion identified. Sinuses/Orbits: The visualized paranasal sinuses and mastoid air cells are clear. The orbits and globes intact. Other: None Cervical spine: Alignment: There is straightening of the normal cervical lordosis. Skull base and vertebrae: Visualized skull base is intact. No atlanto-occipital dissociation. The vertebral body heights are well maintained. No fracture or pathologic osseous lesion seen. Soft tissues and spinal canal: The visualized paraspinal soft tissues are unremarkable. No prevertebral soft tissue swelling is seen. The spinal canal is grossly unremarkable, no large epidural collection or significant canal narrowing. Disc levels: Mild disc height loss with anterior osteophytes is most notable at C6-C7 mild bilateral neural foraminal narrowing. Upper chest: The lung apices are clear. Thoracic inlet is within normal limits. Other: None IMPRESSION: No acute intracranial abnormality. Findings consistent with age related atrophy and  chronic small vessel ischemia No acute fracture or malalignment of the spine. Electronically Signed   By: Jonna ClarkBindu  Avutu M.D.   On: 02/09/2020 15:39   CT Cervical Spine Wo Contrast  Result Date: 02/09/2020 CLINICAL DATA:  Change in mental status EXAM: CT HEAD WITHOUT CONTRAST TECHNIQUE: Contiguous axial images were obtained from the base of the skull through the vertex without intravenous contrast. COMPARISON:  None. FINDINGS: Brain: No evidence of acute territorial infarction, hemorrhage, hydrocephalus,extra-axial collection or mass lesion/mass effect. There is dilatation the ventricles and sulci consistent with age-related atrophy. Low-attenuation changes in the deep white matter consistent with small vessel ischemia. Vascular: No hyperdense vessel or unexpected calcification. Skull: The skull is intact. No fracture or focal lesion identified. Sinuses/Orbits: The visualized paranasal sinuses and mastoid air cells are clear. The orbits and globes intact. Other: None Cervical spine: Alignment: There is straightening of the normal cervical lordosis. Skull base and vertebrae: Visualized skull base is intact. No atlanto-occipital dissociation. The vertebral body heights are well maintained. No fracture or pathologic osseous lesion seen. Soft tissues and spinal canal: The visualized paraspinal soft tissues are unremarkable. No  prevertebral soft tissue swelling is seen. The spinal canal is grossly unremarkable, no large epidural collection or significant canal narrowing. Disc levels: Mild disc height loss with anterior osteophytes is most notable at C6-C7 mild bilateral neural foraminal narrowing. Upper chest: The lung apices are clear. Thoracic inlet is within normal limits. Other: None IMPRESSION: No acute intracranial abnormality. Findings consistent with age related atrophy and chronic small vessel ischemia No acute fracture or malalignment of the spine. Electronically Signed   By: Jonna Clark M.D.   On:  02/09/2020 15:39   MR BRAIN WO CONTRAST  Result Date: 02/09/2020 CLINICAL DATA:  59 year old male with decreased mental status. Delirium. EXAM: MRI HEAD WITHOUT CONTRAST TECHNIQUE: Multiplanar, multiecho pulse sequences of the brain and surrounding structures were obtained without intravenous contrast. COMPARISON:  Head and cervical spine CT earlier today. Brain MRI 02/06/2015. FINDINGS: Brain: No restricted diffusion to suggest acute infarction. No midline shift, mass effect, evidence of mass lesion, ventriculomegaly, extra-axial collection or acute intracranial hemorrhage. Cervicomedullary junction and pituitary are within normal limits. Largely stable and normal for age gray and white matter signal throughout the brain. Periatrial white matter T2 and FLAIR hyperintensity has mildly progressed and is now greater on the right. But there is no associated cortical encephalomalacia or chronic cerebral blood products. White matter signal elsewhere, the deep gray nuclei, brainstem and cerebellum are unremarkable. Vascular: Major intracranial vascular flow voids are stable since 2016. Skull and upper cervical spine: Negative visible cervical spine, bone marrow signal. Sinuses/Orbits: Disconjugate gaze. Otherwise negative orbits. Paranasal Visualized paranasal sinuses and mastoids are stable and well pneumatized. Other: Visible internal auditory structures appear normal. Small T2 hyperintense areas in both parotid glands (series 14, image 3) have not significantly changed since 2016 and appear inconsequential. Chronic vertex scalp soft tissue scarring. IMPRESSION: 1. No acute intracranial abnormality. 2. Mild progression of nonspecific periventricular white matter signal changes since 2016. Otherwise stable and negative for age noncontrast MRI appearance of the brain. Electronically Signed   By: Odessa Fleming M.D.   On: 02/09/2020 22:37   DG Chest Portable 1 View  Result Date: 02/09/2020 CLINICAL DATA:  Altered mental  status EXAM: PORTABLE CHEST 1 VIEW COMPARISON:  05/30/2016 FINDINGS: The heart size and mediastinal contours are within normal limits. Both lungs are clear. The visualized skeletal structures are unremarkable. IMPRESSION: No acute abnormality of the lungs in AP portable projection. Electronically Signed   By: Lauralyn Primes M.D.   On: 02/09/2020 15:08      Subjective: Patient seen and examined bedside, resting comfortably in bedside chair.  No complaints this morning.  Ready for discharge home.  Denies headache, no dizziness, no chest pain, palpitations, shortness of breath, abdominal pain.  No acute events overnight per nursing staff.  Discharge Exam: Vitals:   02/12/20 0117 02/12/20 0616  BP: (!) 137/93 (!) 142/99  Pulse: 86 93  Resp: 17 16  Temp: 97.9 F (36.6 C) 99.4 F (37.4 C)  SpO2:  100%   Vitals:   02/11/20 1825 02/11/20 2246 02/12/20 0117 02/12/20 0616  BP: 127/82  (!) 137/93 (!) 142/99  Pulse: 98 (!) 105 86 93  Resp: 16 15 17 16   Temp: (!) 97.4 F (36.3 C)  97.9 F (36.6 C) 99.4 F (37.4 C)  TempSrc: Oral  Oral Oral  SpO2: 100% 99%  100%  Weight:      Height:        General: Pt is alert, awake, not in acute distress Cardiovascular: RRR, S1/S2 +,  no rubs, no gallops Respiratory: CTA bilaterally, no wheezing, no rhonchi, oxygenating well on room air Abdominal: Soft, NT, ND, bowel sounds + Extremities: no edema, no cyanosis    The results of significant diagnostics from this hospitalization (including imaging, microbiology, ancillary and laboratory) are listed below for reference.     Microbiology: Recent Results (from the past 240 hour(s))  SARS Coronavirus 2 by RT PCR (hospital order, performed in [facility] hospital lab) Nasopharyngeal Nasopharyngeal Swab     Status: None   Collection Time: 02/09/20  8:25 PM   Specimen: Nasopharyngeal Swab  Result Value Ref Range Status   SARS Coronavirus 2 NEGATIVE NEGATIVE Final    Comment: (NOTE) SARS-CoV-2 target  nucleic acids are NOT DETECTED.  The SARS-CoV-2 RNA is generally detectable in upper and lower respiratory specimens during the acute phase of infection. The lowest concentration of SARS-CoV-2 viral copies this assay can detect is 250 copies / mL. A negative result does not preclude SARS-CoV-2 infection and should not be used as the sole basis for treatment or other patient management decisions.  A negative result may occur with improper specimen collection / handling, submission of specimen other than nasopharyngeal swab, presence of viral mutation(s) within the areas targeted by this assay, and inadequate number of viral copies (<250 copies / mL). A negative result must be combined with clinical observations, patient history, and epidemiological information.  Fact Sheet for Patients:   BoilerBrush.com.cy  Fact Sheet for Healthcare Providers: https://pope.com/  This test is not yet approved or  cleared by the [place] FDA and has been authorized for detection and/or diagnosis of SARS-CoV-2 by FDA under an Emergency Use Authorization (EUA).  This EUA will remain in effect (meaning this test can be used) for the duration of the COVID-19 declaration under Section 564(b)(1) of the Act, 21 U.S.C. section 360bbb-3(b)(1), unless the authorization is terminated or revoked sooner.  Performed at [facility], [address], [place], [name] [address]      Labs: BNP (last 3 results) No results for input(s): BNP in the last 8760 hours. Basic Metabolic Panel: Recent Labs  Lab 02/09/20 1620 02/10/20 0047 02/11/20 0238 02/11/20 0720 02/12/20 0205  NA 139 139 139 139 142  K 4.6 4.3 5.8* 4.2 4.6  CL 111 111 109 107 110  CO2 20* 17* 23 22 24   GLUCOSE 100* 156* 88 146* 77  BUN 28* 22* 22* 19 18  CREATININE 1.28* 1.06 1.21 1.19 1.13  CALCIUM 8.9 8.9 9.2 9.6 8.9  MG  --   --  1.6*  --  1.8   Liver Function Tests: Recent  Labs  Lab 02/09/20 1620  AST 16  ALT 11  ALKPHOS 59  BILITOT 0.5  PROT 5.9*  ALBUMIN 3.0*   No results for input(s): LIPASE, AMYLASE in the last 168 hours. Recent Labs  Lab 02/09/20 1439  AMMONIA 19   CBC: Recent Labs  Lab 02/09/20 1421 02/09/20 1459 02/09/20 1620 02/10/20 0047  WBC 6.6  --  10.4 9.2  NEUTROABS  --   --  6.9  --   HGB 8.8* 13.3 13.0 12.9*  HCT 29.4* 39.0 42.6 40.1  MCV 89.9  --  86.6 85.1  PLT 109*  --  223 202   Cardiac Enzymes: No results for input(s): CKTOTAL, CKMB, CKMBINDEX, TROPONINI in the last 168 hours. BNP: Invalid input(s): POCBNP CBG: Recent Labs  Lab 02/11/20 1107 02/11/20 1606 02/11/20 1639 02/11/20 2106 02/12/20 0613  GLUCAP 98 67*  106* 136* 72   D-Dimer No results for input(s): DDIMER in the last 72 hours. Hgb A1c Recent Labs    02/10/20 0047  HGBA1C 5.5   Lipid Profile No results for input(s): CHOL, HDL, LDLCALC, TRIG, CHOLHDL, LDLDIRECT in the last 72 hours. Thyroid function studies Recent Labs    02/10/20 0047  TSH 0.888   Anemia work up No results for input(s): VITAMINB12, FOLATE, FERRITIN, TIBC, IRON, RETICCTPCT in the last 72 hours. Urinalysis    Component Value Date/Time   COLORURINE STRAW (A) 02/09/2020 2025   APPEARANCEUR CLEAR 02/09/2020 2025   LABSPEC 1.006 02/09/2020 2025   PHURINE 5.0 02/09/2020 2025   GLUCOSEU >=500 (A) 02/09/2020 2025   HGBUR NEGATIVE 02/09/2020 2025   BILIRUBINUR NEGATIVE 02/09/2020 2025   KETONESUR NEGATIVE 02/09/2020 2025   PROTEINUR NEGATIVE 02/09/2020 2025   UROBILINOGEN 0.2 02/04/2015 0059   NITRITE NEGATIVE 02/09/2020 2025   LEUKOCYTESUR NEGATIVE 02/09/2020 2025   Sepsis Labs Invalid input(s): PROCALCITONIN,  WBC,  LACTICIDVEN Microbiology Recent Results (from the past 240 hour(s))  SARS Coronavirus 2 by RT PCR (hospital order, performed in [facility] hospital lab) Nasopharyngeal Nasopharyngeal Swab     Status: None   Collection Time: 02/09/20  8:25 PM    Specimen: Nasopharyngeal Swab  Result Value Ref Range Status   SARS Coronavirus 2 NEGATIVE NEGATIVE Final    Comment: (NOTE) SARS-CoV-2 target nucleic acids are NOT DETECTED.  The SARS-CoV-2 RNA is generally detectable in upper and lower respiratory specimens during the acute phase of infection. The lowest concentration of SARS-CoV-2 viral copies this assay can detect is 250 copies / mL. A negative result does not preclude SARS-CoV-2 infection and should not be used as the sole basis for treatment or other patient management decisions.  A negative result may occur with improper specimen collection / handling, submission of specimen other than nasopharyngeal swab, presence of viral mutation(s) within the areas targeted by this assay, and inadequate number of viral copies (<250 copies / mL). A negative result must be combined with clinical observations, patient history, and epidemiological information.  Fact Sheet for Patients:   BoilerBrush.com.cy  Fact Sheet for Healthcare Providers: https://pope.com/  This test is not yet approved or  cleared by the [place] FDA and has been authorized for detection and/or diagnosis of SARS-CoV-2 by FDA under an Emergency Use Authorization (EUA).  This EUA will remain in effect (meaning this test can be used) for the duration of the COVID-19 declaration under Section 564(b)(1) of the Act, 21 U.S.C. section 360bbb-3(b)(1), unless the authorization is terminated or revoked sooner.  Performed at [facility], [address], [place], [name] [address]      Time coordinating discharge: Over 30 minutes  SIGNED:   Alvira Philips [place], DO  [facility] 02/12/2020, 10:52 AM

## 2020-02-12 NOTE — TOC Initial Note (Signed)
Transition of Care [facility]) - Initial/Assessment Note    Patient Details  Name: Ronald Roman MRN: [id number] Date of Birth: 22-Jul-1960  Transition of Care [facility]) CM/SW Contact:    Doy Hutching, LCSW Phone Number: 02/12/2020, 11:20 AM  Clinical Narrative:                 CSW spoke with pt at bedside. Introduced self, role, reason for visit. Pt from home (he states he rents a room) but cannot tell this Clinical research associate much more. There has been multiple calls placed from [facility] team over the weekend which did not reveal much. Pt states his mother lives in [place] but cannot tell this Clinical research associate much other than that. He is interested in a rolling walker and 3n1 commode. Pt meds have been sent to [facility].   Called owner of home [employer] [phone]) with no answer. Also called pt mother Michel Bickers [phone]) and was unable to leave a message. Spoke with Zacarias Pontes at [phone], neither she nor [facility] work with pt anymore. CSW will remove them as contacts.  CSW then called Lesli Albee (supervisor at [phone]). We discussed current challenges- Angie able to confirm pt from [facility] (licensed and managed- DSS in [facility] is aware of home). The home is owned by [employer] and the current Production designer, theatre/television/film Ms. Senaida Ores is on today. The home can be reached at [phone]. This information has been communicated to MD as they request any updates on pt medications.   Expected Discharge Plan: Home/Self Care Barriers to Discharge: Continued Medical Work up   Patient Goals and CMS Choice Patient states their goals for this hospitalization and ongoing recovery are:: to go home   Choice offered to / list presented to : Patient  Expected Discharge Plan and Services Expected Discharge Plan: Home/Self Care In-house Referral: Clinical Social Work Discharge Planning Services: CM Consult Post Acute Care Choice: Durable Medical Equipment Living arrangements for the  past 2 months: Boarding House Expected Discharge Date: 02/12/20               DME Arranged: Dan Humphreys rolling, 3-N-1  Prior Living Arrangements/Services Living arrangements for the past 2 months: [employer] Lives with:: Self Patient language and need for interpreter reviewed:: Yes (no needs) Do you feel safe going back to the place where you live?: Yes      Need for Family Participation in Patient Care: Yes (Comment) (supportive assistance as needed) Care giver support system in place?: Yes (comment) (roommates)   Criminal Activity/Legal Involvement Pertinent to Current Situation/Hospitalization: No - Comment as needed  Activities of Daily Living Home Assistive Devices/Equipment: None ADL Screening (condition at time of admission) Patient's cognitive ability adequate to safely complete daily activities?: Yes Is the patient deaf or have difficulty hearing?: No Does the patient have difficulty seeing, even when wearing glasses/contacts?: No Does the patient have difficulty concentrating, remembering, or making decisions?: Yes Patient able to express need for assistance with ADLs?: No Does the patient have difficulty dressing or bathing?: No Independently performs ADLs?: Yes (appropriate for developmental age) Does the patient have difficulty walking or climbing stairs?: No Weakness of Legs: None Weakness of Arms/Hands: None  Permission Sought/Granted Permission sought to share information with : Family Supports Permission granted to share information with : Yes, Verbal Permission Granted  Share Information with NAME: Michel Bickers and Zacarias Pontes     Permission granted to share info w Relationship: mother and half sister  Permission granted  to share info w Contact Information: [phone] [facility]), pt mother does not have voicemail set up  Emotional Assessment Appearance:: Appears stated age Attitude/Demeanor/Rapport: Guarded Affect (typically observed):  Guarded Orientation: : Oriented to Self, Oriented to Place, Oriented to  Time, Oriented to Situation Alcohol / Substance Use: Not Applicable Psych Involvement: Outpatient Provider  Admission diagnosis:  Seizure disorder (HCC) [G40.909] Altered mental status, unspecified altered mental status type [R41.82] Acute metabolic encephalopathy [G93.41] Patient Active Problem List   Diagnosis Date Noted  . Acute psychosis (HCC)   . Leukocytosis 02/06/2015  . Sleep apnea 02/05/2015  . Seizure disorder (HCC) 02/04/2015  . Acute encephalopathy 02/04/2015  . Diabetes mellitus without complication (HCC)   . Hyperlipidemia   . Hypertension   . Paranoid schizophrenia (HCC)    PCP:  Salli Real, MD Pharmacy:   [facility] - [place], [name] - [address] [name] [address] Phone: [phone] Fax: [phone]   Readmission Risk Interventions No flowsheet data found.

## 2020-02-12 NOTE — Progress Notes (Signed)
Physical Therapy Treatment Patient Details Name: CAROLYN MANISCALCO MRN: [id number] DOB: [birth year] Today's Date: 02/12/2020    History of Present Illness Patient is a 59 y/o male who presents with episode of unresponsiveness, found slumped over when eating. Admitted with acute metabolic encephalopathy vs seizure with posital state. PMH includes paranoid schizophrenia, seizure disorder, HTN, dyslipidemia.    PT Comments    Patient progressing slowly towards PT goals. Improved balance noted with use of RW for support however continues to demonstrate poor safety awareness and poor RW management needing Min A at times. Pt getting tangled in IV lines/pole during mobility and needs cues for safety. BLE strength appears improved as pt with no knee buckling today. Will continue to follow.   Follow Up Recommendations  No PT follow up;Supervision for mobility/OOB     Equipment Recommendations  None recommended by PT (owns RW per report)    Recommendations for Other Services       Precautions / Restrictions Precautions Precautions: Fall Restrictions Weight Bearing Restrictions: No    Mobility  Bed Mobility               General bed mobility comments: Up in chair upon PT arrival.  Transfers Overall transfer level: Needs assistance Equipment used: None Transfers: Sit to/from Stand Sit to Stand: Min guard         General transfer comment: min guard for safety/balance, cueing for hand placement and safety. Stood from [employer], from toilet x1.  Ambulation/Gait Ambulation/Gait assistance: Min assist Gait Distance (Feet): 150 Feet Assistive device: Rolling walker (2 wheeled) Gait Pattern/deviations: Step-through pattern;Decreased stride length;Narrow base of support;Drifts right/left Gait velocity: varying speeds   General Gait Details: Unsteady gait requiring Min guard and moments of Min A for balance and RW management, but better with UE support. No knee buckling today.  Abrupt with turns. Walking in room wthout DME, getting tangled with IV pole and lines.   Stairs             Wheelchair Mobility    Modified Rankin (Stroke Patients Only)       Balance Overall balance assessment: Needs assistance Sitting-balance support: No upper extremity supported;Feet supported Sitting balance-Leahy Scale: Good     Standing balance support: During functional activity Standing balance-Leahy Scale: Fair Standing balance comment: Able to perform static standing without UE support but does better with UE support for walking.                            Cognition Arousal/Alertness: Awake/alert Behavior During Therapy: WFL for tasks assessed/performed Overall Cognitive Status: History of cognitive impairments - at baseline                                 General Comments: Poor safety awareness, decreased problem solving. Requires repetition at times to follow multi step commands. getting tangled in IV lines/pole      Exercises      General Comments        Pertinent Vitals/Pain Pain Assessment: No/denies pain    Home Living                      Prior Function            PT Goals (current goals can now be found in the care plan section) Progress towards PT goals: Progressing toward goals  Frequency    Min 3X/week      PT Plan Current plan remains appropriate    Co-evaluation              AM-PAC PT "6 Clicks" Mobility   Outcome Measure  Help needed turning from your back to your side while in a flat bed without using bedrails?: None Help needed moving from lying on your back to sitting on the side of a flat bed without using bedrails?: None Help needed moving to and from a bed to a chair (including a wheelchair)?: A Little Help needed standing up from a chair using your arms (e.g., wheelchair or bedside chair)?: A Little Help needed to walk in hospital room?: A Little Help needed climbing  3-5 steps with a railing? : A Little 6 Click Score: 20    End of Session Equipment Utilized During Treatment: Gait belt Activity Tolerance: Patient tolerated treatment well Patient left: in chair;with call bell/phone within reach Nurse Communication: Mobility status PT Visit Diagnosis: Difficulty in walking, not elsewhere classified (R26.2);Unsteadiness on feet (R26.81)     Time: 1134-1150 PT Time Calculation (min) (ACUTE ONLY): 16 min  Charges:  $Gait Training: 8-22 mins                     Vale Haven, PT, DPT Acute Rehabilitation Services Pager [phone] Office [phone]       [name] 02/12/2020, 12:42 PM

## 2020-05-07 ENCOUNTER — Encounter (HOSPITAL_COMMUNITY): Payer: Self-pay | Admitting: Emergency Medicine

## 2020-05-07 ENCOUNTER — Emergency Department (HOSPITAL_COMMUNITY): Payer: Medicaid Other

## 2020-05-07 ENCOUNTER — Observation Stay (HOSPITAL_COMMUNITY)
Admission: EM | Admit: 2020-05-07 | Discharge: 2020-05-08 | Disposition: A | Discharge status: Home / Self Care | Payer: Medicaid Other | Attending: Family Medicine | Admitting: Family Medicine

## 2020-05-07 DIAGNOSIS — F17210 Nicotine dependence, cigarettes, uncomplicated: Secondary | ICD-10-CM | POA: Diagnosis not present

## 2020-05-07 DIAGNOSIS — R569 Unspecified convulsions: Secondary | ICD-10-CM | POA: Diagnosis present

## 2020-05-07 DIAGNOSIS — E119 Type 2 diabetes mellitus without complications: Secondary | ICD-10-CM | POA: Insufficient documentation

## 2020-05-07 DIAGNOSIS — R4182 Altered mental status, unspecified: Secondary | ICD-10-CM | POA: Diagnosis not present

## 2020-05-07 DIAGNOSIS — I10 Essential (primary) hypertension: Secondary | ICD-10-CM | POA: Insufficient documentation

## 2020-05-07 DIAGNOSIS — N179 Acute kidney failure, unspecified: Secondary | ICD-10-CM | POA: Diagnosis not present

## 2020-05-07 DIAGNOSIS — Z7984 Long term (current) use of oral hypoglycemic drugs: Secondary | ICD-10-CM | POA: Insufficient documentation

## 2020-05-07 DIAGNOSIS — Z79899 Other long term (current) drug therapy: Secondary | ICD-10-CM | POA: Insufficient documentation

## 2020-05-07 DIAGNOSIS — Z794 Long term (current) use of insulin: Secondary | ICD-10-CM | POA: Diagnosis not present

## 2020-05-07 DIAGNOSIS — Z20822 Contact with and (suspected) exposure to covid-19: Secondary | ICD-10-CM | POA: Diagnosis not present

## 2020-05-07 LAB — URINALYSIS, ROUTINE W REFLEX MICROSCOPIC
Bacteria, UA: NONE SEEN
Bilirubin Urine: NEGATIVE
Glucose, UA: >=500 mg/dL — AB
Hgb urine dipstick: NEGATIVE
Ketones, ur: NEGATIVE mg/dL
Leukocytes,Ua: NEGATIVE
Nitrite: NEGATIVE
Protein, ur: NEGATIVE mg/dL
Specific Gravity, Urine: 1.013 (ref 1.005–1.030)
pH: 6 (ref 5.0–8.0)

## 2020-05-07 LAB — BRAIN NATRIURETIC PEPTIDE: B Natriuretic Peptide: 54.5 pg/mL (ref 0.0–100.0)

## 2020-05-07 LAB — CBC WITH DIFFERENTIAL/PLATELET
Abs Immature Granulocytes: 0.06 10*3/uL (ref 0.00–0.07)
Basophils Absolute: 0 10*3/uL (ref 0.0–0.1)
Basophils Relative: 1 %
Eosinophils Absolute: 0.1 10*3/uL (ref 0.0–0.5)
Eosinophils Relative: 1 %
HCT: 43.2 % (ref 39.0–52.0)
Hemoglobin: 13.5 g/dL (ref 13.0–17.0)
Immature Granulocytes: 1 %
Lymphocytes Relative: 27 %
Lymphs Abs: 2.3 10*3/uL (ref 0.7–4.0)
MCH: 26.6 pg (ref 26.0–34.0)
MCHC: 31.3 g/dL (ref 30.0–36.0)
MCV: 85 fL (ref 80.0–100.0)
Monocytes Absolute: 0.7 10*3/uL (ref 0.1–1.0)
Monocytes Relative: 8 %
Neutro Abs: 5.2 10*3/uL (ref 1.7–7.7)
Neutrophils Relative %: 62 %
Platelets: 266 10*3/uL (ref 150–400)
RBC: 5.08 MIL/uL (ref 4.22–5.81)
RDW: 15.8 % — ABNORMAL HIGH (ref 11.5–15.5)
WBC: 8.4 10*3/uL (ref 4.0–10.5)
nRBC: 0 % (ref 0.0–0.2)

## 2020-05-07 LAB — RAPID URINE DRUG SCREEN, HOSP PERFORMED
Amphetamines: NOT DETECTED
Barbiturates: NOT DETECTED
Benzodiazepines: NOT DETECTED
Cocaine: NOT DETECTED
Opiates: NOT DETECTED
Tetrahydrocannabinol: NOT DETECTED

## 2020-05-07 LAB — LACTIC ACID, PLASMA
Lactic Acid, Venous: 3.9 mmol/L (ref 0.5–1.9)
Lactic Acid, Venous: 1.6 mmol/L (ref 0.5–1.9)

## 2020-05-07 LAB — ETHANOL: Alcohol, Ethyl (B): <10 mg/dL (ref <10)

## 2020-05-07 LAB — COMPREHENSIVE METABOLIC PANEL
ALT: 13 U/L (ref 0–44)
AST: 23 U/L (ref 15–41)
Albumin: 3.3 g/dL — ABNORMAL LOW (ref 3.5–5.0)
Alkaline Phosphatase: 58 U/L (ref 38–126)
Anion gap: 13 (ref 5–15)
BUN: 26 mg/dL — ABNORMAL HIGH (ref 6–20)
CO2: 17 mmol/L — ABNORMAL LOW (ref 22–32)
Calcium: 9.2 mg/dL (ref 8.9–10.3)
Chloride: 107 mmol/L (ref 98–111)
Creatinine, Ser: 1.56 mg/dL — ABNORMAL HIGH (ref 0.61–1.24)
GFR, Estimated: 51 mL/min — ABNORMAL LOW (ref >60)
Glucose, Bld: 185 mg/dL — ABNORMAL HIGH (ref 70–99)
Potassium: 4.6 mmol/L (ref 3.5–5.1)
Sodium: 137 mmol/L (ref 135–145)
Total Bilirubin: 0.8 mg/dL (ref 0.3–1.2)
Total Protein: 6.2 g/dL — ABNORMAL LOW (ref 6.5–8.1)

## 2020-05-07 LAB — VALPROIC ACID LEVEL: Valproic Acid Lvl: 63 ug/mL (ref 50.0–100.0)

## 2020-05-07 LAB — TROPONIN I (HIGH SENSITIVITY)
Troponin I (High Sensitivity): 7 ng/L (ref <18)
Troponin I (High Sensitivity): 4 ng/L (ref <18)

## 2020-05-07 LAB — AMMONIA: Ammonia: 31 umol/L (ref 9–35)

## 2020-05-07 LAB — CBG MONITORING, ED
Glucose-Capillary: 173 mg/dL — ABNORMAL HIGH (ref 70–99)
Glucose-Capillary: 118 mg/dL — ABNORMAL HIGH (ref 70–99)

## 2020-05-07 LAB — LIPASE, BLOOD: Lipase: 41 U/L (ref 11–51)

## 2020-05-07 MED ORDER — QUETIAPINE FUMARATE 100 MG PO TABS
100.0000 mg | ORAL_TABLET | Freq: Two times a day (BID) | ORAL | Status: DC
  Administered 2020-05-08 (×2): 100 mg via ORAL
  Filled 2020-05-07: qty 1
  Filled 2020-05-07: qty 2
  Filled 2020-05-07 (×2): qty 1
  Filled 2020-05-07: qty 2
  Filled 2020-05-07: qty 1

## 2020-05-07 MED ORDER — SODIUM CHLORIDE 0.9 % IV SOLN: INTRAVENOUS | Status: DC

## 2020-05-07 MED ORDER — SODIUM CHLORIDE 0.9 % IV SOLN
75.0000 mL/h | INTRAVENOUS | Status: DC
  Administered 2020-05-07: 16:00:00 75 mL/h via INTRAVENOUS

## 2020-05-07 MED ORDER — DIVALPROEX SODIUM 250 MG PO DR TAB
1250.0000 mg | DELAYED_RELEASE_TABLET | Freq: Every day | ORAL | Status: DC
  Administered 2020-05-08: 1250 mg via ORAL
  Filled 2020-05-07 (×3): qty 5

## 2020-05-07 MED ORDER — CLOZAPINE 25 MG PO TABS
150.0000 mg | ORAL_TABLET | Freq: Every day | ORAL | Status: DC
  Administered 2020-05-08: 10:00:00 150 mg via ORAL
  Filled 2020-05-07: qty 2

## 2020-05-07 MED ORDER — HYDROXYZINE HCL 25 MG PO TABS
25.0000 mg | ORAL_TABLET | Freq: Four times a day (QID) | ORAL | Status: DC
  Administered 2020-05-08 (×2): 25 mg via ORAL
  Filled 2020-05-07 (×3): qty 1

## 2020-05-07 MED ORDER — NICOTINE 14 MG/24HR TD PT24: 14.0000 mg | MEDICATED_PATCH | Freq: Every day | TRANSDERMAL | Status: DC | PRN

## 2020-05-07 MED ORDER — ENOXAPARIN SODIUM 40 MG/0.4ML ~~LOC~~ SOLN
40.0000 mg | SUBCUTANEOUS | Status: DC
  Administered 2020-05-08: 40 mg via SUBCUTANEOUS
  Filled 2020-05-07 (×2): qty 0.4

## 2020-05-07 MED ORDER — AMANTADINE HCL 100 MG PO CAPS
100.0000 mg | ORAL_CAPSULE | Freq: Two times a day (BID) | ORAL | Status: DC
  Administered 2020-05-08 (×2): 100 mg via ORAL
  Filled 2020-05-07 (×3): qty 1

## 2020-05-07 MED ORDER — DIVALPROEX SODIUM 250 MG PO DR TAB
1250.0000 mg | DELAYED_RELEASE_TABLET | Freq: Every day | ORAL | Status: DC
Start: 2020-05-07 — End: 2020-05-07

## 2020-05-07 MED ORDER — ACETAMINOPHEN 650 MG RE SUPP: 650.0000 mg | RECTAL | Status: DC | PRN

## 2020-05-07 MED ORDER — SODIUM CHLORIDE 0.9 % IV BOLUS
500.0000 mL | Freq: Once | INTRAVENOUS | Status: AC
  Administered 2020-05-07: 15:00:00 500 mL via INTRAVENOUS

## 2020-05-07 MED ORDER — INSULIN GLARGINE 100 UNIT/ML ~~LOC~~ SOLN
10.0000 [IU] | Freq: Every day | SUBCUTANEOUS | Status: DC
  Administered 2020-05-08: 10:00:00 10 [IU] via SUBCUTANEOUS
  Filled 2020-05-07: qty 0.1

## 2020-05-07 MED ORDER — INSULIN ASPART 100 UNIT/ML ~~LOC~~ SOLN
0.0000 [IU] | Freq: Three times a day (TID) | SUBCUTANEOUS | Status: DC
  Administered 2020-05-08: 11:00:00 1 [IU] via SUBCUTANEOUS

## 2020-05-07 MED ORDER — ACETAMINOPHEN 325 MG PO TABS: 650.0000 mg | ORAL_TABLET | ORAL | Status: DC | PRN

## 2020-05-07 MED ORDER — POLYETHYLENE GLYCOL 3350 17 G PO PACK: 17.0000 g | PACK | Freq: Every day | ORAL | Status: DC | PRN

## 2020-05-07 MED ORDER — QUETIAPINE FUMARATE 50 MG PO TABS
50.0000 mg | ORAL_TABLET | Freq: Every day | ORAL | Status: DC
  Administered 2020-05-08: 12:00:00 50 mg via ORAL
  Filled 2020-05-07 (×3): qty 1

## 2020-05-07 NOTE — ED Notes (Signed)
Pt's CBG result was 173. Informed [place] - Charity fundraiser.

## 2020-05-07 NOTE — ED Triage Notes (Signed)
Pt here from a group home found  Semi responsive to painful stimuli after going out to eat breakfast , pt incontinent of bowel and bladder and vomited once , pt did receive narcan IN 1mg  by ems

## 2020-05-07 NOTE — Consult Note (Addendum)
Neurology Consultation  Reason for Consult: seizure like activity Referring Physician: Deretha Emory  CC: I didn't have a seizure. I have never had seizures.   History is obtained from:chart, ED MD, group home.   HPI: Ronald Roman is a 59 y.o. male with a PMHx of seizure disorder, HTN, paranoid schizophrenia, and DM II. Today, pt was at his group home and went outside to smoke a cigarette and staff found him slumped over in chair (EMS reported that he was on the ground).  Group home person reported that his limbs were stiff. Per EMS, he was incontinent of stool and urine. EMS gave Narcan which did not reverse his mental status. Therefore, he was thought to have had a breakthrough seizure.    NP called group home to ask about pt's Depakote dose because there are conflicting doses on the chart. The lady that actually dispenses pt's meds told NP that he only takes Depakote ER 500mg  ii po qhs. NP inquired as to if she was positive that it wasn't iii tabs or ii tabs with an extra 250mg . She replied no.   In chart review, patient had an EEG in Sept 2021 which was neg but had artifact. His discharge dose of Depakote was listed as 1500mg  per day. NP can not find what neuro MD he follows with outpt or who prescribes his meds.   So far, workup has revealed no results to suggest provoked seizure from infection, withdrawal, or metabolic derangement.   When seen, pt is easily aroused and converses. He will participate in most parts of exam, but will not let NP test for strength, stating his arms and legs are just weak. He remembers eating breakfast this am and going out on back porch to smoke. This is all he remembers. He states he has no family or emergency contact except group home.   ROS: A 14 point ROS was performed and is negative except as noted in the HPI. Unable to obtain complete ROS due to altered mental status.   Past Medical History:  Diagnosis Date  . Diabetes mellitus without complication  (HCC)   . GERD (gastroesophageal reflux disease)   . Hyperlipidemia   . Hypertension   . Paranoid schizophrenia (HCC)   . Sleep apnea   . Uric acid nephrolithiasis    No family history on file.  Social History:   reports that he has been smoking cigarettes. He has been smoking about 1.00 pack per day. He has never used smokeless tobacco. He reports that he does not drink alcohol and does not use drugs.   Medications Current Outpatient Medications  Medication Instructions  . albuterol (PROVENTIL HFA;VENTOLIN HFA) 108 (90 BASE) MCG/ACT inhaler 2 puffs, Inhalation, Every 4 hours PRN  . amantadine (SYMMETREL) 100 mg, Oral, 2 times daily  . cloZAPine (CLOZARIL) 150 mg, Oral, Daily after breakfast  . dapagliflozin propanediol (FARXIGA) 10 mg, Oral, Daily after breakfast  . divalproex (DEPAKOTE ER) 1,500 mg, Oral, Daily at bedtime  . divalproex (DEPAKOTE) 500 mg, Oral, See admin instructions, Takes 2 tablets (1000 mg totally) DR combine with 1 tablet (250 mg) DR to make (1250 mg totally) by mouth at bed time  . divalproex (DEPAKOTE)  Please see note above in HPI about Depakote dose 250 mg, Oral, See admin instructions, Takes 1 tablets (250 mg) DR combine with 2 tablets (1000 mg totally) to make (1250 mg totally) by mouth at bed time  . hydrOXYzine (ATARAX/VISTARIL) 25 mg, Oral, 4 times daily  .  insulin glargine (LANTUS) 20 Units, Subcutaneous, Daily at bedtime  . lisinopril (ZESTRIL) 20 mg, Oral, Daily  . metFORMIN (GLUCOPHAGE) 1,000 mg, Oral, 2 times daily with meals  . QUEtiapine (SEROQUEL) 50 mg, Oral, Daily  . QUEtiapine (SEROQUEL) 100 mg, Oral, 2 times daily  . traZODone (DESYREL) 50-100 mg, Oral, At bedtime PRN  . zolpidem (AMBIEN) 5 mg, Oral, Daily at bedtime     Exam: Current vital signs: BP 98/70   Pulse 81   Temp (!) 96.3 F (35.7 C) (Temporal)   Resp 15   SpO2 100%  Vital signs in last 24 hours: Temp:  [96.3 F (35.7 C)] 96.3 F (35.7 C) (12/15 1036) Pulse Rate:   [73-109] 81 (12/15 1330) Resp:  [14-24] 15 (12/15 1330) BP: (89-112)/(55-79) 98/70 (12/15 1330) SpO2:  [100 %] 100 % (12/15 1330)  GENERAL: Awake, alert in NAD HEENT: - Normocephalic and atraumatic, dry mm LUNGS - Normal respiratory effort. SaO2  100%.  CV - RRR on tele ABDOMEN - Soft, nontender Ext: warm, well perfused Psychiatric: Poorly/intermittently cooperative, irritable, at times tangential with odd thought content such as describing his pet snakes turning into different kinds of snakes Skin: No severe abrasions/lacerations on visible skin  NEURO:  Mental Status: AA&O x year, president, month, but not day or date. Knows his name and why he is here.  Speech/Language: speech is intact. He will not repeat "today is a sunny day" and states he doesn't know what the weather is like. Comprehension is delayed somewhat. Naming is intact. Speech is fluid.  Perseverates on eagerness to go home Cranial Nerves:  II: PERRL2 mm/brisk. visual fields full.  III, IV, VI: EOMI. Lid elevation symmetric and full.  V: sensation is intact and symmetrical to face. Moves jaw back and forth.  VII: Smile is symmetrical. Able to puff cheeks and raise eyebrows.  VIII: hearing intact to voice IX, X: palate elevation is symmetric. Phonation normal.  XI: normal sternocleidomastoid and trapezius muscle strength CNO:BSJGGE is symmetrical without fasciculations.   Motor: 5/5 strength to grips, but will not participate in strength exam elsewhere.  Tone is normal. Bulk is normal.  He has significant extraneous movements consistent with tardive dyskinesia including frequent blinking of his eyes, mouth movements, and movements of both of his arms and legs Sensation- Intact to light touch bilaterally in all four extremities. Extinction intact.   DTRs: 1+ throughout.  Gait- deferred  Labs I have reviewed labs in epic and the results pertinent to this consultation are: Tox screen pending. Ammonia 31. WBCC normal. UA  pending. ETOH <10. Depakote level 63.    CBC    Component Value Date/Time   WBC 8.4 05/07/2020 1028   RBC 5.08 05/07/2020 1028   HGB 13.5 05/07/2020 1028   HCT 43.2 05/07/2020 1028   PLT 266 05/07/2020 1028   MCV 85.0 05/07/2020 1028   MCH 26.6 05/07/2020 1028   MCHC 31.3 05/07/2020 1028   RDW 15.8 (H) 05/07/2020 1028   LYMPHSABS 2.3 05/07/2020 1028   MONOABS 0.7 05/07/2020 1028   EOSABS 0.1 05/07/2020 1028   BASOSABS 0.0 05/07/2020 1028    CMP     Component Value Date/Time   NA 137 05/07/2020 1028   K 4.6 05/07/2020 1028   CL 107 05/07/2020 1028   CO2 17 (L) 05/07/2020 1028   GLUCOSE 185 (H) 05/07/2020 1028   BUN 26 (H) 05/07/2020 1028   CREATININE 1.56 (H) 05/07/2020 1028   CALCIUM 9.2 05/07/2020 1028   PROT  6.2 (L) 05/07/2020 1028   ALBUMIN 3.3 (L) 05/07/2020 1028   AST 23 05/07/2020 1028   ALT 13 05/07/2020 1028   ALKPHOS 58 05/07/2020 1028   BILITOT 0.8 05/07/2020 1028   GFRNONAA 51 (L) 05/07/2020 1028   GFRAA >60 02/12/2020 0205    Lipid Panel     Component Value Date/Time   CHOL 145 03/23/2016 0619   TRIG 144 03/23/2016 0619   HDL 30 (L) 03/23/2016 0619   CHOLHDL 4.8 03/23/2016 0619   VLDL 29 03/23/2016 0619   LDLCALC 86 03/23/2016 0619     Imaging I have reviewed the images obtained:  CT-scan of the brain--No acute intracranial abnormality.  MRI examination of the brain 02/09/2020 1. No acute intracranial abnormality. 2. Mild progression of nonspecific periventricular white matter signal changes since 2016. Otherwise stable and negative for age noncontrast MRI appearance of the brain.  EEG 02/10/20. Clinical Interpretation: This EEG is slightly limited due to the patient's chewing throughout the study, but given this limitation no clear abnormalities were seen. There was no seizure or seizure predisposition recorded on this study. Please note that lack of epileptiform activity on EEG does not preclude the possibility of epilepsy.     Assessment: 59 yo male with a hx significant for seizure disorder and paranoid schizophrenia. Pt lives at a group home. Today, he went outside to smoke and group home staff found him slumped over in a chair with stiff limbs. However, EMS stated he was on the ground. Witness reports stiff extremities and unresponsiveness. Narcan given without change in mentation. Pt has remained post ictal since arrival to ED but had awakened at NPs exam. There is a discretion about how much Depakote he is actually on as group home worker stated one gram at bedtime and chart states 1250 mg in one place and 1500 mg in another.   At the time of his last breakthrough seizure, recommendation was made to increase Depakote dose and he continues to have room to increase this medication as he is on the lower end of the therapeutic range.  Therefore suggest increasing Depakote as previously planned and confirming with group home that they are aware of this medication change.  Rather than all 1500 mg at night, 1000 mg at night and 500 in the morning may provide better antiseizure coverage while still allowing good sleep and preventing excessive daytime sleepiness  Impression:Breakthrough seizure activity. Still awaiting some tests to make sure this seizure was not provoked.   Recommendations: -Hold on EEG as patient is refusing this examination stating he has had multiple EEGs in the past.  He has had one normal EEG in our EMR as well as one that was significantly motion limited -Increase home Depakote ER to 500mg  in am and 1000mg  (500 mg ii tabs) po at hs. -will need outpt f/up with neurologist in 2-4 weeks after discharge (order placed) -Appreciate primary team's note that the patient appears to lack capacity.  Agree with this assessment, though his confusion/irritability may improve further out from the seizure. -pt is being admitted by teaching service.  -Neurology will be available on an as-needed basis going forward,  please page if new neurological questions arise  Pt seen by , NP/Neuro and later by MD. Note/plan to be edited by MD as needed.  Pager:  Attending Neurologist's note:  I personally saw this patient, gathering history, performing a full neurologic examination, reviewing relevant labs, personally reviewing relevant imaging including head CT, and formulated the  assessment and plan, adding the note above for completeness and clarity to accurately reflect my thoughts

## 2020-05-07 NOTE — H&P (Addendum)
Family Medicine Teaching [facility] Admission History and Physical Service Pager: [phone]  Patient name: Ronald Roman Medical record number: [id number] Date of birth: 08-Sep-1960 Age: 59 y.o. Gender: male  Primary Care Provider: Salli Real, MD Consultants: Neurology Code Status:  FULL  Preferred Emergency Contact: [facility], [facility] [place]: [phone]  Chief Complaint: "I had a seizure"  Assessment and Plan: Ronald Roman is a 59 y.o. male presenting s/p unwitnessed seizure. PMH is significant for seizure disorder, T2DM, HTN, HLD, paranoid schizophrenia, OSA.   Unwitnessed Seizure  Hx Seizure Disorder Patient brought in by EMS after being found unresponsive and incontinent of urine outside. Received Narcan x1 by EMS. Patient was hypothermic to 96.3 on presentation to the ED. Labs drawn in ED generally unremarkable with ethanol <10, lactic acid increased at 3.9. CBG slightly elevated at 173. CBC, BNP, Troponin, valproic acid within normal limits. CT head and CXR unremarkable. TSH WNL in 9/21. Patient with history of seizure disorder, on Depakote. Was admitted on 02/12/20 for similar presentation. At that time was discharged on 1500 mg Depakote daily, but per Group Home, has been taking 1000mg  QHS. Patient is poor historian, often changing the story several times. Told that he has had four seizures in the last three days, then later stated he hasn't had a seizure in a long time. Per [place], a worker at the group home, patient was slumped over in the chair outside. EMS called and noted that patient had urinated on himself. Patient initially did not recall this event and later stated that he "had a bad reaction to the cigarette". Unsure of patients compliance with medications, though Depakote level was within normal limits. Neurology consulted in the ED, appreciate their consult and recommendations. Patient will likely require addition of AED. Though details of event are unclear, it  appears that patient did have a seizure due to his incontinence and similar presentation. Should also consider infectious etiology that could be causing AMS or potential substance use. LA has downtrended to 1.6, but will f/u blood and urine cultures.  Reassuringly, WBC is WNL.  Trops are negative and EKG is also unchanged, therefore doubt cardiac etiology.   - Admit to FPTS med-tele, Dr. Wille Celeste attending - Neurology consulted, appreciate recommendations - Continuous cardiac monitoring, pulse ox - Vital signs per unit protocol - Fall precautions, Ambulate with assistance - IV hydration with encouraged PO intake  - Daily CBC/BMP  - UA, Ucx pending - UDS pending - Blood culture pending - Regular diet - PT/OT eval and treat  Acute Kidney Injury  Patient with admission creatinine of 1.56. Baseline appears to be 1.09. Suspect likely pre-renal from dehydration as patient with dry mucous membranes on examination.  - Continue IV NS @ 75cc/hr, encourage hydration  - Holding Lisinopril  - AM BMP - Avoid nephrotoxic agents as possible  T2DM: chronic, stable Last HgA1C 5.5 on 02/10/20. On Metformin BID, farxiga 10 mg daily, Lantus 25 units.  - Holding home Metformin - Lantus 10U daily - sSSI - CBGs Ac/HS - A1c in AM - Hypoglycemia protocols ordered   HTN: chronic, stable Blood pressures soft in ED, 90's systolic. S/p 1L NS bolus and now improved to low 100's. On Lisinopril 20 mg daily. - Hold lisinopril in the setting of hypotension and AKI - Vitals per floor protocol   Paranoid Schizophrenia: chronic, stable On seroquel, clozapine, amantadine. Apparently lives in a group home. Patient poor historian, unsure if taking all medications as prescribed.  - Continue  home medications   Tobacco Abuse: Current 1 ppd smoker w/ unknown pack-year history, per chart records. - Nicotine patch 14mg  PRN - Encourage smoking cessation   FEN/GI: Regular diet  Prophylaxis: Lovenox  Disposition:  Med-tele  History of Present Illness:  Ronald Roman is a 59 y.o. male presenting s/p AMS and concern for unwitnessed seizure.  Patient reports that he was brought in to the hospital today because he had a seizure.  He states that he recently bought a house and lives alone.  He came via EMS from a group home.  He doesn't recall any events that led to him being transported to ED.  States that yesterday was his 4th seizure in three days and takes his medications. Later states that he has not had a seizure "in a long time". He follows with neurology.  Spoke with Janie at his group home who states that patient took his meds them AM, at breakfast, then went outside to smoke on the porch.  He was found awhile later slumped in the chair.  Per EMS, patient had urinated on himself.  46 reports that patient is given his medications by the group home.  He does not have a DNR on file there.  She is not aware of any involved family and no one has visited him to her knowledge.  She states that at baseline, he helps at the group home, does cleaning, does the dishes.  She states that frequently he will say things that are a lie.  ED Course: Patient brought in by EMS for AMS. Received Narcan x1 by EMS. S/p 1L NS bolus. Neurology consulted.   Review Of Systems: Per HPI with the following additions:   Review of Systems  Unable to perform ROS: Other  Neurological: Positive for seizures and headaches.   Patient not cooperative with ROS  Patient Active Problem List   Diagnosis Date Noted  . Acute psychosis (HCC)   . Leukocytosis 02/06/2015  . Sleep apnea 02/05/2015  . Seizure disorder (HCC) 02/04/2015  . Acute encephalopathy 02/04/2015  . Diabetes mellitus without complication (HCC)   . Hyperlipidemia   . Hypertension   . Paranoid schizophrenia [facility])     Past Medical History: Past Medical History:  Diagnosis Date  . Diabetes mellitus without complication (HCC)   . GERD (gastroesophageal reflux  disease)   . Hyperlipidemia   . Hypertension   . Paranoid schizophrenia (HCC)   . Sleep apnea   . Uric acid nephrolithiasis     Past Surgical History: No past surgical history on file.  Social History: Social History   Tobacco Use  . Smoking status: Current Every Day Smoker    Packs/day: 1.00    Types: Cigarettes  . Smokeless tobacco: Never Used  Substance Use Topics  . Alcohol use: No  . Drug use: No   Additional social history Please also refer to relevant sections of EMR.  Family History: No family history on file. Unknown family history, patient reports being in a foster home.  Allergies and Medications: Allergies  Allergen Reactions  . Cogentin [Benztropine] Other (See Comments)    Per MAR   . Penicillins Other (See Comments)    Per MAR - unable to verify patients PCN reaction   . Prolixin [Fluphenazine] Other (See Comments)    Per MAR    No current facility-administered medications on file prior to encounter.   Current Outpatient Medications on File Prior to Encounter  Medication Sig Dispense Refill  . amantadine (  SYMMETREL) 100 MG capsule Take 1 capsule (100 mg total) by mouth 2 (two) times daily. 60 capsule 0  . cloZAPine (CLOZARIL) 100 MG tablet Take 150 mg by mouth daily after breakfast.     . dapagliflozin propanediol (FARXIGA) 10 MG TABS tablet Take 10 mg by mouth daily after breakfast.    . divalproex (DEPAKOTE) 250 MG DR tablet Take 250 mg by mouth See admin instructions. Takes 1 tablets (250 mg) DR combine with 2 tablets (1000 mg totally) to make (1250 mg totally) by mouth at bed time    . divalproex (DEPAKOTE) 500 MG DR tablet Take 500 mg by mouth See admin instructions. Takes 2 tablets (1000 mg totally) DR combine with 1 tablet (250 mg) DR to make (1250 mg totally) by mouth at bed time    . hydrOXYzine (ATARAX/VISTARIL) 25 MG tablet Take 25 mg by mouth 4 (four) times daily.    . insulin glargine (LANTUS) 100 UNIT/ML injection Inject 0.2 mLs (20 Units  total) into the skin at bedtime. (Patient taking differently: Inject 25 Units into the skin at bedtime.) 10 mL 0  . lisinopril (PRINIVIL,ZESTRIL) 20 MG tablet Take 1 tablet (20 mg total) by mouth daily. (Patient taking differently: Take 20 mg by mouth daily after breakfast.) 30 tablet 0  . metFORMIN (GLUCOPHAGE) 1000 MG tablet Take 1,000 mg by mouth 2 (two) times daily with a meal.    . QUEtiapine (SEROQUEL) 100 MG tablet Take 100 mg by mouth in the morning and at bedtime.    Marland Kitchen QUEtiapine (SEROQUEL) 50 MG tablet Take 50 mg by mouth daily at 12 noon.    . traZODone (DESYREL) 50 MG tablet Take 50-100 mg by mouth at bedtime as needed for sleep.    Marland Kitchen zolpidem (AMBIEN) 5 MG tablet Take 5 mg by mouth at bedtime.    Marland Kitchen albuterol (PROVENTIL HFA;VENTOLIN HFA) 108 (90 BASE) MCG/ACT inhaler Inhale 2 puffs into the lungs every 4 (four) hours as needed for wheezing or shortness of breath. (Patient not taking: Reported on 05/07/2020) 1 Inhaler 0  . divalproex (DEPAKOTE ER) 500 MG 24 hr tablet Take 3 tablets (1,500 mg total) by mouth at bedtime. (Patient not taking: No sig reported) 270 tablet 0    Objective: BP 98/70   Pulse 81   Temp (!) 96.3 F (35.7 C) (Temporal)   Resp 15   SpO2 100%  Exam: General: Alert and oriented to person, place, time and situation. Intermittently responsive, intermittently opens eyes Eyes: conjunctiva injected b/l  ENTM: dry mucous membranes Neck: No cervical LAD Cardiovascular: RRR, no murmur Respiratory: CTAB, no wheezing/rhonchi/rales Gastrointestinal: soft, non-tender in all quadrants, normoactive bowel sounds, no R/G MSK: moving all extremities Derm: warm and dry Neuro: AxO x4. No focal deficits, unable to follow commands well so unable to test strength accurately, speech is slightly mumbled but not slurred.  Psych: Contradictory speech, pleasant but doesn't follow commands  Labs and Imaging: CBC BMET  Recent Labs  Lab 05/07/20 1028  WBC 8.4  HGB 13.5  HCT 43.2   PLT 266   Recent Labs  Lab 05/07/20 1028  NA 137  K 4.6  CL 107  CO2 17*  BUN 26*  CREATININE 1.56*  GLUCOSE 185*  CALCIUM 9.2     EKG: Sinus tachycardia rate 101, no ST changes   Chest X-ray 12/15 IMPRESSION: Lungs clear.  Cardiac silhouette normal.  CT Head without contrast 12/15 IMPRESSION: No acute intracranial abnormality.   Sabino Dick, DO 05/07/2020, 1:46  PM PGY-1, [place] Intern pager: [phone], text pages welcome

## 2020-05-07 NOTE — Progress Notes (Signed)
I saw and examined this patient.  I discussed with the full resident team and we have agreed on a treatment plan.  Briefly, Patient with known seizure disorder had unwitnessed seizure/syncope.  Lives in a group home, was reportedly in normal health this morning, went out to smoke, and when he did not return they looked and found him down with urinary incontinence.  Hx is unreliable.  Chronic problem: Paranoid Schizophrenia.  "I don't have a seizure problem."  "I don't take seizure meds" and has a therapeutic drug level.  Issues: 1. Presumed seizure in the face of a therapeutic valproic Acid level.  Neuro is seeing.  Likely add second agent. 2. Lacks capacity.  His responses make it clear in my mind that he lacks capacity.  Importantly, he does not seem to be an immediate threat to self or others: I.e. he does not meet IVC criteria.  This is important because he is threatening to leave.  I would discourage this, but we cannot force him to stay since he does not meet IVC criteria. 3. Paranoid Schizophrenia: Logistically difficult.  He is refusing COVID testing.  Per hospital policy, we cannot force this.  Since he seems to be returning to his previous baseline quickly, I anticipate this being a 24 hour admit for obs.  Hopefully we can get an EEG and neuro can decide on second antiseizure med.  We will notify bed control about his COVID test refusal.  If he chooses to sign out AMA, we will not stop him.

## 2020-05-07 NOTE — ED Provider Notes (Signed)
[facility] EMERGENCY DEPARTMENT Provider Note   CSN: [id number] Arrival date & time: 05/07/20  1010     History No chief complaint on file.   Ronald Roman is a 59 y.o. male.  Patient brought in by EMS from group home.  Patient found essentially unresponsive except to painful stimuli after going out after eating breakfast.  Patient had breakfast and went outside was found on the ground with altered mental status.  He was incontinent of bowel and bladder and vomited once.  EMS did give him 1 mg of Narcan.  Not sure if that made any difference.  Patient arrived here still altered.  Was to sternal rub he would open his eyes.  Initially heart rate was 109 temp 96.  Initial blood pressure was in the low 90s.  Oxygen saturation on room air was 100%.  Chart review shows that patient has a history paranoid schizophrenia seizure disorder type 2 diabetes hypertension.  EMS stated blood sugars were good.  Patient is stated above from a group home he is a full code.  Is followed by [facility] medical clinic by Dr. Wynelle Link.        Past Medical History:  Diagnosis Date  . Diabetes mellitus without complication (HCC)   . GERD (gastroesophageal reflux disease)   . Hyperlipidemia   . Hypertension   . Paranoid schizophrenia (HCC)   . Sleep apnea   . Uric acid nephrolithiasis     Patient Active Problem List   Diagnosis Date Noted  . Acute psychosis (HCC)   . Leukocytosis 02/06/2015  . Sleep apnea 02/05/2015  . Seizure disorder (HCC) 02/04/2015  . Acute encephalopathy 02/04/2015  . Diabetes mellitus without complication (HCC)   . Hyperlipidemia   . Hypertension   . Paranoid schizophrenia (HCC)     No past surgical history on file.     No family history on file.  Social History   Tobacco Use  . Smoking status: Current Every Day Smoker    Packs/day: 1.00    Types: Cigarettes  . Smokeless tobacco: Never Used  Substance Use Topics  . Alcohol use: No  . Drug use: No     Home Medications Prior to Admission medications   Medication Sig Start Date End Date Taking? Authorizing Provider  amantadine (SYMMETREL) 100 MG capsule Take 1 capsule (100 mg total) by mouth 2 (two) times daily. 04/26/16  Yes Oneta Rack, NP  cloZAPine (CLOZARIL) 100 MG tablet Take 150 mg by mouth daily after breakfast.    Yes [provider]  dapagliflozin propanediol (FARXIGA) 10 MG TABS tablet Take 10 mg by mouth daily after breakfast.   Yes [provider]  divalproex (DEPAKOTE) 250 MG DR tablet Take 250 mg by mouth See admin instructions. Takes 1 tablets (250 mg) DR combine with 2 tablets (1000 mg totally) to make (1250 mg totally) by mouth at bed time   Yes [provider]  divalproex (DEPAKOTE) 500 MG DR tablet Take 500 mg by mouth See admin instructions. Takes 2 tablets (1000 mg totally) DR combine with 1 tablet (250 mg) DR to make (1250 mg totally) by mouth at bed time   Yes [provider]  hydrOXYzine (ATARAX/VISTARIL) 25 MG tablet Take 25 mg by mouth 4 (four) times daily. 10/17/19  Yes [provider]  insulin glargine (LANTUS) 100 UNIT/ML injection Inject 0.2 mLs (20 Units total) into the skin at bedtime. Patient taking differently: Inject 25 Units into the skin at bedtime.  04/26/16  Yes Oneta RackLewis, Tanika N, NP  lisinopril (PRINIVIL,ZESTRIL) 20 MG tablet Take 1 tablet (20 mg total) by mouth daily. Patient taking differently: Take 20 mg by mouth daily after breakfast. 04/27/16  Yes Oneta RackLewis, Tanika N, NP  metFORMIN (GLUCOPHAGE) 1000 MG tablet Take 1,000 mg by mouth 2 (two) times daily with a meal.   Yes [provider]  QUEtiapine (SEROQUEL) 100 MG tablet Take 100 mg by mouth in the morning and at bedtime.   Yes [provider]  QUEtiapine (SEROQUEL) 50 MG tablet Take 50 mg by mouth daily at 12 noon.   Yes [provider]  traZODone (DESYREL) 50 MG tablet Take 50-100 mg by mouth at bedtime as needed for sleep.   Yes  [provider]  zolpidem (AMBIEN) 5 MG tablet Take 5 mg by mouth at bedtime.   Yes [provider]  albuterol (PROVENTIL HFA;VENTOLIN HFA) 108 (90 BASE) MCG/ACT inhaler Inhale 2 puffs into the lungs every 4 (four) hours as needed for wheezing or shortness of breath. Patient not taking: Reported on 05/07/2020 04/21/15   Hayden RasmussenMabe, David, NP  divalproex (DEPAKOTE ER) 500 MG 24 hr tablet Take 3 tablets (1,500 mg total) by mouth at bedtime. Patient not taking: No sig reported 02/12/20 05/12/20  [place], Eric J, DO    Allergies    Cogentin [benztropine], Penicillins, and Prolixin [fluphenazine]  Review of Systems   Review of Systems  Unable to perform ROS: Mental status change    Physical Exam Updated Vital Signs BP 98/70   Pulse 81   Temp (!) 96.3 F (35.7 C) (Temporal)   Resp 15   SpO2 100%   Physical Exam Vitals and nursing note reviewed.  Constitutional:      Appearance: He is well-developed and well-nourished.  HENT:     Head: Normocephalic and atraumatic.     Mouth/Throat:     Mouth: Mucous membranes are dry.  Eyes:     Conjunctiva/sclera: Conjunctivae normal.     Comments: Pupils pinpoint.  Cardiovascular:     Rate and Rhythm: Normal rate and regular rhythm.     Heart sounds: No murmur heard.   Pulmonary:     Effort: Pulmonary effort is normal. No respiratory distress.     Breath sounds: Normal breath sounds.  Abdominal:     Palpations: Abdomen is soft.     Tenderness: There is no abdominal tenderness.  Musculoskeletal:        General: No swelling or edema. Normal range of motion.     Cervical back: Normal range of motion and neck supple.  Skin:    General: Skin is warm and dry.     Capillary Refill: Capillary refill takes less than 2 seconds.  Neurological:     Comments: Patient arousable to painful stimuli.  Otherwise very somnolent.  We will somewhat move all 4 extremities spontaneously.  Psychiatric:        Mood and Affect: Mood and affect  normal.     ED Results / Procedures / Treatments   Labs (all labs ordered are listed, but only abnormal results are displayed) Labs Reviewed  COMPREHENSIVE METABOLIC PANEL - Abnormal; Notable for the following components:      Result Value   CO2 17 (*)    Glucose, Bld 185 (*)    BUN 26 (*)    Creatinine, Ser 1.56 (*)    Total Protein 6.2 (*)    Albumin 3.3 (*)    GFR, Estimated 51 (*)  All other components within normal limits  CBC WITH DIFFERENTIAL/PLATELET - Abnormal; Notable for the following components:   RDW 15.8 (*)    All other components within normal limits  LACTIC ACID, PLASMA - Abnormal; Notable for the following components:   Lactic Acid, Venous 3.9 (*)    All other components within normal limits  CBG MONITORING, ED - Abnormal; Notable for the following components:   Glucose-Capillary 173 (*)    All other components within normal limits  RESP PANEL BY RT-PCR (FLU A&B, COVID) ARPGX2  CULTURE, BLOOD (ROUTINE X 2)  CULTURE, BLOOD (ROUTINE X 2)  URINE CULTURE  LIPASE, BLOOD  ETHANOL  BRAIN NATRIURETIC PEPTIDE  VALPROIC ACID LEVEL  URINALYSIS, ROUTINE W REFLEX MICROSCOPIC  RAPID URINE DRUG SCREEN, HOSP PERFORMED  AMMONIA  LACTIC ACID, PLASMA  TROPONIN I (HIGH SENSITIVITY)  TROPONIN I (HIGH SENSITIVITY)    EKG EKG Interpretation  Date/Time:  Wednesday May 07 2020 10:44:55 EST Ventricular Rate:  101 PR Interval:    QRS Duration: 79 QT Interval:  325 QTC Calculation: 422 R Axis:   81 Text Interpretation: Sinus tachycardia Confirmed by Vanetta Mulders [phone]) on 05/07/2020 10:53:08 AM   Radiology CT Head Wo Contrast  Result Date: 05/07/2020 CLINICAL DATA:  Mental status change unknown cause. Possible seizure. EXAM: CT HEAD WITHOUT CONTRAST TECHNIQUE: Contiguous axial images were obtained from the base of the skull through the vertex without intravenous contrast. COMPARISON:  CT head 02/09/2020. FINDINGS: Brain: Cerebral ventricle sizes are  concordant with the degree of cerebral volume loss. Patchy and confluent areas of decreased attenuation are noted throughout the deep and periventricular white matter of the cerebral hemispheres bilaterally, compatible with chronic microvascular ischemic disease. no evidence of large-territorial acute infarction. No parenchymal hemorrhage. No mass lesion. No extra-axial collection. No mass effect or midline shift. No hydrocephalus. Basilar cisterns are patent. Vascular: No hyperdense vessel. Skull: No acute fracture or focal lesion. Sinuses/Orbits: Paranasal sinuses and mastoid air cells are clear. The orbits are unremarkable. Other: None. IMPRESSION: No acute intracranial abnormality. Electronically Signed   By: Tish Frederickson M.D.   On: 05/07/2020 11:17   DG Chest Port 1 View  Result Date: 05/07/2020 CLINICAL DATA:  Altered mental status EXAM: PORTABLE CHEST 1 VIEW COMPARISON:  February 09, 2020 FINDINGS: Lungs are clear. Heart size and pulmonary vascularity are normal. No adenopathy. No pneumothorax. No bone lesions. IMPRESSION: Lungs clear.  Cardiac silhouette normal. Electronically Signed   By: Bretta Bang III M.D.   On: 05/07/2020 10:49    Procedures Procedures (including critical care time)  CRITICAL CARE Performed by: Vanetta Mulders Total critical care time: 35 minutes Critical care time was exclusive of separately billable procedures and treating other patients. Critical care was necessary to treat or prevent imminent or life-threatening deterioration. Critical care was time spent personally by me on the following activities: development of treatment plan with patient and/or surrogate as well as nursing, discussions with consultants, evaluation of patient's response to treatment, examination of patient, obtaining history from patient or surrogate, ordering and performing treatments and interventions, ordering and review of laboratory studies, ordering and review of radiographic  studies, pulse oximetry and re-evaluation of patient's condition.   Medications Ordered in ED Medications  0.9 %  sodium chloride infusion ( Intravenous New Bag/Given 05/07/20 1039)  sodium chloride 0.9 % bolus 500 mL (has no administration in time range)  0.9 %  sodium chloride infusion (has no administration in time range)    ED Course  I  have reviewed the triage vital signs and the nursing notes.  Pertinent labs & imaging results that were available during my care of the patient were reviewed by me and considered in my medical decision making (see chart for details).    MDM Rules/Calculators/A&P                          Patient started on 75 cc normal saline fluid.  End up getting a liter.  Patient's blood pressure is never below 89 systolic.  Currently around 96.  His high we have gotten is 108 systolic.  Tachycardia is improved.  Oxygen sats on room air remained constant at 100%.  Chest x-ray negative for any acute findings or pneumonia.  Head CT without any acute findings.  Labs showed a slight elevation in his glucose at 173.  CO2 was down at 17.  White blood cell count not elevated.  Altered mental status work-up in process.  But feeling that this probably was a seizure and is post ictal.  Patient was admitted in September for similar findings.  Patient supposed to be on Depakote.  Depakote level ordered Covid ordered ammonia ordered urine drug screen ordered alcohol is not elevated.  Electrolytes without significant abnormalities.  Discussed with on-call neurology Dr. Iver Nestle.  Who will see patient in consultation.  Discussed with family medicine for admission.  Will Goeden give the patient some more fluids.  Since his BUN and creatinine is elevated some.  May be some degree of dehydration.  Patient has had no recurrent or further seizures while here.   Now much more alert.  Will open eyes and talk to calling his name.  But still somewhat somnolent.   Based on work-up so far feel  that this is post seizure and not a infectious process.     Final Clinical Impression(s) / ED Diagnoses Final diagnoses:  Altered mental status, unspecified altered mental status type  Seizure [facility])    Rx / DC Orders ED Discharge Orders    None       Vanetta Mulders, MD 05/07/20 1411

## 2020-05-07 NOTE — ED Notes (Signed)
Seizure pads placed on pt's bed.

## 2020-05-07 NOTE — ED Notes (Signed)
Dinner ordered 

## 2020-05-07 NOTE — Hospital Course (Addendum)
Ronald Roman is a 59 y.o. male that presented after an unwitnessed seizure. PMH significant for seizure disorder, T2DM, HTN, HLD, paranoid schizophrenia, OSA   Unwitnessed seizure  Hx of seizure disorder Patient brought to the ED after being found outside unresponsive and incontinent of urine. Patient received 1 dose of Narcan from EMS. On admission, patient found to be hypothermic with lactic acid elevated and other labs, including valproic acid level, within normal limits. CT head and CXR unremarkable. Blood and urine cultures were collected, antibiotics were not initiated due to low suspicion for infection. Neurology was consulted and recommended increasing Depakote dosing and outpatient follow-up after discharge. Patient refused EEG. Patient was discharged with Depakote 500mg  in the AM and 1000mg  QHS per neurology recommendations.   Acute kidney injury Creatinine was elevated on admission at 1.56. Baseline appears to be 1.1. Patient was started on fluids and home Lisinopril was held during admission. Creatinine improved to 1.33 at discharge.  HTN Home medication of lisinopril was held during admission due to initially lower blood pressures. Blood pressures improved with fluid administration. Patient was discharged on Lisinopril 10 mg (home dose was 20 mg).  Items for follow-up: Recommend checking BP trends outpatient as pressures were low on admission. Recommend BMP in 1-2 weeks to ensure creatinine improvement. Neurology outpatient follow-up 2-4 weeks after discharge for monitoring as Depakote increased to 500mg  in the AM and 1000mg  QHS. Reduced dose Lisinopril 10mg  on discharge due to AKI  Should have CBC q4 weeks while on Clozapine

## 2020-05-07 NOTE — ED Notes (Signed)
Attempted to swab pt for covid twice pt is refusing grabbing hands and turning his head

## 2020-05-07 NOTE — ED Notes (Signed)
[place] - RN aware of pt's vitals.

## 2020-05-08 ENCOUNTER — Other Ambulatory Visit (HOSPITAL_COMMUNITY): Payer: Self-pay | Admitting: Family Medicine

## 2020-05-08 DIAGNOSIS — R569 Unspecified convulsions: Secondary | ICD-10-CM | POA: Diagnosis not present

## 2020-05-08 DIAGNOSIS — R4182 Altered mental status, unspecified: Secondary | ICD-10-CM | POA: Diagnosis not present

## 2020-05-08 DIAGNOSIS — N179 Acute kidney failure, unspecified: Secondary | ICD-10-CM | POA: Diagnosis not present

## 2020-05-08 LAB — CBG MONITORING, ED
Glucose-Capillary: 132 mg/dL — ABNORMAL HIGH (ref 70–99)
Glucose-Capillary: 75 mg/dL (ref 70–99)
Glucose-Capillary: 88 mg/dL (ref 70–99)

## 2020-05-08 LAB — BASIC METABOLIC PANEL
Anion gap: 8 (ref 5–15)
BUN: 20 mg/dL (ref 6–20)
CO2: 26 mmol/L (ref 22–32)
Calcium: 9.5 mg/dL (ref 8.9–10.3)
Chloride: 106 mmol/L (ref 98–111)
Creatinine, Ser: 1.33 mg/dL — ABNORMAL HIGH (ref 0.61–1.24)
GFR, Estimated: >60 mL/min (ref >60)
Glucose, Bld: 94 mg/dL (ref 70–99)
Potassium: 4.8 mmol/L (ref 3.5–5.1)
Sodium: 140 mmol/L (ref 135–145)

## 2020-05-08 LAB — CBC
HCT: 45.6 % (ref 39.0–52.0)
Hemoglobin: 14.5 g/dL (ref 13.0–17.0)
MCH: 27 pg (ref 26.0–34.0)
MCHC: 31.8 g/dL (ref 30.0–36.0)
MCV: 84.8 fL (ref 80.0–100.0)
Platelets: 283 10*3/uL (ref 150–400)
RBC: 5.38 MIL/uL (ref 4.22–5.81)
RDW: 15.7 % — ABNORMAL HIGH (ref 11.5–15.5)
WBC: 11.4 10*3/uL — ABNORMAL HIGH (ref 4.0–10.5)
nRBC: 0 % (ref 0.0–0.2)

## 2020-05-08 LAB — RESP PANEL BY RT-PCR (FLU A&B, COVID) ARPGX2
Influenza A by PCR: NEGATIVE
Influenza B by PCR: NEGATIVE
SARS Coronavirus 2 by RT PCR: NEGATIVE

## 2020-05-08 LAB — URINE CULTURE: Culture: <10000 — AB

## 2020-05-08 LAB — HEMOGLOBIN A1C
Hgb A1c MFr Bld: 5.5 % (ref 4.8–5.6)
Mean Plasma Glucose: 111.15 mg/dL

## 2020-05-08 MED ORDER — DIVALPROEX SODIUM ER 500 MG PO TB24
1000.0000 mg | ORAL_TABLET | Freq: Every day | ORAL | Status: DC
  Filled 2020-05-08 (×2): qty 2

## 2020-05-08 MED ORDER — LISINOPRIL 20 MG PO TABS: 10.0000 mg | ORAL_TABLET | Freq: Every day | ORAL | 1 refills | Status: DC

## 2020-05-08 MED ORDER — DIVALPROEX SODIUM ER 500 MG PO TB24
500.0000 mg | ORAL_TABLET | Freq: Every morning | ORAL | Status: DC
  Administered 2020-05-08: 09:00:00 500 mg via ORAL
  Filled 2020-05-08 (×2): qty 1

## 2020-05-08 MED ORDER — DIVALPROEX SODIUM ER 500 MG PO TB24: 500.0000 mg | ORAL_TABLET | Freq: Every morning | ORAL | 1 refills | Status: DC

## 2020-05-08 MED ORDER — DIVALPROEX SODIUM ER 500 MG PO TB24: 1000.0000 mg | ORAL_TABLET | Freq: Every day | ORAL | 1 refills | Status: DC

## 2020-05-08 MED FILL — DIVALPROEX SOD ER 500 MG TA: 500 | 30 days supply | Qty: 90 | Fill #0

## 2020-05-08 MED FILL — LISINOPRIL 20 MG TABLET: 20 | 60 days supply | Qty: 30 | Fill #0

## 2020-05-08 NOTE — Progress Notes (Signed)
05/08/20 1148  PT Evaluation Information  Last PT Received On 05/08/20  Assistance Needed +1  PT/OT/SLP Co-Evaluation/Treatment Yes  Reason for Co-Treatment Necessary to address cognition/behavior during functional activity;For patient/therapist safety  PT goals addressed during session Mobility/safety with mobility;Balance  History of Present Illness 59 y.o. male that presented after an unwitnessed seizure. PMH significant for seizure disorder, T2DM, HTN, HLD, paranoid schizophrenia, OSA  Precautions  Precautions Fall  Restrictions  Weight Bearing Restrictions No  Home Living  Family/patient expects to be discharged to: Private residence  Living Arrangements Group Home  Available Help at Discharge Available 24 hours/day  Type of Home Group Home  Home Access  (unsure; pt states several steps; chart from previous admission says level entry)  Home Layout One level  Scientist, physiological Yes  Home Equipment Shower seat  Additional Comments questionable historian  Prior Function  Level of Independence Independent with assistive device(s)  Comments states he does not use a RW and has had multiple falls  Communication  Communication No difficulties  Pain Assessment  Pain Assessment Faces  Faces Pain Scale 0  Cognition  Arousal/Alertness Awake/alert  Behavior During Therapy Restless;Impulsive  Overall Cognitive Status No family/caregiver present to determine baseline cognitive functioning  General Comments Most likely close to baseline. Impulsive throughout session and required safety cues.  Upper Extremity Assessment  Upper Extremity Assessment Defer to OT evaluation  Lower Extremity Assessment  Lower Extremity Assessment Generalized weakness  Cervical / Trunk Assessment  Cervical / Trunk Assessment Normal  Bed Mobility  Overal bed mobility Modified Independent  Transfers  Overall transfer level Needs  assistance  Equipment used 2 person hand held assist  Transfers Sit to/from Stand;Stand Pivot Transfers  Sit to Stand Min assist;+2 safety/equipment  Stand pivot transfers Min assist;+2 physical assistance  General transfer comment Min A for steadying assist to stand and transfer to chair to clean stretcher. Pt impulsive and requiring safety cues throughout.  Balance  Overall balance assessment History of Falls;Needs assistance  Sitting-balance support No upper extremity supported  Sitting balance-Leahy Scale Good  Standing balance support Bilateral upper extremity supported  Standing balance-Leahy Scale Poor  Standing balance comment hx of falls  PT - End of Session  Equipment Utilized During Treatment Gait belt  Activity Tolerance Patient tolerated treatment well  Patient left in bed;with call bell/phone within reach (on stretcher in ED)  Nurse Communication Mobility status  PT Assessment  PT Recommendation/Assessment Patient needs continued PT services  PT Visit Diagnosis History of falling (Z91.81);Muscle weakness (generalized) (M62.81);Unsteadiness on feet (R26.81)  PT Problem List Decreased strength;Decreased balance;Decreased mobility;Decreased knowledge of use of DME;Decreased knowledge of precautions  PT Plan  PT Frequency (ACUTE ONLY) Min 3X/week  PT Treatment/Interventions (ACUTE ONLY) DME instruction;Gait training;Therapeutic activities;Functional mobility training;Therapeutic exercise;Balance training;Patient/family education  AM-PAC PT "6 Clicks" Mobility Outcome Measure (Version 2)  Help needed turning from your back to your side while in a flat bed without using bedrails? 4  Help needed moving from lying on your back to sitting on the side of a flat bed without using bedrails? 4  Help needed moving to and from a bed to a chair (including a wheelchair)? 3  Help needed standing up from a chair using your arms (e.g., wheelchair or bedside chair)? 3  Help needed to walk in  hospital room? 3  Help needed climbing 3-5 steps with a railing?  3  6 Click Score 20  Consider Recommendation of  Discharge To: Home with no services  PT Recommendation  Follow Up Recommendations Home health PT;Supervision/Assistance - 24 hour  PT equipment Wheelchair (measurements PT);Wheelchair cushion (measurements PT)  Individuals Consulted  Consulted and Agree with Results and Recommendations Patient  Acute Rehab PT Goals  Patient Stated Goal to get a wheelchair and stop falling  PT Goal Formulation With patient  Time For Goal Achievement 05/22/20  Potential to Achieve Goals Good  PT Time Calculation  PT Start Time (ACUTE ONLY) 1028  PT Stop Time (ACUTE ONLY) 1044  PT Time Calculation (min) (ACUTE ONLY) 16 min  PT General Charges  $$ ACUTE PT VISIT 1 Visit  PT Evaluation  $PT Eval Moderate Complexity 1 Mod  Written Expression  Dominant Hand Right   Pt admitted secondary to problem above with deficits above. Seen with OT for safety as pt in ED on higher stretcher. Pt impulsive throughout and required min A +2 for safety to stand and transfer to chair to get bed linens changed. Pt reports hx of falls, and states he would benefit from [facility]. Recommend WC, however, will need to ensure he can use at group home. Would benefit from HHPT, however, unsure that he will be able to receive. Will continue to follow acutely.   Farley Ly, PT, DPT  Acute Rehabilitation Services  Pager: [phone] Office: [phone]

## 2020-05-08 NOTE — ED Notes (Signed)
Trying to wake patient up enough to call a ride. Pt continues to sleep.

## 2020-05-08 NOTE — ED Notes (Signed)
Breakfast ordered 

## 2020-05-08 NOTE — Discharge Summary (Addendum)
Family Medicine Teaching [facility] Discharge Summary  Patient name: Ronald Roman Medical record number: [id number] Date of birth: 18-Aug-1960 Age: 59 y.o. Gender: male Date of Admission: 05/07/2020  Date of Discharge: 05/08/20 Admitting Physician: Moses Manners, MD  Primary Care Provider: Salli Real, MD Consultants: Neurology   Indication for Hospitalization: Breakthrough seizure  Discharge Diagnoses/Problem List:  Breakthrough seizure Seizure disorder Acute kidney injury Hypertension  Disposition: Group Home   Discharge Condition: Stable   Discharge Exam:  Gen: Alert and oriented to person, place, time and situation, pleasant Cardio: RRR, without murmur Resp: CTAB, without wheezing/rhonchi/rales Abdomen: Normoactive BS, non-tender in all quadrants, no R/G Ext: 2+ DP and radial pulses  Neuro: No focal deficits, involuntary movements concerning for tardive dyskinesia, able to follow commands and answer questions appropriately  Brief Hospital Course:  Ronald Roman is a 59 y.o. male that presented after an unwitnessed seizure. PMH significant for seizure disorder, T2DM, HTN, HLD, paranoid schizophrenia, OSA   Unwitnessed seizure  Hx of seizure disorder Patient brought to the ED after being found outside unresponsive and incontinent of urine. Patient received 1 dose of Narcan from EMS. On admission, patient found to be hypothermic with lactic acid elevated and other labs, including valproic acid level, within normal limits. CT head and CXR unremarkable. Blood and urine cultures were collected, antibiotics were not initiated due to low suspicion for infection. Neurology was consulted and recommended increasing Depakote dosing and outpatient follow-up after discharge. Patient refused EEG. Patient was discharged with Depakote 500mg  in the AM and 1000mg  QHS per neurology recommendations.   Acute kidney injury Creatinine was elevated on admission at 1.56. Baseline appears  to be 1.1. Patient was started on fluids and home Lisinopril was held during admission. Creatinine improved to 1.33 at discharge.  HTN Home medication of lisinopril was held during admission due to initially lower blood pressures. Blood pressures improved with fluid administration. Patient was discharged on Lisinopril 10 mg (home dose was 20 mg).  Items for follow-up: 1. Recommend checking BP trends outpatient as pressures were low on admission. 2. Recommend BMP within 1 week to ensure creatinine improvement. 3. Neurology outpatient follow-up 2-4 weeks after discharge for monitoring as Depakote increased to 500mg  in the AM and 1000mg  QHS. 4. Reduced dose Lisinopril 10mg  on discharge due to AKI.  Titrate up as needed and able. 5. Should have CBC q4 weeks while on Clozapine    Significant Procedures:  None  Significant Labs and Imaging:  Recent Labs  Lab 05/07/20 1028 05/08/20 0500  WBC 8.4 11.4*  HGB 13.5 14.5  HCT 43.2 45.6  PLT 266 283   Recent Labs  Lab 05/07/20 1028 05/08/20 0500  NA 137 140  K 4.6 4.8  CL 107 106  CO2 17* 26  GLUCOSE 185* 94  BUN 26* 20  CREATININE 1.56* 1.33*  CALCIUM 9.2 9.5  ALKPHOS 58  --   AST 23  --   ALT 13  --   ALBUMIN 3.3*  --    CXR 12/15 IMPRESSION: Lungs clear.  Cardiac silhouette normal.  CT Head without contrast 12/15 IMPRESSION: No acute intracranial abnormality.  Results/Tests Pending at Time of Discharge: Final results of blood and urine culture  Discharge Medications:  Allergies as of 05/08/2020      Reactions   Cogentin [benztropine] Other (See Comments)   Per MAR    Penicillins Other (See Comments)   Per MAR - unable to verify patients PCN reaction    Prolixin [  fluphenazine] Other (See Comments)   Per MAR       Medication List    STOP taking these medications   divalproex 500 MG DR tablet Commonly known as: DEPAKOTE Replaced by: divalproex 500 MG 24 hr tablet You also have another medication with the same  name that you need to continue taking as instructed.     TAKE these medications   albuterol 108 (90 Base) MCG/ACT inhaler Commonly known as: VENTOLIN HFA Inhale 2 puffs into the lungs every 4 (four) hours as needed for wheezing or shortness of breath.   amantadine 100 MG capsule Commonly known as: SYMMETREL Take 1 capsule (100 mg total) by mouth 2 (two) times daily.   cloZAPine 100 MG tablet Commonly known as: CLOZARIL Take 150 mg by mouth daily after breakfast.   dapagliflozin propanediol 10 MG Tabs tablet Commonly known as: FARXIGA Take 10 mg by mouth daily after breakfast.   divalproex 500 MG 24 hr tablet Commonly known as: DEPAKOTE ER Take 2 tablets (1,000 mg total) by mouth at bedtime. What changed: You were already taking a medication with the same name, and this prescription was added. Make sure you understand how and when to take each. Replaces: divalproex 500 MG DR tablet   divalproex 500 MG 24 hr tablet Commonly known as: DEPAKOTE ER Take 1 tablet (500 mg total) by mouth every morning. Start taking on: May 09, 2020 What changed:   how much to take  when to take this  Another medication with the same name was removed. Continue taking this medication, and follow the directions you see here.   hydrOXYzine 25 MG tablet Commonly known as: ATARAX/VISTARIL Take 25 mg by mouth 4 (four) times daily.   insulin glargine 100 UNIT/ML injection Commonly known as: LANTUS Inject 0.2 mLs (20 Units total) into the skin at bedtime. What changed: how much to take   lisinopril 20 MG tablet Commonly known as: ZESTRIL Take 0.5 tablets (10 mg total) by mouth daily. What changed: how much to take   metFORMIN 1000 MG tablet Commonly known as: GLUCOPHAGE Take 1,000 mg by mouth 2 (two) times daily with a meal.   QUEtiapine 50 MG tablet Commonly known as: SEROQUEL Take 50 mg by mouth daily at 12 noon.   QUEtiapine 100 MG tablet Commonly known as: SEROQUEL Take 100 mg  by mouth in the morning and at bedtime.   traZODone 50 MG tablet Commonly known as: DESYREL Take 50-100 mg by mouth at bedtime as needed for sleep.   zolpidem 5 MG tablet Commonly known as: AMBIEN Take 5 mg by mouth at bedtime.       Discharge Instructions: Please refer to Patient Instructions section of EMR for full details.  Patient was counseled important signs and symptoms that should prompt return to medical care, changes in medications, dietary instructions, activity restrictions, and follow up appointments.   Follow-Up Appointments:  Follow-up Information    Salli Real, MD. Schedule an appointment as soon as possible for a visit.   Specialty: Internal Medicine Why: Please follow up next week.  Contact information: [address] [name] [address] [phone]               Sabino Dick, DO 05/08/2020, 11:17 AM PGY-1, [facility]  FPTS Upper-Level Resident Addendum   I have independently interviewed and examined the patient. I have discussed the above with the original author and agree with their documentation. My edits for correction/addition/clarification have been made.  Luis Abed, D.O. PGY-3, [facility] 05/08/2020 1:47 PM

## 2020-05-08 NOTE — Progress Notes (Signed)
Occupational Therapy Evaluation Patient Details Name: Ronald Roman MRN: [id number] DOB: 12/15/1960 Today's Date: 05/08/2020    History of Present Illness 59 y.o. male that presented after an unwitnessed seizure. PMH significant for seizure disorder, T2DM, HTN, HLD, paranoid schizophrenia, OSA   Clinical Impression   Pt lives in a group home where he apparently has assistance as needed. Pt states he is able to complete his ADL tasks independently and staff assists with medication management and meals. Pt states he has had numerous falls. Pt would most likely benefit from use of a wc to reduce risk of falls. Would want to confirm with group home that home would be wc accessible. No further OT needs. Recommend DC back to group home.    Follow Up Recommendations  No OT follow up;Supervision/Assistance - 24 hour    Equipment Recommendations  Wheelchair (measurements OT);Wheelchair cushion (measurements OT)    Recommendations for Other Services       Precautions / Restrictions Precautions Precautions: Fall      Mobility Bed Mobility Overal bed mobility: Modified Independent                  Transfers Overall transfer level: Needs assistance   Transfers: Sit to/from Stand;Stand Pivot Transfers Sit to Stand: Min assist;+2 safety/equipment Stand pivot transfers: Min assist            Balance Overall balance assessment: History of Falls;Needs assistance   Sitting balance-Leahy Scale: Good       Standing balance-Leahy Scale: Poor Standing balance comment: hx of falls                           ADL either performed or assessed with clinical judgement   ADL Overall ADL's : Needs assistance/impaired                                     Functional mobility during ADLs: Minimal assistance;Cueing for sequencing (steady assist) General ADL Comments: Overall S/set up. Min buard sit - stand during ADL     Vision         Perception      Praxis      Pertinent Vitals/Pain Pain Assessment: Faces Faces Pain Scale: No hurt     Hand Dominance Right   Extremity/Trunk Assessment Upper Extremity Assessment Upper Extremity Assessment: Overall WFL for tasks assessed   Lower Extremity Assessment Lower Extremity Assessment: Defer to PT evaluation   Cervical / Trunk Assessment Cervical / Trunk Assessment: Normal   Communication Communication Communication: No difficulties   Cognition Arousal/Alertness: Awake/alert Behavior During Therapy: Restless;Impulsive Overall Cognitive Status: No family/caregiver present to determine baseline cognitive functioning                                 General Comments: Most likely close to baseline   General Comments       Exercises     Shoulder Instructions      Home Living Family/patient expects to be discharged to:: Private residence Living Arrangements: Group Home Available Help at Discharge: Available 24 hours/day Type of Home: Group Home Home Access:  (unsure; pt states several steps; chart from previous admission says level entry)     Home Layout: One level     Bathroom Shower/Tub: Producer, television/film/video: Standard Bathroom Accessibility:  Yes   Home Equipment: Shower seat   Additional Comments: questionable historian      Prior Functioning/Environment Level of Independence: Independent with assistive device(s)        Comments: states he does not se a RW and has had multiple falls        OT Problem List: Impaired balance (sitting and/or standing);Decreased safety awareness      OT Treatment/Interventions:      OT Goals(Current goals can be found in the care plan section) Acute Rehab OT Goals Patient Stated Goal: to get a wheelchair and stop falling OT Goal Formulation: All assessment and education complete, DC therapy  OT Frequency:     Barriers to D/C:            Co-evaluation PT/OT/SLP Co-Evaluation/Treatment:  Yes Reason for Co-Treatment: Necessary to address cognition/behavior during functional activity;For patient/therapist safety   OT goals addressed during session: ADL's and self-care      AM-PAC OT "6 Clicks" Daily Activity     Outcome Measure Help from another person eating meals?: None Help from another person taking care of personal grooming?: None Help from another person toileting, which includes using toliet, bedpan, or urinal?: A Little Help from another person bathing (including washing, rinsing, drying)?: A Little Help from another person to put on and taking off regular upper body clothing?: A Little Help from another person to put on and taking off regular lower body clothing?: A Little 6 Click Score: 20   End of Session Equipment Utilized During Treatment: Gait belt Nurse Communication: Mobility status;Other (comment) (DC recommendations)  Activity Tolerance: Patient tolerated treatment well Patient left: in bed;with call bell/phone within reach  OT Visit Diagnosis: Unsteadiness on feet (R26.81);Other abnormalities of gait and mobility (R26.89);Other symptoms and signs involving cognitive function;History of falling (Z91.81)                Time: 2993-7169 OT Time Calculation (min): 23 min Charges:  OT General Charges $OT Visit: 1 Visit OT Evaluation $OT Eval Moderate Complexity: 1 Mod  Legion Discher, OT/L   Acute OT Clinical Specialist Acute Rehabilitation Services Pager [phone] Office [phone]   [facility] 05/08/2020, 11:11 AM

## 2020-05-08 NOTE — ED Notes (Signed)
Male external catheter in place and hooked to suction canister.

## 2020-05-08 NOTE — Progress Notes (Signed)
Patient suffers from multiple falls, instability which impairs their ability to perform daily activities like ambulation, ADLs, etc in the home.  A walker alone will not resolve the issues with performing activities of daily living. A wheelchair will allow patient to safely perform daily activities.  The patient can self propel in the home or has a caregiver who can provide assistance.   Farley Ly, PT, DPT  Acute Rehabilitation Services  Pager: [phone] Office: [phone]

## 2020-05-08 NOTE — ED Notes (Signed)
Lunch Tray Ordered @ 1032. 

## 2020-05-08 NOTE — Discharge Instructions (Signed)
You were hospitalized at [facility] due to a seizure. You were also found to have an acute kidney injury that we suspect was from dehydration. You were given fluids while in the hospital and your seizure medication was adjusted. You will need to take 500 mg (1 tablet) Depakote in the morning, and 1000 mg (2 tablets) at night. We also decreased your blood pressure medication. Please take only 10 mg (half a tablet) of Lisinopril daily. You will need to follow up with your primary care doctor in the next week. They will likely take blood work at that time and can adjust your medication if needed. You will also likely need to get blood work in the next 2-3 weeks to check the level of Depakote to make sure it is within the expected range. You will also need to follow up with the Neurologist in the next 2-3 weeks, they may adjust your medications further if necessary. We are so glad you are feeling better. Thank you for allowing [place] to take care of you.  Take care, [facility] team    Seizure, Adult A seizure is a sudden burst of abnormal electrical activity in the brain. Seizures usually last from 30 seconds to 2 minutes. They can cause many different symptoms. Usually, seizures are not harmful unless they last a long time. What are the causes? Common causes of this condition include:  Fever or infection.  Conditions that affect the brain, such as: ? A brain abnormality that you were born with. ? A brain or head injury. ? Bleeding in the brain. ? A tumor. ? Stroke. ? Brain disorders such as autism or cerebral palsy.  Low blood sugar.  Conditions that are passed from parent to child (are inherited).  Problems with substances, such as: ? Having a reaction to a drug or a medicine. ? Suddenly stopping the use of a substance (withdrawal). In some cases, the cause may not be known. A person who has repeated seizures over time without a clear cause has a condition called  epilepsy. What increases the risk? You are more likely to get this condition if you have:  A family history of epilepsy.  Had a seizure in the past.  A brain disorder.  A history of head injury, lack of oxygen at birth, or strokes. What are the signs or symptoms? There are many types of seizures. The symptoms vary depending on the type of seizure you have. Examples of symptoms during a seizure include:  Shaking (convulsions).  Stiffness in the body.  Passing out (losing consciousness).  Head nodding.  Staring.  Not responding to sound or touch.  Loss of bladder control and bowel control. Some people have symptoms right before and right after a seizure happens. Symptoms before a seizure may include:  Fear.  Worry (anxiety).  Feeling like you may vomit (nauseous).  Feeling like the room is spinning (vertigo).  Feeling like you saw or heard something before (dj vu).  Odd tastes or smells.  Changes in how you see. You may see flashing lights or spots. Symptoms after a seizure happens can include:  Confusion.  Sleepiness.  Headache.  Weakness on one side of the body. How is this treated? Most seizures will stop on their own in under 5 minutes. In these cases, no treatment is needed. Seizures that last longer than 5 minutes will usually need treatment. Treatment can include:  Medicines given through an IV tube.  Avoiding things that are known to  cause your seizures. These can include medicines that you take for another condition.  Medicines to treat epilepsy.  Surgery to stop the seizures. This may be needed if medicines do not help. Follow these instructions at home: Medicines  Take over-the-counter and prescription medicines only as told by your doctor.  Do not eat or drink anything that may keep your medicine from working, such as alcohol. Activity  Do not do any activities that would be dangerous if you had another seizure, like driving or  swimming. Wait until your doctor says it is safe for you to do them.  If you live in the [place] ask your local DMV (department of motor vehicles) when you can drive.  Get plenty of rest. Teaching others Teach friends and family what to do when you have a seizure. They should:  Lay you on the ground.  Protect your head and body.  Loosen any tight clothing around your neck.  Turn you on your side.  Not hold you down.  Not put anything into your mouth.  Know whether or not you need emergency care.  Stay with you until you are better.  General instructions  Contact your doctor each time you have a seizure.  Avoid anything that gives you seizures.  Keep a seizure diary. Write down: ? What you think caused each seizure. ? What you remember about each seizure.  Keep all follow-up visits as told by your doctor. This is important. Contact a doctor if:  You have another seizure.  You have seizures more often.  There is any change in what happens during your seizures.  You keep having seizures with treatment.  You have symptoms of being sick or having an infection. Get help right away if:  You have a seizure that: ? Lasts longer than 5 minutes. ? Is different than seizures you had before. ? Makes it harder to breathe. ? Happens after you hurt your head.  You have any of these symptoms after a seizure: ? Not being able to speak. ? Not being able to use a part of your body. ? Confusion. ? A bad headache.  You have two or more seizures in a row.  You do not wake up right after a seizure.  You get hurt during a seizure. These symptoms may be an emergency. Do not wait to see if the symptoms will go away. Get medical help right away. Call your local emergency services (911 in the [place] Do not drive yourself to the hospital. Summary  Seizures usually last from 30 seconds to 2 minutes. Usually, they are not harmful unless they last a long time.  Do not eat or drink  anything that may keep your medicine from working, such as alcohol.  Teach friends and family what to do when you have a seizure.  Contact your doctor each time you have a seizure. This information is not intended to replace advice given to you by your health care provider. Make sure you discuss any questions you have with your health care provider. Document Revised: 07/28/2018 Document Reviewed: 07/28/2018 Elsevier Patient Education  2020 [employer].

## 2020-05-09 NOTE — ED Notes (Signed)
CNA assisted patient with getting dressed and IV removal. Patient left before this nurse was able to give discharge paperwork, discharge instructions or medication review/instructions as noted on D/C status.

## 2020-05-09 NOTE — Progress Notes (Signed)
CSW spoke with Ky Barban at [facility] who states the patient did not return to the home yesterday. Wille Celeste states a police report was filed on the patient's behalf.   CSW notified Martie Lee, ED Director of information.  Edwin Dada, MSW, LCSW-A Transitions of Care  Clinical Social Worker I [facility] : [facility] Emergency Departments  Medical ICU [phone]

## 2020-05-12 LAB — CULTURE, BLOOD (ROUTINE X 2)
Culture: NO GROWTH
Special Requests: ADEQUATE

## 2020-05-20 ENCOUNTER — Other Ambulatory Visit: Payer: Self-pay

## 2020-05-20 ENCOUNTER — Emergency Department (HOSPITAL_COMMUNITY): Payer: Medicaid Other

## 2020-05-20 ENCOUNTER — Encounter (HOSPITAL_COMMUNITY): Payer: Self-pay

## 2020-05-20 ENCOUNTER — Emergency Department (HOSPITAL_COMMUNITY)
Admission: EM | Admit: 2020-05-20 | Discharge: 2020-05-20 | Disposition: A | Discharge status: Home / Self Care | Payer: Medicaid Other | Attending: Emergency Medicine | Admitting: Emergency Medicine

## 2020-05-20 DIAGNOSIS — Z7984 Long term (current) use of oral hypoglycemic drugs: Secondary | ICD-10-CM | POA: Insufficient documentation

## 2020-05-20 DIAGNOSIS — Z79899 Other long term (current) drug therapy: Secondary | ICD-10-CM | POA: Diagnosis not present

## 2020-05-20 DIAGNOSIS — R4182 Altered mental status, unspecified: Secondary | ICD-10-CM | POA: Diagnosis not present

## 2020-05-20 DIAGNOSIS — F17210 Nicotine dependence, cigarettes, uncomplicated: Secondary | ICD-10-CM | POA: Diagnosis not present

## 2020-05-20 DIAGNOSIS — R569 Unspecified convulsions: Secondary | ICD-10-CM

## 2020-05-20 DIAGNOSIS — I10 Essential (primary) hypertension: Secondary | ICD-10-CM | POA: Diagnosis not present

## 2020-05-20 DIAGNOSIS — E119 Type 2 diabetes mellitus without complications: Secondary | ICD-10-CM | POA: Insufficient documentation

## 2020-05-20 DIAGNOSIS — Z794 Long term (current) use of insulin: Secondary | ICD-10-CM | POA: Diagnosis not present

## 2020-05-20 LAB — CBC WITH DIFFERENTIAL/PLATELET
Abs Immature Granulocytes: 0.02 10*3/uL (ref 0.00–0.07)
Basophils Absolute: 0 10*3/uL (ref 0.0–0.1)
Basophils Relative: 0 %
Eosinophils Absolute: 0.1 10*3/uL (ref 0.0–0.5)
Eosinophils Relative: 1 %
HCT: 44.3 % (ref 39.0–52.0)
Hemoglobin: 14.1 g/dL (ref 13.0–17.0)
Immature Granulocytes: 0 %
Lymphocytes Relative: 35 %
Lymphs Abs: 2.4 10*3/uL (ref 0.7–4.0)
MCH: 26.8 pg (ref 26.0–34.0)
MCHC: 31.8 g/dL (ref 30.0–36.0)
MCV: 84.2 fL (ref 80.0–100.0)
Monocytes Absolute: 0.6 10*3/uL (ref 0.1–1.0)
Monocytes Relative: 9 %
Neutro Abs: 3.8 10*3/uL (ref 1.7–7.7)
Neutrophils Relative %: 55 %
Platelets: 195 10*3/uL (ref 150–400)
RBC: 5.26 MIL/uL (ref 4.22–5.81)
RDW: 15.6 % — ABNORMAL HIGH (ref 11.5–15.5)
WBC: 7 10*3/uL (ref 4.0–10.5)
nRBC: 0 % (ref 0.0–0.2)

## 2020-05-20 LAB — BASIC METABOLIC PANEL
Anion gap: 10 (ref 5–15)
BUN: 24 mg/dL — ABNORMAL HIGH (ref 6–20)
CO2: 22 mmol/L (ref 22–32)
Calcium: 9.5 mg/dL (ref 8.9–10.3)
Chloride: 103 mmol/L (ref 98–111)
Creatinine, Ser: 1.57 mg/dL — ABNORMAL HIGH (ref 0.61–1.24)
GFR, Estimated: 50 mL/min — ABNORMAL LOW (ref >60)
Glucose, Bld: 93 mg/dL (ref 70–99)
Potassium: 4.8 mmol/L (ref 3.5–5.1)
Sodium: 135 mmol/L (ref 135–145)

## 2020-05-20 LAB — CBG MONITORING, ED: Glucose-Capillary: 95 mg/dL (ref 70–99)

## 2020-05-20 LAB — VALPROIC ACID LEVEL: Valproic Acid Lvl: 104 ug/mL — ABNORMAL HIGH (ref 50.0–100.0)

## 2020-05-20 LAB — TROPONIN I (HIGH SENSITIVITY): Troponin I (High Sensitivity): 5 ng/L (ref <18)

## 2020-05-20 MED ORDER — LORAZEPAM 2 MG/ML IJ SOLN
1.0000 mg | Freq: Once | INTRAMUSCULAR | Status: AC
  Administered 2020-05-20: 12:00:00 1 mg via INTRAVENOUS
  Filled 2020-05-20: qty 1

## 2020-05-20 NOTE — ED Notes (Signed)
Pt given [place] sandwich bag and diet sprite

## 2020-05-20 NOTE — ED Triage Notes (Signed)
Pt bib ems from [facility] for witnessed seizure by staff, described as full body shaking for abour 8 mins. When EMS arrived pt was alert and oriented x4. Pt denies hx of seizures. VSS. Pt arrives to ed alert, oriented x3, when asked what happened pt states " I fell because I had a heart attack" pt denies chest pain or sob. CBG 160

## 2020-05-20 NOTE — ED Notes (Signed)
Pt ambulated to the restroom down the hall with a steady gait.

## 2020-05-20 NOTE — Discharge Instructions (Addendum)
You were seen today for an episode of seizure-like activity.  Your work-up is reassuring.  Follow-up with neurology as previously recommended.  Continue to take your Depakote as directed.

## 2020-05-20 NOTE — Progress Notes (Signed)
CSW received consult for transport.  CSW will meet Pt in room once Safe Transport calls to prevent any possible eloping. CSW informed RN [place] of d/c plan.   05/20/20 1543  TOC ED Mini Assessment  TOC Time spent with patient (minutes): 25  PING Used in TOC Assessment No  Admission or Readmission Diverted Yes  Interventions which prevented an admission or readmission Transportation Screening  What brought you to the Emergency Department?  seizure  Barriers to Discharge No Barriers Identified  Barrier interventions Transportation arranged via Set designer 1 Ronald Roman  Key Contact 2 [facility]  Spoke with Ronald/[facility]  Contact Date 05/20/20  Contact time 1546  Contact Phone Number [phone]  Call outcome Group home ([facility]) will be expecting Pt's return via [employer]. CSW will update group home once Pt is inroute.

## 2020-05-20 NOTE — ED Provider Notes (Signed)
[facility] EMERGENCY DEPARTMENT Provider Note   CSN: [id number] Arrival date & time: 05/20/20  1132     History Chief Complaint  Patient presents with  . Seizures    Ronald Roman is a 59 y.o. male.  HPI     This a 58 year old male with a history of diabetes, hypertension, hyperlipidemia, paranoid schizophrenia, seizure disorder who presents with seizure-like activity.  Per group, he was found having whole body shaking for approximately 8 minutes.  He was awake, alert upon EMS arrival.  When I asked the patient what happened he states "I fell because I had a heart attack."  He denies any chest pain or shortness of breath.  However, he does not really answer directed questions appropriately.  He is oriented x3 but is very sleepy and requires arousal frequently.  Level five caveat for acuity of condition and altered mental status.  Of note, patient was recent hospitalization for altered mental status.  Was evaluated by neurology at that time.  Recommended increasing Depakote.  No EEG at that time as patient refused.  He has one prior normal EEG in our system.  Past Medical History:  Diagnosis Date  . Diabetes mellitus without complication (HCC)   . GERD (gastroesophageal reflux disease)   . Hyperlipidemia   . Hypertension   . Paranoid schizophrenia (HCC)   . Sleep apnea   . Uric acid nephrolithiasis     Patient Active Problem List   Diagnosis Date Noted  . Seizure (HCC) 05/07/2020  . Altered mental status 02/09/2020  . Acute psychosis (HCC)   . Leukocytosis 02/06/2015  . Sleep apnea 02/05/2015  . AKI (acute kidney injury) (HCC) 02/04/2015  . Seizure disorder (HCC) 02/04/2015  . Acute encephalopathy 02/04/2015  . Diabetes mellitus without complication (HCC)   . Hyperlipidemia   . Hypertension   . Paranoid schizophrenia (HCC)     History reviewed. No pertinent surgical history.     No family history on file.  Social History   Tobacco Use   . Smoking status: Current Every Day Smoker    Packs/day: 1.00    Types: Cigarettes  . Smokeless tobacco: Never Used  Substance Use Topics  . Alcohol use: No  . Drug use: No    Home Medications Prior to Admission medications   Medication Sig Start Date End Date Taking? Authorizing Provider  albuterol (PROVENTIL HFA;VENTOLIN HFA) 108 (90 BASE) MCG/ACT inhaler Inhale 2 puffs into the lungs every 4 (four) hours as needed for wheezing or shortness of breath. Patient not taking: Reported on 05/07/2020 04/21/15   Hayden Rasmussen, NP  amantadine (SYMMETREL) 100 MG capsule Take 1 capsule (100 mg total) by mouth 2 (two) times daily. 04/26/16   Oneta Rack, NP  cloZAPine (CLOZARIL) 100 MG tablet Take 150 mg by mouth daily after breakfast.     [provider]  dapagliflozin propanediol (FARXIGA) 10 MG TABS tablet Take 10 mg by mouth daily after breakfast.    [provider]  divalproex (DEPAKOTE ER) 500 MG 24 hr tablet Take 2 tablets (1,000 mg total) by mouth at bedtime. 05/08/20   Sabino Dick, DO  divalproex (DEPAKOTE ER) 500 MG 24 hr tablet Take 1 tablet (500 mg total) by mouth every morning. 05/09/20   Sabino Dick, DO  hydrOXYzine (ATARAX/VISTARIL) 25 MG tablet Take 25 mg by mouth 4 (four) times daily. 10/17/19   [provider]  insulin glargine (LANTUS) 100 UNIT/ML injection Inject 0.2 mLs (20 Units total) into  the skin at bedtime. Patient taking differently: Inject 25 Units into the skin at bedtime. 04/26/16   Oneta Rack, NP  lisinopril (ZESTRIL) 20 MG tablet Take 0.5 tablets (10 mg total) by mouth daily. 05/08/20   Sabino Dick, DO  metFORMIN (GLUCOPHAGE) 1000 MG tablet Take 1,000 mg by mouth 2 (two) times daily with a meal.    [provider]  QUEtiapine (SEROQUEL) 100 MG tablet Take 100 mg by mouth in the morning and at bedtime.    [provider]  QUEtiapine (SEROQUEL) 50 MG tablet Take 50 mg by mouth daily at 12 noon.     [provider]  traZODone (DESYREL) 50 MG tablet Take 50-100 mg by mouth at bedtime as needed for sleep.    [provider]  zolpidem (AMBIEN) 5 MG tablet Take 5 mg by mouth at bedtime.    [provider]    Allergies    Cogentin [benztropine], Penicillins, and Prolixin [fluphenazine]  Review of Systems   Review of Systems  Reason unable to perform ROS: Unable to perform full review of systems as patient does not answer questions consistently.  Respiratory: Negative for shortness of breath.   Cardiovascular: Negative for chest pain.  Neurological: Positive for seizures.  All other systems reviewed and are negative.   Physical Exam Updated Vital Signs BP 116/82   Pulse 73   Temp 99.8 F (37.7 C) (Oral)   Resp 18   Ht 1.753 m (5\' 9" )   Wt 87.5 kg   SpO2 98%   BMI 28.49 kg/m   Physical Exam Vitals and nursing note reviewed.  Constitutional:      Appearance: He is well-developed and well-nourished. He is not ill-appearing.  HENT:     Head: Normocephalic and atraumatic.     Comments: Keloid scarring noted over the head    Mouth/Throat:     Mouth: Mucous membranes are moist.  Eyes:     Pupils: Pupils are equal, round, and reactive to light.  Cardiovascular:     Rate and Rhythm: Normal rate and regular rhythm.     Heart sounds: Normal heart sounds. No murmur heard.   Pulmonary:     Effort: Pulmonary effort is normal. No respiratory distress.     Breath sounds: Normal breath sounds. No wheezing.  Abdominal:     General: Bowel sounds are normal.     Palpations: Abdomen is soft.     Tenderness: There is no abdominal tenderness. There is no rebound.  Musculoskeletal:        General: No edema.     Cervical back: Neck supple.     Right lower leg: No edema.     Left lower leg: No edema.  Lymphadenopathy:     Cervical: No cervical adenopathy.  Skin:    General: Skin is warm and dry.  Neurological:     Mental Status: He is alert and oriented  to person, place, and time.     Comments: Sleepy but arousable and oriented x3, at times does not answer questions appropriately, moves all four extremities, follows commands, rhythmic clenching of his jaw noted  Psychiatric:        Mood and Affect: Mood and affect normal.     Comments: Bizarre affect     ED Results / Procedures / Treatments   Labs (all labs ordered are listed, but only abnormal results are displayed) Labs Reviewed  CBC WITH DIFFERENTIAL/PLATELET - Abnormal; Notable for the following components:  Result Value   RDW 15.6 (*)    All other components within normal limits  BASIC METABOLIC PANEL - Abnormal; Notable for the following components:   BUN 24 (*)    Creatinine, Ser 1.57 (*)    GFR, Estimated 50 (*)    All other components within normal limits  VALPROIC ACID LEVEL - Abnormal; Notable for the following components:   Valproic Acid Lvl 104 (*)    All other components within normal limits  CBG MONITORING, ED  TROPONIN I (HIGH SENSITIVITY)    EKG EKG Interpretation  Date/Time:  Tuesday May 20 2020 11:41:21 EST Ventricular Rate:  100 PR Interval:    QRS Duration: 81 QT Interval:  312 QTC Calculation: 403 R Axis:   67 Text Interpretation: Sinus tachycardia Confirmed by Ross Marcus ([id number]) on 05/20/2020 1:44:16 PM   Radiology DG Chest Portable 1 View  Result Date: 05/20/2020 CLINICAL DATA:  Seizure with chest pain EXAM: PORTABLE CHEST 1 VIEW COMPARISON:  May 07, 2020 FINDINGS: No edema or airspace opacity. Heart is upper normal in size with pulmonary vascularity normal. No pneumothorax. No adenopathy. No bone lesions. IMPRESSION: Lungs clear.  Heart upper normal in size.  No pneumothorax. Electronically Signed   By: Bretta Bang III M.D.   On: 05/20/2020 12:17    Procedures Procedures (including critical care time)  Medications Ordered in ED Medications  LORazepam (ATIVAN) injection 1 mg (1 mg Intravenous Given 05/20/20 1159)     ED Course  I have reviewed the triage vital signs and the nursing notes.  Pertinent labs & imaging results that were available during my care of the patient were reviewed by me and considered in my medical decision making (see chart for details).    MDM Rules/Calculators/A&P                          Patient presents with seizure-like activity.  On my evaluation he is not fully oriented and seems somewhat altered.  Question postictal state versus altered mental status related to ongoing schizophrenia.  Seems that he presents this way fairly frequently.  No recent EEGs.  He did have adjustment of his Depakote on his last hospitalization.  Will obtain labs and monitor closely.  He was given Ativan 2 prevent further seizures.  Lab work-up is largely reassuring.  No indication for CT as he is nonfocal on repeat examination and patient progressively returned to baseline.  He is able to ambulate and eat without difficulty.  Depakote level is just slightly supratherapeutic at 104.  Recommend follow-up with neurology as an outpatient as previously recommended during prior hospitalization.  After history, exam, and medical workup I feel the patient has been appropriately medically screened and is safe for discharge home. Pertinent diagnoses were discussed with the patient. Patient was given return precautions.   Final Clinical Impression(s) / ED Diagnoses Final diagnoses:  Altered mental status, unspecified altered mental status type  Seizure-like activity [facility])    Rx / DC Orders ED Discharge Orders    None       Everlie Eble, Mayer Masker, MD 05/20/20 1520

## 2020-06-22 ENCOUNTER — Encounter (HOSPITAL_COMMUNITY): Payer: Self-pay | Admitting: Emergency Medicine

## 2020-06-22 ENCOUNTER — Other Ambulatory Visit: Payer: Self-pay

## 2020-06-22 ENCOUNTER — Emergency Department (HOSPITAL_COMMUNITY)
Admission: EM | Admit: 2020-06-22 | Discharge: 2020-06-22 | Disposition: A | Discharge status: Home / Self Care | Payer: Medicaid Other | Attending: Emergency Medicine | Admitting: Emergency Medicine

## 2020-06-22 ENCOUNTER — Emergency Department (HOSPITAL_COMMUNITY): Payer: Medicaid Other

## 2020-06-22 DIAGNOSIS — Z794 Long term (current) use of insulin: Secondary | ICD-10-CM | POA: Insufficient documentation

## 2020-06-22 DIAGNOSIS — F17210 Nicotine dependence, cigarettes, uncomplicated: Secondary | ICD-10-CM | POA: Insufficient documentation

## 2020-06-22 DIAGNOSIS — Z20822 Contact with and (suspected) exposure to covid-19: Secondary | ICD-10-CM | POA: Insufficient documentation

## 2020-06-22 DIAGNOSIS — Z7984 Long term (current) use of oral hypoglycemic drugs: Secondary | ICD-10-CM | POA: Insufficient documentation

## 2020-06-22 DIAGNOSIS — R531 Weakness: Secondary | ICD-10-CM | POA: Diagnosis not present

## 2020-06-22 DIAGNOSIS — I10 Essential (primary) hypertension: Secondary | ICD-10-CM | POA: Insufficient documentation

## 2020-06-22 DIAGNOSIS — E119 Type 2 diabetes mellitus without complications: Secondary | ICD-10-CM | POA: Insufficient documentation

## 2020-06-22 DIAGNOSIS — Z79899 Other long term (current) drug therapy: Secondary | ICD-10-CM | POA: Diagnosis not present

## 2020-06-22 LAB — COMPREHENSIVE METABOLIC PANEL
ALT: 13 U/L (ref 0–44)
AST: 24 U/L (ref 15–41)
Albumin: 3.4 g/dL — ABNORMAL LOW (ref 3.5–5.0)
Alkaline Phosphatase: 61 U/L (ref 38–126)
Anion gap: 9 (ref 5–15)
BUN: 31 mg/dL — ABNORMAL HIGH (ref 6–20)
CO2: 24 mmol/L (ref 22–32)
Calcium: 8.8 mg/dL — ABNORMAL LOW (ref 8.9–10.3)
Chloride: 102 mmol/L (ref 98–111)
Creatinine, Ser: 1.75 mg/dL — ABNORMAL HIGH (ref 0.61–1.24)
GFR, Estimated: 44 mL/min — ABNORMAL LOW (ref >60)
Glucose, Bld: 139 mg/dL — ABNORMAL HIGH (ref 70–99)
Potassium: 5.1 mmol/L (ref 3.5–5.1)
Sodium: 135 mmol/L (ref 135–145)
Total Bilirubin: 0.4 mg/dL (ref 0.3–1.2)
Total Protein: 7.2 g/dL (ref 6.5–8.1)

## 2020-06-22 LAB — CBC WITH DIFFERENTIAL/PLATELET
Abs Immature Granulocytes: 0.05 10*3/uL (ref 0.00–0.07)
Basophils Absolute: 0 10*3/uL (ref 0.0–0.1)
Basophils Relative: 0 %
Eosinophils Absolute: 0.1 10*3/uL (ref 0.0–0.5)
Eosinophils Relative: 1 %
HCT: 45.3 % (ref 39.0–52.0)
Hemoglobin: 14.7 g/dL (ref 13.0–17.0)
Immature Granulocytes: 1 %
Lymphocytes Relative: 34 %
Lymphs Abs: 2.9 10*3/uL (ref 0.7–4.0)
MCH: 27.2 pg (ref 26.0–34.0)
MCHC: 32.5 g/dL (ref 30.0–36.0)
MCV: 83.9 fL (ref 80.0–100.0)
Monocytes Absolute: 1 10*3/uL (ref 0.1–1.0)
Monocytes Relative: 12 %
Neutro Abs: 4.5 10*3/uL (ref 1.7–7.7)
Neutrophils Relative %: 52 %
Platelets: 130 10*3/uL — ABNORMAL LOW (ref 150–400)
RBC: 5.4 MIL/uL (ref 4.22–5.81)
RDW: 16.3 % — ABNORMAL HIGH (ref 11.5–15.5)
WBC: 8.5 10*3/uL (ref 4.0–10.5)
nRBC: 0 % (ref 0.0–0.2)

## 2020-06-22 LAB — ACETAMINOPHEN LEVEL: Acetaminophen (Tylenol), Serum: <10 ug/mL — ABNORMAL LOW (ref 10–30)

## 2020-06-22 LAB — PROTIME-INR
INR: 1 (ref 0.8–1.2)
Prothrombin Time: 12.7 seconds (ref 11.4–15.2)

## 2020-06-22 LAB — ETHANOL: Alcohol, Ethyl (B): <10 mg/dL (ref <10)

## 2020-06-22 LAB — VALPROIC ACID LEVEL: Valproic Acid Lvl: 90 ug/mL (ref 50.0–100.0)

## 2020-06-22 LAB — CBG MONITORING, ED: Glucose-Capillary: 168 mg/dL — ABNORMAL HIGH (ref 70–99)

## 2020-06-22 LAB — SALICYLATE LEVEL: Salicylate Lvl: <7 mg/dL — ABNORMAL LOW (ref 7.0–30.0)

## 2020-06-22 MED ORDER — SODIUM CHLORIDE 0.9 % IV BOLUS
1000.0000 mL | Freq: Once | INTRAVENOUS | Status: AC
  Administered 2020-06-22: 1000 mL via INTRAVENOUS

## 2020-06-22 NOTE — ED Notes (Signed)
Group home called. No transportation available tonight.

## 2020-06-22 NOTE — Discharge Instructions (Addendum)
-  Only abnormal blood test today was your platelet count.  It was slightly low.  You should follow-up with your primary care doctor about this.  -Your head CT is negative for any acute or traumatic findings.  -Continue normal medications  -Follow-up with primary care doctor in the next 2 to 5 days.  -You have a Covid test in process.  It should result in 6 to 24 hours.  If it is positive you will receive a phone call.  Otherwise your results will be available in your MyChart.  You should isolate until you have a test result.  If your Covid positive this is a virus and antibiotics are not needed.  You can treat your symptoms as they arise with over the counter medications.  Return to emergency room given new or worsening symptoms.
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
## 2020-06-22 NOTE — ED Notes (Signed)
Safe transport called 

## 2020-06-22 NOTE — ED Notes (Signed)
Entry below made in error.

## 2020-06-22 NOTE — ED Triage Notes (Signed)
Per EMS, patient from group home, called EMS as staff reports patient is weak because he couldn't get off the couch for three hours while he was watching football. Sleeping upon EMS arrival. Stood with EMS. Hx schizophrenia.

## 2020-06-22 NOTE — ED Notes (Signed)
Pt states that he is not able to give a urine specimen at this time, RN aware.

## 2020-06-22 NOTE — ED Notes (Signed)
Safe transport refused to take patient due to COVID test results not being back. [facility] called for transport.

## 2020-06-23 LAB — SARS CORONAVIRUS 2 (TAT 6-24 HRS): SARS Coronavirus 2: NEGATIVE

## 2020-09-29 ENCOUNTER — Ambulatory Visit (INDEPENDENT_AMBULATORY_CARE_PROVIDER_SITE_OTHER): Payer: Medicaid Other | Admitting: Podiatry

## 2020-09-29 ENCOUNTER — Encounter: Payer: Self-pay | Admitting: Podiatry

## 2020-09-29 ENCOUNTER — Other Ambulatory Visit: Payer: Self-pay

## 2020-09-29 DIAGNOSIS — M79674 Pain in right toe(s): Secondary | ICD-10-CM | POA: Diagnosis not present

## 2020-09-29 DIAGNOSIS — M79675 Pain in left toe(s): Secondary | ICD-10-CM | POA: Diagnosis not present

## 2020-09-29 DIAGNOSIS — N179 Acute kidney failure, unspecified: Secondary | ICD-10-CM

## 2020-09-29 DIAGNOSIS — B351 Tinea unguium: Secondary | ICD-10-CM

## 2020-09-29 DIAGNOSIS — E119 Type 2 diabetes mellitus without complications: Secondary | ICD-10-CM

## 2020-09-29 NOTE — Progress Notes (Signed)
This patient returns to my office for at risk foot care.  This patient requires this care by a professional since this patient will be at risk due to having diabetes and kidney injury.  This patient is unable to cut nails himself since the patient cannot reach his nails.These nails are painful walking and wearing shoes.  Patient has not been seen in over 1 and a half years.  This patient presents for at risk foot care today.  General Appearance  Alert, conversant and in no acute stress.  Vascular  Dorsalis pedis and posterior tibial  pulses are palpable  bilaterally.  Capillary return is within normal limits  bilaterally. Temperature is within normal limits  bilaterally.  Neurologic  Senn-Weinstein monofilament wire test within normal limits  bilaterally. Muscle power within normal limits bilaterally.  Nails Thick disfigured discolored nails with subungual debris  1 right and 5 left.. No evidence of bacterial infection or drainage bilaterally.  Orthopedic  No limitations of motion  feet .  No crepitus or effusions noted.  No bony pathology or digital deformities noted.  Skin  normotropic skin with no porokeratosis noted bilaterally.  No signs of infections or ulcers noted.     Onychomycosis  Pain in right toes  Pain in left toes  Consent was obtained for treatment procedures.   Mechanical debridement of nails 1-5  bilaterally performed with a nail nipper.  Filed with dremel without incident.    Return office visit                     Told patient to return for periodic foot care and evaluation due to potential at risk complications.   Helane Gunther DPM

## 2020-10-05 ENCOUNTER — Emergency Department (HOSPITAL_COMMUNITY)
Admission: EM | Admit: 2020-10-05 | Discharge: 2020-10-05 | Disposition: A | Discharge status: Home / Self Care | Payer: Medicaid Other | Attending: Emergency Medicine | Admitting: Emergency Medicine

## 2020-10-05 ENCOUNTER — Other Ambulatory Visit: Payer: Self-pay

## 2020-10-05 ENCOUNTER — Encounter (HOSPITAL_COMMUNITY): Payer: Self-pay | Admitting: Emergency Medicine

## 2020-10-05 ENCOUNTER — Emergency Department (HOSPITAL_COMMUNITY): Payer: Medicaid Other

## 2020-10-05 DIAGNOSIS — Z7984 Long term (current) use of oral hypoglycemic drugs: Secondary | ICD-10-CM | POA: Diagnosis not present

## 2020-10-05 DIAGNOSIS — I10 Essential (primary) hypertension: Secondary | ICD-10-CM | POA: Diagnosis not present

## 2020-10-05 DIAGNOSIS — Z79899 Other long term (current) drug therapy: Secondary | ICD-10-CM | POA: Diagnosis not present

## 2020-10-05 DIAGNOSIS — E119 Type 2 diabetes mellitus without complications: Secondary | ICD-10-CM | POA: Insufficient documentation

## 2020-10-05 DIAGNOSIS — R569 Unspecified convulsions: Secondary | ICD-10-CM

## 2020-10-05 DIAGNOSIS — Z794 Long term (current) use of insulin: Secondary | ICD-10-CM | POA: Insufficient documentation

## 2020-10-05 DIAGNOSIS — G40909 Epilepsy, unspecified, not intractable, without status epilepticus: Secondary | ICD-10-CM | POA: Diagnosis present

## 2020-10-05 DIAGNOSIS — F17210 Nicotine dependence, cigarettes, uncomplicated: Secondary | ICD-10-CM | POA: Insufficient documentation

## 2020-10-05 LAB — CBC WITH DIFFERENTIAL/PLATELET
Abs Immature Granulocytes: 0.03 10*3/uL (ref 0.00–0.07)
Basophils Absolute: 0 10*3/uL (ref 0.0–0.1)
Basophils Relative: 0 %
Eosinophils Absolute: 0 10*3/uL (ref 0.0–0.5)
Eosinophils Relative: 0 %
HCT: 38.9 % — ABNORMAL LOW (ref 39.0–52.0)
Hemoglobin: 12.3 g/dL — ABNORMAL LOW (ref 13.0–17.0)
Immature Granulocytes: 0 %
Lymphocytes Relative: 21 %
Lymphs Abs: 2.1 10*3/uL (ref 0.7–4.0)
MCH: 27.5 pg (ref 26.0–34.0)
MCHC: 31.6 g/dL (ref 30.0–36.0)
MCV: 87 fL (ref 80.0–100.0)
Monocytes Absolute: 0.8 10*3/uL (ref 0.1–1.0)
Monocytes Relative: 8 %
Neutro Abs: 7 10*3/uL (ref 1.7–7.7)
Neutrophils Relative %: 71 %
Platelets: 247 10*3/uL (ref 150–400)
RBC: 4.47 MIL/uL (ref 4.22–5.81)
RDW: 14.6 % (ref 11.5–15.5)
WBC: 10 10*3/uL (ref 4.0–10.5)
nRBC: 0 % (ref 0.0–0.2)

## 2020-10-05 LAB — COMPREHENSIVE METABOLIC PANEL
ALT: 25 U/L (ref 0–44)
AST: 20 U/L (ref 15–41)
Albumin: 3.7 g/dL (ref 3.5–5.0)
Alkaline Phosphatase: 66 U/L (ref 38–126)
Anion gap: 8 (ref 5–15)
BUN: 28 mg/dL — ABNORMAL HIGH (ref 6–20)
CO2: 26 mmol/L (ref 22–32)
Calcium: 9.5 mg/dL (ref 8.9–10.3)
Chloride: 105 mmol/L (ref 98–111)
Creatinine, Ser: 1.64 mg/dL — ABNORMAL HIGH (ref 0.61–1.24)
GFR, Estimated: 48 mL/min — ABNORMAL LOW (ref >60)
Glucose, Bld: 95 mg/dL (ref 70–99)
Potassium: 5 mmol/L (ref 3.5–5.1)
Sodium: 139 mmol/L (ref 135–145)
Total Bilirubin: 0.4 mg/dL (ref 0.3–1.2)
Total Protein: 7 g/dL (ref 6.5–8.1)

## 2020-10-05 LAB — VALPROIC ACID LEVEL: Valproic Acid Lvl: 31 ug/mL — ABNORMAL LOW (ref 50.0–100.0)

## 2020-10-05 LAB — PHENYTOIN LEVEL, TOTAL: Phenytoin Lvl: <2.5 ug/mL — ABNORMAL LOW (ref 10.0–20.0)

## 2020-10-05 MED ORDER — DIVALPROEX SODIUM 500 MG PO DR TAB
500.0000 mg | DELAYED_RELEASE_TABLET | Freq: Once | ORAL | Status: AC
  Administered 2020-10-05: 500 mg via ORAL
  Filled 2020-10-05: qty 1

## 2020-10-05 MED ORDER — SODIUM CHLORIDE 0.9 % IV SOLN: INTRAVENOUS | Status: DC

## 2020-10-05 NOTE — ED Notes (Signed)
[facility] at pt bedside for transport home °

## 2020-10-05 NOTE — ED Provider Notes (Signed)
[facility]-EMERGENCY DEPT Provider Note   CSN: [id number] Arrival date & time: 10/05/20  1428     History Chief Complaint  Patient presents with  . Seizures    Ronald Roman is a 60 y.o. male.  60 year old male with history of seizure disorder presents after having witnessed seizure prior to arrival.  Patient states that he takes Depakote and possibly Dilantin however review of the old records does not show any history of Dilantin use.  States that he has not been compliant with his Depakote recently.  According to EMS, patient was sitting in a chair and bystanders reported that he had seizure-like activity for approximately 15 minutes.  Patient had seizure activity with EMS for approximately 4 minutes.  Patient postictal after this and CBG was 79.  Patient currently denies any headache.  States he has had no recent illness.        Past Medical History:  Diagnosis Date  . Diabetes mellitus without complication (HCC)   . GERD (gastroesophageal reflux disease)   . Hyperlipidemia   . Hypertension   . Paranoid schizophrenia (HCC)   . Sleep apnea   . Uric acid nephrolithiasis     Patient Active Problem List   Diagnosis Date Noted  . Seizure (HCC) 05/07/2020  . Altered mental status 02/09/2020  . Acute psychosis (HCC)   . Leukocytosis 02/06/2015  . Sleep apnea 02/05/2015  . AKI (acute kidney injury) (HCC) 02/04/2015  . Seizure disorder (HCC) 02/04/2015  . Acute encephalopathy 02/04/2015  . Diabetes mellitus without complication (HCC)   . Hyperlipidemia   . Hypertension   . Paranoid schizophrenia (HCC)     History reviewed. No pertinent surgical history.     No family history on file.  Social History   Tobacco Use  . Smoking status: Current Every Day Smoker    Packs/day: 1.00    Types: Cigarettes  . Smokeless tobacco: Never Used  Substance Use Topics  . Alcohol use: No  . Drug use: No    Home Medications Prior to Admission  medications   Medication Sig Start Date End Date Taking? Authorizing Provider  albuterol (PROVENTIL HFA;VENTOLIN HFA) 108 (90 BASE) MCG/ACT inhaler Inhale 2 puffs into the lungs every 4 (four) hours as needed for wheezing or shortness of breath. Patient not taking: Reported on 05/07/2020 04/21/15   Hayden Rasmussen, NP  amantadine (SYMMETREL) 100 MG capsule Take 1 capsule (100 mg total) by mouth 2 (two) times daily. 04/26/16   Oneta Rack, NP  cloZAPine (CLOZARIL) 100 MG tablet Take 150 mg by mouth daily after breakfast.     [provider]  dapagliflozin propanediol (FARXIGA) 10 MG TABS tablet Take 10 mg by mouth daily after breakfast.    [provider]  divalproex (DEPAKOTE ER) 500 MG 24 hr tablet Take 2 tablets (1,000 mg total) by mouth at bedtime. 05/08/20   Sabino Dick, DO  divalproex (DEPAKOTE ER) 500 MG 24 hr tablet Take 1 tablet (500 mg total) by mouth every morning. 05/09/20   Sabino Dick, DO  hydrOXYzine (ATARAX/VISTARIL) 25 MG tablet Take 25 mg by mouth 4 (four) times daily. 10/17/19   [provider]  insulin glargine (LANTUS) 100 UNIT/ML injection Inject 0.2 mLs (20 Units total) into the skin at bedtime. Patient taking differently: Inject 25 Units into the skin at bedtime. 04/26/16   Oneta Rack, NP  lisinopril (ZESTRIL) 20 MG tablet TAKE 1/2 TABLET (10 MG TOTAL) BY MOUTH DAILY. 05/08/20 05/08/21  Sabino Dick, DO  metFORMIN (GLUCOPHAGE) 1000 MG tablet Take 1,000 mg by mouth 2 (two) times daily with a meal.    [provider]  QUEtiapine (SEROQUEL) 100 MG tablet Take 100 mg by mouth in the morning and at bedtime.    [provider]  QUEtiapine (SEROQUEL) 50 MG tablet Take 50 mg by mouth daily at 12 noon.    [provider]  traZODone (DESYREL) 50 MG tablet Take 50-100 mg by mouth at bedtime as needed for sleep.    [provider]  zolpidem (AMBIEN) 5 MG tablet Take 5 mg by mouth at bedtime.     [provider]    Allergies    Cogentin [benztropine], Penicillins, and Prolixin [fluphenazine]  Review of Systems   Review of Systems  Unable to perform ROS: Mental status change    Physical Exam Updated Vital Signs BP 103/80 (BP Location: Right Arm)   Pulse (!) 104   Temp 98.4 F (36.9 C) (Oral)   Resp 17   Ht 1.753 m (5\' 9" )   Wt 88 kg   SpO2 97%   BMI 28.65 kg/m   Physical Exam Vitals and nursing note reviewed.  Constitutional:      General: He is not in acute distress.    Appearance: Normal appearance. He is well-developed. He is not toxic-appearing.  HENT:     Head: Normocephalic and atraumatic.  Eyes:     General: Lids are normal.     Conjunctiva/sclera: Conjunctivae normal.     Pupils: Pupils are equal, round, and reactive to light.  Neck:     Thyroid: No thyroid mass.     Trachea: No tracheal deviation.  Cardiovascular:     Rate and Rhythm: Normal rate and regular rhythm.     Heart sounds: Normal heart sounds. No murmur heard. No gallop.   Pulmonary:     Effort: Pulmonary effort is normal. No respiratory distress.     Breath sounds: Normal breath sounds. No stridor. No decreased breath sounds, wheezing, rhonchi or rales.  Abdominal:     General: Bowel sounds are normal. There is no distension.     Palpations: Abdomen is soft.     Tenderness: There is no abdominal tenderness. There is no rebound.  Musculoskeletal:        General: No tenderness. Normal range of motion.     Cervical back: Normal range of motion and neck supple.  Skin:    General: Skin is warm and dry.     Findings: No abrasion or rash.  Neurological:     General: No focal deficit present.     Mental Status: He is alert and oriented to person, place, and time.     GCS: GCS eye subscore is 4. GCS verbal subscore is 5. GCS motor subscore is 6.     Cranial Nerves: No cranial nerve deficit.     Sensory: No sensory deficit.     Motor: Motor function is intact.  Psychiatric:         Attention and Perception: He is inattentive.        Mood and Affect: Affect is flat.        Speech: Speech is delayed.        Behavior: Behavior is slowed. Behavior is cooperative.     ED Results / Procedures / Treatments   Labs (all labs ordered are listed, but only abnormal results are displayed) Labs Reviewed  CBC WITH DIFFERENTIAL/PLATELET  COMPREHENSIVE METABOLIC PANEL  VALPROIC ACID LEVEL  PHENYTOIN LEVEL, TOTAL    EKG EKG Interpretation  Date/Time:  Sunday Oct 05 2020 15:19:39 EDT Ventricular Rate:  94 PR Interval:  164 QRS Duration: 82 QT Interval:  326 QTC Calculation: 408 R Axis:   67 Text Interpretation: Sinus rhythm Confirmed by Lorre Nick ([id number]) on 10/05/2020 3:23:26 PM   Radiology No results found.  Procedures Procedures   Medications Ordered in ED Medications  0.9 %  sodium chloride infusion (has no administration in time range)    ED Course  I have reviewed the triage vital signs and the nursing notes.  Pertinent labs & imaging results that were available during my care of the patient were reviewed by me and considered in my medical decision making (see chart for details).    MDM Rules/Calculators/A&P                          Patient is subtherapeutic on his Depakote and was given oral medication.  His Dilantin level was low but I find no history of him taking Dilantin.  We will not load this at this point.  Head CT without acute findings.  Will discharge home Final Clinical Impression(s) / ED Diagnoses Final diagnoses:  None    Rx / DC Orders ED Discharge Orders    None       Lorre Nick, MD 10/05/20 1739

## 2020-10-05 NOTE — ED Triage Notes (Signed)
BIB EMS from home, patient was sitting in a chair outside and bystanders reported seizure-like activity for about 15 min, was unable to answer questions w/ EMS and had about 4 min of seizure activity w/ EMS. Presents postictal, unable to answer questions. Did not fall or hit his head. 12 lead unremarkable w/ EMS.  CBG was 79

## 2020-10-05 NOTE — ED Notes (Signed)
Spoke with Ronald Roman at pts group home, she is expecting pt via [facility]

## 2020-10-05 NOTE — ED Notes (Signed)
This nurse has called both pt contacts pertaining to pt discharge but both numbers are incorrect on file.

## 2020-10-05 NOTE — ED Notes (Signed)
Landline to pts group home is not working. Authorized contact listed states wrong number called. This nurse googled owner of group home Trenton Gammon who states that she no longer owns home that pt is living at. This nurse was informed by Mrs. Sherron Monday that she no longer owns the home and she provided the correct contact information for current owner of home. This nurse was able to speak with owner and staff person that will be receiving pt from [facility].

## 2020-10-09 ENCOUNTER — Emergency Department (HOSPITAL_COMMUNITY)
Admission: EM | Admit: 2020-10-09 | Discharge: 2020-10-10 | Disposition: A | Discharge status: Home / Self Care | Payer: Medicaid Other | Attending: Emergency Medicine | Admitting: Emergency Medicine

## 2020-10-09 ENCOUNTER — Encounter (HOSPITAL_COMMUNITY): Payer: Self-pay

## 2020-10-09 DIAGNOSIS — Z79899 Other long term (current) drug therapy: Secondary | ICD-10-CM | POA: Insufficient documentation

## 2020-10-09 DIAGNOSIS — F919 Conduct disorder, unspecified: Secondary | ICD-10-CM

## 2020-10-09 DIAGNOSIS — Z7984 Long term (current) use of oral hypoglycemic drugs: Secondary | ICD-10-CM | POA: Diagnosis not present

## 2020-10-09 DIAGNOSIS — Z046 Encounter for general psychiatric examination, requested by authority: Secondary | ICD-10-CM | POA: Diagnosis present

## 2020-10-09 DIAGNOSIS — Z794 Long term (current) use of insulin: Secondary | ICD-10-CM | POA: Diagnosis not present

## 2020-10-09 DIAGNOSIS — E119 Type 2 diabetes mellitus without complications: Secondary | ICD-10-CM | POA: Diagnosis not present

## 2020-10-09 DIAGNOSIS — F209 Schizophrenia, unspecified: Secondary | ICD-10-CM | POA: Insufficient documentation

## 2020-10-09 DIAGNOSIS — Z7901 Long term (current) use of anticoagulants: Secondary | ICD-10-CM | POA: Diagnosis not present

## 2020-10-09 DIAGNOSIS — F17210 Nicotine dependence, cigarettes, uncomplicated: Secondary | ICD-10-CM | POA: Insufficient documentation

## 2020-10-09 DIAGNOSIS — R4689 Other symptoms and signs involving appearance and behavior: Secondary | ICD-10-CM | POA: Insufficient documentation

## 2020-10-09 DIAGNOSIS — I10 Essential (primary) hypertension: Secondary | ICD-10-CM | POA: Diagnosis not present

## 2020-10-09 LAB — ACETAMINOPHEN LEVEL: Acetaminophen (Tylenol), Serum: <10 ug/mL — ABNORMAL LOW (ref 10–30)

## 2020-10-09 LAB — CBC
HCT: 40.2 % (ref 39.0–52.0)
Hemoglobin: 12.8 g/dL — ABNORMAL LOW (ref 13.0–17.0)
MCH: 27.5 pg (ref 26.0–34.0)
MCHC: 31.8 g/dL (ref 30.0–36.0)
MCV: 86.5 fL (ref 80.0–100.0)
Platelets: 210 10*3/uL (ref 150–400)
RBC: 4.65 MIL/uL (ref 4.22–5.81)
RDW: 14.6 % (ref 11.5–15.5)
WBC: 9.6 10*3/uL (ref 4.0–10.5)
nRBC: 0 % (ref 0.0–0.2)

## 2020-10-09 LAB — CBG MONITORING, ED: Glucose-Capillary: 111 mg/dL — ABNORMAL HIGH (ref 70–99)

## 2020-10-09 LAB — RAPID URINE DRUG SCREEN, HOSP PERFORMED
Amphetamines: NOT DETECTED
Barbiturates: NOT DETECTED
Benzodiazepines: NOT DETECTED
Cocaine: NOT DETECTED
Opiates: NOT DETECTED
Tetrahydrocannabinol: NOT DETECTED

## 2020-10-09 LAB — COMPREHENSIVE METABOLIC PANEL
ALT: 17 U/L (ref 0–44)
AST: 24 U/L (ref 15–41)
Albumin: 3.6 g/dL (ref 3.5–5.0)
Alkaline Phosphatase: 62 U/L (ref 38–126)
Anion gap: 8 (ref 5–15)
BUN: 29 mg/dL — ABNORMAL HIGH (ref 6–20)
CO2: 22 mmol/L (ref 22–32)
Calcium: 9.5 mg/dL (ref 8.9–10.3)
Chloride: 108 mmol/L (ref 98–111)
Creatinine, Ser: 1.65 mg/dL — ABNORMAL HIGH (ref 0.61–1.24)
GFR, Estimated: 48 mL/min — ABNORMAL LOW (ref >60)
Glucose, Bld: 67 mg/dL — ABNORMAL LOW (ref 70–99)
Potassium: 4.6 mmol/L (ref 3.5–5.1)
Sodium: 138 mmol/L (ref 135–145)
Total Bilirubin: 0.5 mg/dL (ref 0.3–1.2)
Total Protein: 7 g/dL (ref 6.5–8.1)

## 2020-10-09 LAB — SALICYLATE LEVEL: Salicylate Lvl: <7 mg/dL — ABNORMAL LOW (ref 7.0–30.0)

## 2020-10-09 LAB — ETHANOL: Alcohol, Ethyl (B): <10 mg/dL (ref <10)

## 2020-10-09 MED ORDER — LORAZEPAM 1 MG PO TABS
1.0000 mg | ORAL_TABLET | Freq: Once | ORAL | Status: AC
  Administered 2020-10-09: 1 mg via ORAL
  Filled 2020-10-09: qty 1

## 2020-10-09 NOTE — ED Notes (Addendum)
Pt arrived with many bags of belongings sent with him from group home, belongings placed into 4 white pt belongings bags and placed in patient belongings cabinet 9-12 due to lack of room in cabinets near 16-18. Pt willingly changed into red scrubs. Charge RN aware pt will need sitter.

## 2020-10-09 NOTE — ED Notes (Signed)
Pt to room 28. Pt alert, calm, cooperative, no s/s of distress. Pt oriented to unit and room.  Pt resting comfortably.

## 2020-10-09 NOTE — ED Notes (Signed)
Pt has 4 belongings bags. 3 are placed in locker #28. The fourth bag is placed in locker #34 since it all wont fit into one locker.
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
## 2020-10-09 NOTE — ED Provider Notes (Signed)
[facility]-EMERGENC[facility] DEPT Provider Note   CSN: [id number] Arrival date & time: 10/09/20  1138     History Chief Complaint  Patient presents with  . IVC    Ronald Roman is a 60 y.o. male.  60yo M w/ PMH below including schizophrenia, T2DM, OSA, GERD who p/w IVC.  Patient was sent from his group home for behavioral problems for which they completed IVC.  On IVC papers, group home states that the patient has been disturbing other residents, starting fights and wandering into patient rooms.  He does have auditory and visual hallucinations consistent with his known psychiatric diagnosis.  Patient tells me that he feels okay and denies any complaints.  LEVEL 5 CAVEAT DUE TO PSYCHIATRIC ILLNESS  The history is provided by the EMS personnel and the nursing home.       Past Medical History:  Diagnosis Date  . Diabetes mellitus without complication (HCC)   . GERD (gastroesophageal reflux disease)   . Hyperlipidemia   . Hypertension   . Paranoid schizophrenia (HCC)   . Sleep apnea   . Uric acid nephrolithiasis     Patient Active Problem List   Diagnosis Date Noted  . Seizure (HCC) 05/07/2020  . Altered mental status 02/09/2020  . Acute psychosis (HCC)   . Leukocytosis 02/06/2015  . Sleep apnea 02/05/2015  . AKI (acute kidney injury) (HCC) 02/04/2015  . Seizure disorder (HCC) 02/04/2015  . Acute encephalopathy 02/04/2015  . Diabetes mellitus without complication (HCC)   . Hyperlipidemia   . Hypertension   . Paranoid schizophrenia (HCC)     History reviewed. No pertinent surgical history.     History reviewed. No pertinent family history.  Social History   Tobacco Use  . Smoking status: Current Every Day Smoker    Packs/day: 1.00    Types: Cigarettes  . Smokeless tobacco: Never Used  Substance Use Topics  . Alcohol use: No  . Drug use: No    Home Medications Prior to Admission medications   Medication Sig Start Date End Date Taking?  Authorizing Provider  albuterol (PROVENTIL HFA;VENTOLIN HFA) 108 (90 BASE) MCG/ACT inhaler Inhale 2 puffs into the lungs every 4 (four) hours as needed for wheezing or shortness of breath. Patient not taking: Reported on 05/07/2020 04/21/15   Hayden Rasmussen, NP  amantadine (SYMMETREL) 100 MG capsule Take 1 capsule (100 mg total) by mouth 2 (two) times daily. 04/26/16   Oneta Rack, NP  cloZAPine (CLOZARIL) 100 MG tablet Take 150 mg by mouth daily after breakfast.     [provider]  D2000 ULTRA STRENGTH 50 MCG (2000 UT) CAPS Take 2,000 Units by mouth daily. 09/12/20   [provider]  dapagliflozin propanediol (FARXIGA) 10 MG TABS tablet Take 10 mg by mouth daily after breakfast.    [provider]  DEPAKOTE 250 MG DR tablet Take 250 mg by mouth at bedtime. 09/23/20   [provider]  divalproex (DEPAKOTE ER) 500 MG 24 hr tablet Take 2 tablets (1,000 mg total) by mouth at bedtime. 05/08/20   Sabino Dick, DO  divalproex (DEPAKOTE ER) 500 MG 24 hr tablet Take 1 tablet (500 mg total) by mouth every morning. 05/09/20   Espinoza, Myrlene Broker, DO  ELIQUIS 2.5 MG TABS tablet Take 2.5 mg by mouth 2 (two) times daily. 09/12/20   [provider]  hydrOXYzine (ATARAX/VISTARIL) 25 MG tablet Take 25 mg by mouth 4 (four) times daily. 10/17/19   [provider]  insulin  glargine (LANTUS) 100 UNIT/ML injection Inject 0.2 mLs (20 Units total) into the skin at bedtime. Patient taking differently: Inject 25 Units into the skin at bedtime. 04/26/16   Oneta Rack, NP  lisinopril (ZESTRIL) 20 MG tablet TAKE 1/2 TABLET (10 MG TOTAL) BY MOUTH DAILY. 05/08/20 05/08/21  Sabino Dick, DO  melatonin 3 MG TABS tablet Take 6 mg by mouth at bedtime. 09/12/20   [provider]  metFORMIN (GLUCOPHAGE) 1000 MG tablet Take 1,000 mg by mouth 2 (two) times daily with a meal.    [provider]  QUEtiapine (SEROQUEL) 100 MG tablet Take 100 mg by mouth in  the morning and at bedtime.    [provider]  QUEtiapine (SEROQUEL) 50 MG tablet Take 50 mg by mouth daily at 12 noon.    [provider]  traZODone (DESYREL) 50 MG tablet Take 50-100 mg by mouth at bedtime as needed for sleep.    [provider]  vitamin C (ASCORBIC ACID) 500 MG tablet Take 500 mg by mouth 2 (two) times daily. 09/12/20   [provider]  ZINC-220 220 (50 Zn) MG capsule Take 220 mg by mouth daily. 09/12/20   [provider]  zolpidem (AMBIEN) 5 MG tablet Take 5 mg by mouth at bedtime.    [provider]    Allergies    Cogentin [benztropine], Penicillins, and Prolixin [fluphenazine]  Review of Systems   Review of Systems  Unable to perform ROS: Psychiatric disorder    Physical Exam Updated Vital Signs BP 109/76 (BP Location: Right Arm)   Pulse 68   Temp 98.7 F (37.1 C) (Oral)   Resp 20   Ht 5\' 9"  (1.753 m)   Wt 88 kg   SpO2 97%   BMI 28.65 kg/m   Physical Exam Vitals and nursing note reviewed.  Constitutional:      General: He is not in acute distress.    Appearance: Normal appearance.  HENT:     Head: Normocephalic and atraumatic.  Eyes:     Conjunctiva/sclera: Conjunctivae normal.  Cardiovascular:     Rate and Rhythm: Normal rate and regular rhythm.     Heart sounds: Normal heart sounds. No murmur heard.   Pulmonary:     Effort: Pulmonary effort is normal.     Breath sounds: Normal breath sounds.  Abdominal:     General: Abdomen is flat. Bowel sounds are normal. There is no distension.     Palpations: Abdomen is soft.     Tenderness: There is no abdominal tenderness.  Musculoskeletal:     Right lower leg: No edema.     Left lower leg: No edema.  Skin:    General: Skin is warm and dry.  Neurological:     Mental Status: He is alert.     Comments: Alert, moving all 4 extremities equally; tardive dyskinesia  Psychiatric:        Cognition and Memory: Cognition is impaired.     Comments:  Calm, cooperative     ED Results / Procedures / Treatments   Labs (all labs ordered are listed, but only abnormal results are displayed) Labs Reviewed  COMPREHENSIVE METABOLIC PANEL - Abnormal; Notable for the following components:      Result Value   Glucose, Bld 67 (*)    BUN 29 (*)    Creatinine, Ser 1.65 (*)    GFR, Estimated 48 (*)    All other components within normal limits  SALICYLATE LEVEL - Abnormal;  Notable for the following components:   Salicylate Lvl <7.0 (*)    All other components within normal limits  ACETAMINOPHEN LEVEL - Abnormal; Notable for the following components:   Acetaminophen (Tylenol), Serum <10 (*)    All other components within normal limits  CBC - Abnormal; Notable for the following components:   Hemoglobin 12.8 (*)    All other components within normal limits  CBG MONITORING, ED - Abnormal; Notable for the following components:   Glucose-Capillary 111 (*)    All other components within normal limits  ETHANOL  RAPID URINE DRUG SCREEN, HOSP PERFORMED    EKG None  Radiology No results found.  Procedures Procedures   Medications Ordered in ED Medications  LORazepam (ATIVAN) tablet 1 mg (1 mg Oral Given 10/09/20 1249)    ED Course  I have reviewed the triage vital signs and the nursing notes.  Pertinent labs that were available during my care of the patient were reviewed by me and considered in my medical decision making (see chart for details).    MDM Rules/Calculators/A&P                          Pt alert and calm on exam.  Screening lab work notable only for creatinine 1.65, BG 67. Repeat BG after PO is 111. Labs otherwise unremarkable. I have consulted TTS for evaluation. Dispo pending psychiatry team recommendations. Final Clinical Impression(s) / ED Diagnoses Final diagnoses:  None    Rx / DC Orders ED Discharge Orders    None       Nabilah Davoli, Ambrose Finland, MD 10/09/20 1920

## 2020-10-09 NOTE — ED Notes (Signed)
Cathleen Corti assigned as Comptroller.

## 2020-10-09 NOTE — ED Notes (Addendum)
Pt ambulated to nurses desk and repeated 'I am having a seizure' 3x, walked back to stretcher and resting comfortably. Pt unable to verbalize any other complaints. No other symptoms noted, notified MD Trifan.

## 2020-10-10 MED ORDER — HYDROXYZINE HCL 25 MG PO TABS: 25.0000 mg | ORAL_TABLET | Freq: Four times a day (QID) | ORAL | Status: DC

## 2020-10-10 MED ORDER — METFORMIN HCL 500 MG PO TABS: 1000.0000 mg | ORAL_TABLET | Freq: Two times a day (BID) | ORAL | Status: DC

## 2020-10-10 MED ORDER — QUETIAPINE FUMARATE 100 MG PO TABS: 100.0000 mg | ORAL_TABLET | Freq: Two times a day (BID) | ORAL | Status: DC

## 2020-10-10 MED ORDER — DIVALPROEX SODIUM 250 MG PO DR TAB: 250.0000 mg | DELAYED_RELEASE_TABLET | Freq: Every day | ORAL | Status: DC

## 2020-10-10 MED ORDER — AMANTADINE HCL 100 MG PO CAPS
100.0000 mg | ORAL_CAPSULE | Freq: Two times a day (BID) | ORAL | Status: DC
Start: 2020-10-10 — End: 2020-10-10

## 2020-10-10 MED ORDER — CLOZAPINE 25 MG PO TABS: 150.0000 mg | ORAL_TABLET | Freq: Every day | ORAL | Status: DC

## 2020-10-10 MED ORDER — TRAZODONE HCL 50 MG PO TABS: 25.0000 mg | ORAL_TABLET | Freq: Every day | ORAL | Status: DC

## 2020-10-10 MED ORDER — LISINOPRIL 20 MG PO TABS: 20.0000 mg | ORAL_TABLET | Freq: Every day | ORAL | Status: DC

## 2020-10-10 MED ORDER — DAPAGLIFLOZIN PROPANEDIOL 10 MG PO TABS: 10.0000 mg | ORAL_TABLET | Freq: Every day | ORAL | Status: DC

## 2020-10-10 NOTE — Discharge Instructions (Signed)
For your behavioral health needs you are advised to follow up with your regular outpatient psychiatrist.  If you are not able to do so at this time, contact [facility]:       [facility]      [address]      [place], [name] [address]      [phone]     They offer psychiatry/medication management and therapy.  New patients are seen in their walk-in clinic.  Walk-in hours are Monday - Thursday from 8:00 am - 11:00 am for psychiatry, and Friday from 1:00 pm - 4:00 pm for therapy.  Walk-in patients are seen on a first come, first served basis, so try to arrive as early as possible for the best chance of being seen the same day.

## 2020-10-10 NOTE — BH Assessment (Signed)
[facility] Assessment Progress Note  Per Maxie Barb, NP, this pt does not require psychiatric hospitalization at this time.  Pt presents under IVC initiated by pt's facility administrator and initially upheld by EDP Arby Barrette, MD, but which she later rescinded.  Pt is psychiatrically cleared.  Discharge instructions advise pt to continue treatment with his current outpatient provider, but offer information for [facility] as a backup plan.  Dr Donnald Garre and pt's nurse, Waynetta Sandy, have been notified.  Doylene Canning, MA Triage Specialist [phone]

## 2020-10-10 NOTE — ED Provider Notes (Signed)
Emergency Medicine Observation Re-evaluation Note  Ronald Roman is a 60 y.o. male, seen on rounds today.  Pt initially presented to the ED for complaints of IVC Currently, the patient is watching television.  He is up and ambulatory.  He is walked into the bathroom.  Patient has no complaints.  He reports he is eaten this morning.  He feels well at this time.  Physical Exam  BP 122/79 (BP Location: Right Arm)   Pulse (!) 102   Temp 98.2 F (36.8 C) (Oral)   Resp 16   Ht 5\' 9"  (1.753 m)   Wt 88 kg   SpO2 98%   BMI 28.65 kg/m  Physical Exam Patient is alert and ambulatory without difficulty.  Gait is coordinated.  No respiratory distress.  Mental status is alert.  ED Course / MDM  EKG:   I have reviewed the labs performed to date as well as medications administered while in observation.  Recent changes in the last 24 hours include patient being psychiatrically cleared with intention to return to group home.  Plan  Current plan is for return to group home. Patient is under full IVC at this time.  First exam performed for patient's IVC.  At this time patient is being cleared by psych for anticipated return to group home.  Will rescind IVC when appropriate arrangements are in place.   , MD 10/10/20 1139

## 2020-10-10 NOTE — Consult Note (Signed)
Telepsych Consultation   Reason for Consult:  Laurence Spates, MD Referring Physician:  TTS consult Location of Patient: [phone] Location of Provider: Behavioral Health TTS Department  Patient Identification: Ronald Roman MRN:  [id number] Principal Diagnosis: <principal problem not specified> Diagnosis:  Active Problems:   Schizophrenia (HCC)  Total Time spent with patient: 30 minutes  Subjective:   Ronald Roman is a 60 y.o. male patient admitted via IVC from group home for "disturbing other residents, starting fights, and auditory and visual hallucinations".   On assessment patient presents alert, withdrawn and rocking while sitting on side of the bed. Patient responds to commands; responds to questions and tracks voice. Voice volume low, hard to understand at times. History of TD. Patient denies any thoughts of wanting to harm himself or others and states he is not actively hearing any voices or seeing things others may not see. He is calm and does not appear to be responding to any external/internal stimuli at this time.   Collateral:  Joslyn Hy (group home administrator) [phone] Provider discussed patient current presentation; owner agrees patient is able to return to placement. Provides number to staff for medication list and transportation arrangements  Alvis (group home staff) [phone] Provided patient's updated medication list. States patient has not received Clozaril for a few days due to "running out". Unable to provide information of patient's current prescriber.   Due to patient's elevated labs; Metformin, Lisinopril, Faringxa on hold.   HPI:   Ronald Roman is a 60 year old male with a past history of schizophrenia, T2DM, OSA, GERD who presented to [facility] from group home via IVC for reportedly disturbing other residents, starting fights, and wandering into patient's rooms. Patient was recently seen in ED (05/15) for seizure activity related to Depakote  non-compliance; current Depakote level 31.   Past Psychiatric History:   -schizoaffective disorder  -tardive dyskinesia  Risk to Self:  no Risk to Others:  no Prior Inpatient Therapy:   yes Prior Outpatient Therapy:  yes  Past Medical History:  Past Medical History:  Diagnosis Date  . Diabetes mellitus without complication (HCC)   . GERD (gastroesophageal reflux disease)   . Hyperlipidemia   . Hypertension   . Paranoid schizophrenia (HCC)   . Sleep apnea   . Uric acid nephrolithiasis    History reviewed. No pertinent surgical history. Family History: History reviewed. No pertinent family history. Family Psychiatric  History: not noted Social History:  Social History   Substance and Sexual Activity  Alcohol Use No     Social History   Substance and Sexual Activity  Drug Use No    Social History   Socioeconomic History  . Marital status: Single    Spouse name: Not on file  . Number of children: Not on file  . Years of education: Not on file  . Highest education level: Not on file  Occupational History  . Not on file  Tobacco Use  . Smoking status: Current Every Day Smoker    Packs/day: 1.00    Types: Cigarettes  . Smokeless tobacco: Never Used  Substance and Sexual Activity  . Alcohol use: No  . Drug use: No  . Sexual activity: Not Currently  Other Topics Concern  . Not on file  Social History Narrative  . Not on file   Social Determinants of Health   Financial Resource Strain: Not on file  Food Insecurity: Not on file  Transportation Needs: Not on file  Physical Activity:  Not on file  Stress: Not on file  Social Connections: Not on file   Additional Social History:   Allergies:   Allergies  Allergen Reactions  . Cogentin [Benztropine] Other (See Comments)    Per MAR   . Penicillins Other (See Comments)    Per MAR - unable to verify patients PCN reaction   . Prolixin [Fluphenazine] Other (See Comments)    Per MAR    Labs:  Results for  orders placed or performed during the hospital encounter of 10/09/20 (from the past 48 hour(s))  Comprehensive metabolic panel     Status: Abnormal   Collection Time: 10/09/20 11:38 AM  Result Value Ref Range   Sodium 138 135 - 145 mmol/L   Potassium 4.6 3.5 - 5.1 mmol/L   Chloride 108 98 - 111 mmol/L   CO2 22 22 - 32 mmol/L   Glucose, Bld 67 (L) 70 - 99 mg/dL    Comment: Glucose reference range applies only to samples taken after fasting for at least 8 hours.   BUN 29 (H) 6 - 20 mg/dL   Creatinine, Ser 1.61 (H) 0.61 - 1.24 mg/dL   Calcium 9.5 8.9 - 09.6 mg/dL   Total Protein 7.0 6.5 - 8.1 g/dL   Albumin 3.6 3.5 - 5.0 g/dL   AST 24 15 - 41 U/L   ALT 17 0 - 44 U/L   Alkaline Phosphatase 62 38 - 126 U/L   Total Bilirubin 0.5 0.3 - 1.2 mg/dL   GFR, Estimated 48 (L) >60 mL/min    Comment: (NOTE) Calculated using the CKD-EPI Creatinine Equation (2021)    Anion gap 8 5 - 15    Comment: Performed at [facility], [address] [place], [name] [address]  Ethanol     Status: None   Collection Time: 10/09/20 11:38 AM  Result Value Ref Range   Alcohol, Ethyl (B) <10 <10 mg/dL    Comment: (NOTE) Lowest detectable limit for serum alcohol is 10 mg/dL.  For medical purposes only. Performed at [facility], [address] [place], [name] [address]   Salicylate level     Status: Abnormal   Collection Time: 10/09/20 11:38 AM  Result Value Ref Range   Salicylate Lvl <7.0 (L) 7.0 - 30.0 mg/dL    Comment: Performed at [facility], [address] [place], [name] [address]  Acetaminophen level     Status: Abnormal   Collection Time: 10/09/20 11:38 AM  Result Value Ref Range   Acetaminophen (Tylenol), Serum <10 (L) 10 - 30 ug/mL    Comment: (NOTE) Therapeutic concentrations vary significantly. A range of 10-30 ug/mL  may be an effective concentration for many patients. However, some  are best treated at concentrations outside of  this range. Acetaminophen concentrations >150 ug/mL at 4 hours after ingestion  and >50 ug/mL at 12 hours after ingestion are often associated with  toxic reactions.  Performed at [facility], [address], [place], [name] [address]   cbc     Status: Abnormal   Collection Time: 10/09/20 11:38 AM  Result Value Ref Range   WBC 9.6 4.0 - 10.5 K/uL   RBC 4.65 4.22 - 5.81 MIL/uL   Hemoglobin 12.8 (L) 13.0 - 17.0 g/dL   HCT 30.8 65.7 - 84.6 %   MCV 86.5 80.0 - 100.0 fL   MCH 27.5 26.0 - 34.0 pg   MCHC 31.8 30.0 - 36.0 g/dL   RDW 14.6  11.5 - 15.5 %   Platelets 210 150 - 400 K/uL   nRBC 0.0 0.0 - 0.2 %    Comment: Performed at [facility], [address] [place], [name] [address]  Rapid urine drug screen (hospital performed)     Status: None   Collection Time: 10/09/20 12:32 PM  Result Value Ref Range   Opiates NONE DETECTED NONE DETECTED   Cocaine NONE DETECTED NONE DETECTED   Benzodiazepines NONE DETECTED NONE DETECTED   Amphetamines NONE DETECTED NONE DETECTED   Tetrahydrocannabinol NONE DETECTED NONE DETECTED   Barbiturates NONE DETECTED NONE DETECTED    Comment: (NOTE) DRUG SCREEN FOR MEDICAL PURPOSES ONLY.  IF CONFIRMATION IS NEEDED FOR ANY PURPOSE, NOTIFY LAB WITHIN 5 DAYS.  LOWEST DETECTABLE LIMITS FOR URINE DRUG SCREEN Drug Class                     Cutoff (ng/mL) Amphetamine and metabolites    1000 Barbiturate and metabolites    200 Benzodiazepine                 200 Tricyclics and metabolites     300 Opiates and metabolites        300 Cocaine and metabolites        300 THC                            50 Performed at [facility], [address], [place], [name] [address]   CBG monitoring, ED     Status: Abnormal   Collection Time: 10/09/20  7:11 PM  Result Value Ref Range   Glucose-Capillary 111 (H) 70 - 99 mg/dL    Comment: Glucose reference range applies only to samples taken after fasting for  at least 8 hours.   Comment 1 Notify RN    Comment 2 Document in Chart    Medications:  No current facility-administered medications for this encounter.   Current Outpatient Medications  Medication Sig Dispense Refill  . albuterol (PROVENTIL HFA;VENTOLIN HFA) 108 (90 BASE) MCG/ACT inhaler Inhale 2 puffs into the lungs every 4 (four) hours as needed for wheezing or shortness of breath. (Patient not taking: Reported on 05/07/2020) 1 Inhaler 0  . amantadine (SYMMETREL) 100 MG capsule Take 1 capsule (100 mg total) by mouth 2 (two) times daily. 60 capsule 0  . cloZAPine (CLOZARIL) 100 MG tablet Take 150 mg by mouth daily after breakfast.     . D2000 ULTRA STRENGTH 50 MCG (2000 UT) CAPS Take 2,000 Units by mouth daily.    . dapagliflozin propanediol (FARXIGA) 10 MG TABS tablet Take 10 mg by mouth daily after breakfast.    . DEPAKOTE 250 MG DR tablet Take 250 mg by mouth at bedtime.    . divalproex (DEPAKOTE ER) 500 MG 24 hr tablet Take 2 tablets (1,000 mg total) by mouth at bedtime. 30 tablet 1  . divalproex (DEPAKOTE ER) 500 MG 24 hr tablet Take 1 tablet (500 mg total) by mouth every morning. 30 tablet 1  . ELIQUIS 2.5 MG TABS tablet Take 2.5 mg by mouth 2 (two) times daily.    . hydrOXYzine (ATARAX/VISTARIL) 25 MG tablet Take 25 mg by mouth 4 (four) times daily.    . insulin glargine (LANTUS) 100 UNIT/ML injection Inject 0.2 mLs (20 Units total) into the skin at bedtime. (Patient taking differently: Inject 25 Units into the skin at bedtime.) 10 mL 0  .  lisinopril (ZESTRIL) 20 MG tablet TAKE 1/2 TABLET (10 MG TOTAL) BY MOUTH DAILY. 30 tablet 1  . melatonin 3 MG TABS tablet Take 6 mg by mouth at bedtime.    . metFORMIN (GLUCOPHAGE) 1000 MG tablet Take 1,000 mg by mouth 2 (two) times daily with a meal.    . QUEtiapine (SEROQUEL) 100 MG tablet Take 100 mg by mouth in the morning and at bedtime.    Marland Kitchen. QUEtiapine (SEROQUEL) 50 MG tablet Take 50 mg by mouth daily at 12 noon.    . traZODone (DESYREL) 50  MG tablet Take 50-100 mg by mouth at bedtime as needed for sleep.    . vitamin C (ASCORBIC ACID) 500 MG tablet Take 500 mg by mouth 2 (two) times daily.    Marland Kitchen. ZINC-220 220 (50 Zn) MG capsule Take 220 mg by mouth daily.    Marland Kitchen. zolpidem (AMBIEN) 5 MG tablet Take 5 mg by mouth at bedtime.     Musculoskeletal: Strength & Muscle Tone: within normal limits Gait & Station: normal Patient leans: N/A  Psychiatric Specialty Exam: Physical Exam Vitals and nursing note reviewed.  Psychiatric:        Attention and Perception: He is inattentive.        Mood and Affect: Affect is flat.        Speech: He is noncommunicative.        Behavior: Behavior is cooperative.        Cognition and Memory: Cognition is impaired.        Judgment: Judgment is impulsive.     Review of Systems  Psychiatric/Behavioral: Positive for decreased concentration.  All other systems reviewed and are negative.   Blood pressure 122/79, pulse (!) 102, temperature 98.2 F (36.8 C), temperature source Oral, resp. rate 16, height 5\' 9"  (1.753 m), weight 88 kg, SpO2 98 %.Body mass index is 28.65 kg/m.  General Appearance: Casual  Eye Contact:  inconsistent  Speech:  Blocked  Volume:  Normal  Mood:  Euthymic  Affect:  Non-Congruent  Thought Process:  Linear  Orientation:  NA  Thought Content:  NA  Suicidal Thoughts:  No  Homicidal Thoughts:  No  Memory:  Immediate;   Poor Recent;   Poor  Judgement:  Impaired  Insight:  Shallow  Psychomotor Activity:  TD  Concentration:  Concentration: Poor and Attention Span: Poor  Recall:  Poor  Fund of Knowledge:  Poor  Language:  Poor  Akathisia:  NA  Handed:    AIMS (if indicated):     Assets:  Financial Resources/Insurance Housing Physical Health Resilience  ADL's:  Intact  Cognition:  WNL  Sleep:      Treatment Plan Summary: Plan discharge patient to group home.   -Medication list received from group home staff (Alvis);    medications restarted   -set to retrieve  patient from ED@ 12pm  -Spoke to group home administrator; patient is able to return  Disposition: No evidence of imminent risk to self or others at present.   Patient does not meet criteria for psychiatric inpatient admission. Discussed crisis plan, support from social network, calling 911, coming to the Emergency Department, and calling Suicide Hotline.  This service was provided via telemedicine using a 2-way, interactive audio and video technology.  Names of all persons participating in this telemedicine service and their role in this encounter. Name: Maxie BarbBrooke Leevy-Johnson Role: PMHNP  Name: Nelly Routrchana Kumar Role: Attending MD  Name: Seward GraterBarry Revelle Role: patient  Name: Joslyn HyAnith Kurian Role: group  home administrator    Loletta Parish, NP 10/10/2020 9:42 AM

## 2020-10-10 NOTE — ED Notes (Signed)
Pt DC d off unit to home per provider. Pt alert, cooperative, no s/s of distress. DC information given to pt for caregivers. Belongings given to pt .pt ambulatory  Pt off unit in w/c, escorted by NT. Pt transported by caregiver.

## 2020-10-10 NOTE — BH Assessment (Signed)
[facility] Assessment Progress Note  Per Maxie Barb, NP, this pt does not require psychiatric hospitalization at this time.  Pt presents under IVC initiated by pt's residential facility administrator which has been rescinded by EDP Arby Barrette, MD.  Pt is psychiatrically cleared.  Discharge instructions advise pt to continue treatment with his current outpatient provider.  Pt may not have a provider at this time, however, so [facility] information has been included as an alternative.  Dr Donnald Garre and pt's nurse, Waynetta Sandy, have been notified.  Doylene Canning, MA Triage Specialist [phone]

## 2020-10-11 ENCOUNTER — Emergency Department (HOSPITAL_COMMUNITY): Payer: Medicaid Other

## 2020-10-11 ENCOUNTER — Inpatient Hospital Stay (HOSPITAL_COMMUNITY)
Admission: EM | Admit: 2020-10-11 | Discharge: 2020-10-15 | DRG: 917 | Disposition: A | Discharge status: Home / Self Care | Payer: Medicaid Other | Attending: Internal Medicine | Admitting: Internal Medicine

## 2020-10-11 ENCOUNTER — Other Ambulatory Visit: Payer: Self-pay

## 2020-10-11 ENCOUNTER — Encounter (HOSPITAL_COMMUNITY): Payer: Self-pay

## 2020-10-11 DIAGNOSIS — F200 Paranoid schizophrenia: Secondary | ICD-10-CM | POA: Diagnosis present

## 2020-10-11 DIAGNOSIS — T40601A Poisoning by unspecified narcotics, accidental (unintentional), initial encounter: Principal | ICD-10-CM | POA: Diagnosis present

## 2020-10-11 DIAGNOSIS — F209 Schizophrenia, unspecified: Secondary | ICD-10-CM | POA: Diagnosis present

## 2020-10-11 DIAGNOSIS — E860 Dehydration: Secondary | ICD-10-CM | POA: Diagnosis present

## 2020-10-11 DIAGNOSIS — N179 Acute kidney failure, unspecified: Secondary | ICD-10-CM | POA: Diagnosis present

## 2020-10-11 DIAGNOSIS — E119 Type 2 diabetes mellitus without complications: Secondary | ICD-10-CM | POA: Diagnosis present

## 2020-10-11 DIAGNOSIS — R4182 Altered mental status, unspecified: Secondary | ICD-10-CM | POA: Diagnosis present

## 2020-10-11 DIAGNOSIS — Z87442 Personal history of urinary calculi: Secondary | ICD-10-CM

## 2020-10-11 DIAGNOSIS — Z794 Long term (current) use of insulin: Secondary | ICD-10-CM

## 2020-10-11 DIAGNOSIS — G928 Other toxic encephalopathy: Secondary | ICD-10-CM | POA: Diagnosis present

## 2020-10-11 DIAGNOSIS — Z79899 Other long term (current) drug therapy: Secondary | ICD-10-CM

## 2020-10-11 DIAGNOSIS — Z888 Allergy status to other drugs, medicaments and biological substances status: Secondary | ICD-10-CM

## 2020-10-11 DIAGNOSIS — R4189 Other symptoms and signs involving cognitive functions and awareness: Secondary | ICD-10-CM | POA: Insufficient documentation

## 2020-10-11 DIAGNOSIS — Z7984 Long term (current) use of oral hypoglycemic drugs: Secondary | ICD-10-CM

## 2020-10-11 DIAGNOSIS — I959 Hypotension, unspecified: Secondary | ICD-10-CM | POA: Diagnosis present

## 2020-10-11 DIAGNOSIS — Z7901 Long term (current) use of anticoagulants: Secondary | ICD-10-CM

## 2020-10-11 DIAGNOSIS — I10 Essential (primary) hypertension: Secondary | ICD-10-CM | POA: Diagnosis present

## 2020-10-11 DIAGNOSIS — E785 Hyperlipidemia, unspecified: Secondary | ICD-10-CM | POA: Diagnosis present

## 2020-10-11 DIAGNOSIS — Z20822 Contact with and (suspected) exposure to covid-19: Secondary | ICD-10-CM | POA: Diagnosis present

## 2020-10-11 DIAGNOSIS — R451 Restlessness and agitation: Secondary | ICD-10-CM | POA: Diagnosis present

## 2020-10-11 DIAGNOSIS — G9341 Metabolic encephalopathy: Secondary | ICD-10-CM | POA: Diagnosis present

## 2020-10-11 DIAGNOSIS — F17210 Nicotine dependence, cigarettes, uncomplicated: Secondary | ICD-10-CM | POA: Diagnosis present

## 2020-10-11 DIAGNOSIS — G40909 Epilepsy, unspecified, not intractable, without status epilepticus: Secondary | ICD-10-CM

## 2020-10-11 DIAGNOSIS — Z781 Physical restraint status: Secondary | ICD-10-CM

## 2020-10-11 DIAGNOSIS — Z880 Allergy status to penicillin: Secondary | ICD-10-CM

## 2020-10-11 DIAGNOSIS — E875 Hyperkalemia: Secondary | ICD-10-CM | POA: Diagnosis present

## 2020-10-11 DIAGNOSIS — G4733 Obstructive sleep apnea (adult) (pediatric): Secondary | ICD-10-CM | POA: Diagnosis present

## 2020-10-11 LAB — COMPREHENSIVE METABOLIC PANEL
ALT: 16 U/L (ref 0–44)
AST: 22 U/L (ref 15–41)
Albumin: 3.2 g/dL — ABNORMAL LOW (ref 3.5–5.0)
Alkaline Phosphatase: 56 U/L (ref 38–126)
Anion gap: 9 (ref 5–15)
BUN: 37 mg/dL — ABNORMAL HIGH (ref 6–20)
CO2: 22 mmol/L (ref 22–32)
Calcium: 8.9 mg/dL (ref 8.9–10.3)
Chloride: 109 mmol/L (ref 98–111)
Creatinine, Ser: 2.48 mg/dL — ABNORMAL HIGH (ref 0.61–1.24)
GFR, Estimated: 29 mL/min — ABNORMAL LOW (ref >60)
Glucose, Bld: 104 mg/dL — ABNORMAL HIGH (ref 70–99)
Potassium: 4.7 mmol/L (ref 3.5–5.1)
Sodium: 140 mmol/L (ref 135–145)
Total Bilirubin: 0.6 mg/dL (ref 0.3–1.2)
Total Protein: 5.8 g/dL — ABNORMAL LOW (ref 6.5–8.1)

## 2020-10-11 LAB — URINALYSIS, COMPLETE (UACMP) WITH MICROSCOPIC
Bacteria, UA: NONE SEEN
Bilirubin Urine: NEGATIVE
Glucose, UA: >=500 mg/dL — AB
Hgb urine dipstick: NEGATIVE
Ketones, ur: NEGATIVE mg/dL
Leukocytes,Ua: NEGATIVE
Nitrite: NEGATIVE
Protein, ur: NEGATIVE mg/dL
Specific Gravity, Urine: 1.005 (ref 1.005–1.030)
pH: 5 (ref 5.0–8.0)

## 2020-10-11 LAB — GLUCOSE, CAPILLARY: Glucose-Capillary: 115 mg/dL — ABNORMAL HIGH (ref 70–99)

## 2020-10-11 LAB — CBC WITH DIFFERENTIAL/PLATELET
Abs Immature Granulocytes: 0.05 10*3/uL (ref 0.00–0.07)
Basophils Absolute: 0 10*3/uL (ref 0.0–0.1)
Basophils Relative: 0 %
Eosinophils Absolute: 0 10*3/uL (ref 0.0–0.5)
Eosinophils Relative: 0 %
HCT: 35.8 % — ABNORMAL LOW (ref 39.0–52.0)
Hemoglobin: 11.2 g/dL — ABNORMAL LOW (ref 13.0–17.0)
Immature Granulocytes: 1 %
Lymphocytes Relative: 16 %
Lymphs Abs: 1.8 10*3/uL (ref 0.7–4.0)
MCH: 27.3 pg (ref 26.0–34.0)
MCHC: 31.3 g/dL (ref 30.0–36.0)
MCV: 87.3 fL (ref 80.0–100.0)
Monocytes Absolute: 1.1 10*3/uL — ABNORMAL HIGH (ref 0.1–1.0)
Monocytes Relative: 10 %
Neutro Abs: 7.9 10*3/uL — ABNORMAL HIGH (ref 1.7–7.7)
Neutrophils Relative %: 73 %
Platelets: 202 10*3/uL (ref 150–400)
RBC: 4.1 MIL/uL — ABNORMAL LOW (ref 4.22–5.81)
RDW: 14.5 % (ref 11.5–15.5)
WBC: 10.8 10*3/uL — ABNORMAL HIGH (ref 4.0–10.5)
nRBC: 0 % (ref 0.0–0.2)

## 2020-10-11 LAB — RESP PANEL BY RT-PCR (FLU A&B, COVID) ARPGX2
Influenza A by PCR: NEGATIVE
Influenza B by PCR: NEGATIVE
SARS Coronavirus 2 by RT PCR: NEGATIVE

## 2020-10-11 LAB — HEMOGLOBIN A1C
Hgb A1c MFr Bld: 5.2 % (ref 4.8–5.6)
Mean Plasma Glucose: 102.54 mg/dL

## 2020-10-11 LAB — AMMONIA: Ammonia: 9 umol/L (ref 9–35)

## 2020-10-11 LAB — RAPID URINE DRUG SCREEN, HOSP PERFORMED
Amphetamines: NOT DETECTED
Barbiturates: NOT DETECTED
Benzodiazepines: NOT DETECTED
Cocaine: NOT DETECTED
Opiates: NOT DETECTED
Tetrahydrocannabinol: NOT DETECTED

## 2020-10-11 LAB — ETHANOL: Alcohol, Ethyl (B): <10 mg/dL (ref <10)

## 2020-10-11 LAB — PROTIME-INR
INR: 1.2 (ref 0.8–1.2)
Prothrombin Time: 14.9 seconds (ref 11.4–15.2)

## 2020-10-11 LAB — VALPROIC ACID LEVEL: Valproic Acid Lvl: 23 ug/mL — ABNORMAL LOW (ref 50.0–100.0)

## 2020-10-11 LAB — CBG MONITORING, ED: Glucose-Capillary: 93 mg/dL (ref 70–99)

## 2020-10-11 MED ORDER — SODIUM CHLORIDE 0.9 % IV SOLN: INTRAVENOUS | Status: AC

## 2020-10-11 MED ORDER — HEPARIN SODIUM (PORCINE) 5000 UNIT/ML IJ SOLN
5000.0000 [IU] | Freq: Two times a day (BID) | INTRAMUSCULAR | Status: DC
  Administered 2020-10-11 – 2020-10-15 (×8): 5000 [IU] via SUBCUTANEOUS
  Filled 2020-10-11 (×8): qty 1

## 2020-10-11 MED ORDER — METFORMIN HCL 500 MG PO TABS: 1000.0000 mg | ORAL_TABLET | Freq: Two times a day (BID) | ORAL | Status: DC

## 2020-10-11 MED ORDER — GUAIFENESIN ER 600 MG PO TB12
1200.0000 mg | ORAL_TABLET | Freq: Two times a day (BID) | ORAL | Status: DC
  Administered 2020-10-11 – 2020-10-15 (×8): 1200 mg via ORAL
  Filled 2020-10-11 (×8): qty 2

## 2020-10-11 MED ORDER — ZOLPIDEM TARTRATE 5 MG PO TABS
5.0000 mg | ORAL_TABLET | Freq: Every day | ORAL | Status: DC
  Administered 2020-10-11 – 2020-10-14 (×4): 5 mg via ORAL
  Filled 2020-10-11 (×4): qty 1

## 2020-10-11 MED ORDER — SODIUM CHLORIDE 0.9 % IV SOLN: INTRAVENOUS | Status: DC

## 2020-10-11 MED ORDER — AMANTADINE HCL 100 MG PO CAPS
100.0000 mg | ORAL_CAPSULE | Freq: Two times a day (BID) | ORAL | Status: DC
  Administered 2020-10-12 – 2020-10-15 (×7): 100 mg via ORAL
  Filled 2020-10-11 (×9): qty 1

## 2020-10-11 MED ORDER — SODIUM CHLORIDE 0.9 % IV BOLUS
1000.0000 mL | Freq: Once | INTRAVENOUS | Status: AC
  Administered 2020-10-11: 1000 mL via INTRAVENOUS

## 2020-10-11 MED ORDER — ENOXAPARIN SODIUM 40 MG/0.4ML IJ SOSY: 40.0000 mg | PREFILLED_SYRINGE | INTRAMUSCULAR | Status: DC

## 2020-10-11 MED ORDER — HYDROXYZINE HCL 25 MG PO TABS
25.0000 mg | ORAL_TABLET | Freq: Four times a day (QID) | ORAL | Status: DC
  Administered 2020-10-11 – 2020-10-15 (×14): 25 mg via ORAL
  Filled 2020-10-11 (×16): qty 1

## 2020-10-11 MED ORDER — DIVALPROEX SODIUM 125 MG PO DR TAB
125.0000 mg | DELAYED_RELEASE_TABLET | Freq: Every morning | ORAL | Status: DC
  Administered 2020-10-12 – 2020-10-15 (×4): 125 mg via ORAL
  Filled 2020-10-11 (×5): qty 1

## 2020-10-11 MED ORDER — DAPAGLIFLOZIN PROPANEDIOL 10 MG PO TABS: 10.0000 mg | ORAL_TABLET | Freq: Every day | ORAL | Status: DC

## 2020-10-11 MED ORDER — INSULIN ASPART 100 UNIT/ML IJ SOLN
0.0000 [IU] | Freq: Three times a day (TID) | INTRAMUSCULAR | Status: DC
  Administered 2020-10-13 – 2020-10-15 (×5): 2 [IU] via SUBCUTANEOUS

## 2020-10-11 MED ORDER — CLOZAPINE 100 MG PO TABS
150.0000 mg | ORAL_TABLET | Freq: Every day | ORAL | Status: DC
  Administered 2020-10-12 – 2020-10-15 (×4): 150 mg via ORAL
  Filled 2020-10-11 (×4): qty 2

## 2020-10-11 MED ORDER — ACETAMINOPHEN 325 MG PO TABS: 650.0000 mg | ORAL_TABLET | Freq: Four times a day (QID) | ORAL | Status: DC | PRN

## 2020-10-11 MED ORDER — INSULIN GLARGINE 100 UNIT/ML ~~LOC~~ SOLN: 25.0000 [IU] | Freq: Every day | SUBCUTANEOUS | Status: DC

## 2020-10-11 MED ORDER — TRAZODONE HCL 50 MG PO TABS
50.0000 mg | ORAL_TABLET | Freq: Every day | ORAL | Status: DC
  Administered 2020-10-11 – 2020-10-12 (×2): 100 mg via ORAL
  Administered 2020-10-13 – 2020-10-14 (×2): 50 mg via ORAL
  Filled 2020-10-11: qty 1
  Filled 2020-10-11: qty 2
  Filled 2020-10-11 (×2): qty 1
  Filled 2020-10-11: qty 2

## 2020-10-11 MED ORDER — ONDANSETRON HCL 4 MG/2ML IJ SOLN: 4.0000 mg | Freq: Four times a day (QID) | INTRAMUSCULAR | Status: DC | PRN

## 2020-10-11 MED ORDER — QUETIAPINE FUMARATE 50 MG PO TABS
50.0000 mg | ORAL_TABLET | Freq: Every day | ORAL | Status: DC
  Administered 2020-10-12 – 2020-10-14 (×3): 50 mg via ORAL
  Filled 2020-10-11 (×3): qty 1

## 2020-10-11 MED ORDER — LISINOPRIL 20 MG PO TABS: 20.0000 mg | ORAL_TABLET | Freq: Every day | ORAL | Status: DC

## 2020-10-11 MED ORDER — SODIUM CHLORIDE 0.9 % IV BOLUS
1000.0000 mL | Freq: Once | INTRAVENOUS | Status: AC
Start: 2020-10-11 — End: 2020-10-11
  Administered 2020-10-11: 1000 mL via INTRAVENOUS

## 2020-10-11 MED ORDER — ALBUTEROL SULFATE (2.5 MG/3ML) 0.083% IN NEBU: 2.5000 mg | INHALATION_SOLUTION | RESPIRATORY_TRACT | Status: DC | PRN

## 2020-10-11 MED ORDER — INSULIN ASPART 100 UNIT/ML IJ SOLN: 0.0000 [IU] | Freq: Three times a day (TID) | INTRAMUSCULAR | Status: DC

## 2020-10-11 MED ORDER — HYDRALAZINE HCL 25 MG PO TABS: 25.0000 mg | ORAL_TABLET | Freq: Four times a day (QID) | ORAL | Status: DC | PRN

## 2020-10-11 MED ORDER — ACETAMINOPHEN 650 MG RE SUPP: 650.0000 mg | Freq: Four times a day (QID) | RECTAL | Status: DC | PRN

## 2020-10-11 MED ORDER — DIVALPROEX SODIUM 250 MG PO DR TAB
250.0000 mg | DELAYED_RELEASE_TABLET | Freq: Two times a day (BID) | ORAL | Status: DC
  Administered 2020-10-11 – 2020-10-14 (×7): 250 mg via ORAL
  Filled 2020-10-11 (×8): qty 1

## 2020-10-11 MED ORDER — QUETIAPINE FUMARATE 100 MG PO TABS
100.0000 mg | ORAL_TABLET | Freq: Two times a day (BID) | ORAL | Status: DC
  Administered 2020-10-11 – 2020-10-15 (×8): 100 mg via ORAL
  Filled 2020-10-11 (×9): qty 1

## 2020-10-11 MED ORDER — ONDANSETRON HCL 4 MG PO TABS: 4.0000 mg | ORAL_TABLET | Freq: Four times a day (QID) | ORAL | Status: DC | PRN

## 2020-10-11 NOTE — ED Triage Notes (Signed)
Pt BIB [facility] from assisted living home. Pt found in a chair in kitchen near medications and GCS of 7. Pt given 0.5 narcan which improved respirations. Pt BP remains low throughout transport. EMS reports medications laid freely in kitchen including narcotics, pt not visualized taking meds by staff. bolus NS improved pressure from 70s/40s to 96/63.

## 2020-10-11 NOTE — ED Notes (Signed)
Patient transported to CT 

## 2020-10-11 NOTE — ED Provider Notes (Signed)
[facility] EMERGENCY DEPARTMENT Provider Note   CSN: [id number] Arrival date & time: 10/11/20  1323     History Chief Complaint  Patient presents with  . Unresponsive    Ronald Roman is a 60 y.o. male.  HPI Presents via EMS after being discharged from our affiliated facility yesterday. Patient is essentially noninteractive, level 5 caveat. Per report patient has multiple medical issues including schizophrenia.  He was seen and evaluated at our affiliated facility 3 days ago, admitted until yesterday.  Additional details are available in chart review including medical and psychiatric clearance with discharge to group facility.  According to EMS colleagues the patient was less interactive, unresponsive today.  Patient may have had bradypnea, this improved after Narcan, 0.5 mg.  He was mildly hypotensive on arrival, but this improved with fluid resuscitation.  The patient himself awakens to mildly painful stimuli, denies pain, cannot provide any details of today's events. EMS reports patient is on multiple medications including sedatives.    Past Medical History:  Diagnosis Date  . Diabetes mellitus without complication (HCC)   . GERD (gastroesophageal reflux disease)   . Hyperlipidemia   . Hypertension   . Paranoid schizophrenia (HCC)   . Sleep apnea   . Uric acid nephrolithiasis     Patient Active Problem List   Diagnosis Date Noted  . Aggressive behavior   . Seizure (HCC) 05/07/2020  . Altered mental status 02/09/2020  . Schizophrenia (HCC)   . Leukocytosis 02/06/2015  . Sleep apnea 02/05/2015  . AKI (acute kidney injury) (HCC) 02/04/2015  . Seizure disorder (HCC) 02/04/2015  . Acute encephalopathy 02/04/2015  . Diabetes mellitus without complication (HCC)   . Hyperlipidemia   . Hypertension   . Paranoid schizophrenia (HCC)     No past surgical history on file.     No family history on file.  Social History   Tobacco Use  . Smoking  status: Current Every Day Smoker    Packs/day: 1.00    Types: Cigarettes  . Smokeless tobacco: Never Used  Substance Use Topics  . Alcohol use: No  . Drug use: No    Home Medications Prior to Admission medications   Medication Sig Start Date End Date Taking? Authorizing Provider  albuterol (PROVENTIL HFA;VENTOLIN HFA) 108 (90 BASE) MCG/ACT inhaler Inhale 2 puffs into the lungs every 4 (four) hours as needed for wheezing or shortness of breath. Patient not taking: Reported on 05/07/2020 04/21/15   Hayden Rasmussen, NP  amantadine (SYMMETREL) 100 MG capsule Take 1 capsule (100 mg total) by mouth 2 (two) times daily. 04/26/16   Oneta Rack, NP  cloZAPine (CLOZARIL) 100 MG tablet Take 150 mg by mouth daily after breakfast.     [provider]  D2000 ULTRA STRENGTH 50 MCG (2000 UT) CAPS Take 2,000 Units by mouth daily. 09/12/20   [provider]  dapagliflozin propanediol (FARXIGA) 10 MG TABS tablet Take 10 mg by mouth daily after breakfast.    [provider]  DEPAKOTE 250 MG DR tablet Take 250 mg by mouth at bedtime. 09/23/20   [provider]  divalproex (DEPAKOTE ER) 500 MG 24 hr tablet Take 2 tablets (1,000 mg total) by mouth at bedtime. 05/08/20   Sabino Dick, DO  divalproex (DEPAKOTE ER) 500 MG 24 hr tablet Take 1 tablet (500 mg total) by mouth every morning. 05/09/20   Espinoza, Myrlene Broker, DO  ELIQUIS 2.5 MG TABS tablet Take 2.5 mg by mouth 2 (two) times daily.  09/12/20   [provider]  hydrOXYzine (ATARAX/VISTARIL) 25 MG tablet Take 25 mg by mouth 4 (four) times daily. 10/17/19   [provider]  insulin glargine (LANTUS) 100 UNIT/ML injection Inject 0.2 mLs (20 Units total) into the skin at bedtime. Patient taking differently: Inject 25 Units into the skin at bedtime. 04/26/16   Oneta Rack, NP  lisinopril (ZESTRIL) 20 MG tablet TAKE 1/2 TABLET (10 MG TOTAL) BY MOUTH DAILY. 05/08/20 05/08/21  Sabino Dick, DO  melatonin  3 MG TABS tablet Take 6 mg by mouth at bedtime. 09/12/20   [provider]  metFORMIN (GLUCOPHAGE) 1000 MG tablet Take 1,000 mg by mouth 2 (two) times daily with a meal.    [provider]  QUEtiapine (SEROQUEL) 100 MG tablet Take 100 mg by mouth in the morning and at bedtime.    [provider]  QUEtiapine (SEROQUEL) 50 MG tablet Take 50 mg by mouth daily at 12 noon.    [provider]  traZODone (DESYREL) 50 MG tablet Take 50-100 mg by mouth at bedtime as needed for sleep.    [provider]  vitamin C (ASCORBIC ACID) 500 MG tablet Take 500 mg by mouth 2 (two) times daily. 09/12/20   [provider]  ZINC-220 220 (50 Zn) MG capsule Take 220 mg by mouth daily. 09/12/20   [provider]  zolpidem (AMBIEN) 5 MG tablet Take 5 mg by mouth at bedtime.    [provider]    Allergies    Cogentin [benztropine], Penicillins, and Prolixin [fluphenazine]  Review of Systems   Review of Systems  Unable to perform ROS: Psychiatric disorder    Physical Exam Updated Vital Signs SpO2 98%   Physical Exam Vitals and nursing note reviewed.  Constitutional:      General: He is not in acute distress.    Appearance: He is well-developed.     Comments: Adult male awakens easily with mildly painful stimuli  HENT:     Head: Normocephalic and atraumatic.  Eyes:     Conjunctiva/sclera: Conjunctivae normal.  Cardiovascular:     Rate and Rhythm: Normal rate and regular rhythm.  Pulmonary:     Effort: Pulmonary effort is normal. No respiratory distress.     Breath sounds: No stridor.  Abdominal:     General: There is no distension.     Tenderness: There is no abdominal tenderness. There is no guarding.  Skin:    General: Skin is warm and dry.  Neurological:     Mental Status: He is alert.     Comments: Listless, but awakens easily is all extremities spontaneously to stimuli, no facial asymmetry  Psychiatric:     Comments: No  insight, cognition impaired, memory impaired.      ED Results / Procedures / Treatments   Labs (all labs ordered are listed, but only abnormal results are displayed) Labs Reviewed  COMPREHENSIVE METABOLIC PANEL  RAPID URINE DRUG SCREEN, HOSP PERFORMED  ETHANOL  CBC WITH DIFFERENTIAL/PLATELET  PROTIME-INR  URINALYSIS, COMPLETE (UACMP) WITH MICROSCOPIC  CBG MONITORING, ED    EKG EKG Interpretation  Date/Time:  Saturday Oct 11 2020 13:27:50 EDT Ventricular Rate:  102 PR Interval:  164 QRS Duration: 80 QT Interval:  319 QTC Calculation: 416 R Axis:   78 Text Interpretation: Sinus tachycardia Abnormal ECG Confirmed by Gerhard Munch [phone]) on 10/11/2020 1:35:49 PM   Radiology No results found.  Procedures Procedures   Medications Ordered in ED Medications  sodium chloride  0.9 % bolus 1,000 mL (has no administration in time range)    And  0.9 %  sodium chloride infusion (has no administration in time range)    ED Course  I have reviewed the triage vital signs and the nursing notes.  Pertinent labs & imaging results that were available during my care of the patient were reviewed by me and considered in my medical decision making (see chart for details).   Review performed after initial evaluation including documentation from yesterday's discharge at [facility]. Patient with history of paranoid schizophrenia, recent medical and behavioral health clearance.  I talked to the patient's group home staff member after my initial evaluation.  Reportedly the patient was" normal" on returning from our affiliated facility yesterday, but today was different, with less interactivity, listlessness.  He did, seem to improve prior to transport with EMS.  Initial labs notable for creatinine increased from prior, 30%, BUN elevated, both concerning for dehydration.  CAT scan reviewed, no intracranial injury. 3:50 PM Patient resting, no distress, remains easily awakened, but  otherwise listless.  Labs pending, additional studies pending, fluid resuscitation ongoing, Dr. Silverio Lay is aware. MDM Rules/Calculators/A&P MDM Number of Diagnoses or Management Options Unresponsiveness: new, needed workup   Amount and/or Complexity of Data Reviewed Clinical lab tests: ordered and reviewed Tests in the radiology section of CPT: ordered and reviewed Tests in the medicine section of CPT: reviewed and ordered Decide to obtain previous medical records or to obtain history from someone other than the patient: yes Obtain history from someone other than the patient: yes Review and summarize past medical records: yes Discuss the patient with other providers: yes Independent visualization of images, tracings, or specimens: yes  Risk of Complications, Morbidity, and/or Mortality Presenting problems: high Diagnostic procedures: high Management options: high  Critical Care Total time providing critical care: < 30 minutes  Patient Progress Patient progress: stable  Final Clinical Impression(s) / ED Diagnoses Final diagnoses:  Unresponsiveness     Gerhard Munch, MD 10/11/20 1555

## 2020-10-11 NOTE — ED Notes (Signed)
Pt remains somnolent and unresponsive to most stimuli except pain. This RN woke pt to get urine sample and lab samples. Pt currently sleeping again.

## 2020-10-11 NOTE — H&P (Signed)
History and Physical    Ronald Roman NLG:[id number] DOB: [birth year] DOA: 10/11/2020  PCP: System, Provider Not In (Confirm with patient/family/NH records and if not entered, this has to be entered at [facility] point of entry) Patient coming from: Group home  I have personally briefly reviewed patient's old medical records in [facility]  Chief Complaint: Feeling better  HPI: Ronald Roman is a 59 y.o. male with medical history significant of schizophrenia, IDDM, HTN, HLD, kidney stones, OSA, came with after mentations.  Patient was found in the chair, unresponsive, beside him are pillboxes belongs to other residents from group home.  EMS called and 1 dose of Narcan given and patient waking up. It appears that the patient took other residents medications which according to group home record, narcotics.  Initially blood pressure was low 70s over 40s and improved with 1 bolus of 500 mL fluid to 96/63.  Patient arousable in the ED, still sleepy, no complaints of pain or shortness of breath or cough.  Patient does not remember what happened.  ED Course: CT head negative for acute findings, blood work found AKI creatinine 2.48.  1.62 days ago, glucose 104.  Renal ultrasound negative for obstructions.  Review of Systems: Unable to perform, patient confused.  Past Medical History:  Diagnosis Date  . Diabetes mellitus without complication (HCC)   . GERD (gastroesophageal reflux disease)   . Hyperlipidemia   . Hypertension   . Paranoid schizophrenia (HCC)   . Sleep apnea   . Uric acid nephrolithiasis     History reviewed. No pertinent surgical history.   reports that he has been smoking cigarettes. He has been smoking about 1.00 pack per day. He has never used smokeless tobacco. He reports that he does not drink alcohol and does not use drugs.  Allergies  Allergen Reactions  . Cogentin [Benztropine] Other (See Comments)    Per MAR   . Penicillins Other (See Comments)    Per MAR -  unable to verify patients PCN reaction   . Prolixin [Fluphenazine] Other (See Comments)    Per MAR     History reviewed. No pertinent family history.   Prior to Admission medications   Medication Sig Start Date End Date Taking? Authorizing Provider  amantadine (SYMMETREL) 100 MG capsule Take 1 capsule (100 mg total) by mouth 2 (two) times daily. 04/26/16  Yes Oneta Rack, NP  Clozapine 150 MG TBDP Take 150 mg by mouth daily after breakfast.    Yes [provider]  dapagliflozin propanediol (FARXIGA) 10 MG TABS tablet Take 10 mg by mouth daily after breakfast.   Yes [provider]  DEPAKOTE 250 MG DR tablet Take 250 mg by mouth See admin instructions. Take one at 12PM and one at bedtime. 09/23/20  Yes [provider]  divalproex (DEPAKOTE) 125 MG DR tablet Take 125 mg by mouth daily.   Yes [provider]  hydrOXYzine (ATARAX/VISTARIL) 25 MG tablet Take 25 mg by mouth 4 (four) times daily. 10/17/19  Yes [provider]  insulin glargine (LANTUS) 100 UNIT/ML injection Inject 0.2 mLs (20 Units total) into the skin at bedtime. Patient taking differently: Inject 25 Units into the skin at bedtime. 04/26/16  Yes Oneta Rack, NP  metFORMIN (GLUCOPHAGE) 1000 MG tablet Take 1,000 mg by mouth 2 (two) times daily with a meal.   Yes [provider]  QUEtiapine (SEROQUEL) 100 MG tablet Take 100 mg by mouth in the morning and at bedtime.  Yes [provider]  QUEtiapine (SEROQUEL) 50 MG tablet Take 50 mg by mouth daily at 12 noon.   Yes [provider]  traZODone (DESYREL) 50 MG tablet Take 50-100 mg by mouth at bedtime.   Yes [provider]  zolpidem (AMBIEN) 5 MG tablet Take 5 mg by mouth at bedtime.   Yes [provider]  albuterol (PROVENTIL HFA;VENTOLIN HFA) 108 (90 BASE) MCG/ACT inhaler Inhale 2 puffs into the lungs every 4 (four) hours as needed for wheezing or shortness of breath. Patient not taking: No  sig reported 04/21/15   Hayden Rasmussen, NP  divalproex (DEPAKOTE ER) 500 MG 24 hr tablet Take 2 tablets (1,000 mg total) by mouth at bedtime. Patient not taking: No sig reported 05/08/20   Sabino Dick, DO  divalproex (DEPAKOTE ER) 500 MG 24 hr tablet Take 1 tablet (500 mg total) by mouth every morning. Patient not taking: No sig reported 05/09/20   Sabino Dick, DO  lisinopril (ZESTRIL) 20 MG tablet TAKE 1/2 TABLET (10 MG TOTAL) BY MOUTH DAILY. Patient taking differently: Take 20 mg by mouth daily. 05/08/20 05/08/21  Sabino Dick, DO    Physical Exam: Vitals:   10/11/20 1700 10/11/20 1715 10/11/20 1730 10/11/20 1745  BP: 128/85 124/89 123/84 131/88  Pulse: 75 82 86 76  Resp: 14 15 15 15   Temp:      TempSrc:      SpO2: 100% 100% 100% 100%    Constitutional: NAD, calm, comfortable Vitals:   10/11/20 1700 10/11/20 1715 10/11/20 1730 10/11/20 1745  BP: 128/85 124/89 123/84 131/88  Pulse: 75 82 86 76  Resp: 14 15 15 15   Temp:      TempSrc:      SpO2: 100% 100% 100% 100%   Eyes: PERRL, lids and conjunctivae normal, pupils are 0.5 mm and sluggish ENMT: Mucous membranes are dry. Posterior pharynx clear of any exudate or lesions.Normal dentition.  Neck: normal, supple, no masses, no thyromegaly Respiratory: clear to auscultation bilaterally, no wheezing, no crackles. Normal respiratory effort. No accessory muscle use.  Cardiovascular: Regular rate and rhythm, no murmurs / rubs / gallops. No extremity edema. 2+ pedal pulses. No carotid bruits.  Abdomen: no tenderness, no masses palpated. No hepatosplenomegaly. Bowel sounds positive.  Musculoskeletal: no clubbing / cyanosis. No joint deformity upper and lower extremities. Good ROM, no contractures. Normal muscle tone.  Skin: no rashes, lesions, ulcers. No induration Neurologic: No facial droops, moving all limbs Psychiatric: Arousable, following simple commands.    Labs on Admission: I have personally reviewed  following labs and imaging studies  CBC: Recent Labs  Lab 10/05/20 1619 10/09/20 1138 10/11/20 1335  WBC 10.0 9.6 10.8*  NEUTROABS 7.0  --  7.9*  HGB 12.3* 12.8* 11.2*  HCT 38.9* 40.2 35.8*  MCV 87.0 86.5 87.3  PLT 247 210 202   Basic Metabolic Panel: Recent Labs  Lab 10/05/20 1619 10/09/20 1138 10/11/20 1335  NA 139 138 140  K 5.0 4.6 4.7  CL 105 108 109  CO2 26 22 22   GLUCOSE 95 67* 104*  BUN 28* 29* 37*  CREATININE 1.64* 1.65* 2.48*  CALCIUM 9.5 9.5 8.9   GFR: Estimated Creatinine Clearance: 35.2 mL/min (A) (by C-G formula based on SCr of 2.48 mg/dL (H)). Liver Function Tests: Recent Labs  Lab 10/05/20 1619 10/09/20 1138 10/11/20 1335  AST 20 24 22   ALT 25 17 16   ALKPHOS 66 62 56  BILITOT 0.4 0.5 0.6  PROT 7.0 7.0 5.8*  ALBUMIN 3.7 3.6 3.2*   No results for input(s): LIPASE, AMYLASE in the last 168 hours. Recent Labs  Lab 10/11/20 1618  AMMONIA 9   Coagulation Profile: Recent Labs  Lab 10/11/20 1335  INR 1.2   Cardiac Enzymes: No results for input(s): CKTOTAL, CKMB, CKMBINDEX, TROPONINI in the last 168 hours. BNP (last 3 results) No results for input(s): PROBNP in the last 8760 hours. HbA1C: No results for input(s): HGBA1C in the last 72 hours. CBG: Recent Labs  Lab 10/09/20 1911 10/11/20 1409  GLUCAP 111* 93   Lipid Profile: No results for input(s): CHOL, HDL, LDLCALC, TRIG, CHOLHDL, LDLDIRECT in the last 72 hours. Thyroid Function Tests: No results for input(s): TSH, T4TOTAL, FREET4, T3FREE, THYROIDAB in the last 72 hours. Anemia Panel: No results for input(s): VITAMINB12, FOLATE, FERRITIN, TIBC, IRON, RETICCTPCT in the last 72 hours. Urine analysis:    Component Value Date/Time   COLORURINE STRAW (A) 10/11/2020 1618   APPEARANCEUR CLEAR 10/11/2020 1618   LABSPEC 1.005 10/11/2020 1618   PHURINE 5.0 10/11/2020 1618   GLUCOSEU >=500 (A) 10/11/2020 1618   HGBUR NEGATIVE 10/11/2020 1618   BILIRUBINUR NEGATIVE 10/11/2020 1618    KETONESUR NEGATIVE 10/11/2020 1618   PROTEINUR NEGATIVE 10/11/2020 1618   UROBILINOGEN 0.2 02/04/2015 0059   NITRITE NEGATIVE 10/11/2020 1618   LEUKOCYTESUR NEGATIVE 10/11/2020 1618    Radiological Exams on Admission: CT HEAD WO CONTRAST  Result Date: 10/11/2020 CLINICAL DATA:  Mental status change. EXAM: CT HEAD WITHOUT CONTRAST TECHNIQUE: Contiguous axial images were obtained from the base of the skull through the vertex without intravenous contrast. COMPARISON:  Oct 05, 2020 FINDINGS: Brain: No evidence of acute infarction, hemorrhage, hydrocephalus, extra-axial collection or mass lesion/mass effect. Vascular: Calcified atherosclerosis in the intracranial carotids. Skull: Normal. Negative for fracture or focal lesion. Sinuses/Orbits: No acute finding. Other: None. IMPRESSION: No acute abnormalities identified. Electronically Signed   By: Gerome Sam III M.D   On: 10/11/2020 14:16   US Renal  Result Date: 10/11/2020 CLINICAL DATA:  Renal failure EXAM: RENAL / URINARY TRACT ULTRASOUND COMPLETE COMPARISON:  None. FINDINGS: Right Kidney: Renal measurements: 10.0 x 5.1 x 4.8 cm = volume: 128 mL. Echogenicity within normal limits. No mass or hydronephrosis visualized. Left Kidney: Renal measurements: 10.2 x 6.2 x 4.2 cm = volume: 138 mL. 8 normal echotexture. No hydronephrosis. 3.3 cm midpole cyst. Bladder: Appears normal for degree of bladder distention. Other: None. IMPRESSION: No acute findings.  No hydronephrosis. Electronically Signed   By: Charlett Nose M.D.   On: 10/11/2020 16:01   DG Chest Port 1 View  Result Date: 10/11/2020 CLINICAL DATA:  Unresponsive. EXAM: PORTABLE CHEST 1 VIEW COMPARISON:  None. FINDINGS: The cardiomediastinal silhouette is unremarkable. Increased MEDIAL RIGHT LOWER lung opacity noted. No consolidation, pleural effusion, pneumothorax or acute bony abnormality noted. IMPRESSION: Increased MEDIAL RIGHT LOWER lung opacity which may represent atelectasis or airspace  disease/pneumonia. Electronically Signed   By: Harmon Pier M.D.   On: 10/11/2020 14:27    EKG: Independently reviewed. Sinus tachycardia  Assessment/Plan Active Problems:   AMS (altered mental status)  (please populate well all problems here in Problem List. (For example, if patient is on BP meds at home and you resume or decide to hold them, it is a problem that needs to be her. Same for CAD, COPD, HLD and so on)   Acute toxic encephalopathy -GCS=13 -Suspect narcotic overdose, despite a negative UDS, breathing status stabilized and mentation also improved, will monitor off further Narcan. -Start  IV fluid. -Cannot rule out hypoglycemia, [facility] 67 [facility].  Check A1c and discontinue long-acting insulin and will p.o. diabetic medications.  Start thin sliding scale.  AKI -Appears to be hypovolemic and dehydrated, continue maintenance IV fluid -Renal ultrasound negative, will check uric acid level.  Schizophrenia -Reevaluate in AM -Continue home anti-psychiatry medications  HTN -Hold ACEI -PRN Hydralazine  IDDM -As above, discontinue long-acting insulin, and all oral diabetic medications  DVT prophylaxis: Heparin subcu Code Status: Full Code Family Communication: None at bedside Disposition Plan: Expect less than 2 midnight hospital stay Consults called: None  Admission status: Tele obs   Emeline GeneralPing T Larz Mark MD [facility] Pager [phone]  10/11/2020, 6:09 PM

## 2020-10-11 NOTE — ED Notes (Signed)
Patient transported to Ultrasound 

## 2020-10-11 NOTE — ED Provider Notes (Signed)
  Physical Exam  BP 131/88   Pulse 76   Temp 98.1 F (36.7 C) (Oral)   Resp 15   SpO2 100%   Physical Exam  ED Course/Procedures     Procedures  MDM  Patient care assumed at 3 pm. Patient here with altered mental status.  Signout pending urinalysis and UDS.  6:00 PM Patient is more arousable but still unable to even sit up. CT head unremarkable.  UDS is negative.  I got ammonia level that was negative.  His Depakote level is low.  He is likely not taking his medicines.  At this point given persistent confusion, will admit patient.        Charlynne Pander, MD 10/11/20 [phone]

## 2020-10-12 DIAGNOSIS — F209 Schizophrenia, unspecified: Secondary | ICD-10-CM | POA: Diagnosis not present

## 2020-10-12 DIAGNOSIS — R451 Restlessness and agitation: Secondary | ICD-10-CM | POA: Diagnosis not present

## 2020-10-12 DIAGNOSIS — R4182 Altered mental status, unspecified: Secondary | ICD-10-CM | POA: Diagnosis not present

## 2020-10-12 DIAGNOSIS — Z79899 Other long term (current) drug therapy: Secondary | ICD-10-CM

## 2020-10-12 LAB — GLUCOSE, CAPILLARY
Glucose-Capillary: 84 mg/dL (ref 70–99)
Glucose-Capillary: 104 mg/dL — ABNORMAL HIGH (ref 70–99)
Glucose-Capillary: 87 mg/dL (ref 70–99)
Glucose-Capillary: 241 mg/dL — ABNORMAL HIGH (ref 70–99)

## 2020-10-12 LAB — BLOOD GAS, VENOUS
Acid-base deficit: 2.7 mmol/L — ABNORMAL HIGH (ref 0.0–2.0)
Bicarbonate: 22.7 mmol/L (ref 20.0–28.0)
FIO2: 100
O2 Saturation: 40.3 %
Patient temperature: 37
pCO2, Ven: 47.3 mmHg (ref 44.0–60.0)
pH, Ven: 7.303 (ref 7.250–7.430)
pO2, Ven: <31 mmHg — CL (ref 32.0–45.0)

## 2020-10-12 LAB — BASIC METABOLIC PANEL
Anion gap: 8 (ref 5–15)
BUN: 31 mg/dL — ABNORMAL HIGH (ref 6–20)
CO2: 23 mmol/L (ref 22–32)
Calcium: 8.7 mg/dL — ABNORMAL LOW (ref 8.9–10.3)
Chloride: 110 mmol/L (ref 98–111)
Creatinine, Ser: 1.52 mg/dL — ABNORMAL HIGH (ref 0.61–1.24)
GFR, Estimated: 52 mL/min — ABNORMAL LOW (ref >60)
Glucose, Bld: 85 mg/dL (ref 70–99)
Potassium: 4.7 mmol/L (ref 3.5–5.1)
Sodium: 141 mmol/L (ref 135–145)

## 2020-10-12 LAB — CBC
HCT: 36.8 % — ABNORMAL LOW (ref 39.0–52.0)
Hemoglobin: 11.8 g/dL — ABNORMAL LOW (ref 13.0–17.0)
MCH: 27.8 pg (ref 26.0–34.0)
MCHC: 32.1 g/dL (ref 30.0–36.0)
MCV: 86.8 fL (ref 80.0–100.0)
Platelets: 195 10*3/uL (ref 150–400)
RBC: 4.24 MIL/uL (ref 4.22–5.81)
RDW: 14.6 % (ref 11.5–15.5)
WBC: 11.2 10*3/uL — ABNORMAL HIGH (ref 4.0–10.5)
nRBC: 0 % (ref 0.0–0.2)

## 2020-10-12 LAB — URIC ACID: Uric Acid, Serum: 6.4 mg/dL (ref 3.7–8.6)

## 2020-10-12 MED ORDER — LORAZEPAM 1 MG PO TABS
2.0000 mg | ORAL_TABLET | Freq: Once | ORAL | Status: AC
  Administered 2020-10-12: 2 mg via ORAL
  Filled 2020-10-12: qty 2

## 2020-10-12 MED ORDER — HALOPERIDOL LACTATE 5 MG/ML IJ SOLN
2.0000 mg | Freq: Once | INTRAMUSCULAR | Status: AC
  Administered 2020-10-12: 2 mg via INTRAMUSCULAR
  Filled 2020-10-12: qty 1

## 2020-10-12 NOTE — Plan of Care (Signed)

## 2020-10-12 NOTE — Progress Notes (Signed)
PROGRESS NOTE  Ronald Roman UUV:[id number] DOB: 20-Mar-1961 DOA: 10/11/2020 PCP: System, Provider Not In   LOS: 0 days   Brief narrative:  Ronald Roman is a 60 y.o. male with past medical history significant of schizophrenia, IDDM, HTN, HLD, kidney stones, OSA, came with altered mental status.  Patient was found to be unresponsive in the chair and discharged him with pillboxes that belonged to other residents from group home.  He was given 1 dose of Narcan and patient started waking up.  Initially blood pressure was low in the 780s which improved with bolus of IV fluids.  In the ED patient was arousable but still sleepy and was placed in observation hospital.  CT head scan was negative.  Creatinine was elevated at 2.2..  Renal ultrasound was negative for obstruction.  Patient was then placed in observation in the hospital.  Assessment/Plan:  Active Problems:   AMS (altered mental status)  Acute toxic encephalopathy Suspected polypharmacy narcotic overdose.  Received Narcan.  Much more alert this morning and was actually more agitated.  Required sedation with Haldol and Ativan.  Blood glucose level was 104 this morning.  Off long-acting insulin and continue sliding scale insulin for now due to previous history of hypoglycemia.  Diabetic diet.  Hemoglobin A1c at 5.2.  Agitation confusion this morning.  Patient required restraints one-to-one sitter Ativan p.o. and IM Haldol.  We will closely monitor.  Will consult psychiatry due to history of schizophrenia.  Acute kidney injury. And appeared to be hypokalemia vomiting on presentation.  Continue IV fluids.  Check BMP in AM.  Renal ultrasound was negative.  Uric acid was 6.4.  Schizophrenia -Reevaluate in AM -Continue home anti-psychiatry medications  Essential hypertension. ACE inhibitor is on hold.  Continue hydralazine.  Closely monitor blood pressure.  Diabetes mellitus.  On long-acting insulin.  Currently on hold.  We will  continue with sliding scale insulin diabetic diet.  DVT prophylaxis: heparin injection 5,000 Units Start: 10/11/20 2200   Code Status: Full code  Family Communication: None  Status is: Observation  The patient will require care spanning > 2 midnights and should be moved to inpatient because: IV treatments appropriate due to intensity of illness or inability to take PO and Inpatient level of care appropriate due to severity of illness  Dispo: The patient is from: Group home              Anticipated d/c is to: Group home              Patient currently is not medically stable to d/c.   Difficult to place patient No   Consultants:  None  Procedures:  None  Anti-infectives:  . None  Anti-infectives (From admission, onward)   None     Subjective: Today, patient was seen and examined at bedside.  Patient had pulled out IV and was very agitated this morning.  Needed sedation and restraints to prevent harm  Objective: Vitals:   10/12/20 0826 10/12/20 1259  BP: 118/77 114/82  Pulse: 84 (!) 110  Resp: 18 19  Temp: 98.3 F (36.8 C) (!) 97.4 F (36.3 C)  SpO2: 100% 100%    Intake/Output Summary (Last 24 hours) at 10/12/2020 1450 Last data filed at 10/12/2020 1000 Gross per 24 hour  Intake 2711.4 ml  Output 1750 ml  Net 961.4 ml   There were no vitals filed for this visit. There is no height or weight on file to calculate BMI.   Physical Exam:  GENERAL: Patient is alert awake and communicative, mildly agitated, naked,. Not in obvious distress. HENT: No scleral pallor or icterus. Pupils equally reactive to light. Oral mucosa is moist NECK: is supple, no gross swelling noted. CHEST: Clear to auscultation. No crackles or wheezes.  Diminished breath sounds bilaterally. CVS: S1 and S2 heard, no murmur. Regular rate and rhythm.  ABDOMEN: Soft, non-tender, bowel sounds are present. EXTREMITIES: No edema. CNS: Cranial nerves are intact. No focal motor deficits. SKIN: warm  and dry without rashes.  Data Review: I have personally reviewed the following laboratory data and studies,  CBC: Recent Labs  Lab 10/05/20 1619 10/09/20 1138 10/11/20 1335 10/12/20 0117  WBC 10.0 9.6 10.8* 11.2*  NEUTROABS 7.0  --  7.9*  --   HGB 12.3* 12.8* 11.2* 11.8*  HCT 38.9* 40.2 35.8* 36.8*  MCV 87.0 86.5 87.3 86.8  PLT 247 210 202 195   Basic Metabolic Panel: Recent Labs  Lab 10/05/20 1619 10/09/20 1138 10/11/20 1335 10/12/20 0117  NA 139 138 140 141  K 5.0 4.6 4.7 4.7  CL 105 108 109 110  CO2 26 22 22 23   GLUCOSE 95 67* 104* 85  BUN 28* 29* 37* 31*  CREATININE 1.64* 1.65* 2.48* 1.52*  CALCIUM 9.5 9.5 8.9 8.7*   Liver Function Tests: Recent Labs  Lab 10/05/20 1619 10/09/20 1138 10/11/20 1335  AST 20 24 22   ALT 25 17 16   ALKPHOS 66 62 56  BILITOT 0.4 0.5 0.6  PROT 7.0 7.0 5.8*  ALBUMIN 3.7 3.6 3.2*   No results for input(s): LIPASE, AMYLASE in the last 168 hours. Recent Labs  Lab 10/11/20 1618  AMMONIA 9   Cardiac Enzymes: No results for input(s): CKTOTAL, CKMB, CKMBINDEX, TROPONINI in the last 168 hours. BNP (last 3 results) Recent Labs    05/07/20 1028  BNP 54.5    ProBNP (last 3 results) No results for input(s): PROBNP in the last 8760 hours.  CBG: Recent Labs  Lab 10/09/20 1911 10/11/20 1409 10/11/20 2217 10/12/20 0630 10/12/20 1300  GLUCAP 111* 93 115* 84 104*   Recent Results (from the past 240 hour(s))  Resp Panel by RT-PCR (Flu A&B, Covid) Nasopharyngeal Swab     Status: None   Collection Time: 10/11/20  4:18 PM   Specimen: Nasopharyngeal Swab; Nasopharyngeal(NP) swabs in vial transport medium  Result Value Ref Range Status   SARS Coronavirus 2 by RT PCR NEGATIVE NEGATIVE Final    Comment: (NOTE) SARS-CoV-2 target nucleic acids are NOT DETECTED.  The SARS-CoV-2 RNA is generally detectable in upper respiratory specimens during the acute phase of infection. The lowest concentration of SARS-CoV-2 viral copies this  assay can detect is 138 copies/mL. A negative result does not preclude SARS-Cov-2 infection and should not be used as the sole basis for treatment or other patient management decisions. A negative result may occur with  improper specimen collection/handling, submission of specimen other than nasopharyngeal swab, presence of viral mutation(s) within the areas targeted by this assay, and inadequate number of viral copies(<138 copies/mL). A negative result must be combined with clinical observations, patient history, and epidemiological information. The expected result is Negative.  Fact Sheet for Patients:  10/14/20  Fact Sheet for Healthcare Providers:  10/14/20  This test is no t yet approved or cleared by the 10/13/20 FDA and  has been authorized for detection and/or diagnosis of SARS-CoV-2 by FDA under an Emergency Use Authorization (EUA). This EUA will remain  in effect (meaning this test can  be used) for the duration of the COVID-19 declaration under Section 564(b)(1) of the Act, 21 U.S.C.section 360bbb-3(b)(1), unless the authorization is terminated  or revoked sooner.       Influenza A by PCR NEGATIVE NEGATIVE Final   Influenza B by PCR NEGATIVE NEGATIVE Final    Comment: (NOTE) The Xpert Xpress SARS-CoV-2/FLU/RSV plus assay is intended as an aid in the diagnosis of influenza from Nasopharyngeal swab specimens and should not be used as a sole basis for treatment. Nasal washings and aspirates are unacceptable for Xpert Xpress SARS-CoV-2/FLU/RSV testing.  Fact Sheet for Patients: BloggerCourse.com  Fact Sheet for Healthcare Providers: SeriousBroker.it  This test is not yet approved or cleared by the [place] FDA and has been authorized for detection and/or diagnosis of SARS-CoV-2 by FDA under an Emergency Use Authorization (EUA). This EUA will  remain in effect (meaning this test can be used) for the duration of the COVID-19 declaration under Section 564(b)(1) of the Act, 21 U.S.C. section 360bbb-3(b)(1), unless the authorization is terminated or revoked.  Performed at [facility], [address], [place], [name] [address]      Studies: CT HEAD WO CONTRAST  Result Date: 10/11/2020 CLINICAL DATA:  Mental status change. EXAM: CT HEAD WITHOUT CONTRAST TECHNIQUE: Contiguous axial images were obtained from the base of the skull through the vertex without intravenous contrast. COMPARISON:  Oct 05, 2020 FINDINGS: Brain: No evidence of acute infarction, hemorrhage, hydrocephalus, extra-axial collection or mass lesion/mass effect. Vascular: Calcified atherosclerosis in the intracranial carotids. Skull: Normal. Negative for fracture or focal lesion. Sinuses/Orbits: No acute finding. Other: None. IMPRESSION: No acute abnormalities identified. Electronically Signed   By: Gerome Sam III M.D   On: 10/11/2020 14:16   US Renal  Result Date: 10/11/2020 CLINICAL DATA:  Renal failure EXAM: RENAL / URINARY TRACT ULTRASOUND COMPLETE COMPARISON:  None. FINDINGS: Right Kidney: Renal measurements: 10.0 x 5.1 x 4.8 cm = volume: 128 mL. Echogenicity within normal limits. No mass or hydronephrosis visualized. Left Kidney: Renal measurements: 10.2 x 6.2 x 4.2 cm = volume: 138 mL. 8 normal echotexture. No hydronephrosis. 3.3 cm midpole cyst. Bladder: Appears normal for degree of bladder distention. Other: None. IMPRESSION: No acute findings.  No hydronephrosis. Electronically Signed   By: Charlett Nose M.D.   On: 10/11/2020 16:01   DG Chest Port 1 View  Result Date: 10/11/2020 CLINICAL DATA:  Unresponsive. EXAM: PORTABLE CHEST 1 VIEW COMPARISON:  None. FINDINGS: The cardiomediastinal silhouette is unremarkable. Increased MEDIAL RIGHT LOWER lung opacity noted. No consolidation, pleural effusion, pneumothorax or acute bony abnormality noted. IMPRESSION:  Increased MEDIAL RIGHT LOWER lung opacity which may represent atelectasis or airspace disease/pneumonia. Electronically Signed   By: Harmon Pier M.D.   On: 10/11/2020 14:27      Joycelyn Das, MD  [facility] 10/12/2020  If 7PM-7AM, please contact night-coverage

## 2020-10-12 NOTE — Plan of Care (Signed)
  Problem: Education: Goal: Knowledge of General Education information will improve Description: Including pain rating scale, medication(s)/side effects and non-pharmacologic comfort measures 10/12/2020 1228 by Mamie Nick I, RN Outcome: Progressing 10/12/2020 1204 by Mamie Nick I, RN Outcome: Progressing   Problem: Health Behavior/Discharge Planning: Goal: Ability to manage health-related needs will improve 10/12/2020 1228 by Mamie Nick I, RN Outcome: Progressing 10/12/2020 1204 by Mamie Nick I, RN Outcome: Progressing   Problem: Clinical Measurements: Goal: Ability to maintain clinical measurements within normal limits will improve 10/12/2020 1228 by Mamie Nick I, RN Outcome: Progressing 10/12/2020 1204 by Mamie Nick I, RN Outcome: Progressing Goal: Will remain free from infection 10/12/2020 1228 by Mamie Nick I, RN Outcome: Progressing 10/12/2020 1204 by Mamie Nick I, RN Outcome: Progressing Goal: Diagnostic test results will improve 10/12/2020 1228 by Mamie Nick I, RN Outcome: Progressing 10/12/2020 1204 by Mamie Nick I, RN Outcome: Progressing Goal: Respiratory complications will improve 10/12/2020 1228 by Mamie Nick I, RN Outcome: Progressing 10/12/2020 1204 by Mamie Nick I, RN Outcome: Progressing Goal: Cardiovascular complication will be avoided 10/12/2020 1228 by Mamie Nick I, RN Outcome: Progressing 10/12/2020 1204 by Mamie Nick I, RN Outcome: Progressing   Problem: Activity: Goal: Risk for activity intolerance will decrease 10/12/2020 1228 by Mamie Nick I, RN Outcome: Progressing 10/12/2020 1204 by Mamie Nick I, RN Outcome: Progressing   Problem: Nutrition: Goal: Adequate nutrition will be maintained 10/12/2020 1228 by Mamie Nick I, RN Outcome: Progressing 10/12/2020 1204 by Mamie Nick I, RN Outcome: Progressing   Problem: Coping: Goal: Level  of anxiety will decrease 10/12/2020 1228 by Mamie Nick I, RN Outcome: Progressing 10/12/2020 1204 by Mamie Nick I, RN Outcome: Progressing   Problem: Elimination: Goal: Will not experience complications related to bowel motility 10/12/2020 1228 by Mamie Nick I, RN Outcome: Progressing 10/12/2020 1204 by Mamie Nick I, RN Outcome: Progressing Goal: Will not experience complications related to urinary retention 10/12/2020 1228 by Mamie Nick I, RN Outcome: Progressing 10/12/2020 1204 by Mamie Nick I, RN Outcome: Progressing   Problem: Pain Managment: Goal: General experience of comfort will improve 10/12/2020 1228 by Mamie Nick I, RN Outcome: Progressing 10/12/2020 1204 by Mamie Nick I, RN Outcome: Progressing   Problem: Safety: Goal: Ability to remain free from injury will improve 10/12/2020 1228 by Mamie Nick I, RN Outcome: Progressing 10/12/2020 1204 by Mamie Nick I, RN Outcome: Progressing   Problem: Skin Integrity: Goal: Risk for impaired skin integrity will decrease 10/12/2020 1228 by Mamie Nick I, RN Outcome: Progressing 10/12/2020 1204 by Mamie Nick I, RN Outcome: Progressing

## 2020-10-13 DIAGNOSIS — E785 Hyperlipidemia, unspecified: Secondary | ICD-10-CM | POA: Diagnosis present

## 2020-10-13 DIAGNOSIS — F200 Paranoid schizophrenia: Secondary | ICD-10-CM | POA: Diagnosis present

## 2020-10-13 DIAGNOSIS — G9341 Metabolic encephalopathy: Secondary | ICD-10-CM | POA: Diagnosis present

## 2020-10-13 DIAGNOSIS — Z794 Long term (current) use of insulin: Secondary | ICD-10-CM | POA: Diagnosis not present

## 2020-10-13 DIAGNOSIS — R451 Restlessness and agitation: Secondary | ICD-10-CM | POA: Diagnosis present

## 2020-10-13 DIAGNOSIS — Z781 Physical restraint status: Secondary | ICD-10-CM | POA: Diagnosis not present

## 2020-10-13 DIAGNOSIS — G4733 Obstructive sleep apnea (adult) (pediatric): Secondary | ICD-10-CM | POA: Diagnosis present

## 2020-10-13 DIAGNOSIS — F209 Schizophrenia, unspecified: Secondary | ICD-10-CM | POA: Diagnosis not present

## 2020-10-13 DIAGNOSIS — G928 Other toxic encephalopathy: Secondary | ICD-10-CM | POA: Diagnosis present

## 2020-10-13 DIAGNOSIS — Z87442 Personal history of urinary calculi: Secondary | ICD-10-CM | POA: Diagnosis not present

## 2020-10-13 DIAGNOSIS — Z7984 Long term (current) use of oral hypoglycemic drugs: Secondary | ICD-10-CM | POA: Diagnosis not present

## 2020-10-13 DIAGNOSIS — Z880 Allergy status to penicillin: Secondary | ICD-10-CM | POA: Diagnosis not present

## 2020-10-13 DIAGNOSIS — N179 Acute kidney failure, unspecified: Secondary | ICD-10-CM | POA: Diagnosis present

## 2020-10-13 DIAGNOSIS — Z20822 Contact with and (suspected) exposure to covid-19: Secondary | ICD-10-CM | POA: Diagnosis present

## 2020-10-13 DIAGNOSIS — Z7901 Long term (current) use of anticoagulants: Secondary | ICD-10-CM | POA: Diagnosis not present

## 2020-10-13 DIAGNOSIS — F17210 Nicotine dependence, cigarettes, uncomplicated: Secondary | ICD-10-CM | POA: Diagnosis present

## 2020-10-13 DIAGNOSIS — T40601A Poisoning by unspecified narcotics, accidental (unintentional), initial encounter: Secondary | ICD-10-CM | POA: Diagnosis present

## 2020-10-13 DIAGNOSIS — E119 Type 2 diabetes mellitus without complications: Secondary | ICD-10-CM | POA: Diagnosis present

## 2020-10-13 DIAGNOSIS — I10 Essential (primary) hypertension: Secondary | ICD-10-CM | POA: Diagnosis present

## 2020-10-13 DIAGNOSIS — E875 Hyperkalemia: Secondary | ICD-10-CM | POA: Diagnosis present

## 2020-10-13 DIAGNOSIS — I959 Hypotension, unspecified: Secondary | ICD-10-CM | POA: Diagnosis present

## 2020-10-13 DIAGNOSIS — Z79899 Other long term (current) drug therapy: Secondary | ICD-10-CM | POA: Diagnosis not present

## 2020-10-13 DIAGNOSIS — G40909 Epilepsy, unspecified, not intractable, without status epilepticus: Secondary | ICD-10-CM | POA: Diagnosis present

## 2020-10-13 DIAGNOSIS — Z888 Allergy status to other drugs, medicaments and biological substances status: Secondary | ICD-10-CM | POA: Diagnosis not present

## 2020-10-13 DIAGNOSIS — E860 Dehydration: Secondary | ICD-10-CM | POA: Diagnosis present

## 2020-10-13 DIAGNOSIS — R4182 Altered mental status, unspecified: Secondary | ICD-10-CM | POA: Diagnosis not present

## 2020-10-13 LAB — BASIC METABOLIC PANEL
Anion gap: 7 (ref 5–15)
BUN: 19 mg/dL (ref 6–20)
CO2: 20 mmol/L — ABNORMAL LOW (ref 22–32)
Calcium: 8.7 mg/dL — ABNORMAL LOW (ref 8.9–10.3)
Chloride: 111 mmol/L (ref 98–111)
Creatinine, Ser: 1.24 mg/dL (ref 0.61–1.24)
GFR, Estimated: >60 mL/min (ref >60)
Glucose, Bld: 102 mg/dL — ABNORMAL HIGH (ref 70–99)
Potassium: 5.3 mmol/L — ABNORMAL HIGH (ref 3.5–5.1)
Sodium: 138 mmol/L (ref 135–145)

## 2020-10-13 LAB — CBC
HCT: 36.3 % — ABNORMAL LOW (ref 39.0–52.0)
Hemoglobin: 12 g/dL — ABNORMAL LOW (ref 13.0–17.0)
MCH: 27.6 pg (ref 26.0–34.0)
MCHC: 33.1 g/dL (ref 30.0–36.0)
MCV: 83.6 fL (ref 80.0–100.0)
Platelets: 202 10*3/uL (ref 150–400)
RBC: 4.34 MIL/uL (ref 4.22–5.81)
RDW: 14.3 % (ref 11.5–15.5)
WBC: 9.2 10*3/uL (ref 4.0–10.5)
nRBC: 0 % (ref 0.0–0.2)

## 2020-10-13 LAB — GLUCOSE, CAPILLARY
Glucose-Capillary: 115 mg/dL — ABNORMAL HIGH (ref 70–99)
Glucose-Capillary: 153 mg/dL — ABNORMAL HIGH (ref 70–99)
Glucose-Capillary: 162 mg/dL — ABNORMAL HIGH (ref 70–99)
Glucose-Capillary: 156 mg/dL — ABNORMAL HIGH (ref 70–99)

## 2020-10-13 LAB — MAGNESIUM: Magnesium: 1.3 mg/dL — ABNORMAL LOW (ref 1.7–2.4)

## 2020-10-13 MED ORDER — MAGNESIUM OXIDE -MG SUPPLEMENT 400 (240 MG) MG PO TABS
400.0000 mg | ORAL_TABLET | Freq: Two times a day (BID) | ORAL | Status: DC
  Administered 2020-10-13 – 2020-10-15 (×5): 400 mg via ORAL
  Filled 2020-10-13 (×6): qty 1

## 2020-10-13 NOTE — Progress Notes (Signed)
PROGRESS NOTE  Ronald Roman ZTI:[id number] DOB: [birth year] DOA: 10/11/2020 PCP: System, Provider Not In   LOS: 0 days   Brief narrative:  Ronald Roman is a 60 y.o. male with past medical history significant of schizophrenia, IDDM, HTN, HLD, kidney stones, OSA, came with altered mental status.  Patient was found to be unresponsive in the chair and discharged him with pillboxes that belonged to other residents from group home.  He was given 1 dose of Narcan and patient started waking up.  Initially blood pressure was low in the 780s which improved with bolus of IV fluids.  In the ED patient was arousable but still sleepy and was placed in observation hospital.  CT head scan was negative.  Creatinine was elevated at 2.2..  Renal ultrasound was negative for obstruction.  Patient was then placed in observation in the hospital.  Assessment/Plan:  Active Problems:   AMS (altered mental status)   Acute metabolic encephalopathy  Acute toxic encephalopathy Suspected polypharmacy narcotic overdose.  Received Narcan.  Patient received sedation, one-to-one sitter and restraints yesterday for agitation.  Calmer today.  Psychiatry was consulted for history of schizophrenia.  Latest POC glucose was 133..  Off long-acting insulin and continue sliding scale insulin for now due to previous history of hypoglycemia.  On diabetic diet.  Hemoglobin A1c at 5.2.  Check valproate and clozapine level in a.m.  Acute kidney injury. Normalized with IV fluids..  Renal ultrasound was negative.  Uric acid was 6.4.  Will discontinue IV fluid today.  Schizophrenia Has been seen by psychiatry psychiatry as recommended lipid panel and closteril level, valproate level tomorrow.  Essential hypertension. ACE inhibitor is on hold.  Hyperkalemia.  Continue hydralazine.  Closely monitor blood pressure.  Diabetes mellitus type II.  On long-acting insulin.  Currently on hold.  We will continue with sliding scale insulin  diabetic diet.  Hypomagnesemia.  Will replace orally.  Check levels in a.m.  DVT prophylaxis: heparin injection 5,000 Units Start: 10/11/20 2200   Code Status: Full code  Family Communication: None  Status is: Inpatient  The patient is inpatient because: IV treatments appropriate due to intensity of illness or inability to take PO and Inpatient level of care appropriate due to severity of illness  Dispo: The patient is from: Group home              Anticipated d/c is to: Group home              Patient currently is not medically stable to d/c.   Difficult to place patient No   Consultants: Psychiatry  Procedures:  None  Anti-infectives:  . None  Anti-infectives (From admission, onward)   None     Subjective: Today, patient was seen and examined at bedside.  Appears to be much calmer today eating his breakfast.  Denies any pain, nausea, vomiting fever or chills.   Objective: Vitals:   10/13/20 0834 10/13/20 1141  BP: (!) 152/81 (!) 103/91  Pulse: 80 97  Resp:  16  Temp: 98.5 F (36.9 C) 98.9 F (37.2 C)  SpO2: 100% 100%    Intake/Output Summary (Last 24 hours) at 10/13/2020 1431 Last data filed at 10/13/2020 1200 Gross per 24 hour  Intake 2741.55 ml  Output 650 ml  Net 2091.55 ml   There were no vitals filed for this visit. There is no height or weight on file to calculate BMI.   Physical Exam: GENERAL: Patient is alert awake and communicative, calmer today.  Not in obvious distress. HENT: No scleral pallor or icterus. Pupils equally reactive to light. Oral mucosa is moist NECK: is supple, no gross swelling noted. CHEST: Clear to auscultation. No crackles or wheezes.  Diminished breath sounds bilaterally. CVS: S1 and S2 heard, no murmur. Regular rate and rhythm.  ABDOMEN: Soft, non-tender, bowel sounds are present. EXTREMITIES: No edema. CNS: Cranial nerves are intact. No focal motor deficits. SKIN: warm and dry without rashes.  Data Review: I have  personally reviewed the following laboratory data and studies,  CBC: Recent Labs  Lab 10/09/20 1138 10/11/20 1335 10/12/20 0117 10/13/20 0544  WBC 9.6 10.8* 11.2* 9.2  NEUTROABS  --  7.9*  --   --   HGB 12.8* 11.2* 11.8* 12.0*  HCT 40.2 35.8* 36.8* 36.3*  MCV 86.5 87.3 86.8 83.6  PLT 210 202 195 202   Basic Metabolic Panel: Recent Labs  Lab 10/09/20 1138 10/11/20 1335 10/12/20 0117 10/13/20 0500  NA 138 140 141 138  K 4.6 4.7 4.7 5.3*  CL 108 109 110 111  CO2 22 22 23  20*  GLUCOSE 67* 104* 85 102*  BUN 29* 37* 31* 19  CREATININE 1.65* 2.48* 1.52* 1.24  CALCIUM 9.5 8.9 8.7* 8.7*  MG  --   --   --  1.3*   Liver Function Tests: Recent Labs  Lab 10/09/20 1138 10/11/20 1335  AST 24 22  ALT 17 16  ALKPHOS 62 56  BILITOT 0.5 0.6  PROT 7.0 5.8*  ALBUMIN 3.6 3.2*   No results for input(s): LIPASE, AMYLASE in the last 168 hours. Recent Labs  Lab 10/11/20 1618  AMMONIA 9   Cardiac Enzymes: No results for input(s): CKTOTAL, CKMB, CKMBINDEX, TROPONINI in the last 168 hours. BNP (last 3 results) Recent Labs    05/07/20 1028  BNP 54.5    ProBNP (last 3 results) No results for input(s): PROBNP in the last 8760 hours.  CBG: Recent Labs  Lab 10/12/20 1300 10/12/20 1626 10/12/20 2228 10/13/20 0633 10/13/20 1241  GLUCAP 104* 87 241* 115* 153*   Recent Results (from the past 240 hour(s))  Resp Panel by RT-PCR (Flu A&B, Covid) Nasopharyngeal Swab     Status: None   Collection Time: 10/11/20  4:18 PM   Specimen: Nasopharyngeal Swab; Nasopharyngeal(NP) swabs in vial transport medium  Result Value Ref Range Status   SARS Coronavirus 2 by RT PCR NEGATIVE NEGATIVE Final    Comment: (NOTE) SARS-CoV-2 target nucleic acids are NOT DETECTED.  The SARS-CoV-2 RNA is generally detectable in upper respiratory specimens during the acute phase of infection. The lowest concentration of SARS-CoV-2 viral copies this assay can detect is 138 copies/mL. A negative result  does not preclude SARS-Cov-2 infection and should not be used as the sole basis for treatment or other patient management decisions. A negative result may occur with  improper specimen collection/handling, submission of specimen other than nasopharyngeal swab, presence of viral mutation(s) within the areas targeted by this assay, and inadequate number of viral copies(<138 copies/mL). A negative result must be combined with clinical observations, patient history, and epidemiological information. The expected result is Negative.  Fact Sheet for Patients:  10/13/20  Fact Sheet for Healthcare Providers:  BloggerCourse.com  This test is no t yet approved or cleared by the [employer] FDA and  has been authorized for detection and/or diagnosis of SARS-CoV-2 by FDA under an Emergency Use Authorization (EUA). This EUA will remain  in effect (meaning this test can be used) for the  duration of the COVID-19 declaration under Section 564(b)(1) of the Act, 21 U.S.C.section 360bbb-3(b)(1), unless the authorization is terminated  or revoked sooner.       Influenza A by PCR NEGATIVE NEGATIVE Final   Influenza B by PCR NEGATIVE NEGATIVE Final    Comment: (NOTE) The Xpert Xpress SARS-CoV-2/FLU/RSV plus assay is intended as an aid in the diagnosis of influenza from Nasopharyngeal swab specimens and should not be used as a sole basis for treatment. Nasal washings and aspirates are unacceptable for Xpert Xpress SARS-CoV-2/FLU/RSV testing.  Fact Sheet for Patients: BloggerCourse.com  Fact Sheet for Healthcare Providers: [employer]it  This test is not yet approved or cleared by the [place] FDA and has been authorized for detection and/or diagnosis of SARS-CoV-2 by FDA under an Emergency Use Authorization (EUA). This EUA will remain in effect (meaning this test can be used) for  the duration of the COVID-19 declaration under Section 564(b)(1) of the Act, 21 U.S.C. section 360bbb-3(b)(1), unless the authorization is terminated or revoked.  Performed at [facility], [address], [place], [name] [address]      Studies: US Renal  Result Date: 10/11/2020 CLINICAL DATA:  Renal failure EXAM: RENAL / URINARY TRACT ULTRASOUND COMPLETE COMPARISON:  None. FINDINGS: Right Kidney: Renal measurements: 10.0 x 5.1 x 4.8 cm = volume: 128 mL. Echogenicity within normal limits. No mass or hydronephrosis visualized. Left Kidney: Renal measurements: 10.2 x 6.2 x 4.2 cm = volume: 138 mL. 8 normal echotexture. No hydronephrosis. 3.3 cm midpole cyst. Bladder: Appears normal for degree of bladder distention. Other: None. IMPRESSION: No acute findings.  No hydronephrosis. Electronically Signed   By: Charlett Nose M.D.   On: 10/11/2020 16:01      Joycelyn Das, MD  [facility] 10/13/2020  If 7PM-7AM, please contact night-coverage

## 2020-10-13 NOTE — Plan of Care (Signed)

## 2020-10-13 NOTE — Consult Note (Signed)
[facility]   Reason for Consult:  Schizophrenia, AMS, possible OD Referring Physician:  Joycelyn DasLaxman Pokhrel MD Patient Identification: Ronald AngstBarry D Kluver MRN:  [id number] Principal Diagnosis: <principal problem not specified> Diagnosis:  Active Problems:   AMS (altered mental status)   Total Time spent with patient: 30 minutes  Subjective:   Ronald Roman is a 60 y.o. male patient admitted with AMS possible OD. PMH significant for schizophrenia, IDDM, HTN, HLD, kidney stones, OSA.  Patient was found in the chair, unresponsive, beside him are pillboxes belongs to other residents from group home.  EMS called and 1 dose of Narcan given and patient waking up. It appears that the patient took other residents medications which according to group home record, narcotics.  Initially blood pressure was low 70s over 40s and improved with 1 bolus of 500 mL fluid to 96/63.  Patient arousable in the ED, still sleepy, no complaints of pain or shortness of breath or cough.  Patient does not remember what happened.  HPI:  He reports that after he was released from [facility] he returned to his group home. He reports that he was taking his medications as prescribed. When asked what happened to bring him back to the hospital he states he had a seizure.   When asked he reports no SI, HI, or AVH. He reports no thoughts of people out to get him. He reports no thoughts that others can hear his thoughts or put thoughts into his head. He reports no thoughts that the tv or radio have special messages for him. However, during the entire interview he continually looked around the room and was slow to answer questions.  Discussed needing to draw a Depakote level on Wednesday AM to see if he is in a therapeutic range given his reported Seizure. He had no other concerns at present.   Past Psychiatric History: Schizoaffective Disorder, TD, in a group home  Risk to Self:  No Risk to Others:   No Prior Inpatient Therapy:  Yes Prior Outpatient Therapy:  Yes, group home  Past Medical History:  Past Medical History:  Diagnosis Date  . Diabetes mellitus without complication (HCC)   . GERD (gastroesophageal reflux disease)   . Hyperlipidemia   . Hypertension   . Paranoid schizophrenia (HCC)   . Sleep apnea   . Uric acid nephrolithiasis    History reviewed. No pertinent surgical history. Family History: History reviewed. No pertinent family history. Family Psychiatric  History: Unable to answer Social History:  Social History   Substance and Sexual Activity  Alcohol Use No     Social History   Substance and Sexual Activity  Drug Use No    Social History   Socioeconomic History  . Marital status: Single    Spouse name: Not on file  . Number of children: Not on file  . Years of education: Not on file  . Highest education level: Not on file  Occupational History  . Not on file  Tobacco Use  . Smoking status: Current Every Day Smoker    Packs/day: 1.00    Types: Cigarettes  . Smokeless tobacco: Never Used  Substance and Sexual Activity  . Alcohol use: No  . Drug use: No  . Sexual activity: Not Currently  Other Topics Concern  . Not on file  Social History Narrative  . Not on file   Social Determinants of Health   Financial Resource Strain: Not on file  Food Insecurity: Not on file  Transportation Needs: Not on file  Physical Activity: Not on file  Stress: Not on file  Social Connections: Not on file   Additional Social History:    Allergies:   Allergies  Allergen Reactions  . Cogentin [Benztropine] Other (See Comments)    Per MAR   . Penicillins Other (See Comments)    Per MAR - unable to verify patients PCN reaction   . Prolixin [Fluphenazine] Other (See Comments)    Per MAR     Labs:  Results for orders placed or performed during the hospital encounter of 10/11/20 (from the past 48 hour(s))  Comprehensive metabolic panel     Status:  Abnormal   Collection Time: 10/11/20  1:35 PM  Result Value Ref Range   Sodium 140 135 - 145 mmol/L   Potassium 4.7 3.5 - 5.1 mmol/L   Chloride 109 98 - 111 mmol/L   CO2 22 22 - 32 mmol/L   Glucose, Bld 104 (H) 70 - 99 mg/dL    Comment: Glucose reference range applies only to samples taken after fasting for at least 8 hours.   BUN 37 (H) 6 - 20 mg/dL   Creatinine, Ser 5.80 (H) 0.61 - 1.24 mg/dL   Calcium 8.9 8.9 - 99.8 mg/dL   Total Protein 5.8 (L) 6.5 - 8.1 g/dL   Albumin 3.2 (L) 3.5 - 5.0 g/dL   AST 22 15 - 41 U/L   ALT 16 0 - 44 U/L   Alkaline Phosphatase 56 38 - 126 U/L   Total Bilirubin 0.6 0.3 - 1.2 mg/dL   GFR, Estimated 29 (L) >60 mL/min    Comment: (NOTE) Calculated using the CKD-EPI Creatinine Equation (2021)    Anion gap 9 5 - 15    Comment: Performed at [facility] Lab, [address], [place], [name] [address]  Ethanol     Status: None   Collection Time: 10/11/20  1:35 PM  Result Value Ref Range   Alcohol, Ethyl (B) <10 <10 mg/dL    Comment: (NOTE) Lowest detectable limit for serum alcohol is 10 mg/dL.  For medical purposes only. Performed at [facility] Lab, [address] [place], [name] [address]   CBC WITH DIFFERENTIAL     Status: Abnormal   Collection Time: 10/11/20  1:35 PM  Result Value Ref Range   WBC 10.8 (H) 4.0 - 10.5 K/uL   RBC 4.10 (L) 4.22 - 5.81 MIL/uL   Hemoglobin 11.2 (L) 13.0 - 17.0 g/dL   HCT 67.3 (L) 41.9 - 37.9 %   MCV 87.3 80.0 - 100.0 fL   MCH 27.3 26.0 - 34.0 pg   MCHC 31.3 30.0 - 36.0 g/dL   RDW 02.4 09.7 - 35.3 %   Platelets 202 150 - 400 K/uL   nRBC 0.0 0.0 - 0.2 %   Neutrophils Relative % 73 %   Neutro Abs 7.9 (H) 1.7 - 7.7 K/uL   Lymphocytes Relative 16 %   Lymphs Abs 1.8 0.7 - 4.0 K/uL   Monocytes Relative 10 %   Monocytes Absolute 1.1 (H) 0.1 - 1.0 K/uL   Eosinophils Relative 0 %   Eosinophils Absolute 0.0 0.0 - 0.5 K/uL   Basophils Relative 0 %   Basophils Absolute 0.0 0.0 - 0.1 K/uL   Immature  Granulocytes 1 %   Abs Immature Granulocytes 0.05 0.00 - 0.07 K/uL    Comment: Performed at [facility] Lab, [address], [place], [name] [address]  Protime-INR  Status: None   Collection Time: 10/11/20  1:35 PM  Result Value Ref Range   Prothrombin Time 14.9 11.4 - 15.2 seconds   INR 1.2 0.8 - 1.2    Comment: (NOTE) INR goal varies based on device and disease states. Performed at [facility] Lab, [address], [place], [name] [address]   CBG monitoring, ED     Status: None   Collection Time: 10/11/20  2:09 PM  Result Value Ref Range   Glucose-Capillary 93 70 - 99 mg/dL    Comment: Glucose reference range applies only to samples taken after fasting for at least 8 hours.  Urine rapid drug screen (hosp performed)not at [facility]     Status: None   Collection Time: 10/11/20  4:18 PM  Result Value Ref Range   Opiates NONE DETECTED NONE DETECTED   Cocaine NONE DETECTED NONE DETECTED   Benzodiazepines NONE DETECTED NONE DETECTED   Amphetamines NONE DETECTED NONE DETECTED   Tetrahydrocannabinol NONE DETECTED NONE DETECTED   Barbiturates NONE DETECTED NONE DETECTED    Comment: (NOTE) DRUG SCREEN FOR MEDICAL PURPOSES ONLY.  IF CONFIRMATION IS NEEDED FOR ANY PURPOSE, NOTIFY LAB WITHIN 5 DAYS.  LOWEST DETECTABLE LIMITS FOR URINE DRUG SCREEN Drug Class                     Cutoff (ng/mL) Amphetamine and metabolites    1000 Barbiturate and metabolites    200 Benzodiazepine                 200 Tricyclics and metabolites     300 Opiates and metabolites        300 Cocaine and metabolites        300 THC                            50 Performed at [facility] Lab, [address], [place], [name] [address]   Urinalysis, Complete w Microscopic Urine, Random     Status: Abnormal   Collection Time: 10/11/20  4:18 PM  Result Value Ref Range   Color, Urine STRAW (A) YELLOW   APPearance CLEAR CLEAR   Specific Gravity, Urine 1.005 1.005 - 1.030   pH 5.0 5.0 - 8.0   Glucose, UA  >=500 (A) NEGATIVE mg/dL   Hgb urine dipstick NEGATIVE NEGATIVE   Bilirubin Urine NEGATIVE NEGATIVE   Ketones, ur NEGATIVE NEGATIVE mg/dL   Protein, ur NEGATIVE NEGATIVE mg/dL   Nitrite NEGATIVE NEGATIVE   Leukocytes,Ua NEGATIVE NEGATIVE   RBC / HPF 0-5 0 - 5 RBC/hpf   WBC, UA 0-5 0 - 5 WBC/hpf   Bacteria, UA NONE SEEN NONE SEEN   Mucus PRESENT    Hyaline Casts, UA PRESENT     Comment: Performed at [facility] Lab, [address], [place], [name] [address]  Valproic acid level     Status: Abnormal   Collection Time: 10/11/20  4:18 PM  Result Value Ref Range   Valproic Acid Lvl 23 (L) 50.0 - 100.0 ug/mL    Comment: Performed at [facility] Lab, [address], [place], [name] [address]  Ammonia     Status: None   Collection Time: 10/11/20  4:18 PM  Result Value Ref Range   Ammonia 9 9 - 35 umol/L    Comment: Performed at [facility] Lab, [address], [place], [name] [address]  Resp Panel by RT-PCR (Flu A&B,  Covid) Nasopharyngeal Swab     Status: None   Collection Time: 10/11/20  4:18 PM   Specimen: Nasopharyngeal Swab; Nasopharyngeal(NP) swabs in vial transport medium  Result Value Ref Range   SARS Coronavirus 2 by RT PCR NEGATIVE NEGATIVE    Comment: (NOTE) SARS-CoV-2 target nucleic acids are NOT DETECTED.  The SARS-CoV-2 RNA is generally detectable in upper respiratory specimens during the acute phase of infection. The lowest concentration of SARS-CoV-2 viral copies this assay can detect is 138 copies/mL. A negative result does not preclude SARS-Cov-2 infection and should not be used as the sole basis for treatment or other patient management decisions. A negative result may occur with  improper specimen collection/handling, submission of specimen other than nasopharyngeal swab, presence of viral mutation(s) within the areas targeted by this assay, and inadequate number of viral copies(<138 copies/mL). A negative result must be combined with clinical  observations, patient history, and epidemiological information. The expected result is Negative.  Fact Sheet for Patients:  BloggerCourse.com  Fact Sheet for Healthcare Providers:  SeriousBroker.it  This test is no t yet approved or cleared by the [place] FDA and  has been authorized for detection and/or diagnosis of SARS-CoV-2 by FDA under an Emergency Use Authorization (EUA). This EUA will remain  in effect (meaning this test can be used) for the duration of the COVID-19 declaration under Section 564(b)(1) of the Act, 21 U.S.C.section 360bbb-3(b)(1), unless the authorization is terminated  or revoked sooner.       Influenza A by PCR NEGATIVE NEGATIVE   Influenza B by PCR NEGATIVE NEGATIVE    Comment: (NOTE) The Xpert Xpress SARS-CoV-2/FLU/RSV plus assay is intended as an aid in the diagnosis of influenza from Nasopharyngeal swab specimens and should not be used as a sole basis for treatment. Nasal washings and aspirates are unacceptable for Xpert Xpress SARS-CoV-2/FLU/RSV testing.  Fact Sheet for Patients: BloggerCourse.com  Fact Sheet for Healthcare Providers: SeriousBroker.it  This test is not yet approved or cleared by the [place] FDA and has been authorized for detection and/or diagnosis of SARS-CoV-2 by FDA under an Emergency Use Authorization (EUA). This EUA will remain in effect (meaning this test can be used) for the duration of the COVID-19 declaration under Section 564(b)(1) of the Act, 21 U.S.C. section 360bbb-3(b)(1), unless the authorization is terminated or revoked.  Performed at [facility], [address], [place], [name] [address]   Glucose, capillary     Status: Abnormal   Collection Time: 10/11/20 10:17 PM  Result Value Ref Range   Glucose-Capillary 115 (H) 70 - 99 mg/dL    Comment: Glucose reference range applies only to samples  taken after fasting for at least 8 hours.   Comment 1 Notify RN    Comment 2 Document in Chart   Blood gas, venous     Status: Abnormal   Collection Time: 10/11/20 11:04 PM  Result Value Ref Range   FIO2 100.00    pH, Ven 7.303 7.250 - 7.430   pCO2, Ven 47.3 44.0 - 60.0 mmHg   pO2, Ven <31.0 (LL) 32.0 - 45.0 mmHg    Comment: CRITICAL RESULT CALLED TO, READ BACK BY AND VERIFIED WITH: O.ORONSEYE,RN 0410 10/12/2020 M.CAMPBELL    Bicarbonate 22.7 20.0 - 28.0 mmol/L   Acid-base deficit 2.7 (H) 0.0 - 2.0 mmol/L   O2 Saturation 40.3 %   Patient temperature 37.0    Collection site ARM     Comment: RIGHT   Drawn by DRAWN BY  PHLEBOTOMY    Sample type VENOUS     Comment: Performed at [facility] Lab, [address], [place], [name] [address]  Hemoglobin A1c     Status: None   Collection Time: 10/11/20 11:08 PM  Result Value Ref Range   Hgb A1c MFr Bld 5.2 4.8 - 5.6 %    Comment: (NOTE) Pre diabetes:          5.7%-6.4%  Diabetes:              >6.4%  Glycemic control for   <7.0% adults with diabetes    Mean Plasma Glucose 102.54 mg/dL    Comment: Performed at [facility], [address], [place], [name] [address]  Uric acid     Status: None   Collection Time: 10/11/20 11:08 PM  Result Value Ref Range   Uric Acid, Serum 6.4 3.7 - 8.6 mg/dL    Comment: Performed at [facility], [address] [place], [name] [address]  Basic metabolic panel     Status: Abnormal   Collection Time: 10/12/20  1:17 AM  Result Value Ref Range   Sodium 141 135 - 145 mmol/L   Potassium 4.7 3.5 - 5.1 mmol/L   Chloride 110 98 - 111 mmol/L   CO2 23 22 - 32 mmol/L   Glucose, Bld 85 70 - 99 mg/dL    Comment: Glucose reference range applies only to samples taken after fasting for at least 8 hours.   BUN 31 (H) 6 - 20 mg/dL   Creatinine, Ser 3.26 (H) 0.61 - 1.24 mg/dL   Calcium 8.7 (L) 8.9 - 10.3 mg/dL   GFR, Estimated 52 (L) >60 mL/min    Comment: (NOTE) Calculated using the CKD-EPI  Creatinine Equation (2021)    Anion gap 8 5 - 15    Comment: Performed at [facility] Lab, [address] [place], [name] [address]  CBC     Status: Abnormal   Collection Time: 10/12/20  1:17 AM  Result Value Ref Range   WBC 11.2 (H) 4.0 - 10.5 K/uL   RBC 4.24 4.22 - 5.81 MIL/uL   Hemoglobin 11.8 (L) 13.0 - 17.0 g/dL   HCT 80.9 (L) 98.3 - 38.2 %   MCV 86.8 80.0 - 100.0 fL   MCH 27.8 26.0 - 34.0 pg   MCHC 32.1 30.0 - 36.0 g/dL   RDW 50.5 39.7 - 67.3 %   Platelets 195 150 - 400 K/uL   nRBC 0.0 0.0 - 0.2 %    Comment: Performed at [facility], [address] [place], [name] [address]  Glucose, capillary     Status: None   Collection Time: 10/12/20  6:30 AM  Result Value Ref Range   Glucose-Capillary 84 70 - 99 mg/dL    Comment: Glucose reference range applies only to samples taken after fasting for at least 8 hours.   Comment 1 Notify RN    Comment 2 Document in Chart   Glucose, capillary     Status: Abnormal   Collection Time: 10/12/20  1:00 PM  Result Value Ref Range   Glucose-Capillary 104 (H) 70 - 99 mg/dL    Comment: Glucose reference range applies only to samples taken after fasting for at least 8 hours.   Comment 1 Notify RN    Comment 2 Document in Chart   Glucose, capillary     Status: None   Collection Time: 10/12/20  4:26 PM  Result  Value Ref Range   Glucose-Capillary 87 70 - 99 mg/dL    Comment: Glucose reference range applies only to samples taken after fasting for at least 8 hours.   Comment 1 Notify RN    Comment 2 Document in Chart   Glucose, capillary     Status: Abnormal   Collection Time: 10/12/20 10:28 PM  Result Value Ref Range   Glucose-Capillary 241 (H) 70 - 99 mg/dL    Comment: Glucose reference range applies only to samples taken after fasting for at least 8 hours.   Comment 1 Notify RN   Basic metabolic panel     Status: Abnormal   Collection Time: 10/13/20  5:00 AM  Result Value Ref Range   Sodium 138 135 - 145 mmol/L   Potassium  5.3 (H) 3.5 - 5.1 mmol/L    Comment: SLIGHT HEMOLYSIS   Chloride 111 98 - 111 mmol/L   CO2 20 (L) 22 - 32 mmol/L   Glucose, Bld 102 (H) 70 - 99 mg/dL    Comment: Glucose reference range applies only to samples taken after fasting for at least 8 hours.   BUN 19 6 - 20 mg/dL   Creatinine, Ser 4.01 0.61 - 1.24 mg/dL   Calcium 8.7 (L) 8.9 - 10.3 mg/dL   GFR, Estimated >02 >72 mL/min    Comment: (NOTE) Calculated using the CKD-EPI Creatinine Equation (2021)    Anion gap 7 5 - 15    Comment: Performed at [facility], [address] [place], [name] [address]  Magnesium     Status: Abnormal   Collection Time: 10/13/20  5:00 AM  Result Value Ref Range   Magnesium 1.3 (L) 1.7 - 2.4 mg/dL    Comment: Performed at [facility], [address] [place], [name] [address]  CBC     Status: Abnormal   Collection Time: 10/13/20  5:44 AM  Result Value Ref Range   WBC 9.2 4.0 - 10.5 K/uL   RBC 4.34 4.22 - 5.81 MIL/uL   Hemoglobin 12.0 (L) 13.0 - 17.0 g/dL   HCT 42.5 (L) 95.6 - 38.7 %   MCV 83.6 80.0 - 100.0 fL   MCH 27.6 26.0 - 34.0 pg   MCHC 33.1 30.0 - 36.0 g/dL   RDW 56.4 33.2 - 95.1 %   Platelets 202 150 - 400 K/uL   nRBC 0.0 0.0 - 0.2 %    Comment: Performed at [facility], [address] [place], [name] [address]  Glucose, capillary     Status: Abnormal   Collection Time: 10/13/20  6:33 AM  Result Value Ref Range   Glucose-Capillary 115 (H) 70 - 99 mg/dL    Comment: Glucose reference range applies only to samples taken after fasting for at least 8 hours.    Current Facility-Administered Medications  Medication Dose Route Frequency Provider Last Rate Last Admin  . 0.9 %  sodium chloride infusion   Intravenous Continuous Gerhard Munch, MD 125 mL/hr at 10/13/20 0800 Infusion Verify at 10/13/20 0800  . acetaminophen (TYLENOL) tablet 650 mg  650 mg Oral Q6H PRN Mikey College T, MD       Or  . acetaminophen (TYLENOL) suppository 650 mg  650 mg Rectal Q6H PRN Mikey College T, MD      . albuterol (PROVENTIL) (2.5 MG/3ML) 0.083% nebulizer solution 2.5 mg  2.5 mg Inhalation Q4H PRN Mikey College T, MD      . amantadine (SYMMETREL) capsule 100  mg  100 mg Oral BID Mikey College T, MD   100 mg at 10/12/20 2114  . cloZAPine (CLOZARIL) tablet 150 mg  150 mg Oral QPC breakfast Mikey College T, MD   150 mg at 10/12/20 5638  . divalproex (DEPAKOTE) DR tablet 125 mg  125 mg Oral q AM Emeline General, MD   125 mg at 10/13/20 [phone]  . divalproex (DEPAKOTE) DR tablet 250 mg  250 mg Oral BID Mikey College T, MD   250 mg at 10/12/20 2114  . guaiFENesin (MUCINEX) 12 hr tablet 1,200 mg  1,200 mg Oral BID Mikey College T, MD   1,200 mg at 10/12/20 2114  . heparin injection 5,000 Units  5,000 Units Subcutaneous Q12H Mikey College T, MD   5,000 Units at 10/12/20 2114  . hydrALAZINE (APRESOLINE) tablet 25 mg  25 mg Oral Q6H PRN Mikey College T, MD      . hydrOXYzine (ATARAX/VISTARIL) tablet 25 mg  25 mg Oral QID Mikey College T, MD   25 mg at 10/12/20 2114  . insulin aspart (novoLOG) injection 0-9 Units  0-9 Units Subcutaneous TID WC Mikey College T, MD      . ondansetron [facility]) tablet 4 mg  4 mg Oral Q6H PRN Mikey College T, MD       Or  . ondansetron [facility]) injection 4 mg  4 mg Intravenous Q6H PRN Mikey College T, MD      . QUEtiapine (SEROQUEL) tablet 100 mg  100 mg Oral BID Mikey College T, MD   100 mg at 10/12/20 2115  . QUEtiapine (SEROQUEL) tablet 50 mg  50 mg Oral Q1200 Mikey College T, MD   50 mg at 10/12/20 1341  . traZODone (DESYREL) tablet 50-100 mg  50-100 mg Oral QHS Mikey College T, MD   100 mg at 10/12/20 2114  . zolpidem (AMBIEN) tablet 5 mg  5 mg Oral QHS Emeline General, MD   5 mg at 10/12/20 2115    Musculoskeletal: Strength & Muscle Tone: within normal limits Gait & Station: in bed during assesssment Patient leans: N/A            Psychiatric Specialty Exam:  Presentation  General Appearance: Appropriate for Environment  Eye Contact:None  Speech:Clear and Coherent;  Slow  Speech Volume:Normal  Handedness:No data recorded  Mood and Affect  Mood:-- (ok)  Affect:Appropriate   Thought Process  Thought Processes:Linear  Descriptions of Associations:Intact  Orientation:Full (Time, Place and Person)  Thought Content:Logical  History of Schizophrenia/Schizoaffective disorder:Yes  Duration of Psychotic Symptoms:Greater than six months  Hallucinations:Hallucinations: -- (Reports none but appears to be responding to internal stimuli)  Ideas of Reference:-- (Reports none but constant looking around the room)  Suicidal Thoughts:Suicidal Thoughts: No  Homicidal Thoughts:Homicidal Thoughts: No   Sensorium  Memory:Immediate Fair; Recent Fair  Judgment:Other (comment) (appears fair but difficult to assess due to short answers given)  Insight:Other (comment) (appears fair but difficult to assess due to short answers given)   Executive Functions  Concentration:Fair  Attention Span:Fair  Recall:Fair  [employer] of Knowledge:Fair  Language:Fair   Psychomotor Activity  Psychomotor Activity:Psychomotor Activity: Restlessness   Assets  Assets:Housing; Social Support; Resilience   Sleep  Sleep:Sleep: Fair   Physical Exam: Physical Exam Vitals and nursing note reviewed.  Constitutional:      General: He is not in acute distress.    Appearance: Normal appearance.  HENT:     Head: Normocephalic and atraumatic.  Cardiovascular:     Rate  and Rhythm: Normal rate.  Pulmonary:     Effort: Pulmonary effort is normal.  Musculoskeletal:        General: Normal range of motion.  Neurological:     Mental Status: He is alert.    Review of Systems  Constitutional: Negative for chills and fever.  Respiratory: Negative for shortness of breath.   Cardiovascular: Negative for chest pain.  Gastrointestinal: Negative for abdominal pain and vomiting.  Neurological: Negative for headaches.   Blood pressure (!) 152/81, pulse 80, temperature  98.5 F (36.9 C), temperature source Oral, resp. rate 19, SpO2 100 %. There is no height or weight on file to calculate BMI.  Treatment Plan Summary: Case discussed with Dr. Lucianne Muss Daily contact with patient to assess and evaluate symptoms and progress in treatment   Will get lipid panel and Clozaril level tomorrow AM and Valproic Level Wednesday AM. Given the multiple medications he is on and the unclear picture of how much he was taking will not make any changes at this time. Though he reports no AVH his poor eye contact and constant scanning of the room and delay in answering questions suggests he is responding to internal stimuli. Will continue to monitor.   -Obtain Lipid panel and Clozaril level tomorrow AM -Obtain Valproic Acid level Wednesday AM -Continue Depakote ER 125 mg AM and 250 mg BID -Continue Clozaril 150 mg AM -Continue Ambien 5 mg daily -Continue Seroquel 100 mg BID and 50 mg at lunch    Disposition: No evidence of imminent risk to self or others at present.    Lauro Franklin, MD 10/13/2020 9:04 AM

## 2020-10-14 DIAGNOSIS — I10 Essential (primary) hypertension: Secondary | ICD-10-CM | POA: Diagnosis not present

## 2020-10-14 DIAGNOSIS — N179 Acute kidney failure, unspecified: Secondary | ICD-10-CM | POA: Diagnosis not present

## 2020-10-14 DIAGNOSIS — E119 Type 2 diabetes mellitus without complications: Secondary | ICD-10-CM | POA: Diagnosis not present

## 2020-10-14 DIAGNOSIS — G40909 Epilepsy, unspecified, not intractable, without status epilepticus: Secondary | ICD-10-CM

## 2020-10-14 DIAGNOSIS — R4182 Altered mental status, unspecified: Secondary | ICD-10-CM | POA: Diagnosis not present

## 2020-10-14 DIAGNOSIS — F200 Paranoid schizophrenia: Secondary | ICD-10-CM

## 2020-10-14 LAB — CBC
HCT: 37.3 % — ABNORMAL LOW (ref 39.0–52.0)
Hemoglobin: 12.4 g/dL — ABNORMAL LOW (ref 13.0–17.0)
MCH: 27.7 pg (ref 26.0–34.0)
MCHC: 33.2 g/dL (ref 30.0–36.0)
MCV: 83.4 fL (ref 80.0–100.0)
Platelets: 202 10*3/uL (ref 150–400)
RBC: 4.47 MIL/uL (ref 4.22–5.81)
RDW: 14.3 % (ref 11.5–15.5)
WBC: 9.1 10*3/uL (ref 4.0–10.5)
nRBC: 0 % (ref 0.0–0.2)

## 2020-10-14 LAB — GLUCOSE, CAPILLARY
Glucose-Capillary: 105 mg/dL — ABNORMAL HIGH (ref 70–99)
Glucose-Capillary: 157 mg/dL — ABNORMAL HIGH (ref 70–99)
Glucose-Capillary: 156 mg/dL — ABNORMAL HIGH (ref 70–99)
Glucose-Capillary: 122 mg/dL — ABNORMAL HIGH (ref 70–99)

## 2020-10-14 LAB — BASIC METABOLIC PANEL
Anion gap: 8 (ref 5–15)
BUN: 17 mg/dL (ref 6–20)
CO2: 21 mmol/L — ABNORMAL LOW (ref 22–32)
Calcium: 9.1 mg/dL (ref 8.9–10.3)
Chloride: 108 mmol/L (ref 98–111)
Creatinine, Ser: 1.13 mg/dL (ref 0.61–1.24)
GFR, Estimated: >60 mL/min (ref >60)
Glucose, Bld: 140 mg/dL — ABNORMAL HIGH (ref 70–99)
Potassium: 4.6 mmol/L (ref 3.5–5.1)
Sodium: 137 mmol/L (ref 135–145)

## 2020-10-14 LAB — LIPID PANEL
Cholesterol: 161 mg/dL (ref 0–200)
HDL: 44 mg/dL (ref >40)
LDL Cholesterol: 89 mg/dL (ref 0–99)
Total CHOL/HDL Ratio: 3.7 RATIO
Triglycerides: 141 mg/dL (ref <150)
VLDL: 28 mg/dL (ref 0–40)

## 2020-10-14 LAB — PHOSPHORUS: Phosphorus: 4.2 mg/dL (ref 2.5–4.6)

## 2020-10-14 LAB — MAGNESIUM: Magnesium: 1.4 mg/dL — ABNORMAL LOW (ref 1.7–2.4)

## 2020-10-14 MED ORDER — MAGNESIUM OXIDE -MG SUPPLEMENT 400 (240 MG) MG PO TABS: 400.0000 mg | ORAL_TABLET | Freq: Every day | ORAL | 0 refills | Status: DC

## 2020-10-14 NOTE — Discharge Summary (Signed)
Physician Discharge Summary  Ronald Roman FAO:[id number] DOB: [birth year] DOA: 10/11/2020  PCP: System, Provider Not In  Admit date: 10/11/2020 Discharge date: 10/14/2020  Admitted From: Home  Discharge disposition: Group home  Recommendations for Outpatient Follow-Up:   . Follow up with your primary care provider in one week.  . Continue to take all medications from home.  Discharge Diagnosis:   Principal Problem:   AMS (altered mental status) Active Problems:   Diabetes mellitus without complication (HCC)   Hypertension   AKI (acute kidney injury) (HCC)   Seizure disorder (HCC)   Schizophrenia (HCC)   Discharge Condition: Improved.  Diet recommendation: Regular  Wound care: None.  Code status: Full.   History of Present Illness:   Ronald Roman a 60 y.o.malewith past medical history significant ofschizophrenia,cellulitis, hypertension, hyperlipidemia, kidney stones,OSA,came with altered mental status.  Patient was found to be unresponsive in the chair and discharged him with pillboxes that belonged to other residents from group home.  He was given 1 dose of Narcan and patient started waking up.  Initially blood pressure was low in the 780s which improved with bolus of IV fluids.  In the ED, patient was arousable but still sleepy and was placed in observation hospital.  CT head scan was negative.  Creatinine was elevated at 2.2..  Renal ultrasound was negative for obstruction.  Patient was then placed in observation in the hospital.   Hospital Course:   Following conditions were addressed during hospitalization as listed below,  Acutetoxic encephalopathy Suspected polypharmacy, narcotic overdose.  Received Narcan.  Patient received sedation, one-to-one sitter and restraints subsequently.  Psychiatry was consulted due to history of schizophrenia.  Patient subsequently improved.   At this time patient has been cleared by psychiatry for disposition back to  group home.    Acute kidney injury. Normalized with IV fluids..  Renal ultrasound was negative.  Uric acid was 6.4.    Schizophrenia Has been seen by psychiatry psychiatry as recommended lipid panel and closteril level, valproate level.  At this time patient has been recommended same dose of medication on discharge.  History of seizure disorder.  Continue valproate.  Essential hypertension. Resume home medications on discharge.  Diabetes mellitus type II.  On long-acting insulin, diabetic diet.  Hypomagnesemia.    Continue oral replacement.  Disposition.  At this time, patient is stable for disposition to group home.  Medical Consultants:    Psychiatry  Procedures:    None Subjective:   Today, patient was seen and examined at bedside.  Denies any headache, dizziness, lightheadedness, seizures  Discharge Exam:   Vitals:   10/14/20 0340 10/14/20 0803  BP: 116/82 (!) 126/91  Pulse: 93 89  Resp: 19   Temp: 98.8 F (37.1 C) 98.6 F (37 C)  SpO2: 100% 100%   Vitals:   10/13/20 1953 10/14/20 0034 10/14/20 0340 10/14/20 0803  BP: (!) 137/111 (!) 127/92 116/82 (!) 126/91  Pulse: 93 98 93 89  Resp: 19 17 19    Temp: 97.6 F (36.4 C) 98.1 F (36.7 C) 98.8 F (37.1 C) 98.6 F (37 C)  TempSrc: Oral Oral Oral Oral  SpO2: 100% 97% 100% 100%   General: Alert awake, not in obvious distress, calm, HENT: pupils equally reacting to light,  No scleral pallor or icterus noted. Oral mucosa is moist.  Chest:  Clear breath sounds.  Diminished breath sounds bilaterally. No crackles or wheezes.  CVS: S1 &S2 heard. No murmur.  Regular rate and rhythm. Abdomen:  Soft, nontender, nondistended.  Bowel sounds are heard.   Extremities: No cyanosis, clubbing or edema.  Peripheral pulses are palpable. Psych: Alert, awake and communicative, CNS:  No cranial nerve deficits.  Power equal in all extremities.   Skin: Warm and dry.  No rashes noted.  The results of significant  diagnostics from this hospitalization (including imaging, microbiology, ancillary and laboratory) are listed below for reference.     Diagnostic Studies:   CT HEAD WO CONTRAST  Result Date: 10/11/2020 CLINICAL DATA:  Mental status change. EXAM: CT HEAD WITHOUT CONTRAST TECHNIQUE: Contiguous axial images were obtained from the base of the skull through the vertex without intravenous contrast. COMPARISON:  Oct 05, 2020 FINDINGS: Brain: No evidence of acute infarction, hemorrhage, hydrocephalus, extra-axial collection or mass lesion/mass effect. Vascular: Calcified atherosclerosis in the intracranial carotids. Skull: Normal. Negative for fracture or focal lesion. Sinuses/Orbits: No acute finding. Other: None. IMPRESSION: No acute abnormalities identified. Electronically Signed   By: Gerome Samavid  Williams III M.D   On: 10/11/2020 14:16   US Renal  Result Date: 10/11/2020 CLINICAL DATA:  Renal failure EXAM: RENAL / URINARY TRACT ULTRASOUND COMPLETE COMPARISON:  None. FINDINGS: Right Kidney: Renal measurements: 10.0 x 5.1 x 4.8 cm = volume: 128 mL. Echogenicity within normal limits. No mass or hydronephrosis visualized. Left Kidney: Renal measurements: 10.2 x 6.2 x 4.2 cm = volume: 138 mL. 8 normal echotexture. No hydronephrosis. 3.3 cm midpole cyst. Bladder: Appears normal for degree of bladder distention. Other: None. IMPRESSION: No acute findings.  No hydronephrosis. Electronically Signed   By: Charlett NoseKevin  Dover M.D.   On: 10/11/2020 16:01   DG Chest Port 1 View  Result Date: 10/11/2020 CLINICAL DATA:  Unresponsive. EXAM: PORTABLE CHEST 1 VIEW COMPARISON:  None. FINDINGS: The cardiomediastinal silhouette is unremarkable. Increased MEDIAL RIGHT LOWER lung opacity noted. No consolidation, pleural effusion, pneumothorax or acute bony abnormality noted. IMPRESSION: Increased MEDIAL RIGHT LOWER lung opacity which may represent atelectasis or airspace disease/pneumonia. Electronically Signed   By: Harmon PierJeffrey  Hu M.D.   On:  10/11/2020 14:27     Labs:   Basic Metabolic Panel: Recent Labs  Lab 10/09/20 1138 10/11/20 1335 10/12/20 0117 10/13/20 0500 10/14/20 0206  NA 138 140 141 138 137  K 4.6 4.7 4.7 5.3* 4.6  CL 108 109 110 111 108  CO2 22 22 23  20* 21*  GLUCOSE 67* 104* 85 102* 140*  BUN 29* 37* 31* 19 17  CREATININE 1.65* 2.48* 1.52* 1.24 1.13  CALCIUM 9.5 8.9 8.7* 8.7* 9.1  MG  --   --   --  1.3* 1.4*  PHOS  --   --   --   --  4.2   GFR Estimated Creatinine Clearance: 77.3 mL/min (by C-G formula based on SCr of 1.13 mg/dL). Liver Function Tests: Recent Labs  Lab 10/09/20 1138 10/11/20 1335  AST 24 22  ALT 17 16  ALKPHOS 62 56  BILITOT 0.5 0.6  PROT 7.0 5.8*  ALBUMIN 3.6 3.2*   No results for input(s): LIPASE, AMYLASE in the last 168 hours. Recent Labs  Lab 10/11/20 1618  AMMONIA 9   Coagulation profile Recent Labs  Lab 10/11/20 1335  INR 1.2    CBC: Recent Labs  Lab 10/09/20 1138 10/11/20 1335 10/12/20 0117 10/13/20 0544 10/14/20 0206  WBC 9.6 10.8* 11.2* 9.2 9.1  NEUTROABS  --  7.9*  --   --   --   HGB 12.8* 11.2* 11.8* 12.0* 12.4*  HCT 40.2 35.8* 36.8* 36.3* 37.3*  MCV 86.5 87.3 86.8 83.6 83.4  PLT 210 202 195 202 202   Cardiac Enzymes: No results for input(s): CKTOTAL, CKMB, CKMBINDEX, TROPONINI in the last 168 hours. BNP: Invalid input(s): POCBNP CBG: Recent Labs  Lab 10/13/20 0633 10/13/20 1241 10/13/20 1633 10/13/20 2108 10/14/20 0629  GLUCAP 115* 153* 162* 156* 105*   D-Dimer No results for input(s): DDIMER in the last 72 hours. Hgb A1c Recent Labs    10/11/20 2308  HGBA1C 5.2   Lipid Profile Recent Labs    10/14/20 0206  CHOL 161  HDL 44  LDLCALC 89  TRIG 141  CHOLHDL 3.7   Thyroid function studies No results for input(s): TSH, T4TOTAL, T3FREE, THYROIDAB in the last 72 hours.  Invalid input(s): FREET3 Anemia work up No results for input(s): VITAMINB12, FOLATE, FERRITIN, TIBC, IRON, RETICCTPCT in the last 72  hours. Microbiology Recent Results (from the past 240 hour(s))  Resp Panel by RT-PCR (Flu A&B, Covid) Nasopharyngeal Swab     Status: None   Collection Time: 10/11/20  4:18 PM   Specimen: Nasopharyngeal Swab; Nasopharyngeal(NP) swabs in vial transport medium  Result Value Ref Range Status   SARS Coronavirus 2 by RT PCR NEGATIVE NEGATIVE Final    Comment: (NOTE) SARS-CoV-2 target nucleic acids are NOT DETECTED.  The SARS-CoV-2 RNA is generally detectable in upper respiratory specimens during the acute phase of infection. The lowest concentration of SARS-CoV-2 viral copies this assay can detect is 138 copies/mL. A negative result does not preclude SARS-Cov-2 infection and should not be used as the sole basis for treatment or other patient management decisions. A negative result may occur with  improper specimen collection/handling, submission of specimen other than nasopharyngeal swab, presence of viral mutation(s) within the areas targeted by this assay, and inadequate number of viral copies(<138 copies/mL). A negative result must be combined with clinical observations, patient history, and epidemiological information. The expected result is Negative.  Fact Sheet for Patients:  BloggerCourse.com  Fact Sheet for Healthcare Providers:  SeriousBroker.it  This test is no t yet approved or cleared by the [place] FDA and  has been authorized for detection and/or diagnosis of SARS-CoV-2 by FDA under an Emergency Use Authorization (EUA). This EUA will remain  in effect (meaning this test can be used) for the duration of the COVID-19 declaration under Section 564(b)(1) of the Act, 21 U.S.C.section 360bbb-3(b)(1), unless the authorization is terminated  or revoked sooner.       Influenza A by PCR NEGATIVE NEGATIVE Final   Influenza B by PCR NEGATIVE NEGATIVE Final    Comment: (NOTE) The Xpert Xpress SARS-CoV-2/FLU/RSV plus assay  is intended as an aid in the diagnosis of influenza from Nasopharyngeal swab specimens and should not be used as a sole basis for treatment. Nasal washings and aspirates are unacceptable for Xpert Xpress SARS-CoV-2/FLU/RSV testing.  Fact Sheet for Patients: BloggerCourse.com  Fact Sheet for Healthcare Providers: SeriousBroker.it  This test is not yet approved or cleared by the [place] FDA and has been authorized for detection and/or diagnosis of SARS-CoV-2 by FDA under an Emergency Use Authorization (EUA). This EUA will remain in effect (meaning this test can be used) for the duration of the COVID-19 declaration under Section 564(b)(1) of the Act, 21 U.S.C. section 360bbb-3(b)(1), unless the authorization is terminated or revoked.  Performed at [facility], [address], [place], [name] [address]      Discharge Instructions:   Discharge Instructions    Diet - low sodium heart  healthy   Complete by: As directed    Increase activity slowly   Complete by: As directed      Allergies as of 10/14/2020      Reactions   Cogentin [benztropine] Other (See Comments)   Per MAR    Penicillins Other (See Comments)   Per MAR - unable to verify patients PCN reaction    Prolixin [fluphenazine] Other (See Comments)   Per MAR       Medication List    TAKE these medications   albuterol 108 (90 Base) MCG/ACT inhaler Commonly known as: VENTOLIN HFA Inhale 2 puffs into the lungs every 4 (four) hours as needed for wheezing or shortness of breath.   amantadine 100 MG capsule Commonly known as: SYMMETREL Take 1 capsule (100 mg total) by mouth 2 (two) times daily.   Clozapine 150 MG Tbdp Take 150 mg by mouth daily after breakfast.   dapagliflozin propanediol 10 MG Tabs tablet Commonly known as: FARXIGA Take 10 mg by mouth daily after breakfast.   divalproex 125 MG DR tablet Commonly known as: DEPAKOTE Take 125 mg by  mouth daily.   divalproex 500 MG 24 hr tablet Commonly known as: DEPAKOTE ER Take 2 tablets (1,000 mg total) by mouth at bedtime.   divalproex 500 MG 24 hr tablet Commonly known as: DEPAKOTE ER Take 1 tablet (500 mg total) by mouth every morning.   Depakote 250 MG DR tablet Generic drug: divalproex Take 250 mg by mouth See admin instructions. Take one at 12PM and one at bedtime.   hydrOXYzine 25 MG tablet Commonly known as: ATARAX/VISTARIL Take 25 mg by mouth 4 (four) times daily.   insulin glargine 100 UNIT/ML injection Commonly known as: LANTUS Inject 0.2 mLs (20 Units total) into the skin at bedtime. What changed: how much to take   lisinopril 20 MG tablet Commonly known as: ZESTRIL TAKE 1/2 TABLET (10 MG TOTAL) BY MOUTH DAILY. What changed:   how much to take  how to take this  when to take this   magnesium oxide 400 (240 Mg) MG tablet Commonly known as: MAG-OX Take 1 tablet (400 mg total) by mouth daily.   metFORMIN 1000 MG tablet Commonly known as: GLUCOPHAGE Take 1,000 mg by mouth 2 (two) times daily with a meal.   QUEtiapine 50 MG tablet Commonly known as: SEROQUEL Take 50 mg by mouth daily at 12 noon.   QUEtiapine 100 MG tablet Commonly known as: SEROQUEL Take 100 mg by mouth in the morning and at bedtime.   traZODone 50 MG tablet Commonly known as: DESYREL Take 50-100 mg by mouth at bedtime.   zolpidem 5 MG tablet Commonly known as: AMBIEN Take 5 mg by mouth at bedtime.       Follow-up Information    primary care provider Follow up.                Time coordinating discharge: 39 minutes  Signed:  Damyen Knoll  [facility] 10/14/2020, 11:46 AM

## 2020-10-14 NOTE — NC FL2 (Signed)
[place] MEDICAID FL2 LEVEL OF CARE SCREENING TOOL     IDENTIFICATION  Patient Name: Ronald Roman Birthdate: 02-26-1961 Sex: male Admission Date (Current Location): 10/11/2020  [facility] and [place] Number:  Producer, television/film/video and Address:  [facility] [place], [address], [place], [name] [address]      Provider Number: [id number]  Attending Physician Name and Address:  Joycelyn Das, MD  Relative Name and Phone Number:       Current Level of Care: Hospital Recommended Level of Care: Other (Comment) (Group home) Prior Approval Number:    Date Approved/Denied:   PASRR Number:    Discharge Plan: Other (Comment) (Group Home)    Current Diagnoses: Patient Active Problem List   Diagnosis Date Noted  . Acute metabolic encephalopathy 10/13/2020  . Unresponsiveness   . Aggressive behavior   . Seizure (HCC) 05/07/2020  . AMS (altered mental status) 02/09/2020  . Schizophrenia (HCC)   . Leukocytosis 02/06/2015  . Sleep apnea 02/05/2015  . AKI (acute kidney injury) (HCC) 02/04/2015  . Seizure disorder (HCC) 02/04/2015  . Acute encephalopathy 02/04/2015  . Diabetes mellitus without complication (HCC)   . Hyperlipidemia   . Hypertension   . Paranoid schizophrenia (HCC)     Orientation RESPIRATION BLADDER Height & Weight     Self,Time,Place  Normal Incontinent Weight:   Height:     BEHAVIORAL SYMPTOMS/MOOD NEUROLOGICAL BOWEL NUTRITION STATUS      Continent Diet (see DC summary)  AMBULATORY STATUS COMMUNICATION OF NEEDS Skin   Supervision Verbally Normal                       Personal Care Assistance Level of Assistance  Bathing,Feeding,Dressing Bathing Assistance: Limited assistance Feeding assistance: Independent Dressing Assistance: Limited assistance     Functional Limitations Info  Speech     Speech Info: Impaired (delayed responses)    SPECIAL CARE FACTORS FREQUENCY                       Contractures  Contractures Info: Not present    Additional Factors Info  Code Status,Allergies,Psychotropic,Insulin Sliding Scale Code Status Info: Full Allergies Info: Cogentin (Benztropine), Penicillins, Prolixin (Fluphenazine) Psychotropic Info: See DC Summary Insulin Sliding Scale Info: See DC Summary       Current Medications (10/14/2020):  This is the current hospital active medication list Current Facility-Administered Medications  Medication Dose Route Frequency Provider Last Rate Last Admin  . acetaminophen (TYLENOL) tablet 650 mg  650 mg Oral Q6H PRN Mikey College T, MD       Or  . acetaminophen (TYLENOL) suppository 650 mg  650 mg Rectal Q6H PRN Mikey College T, MD      . albuterol (PROVENTIL) (2.5 MG/3ML) 0.083% nebulizer solution 2.5 mg  2.5 mg Inhalation Q4H PRN Mikey College T, MD      . amantadine (SYMMETREL) capsule 100 mg  100 mg Oral BID Mikey College T, MD   100 mg at 10/14/20 1052  . cloZAPine (CLOZARIL) tablet 150 mg  150 mg Oral QPC breakfast Mikey College T, MD   150 mg at 10/14/20 0813  . divalproex (DEPAKOTE) DR tablet 125 mg  125 mg Oral q AM Emeline General, MD   125 mg at 10/14/20 0617  . divalproex (DEPAKOTE) DR tablet 250 mg  250 mg Oral BID Mikey College T, MD   250 mg at 10/13/20 2200  . guaiFENesin (MUCINEX) 12 hr tablet  1,200 mg  1,200 mg Oral BID Mikey College T, MD   1,200 mg at 10/14/20 1052  . heparin injection 5,000 Units  5,000 Units Subcutaneous Q12H Mikey College T, MD   5,000 Units at 10/14/20 1056  . hydrALAZINE (APRESOLINE) tablet 25 mg  25 mg Oral Q6H PRN Mikey College T, MD      . hydrOXYzine (ATARAX/VISTARIL) tablet 25 mg  25 mg Oral QID Mikey College T, MD   25 mg at 10/14/20 1052  . insulin aspart (novoLOG) injection 0-9 Units  0-9 Units Subcutaneous TID WC Emeline General, MD   2 Units at 10/13/20 1711  . magnesium oxide (MAG-OX) tablet 400 mg  400 mg Oral BID Pokhrel, Laxman, MD   400 mg at 10/14/20 1052  . ondansetron (ZOFRAN) tablet 4 mg  4 mg Oral Q6H PRN Mikey College T,  MD       Or  . ondansetron [facility]) injection 4 mg  4 mg Intravenous Q6H PRN Mikey College T, MD      . QUEtiapine (SEROQUEL) tablet 100 mg  100 mg Oral BID Mikey College T, MD   100 mg at 10/14/20 1051  . QUEtiapine (SEROQUEL) tablet 50 mg  50 mg Oral Q1200 Mikey College T, MD   50 mg at 10/13/20 1309  . traZODone (DESYREL) tablet 50-100 mg  50-100 mg Oral QHS Mikey College T, MD   50 mg at 10/13/20 2159  . zolpidem (AMBIEN) tablet 5 mg  5 mg Oral QHS Mikey College T, MD   5 mg at 10/13/20 2200     Discharge Medications: TAKE these medications   albuterol 108 (90 Base) MCG/ACT inhaler Commonly known as: VENTOLIN HFA Inhale 2 puffs into the lungs every 4 (four) hours as needed for wheezing or shortness of breath.   amantadine 100 MG capsule Commonly known as: SYMMETREL Take 1 capsule (100 mg total) by mouth 2 (two) times daily.   Clozapine 150 MG Tbdp Take 150 mg by mouth daily after breakfast.   dapagliflozin propanediol 10 MG Tabs tablet Commonly known as: FARXIGA Take 10 mg by mouth daily after breakfast.   divalproex 125 MG DR tablet Commonly known as: DEPAKOTE Take 125 mg by mouth daily.   divalproex 500 MG 24 hr tablet Commonly known as: DEPAKOTE ER Take 2 tablets (1,000 mg total) by mouth at bedtime.   divalproex 500 MG 24 hr tablet Commonly known as: DEPAKOTE ER Take 1 tablet (500 mg total) by mouth every morning.   Depakote 250 MG DR tablet Generic drug: divalproex Take 250 mg by mouth See admin instructions. Take one at 12PM and one at bedtime.   hydrOXYzine 25 MG tablet Commonly known as: ATARAX/VISTARIL Take 25 mg by mouth 4 (four) times daily.   insulin glargine 100 UNIT/ML injection Commonly known as: LANTUS Inject 0.2 mLs (20 Units total) into the skin at bedtime. What changed: how much to take   lisinopril 20 MG tablet Commonly known as: ZESTRIL TAKE 1/2 TABLET (10 MG TOTAL) BY MOUTH DAILY. What changed:   how much to take  how to take  this  when to take this   magnesium oxide 400 (240 Mg) MG tablet Commonly known as: MAG-OX Take 1 tablet (400 mg total) by mouth daily.   metFORMIN 1000 MG tablet Commonly known as: GLUCOPHAGE Take 1,000 mg by mouth 2 (two) times daily with a meal.   QUEtiapine 50 MG tablet Commonly known as: SEROQUEL Take 50 mg by mouth daily  at 12 noon.   QUEtiapine 100 MG tablet Commonly known as: SEROQUEL Take 100 mg by mouth in the morning and at bedtime.   traZODone 50 MG tablet Commonly known as: DESYREL Take 50-100 mg by mouth at bedtime.   zolpidem 5 MG tablet Commonly known as: AMBIEN Take 5 mg by mouth at bedtime.     Relevant Imaging Results:  Relevant Lab Results:   Additional Information SS#: [id number]  Baldemar Lenis, LCSW

## 2020-10-14 NOTE — TOC Initial Note (Signed)
Transition of Care [facility]) - Initial/Assessment Note    Patient Details  Name: Ronald Roman MRN: [id number] Date of Birth: [birth year]  [facility]) CM/SW Contact:    Baldemar Lenis, LCSW Phone Number: 10/14/2020, 3:55 PM  Clinical Narrative:      CSW notified by MD of patient discharge back to group home today. CSW contacted numbers listed in chart, and neither of them were correct. CSW found accurate number for group home, corrected face sheet. CSW spoke with Synetta Fail, Production designer, theatre/television/film at [facility]. CSW explained to Synetta Fail that it appears that the patient had an overdose on medications, that EMS reported he had taken other residents medications. Synetta Fail said that no one had explained that to her and that was serious, she would need to investigate. CSW answered questions as able, and Synetta Fail indicated that she would get to the bottom of what happened. CSW sent patient's clinical information and attempted to arrange transport, but RN indicated that patient is wobbly on his feet and his knee buckled while he was trying to turn around, unsure that patient could navigate in a cab. CSW discussed with Synetta Fail, and patient was a little wobbly sometimes but not a serious fall risk at baseline. CSW discussed with MD, and will hold patient for PT evaluation to determine appropriateness to return to group home level of care. CSW alerted Synetta Fail that patient will not discharge home today. CSW to follow.             Expected Discharge Plan: Group Home Barriers to Discharge: Continued Medical Work up   Patient Goals and CMS Choice Patient states their goals for this hospitalization and ongoing recovery are:: to get back home CMS Medicare.gov Compare Post Acute Care list provided to:: Patient Choice offered to / list presented to : Patient  Expected Discharge Plan and Services Expected Discharge Plan: Group Home     Post Acute Care Choice: NA Living arrangements for the past 2  months: Group Home Expected Discharge Date: 10/14/20                                    Prior Living Arrangements/Services Living arrangements for the past 2 months: Group Home Lives with:: Facility Resident Patient language and need for interpreter reviewed:: No Do you feel safe going back to the place where you live?: Yes      Need for Family Participation in Patient Care: No (Comment) Care giver support system in place?: Yes (comment)   Criminal Activity/Legal Involvement Pertinent to Current Situation/Hospitalization: No - Comment as needed  Activities of Daily Living Home Assistive Devices/Equipment: None ADL Screening (condition at time of admission) Patient's cognitive ability adequate to safely complete daily activities?: Yes Is the patient deaf or have difficulty hearing?: No Does the patient have difficulty seeing, even when wearing glasses/contacts?: No Does the patient have difficulty concentrating, remembering, or making decisions?: No Patient able to express need for assistance with ADLs?: No Does the patient have difficulty dressing or bathing?: No Independently performs ADLs?: Yes (appropriate for developmental age) Does the patient have difficulty walking or climbing stairs?: No Weakness of Legs: Both Weakness of Arms/Hands: None  Permission Sought/Granted Permission sought to share information with : Facility Industrial/product designer granted to share information with : Yes, Verbal Permission Granted     Permission granted to share info w AGENCY: [facility]  Emotional Assessment   Attitude/Demeanor/Rapport: Engaged Affect (typically observed): Appropriate Orientation: : Oriented to Self,Oriented to Place,Oriented to  Time Alcohol / Substance Use: Not Applicable Psych Involvement: Yes (comment)  Admission diagnosis:  Unresponsiveness [R41.89] AMS (altered mental status) [R41.82] Acute metabolic encephalopathy  [G93.41] Patient Active Problem List   Diagnosis Date Noted  . Acute metabolic encephalopathy 10/13/2020  . Unresponsiveness   . Aggressive behavior   . Seizure (HCC) 05/07/2020  . AMS (altered mental status) 02/09/2020  . Schizophrenia (HCC)   . Leukocytosis 02/06/2015  . Sleep apnea 02/05/2015  . AKI (acute kidney injury) (HCC) 02/04/2015  . Seizure disorder (HCC) 02/04/2015  . Acute encephalopathy 02/04/2015  . Diabetes mellitus without complication (HCC)   . Hyperlipidemia   . Hypertension   . Paranoid schizophrenia (HCC)    PCP:  System, Provider Not In Pharmacy:   [facility] [address] [name] [address] Phone: [phone] Fax: [phone]     Social Determinants of Health (SDOH) Interventions    Readmission Risk Interventions No flowsheet data found.

## 2020-10-14 NOTE — Consult Note (Signed)
[facility] Face-to-Face Psychiatry Consult   Reason for Consult:  Schizophrenia, AMS, possible OD Referring Physician:  Joycelyn Das MD Patient Identification: Ronald Roman MRN:  [id number] Principal Diagnosis: Schizophrenia (HCC) Diagnosis:  Active Problems:   AMS (altered mental status)   Acute metabolic encephalopathy   Total Time spent with patient: 30 minutes  Subjective:   Ronald Roman is a 60 y.o. male patient admitted with AMS possible OD. PMH significant for schizophrenia, IDDM, HTN, HLD, kidney stones, OSA. Patient is alert and oriented, calm and cooperative. He was asleep but easily aroused with calling of his name. On evaluation today he tells me that he is fine today. He is unable to recall the events leading to this admission. He is able to verbalize that he did not take any medications that did not belong to him. He also denies any suicidal intent or recently suicide attempts. He is able to have a linear conversation and does not appear to be responding to internal stimuli at this time. He continues to deny suicidal ideation, homicidal ideations and or hallucinations.    HPI: Patient was found in the chair, unresponsive, beside him are pillboxes belongs to other residents from group home.  EMS called and 1 dose of Narcan given and patient waking up. It appears that the patient took other residents medications which according to group home record, narcotics.  Initially blood pressure was low 70s over 40s and improved with 1 bolus of 500 mL fluid to 96/63.  Past Psychiatric History: Schizoaffective Disorder, TD, in a group home  Risk to Self:  No Risk to Others:  No Prior Inpatient Therapy:  Yes Prior Outpatient Therapy:  Yes, group home  Past Medical History:  Past Medical History:  Diagnosis Date  . Diabetes mellitus without complication (HCC)   . GERD (gastroesophageal reflux disease)   . Hyperlipidemia   . Hypertension   . Paranoid schizophrenia (HCC)   . Sleep  apnea   . Uric acid nephrolithiasis    History reviewed. No pertinent surgical history. Family History: History reviewed. No pertinent family history. Family Psychiatric  History: Unable to answer Social History:  Social History   Substance and Sexual Activity  Alcohol Use No     Social History   Substance and Sexual Activity  Drug Use No    Social History   Socioeconomic History  . Marital status: Single    Spouse name: Not on file  . Number of children: Not on file  . Years of education: Not on file  . Highest education level: Not on file  Occupational History  . Not on file  Tobacco Use  . Smoking status: Current Every Day Smoker    Packs/day: 1.00    Types: Cigarettes  . Smokeless tobacco: Never Used  Substance and Sexual Activity  . Alcohol use: No  . Drug use: No  . Sexual activity: Not Currently  Other Topics Concern  . Not on file  Social History Narrative  . Not on file   Social Determinants of Health   Financial Resource Strain: Not on file  Food Insecurity: Not on file  Transportation Needs: Not on file  Physical Activity: Not on file  Stress: Not on file  Social Connections: Not on file   Additional Social History:    Allergies:   Allergies  Allergen Reactions  . Cogentin [Benztropine] Other (See Comments)    Per MAR   . Penicillins Other (See Comments)    Per MAR - unable  to verify patients PCN reaction   . Prolixin [Fluphenazine] Other (See Comments)    Per MAR     Labs:  Results for orders placed or performed during the hospital encounter of 10/11/20 (from the past 48 hour(s))  Glucose, capillary     Status: Abnormal   Collection Time: 10/12/20  1:00 PM  Result Value Ref Range   Glucose-Capillary 104 (H) 70 - 99 mg/dL    Comment: Glucose reference range applies only to samples taken after fasting for at least 8 hours.   Comment 1 Notify RN    Comment 2 Document in Chart   Glucose, capillary     Status: None   Collection Time:  10/12/20  4:26 PM  Result Value Ref Range   Glucose-Capillary 87 70 - 99 mg/dL    Comment: Glucose reference range applies only to samples taken after fasting for at least 8 hours.   Comment 1 Notify RN    Comment 2 Document in Chart   Glucose, capillary     Status: Abnormal   Collection Time: 10/12/20 10:28 PM  Result Value Ref Range   Glucose-Capillary 241 (H) 70 - 99 mg/dL    Comment: Glucose reference range applies only to samples taken after fasting for at least 8 hours.   Comment 1 Notify RN   Basic metabolic panel     Status: Abnormal   Collection Time: 10/13/20  5:00 AM  Result Value Ref Range   Sodium 138 135 - 145 mmol/L   Potassium 5.3 (H) 3.5 - 5.1 mmol/L    Comment: SLIGHT HEMOLYSIS   Chloride 111 98 - 111 mmol/L   CO2 20 (L) 22 - 32 mmol/L   Glucose, Bld 102 (H) 70 - 99 mg/dL    Comment: Glucose reference range applies only to samples taken after fasting for at least 8 hours.   BUN 19 6 - 20 mg/dL   Creatinine, Ser 9.56 0.61 - 1.24 mg/dL   Calcium 8.7 (L) 8.9 - 10.3 mg/dL   GFR, Estimated >21 >30 mL/min    Comment: (NOTE) Calculated using the CKD-EPI Creatinine Equation (2021)    Anion gap 7 5 - 15    Comment: Performed at [facility], [address] [place], [name] [address]  Magnesium     Status: Abnormal   Collection Time: 10/13/20  5:00 AM  Result Value Ref Range   Magnesium 1.3 (L) 1.7 - 2.4 mg/dL    Comment: Performed at [facility], [address] [place], [name] [address]  CBC     Status: Abnormal   Collection Time: 10/13/20  5:44 AM  Result Value Ref Range   WBC 9.2 4.0 - 10.5 K/uL   RBC 4.34 4.22 - 5.81 MIL/uL   Hemoglobin 12.0 (L) 13.0 - 17.0 g/dL   HCT 95.2 (L) 84.1 - 32.4 %   MCV 83.6 80.0 - 100.0 fL   MCH 27.6 26.0 - 34.0 pg   MCHC 33.1 30.0 - 36.0 g/dL   RDW 40.1 02.7 - 25.3 %   Platelets 202 150 - 400 K/uL   nRBC 0.0 0.0 - 0.2 %    Comment: Performed at [facility], [address], [place], [name] [address]   Glucose, capillary     Status: Abnormal   Collection Time: 10/13/20  6:33 AM  Result Value Ref Range   Glucose-Capillary 115 (H) 70 - 99 mg/dL    Comment: Glucose reference range applies only to samples  taken after fasting for at least 8 hours.  Glucose, capillary     Status: Abnormal   Collection Time: 10/13/20 12:41 PM  Result Value Ref Range   Glucose-Capillary 153 (H) 70 - 99 mg/dL    Comment: Glucose reference range applies only to samples taken after fasting for at least 8 hours.   Comment 1 Notify RN    Comment 2 Document in Chart   Glucose, capillary     Status: Abnormal   Collection Time: 10/13/20  4:33 PM  Result Value Ref Range   Glucose-Capillary 162 (H) 70 - 99 mg/dL    Comment: Glucose reference range applies only to samples taken after fasting for at least 8 hours.   Comment 1 Notify RN    Comment 2 Document in Chart   Glucose, capillary     Status: Abnormal   Collection Time: 10/13/20  9:08 PM  Result Value Ref Range   Glucose-Capillary 156 (H) 70 - 99 mg/dL    Comment: Glucose reference range applies only to samples taken after fasting for at least 8 hours.  Lipid panel     Status: None   Collection Time: 10/14/20  2:06 AM  Result Value Ref Range   Cholesterol 161 0 - 200 mg/dL   Triglycerides 323 <557 mg/dL   HDL 44 >32 mg/dL   Total CHOL/HDL Ratio 3.7 RATIO   VLDL 28 0 - 40 mg/dL   LDL Cholesterol 89 0 - 99 mg/dL    Comment:        Total Cholesterol/HDL:CHD Risk Coronary Heart Disease Risk Table                     Men   Women  1/2 Average Risk   3.4   3.3  Average Risk       5.0   4.4  2 X Average Risk   9.6   7.1  3 X Average Risk  23.4   11.0        Use the calculated Patient Ratio above and the CHD Risk Table to determine the patient's CHD Risk.        ATP III CLASSIFICATION (LDL):  <100     mg/dL   Optimal  202-542  mg/dL   Near or Above                    Optimal  130-159  mg/dL   Borderline  706-237  mg/dL   High  >628     mg/dL   Very  High Performed at [facility], [address], [place], [name] [address]   Basic metabolic panel     Status: Abnormal   Collection Time: 10/14/20  2:06 AM  Result Value Ref Range   Sodium 137 135 - 145 mmol/L   Potassium 4.6 3.5 - 5.1 mmol/L   Chloride 108 98 - 111 mmol/L   CO2 21 (L) 22 - 32 mmol/L   Glucose, Bld 140 (H) 70 - 99 mg/dL    Comment: Glucose reference range applies only to samples taken after fasting for at least 8 hours.   BUN 17 6 - 20 mg/dL   Creatinine, Ser 6.16 0.61 - 1.24 mg/dL   Calcium 9.1 8.9 - 07.3 mg/dL   GFR, Estimated >71 >06 mL/min    Comment: (NOTE) Calculated using the CKD-EPI Creatinine Equation (2021)    Anion gap 8 5 - 15    Comment: Performed at [facility], [address] [place], [place] [address]  CBC     Status: Abnormal   Collection Time: 10/14/20  2:06 AM  Result Value Ref Range   WBC 9.1 4.0 - 10.5 K/uL   RBC 4.47 4.22 - 5.81 MIL/uL   Hemoglobin 12.4 (L) 13.0 - 17.0 g/dL   HCT 60.437.3 (L) 54.039.0 - 98.152.0 %   MCV 83.4 80.0 - 100.0 fL   MCH 27.7 26.0 - 34.0 pg   MCHC 33.2 30.0 - 36.0 g/dL   RDW 19.114.3 47.811.5 - 29.515.5 %   Platelets 202 150 - 400 K/uL   nRBC 0.0 0.0 - 0.2 %    Comment: Performed at [facility], [address] [place], [place] [address]  Magnesium     Status: Abnormal   Collection Time: 10/14/20  2:06 AM  Result Value Ref Range   Magnesium 1.4 (L) 1.7 - 2.4 mg/dL    Comment: Performed at [facility], [address] [place], [place] [address]  Phosphorus     Status: None   Collection Time: 10/14/20  2:06 AM  Result Value Ref Range   Phosphorus 4.2 2.5 - 4.6 mg/dL    Comment: Performed at [facility], [address] [place], [place] [address]  Glucose, capillary     Status: Abnormal   Collection Time: 10/14/20  6:29 AM  Result Value Ref Range   Glucose-Capillary 105 (H) 70 - 99 mg/dL    Comment: Glucose reference range applies only to samples taken after fasting for at least 8 hours.     Current Facility-Administered Medications  Medication Dose Route Frequency Provider Last Rate Last Admin  . acetaminophen (TYLENOL) tablet 650 mg  650 mg Oral Q6H PRN Mikey CollegeZhang, Ping T, MD       Or  . acetaminophen (TYLENOL) suppository 650 mg  650 mg Rectal Q6H PRN Mikey CollegeZhang, Ping T, MD      . albuterol (PROVENTIL) (2.5 MG/3ML) 0.083% nebulizer solution 2.5 mg  2.5 mg Inhalation Q4H PRN Mikey CollegeZhang, Ping T, MD      . amantadine (SYMMETREL) capsule 100 mg  100 mg Oral BID Mikey CollegeZhang, Ping T, MD   100 mg at 10/13/20 2159  . cloZAPine (CLOZARIL) tablet 150 mg  150 mg Oral QPC breakfast Mikey CollegeZhang, Ping T, MD   150 mg at 10/14/20 0813  . divalproex (DEPAKOTE) DR tablet 125 mg  125 mg Oral q AM Emeline GeneralZhang, Ping T, MD   125 mg at 10/14/20 0617  . divalproex (DEPAKOTE) DR tablet 250 mg  250 mg Oral BID Mikey CollegeZhang, Ping T, MD   250 mg at 10/13/20 2200  . guaiFENesin (MUCINEX) 12 hr tablet 1,200 mg  1,200 mg Oral BID Mikey CollegeZhang, Ping T, MD   1,200 mg at 10/13/20 2159  . heparin injection 5,000 Units  5,000 Units Subcutaneous Q12H Mikey CollegeZhang, Ping T, MD   5,000 Units at 10/13/20 2159  . hydrALAZINE (APRESOLINE) tablet 25 mg  25 mg Oral Q6H PRN Mikey CollegeZhang, Ping T, MD      . hydrOXYzine (ATARAX/VISTARIL) tablet 25 mg  25 mg Oral QID Mikey CollegeZhang, Ping T, MD   25 mg at 10/13/20 2159  . insulin aspart (novoLOG) injection 0-9 Units  0-9 Units Subcutaneous TID WC Emeline GeneralZhang, Ping T, MD   2 Units at 10/13/20 1711  . magnesium oxide (MAG-OX) tablet 400 mg  400 mg Oral BID Pokhrel, Laxman, MD   400 mg at 10/13/20 2159  . ondansetron (ZOFRAN) tablet 4  mg  4 mg Oral Q6H PRN Mikey College T, MD       Or  . ondansetron [facility] injection 4 mg  4 mg Intravenous Q6H PRN Mikey College T, MD      . QUEtiapine (SEROQUEL) tablet 100 mg  100 mg Oral BID Mikey College T, MD   100 mg at 10/13/20 2159  . QUEtiapine (SEROQUEL) tablet 50 mg  50 mg Oral Q1200 Mikey College T, MD   50 mg at 10/13/20 1309  . traZODone (DESYREL) tablet 50-100 mg  50-100 mg Oral QHS Mikey College T, MD   50 mg at  10/13/20 2159  . zolpidem (AMBIEN) tablet 5 mg  5 mg Oral QHS Mikey College T, MD   5 mg at 10/13/20 2200    Musculoskeletal: Strength & Muscle Tone: within normal limits Gait & Station: in bed during assesssment Patient leans: N/A            Psychiatric Specialty Exam:  Presentation  General Appearance: Appropriate for Environment; Casual  Eye Contact:Minimal  Speech:Clear and Coherent; Slow  Speech Volume:Normal  Handedness:Right   Mood and Affect  Mood:Euthymic  Affect:Appropriate; Congruent   Thought Process  Thought Processes:Coherent; Goal Directed  Descriptions of Associations:Intact  Orientation:Full (Time, Place and Person)  Thought Content:Logical  History of Schizophrenia/Schizoaffective disorder:Yes  Duration of Psychotic Symptoms:Greater than six months  Hallucinations:Hallucinations: None  Ideas of Reference:None  Suicidal Thoughts:Suicidal Thoughts: No  Homicidal Thoughts:Homicidal Thoughts: No   Sensorium  Memory:Immediate Fair; Recent Fair; Remote Fair  Judgment:Poor  Insight:Poor   Executive Functions  Concentration:Fair  Attention Span:Fair  Recall:Poor  Fund of Knowledge:Fair  Language:Poor   Psychomotor Activity  Psychomotor Activity:Psychomotor Activity: Normal   Assets  Assets:Communication Skills; Housing; Health and safety inspector; Social Support; Physical Health; Leisure Time   Sleep  Sleep:Sleep: Fair   Physical Exam: Physical Exam Vitals and nursing note reviewed.  Constitutional:      General: He is not in acute distress.    Appearance: Normal appearance.  HENT:     Head: Normocephalic and atraumatic.  Cardiovascular:     Rate and Rhythm: Normal rate.  Pulmonary:     Effort: Pulmonary effort is normal.  Musculoskeletal:        General: Normal range of motion.  Neurological:     Mental Status: He is alert.  Psychiatric:        Mood and Affect: Mood normal.        Behavior:  Behavior normal.    Review of Systems  Constitutional: Negative for chills and fever.  Respiratory: Negative for shortness of breath.   Cardiovascular: Negative for chest pain.  Gastrointestinal: Negative for abdominal pain and vomiting.  Neurological: Negative for headaches.  Psychiatric/Behavioral: Negative.    Blood pressure (!) 126/91, pulse 89, temperature 98.6 F (37 C), temperature source Oral, resp. rate 19, SpO2 100 %. There is no height or weight on file to calculate BMI.  Treatment Plan Summary: Case discussed with Dr. Lucianne Muss Plan Psych cleared  Labs obtained are normal.  Patient is psych cleared to discharge to group home, with previous existing outpatient resources. His Vlaporic acid level is low (23), may benefit from increase in outpatient setting if patient warrants. Patient has not exhibited any disruptive behaviors while on the unit.   -Continue Depakote ER 125 mg AM and 250 mg BID -Continue Clozaril 150 mg AM -Continue Ambien 5 mg daily -Continue Seroquel 100 mg BID and 50 mg at lunch  Disposition: No evidence of imminent  risk to self or others at present.   Patient does not meet criteria for psychiatric inpatient admission.  Maryagnes Amos, FNP 10/14/2020 9:48 AM

## 2020-10-14 NOTE — Evaluation (Signed)
Physical Therapy Evaluation Patient Details Name: Ronald Roman MRN: [id number] DOB: July 28, 1960 Today's Date: 10/14/2020   History of Present Illness  Pt adm 5/21 with acute toxic encephalopathy due to suspected narcotic overdose. Pt with AKI as well. PMH - schizophrenia, seizure disorder, HTN, DM.  Clinical Impression  Pt presents to PT with slightly unsteady gait but no overt loss of balance. Pt is impulsive and this is likely baseline as he has demonstrated this in prior admissions. Pt likely not far from baseline as far as mobility goes and no follow up PT recommended.     Follow Up Recommendations No PT follow up    Equipment Recommendations  None recommended by PT    Recommendations for Other Services       Precautions / Restrictions Precautions Precautions: Fall      Mobility  Bed Mobility               General bed mobility comments: Pt up in chair    Transfers Overall transfer level: Needs assistance Equipment used: None;Rolling walker (2 wheeled) Transfers: Sit to/from Stand Sit to Stand: Supervision         General transfer comment: supervision for safety  Ambulation/Gait Ambulation/Gait assistance: Supervision Gait Distance (Feet): 200 Feet Assistive device: Rolling walker (2 wheeled);None Gait Pattern/deviations: Step-through pattern;Decreased stride length;Drifts right/left Gait velocity: decr Gait velocity interpretation: 1.31 - 2.62 ft/sec, indicative of limited community ambulator General Gait Details: Initially had pt use rolling walker. Pt was pushing walker way too far in front and it was more of a distraction than a help. Once removed the walker pt with improved gait. Slightly unsteady with a right and left drift but no overt loss of balance.  Stairs            Wheelchair Mobility    Modified Rankin (Stroke Patients Only)       Balance Overall balance assessment: Needs assistance Sitting-balance support: No upper extremity  supported;Feet supported Sitting balance-Leahy Scale: Normal     Standing balance support: No upper extremity supported;During functional activity Standing balance-Leahy Scale: Good                               Pertinent Vitals/Pain Pain Assessment: No/denies pain    Home Living Family/patient expects to be discharged to:: Group home                      Prior Function Level of Independence: Needs assistance   Gait / Transfers Assistance Needed: Amb independently. Pt states he has a walker but when asked if he uses the walker he doesn't answer the question           Hand Dominance   Dominant Hand: Right    Extremity/Trunk Assessment   Upper Extremity Assessment Upper Extremity Assessment: Overall WFL for tasks assessed    Lower Extremity Assessment Lower Extremity Assessment: Overall WFL for tasks assessed       Communication   Communication: No difficulties  Cognition Arousal/Alertness: Awake/alert Behavior During Therapy: Impulsive Overall Cognitive Status: No family/caregiver present to determine baseline cognitive functioning                                        General Comments      Exercises     Assessment/Plan    PT Assessment  Patent does not need any further PT services  PT Problem List         PT Treatment Interventions      PT Goals (Current goals can be found in the Care Plan section)  Acute Rehab PT Goals PT Goal Formulation: All assessment and education complete, DC therapy    Frequency     Barriers to discharge        Co-evaluation               AM-PAC PT "6 Clicks" Mobility  Outcome Measure Help needed turning from your back to your side while in a flat bed without using bedrails?: None Help needed moving from lying on your back to sitting on the side of a flat bed without using bedrails?: None Help needed moving to and from a bed to a chair (including a wheelchair)?: None Help  needed standing up from a chair using your arms (e.g., wheelchair or bedside chair)?: None Help needed to walk in hospital room?: None Help needed climbing 3-5 steps with a railing? : A Little 6 Click Score: 23    End of Session Equipment Utilized During Treatment: Gait belt Activity Tolerance: Patient tolerated treatment well Patient left: in chair;with call bell/phone within reach;with chair alarm set Nurse Communication: Mobility status PT Visit Diagnosis: Unsteadiness on feet (R26.81)    Time: 9767-3419 PT Time Calculation (min) (ACUTE ONLY): 14 min   Charges:   PT Evaluation $PT Eval Low Complexity: 1 Low          [facility] PT Acute Rehabilitation Services Pager [phone] Office [phone]   Angelina Ok [facility] 10/14/2020, 4:33 PM

## 2020-10-15 DIAGNOSIS — E119 Type 2 diabetes mellitus without complications: Secondary | ICD-10-CM | POA: Diagnosis not present

## 2020-10-15 DIAGNOSIS — R4182 Altered mental status, unspecified: Secondary | ICD-10-CM | POA: Diagnosis not present

## 2020-10-15 DIAGNOSIS — I10 Essential (primary) hypertension: Secondary | ICD-10-CM | POA: Diagnosis not present

## 2020-10-15 DIAGNOSIS — N179 Acute kidney failure, unspecified: Secondary | ICD-10-CM | POA: Diagnosis not present

## 2020-10-15 LAB — GLUCOSE, CAPILLARY
Glucose-Capillary: 123 mg/dL — ABNORMAL HIGH (ref 70–99)
Glucose-Capillary: 184 mg/dL — ABNORMAL HIGH (ref 70–99)

## 2020-10-15 LAB — VALPROIC ACID LEVEL: Valproic Acid Lvl: 29 ug/mL — ABNORMAL LOW (ref 50.0–100.0)

## 2020-10-15 MED ORDER — DIVALPROEX SODIUM 125 MG PO DR TAB
125.0000 mg | DELAYED_RELEASE_TABLET | Freq: Every day | ORAL | Status: DC
Start: 2020-10-15 — End: 2020-10-28

## 2020-10-15 NOTE — Progress Notes (Signed)
Pt has belongings with them. All IV and tele have been disconnected. Pt was sent in good spirits.

## 2020-10-15 NOTE — TOC Transition Note (Signed)
Transition of Care [facility]) - CM/SW Discharge Note   Patient Details  Name: Ronald Roman MRN: [id number] Date of Birth: Sep 12, 1960  Transition of Care [facility]) CM/SW Contact:  Baldemar Lenis, LCSW Phone Number: 10/15/2020, 11:16 AM   Clinical Narrative:   Patient discharging back to group home today, ambulatory. Group home to provide transportation. CSW obtained patient's group home medical record that EMS had taken, sent back to group home with staff at bedside. No need for nurse to call report, per administrator.    Final next level of care: Group Home Barriers to Discharge: Barriers Resolved   Patient Goals and CMS Choice Patient states their goals for this hospitalization and ongoing recovery are:: to get back home CMS Medicare.gov Compare Post Acute Care list provided to:: Patient Choice offered to / list presented to : Patient  Discharge Placement                Patient to be transferred to facility by: Group home staff Name of family member notified: Self Patient and family notified of of transfer: 10/15/20  Discharge Plan and Services     Post Acute Care Choice: NA                               Social Determinants of Health (SDOH) Interventions     Readmission Risk Interventions No flowsheet data found.

## 2020-10-15 NOTE — Discharge Summary (Addendum)
Physician Discharge Summary  Ronald Roman GBT:[id number] DOB: 12-Dec-1960 DOA: 10/11/2020  PCP: System, Provider Not In  Admit date: 10/11/2020 Discharge date: 10/15/2020  Time spent: 45 minutes  Recommendations for Outpatient Follow-up:  Patient will be discharged to group home.  Patient will need to follow up with primary care provider within one week of discharge repeat magnesium level.  Follow up outpatient psychiatry. Patient should continue medications as prescribed.  Patient should follow a heart healthy/carb modified diet.    Discharge Diagnoses:  Acute toxic encephalopathy Acute kidney injury Schizophrenia/seizure disorder Essential hypertension Diabetes mellitus, type II Hypomagnesemia  Discharge Condition: Stable  Diet recommendation: heart healthy/carb modified   There were no vitals filed for this visit.  History of present illness:  On 10/11/2020 by Dr. Mikey College Ronald Roman is a 60 y.o. male with medical history significant of schizophrenia, IDDM, HTN, HLD, kidney stones, OSA, came with after mentations.  Patient was found in the chair, unresponsive, beside him are pillboxes belongs to other residents from group home.  EMS called and 1 dose of Narcan given and patient waking up. It appears that the patient took other residents medications which according to group home record, narcotics.  Initially blood pressure was low 70s over 40s and improved with 1 bolus of 500 mL fluid to 96/63.  Patient arousable in the ED, still sleepy, no complaints of pain or shortness of breath or cough.  Patient does not remember what happened.  Hospital Course:  Acute toxic encephalopathy -Suspected polypharmacy, narcotic overdose.  Patient did receive Narcan -Patient did have one-to-one sitter and restraints earlier this admission -Psychiatry consulted for history of schizophrenia -Patient appears to be back at baseline  Acute kidney injury -Resolved with IVFs -Renal  ultrasound unremarkable  -uric acid 6.4  Schizophrenia/seizure disorder -Psychiatry consulted and appreciated -Continue Depakote 125 mg in the morning followed by 250 mg at noon and with bedtime -Continue cloazpine, Seroquel -Valproic acid level 29, discussed with psychiatry- may follow up as an outpatient, no changes to current meds at this time -Clozapine level pending-and can be followed up by primary physician for outpatient psychiatry  Essential hypertension -Continue lisinopril  Diabetes mellitus, type II -Continue Farxiga, Lantus, metformin  Hypomagnesemia -Currently on oral replacement -Repeat magnesium in 1 week  Procedures: None  Consultations: Psychiatry  Discharge Exam: Vitals:   10/15/20 0342 10/15/20 0808  BP: 117/78 (!) 116/94  Pulse: 89 (!) 104  Resp: 18 16  Temp: 97.7 F (36.5 C)   SpO2: 99% 100%     General: Well developed, well nourished, NAD  HEENT: NCAT, mucous membranes moist.  Cardiovascular: S1 S2 auscultated, RRR  Respiratory: Clear to auscultation bilaterally   Abdomen: Soft, nontender, nondistended, + bowel sounds  Extremities: warm dry without cyanosis clubbing or edema  Neuro: Awake and alert, moving extremities with ease  Psych: appropriate mood and affect  Discharge Instructions Discharge Instructions    Diet - low sodium heart healthy   Complete by: As directed    Increase activity slowly   Complete by: As directed      Allergies as of 10/15/2020      Reactions   Cogentin [benztropine] Other (See Comments)   Per MAR    Penicillins Other (See Comments)   Per MAR - unable to verify patients PCN reaction    Prolixin [fluphenazine] Other (See Comments)   Per MAR       Medication List    TAKE these medications   albuterol 108 (90  Base) MCG/ACT inhaler Commonly known as: VENTOLIN HFA Inhale 2 puffs into the lungs every 4 (four) hours as needed for wheezing or shortness of breath.   amantadine 100 MG  capsule Commonly known as: SYMMETREL Take 1 capsule (100 mg total) by mouth 2 (two) times daily.   Clozapine 150 MG Tbdp Take 150 mg by mouth daily after breakfast.   dapagliflozin propanediol 10 MG Tabs tablet Commonly known as: FARXIGA Take 10 mg by mouth daily after breakfast.   Depakote 250 MG DR tablet Generic drug: divalproex Take 250 mg by mouth See admin instructions. Take one at 12PM and one at bedtime. What changed:   Another medication with the same name was changed. Make sure you understand how and when to take each.  Another medication with the same name was removed. Continue taking this medication, and follow the directions you see here.   divalproex 125 MG DR tablet Commonly known as: DEPAKOTE Take 1 tablet (125 mg total) by mouth daily with breakfast. What changed:   when to take this  Another medication with the same name was removed. Continue taking this medication, and follow the directions you see here.   hydrOXYzine 25 MG tablet Commonly known as: ATARAX/VISTARIL Take 25 mg by mouth 4 (four) times daily.   insulin glargine 100 UNIT/ML injection Commonly known as: LANTUS Inject 0.2 mLs (20 Units total) into the skin at bedtime. What changed: how much to take   lisinopril 20 MG tablet Commonly known as: ZESTRIL TAKE 1/2 TABLET (10 MG TOTAL) BY MOUTH DAILY. What changed:   how much to take  how to take this  when to take this   magnesium oxide 400 (240 Mg) MG tablet Commonly known as: MAG-OX Take 1 tablet (400 mg total) by mouth daily.   metFORMIN 1000 MG tablet Commonly known as: GLUCOPHAGE Take 1,000 mg by mouth 2 (two) times daily with a meal.   QUEtiapine 50 MG tablet Commonly known as: SEROQUEL Take 50 mg by mouth daily at 12 noon.   QUEtiapine 100 MG tablet Commonly known as: SEROQUEL Take 100 mg by mouth in the morning and at bedtime.   traZODone 50 MG tablet Commonly known as: DESYREL Take 50-100 mg by mouth at bedtime.    zolpidem 5 MG tablet Commonly known as: AMBIEN Take 5 mg by mouth at bedtime.      Allergies  Allergen Reactions  . Cogentin [Benztropine] Other (See Comments)    Per MAR   . Penicillins Other (See Comments)    Per MAR - unable to verify patients PCN reaction   . Prolixin [Fluphenazine] Other (See Comments)    Per MAR     Follow-up Information    primary care provider Follow up.                The results of significant diagnostics from this hospitalization (including imaging, microbiology, ancillary and laboratory) are listed below for reference.    Significant Diagnostic Studies: CT HEAD WO CONTRAST  Result Date: 10/11/2020 CLINICAL DATA:  Mental status change. EXAM: CT HEAD WITHOUT CONTRAST TECHNIQUE: Contiguous axial images were obtained from the base of the skull through the vertex without intravenous contrast. COMPARISON:  Oct 05, 2020 FINDINGS: Brain: No evidence of acute infarction, hemorrhage, hydrocephalus, extra-axial collection or mass lesion/mass effect. Vascular: Calcified atherosclerosis in the intracranial carotids. Skull: Normal. Negative for fracture or focal lesion. Sinuses/Orbits: No acute finding. Other: None. IMPRESSION: No acute abnormalities identified. Electronically Signed   By: Onalee Hua  Mayford Knife III M.D   On: 10/11/2020 14:16   CT Head Wo Contrast  Result Date: 10/05/2020 CLINICAL DATA:  Seizure EXAM: CT HEAD WITHOUT CONTRAST TECHNIQUE: Contiguous axial images were obtained from the base of the skull through the vertex without intravenous contrast. COMPARISON:  June 22, 2020 FINDINGS: Brain: There is slight diffuse atrophy, stable. There is no intracranial mass, hemorrhage, extra-axial fluid collection, or midline shift. Scattered foci of decreased attenuation in the centra semiovale bilaterally are stable. No acute infarct evident. Vascular: No hyperdense vessels. Foci of calcification noted in each carotid siphon. Skull: Bony calvarium appears intact.  Sinuses/Orbits: Visualized paranasal sinuses are clear. Orbits appear symmetric bilaterally. Other: Visualized mastoid air cells are clear. Stable foci of apparent scarring in the scalp region appear stable. IMPRESSION: Atrophy with mild periventricular small vessel disease. No acute infarct. No mass or hemorrhage. There are foci of arterial vascular calcification. Electronically Signed   By: Bretta Bang III M.D.   On: 10/05/2020 15:55   US Renal  Result Date: 10/11/2020 CLINICAL DATA:  Renal failure EXAM: RENAL / URINARY TRACT ULTRASOUND COMPLETE COMPARISON:  None. FINDINGS: Right Kidney: Renal measurements: 10.0 x 5.1 x 4.8 cm = volume: 128 mL. Echogenicity within normal limits. No mass or hydronephrosis visualized. Left Kidney: Renal measurements: 10.2 x 6.2 x 4.2 cm = volume: 138 mL. 8 normal echotexture. No hydronephrosis. 3.3 cm midpole cyst. Bladder: Appears normal for degree of bladder distention. Other: None. IMPRESSION: No acute findings.  No hydronephrosis. Electronically Signed   By: Charlett Nose M.D.   On: 10/11/2020 16:01   DG Chest Port 1 View  Result Date: 10/11/2020 CLINICAL DATA:  Unresponsive. EXAM: PORTABLE CHEST 1 VIEW COMPARISON:  None. FINDINGS: The cardiomediastinal silhouette is unremarkable. Increased MEDIAL RIGHT LOWER lung opacity noted. No consolidation, pleural effusion, pneumothorax or acute bony abnormality noted. IMPRESSION: Increased MEDIAL RIGHT LOWER lung opacity which may represent atelectasis or airspace disease/pneumonia. Electronically Signed   By: Harmon Pier M.D.   On: 10/11/2020 14:27    Microbiology: Recent Results (from the past 240 hour(s))  Resp Panel by RT-PCR (Flu A&B, Covid) Nasopharyngeal Swab     Status: None   Collection Time: 10/11/20  4:18 PM   Specimen: Nasopharyngeal Swab; Nasopharyngeal(NP) swabs in vial transport medium  Result Value Ref Range Status   SARS Coronavirus 2 by RT PCR NEGATIVE NEGATIVE Final    Comment: (NOTE) SARS-CoV-2  target nucleic acids are NOT DETECTED.  The SARS-CoV-2 RNA is generally detectable in upper respiratory specimens during the acute phase of infection. The lowest concentration of SARS-CoV-2 viral copies this assay can detect is 138 copies/mL. A negative result does not preclude SARS-Cov-2 infection and should not be used as the sole basis for treatment or other patient management decisions. A negative result may occur with  improper specimen collection/handling, submission of specimen other than nasopharyngeal swab, presence of viral mutation(s) within the areas targeted by this assay, and inadequate number of viral copies(<138 copies/mL). A negative result must be combined with clinical observations, patient history, and epidemiological information. The expected result is Negative.  Fact Sheet for Patients:  BloggerCourse.com  Fact Sheet for Healthcare Providers:  SeriousBroker.it  This test is no t yet approved or cleared by the [place] FDA and  has been authorized for detection and/or diagnosis of SARS-CoV-2 by FDA under an Emergency Use Authorization (EUA). This EUA will remain  in effect (meaning this test can be used) for the duration of the COVID-19 declaration under Section  564(b)(1) of the Act, 21 U.S.C.section 360bbb-3(b)(1), unless the authorization is terminated  or revoked sooner.       Influenza A by PCR NEGATIVE NEGATIVE Final   Influenza B by PCR NEGATIVE NEGATIVE Final    Comment: (NOTE) The Xpert Xpress SARS-CoV-2/FLU/RSV plus assay is intended as an aid in the diagnosis of influenza from Nasopharyngeal swab specimens and should not be used as a sole basis for treatment. Nasal washings and aspirates are unacceptable for Xpert Xpress SARS-CoV-2/FLU/RSV testing.  Fact Sheet for Patients: BloggerCourse.comhttps://www.fda.gov/media/152166/download  Fact Sheet for Healthcare  Providers: SeriousBroker.ithttps://www.fda.gov/media/152162/download  This test is not yet approved or cleared by the [place] FDA and has been authorized for detection and/or diagnosis of SARS-CoV-2 by FDA under an Emergency Use Authorization (EUA). This EUA will remain in effect (meaning this test can be used) for the duration of the COVID-19 declaration under Section 564(b)(1) of the Act, 21 U.S.C. section 360bbb-3(b)(1), unless the authorization is terminated or revoked.  Performed at [facility], [address] [place], [place] [address]      Labs: Basic Metabolic Panel: Recent Labs  Lab 10/09/20 1138 10/11/20 1335 10/12/20 0117 10/13/20 0500 10/14/20 0206  NA 138 140 141 138 137  K 4.6 4.7 4.7 5.3* 4.6  CL 108 109 110 111 108  CO2 22 22 23  20* 21*  GLUCOSE 67* 104* 85 102* 140*  BUN 29* 37* 31* 19 17  CREATININE 1.65* 2.48* 1.52* 1.24 1.13  CALCIUM 9.5 8.9 8.7* 8.7* 9.1  MG  --   --   --  1.3* 1.4*  PHOS  --   --   --   --  4.2   Liver Function Tests: Recent Labs  Lab 10/09/20 1138 10/11/20 1335  AST 24 22  ALT 17 16  ALKPHOS 62 56  BILITOT 0.5 0.6  PROT 7.0 5.8*  ALBUMIN 3.6 3.2*   No results for input(s): LIPASE, AMYLASE in the last 168 hours. Recent Labs  Lab 10/11/20 1618  AMMONIA 9   CBC: Recent Labs  Lab 10/09/20 1138 10/11/20 1335 10/12/20 0117 10/13/20 0544 10/14/20 0206  WBC 9.6 10.8* 11.2* 9.2 9.1  NEUTROABS  --  7.9*  --   --   --   HGB 12.8* 11.2* 11.8* 12.0* 12.4*  HCT 40.2 35.8* 36.8* 36.3* 37.3*  MCV 86.5 87.3 86.8 83.6 83.4  PLT 210 202 195 202 202   Cardiac Enzymes: No results for input(s): CKTOTAL, CKMB, CKMBINDEX, TROPONINI in the last 168 hours. BNP: BNP (last 3 results) Recent Labs    05/07/20 1028  BNP 54.5    ProBNP (last 3 results) No results for input(s): PROBNP in the last 8760 hours.  CBG: Recent Labs  Lab 10/14/20 1152 10/14/20 1545 10/14/20 2109 10/15/20 0624 10/15/20 0827  GLUCAP 157* 156* 122*  123* 184*       Signed:  Kari Kerth  [facility] 10/15/2020, 11:40 AM

## 2020-10-16 LAB — CLOZAPINE (CLOZARIL)
Clozapine Lvl: 127 ng/mL — ABNORMAL LOW (ref 350–650)
NorClozapine: 44 ng/mL
Total(Cloz+Norcloz): 171 ng/mL

## 2020-10-20 ENCOUNTER — Other Ambulatory Visit: Payer: Self-pay

## 2020-10-20 ENCOUNTER — Ambulatory Visit (HOSPITAL_COMMUNITY)
Admission: EM | Admit: 2020-10-20 | Discharge: 2020-10-20 | Disposition: A | Discharge status: Home / Self Care | Payer: Medicaid Other | Attending: Student in an Organized Health Care Education/Training Program | Admitting: Student in an Organized Health Care Education/Training Program

## 2020-10-20 DIAGNOSIS — Z593 Problems related to living in residential institution: Secondary | ICD-10-CM | POA: Insufficient documentation

## 2020-10-20 DIAGNOSIS — Z91138 Patient's unintentional underdosing of medication regimen for other reason: Secondary | ICD-10-CM | POA: Insufficient documentation

## 2020-10-20 DIAGNOSIS — F209 Schizophrenia, unspecified: Secondary | ICD-10-CM | POA: Insufficient documentation

## 2020-10-20 DIAGNOSIS — T424X6A Underdosing of benzodiazepines, initial encounter: Secondary | ICD-10-CM | POA: Insufficient documentation

## 2020-10-20 DIAGNOSIS — F2081 Schizophreniform disorder: Secondary | ICD-10-CM

## 2020-10-20 MED ORDER — QUETIAPINE FUMARATE 200 MG PO TABS: 200.0000 mg | ORAL_TABLET | Freq: Once | ORAL | 0 refills | Status: DC

## 2020-10-20 MED ORDER — QUETIAPINE FUMARATE 200 MG PO TABS
200.0000 mg | ORAL_TABLET | Freq: Once | ORAL | Status: AC
  Administered 2020-10-20: 200 mg via ORAL
  Filled 2020-10-20: qty 1
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
## 2020-10-20 NOTE — ED Notes (Signed)
Pt discharged in no acute distress. Verbalized understanding of new orders on dosage of Seroquel. Safety maintained.

## 2020-10-20 NOTE — Discharge Instructions (Addendum)
Patient is instructed to take all prescribed medications as recommended. Report any side effects or adverse reactions to your outpatient psychiatrist. Patient is instructed to abstain from alcohol and illegal drugs while on prescription medications. In the event of worsening symptoms, patient is instructed to call the crisis hotline, 911, or go to the nearest emergency department for evaluation and treatment.   
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
## 2020-10-21 ENCOUNTER — Ambulatory Visit (HOSPITAL_COMMUNITY)
Admission: EM | Admit: 2020-10-21 | Discharge: 2020-10-21 | Disposition: A | Discharge status: Home / Self Care | Payer: Medicaid Other

## 2020-10-21 DIAGNOSIS — F200 Paranoid schizophrenia: Secondary | ICD-10-CM

## 2020-10-21 NOTE — ED Provider Notes (Signed)
Behavioral Health Urgent Care Medical Screening Exam  Patient Name: Ronald Roman MRN: [id number] Date of Evaluation: 10/21/20 Chief Complaint:   Concerning statement Diagnosis:  Final diagnoses:  Paranoid schizophrenia (HCC)    History of Present illness: WILBERTO CONSOLE is a 60 y.o. male w/ PPH of schizophrenia. Patient was here at [facility] 10/20/2020 and prescribed increase in Seroquel to 200mg  BID and recommended to come back to walk-in clinic for starting OP treatment to get back on clozaril as the group home reported.   Patient initially presented for in the Walk-in clinic with his from his group home. Unfortunately they were full. Patient and Dorthula Perfect came downstairs because patient forgot his paper prescription for the Seroquel has been increased. The prescription was retrieved and handed to [place]. Per front desk staff as patient and L[place]ka were leaving, Synetta Fail reported to front desk staff that the patient made the comment that he was going to kill her and everyone else. On assessment patient denies this and has no intention of harming anyone. Patient reports that Synetta Fail went to fill his prescription and told him to wait. Patient denies SI and AH. Patient reports that he does still see the snakes and sometimes he tries to talk to them but otherwise he feels fine.   Psychiatric Specialty Exam  Presentation  General Appearance:Casual  Eye Contact:Fair  Speech:Pressured  Speech Volume:Increased  Handedness:Right   Mood and Affect  Mood:Euthymic  Affect:Congruent   Thought Process  Thought Processes:Disorganized  Descriptions of Associations:Circumstantial  Orientation:None  Thought Content:Scattered  Diagnosis of Schizophrenia or Schizoaffective disorder in past: Yes  Duration of Psychotic Symptoms: Greater than six months (althouhg most current symptoms appear to be no more than 31 days old)  Hallucinations:Visual  Ideas of Reference:None  Suicidal  Thoughts:No  Homicidal Thoughts:No   Sensorium  Memory:Immediate Poor; Recent Poor; Remote Fair  Judgment:Impaired  Insight:None   Executive Functions  Concentration:Poor  Attention Span:Poor  Recall:Poor  Fund of Knowledge:Poor  Language:Fair   Psychomotor Activity  Psychomotor Activity:Increased   Assets  Assets:Housing   Sleep  Sleep:Fair  Number of hours: No data recorded  No data recorded  Physical Exam: Physical Exam Constitutional:      Appearance: Normal appearance.  HENT:     Head: Normocephalic and atraumatic.     Nose: Nose normal.  Eyes:     Extraocular Movements: Extraocular movements intact.     Pupils: Pupils are equal, round, and reactive to light.  Cardiovascular:     Rate and Rhythm: Normal rate.  Pulmonary:     Effort: Pulmonary effort is normal.  Musculoskeletal:        General: Normal range of motion.  Skin:    General: Skin is warm and dry.  Neurological:     General: No focal deficit present.     Mental Status: He is alert.    Review of Systems  Constitutional: Negative for chills and fever.  HENT: Negative for hearing loss.   Eyes: Negative for blurred vision.  Respiratory: Negative for cough and wheezing.   Cardiovascular: Negative for chest pain.  Gastrointestinal: Negative for abdominal pain.  Neurological: Negative for dizziness.  Psychiatric/Behavioral: Positive for hallucinations. Negative for suicidal ideas.   Blood pressure (!) 161/103, pulse (!) 120, temperature 98.8 F (37.1 C), temperature source Oral, resp. rate 16, SpO2 100 %. There is no height or weight on file to calculate BMI.  Musculoskeletal: Strength & Muscle Tone: within normal limits Gait & Station: normal Patient leans:  Backward   [facility] MSE Discharge Disposition for Follow up and Recommendations: Based on my evaluation the patient does not appear to have an emergency medical condition and can be discharged with resources and follow up care  in outpatient services for Medication Management  Schizophrenia - Recommend patient continue his current regimen and come back for walk-in appt at 7 am 10/22/2020. - Attempted to call Mrs. Wallis Bamberg 3 times w/ no pick up - Safe transport used to send patient back to group home  PGY-1 Bobbye Morton, MD 10/21/2020, 12:08 PM

## 2020-10-21 NOTE — ED Notes (Signed)
Patient received AVS with community resources. Patient received all belongings.

## 2020-10-21 NOTE — ED Notes (Signed)
Pt discharged to self care. Safe transport called to transport pt back to facility but pt vacated premises prior to safe transport arrival. MD made aware.

## 2020-10-21 NOTE — Discharge Instructions (Signed)
Patient is instructed to take all prescribed medications as recommended. Report any side effects or adverse reactions to your outpatient psychiatrist. Patient is instructed to abstain from alcohol and illegal drugs while on prescription medications. In the event of worsening symptoms, patient is instructed to call the crisis hotline, 911, or go to the nearest emergency department for evaluation and treatment.   

## 2020-10-21 NOTE — Progress Notes (Signed)
CSW made aware that patient vacated premises before being picked up by safe transport.  CSW called [facility] department to do a welfare check on patient and try to locate him. CSW provided a patient description.  Police will call this CSW with outcome of welfare check.   CSW also called patient group home staff, Mrs. Wallis Bamberg, and left voicemail to provide her with updated information.   Jermany Sundell, LCSW, LCAS Clincal Social Worker  [facility]

## 2020-10-21 NOTE — ED Notes (Signed)
Safe transport called 

## 2020-10-22 ENCOUNTER — Inpatient Hospital Stay (HOSPITAL_COMMUNITY)
Admission: EM | Admit: 2020-10-22 | Discharge: 2020-10-28 | DRG: 682 | Disposition: A | Discharge status: Home / Self Care | Payer: Medicaid Other | Attending: Internal Medicine | Admitting: Internal Medicine

## 2020-10-22 DIAGNOSIS — F200 Paranoid schizophrenia: Secondary | ICD-10-CM | POA: Diagnosis present

## 2020-10-22 DIAGNOSIS — N179 Acute kidney failure, unspecified: Principal | ICD-10-CM | POA: Diagnosis present

## 2020-10-22 DIAGNOSIS — Z20822 Contact with and (suspected) exposure to covid-19: Secondary | ICD-10-CM | POA: Diagnosis present

## 2020-10-22 DIAGNOSIS — G9341 Metabolic encephalopathy: Secondary | ICD-10-CM | POA: Diagnosis present

## 2020-10-22 DIAGNOSIS — I10 Essential (primary) hypertension: Secondary | ICD-10-CM | POA: Diagnosis present

## 2020-10-22 DIAGNOSIS — R509 Fever, unspecified: Secondary | ICD-10-CM

## 2020-10-22 DIAGNOSIS — R45850 Homicidal ideations: Secondary | ICD-10-CM | POA: Diagnosis present

## 2020-10-22 DIAGNOSIS — Z888 Allergy status to other drugs, medicaments and biological substances status: Secondary | ICD-10-CM

## 2020-10-22 DIAGNOSIS — Z880 Allergy status to penicillin: Secondary | ICD-10-CM

## 2020-10-22 DIAGNOSIS — Z5320 Procedure and treatment not carried out because of patient's decision for unspecified reasons: Secondary | ICD-10-CM | POA: Diagnosis not present

## 2020-10-22 DIAGNOSIS — K219 Gastro-esophageal reflux disease without esophagitis: Secondary | ICD-10-CM | POA: Diagnosis present

## 2020-10-22 DIAGNOSIS — R569 Unspecified convulsions: Secondary | ICD-10-CM | POA: Diagnosis present

## 2020-10-22 DIAGNOSIS — E785 Hyperlipidemia, unspecified: Secondary | ICD-10-CM | POA: Diagnosis present

## 2020-10-22 DIAGNOSIS — R4689 Other symptoms and signs involving appearance and behavior: Secondary | ICD-10-CM

## 2020-10-22 DIAGNOSIS — Z79899 Other long term (current) drug therapy: Secondary | ICD-10-CM

## 2020-10-22 DIAGNOSIS — F17210 Nicotine dependence, cigarettes, uncomplicated: Secondary | ICD-10-CM | POA: Diagnosis present

## 2020-10-22 DIAGNOSIS — E872 Acidosis: Secondary | ICD-10-CM | POA: Diagnosis present

## 2020-10-22 DIAGNOSIS — E119 Type 2 diabetes mellitus without complications: Secondary | ICD-10-CM

## 2020-10-22 DIAGNOSIS — Z794 Long term (current) use of insulin: Secondary | ICD-10-CM

## 2020-10-22 DIAGNOSIS — F209 Schizophrenia, unspecified: Secondary | ICD-10-CM

## 2020-10-22 DIAGNOSIS — E860 Dehydration: Secondary | ICD-10-CM | POA: Diagnosis present

## 2020-10-22 DIAGNOSIS — Z87442 Personal history of urinary calculi: Secondary | ICD-10-CM

## 2020-10-22 LAB — CBC
HCT: 39.2 % (ref 39.0–52.0)
Hemoglobin: 12.6 g/dL — ABNORMAL LOW (ref 13.0–17.0)
MCH: 27.5 pg (ref 26.0–34.0)
MCHC: 32.1 g/dL (ref 30.0–36.0)
MCV: 85.4 fL (ref 80.0–100.0)
Platelets: 151 10*3/uL (ref 150–400)
RBC: 4.59 MIL/uL (ref 4.22–5.81)
RDW: 14.7 % (ref 11.5–15.5)
WBC: 7.6 10*3/uL (ref 4.0–10.5)
nRBC: 0 % (ref 0.0–0.2)

## 2020-10-22 LAB — COMPREHENSIVE METABOLIC PANEL
ALT: 19 U/L (ref 0–44)
AST: 46 U/L — ABNORMAL HIGH (ref 15–41)
Albumin: 3.5 g/dL (ref 3.5–5.0)
Alkaline Phosphatase: 65 U/L (ref 38–126)
Anion gap: 13 (ref 5–15)
BUN: 66 mg/dL — ABNORMAL HIGH (ref 6–20)
CO2: 18 mmol/L — ABNORMAL LOW (ref 22–32)
Calcium: 8.9 mg/dL (ref 8.9–10.3)
Chloride: 113 mmol/L — ABNORMAL HIGH (ref 98–111)
Creatinine, Ser: 3.55 mg/dL — ABNORMAL HIGH (ref 0.61–1.24)
GFR, Estimated: 19 mL/min — ABNORMAL LOW (ref >60)
Glucose, Bld: 99 mg/dL (ref 70–99)
Potassium: 4.4 mmol/L (ref 3.5–5.1)
Sodium: 144 mmol/L (ref 135–145)
Total Bilirubin: 1 mg/dL (ref 0.3–1.2)
Total Protein: 6.2 g/dL — ABNORMAL LOW (ref 6.5–8.1)

## 2020-10-22 LAB — RESP PANEL BY RT-PCR (FLU A&B, COVID) ARPGX2
Influenza A by PCR: NEGATIVE
Influenza B by PCR: NEGATIVE
SARS Coronavirus 2 by RT PCR: NEGATIVE

## 2020-10-22 LAB — ETHANOL: Alcohol, Ethyl (B): <10 mg/dL (ref <10)

## 2020-10-22 MED ORDER — QUETIAPINE FUMARATE 200 MG PO TABS: 200.0000 mg | ORAL_TABLET | Freq: Two times a day (BID) | ORAL | Status: DC

## 2020-10-22 MED ORDER — DIVALPROEX SODIUM 250 MG PO DR TAB: 250.0000 mg | DELAYED_RELEASE_TABLET | ORAL | Status: DC

## 2020-10-22 MED ORDER — SODIUM CHLORIDE 0.9 % IV BOLUS
1000.0000 mL | Freq: Once | INTRAVENOUS | Status: AC
  Administered 2020-10-22: 1000 mL via INTRAVENOUS

## 2020-10-22 MED ORDER — LACTATED RINGERS IV BOLUS
1000.0000 mL | Freq: Once | INTRAVENOUS | Status: AC
  Administered 2020-10-23: 1000 mL via INTRAVENOUS

## 2020-10-22 MED ORDER — INSULIN GLARGINE 100 UNIT/ML ~~LOC~~ SOLN: 25.0000 [IU] | Freq: Every day | SUBCUTANEOUS | Status: DC

## 2020-10-22 MED ORDER — DAPAGLIFLOZIN PROPANEDIOL 10 MG PO TABS: 10.0000 mg | ORAL_TABLET | Freq: Every day | ORAL | Status: DC

## 2020-10-22 MED ORDER — DIVALPROEX SODIUM 125 MG PO DR TAB: 125.0000 mg | DELAYED_RELEASE_TABLET | Freq: Every day | ORAL | Status: DC

## 2020-10-22 NOTE — ED Triage Notes (Signed)
Pt arrived via [facility] for medical evaluation. Pt eloped from group home and has been missing for 2 days.  Pt was found laying on the ground in a parking lot this evening. PMH of cognitive impairment, unspecific provided. Per pd, group home refused to reaccept without medical clearance. Pt does not endorse any complaints. Calm and cooperative upon arrival. BS 110.

## 2020-10-22 NOTE — ED Notes (Signed)
Manager at [facility] spoken with regarding pt. Manager reported increasing behavioral issues x1 week (agitation, elopement, concerning and threatening behavior). Facility attempted to access outpatient help for these issues when pt eloped after appointment. Manager reported that her facility is not equipped to provide the level of care she feels he needs and he is not able to return prior to behavior health reassessment and resolution of behavioral issues. MD made aware of conversation.

## 2020-10-22 NOTE — ED Provider Notes (Addendum)
[facility] EMERGENCY DEPARTMENT Provider Note   CSN: [id number] Arrival date & time: 10/22/20  2100     History Chief Complaint  Patient presents with  . Medical Clearance    Ronald Roman is a 60 y.o. male. Level 5 caveat due to psychiatric disorder. HPI Reported brought in by police.  Reportedly has been missing from group home for 2 days but appears to been seen at [facility] yesterday.  May of eloped during this outing.  Reportedly has not been coming back.  Reportedly has been making threatening statements at the nursing home.  Increasing erratic activity.  There is some mention of this in the behavioral health notes but had been planned to follow-up with PCP.  However has been refusing to stay at the group home.  Likely not taking medicines.  Patient really cannot provide any history at this point.  States that he has been at work and that he works "everywhere".    Past Medical History:  Diagnosis Date  . Diabetes mellitus without complication (HCC)   . GERD (gastroesophageal reflux disease)   . Hyperlipidemia   . Hypertension   . Paranoid schizophrenia (HCC)   . Sleep apnea   . Uric acid nephrolithiasis     Patient Active Problem List   Diagnosis Date Noted  . Acute metabolic encephalopathy 10/13/2020  . Unresponsiveness   . Aggressive behavior   . Seizure (HCC) 05/07/2020  . AMS (altered mental status) 02/09/2020  . Schizophrenia (HCC)   . Leukocytosis 02/06/2015  . Sleep apnea 02/05/2015  . AKI (acute kidney injury) (HCC) 02/04/2015  . Seizure disorder (HCC) 02/04/2015  . Acute encephalopathy 02/04/2015  . Diabetes mellitus without complication (HCC)   . Hyperlipidemia   . Hypertension   . Paranoid schizophrenia (HCC)     No past surgical history on file.     No family history on file.  Social History   Tobacco Use  . Smoking status: Current Every Day Smoker    Packs/day: 1.00    Types: Cigarettes  . Smokeless  tobacco: Never Used  Substance Use Topics  . Alcohol use: No  . Drug use: No    Home Medications Prior to Admission medications   Medication Sig Start Date End Date Taking? Authorizing Provider  albuterol (PROVENTIL HFA;VENTOLIN HFA) 108 (90 BASE) MCG/ACT inhaler Inhale 2 puffs into the lungs every 4 (four) hours as needed for wheezing or shortness of breath. Patient not taking: No sig reported 04/21/15   Hayden Rasmussen, NP  amantadine (SYMMETREL) 100 MG capsule Take 1 capsule (100 mg total) by mouth 2 (two) times daily. 04/26/16   Oneta Rack, NP  dapagliflozin propanediol (FARXIGA) 10 MG TABS tablet Take 10 mg by mouth daily after breakfast.    [provider]  DEPAKOTE 250 MG DR tablet Take 250 mg by mouth See admin instructions. Take one at 12PM and one at bedtime. 09/23/20   [provider]  divalproex (DEPAKOTE) 125 MG DR tablet Take 1 tablet (125 mg total) by mouth daily with breakfast. 10/15/20   Edsel Petrin, DO  hydrOXYzine (ATARAX/VISTARIL) 25 MG tablet Take 25 mg by mouth 4 (four) times daily. 10/17/19   [provider]  insulin glargine (LANTUS) 100 UNIT/ML injection Inject 0.2 mLs (20 Units total) into the skin at bedtime. Patient taking differently: Inject 25 Units into the skin at bedtime. 04/26/16   Oneta Rack, NP  lisinopril (ZESTRIL) 20 MG tablet TAKE 1/2 TABLET (  10 MG TOTAL) BY MOUTH DAILY. Patient taking differently: Take 20 mg by mouth daily. 05/08/20 05/08/21  Sabino Dick, DO  magnesium oxide (MAG-OX) 400 (240 Mg) MG tablet Take 1 tablet (400 mg total) by mouth daily. 10/14/20   Pokhrel, Rebekah Chesterfield, MD  metFORMIN (GLUCOPHAGE) 1000 MG tablet Take 1,000 mg by mouth 2 (two) times daily with a meal.    [provider]  QUEtiapine (SEROQUEL) 200 MG tablet Take 1 tablet (200 mg total) by mouth once for 1 dose. 10/20/20 10/20/20  Bobbye Morton, MD  traZODone (DESYREL) 50 MG tablet Take 50-100 mg by mouth at bedtime.    [provider]  zolpidem (AMBIEN) 5 MG tablet Take 5 mg by mouth at bedtime.    [provider]    Allergies    Cogentin [benztropine], Penicillins, and Prolixin [fluphenazine]  Review of Systems   Review of Systems  Unable to perform ROS: Psychiatric disorder    Physical Exam Updated Vital Signs BP 126/86   Pulse (!) 102   Temp 98.7 F (37.1 C) (Oral)   Resp (!) 27   SpO2 96%   Physical Exam Vitals and nursing note reviewed.  HENT:     Head: Normocephalic.     Mouth/Throat:     Mouth: Mucous membranes are moist.  Eyes:     Pupils: Pupils are equal, round, and reactive to light.  Cardiovascular:     Rate and Rhythm: Tachycardia present.  Pulmonary:     Breath sounds: No wheezing or rhonchi.  Abdominal:     Tenderness: There is no abdominal tenderness.  Musculoskeletal:        General: No tenderness.     Cervical back: Neck supple.  Skin:    General: Skin is warm.     Capillary Refill: Capillary refill takes less than 2 seconds.  Neurological:     Mental Status: He is alert.     Comments: Confusion.  Some nonsensical words and also pressured speech.  Moving all extremities.  Psychiatric:     Comments: Pressured speech.     ED Results / Procedures / Treatments   Labs (all labs ordered are listed, but only abnormal results are displayed) Labs Reviewed  CBC - Abnormal; Notable for the following components:      Result Value   Hemoglobin 12.6 (*)    All other components within normal limits  COMPREHENSIVE METABOLIC PANEL - Abnormal; Notable for the following components:   Chloride 113 (*)    CO2 18 (*)    BUN 66 (*)    Creatinine, Ser 3.55 (*)    Total Protein 6.2 (*)    AST 46 (*)    GFR, Estimated 19 (*)    All other components within normal limits  RESP PANEL BY RT-PCR (FLU A&B, COVID) ARPGX2  ETHANOL  RAPID URINE DRUG SCREEN, HOSP PERFORMED  URINALYSIS, ROUTINE W REFLEX MICROSCOPIC    EKG EKG Interpretation  Date/Time:  Wednesday October 22 2020 21:03:17 EDT Ventricular Rate:  115 PR Interval:    QRS Duration: 79 QT Interval:  301 QTC Calculation: 417 R Axis:   97 Text Interpretation: Sinus tachycardia Borderline right axis deviation Confirmed by Benjiman Core [id number]) on 10/22/2020 11:34:58 PM   Radiology No results found.  Procedures Procedures   Medications Ordered in ED Medications  lactated ringers bolus 1,000 mL (has no administration in time range)  sodium chloride 0.9 % bolus 1,000 mL (1,000 mLs Intravenous New Bag/Given 10/22/20 2135)  ED Course  I have reviewed the triage vital signs and the nursing notes.  Pertinent labs & imaging results that were available during my care of the patient were reviewed by me and considered in my medical decision making (see chart for details).    MDM Rules/Calculators/A&P                          Patient presents with schizophrenia.  Reportedly has been missing from his group home.  Brought in for medical clearance but nursing and discussed with group home and says he is not welcome back because he has been eloping and a threat to others.  Not taking his medicine.  2 recent visits to behavioral health urgent care and has recent ER visits also including 1 where he had overdosed.    CMP is now resulted and shows a new acute kidney injury with creatinine 3.5.  With this required mission to the hospital.  We will add postvoid residual.  Will discuss with hospitalist. Final Clinical Impression(s) / ED Diagnoses Final diagnoses:  Schizophrenia, unspecified type (HCC)  AKI (acute kidney injury) [facility])    Rx / DC Orders ED Discharge Orders    None       Benjiman Core, MD 10/22/20 Criss Rosales    Benjiman Core, MD 10/22/20 2351

## 2020-10-22 NOTE — ED Notes (Signed)
Pt in bed, from outside of room patient can be seen occasionally thrashing and rolling in bed yelling nonsensical words/in coherent sentences. When asked if he was okay, pt replied "yes, just taking care of business" and quieted down for a few moments.

## 2020-10-23 DIAGNOSIS — F209 Schizophrenia, unspecified: Secondary | ICD-10-CM

## 2020-10-23 DIAGNOSIS — N179 Acute kidney failure, unspecified: Secondary | ICD-10-CM | POA: Diagnosis not present

## 2020-10-23 DIAGNOSIS — F2089 Other schizophrenia: Secondary | ICD-10-CM | POA: Diagnosis not present

## 2020-10-23 LAB — CBC
HCT: 35.9 % — ABNORMAL LOW (ref 39.0–52.0)
Hemoglobin: 11.6 g/dL — ABNORMAL LOW (ref 13.0–17.0)
MCH: 27.4 pg (ref 26.0–34.0)
MCHC: 32.3 g/dL (ref 30.0–36.0)
MCV: 84.7 fL (ref 80.0–100.0)
Platelets: 262 10*3/uL (ref 150–400)
RBC: 4.24 MIL/uL (ref 4.22–5.81)
RDW: 14.6 % (ref 11.5–15.5)
WBC: 12.8 10*3/uL — ABNORMAL HIGH (ref 4.0–10.5)
nRBC: 0 % (ref 0.0–0.2)

## 2020-10-23 LAB — CREATININE, SERUM
Creatinine, Ser: 2.66 mg/dL — ABNORMAL HIGH (ref 0.61–1.24)
GFR, Estimated: 27 mL/min — ABNORMAL LOW (ref >60)

## 2020-10-23 LAB — PHOSPHORUS: Phosphorus: 4.9 mg/dL — ABNORMAL HIGH (ref 2.5–4.6)

## 2020-10-23 LAB — MAGNESIUM: Magnesium: 1.9 mg/dL (ref 1.7–2.4)

## 2020-10-23 MED ORDER — ENOXAPARIN SODIUM 30 MG/0.3ML IJ SOSY
30.0000 mg | PREFILLED_SYRINGE | INTRAMUSCULAR | Status: DC
  Administered 2020-10-23 – 2020-10-28 (×5): 30 mg via SUBCUTANEOUS
  Filled 2020-10-23 (×5): qty 0.3

## 2020-10-23 MED ORDER — MELATONIN 3 MG PO TABS: 3.0000 mg | ORAL_TABLET | Freq: Every evening | ORAL | Status: DC | PRN

## 2020-10-23 MED ORDER — ONDANSETRON HCL 4 MG/2ML IJ SOLN: 4.0000 mg | Freq: Four times a day (QID) | INTRAMUSCULAR | Status: DC | PRN

## 2020-10-23 MED ORDER — ACETAMINOPHEN 500 MG PO TABS: 500.0000 mg | ORAL_TABLET | Freq: Four times a day (QID) | ORAL | Status: DC | PRN

## 2020-10-23 MED ORDER — QUETIAPINE FUMARATE 200 MG PO TABS
200.0000 mg | ORAL_TABLET | Freq: Every day | ORAL | Status: DC
  Administered 2020-10-23: 200 mg via ORAL
  Filled 2020-10-23: qty 1

## 2020-10-23 MED ORDER — LACTATED RINGERS IV SOLN: INTRAVENOUS | Status: AC

## 2020-10-23 MED ORDER — HYDROXYZINE HCL 25 MG PO TABS
25.0000 mg | ORAL_TABLET | Freq: Once | ORAL | Status: AC
  Administered 2020-10-23: 25 mg via ORAL
  Filled 2020-10-23: qty 1

## 2020-10-23 MED ORDER — QUETIAPINE FUMARATE 100 MG PO TABS
200.0000 mg | ORAL_TABLET | Freq: Two times a day (BID) | ORAL | Status: DC
  Filled 2020-10-23: qty 1

## 2020-10-23 MED ORDER — OLANZAPINE 5 MG PO TBDP
5.0000 mg | ORAL_TABLET | Freq: Once | ORAL | Status: AC
  Administered 2020-10-23: 5 mg via ORAL
  Filled 2020-10-23: qty 1

## 2020-10-23 NOTE — ED Notes (Signed)
Patient sitting up on the side of the bed eating breakfast however he is talking to himself, states "I paid in full yest and I want a real nurse taking care of me a supervisor , "

## 2020-10-23 NOTE — ED Notes (Signed)
Patient got up to go to the bathroom and pull his IV out. IV team called and patient refused to let IV RN or primary RN start IV.

## 2020-10-23 NOTE — Progress Notes (Signed)
Patient has r this tie refused IV restart at this time.

## 2020-10-23 NOTE — ED Notes (Signed)
RN attempted report x1.  

## 2020-10-23 NOTE — ED Notes (Signed)
Pt is walking around room eating his lunch. Pt refused vital signs. Pt will not sit in bed. Pt is currently sitting in a chair in the bathroom. MD notified.

## 2020-10-23 NOTE — ED Notes (Signed)
Pt is currently in bed sleeping

## 2020-10-23 NOTE — Progress Notes (Signed)
PROGRESS NOTE    Ronald Roman  TZG:[id number] DOB: [birth year] DOA: 10/22/2020 PCP: System, Provider Not In   Chief Complaint  Patient presents with  . Medical Clearance    Brief Narrative:  Ronald Roman is Ronald Roman 60 y.o. male with medical history significant for schizophrenia who presented to [facility] ED after being picked up on the street by [facility] police due to abnormal behavior.  History is mainly obtained from EDP and from review of medical records.  At the time of this visit, patient suffers from altered mental status.  Per collateral history, patient has been missing from group home for 2 days.  Per EDP there were homicidal threats made and patient had erratic behaviors.  Patient was likely not taking his medications.  Due to abnormal behavior he was picked up on the street and brought in by the [facility] police department to [facility] ED.  Clearance labs were obtained which revealed AKI likely prerenal in the setting of dehydration.  TRH, hospitalist team, was asked to admit for management of AKI.  ED Course:  Afebrile.  BP 111/71, pulse 103, respiration rate 20, O2 saturation 94% on room air.  Lab studies remarkable for BUN 66, creatinine 3.55, serum bicarb 18, anion gap 13.  AST 46, ALT 19, T bilirubin 1.0, alkaline phosphatase 65.  Assessment & Plan:   Active Problems:   AKI (acute kidney injury) (HCC)   AKI, likely prerenal in the setting of dehydration. Baseline creatinine appears to be 1.1 Presenting creatinine 3.5 Improved to 2.6 today Lost IV, he's refusing IV at this time - encourage PO hydration and follow as able Renal US without hydro Monitor urine output Avoid nephrotoxic agents, dehydration and hypotension.  Psychosis  Schizophrenia  Suspect patient was not taking his medications - apparently for at least 2 days Appreciate psychiatry assistance - recommending BID seroquel - gave 1 x dose of zyprexa, will follow Safety sitter ordered Fall/aspiration/delirium  precautions.  Non anion gap metabolic acidosis likely secondary to acute renal insufficiency. Presented with serum bicarb 18, anion gap 13 Follow   Isolated AST AST to ALT 2:1 ratio, obtain alcohol level (negative)  Schizophrenia Appears uncontrolled Unclear if he was taking his medications. Was living at [facility] Roman group home up until 2 days ago Per EDP, report of homicidal threats made Psychiatry consult, appreciate assistance   DVT prophylaxis: lovenox Code Status: full  Family Communication: none at bedside Disposition:   Status is: Observation  The patient will require care spanning > 2 midnights and should be moved to inpatient because: Inpatient level of care appropriate due to severity of illness  Dispo:  Patient From: Group Home  Planned Disposition: Group Home  Medically stable for discharge: No      Consultants:   psych  Procedures:  none  Antimicrobials:  Anti-infectives (From admission, onward)   None        Subjective: Nonsensical speech  Objective: Vitals:   10/23/20 0100 10/23/20 0130 10/23/20 0215 10/23/20 0629  BP: 117/71 111/71 109/81 111/79  Pulse: (!) 102 96 90 97  Resp: 20 20 20 18   Temp:    97.9 F (36.6 C)  TempSrc:    Oral  SpO2: 98% 99% 94% 100%   No intake or output data in the 24 hours ending 10/23/20 1922 There were no vitals filed for this visit.  Examination:  Refuses exam General exam: Appears agitated Respiratory system: unlabored Cardiovascular system: doesn't let me examine  Gastrointestinal system: protuberant Central nervous system: moving  all extremities Extremities: no visible edema Psychiatry: Judgement and insight appear impaired, appears to respond to internal stimuli, talking to himself    Data Reviewed: I have personally reviewed following labs and imaging studies  CBC: Recent Labs  Lab 10/22/20 2114 10/23/20 0220  WBC 7.6 12.8*  HGB 12.6* 11.6*  HCT 39.2 35.9*  MCV 85.4 84.7  PLT 151 262     Basic Metabolic Panel: Recent Labs  Lab 10/22/20 2232 10/23/20 0220  NA 144  --   K 4.4  --   CL 113*  --   CO2 18*  --   GLUCOSE 99  --   BUN 66*  --   CREATININE 3.55* 2.66*  CALCIUM 8.9  --     GFR: CrCl cannot be calculated (Unknown ideal weight.).  Liver Function Tests: Recent Labs  Lab 10/22/20 2232  AST 46*  ALT 19  ALKPHOS 65  BILITOT 1.0  PROT 6.2*  ALBUMIN 3.5    CBG: No results for input(s): GLUCAP in the last 168 hours.   Recent Results (from the past 240 hour(s))  Resp Panel by RT-PCR (Flu Francisca Harbuck&B, Covid) Nasopharyngeal Swab     Status: None   Collection Time: 10/22/20  9:14 PM   Specimen: Nasopharyngeal Swab; Nasopharyngeal(NP) swabs in vial transport medium  Result Value Ref Range Status   SARS Coronavirus 2 by RT PCR NEGATIVE NEGATIVE Final    Comment: (NOTE) SARS-CoV-2 target nucleic acids are NOT DETECTED.  The SARS-CoV-2 RNA is generally detectable in upper respiratory specimens during the acute phase of infection. The lowest concentration of SARS-CoV-2 viral copies this assay can detect is 138 copies/mL. Neala Miggins negative result does not preclude SARS-Cov-2 infection and should not be used as the sole basis for treatment or other patient management decisions. Ren Grasse negative result may occur with  improper specimen collection/handling, submission of specimen other than nasopharyngeal swab, presence of viral mutation(s) within the areas targeted by this assay, and inadequate number of viral copies(<138 copies/mL). Tyrea Froberg negative result must be combined with clinical observations, patient history, and epidemiological information. The expected result is Negative.  Fact Sheet for Patients:  BloggerCourse.com  Fact Sheet for Healthcare Providers:  SeriousBroker.it  This test is no t yet approved or cleared by the [place] FDA and  has been authorized for detection and/or diagnosis of SARS-CoV-2  by FDA under an Emergency Use Authorization (EUA). This EUA will remain  in effect (meaning this test can be used) for the duration of the COVID-19 declaration under Section 564(b)(1) of the Act, 21 U.S.C.section 360bbb-3(b)(1), unless the authorization is terminated  or revoked sooner.       Influenza Graiden Henes by PCR NEGATIVE NEGATIVE Final   Influenza B by PCR NEGATIVE NEGATIVE Final    Comment: (NOTE) The Xpert Xpress SARS-CoV-2/FLU/RSV plus assay is intended as an aid in the diagnosis of influenza from Nasopharyngeal swab specimens and should not be used as Glover Capano sole basis for treatment. Nasal washings and aspirates are unacceptable for Xpert Xpress SARS-CoV-2/FLU/RSV testing.  Fact Sheet for Patients: BloggerCourse.com  Fact Sheet for Healthcare Providers: SeriousBroker.it  This test is not yet approved or cleared by the [place] FDA and has been authorized for detection and/or diagnosis of SARS-CoV-2 by FDA under an Emergency Use Authorization (EUA). This EUA will remain in effect (meaning this test can be used) for the duration of the COVID-19 declaration under Section 564(b)(1) of the Act, 21 U.S.C. section 360bbb-3(b)(1), unless the authorization is terminated or revoked.  Performed at [facility], [address], [place], [name] [address]          Radiology Studies: No results found.      Scheduled Meds: . enoxaparin (LOVENOX) injection  30 mg Subcutaneous Q24H  . QUEtiapine  200 mg Oral BID   Continuous Infusions: . lactated ringers Stopped (10/23/20 1010)     LOS: 0 days    Time spent: over 30 min    Lacretia Nicks, MD [facility]   To contact the attending provider between 7A-7P or the covering provider during after hours 7P-7A, please log into the web site [email or web address] and access using universal K[place] password for that web site. If you do not have the password, please  call the hospital operator.  10/23/2020, 7:22 PM

## 2020-10-23 NOTE — Progress Notes (Addendum)
Pt refuses to come out of the bathroom, refusing any and all interventions including vitals, labs and telemetry, cursing and being rude towards staff and refusing medications.   MD on call paged and updated on pt's behavior. Security notified to come to bedside to get pt out of the restroom.  2234: Security at bedside, stating if pt not IVC'd they can't physically touch him, but they were able to open the restroom door for this RN to visualize pt. Pt very flighty just standing in the mirror in hospital gown rubbing his head. Pt states, "you're not my nurse, Dareen Piano is. And you don't own me." Attempts at reorientation unsuccessful. Pt's room and restroom checked for anything pt may use to harm himself or others. Will continue to monitor pt as pt allows, while maintaining my own physical safety.

## 2020-10-23 NOTE — ED Notes (Signed)
Pt is asleep in bed.

## 2020-10-23 NOTE — Plan of Care (Signed)
Notified by pt's primary nurse, pt refusing meds and not coming out of the bathroom. Called and spoke with RN and per RN, can partially see that pt is okay but cannot see full view of the pt. Asked her to get security involved since per RN currently unable to get a Recruitment consultant. Also asked RN to remove any item in pt's room and surroundings that pt can use to harm himself or others.

## 2020-10-23 NOTE — H&P (Signed)
History and Physical  Ronald Roman CLE:[id number] DOB: Jan 30, 1961 DOA: 10/22/2020  Referring physician: Dr. Erin Hearing, EDP PCP: System, Provider Not In  Outpatient Specialists: Psychiatry. Patient coming from: Picked up on the street by [facility] police  Chief Complaint: Brought in by police due to abnormal behavior.  HPI: Ronald Roman is a 60 y.o. male with medical history significant for schizophrenia who presented to [facility] ED after being picked up on the street by [facility] police due to abnormal behavior.  History is mainly obtained from EDP and from review of medical records.  At the time of this visit, patient suffers from altered mental status.  Per collateral history, patient has been missing from group home for 2 days.  Per EDP there were homicidal threats made and patient had erratic behaviors.  Patient was likely not taking his medications.  Due to abnormal behavior he was picked up on the street and brought in by the [facility] police department to [facility] ED.  Clearance labs were obtained which revealed AKI likely prerenal in the setting of dehydration.  TRH, hospitalist team, was asked to admit for management of AKI.  ED Course:  Afebrile.  BP 111/71, pulse 103, respiration rate 20, O2 saturation 94% on room air.  Lab studies remarkable for BUN 66, creatinine 3.55, serum bicarb 18, anion gap 13.  AST 46, ALT 19, T bilirubin 1.0, alkaline phosphatase 65.  Review of Systems: Review of systems as noted in the HPI. All other systems reviewed and are negative.   Past Medical History:  Diagnosis Date  . Diabetes mellitus without complication (HCC)   . GERD (gastroesophageal reflux disease)   . Hyperlipidemia   . Hypertension   . Paranoid schizophrenia (HCC)   . Sleep apnea   . Uric acid nephrolithiasis    No past surgical history on file.  Social History:  reports that he has been smoking cigarettes. He has been smoking about 1.00 pack per day. He has never used smokeless  tobacco. He reports that he does not drink alcohol and does not use drugs.   Allergies  Allergen Reactions  . Cogentin [Benztropine] Other (See Comments)    Per MAR   . Penicillins Other (See Comments)    Per MAR - unable to verify patients PCN reaction   . Prolixin [Fluphenazine] Other (See Comments)    Per MAR     No family history on file.  Unable to obtain a reliable history from the patient in the setting of schizophrenia and altered mental status.  Prior to Admission medications   Medication Sig Start Date End Date Taking? Authorizing Provider  albuterol (PROVENTIL HFA;VENTOLIN HFA) 108 (90 BASE) MCG/ACT inhaler Inhale 2 puffs into the lungs every 4 (four) hours as needed for wheezing or shortness of breath. 04/21/15  Yes Mabe, Onalee Hua, NP  amantadine (SYMMETREL) 100 MG capsule Take 1 capsule (100 mg total) by mouth 2 (two) times daily. 04/26/16  Yes Oneta Rack, NP  dapagliflozin propanediol (FARXIGA) 10 MG TABS tablet Take 10 mg by mouth daily after breakfast.   Yes [provider]  DEPAKOTE 250 MG DR tablet Take 250 mg by mouth See admin instructions. Take one at 12PM and one at bedtime. 09/23/20  Yes [provider]  divalproex (DEPAKOTE) 125 MG DR tablet Take 1 tablet (125 mg total) by mouth daily with breakfast. 10/15/20  Yes Mikhail, Piney, DO  hydrOXYzine (ATARAX/VISTARIL) 25 MG tablet Take 25 mg by mouth 4 (four) times daily. 10/17/19  Yes  [provider]  insulin glargine (LANTUS) 100 UNIT/ML injection Inject 0.2 mLs (20 Units total) into the skin at bedtime. Patient taking differently: Inject 25 Units into the skin at bedtime. 04/26/16  Yes Oneta Rack, NP  lisinopril (ZESTRIL) 20 MG tablet TAKE 1/2 TABLET (10 MG TOTAL) BY MOUTH DAILY. Patient taking differently: Take 20 mg by mouth daily. 05/08/20 05/08/21 Yes Espinoza, Myrlene Broker, DO  magnesium oxide (MAG-OX) 400 (240 Mg) MG tablet Take 1 tablet (400 mg total) by mouth daily. 10/14/20  Yes  Pokhrel, Laxman, MD  metFORMIN (GLUCOPHAGE) 1000 MG tablet Take 1,000 mg by mouth 2 (two) times daily with a meal.   Yes [provider]  QUEtiapine (SEROQUEL) 200 MG tablet Take 1 tablet (200 mg total) by mouth once for 1 dose. 10/20/20 10/20/20 Yes Bobbye Morton, MD  traZODone (DESYREL) 50 MG tablet Take 50-100 mg by mouth at bedtime.   Yes [provider]  zolpidem (AMBIEN) 5 MG tablet Take 5 mg by mouth at bedtime.   Yes [provider]    Physical Exam: BP 111/71   Pulse 96   Temp 98.7 F (37.1 C) (Oral)   Resp 20   SpO2 99%   . General: 60 y.o. year-old male well developed well nourished in no acute distress.  Alert and confused. . Cardiovascular: Regular rate and rhythm with no rubs or gallops.  No thyromegaly or JVD noted.  No lower extremity edema. 2/4 pulses in all 4 extremities. Marland Kitchen Respiratory: Clear to auscultation with no wheezes or rales.  Poor inspiratory effort. . Abdomen: Soft nontender nondistended with normal bowel sounds x4 quadrants. . Muskuloskeletal: No cyanosis, clubbing or edema noted bilaterally . Neuro: CN II-XII intact, strength, sensation, reflexes . Skin: No ulcerative lesions noted or rashes . Psychiatry: Judgement and insight appear altered. Mood is appropriate for condition and setting          Labs on Admission:  Basic Metabolic Panel: Recent Labs  Lab 10/22/20 2232  NA 144  K 4.4  CL 113*  CO2 18*  GLUCOSE 99  BUN 66*  CREATININE 3.55*  CALCIUM 8.9   Liver Function Tests: Recent Labs  Lab 10/22/20 2232  AST 46*  ALT 19  ALKPHOS 65  BILITOT 1.0  PROT 6.2*  ALBUMIN 3.5   No results for input(s): LIPASE, AMYLASE in the last 168 hours. No results for input(s): AMMONIA in the last 168 hours. CBC: Recent Labs  Lab 10/22/20 2114  WBC 7.6  HGB 12.6*  HCT 39.2  MCV 85.4  PLT 151   Cardiac Enzymes: No results for input(s): CKTOTAL, CKMB, CKMBINDEX, TROPONINI in the last 168 hours.  BNP (last 3  results) Recent Labs    05/07/20 1028  BNP 54.5    ProBNP (last 3 results) No results for input(s): PROBNP in the last 8760 hours.  CBG: No results for input(s): GLUCAP in the last 168 hours.  Radiological Exams on Admission: No results found.  EKG: I independently viewed the EKG done and my findings are as followed: Sinus tachycardia rate of 115, nonspecific ST-T changes.  QTc 417.  Assessment/Plan Present on Admission: . AKI (acute kidney injury) (HCC)  Active Problems:   AKI (acute kidney injury) (HCC)  AKI, likely prerenal in the setting of dehydration. Baseline creatinine appears to be 1.1 with GFR greater than 60. Presented with creatinine of 3.55 with GFR of 19. Start IV fluid hydration Monitor urine output Avoid nephrotoxic agents, dehydration and hypotension.  Acute  metabolic encephalopathy suspect multifactorial secondary to uncontrolled schizophrenia, likely untreated and dehydration from poor oral intake Suspect patient was not taking his medications Resume home medications once home meds have been reconciled. Treat underlying conditions. Reorient as needed Fall/aspiration/delirium precautions.  Anion gap metabolic acidosis likely secondary to acute renal insufficiency. Presented with serum bicarb 18, anion gap 13 IV fluid hydration Repeat renal panel in the morning.  Isolated AST AST to ALT 2:1 ratio, obtain alcohol level Repeat CMP in the morning  Schizophrenia Appears uncontrolled Unclear if he was taking his medications. Was living at a group home up until 2 days ago Per EDP, report of homicidal threats made Psychiatry consult.   DVT prophylaxis: Subcu Lovenox daily.  Code Status: Full code.  Family Communication: None at bedside.  Disposition Plan: Admit to MedSurg unit  Consults called: None.  Admission status: Observation status.   Status is: Observation    Dispo:  Patient From: Group Home  Planned Disposition: Group Home  or inpatient psych on 10/24/2020 once medically cleared and if approved by psych.  Medically stable for discharge: No      [name] MD [facility] Pager [phone]  If 7PM-7AM, please contact night-coverage [email or web address] Password [email or web address]  10/23/2020, 1:58 AM

## 2020-10-23 NOTE — Consult Note (Addendum)
[facility] Face-to-Face Psychiatry Consult   Reason for Consult:  Uncontrolled Schizophrenia Referring Physician:  Dow Adolph, DO Patient Identification: Ronald Roman MRN:  [id number] Principal Diagnosis: <principal problem not specified> Diagnosis:  Active Problems:   AKI (acute kidney injury) (HCC)   Total Time spent with patient: 20 minutes  Subjective:   Ronald Roman is a 60 y.o. male patient admitted with AKI after being reported missing 2 days ago. Patient presented via [facility] after they found him. Patient has a PPH of schizophrenia.  HPI:    Patient was last seen at the [facility] after being brought by his group home (who then left the patient)  were he was stable for discharge back to his group home; however unfortunately while waiting for safe transport in the lobby patient walked away. Patient was found 2 days later and brought to the ED.  Patient has not received 2 days of his Seroquel that has been recommended for his schizophrenia. 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 Patient talks to the provider as if he knew her mother or taught her and reports that he will only listen to the mother of the provider. Patient is not able to give an in depth history as he prefers to talk about his delusion and keeps asking provider "do you understand me?"  RN note that patient was saying similar statements to them regardless of RN age. RN noted that patient was not having any issues with behavior rather he just said strange things. Patient was noted to be eating fairly well. Patient was able to deny SI, HI, and AVH.   Second rounding: Provider revisited patient after seeing RN notes reporting patient was refusing IV excesses for treatment. Upon reassessment patient remained very delusional about being a Runner, broadcasting/film/video but was no longer willing to eat and preferred to talk to himself or to the provider in bed and did not  want to stop talking. Patient was also talking about hurting people with a sling shot but had no specific people in mind and was rambling.   Past Psychiatric History: Schizophrenia  Risk to Self:   NO  Risk to Others:   NO Prior Inpatient Therapy:   [facility] 2017, [facility] 5/30 and 10/21/2020 Prior Outpatient Therapy:  Yes in group home setting, recommended patient initiate new OP therapy at [facility]  Past Medical History:  Past Medical History:  Diagnosis Date  . Diabetes mellitus without complication (HCC)   . GERD (gastroesophageal reflux disease)   . Hyperlipidemia   . Hypertension   . Paranoid schizophrenia (HCC)   . Sleep apnea   . Uric acid nephrolithiasis    No past surgical history on file. Family History: No family history on file. Family Psychiatric  History: None known  Social History:  Social History   Substance and Sexual Activity  Alcohol Use No     Social History   Substance and Sexual Activity  Drug Use No    Social History   Socioeconomic History  . Marital status: Single    Spouse name: Not on file  . Number of children: Not on file  . Years of education: Not on file  . Highest education level: Not on file  Occupational History  . Not on file  Tobacco Use  . Smoking status: Current Every Day Smoker    Packs/day: 1.00    Types: Cigarettes  . Smokeless  tobacco: Never Used  Substance and Sexual Activity  . Alcohol use: No  . Drug use: No  . Sexual activity: Not Currently  Other Topics Concern  . Not on file  Social History Narrative  . Not on file   Social Determinants of Health   Financial Resource Strain: Not on file  Food Insecurity: Not on file  Transportation Needs: Not on file  Physical Activity: Not on file  Stress: Not on file  Social Connections: Not on file   Additional Social History:    Allergies:   Allergies  Allergen Reactions  . Cogentin [Benztropine] Other (See Comments)    Per MAR   . Penicillins Other (See Comments)     Per MAR - unable to verify patients PCN reaction   . Prolixin [Fluphenazine] Other (See Comments)    Per MAR     Labs:  Results for orders placed or performed during the hospital encounter of 10/22/20 (from the past 48 hour(s))  Ethanol     Status: None   Collection Time: 10/22/20  9:14 PM  Result Value Ref Range   Alcohol, Ethyl (B) <10 <10 mg/dL    Comment: (NOTE) Lowest detectable limit for serum alcohol is 10 mg/dL.  For medical purposes only. Performed at [facility], [address], [place], [name] [address]   CBC     Status: Abnormal   Collection Time: 10/22/20  9:14 PM  Result Value Ref Range   WBC 7.6 4.0 - 10.5 K/uL   RBC 4.59 4.22 - 5.81 MIL/uL   Hemoglobin 12.6 (L) 13.0 - 17.0 g/dL   HCT 94.5 03.8 - 88.2 %   MCV 85.4 80.0 - 100.0 fL   MCH 27.5 26.0 - 34.0 pg   MCHC 32.1 30.0 - 36.0 g/dL   RDW 80.0 34.9 - 17.9 %   Platelets 151 150 - 400 K/uL   nRBC 0.0 0.0 - 0.2 %    Comment: Performed at [facility], [address], [place], [name] [address]  Resp Panel by RT-PCR (Flu A&B, Covid) Nasopharyngeal Swab     Status: None   Collection Time: 10/22/20  9:14 PM   Specimen: Nasopharyngeal Swab; Nasopharyngeal(NP) swabs in vial transport medium  Result Value Ref Range   SARS Coronavirus 2 by RT PCR NEGATIVE NEGATIVE    Comment: (NOTE) SARS-CoV-2 target nucleic acids are NOT DETECTED.  The SARS-CoV-2 RNA is generally detectable in upper respiratory specimens during the acute phase of infection. The lowest concentration of SARS-CoV-2 viral copies this assay can detect is 138 copies/mL. A negative result does not preclude SARS-Cov-2 infection and should not be used as the sole basis for treatment or other patient management decisions. A negative result may occur with  improper specimen collection/handling, submission of specimen other than nasopharyngeal swab, presence of viral mutation(s) within the areas targeted by this assay, and inadequate  number of viral copies(<138 copies/mL). A negative result must be combined with clinical observations, patient history, and epidemiological information. The expected result is Negative.  Fact Sheet for Patients:  BloggerCourse.com  Fact Sheet for Healthcare Providers:  SeriousBroker.it  This test is no t yet approved or cleared by the [place] FDA and  has been authorized for detection and/or diagnosis of SARS-CoV-2 by FDA under an Emergency Use Authorization (EUA). This EUA will remain  in effect (meaning this test can be used) for the duration of the COVID-19 declaration under Section 564(b)(1) of the Act, 21 U.S.C.section 360bbb-3(b)(1), unless  the authorization is terminated  or revoked sooner.       Influenza A by PCR NEGATIVE NEGATIVE   Influenza B by PCR NEGATIVE NEGATIVE    Comment: (NOTE) The Xpert Xpress SARS-CoV-2/FLU/RSV plus assay is intended as an aid in the diagnosis of influenza from Nasopharyngeal swab specimens and should not be used as a sole basis for treatment. Nasal washings and aspirates are unacceptable for Xpert Xpress SARS-CoV-2/FLU/RSV testing.  Fact Sheet for Patients: BloggerCourse.com  Fact Sheet for Healthcare Providers: SeriousBroker.it  This test is not yet approved or cleared by the [place] FDA and has been authorized for detection and/or diagnosis of SARS-CoV-2 by FDA under an Emergency Use Authorization (EUA). This EUA will remain in effect (meaning this test can be used) for the duration of the COVID-19 declaration under Section 564(b)(1) of the Act, 21 U.S.C. section 360bbb-3(b)(1), unless the authorization is terminated or revoked.  Performed at [facility], [address], [place], [name] [address]   Comprehensive metabolic panel     Status: Abnormal   Collection Time: 10/22/20 10:32 PM  Result Value Ref Range    Sodium 144 135 - 145 mmol/L   Potassium 4.4 3.5 - 5.1 mmol/L   Chloride 113 (H) 98 - 111 mmol/L   CO2 18 (L) 22 - 32 mmol/L   Glucose, Bld 99 70 - 99 mg/dL    Comment: Glucose reference range applies only to samples taken after fasting for at least 8 hours.   BUN 66 (H) 6 - 20 mg/dL   Creatinine, Ser 6.57 (H) 0.61 - 1.24 mg/dL   Calcium 8.9 8.9 - 84.6 mg/dL   Total Protein 6.2 (L) 6.5 - 8.1 g/dL   Albumin 3.5 3.5 - 5.0 g/dL   AST 46 (H) 15 - 41 U/L   ALT 19 0 - 44 U/L   Alkaline Phosphatase 65 38 - 126 U/L   Total Bilirubin 1.0 0.3 - 1.2 mg/dL   GFR, Estimated 19 (L) >60 mL/min    Comment: (NOTE) Calculated using the CKD-EPI Creatinine Equation (2021)    Anion gap 13 5 - 15    Comment: Performed at [facility], [address] [place], [name] [address]  CBC     Status: Abnormal   Collection Time: 10/23/20  2:20 AM  Result Value Ref Range   WBC 12.8 (H) 4.0 - 10.5 K/uL   RBC 4.24 4.22 - 5.81 MIL/uL   Hemoglobin 11.6 (L) 13.0 - 17.0 g/dL   HCT 28.4 (L) 13.2 - 44.0 %   MCV 84.7 80.0 - 100.0 fL   MCH 27.4 26.0 - 34.0 pg   MCHC 32.3 30.0 - 36.0 g/dL   RDW 10.2 72.5 - 36.6 %   Platelets 262 150 - 400 K/uL   nRBC 0.0 0.0 - 0.2 %    Comment: Performed at [facility], [address], [place], [name] [address]  Creatinine, serum     Status: Abnormal   Collection Time: 10/23/20  2:20 AM  Result Value Ref Range   Creatinine, Ser 2.66 (H) 0.61 - 1.24 mg/dL   GFR, Estimated 27 (L) >60 mL/min    Comment: (NOTE) Calculated using the CKD-EPI Creatinine Equation (2021) Performed at [facility], [address], [place], [name] [address]     Current Facility-Administered Medications  Medication Dose Route Frequency Provider Last Rate Last Admin  . acetaminophen (TYLENOL) tablet 500 mg  500 mg Oral Q6H PRN Dow Adolph  N, DO      . enoxaparin (LOVENOX) injection 30 mg  30 mg Subcutaneous Q24H Hall, Carole N, DO   30 mg at 10/23/20 [id number]  . lactated ringers  infusion   Intravenous Continuous Darlin DropHall, Carole N, DO   Paused at 10/23/20 1010  . melatonin tablet 3 mg  3 mg Oral QHS PRN Dow AdolphHall, Carole N, DO      . ondansetron (ZOFRAN) injection 4 mg  4 mg Intravenous Q6H PRN Hall, Carole N, DO      . QUEtiapine (SEROQUEL) tablet 200 mg  200 mg Oral BID Bobbye MortonMcQuilla, Tyger Wichman B, MD       Current Outpatient Medications  Medication Sig Dispense Refill  . albuterol (PROVENTIL HFA;VENTOLIN HFA) 108 (90 BASE) MCG/ACT inhaler Inhale 2 puffs into the lungs every 4 (four) hours as needed for wheezing or shortness of breath. 1 Inhaler 0  . amantadine (SYMMETREL) 100 MG capsule Take 1 capsule (100 mg total) by mouth 2 (two) times daily. 60 capsule 0  . dapagliflozin propanediol (FARXIGA) 10 MG TABS tablet Take 10 mg by mouth daily after breakfast.    . DEPAKOTE 250 MG DR tablet Take 250 mg by mouth See admin instructions. Take one at 12PM and one at bedtime.    . divalproex (DEPAKOTE) 125 MG DR tablet Take 1 tablet (125 mg total) by mouth daily with breakfast.    . hydrOXYzine (ATARAX/VISTARIL) 25 MG tablet Take 25 mg by mouth 4 (four) times daily.    . insulin glargine (LANTUS) 100 UNIT/ML injection Inject 0.2 mLs (20 Units total) into the skin at bedtime. (Patient taking differently: Inject 25 Units into the skin at bedtime.) 10 mL 0  . lisinopril (ZESTRIL) 20 MG tablet TAKE 1/2 TABLET (10 MG TOTAL) BY MOUTH DAILY. (Patient taking differently: Take 20 mg by mouth daily.) 30 tablet 1  . magnesium oxide (MAG-OX) 400 (240 Mg) MG tablet Take 1 tablet (400 mg total) by mouth daily. 30 tablet 0  . metFORMIN (GLUCOPHAGE) 1000 MG tablet Take 1,000 mg by mouth 2 (two) times daily with a meal.    . QUEtiapine (SEROQUEL) 200 MG tablet Take 1 tablet (200 mg total) by mouth once for 1 dose. 1 tablet 0  . traZODone (DESYREL) 50 MG tablet Take 50-100 mg by mouth at bedtime.    Marland Kitchen. zolpidem (AMBIEN) 5 MG tablet Take 5 mg by mouth at bedtime.      Musculoskeletal: Strength & Muscle Tone:  within normal limits Gait & Station: remains sitting on edge of bed Patient leans: N/A            Psychiatric Specialty Exam:  Presentation  General Appearance: Appropriate for Environment  Eye Contact:Good  Speech:Pressured  Speech Volume:Increased  Handedness:Right   Mood and Affect  Mood:Irritable  Affect:Labile   Thought Process  Thought Processes:Irrevelant  Descriptions of Associations:Loose  Orientation:Partial (knows who and where he is does not answer the year)  Thought Content:Delusions (talking about how he is the principal of a school)  History of Schizophrenia/Schizoaffective disorder:Yes  Duration of Psychotic Symptoms:Greater than six months  Hallucinations:Hallucinations: None  Ideas of Reference:Delusions  Suicidal Thoughts:Suicidal Thoughts: No  Homicidal Thoughts:Homicidal Thoughts: No   Sensorium  Memory:Immediate Poor; Recent Poor; Remote Poor  Judgment:Impaired  Insight:None   Executive Functions  Concentration:Poor  Attention Span:Poor  Recall:-- (patient poor attention and delusion kept him preoccupied and he refused to answer some questions because he preferred talking about what he wanted to)  Progress EnergyFund of Knowledge:Poor  Language:Fair   Psychomotor Activity  Psychomotor Activity:Psychomotor Activity: Normal   Assets  Assets:Housing   Sleep  Sleep:Sleep: Fair   Physical Exam: Physical Exam Constitutional:      Appearance: Normal appearance.  HENT:     Head: Normocephalic and atraumatic.  Eyes:     Extraocular Movements: Extraocular movements intact.     Conjunctiva/sclera: Conjunctivae normal.  Cardiovascular:     Rate and Rhythm: Tachycardia present.  Pulmonary:     Effort: Pulmonary effort is normal.     Breath sounds: Normal breath sounds.  Abdominal:     General: Abdomen is flat.  Musculoskeletal:        General: Normal range of motion.  Skin:    General: Skin is warm and dry.   Neurological:     Mental Status: He is alert. Mental status is at baseline.    Review of Systems  Constitutional: Negative for chills and fever.  HENT: Negative for hearing loss.   Eyes: Negative for blurred vision.  Respiratory: Negative for cough and wheezing.   Cardiovascular: Negative for chest pain.  Gastrointestinal: Negative for abdominal pain.  Neurological: Negative for dizziness.  Psychiatric/Behavioral: Negative for suicidal ideas.   Blood pressure 111/79, pulse 97, temperature 97.9 F (36.6 C), temperature source Oral, resp. rate 18, SpO2 100 %. There is no height or weight on file to calculate BMI.  Treatment Plan Summary: Daily contact with patient to assess and evaluate symptoms and progress in treatment Schizophrenia Patient was seen by this provider 2 times earlier this week. Patient does appear more delusional today, but has been without his medication for 2 days. Patient will likely return to baseline with his medication and treatment of his AKI. Patient appears to say odd things but has no intent behind want he is saying and often does not recall his statements hours later when asked.   Recommendations  - Seroquel 200mg  BID  - Psych will reassess in the AM  On reassessment after pulling out his IV patient's delusions are getting stronger and patient is no longer willing to eat or drink at the moment. Will order one time dose of Zyprexa zydis. Patient's Seroquel has been scheduled BID q 12 h unfortunately patient received a dose at 12 am but is not due until 8pm as this would be when he normally receives this medication. This one time dose of Zyprexa should keep patient stable until he can get back onto his routine medication schedule.   - Zyprexa Zydis 5mg  once  Disposition: No evidence of imminent risk to self or others at present.   Patient does not meet criteria for psychiatric inpatient admission.  PGY-1 , MD 10/23/2020 12:32 PM

## 2020-10-24 DIAGNOSIS — N179 Acute kidney failure, unspecified: Secondary | ICD-10-CM | POA: Diagnosis present

## 2020-10-24 DIAGNOSIS — R4689 Other symptoms and signs involving appearance and behavior: Secondary | ICD-10-CM | POA: Diagnosis not present

## 2020-10-24 DIAGNOSIS — E119 Type 2 diabetes mellitus without complications: Secondary | ICD-10-CM | POA: Diagnosis present

## 2020-10-24 DIAGNOSIS — Z794 Long term (current) use of insulin: Secondary | ICD-10-CM | POA: Diagnosis not present

## 2020-10-24 DIAGNOSIS — R5081 Fever presenting with conditions classified elsewhere: Secondary | ICD-10-CM | POA: Diagnosis not present

## 2020-10-24 DIAGNOSIS — R45850 Homicidal ideations: Secondary | ICD-10-CM | POA: Diagnosis present

## 2020-10-24 DIAGNOSIS — K219 Gastro-esophageal reflux disease without esophagitis: Secondary | ICD-10-CM | POA: Diagnosis present

## 2020-10-24 DIAGNOSIS — E785 Hyperlipidemia, unspecified: Secondary | ICD-10-CM | POA: Diagnosis present

## 2020-10-24 DIAGNOSIS — Z87442 Personal history of urinary calculi: Secondary | ICD-10-CM | POA: Diagnosis not present

## 2020-10-24 DIAGNOSIS — R569 Unspecified convulsions: Secondary | ICD-10-CM | POA: Diagnosis present

## 2020-10-24 DIAGNOSIS — Z20822 Contact with and (suspected) exposure to covid-19: Secondary | ICD-10-CM | POA: Diagnosis present

## 2020-10-24 DIAGNOSIS — G9341 Metabolic encephalopathy: Secondary | ICD-10-CM

## 2020-10-24 DIAGNOSIS — E860 Dehydration: Secondary | ICD-10-CM | POA: Diagnosis present

## 2020-10-24 DIAGNOSIS — I10 Essential (primary) hypertension: Secondary | ICD-10-CM

## 2020-10-24 DIAGNOSIS — F17210 Nicotine dependence, cigarettes, uncomplicated: Secondary | ICD-10-CM | POA: Diagnosis present

## 2020-10-24 DIAGNOSIS — Z888 Allergy status to other drugs, medicaments and biological substances status: Secondary | ICD-10-CM | POA: Diagnosis not present

## 2020-10-24 DIAGNOSIS — Z5320 Procedure and treatment not carried out because of patient's decision for unspecified reasons: Secondary | ICD-10-CM | POA: Diagnosis not present

## 2020-10-24 DIAGNOSIS — Z880 Allergy status to penicillin: Secondary | ICD-10-CM | POA: Diagnosis not present

## 2020-10-24 DIAGNOSIS — Z79899 Other long term (current) drug therapy: Secondary | ICD-10-CM | POA: Diagnosis not present

## 2020-10-24 DIAGNOSIS — F200 Paranoid schizophrenia: Secondary | ICD-10-CM | POA: Diagnosis present

## 2020-10-24 DIAGNOSIS — F2089 Other schizophrenia: Secondary | ICD-10-CM | POA: Diagnosis not present

## 2020-10-24 DIAGNOSIS — E872 Acidosis: Secondary | ICD-10-CM | POA: Diagnosis present

## 2020-10-24 LAB — BASIC METABOLIC PANEL
Anion gap: 9 (ref 5–15)
BUN: 46 mg/dL — ABNORMAL HIGH (ref 6–20)
CO2: 24 mmol/L (ref 22–32)
Calcium: 9.2 mg/dL (ref 8.9–10.3)
Chloride: 107 mmol/L (ref 98–111)
Creatinine, Ser: 1.75 mg/dL — ABNORMAL HIGH (ref 0.61–1.24)
GFR, Estimated: 44 mL/min — ABNORMAL LOW (ref >60)
Glucose, Bld: 102 mg/dL — ABNORMAL HIGH (ref 70–99)
Potassium: 4.3 mmol/L (ref 3.5–5.1)
Sodium: 140 mmol/L (ref 135–145)

## 2020-10-24 LAB — GLUCOSE, CAPILLARY
Glucose-Capillary: 165 mg/dL — ABNORMAL HIGH (ref 70–99)
Glucose-Capillary: 134 mg/dL — ABNORMAL HIGH (ref 70–99)
Glucose-Capillary: 125 mg/dL — ABNORMAL HIGH (ref 70–99)

## 2020-10-24 MED ORDER — NICOTINE 14 MG/24HR TD PT24
14.0000 mg | MEDICATED_PATCH | Freq: Every day | TRANSDERMAL | Status: DC
  Administered 2020-10-24 – 2020-10-28 (×5): 14 mg via TRANSDERMAL
  Filled 2020-10-24 (×6): qty 1

## 2020-10-24 MED ORDER — QUETIAPINE FUMARATE 100 MG PO TABS
200.0000 mg | ORAL_TABLET | Freq: Two times a day (BID) | ORAL | Status: DC
  Administered 2020-10-24 – 2020-10-26 (×3): 200 mg via ORAL
  Filled 2020-10-24 (×5): qty 2

## 2020-10-24 MED ORDER — LACTATED RINGERS IV SOLN: INTRAVENOUS | Status: DC

## 2020-10-24 MED ORDER — INSULIN ASPART 100 UNIT/ML IJ SOLN
0.0000 [IU] | Freq: Three times a day (TID) | INTRAMUSCULAR | Status: DC
  Administered 2020-10-24: 1 [IU] via SUBCUTANEOUS
  Administered 2020-10-24: 2 [IU] via SUBCUTANEOUS
  Administered 2020-10-25 (×2): 1 [IU] via SUBCUTANEOUS
  Administered 2020-10-26: 2 [IU] via SUBCUTANEOUS
  Administered 2020-10-27: 1 [IU] via SUBCUTANEOUS
  Administered 2020-10-27 – 2020-10-28 (×2): 2 [IU] via SUBCUTANEOUS
  Administered 2020-10-28: 1 [IU] via SUBCUTANEOUS

## 2020-10-24 MED ORDER — INSULIN ASPART 100 UNIT/ML IJ SOLN
0.0000 [IU] | Freq: Every day | INTRAMUSCULAR | Status: DC
Start: 2020-10-24 — End: 2020-10-28

## 2020-10-24 MED ORDER — HALOPERIDOL LACTATE 5 MG/ML IJ SOLN
5.0000 mg | Freq: Four times a day (QID) | INTRAMUSCULAR | Status: DC | PRN
Start: 2020-10-24 — End: 2020-10-28
  Administered 2020-10-26: 5 mg via INTRAMUSCULAR
  Filled 2020-10-24: qty 1

## 2020-10-24 NOTE — Progress Notes (Signed)
VAS Team consult ordered for IV placement. At this time, 3 IV Team nurses have been in patient's room with no cooperation. Patient insists that we only can only stick one place and will not allow [place] assessment for proper placement. Until patient more cooperative, IV Team consult will be discontinued. RN notified.

## 2020-10-24 NOTE — Plan of Care (Signed)
  Problem: Safety: Goal: Ability to remain free from injury will improve Outcome: Progressing   

## 2020-10-24 NOTE — TOC Progression Note (Signed)
Transition of Care [facility]) - Progression Note    Patient Details  Name: Ronald Roman MRN: [id number] Date of Birth: [birth year]  [facility] CM/SW Contact  Ralene Bathe, LCSWA Phone Number: 10/24/2020, 2:59 PM  Clinical Narrative:    CSW spoke with Dorthula Perfect with [facility].  CSW was informed that the patient could come back to the facility when he is stable and "back to himself".  Ms. Wallis Bamberg informed CSW that she thinks that the patient needs a mental health evaluation prior to returning.  CSW informed Ms. Wallis Bamberg that a psychiatrist at the hospital is following the patient.          Expected Discharge Plan and Services                                                 Social Determinants of Health (SDOH) Interventions    Readmission Risk Interventions No flowsheet data found.

## 2020-10-24 NOTE — Consult Note (Signed)
[facility] Face-to-Face Psychiatry Consult   Reason for Consult:  Uncontrolled Schizophrenia  Referring Physician:  Dow Adolph, DO Patient Identification: Ronald Roman MRN:  [id number] Principal Diagnosis: <principal problem not specified> Diagnosis:  Active Problems:   AKI (acute kidney injury) (HCC)   Total Time spent with patient: 20 minutes  Subjective:   Ronald Roman is a 60 y.o. male patient admitted with AKI after being reported missing for 2 days. Patient presented via [facility] after they found him. Patient has a PPH of schizophrenia.Marland Kitchen  HPI:   Yesterday afternoon patient appeared to respond well to his Zyprexa, unfortunately patient did not receive his second dose of Seroquel. On assessment this AM patient appears improved. Patient is up and eating on the edge of his bed. Patient reports that he "thinks" he slept well last night until he the care team tried to draw blood. Per EMR patient did not allow IV placement and did not allow blood draws. Patient denies SI, HI, and AVH. Provider saw patient take his medications with no issues. Patient had the insight to ask RN about his "patch" and explained that he got a patch at his group home for smoking and reported he did not bring any patches or cigarettes with him. Provider asked patient how many cigarettes he normally smoked and patient reported 5. Patient reported that he was looking forward to watching Alvino Chapel, but then began saying things that did not make much sense about Alvino Chapel. Patient continues to have some delusions about his care team but follows instructions today. Patient sitter was in room and noted that she has had no issues with the patient.   Patient denies that he still lives at the same group home today as listed in his chart. Patient recalls the staff at his group home but has what appears to be a delusion that he has changed group homes.  Past Psychiatric History: Schizophrenia  Risk to Self:   NO  Risk to Others:    NO Prior Inpatient Therapy:   [facility] 2017, [facility] 5/30 and 10/21/2020 Prior Outpatient Therapy:  Yes in group home setting, recommended patient initiate new OP therapy at [facility]  Past Medical History:  Past Medical History:  Diagnosis Date  . Diabetes mellitus without complication (HCC)   . GERD (gastroesophageal reflux disease)   . Hyperlipidemia   . Hypertension   . Paranoid schizophrenia (HCC)   . Sleep apnea   . Uric acid nephrolithiasis    No past surgical history on file. Family History: No family history on file. Family Psychiatric  History: Unknown Social History:  Social History   Substance and Sexual Activity  Alcohol Use No     Social History   Substance and Sexual Activity  Drug Use No    Social History   Socioeconomic History  . Marital status: Single    Spouse name: Not on file  . Number of children: Not on file  . Years of education: Not on file  . Highest education level: Not on file  Occupational History  . Not on file  Tobacco Use  . Smoking status: Current Every Day Smoker    Packs/day: 1.00    Types: Cigarettes  . Smokeless tobacco: Never Used  Substance and Sexual Activity  . Alcohol use: No  . Drug use: No  . Sexual activity: Not Currently  Other Topics Concern  . Not on file  Social History Narrative  . Not on file   Social Determinants of Health  Financial Resource Strain: Not on file  Food Insecurity: Not on file  Transportation Needs: Not on file  Physical Activity: Not on file  Stress: Not on file  Social Connections: Not on file   Additional Social History:    Allergies:   Allergies  Allergen Reactions  . Cogentin [Benztropine] Other (See Comments)    Per MAR   . Penicillins Other (See Comments)    Per MAR - unable to verify patients PCN reaction   . Prolixin [Fluphenazine] Other (See Comments)    Per MAR     Labs:  Results for orders placed or performed during the hospital encounter of 10/22/20 (from the past 48  hour(s))  Ethanol     Status: None   Collection Time: 10/22/20  9:14 PM  Result Value Ref Range   Alcohol, Ethyl (B) <10 <10 mg/dL    Comment: (NOTE) Lowest detectable limit for serum alcohol is 10 mg/dL.  For medical purposes only. Performed at [facility], [address], [place], [name] [address]   CBC     Status: Abnormal   Collection Time: 10/22/20  9:14 PM  Result Value Ref Range   WBC 7.6 4.0 - 10.5 K/uL   RBC 4.59 4.22 - 5.81 MIL/uL   Hemoglobin 12.6 (L) 13.0 - 17.0 g/dL   HCT 21.3 08.6 - 57.8 %   MCV 85.4 80.0 - 100.0 fL   MCH 27.5 26.0 - 34.0 pg   MCHC 32.1 30.0 - 36.0 g/dL   RDW 46.9 62.9 - 52.8 %   Platelets 151 150 - 400 K/uL   nRBC 0.0 0.0 - 0.2 %    Comment: Performed at [facility], [address] [place], [name] [address]  Resp Panel by RT-PCR (Flu A&B, Covid) Nasopharyngeal Swab     Status: None   Collection Time: 10/22/20  9:14 PM   Specimen: Nasopharyngeal Swab; Nasopharyngeal(NP) swabs in vial transport medium  Result Value Ref Range   SARS Coronavirus 2 by RT PCR NEGATIVE NEGATIVE    Comment: (NOTE) SARS-CoV-2 target nucleic acids are NOT DETECTED.  The SARS-CoV-2 RNA is generally detectable in upper respiratory specimens during the acute phase of infection. The lowest concentration of SARS-CoV-2 viral copies this assay can detect is 138 copies/mL. A negative result does not preclude SARS-Cov-2 infection and should not be used as the sole basis for treatment or other patient management decisions. A negative result may occur with  improper specimen collection/handling, submission of specimen other than nasopharyngeal swab, presence of viral mutation(s) within the areas targeted by this assay, and inadequate number of viral copies(<138 copies/mL). A negative result must be combined with clinical observations, patient history, and epidemiological information. The expected result is Negative.  Fact Sheet for Patients:   BloggerCourse.com  Fact Sheet for Healthcare Providers:  SeriousBroker.it  This test is no t yet approved or cleared by the [place] FDA and  has been authorized for detection and/or diagnosis of SARS-CoV-2 by FDA under an Emergency Use Authorization (EUA). This EUA will remain  in effect (meaning this test can be used) for the duration of the COVID-19 declaration under Section 564(b)(1) of the Act, 21 U.S.C.section 360bbb-3(b)(1), unless the authorization is terminated  or revoked sooner.       Influenza A by PCR NEGATIVE NEGATIVE   Influenza B by PCR NEGATIVE NEGATIVE    Comment: (NOTE) The Xpert Xpress SARS-CoV-2/FLU/RSV plus assay is intended as an aid in the diagnosis of influenza from  Nasopharyngeal swab specimens and should not be used as a sole basis for treatment. Nasal washings and aspirates are unacceptable for Xpert Xpress SARS-CoV-2/FLU/RSV testing.  Fact Sheet for Patients: BloggerCourse.com  Fact Sheet for Healthcare Providers: SeriousBroker.it  This test is not yet approved or cleared by the [place] FDA and has been authorized for detection and/or diagnosis of SARS-CoV-2 by FDA under an Emergency Use Authorization (EUA). This EUA will remain in effect (meaning this test can be used) for the duration of the COVID-19 declaration under Section 564(b)(1) of the Act, 21 U.S.C. section 360bbb-3(b)(1), unless the authorization is terminated or revoked.  Performed at [facility], [address], [place], [name] [address]   Comprehensive metabolic panel     Status: Abnormal   Collection Time: 10/22/20 10:32 PM  Result Value Ref Range   Sodium 144 135 - 145 mmol/L   Potassium 4.4 3.5 - 5.1 mmol/L   Chloride 113 (H) 98 - 111 mmol/L   CO2 18 (L) 22 - 32 mmol/L   Glucose, Bld 99 70 - 99 mg/dL    Comment: Glucose reference range applies only to  samples taken after fasting for at least 8 hours.   BUN 66 (H) 6 - 20 mg/dL   Creatinine, Ser 7.91 (H) 0.61 - 1.24 mg/dL   Calcium 8.9 8.9 - 50.5 mg/dL   Total Protein 6.2 (L) 6.5 - 8.1 g/dL   Albumin 3.5 3.5 - 5.0 g/dL   AST 46 (H) 15 - 41 U/L   ALT 19 0 - 44 U/L   Alkaline Phosphatase 65 38 - 126 U/L   Total Bilirubin 1.0 0.3 - 1.2 mg/dL   GFR, Estimated 19 (L) >60 mL/min    Comment: (NOTE) Calculated using the CKD-EPI Creatinine Equation (2021)    Anion gap 13 5 - 15    Comment: Performed at [facility], [address] [place], [name] [address]  CBC     Status: Abnormal   Collection Time: 10/23/20  2:20 AM  Result Value Ref Range   WBC 12.8 (H) 4.0 - 10.5 K/uL   RBC 4.24 4.22 - 5.81 MIL/uL   Hemoglobin 11.6 (L) 13.0 - 17.0 g/dL   HCT 80.1 (L) 65.5 - 37.4 %   MCV 84.7 80.0 - 100.0 fL   MCH 27.4 26.0 - 34.0 pg   MCHC 32.3 30.0 - 36.0 g/dL   RDW 82.7 07.8 - 67.5 %   Platelets 262 150 - 400 K/uL   nRBC 0.0 0.0 - 0.2 %    Comment: Performed at [facility], [address] [place], [name] [address]  Creatinine, serum     Status: Abnormal   Collection Time: 10/23/20  2:20 AM  Result Value Ref Range   Creatinine, Ser 2.66 (H) 0.61 - 1.24 mg/dL   GFR, Estimated 27 (L) >60 mL/min    Comment: (NOTE) Calculated using the CKD-EPI Creatinine Equation (2021) Performed at [facility], [address], [place], [name] [address]   Magnesium     Status: None   Collection Time: 10/23/20  4:06 AM  Result Value Ref Range   Magnesium 1.9 1.7 - 2.4 mg/dL    Comment: Performed at [facility], [address], [place], [name] [address]  Phosphorus     Status: Abnormal   Collection Time: 10/23/20  4:06 AM  Result Value Ref Range   Phosphorus 4.9 (H) 2.5 - 4.6 mg/dL    Comment: Performed  at [facility], [address] [place], [name] [address]  Basic metabolic panel     Status: Abnormal   Collection Time: 10/24/20  7:21 AM  Result Value Ref Range    Sodium 140 135 - 145 mmol/L   Potassium 4.3 3.5 - 5.1 mmol/L   Chloride 107 98 - 111 mmol/L   CO2 24 22 - 32 mmol/L   Glucose, Bld 102 (H) 70 - 99 mg/dL    Comment: Glucose reference range applies only to samples taken after fasting for at least 8 hours.   BUN 46 (H) 6 - 20 mg/dL   Creatinine, Ser 6.04 (H) 0.61 - 1.24 mg/dL   Calcium 9.2 8.9 - 54.0 mg/dL   GFR, Estimated 44 (L) >60 mL/min    Comment: (NOTE) Calculated using the CKD-EPI Creatinine Equation (2021)    Anion gap 9 5 - 15    Comment: Performed at [facility], [address] [place], [name] [address]    Current Facility-Administered Medications  Medication Dose Route Frequency Provider Last Rate Last Admin  . acetaminophen (TYLENOL) tablet 500 mg  500 mg Oral Q6H PRN Dow Adolph N, DO      . enoxaparin (LOVENOX) injection 30 mg  30 mg Subcutaneous Q24H Hall, Carole N, DO   30 mg at 10/23/20 1478  . lactated ringers infusion   Intravenous Continuous [name], DO   Paused at 10/23/20 1010  . lactated ringers infusion   Intravenous Continuous Rai, Ripudeep K, MD      . melatonin tablet 3 mg  3 mg Oral QHS PRN Dow Adolph N, DO      . nicotine (NICODERM CQ - dosed in mg/24 hours) patch 14 mg  14 mg Transdermal Daily Rai, Ripudeep K, MD      . ondansetron (ZOFRAN) injection 4 mg  4 mg Intravenous Q6H PRN Hall, Carole N, DO      . QUEtiapine (SEROQUEL) tablet 200 mg  200 mg Oral BID Eliseo Gum B, MD   200 mg at 10/24/20 2956    Musculoskeletal: Strength & Muscle Tone: within normal limits Gait & Station: remains sitting on bed Patient leans: N/A            Psychiatric Specialty Exam:  Presentation  General Appearance: Appropriate for Environment  Eye Contact:Minimal  Speech:Clear and Coherent  Speech Volume:Normal  Handedness:Right   Mood and Affect  Mood:Euthymic  Affect:Appropriate   Thought Process  Thought Processes:Linear  Descriptions of  Associations:Circumstantial  Orientation:-- (oriented to person and place not time or situation)  Thought Content:Delusions  History of Schizophrenia/Schizoaffective disorder:Yes  Duration of Psychotic Symptoms:Greater than six months  Hallucinations:Hallucinations: None  Ideas of Reference:None  Suicidal Thoughts:Suicidal Thoughts: No  Homicidal Thoughts:Homicidal Thoughts: No   Sensorium  Memory:Immediate Poor; Recent Poor; Remote Poor  Judgment:Fair  Insight:None   Executive Functions  Concentration:Poor  Attention Span:Fair  Recall:Poor  Fund of Knowledge:Poor  Language:Fair   Psychomotor Activity  Psychomotor Activity:Psychomotor Activity: Normal   Assets  Assets:Housing; Resilience   Sleep  Sleep:Sleep: Fair   Physical Exam: Physical Exam Constitutional:      Appearance: Normal appearance.  HENT:     Head: Normocephalic and atraumatic.  Eyes:     Extraocular Movements: Extraocular movements intact.     Conjunctiva/sclera: Conjunctivae normal.  Cardiovascular:     Rate and Rhythm: Normal rate.  Pulmonary:     Effort: Pulmonary effort is normal.     Breath sounds: Normal  breath sounds.  Abdominal:     General: Abdomen is flat.  Musculoskeletal:        General: Normal range of motion.  Skin:    General: Skin is warm and dry.  Neurological:     Mental Status: He is alert. Mental status is at baseline.    Review of Systems  Constitutional: Negative for chills and fever.  HENT: Negative for hearing loss.   Eyes: Negative for blurred vision.  Respiratory: Negative for cough and wheezing.   Cardiovascular: Negative for chest pain.  Gastrointestinal: Negative for abdominal pain.  Neurological: Negative for dizziness.  Psychiatric/Behavioral: Negative for suicidal ideas.   Blood pressure 111/79, pulse 97, temperature 97.9 F (36.6 C), temperature source Oral, resp. rate 18, SpO2 100 %. There is no height or weight on file to calculate  BMI.  Treatment Plan Summary: Daily contact with patient to assess and evaluate symptoms and progress in treatment Schizophrenia Patient appears closer to his baseline today. Patient continues to have delusions but is less preoccupied with them today and is able to follow instructions and answer basic questions.   Recommendations  - Seroquel 200mg  BID - Communicated to Primary team patient's request for nicotine patch - Recommend reaching back out to patient's group home for dispo planning  Psych will reassess in the AM.  Disposition: No evidence of imminent risk to self or others at present.   Patient does not meet criteria for psychiatric inpatient admission.  PGY-1 Bobbye MortonJai B Vernona Peake, MD 10/24/2020 9:36 AM

## 2020-10-24 NOTE — Progress Notes (Signed)
[facility]                                                                              Patient Demographics  Ronald Roman, is a 60 y.o. male, DOB - 12-Jan-1961, KTG:[id number]  Admit date - 10/22/2020   Admitting Physician [name], DO  Outpatient Primary MD for the patient is System, Provider Not In  Outpatient specialists:   LOS - 0  days   Medical records reviewed and are as summarized below:    Chief Complaint  Patient presents with  . Medical Clearance       Brief summary   Patient is a 60 year old male with history of schizophrenia, hypertension, diabetes mellitus presented to ED after being picked up on the street by [facility] police due to abnormal behavior.  Patient was found to be missing from his group home for 2 days.  Per EDP, there were homicidal threats made and patient had erratic behaviors.  Patient was likely not taking his medications. In ED, patient was found to have abnormal labs, creatinine 3.5, BUN 66, serum bicarb 18, gap 13.  Patient was admitted for AKI with acute metabolic encephalopathy/psychosis  Assessment & Plan    Principal Problem:   AKI (acute kidney injury) (HCC) in the setting of dehydration, NAG metabolic acidosis -Creatinine was 1.1 on 10/14/2020 -Presented with creatinine of 3.5, BUN 66, CO2 18 -Patient was placed on IV fluid hydration, he took his IV out on 6/2, has been tolerating p.o. diet -Creatinine improving 1.75 today   Active Problems:   Diabetes mellitus without complication (HCC), type II, IDDM -Hemoglobin A1c 5.2 on 10/11/2020.  Noted to be on Lantus 25 units at bedtime prior to admission however unclear if he was taking it -Placed on carb modified diet, sliding scale insulin -Fasting CBG 102 this a.m.  Acute metabolic encephalopathy/psychosis/paranoid schizophrenia -Patient had not been taking his medications, currently currently is 2 days prior to admission -Psychiatry following, per  recommendations, he is improving and approaching his baseline.  Does not meet inpatient criteria at this time. -Continue Seroquel 200 mg twice daily    Hypertension -BP currently stable, unclear if patient was taking lisinopril prior to admission -We will continue to hold given AKI  Nicotine use -Placed on nicotine patch   Code Status: Full CODE STATUS DVT Prophylaxis:  enoxaparin (LOVENOX) injection 30 mg Start: 10/23/20 1000 SCDs Start: 10/23/20 0220   Level of Care: Level of care: Med-Surg Family Communication: Discussed all imaging results, lab results, explained to the patient    Disposition Plan:     Status is: Observation  The patient will require care spanning > 2 midnights and should be moved to inpatient because: Inpatient level of care appropriate due to severity of illness  Dispo:  Patient From: Group Home  Planned Disposition: Group Home  Medically stable for discharge: No creatinine improving, approaching close to his baseline. Will DC back to group home once cleared by psychiatry     Time Spent in minutes 25 minutes  Procedures:  None  Consultants:   Psychiatry  Antimicrobials:   Anti-infectives (From admission, onward)   None  Medications  Scheduled Meds: . enoxaparin (LOVENOX) injection  30 mg Subcutaneous Q24H  . nicotine  14 mg Transdermal Daily  . QUEtiapine  200 mg Oral BID   Continuous Infusions: . lactated ringers Stopped (10/23/20 1010)  . lactated ringers     PRN Meds:.acetaminophen, melatonin, ondansetron (ZOFRAN) IV      Subjective:   Ronald Roman was seen and examined today.  No acute complaints by the patient, fairly cooperative.  Denies any pain.  No acute issues overnight.  No ongoing nausea vomiting diarrhea, chest pain or shortness of breath.  Sitter at the bedside  Objective:   Vitals:   10/23/20 0100 10/23/20 0130 10/23/20 0215 10/23/20 0629  BP: 117/71 111/71 109/81 111/79  Pulse: (!) 102 96 90 97   Resp: 20 20 20 18   Temp:    97.9 F (36.6 C)  TempSrc:    Oral  SpO2: 98% 99% 94% 100%    Intake/Output Summary (Last 24 hours) at 10/24/2020 1051 Last data filed at 10/24/2020 0500 Gross per 24 hour  Intake 782.74 ml  Output --  Net 782.74 ml     Wt Readings from Last 3 Encounters:  10/09/20 88 kg  10/05/20 88 kg  05/20/20 87.5 kg     Exam  General: Alert and oriented, calm and cooperative  Cardiovascular: S1 S2 auscultated, no murmurs, RRR  Respiratory: Clear to auscultation bilaterally, no wheezing, rales or rhonchi  Gastrointestinal: Soft, nontender, nondistended, + bowel sounds  Ext: no pedal edema bilaterally  Neuro: moving all 4 extremities spontaneously  Skin: No rashes  Psych: currently calm and cooperative   Data Reviewed:  I have personally reviewed following labs and imaging studies  Micro Results Recent Results (from the past 240 hour(s))  Resp Panel by RT-PCR (Flu A&B, Covid) Nasopharyngeal Swab     Status: None   Collection Time: 10/22/20  9:14 PM   Specimen: Nasopharyngeal Swab; Nasopharyngeal(NP) swabs in vial transport medium  Result Value Ref Range Status   SARS Coronavirus 2 by RT PCR NEGATIVE NEGATIVE Final    Comment: (NOTE) SARS-CoV-2 target nucleic acids are NOT DETECTED.  The SARS-CoV-2 RNA is generally detectable in upper respiratory specimens during the acute phase of infection. The lowest concentration of SARS-CoV-2 viral copies this assay can detect is 138 copies/mL. A negative result does not preclude SARS-Cov-2 infection and should not be used as the sole basis for treatment or other patient management decisions. A negative result may occur with  improper specimen collection/handling, submission of specimen other than nasopharyngeal swab, presence of viral mutation(s) within the areas targeted by this assay, and inadequate number of viral copies(<138 copies/mL). A negative result must be combined with clinical observations,  patient history, and epidemiological information. The expected result is Negative.  Fact Sheet for Patients:  BloggerCourse.comhttps://www.fda.gov/media/152166/download  Fact Sheet for Healthcare Providers:  SeriousBroker.ithttps://www.fda.gov/media/152162/download  This test is no t yet approved or cleared by the [place] FDA and  has been authorized for detection and/or diagnosis of SARS-CoV-2 by FDA under an Emergency Use Authorization (EUA). This EUA will remain  in effect (meaning this test can be used) for the duration of the COVID-19 declaration under Section 564(b)(1) of the Act, 21 U.S.C.section 360bbb-3(b)(1), unless the authorization is terminated  or revoked sooner.       Influenza A by PCR NEGATIVE NEGATIVE Final   Influenza B by PCR NEGATIVE NEGATIVE Final    Comment: (NOTE) The Xpert Xpress SARS-CoV-2/FLU/RSV plus assay is intended as an aid in  the diagnosis of influenza from Nasopharyngeal swab specimens and should not be used as a sole basis for treatment. Nasal washings and aspirates are unacceptable for Xpert Xpress SARS-CoV-2/FLU/RSV testing.  Fact Sheet for Patients: BloggerCourse.com  Fact Sheet for Healthcare Providers: SeriousBroker.it  This test is not yet approved or cleared by the [place] FDA and has been authorized for detection and/or diagnosis of SARS-CoV-2 by FDA under an Emergency Use Authorization (EUA). This EUA will remain in effect (meaning this test can be used) for the duration of the COVID-19 declaration under Section 564(b)(1) of the Act, 21 U.S.C. section 360bbb-3(b)(1), unless the authorization is terminated or revoked.  Performed at [facility], [address], [place], [name] [address]     Radiology Reports CT HEAD WO CONTRAST  Result Date: 10/11/2020 CLINICAL DATA:  Mental status change. EXAM: CT HEAD WITHOUT CONTRAST TECHNIQUE: Contiguous axial images were obtained from the base of the  skull through the vertex without intravenous contrast. COMPARISON:  Oct 05, 2020 FINDINGS: Brain: No evidence of acute infarction, hemorrhage, hydrocephalus, extra-axial collection or mass lesion/mass effect. Vascular: Calcified atherosclerosis in the intracranial carotids. Skull: Normal. Negative for fracture or focal lesion. Sinuses/Orbits: No acute finding. Other: None. IMPRESSION: No acute abnormalities identified. Electronically Signed   By: Gerome Sam III M.D   On: 10/11/2020 14:16   CT Head Wo Contrast  Result Date: 10/05/2020 CLINICAL DATA:  Seizure EXAM: CT HEAD WITHOUT CONTRAST TECHNIQUE: Contiguous axial images were obtained from the base of the skull through the vertex without intravenous contrast. COMPARISON:  June 22, 2020 FINDINGS: Brain: There is slight diffuse atrophy, stable. There is no intracranial mass, hemorrhage, extra-axial fluid collection, or midline shift. Scattered foci of decreased attenuation in the centra semiovale bilaterally are stable. No acute infarct evident. Vascular: No hyperdense vessels. Foci of calcification noted in each carotid siphon. Skull: Bony calvarium appears intact. Sinuses/Orbits: Visualized paranasal sinuses are clear. Orbits appear symmetric bilaterally. Other: Visualized mastoid air cells are clear. Stable foci of apparent scarring in the scalp region appear stable. IMPRESSION: Atrophy with mild periventricular small vessel disease. No acute infarct. No mass or hemorrhage. There are foci of arterial vascular calcification. Electronically Signed   By: Bretta Bang III M.D.   On: 10/05/2020 15:55   US Renal  Result Date: 10/11/2020 CLINICAL DATA:  Renal failure EXAM: RENAL / URINARY TRACT ULTRASOUND COMPLETE COMPARISON:  None. FINDINGS: Right Kidney: Renal measurements: 10.0 x 5.1 x 4.8 cm = volume: 128 mL. Echogenicity within normal limits. No mass or hydronephrosis visualized. Left Kidney: Renal measurements: 10.2 x 6.2 x 4.2 cm = volume: 138  mL. 8 normal echotexture. No hydronephrosis. 3.3 cm midpole cyst. Bladder: Appears normal for degree of bladder distention. Other: None. IMPRESSION: No acute findings.  No hydronephrosis. Electronically Signed   By: Charlett Nose M.D.   On: 10/11/2020 16:01   DG Chest Port 1 View  Result Date: 10/11/2020 CLINICAL DATA:  Unresponsive. EXAM: PORTABLE CHEST 1 VIEW COMPARISON:  None. FINDINGS: The cardiomediastinal silhouette is unremarkable. Increased MEDIAL RIGHT LOWER lung opacity noted. No consolidation, pleural effusion, pneumothorax or acute bony abnormality noted. IMPRESSION: Increased MEDIAL RIGHT LOWER lung opacity which may represent atelectasis or airspace disease/pneumonia. Electronically Signed   By: Harmon Pier M.D.   On: 10/11/2020 14:27    Lab Data:  CBC: Recent Labs  Lab 10/22/20 2114 10/23/20 0220  WBC 7.6 12.8*  HGB 12.6* 11.6*  HCT 39.2 35.9*  MCV 85.4 84.7  PLT 151  262   Basic Metabolic Panel: Recent Labs  Lab 10/22/20 2232 10/23/20 0220 10/23/20 0406 10/24/20 0721  NA 144  --   --  140  K 4.4  --   --  4.3  CL 113*  --   --  107  CO2 18*  --   --  24  GLUCOSE 99  --   --  102*  BUN 66*  --   --  46*  CREATININE 3.55* 2.66*  --  1.75*  CALCIUM 8.9  --   --  9.2  MG  --   --  1.9  --   PHOS  --   --  4.9*  --    GFR: CrCl cannot be calculated (Unknown ideal weight.). Liver Function Tests: Recent Labs  Lab 10/22/20 2232  AST 46*  ALT 19  ALKPHOS 65  BILITOT 1.0  PROT 6.2*  ALBUMIN 3.5   No results for input(s): LIPASE, AMYLASE in the last 168 hours. No results for input(s): AMMONIA in the last 168 hours. Coagulation Profile: No results for input(s): INR, PROTIME in the last 168 hours. Cardiac Enzymes: No results for input(s): CKTOTAL, CKMB, CKMBINDEX, TROPONINI in the last 168 hours. BNP (last 3 results) No results for input(s): PROBNP in the last 8760 hours. HbA1C: No results for input(s): HGBA1C in the last 72 hours. CBG: No results for  input(s): GLUCAP in the last 168 hours. Lipid Profile: No results for input(s): CHOL, HDL, LDLCALC, TRIG, CHOLHDL, LDLDIRECT in the last 72 hours. Thyroid Function Tests: No results for input(s): TSH, T4TOTAL, FREET4, T3FREE, THYROIDAB in the last 72 hours. Anemia Panel: No results for input(s): VITAMINB12, FOLATE, FERRITIN, TIBC, IRON, RETICCTPCT in the last 72 hours. Urine analysis:    Component Value Date/Time   COLORURINE STRAW (A) 10/11/2020 1618   APPEARANCEUR CLEAR 10/11/2020 1618   LABSPEC 1.005 10/11/2020 1618   PHURINE 5.0 10/11/2020 1618   GLUCOSEU >=500 (A) 10/11/2020 1618   HGBUR NEGATIVE 10/11/2020 1618   BILIRUBINUR NEGATIVE 10/11/2020 1618   KETONESUR NEGATIVE 10/11/2020 1618   PROTEINUR NEGATIVE 10/11/2020 1618   UROBILINOGEN 0.2 02/04/2015 0059   NITRITE NEGATIVE 10/11/2020 1618   LEUKOCYTESUR NEGATIVE 10/11/2020 1618     Thi Sisemore M.D. [facility] 10/24/2020, 10:51 AM  Available via Epic secure chat 7am-7pm After 7 pm, please refer to night coverage provider listed on amion.

## 2020-10-24 NOTE — Progress Notes (Signed)
Pt refused am labs and has no iv access d/t him removing previous iv and refusing to let iv team start a new one.

## 2020-10-25 DIAGNOSIS — F200 Paranoid schizophrenia: Secondary | ICD-10-CM

## 2020-10-25 DIAGNOSIS — N179 Acute kidney failure, unspecified: Secondary | ICD-10-CM

## 2020-10-25 LAB — CBC
HCT: 38.7 % — ABNORMAL LOW (ref 39.0–52.0)
Hemoglobin: 12.7 g/dL — ABNORMAL LOW (ref 13.0–17.0)
MCH: 27.2 pg (ref 26.0–34.0)
MCHC: 32.8 g/dL (ref 30.0–36.0)
MCV: 82.9 fL (ref 80.0–100.0)
Platelets: 288 10*3/uL (ref 150–400)
RBC: 4.67 MIL/uL (ref 4.22–5.81)
RDW: 14.2 % (ref 11.5–15.5)
WBC: 11.2 10*3/uL — ABNORMAL HIGH (ref 4.0–10.5)
nRBC: 0 % (ref 0.0–0.2)

## 2020-10-25 LAB — COMPREHENSIVE METABOLIC PANEL
ALT: 31 U/L (ref 0–44)
AST: 40 U/L (ref 15–41)
Albumin: 3.6 g/dL (ref 3.5–5.0)
Alkaline Phosphatase: 74 U/L (ref 38–126)
Anion gap: 10 (ref 5–15)
BUN: 33 mg/dL — ABNORMAL HIGH (ref 6–20)
CO2: 22 mmol/L (ref 22–32)
Calcium: 9.3 mg/dL (ref 8.9–10.3)
Chloride: 106 mmol/L (ref 98–111)
Creatinine, Ser: 1.31 mg/dL — ABNORMAL HIGH (ref 0.61–1.24)
GFR, Estimated: >60 mL/min (ref >60)
Glucose, Bld: 131 mg/dL — ABNORMAL HIGH (ref 70–99)
Potassium: 4.4 mmol/L (ref 3.5–5.1)
Sodium: 138 mmol/L (ref 135–145)
Total Bilirubin: 0.5 mg/dL (ref 0.3–1.2)
Total Protein: 6.2 g/dL — ABNORMAL LOW (ref 6.5–8.1)

## 2020-10-25 LAB — GLUCOSE, CAPILLARY
Glucose-Capillary: 129 mg/dL — ABNORMAL HIGH (ref 70–99)
Glucose-Capillary: 164 mg/dL — ABNORMAL HIGH (ref 70–99)
Glucose-Capillary: 109 mg/dL — ABNORMAL HIGH (ref 70–99)
Glucose-Capillary: 151 mg/dL — ABNORMAL HIGH (ref 70–99)

## 2020-10-25 NOTE — Progress Notes (Addendum)
Patient during this shift is uncooperative, agitated, and refusing care. Patient has refused all medications including Seroquel, has refused glucose checks and insulin, refused vitals sign checks, and has refused to allow RN to assess the patient. At beginning of shift, patient was seen by RN carrying a metal silverware knife from his mealtray in his hand while wandering the halls. Covering NP was called and orders were placed to change silverware from metal to plastic and that a PRN med be available if patient were to become physically aggressive with staff or self, as no previous PRN was ordered. Please see orders and provider communication documentation for further details. No sitter was available for tonight.

## 2020-10-25 NOTE — Progress Notes (Signed)
Patient appears more relaxed and cooperative compared to previous night shift. However, still restless and wandering about the room. Patient allowed me to check his vitals and obtain his evening blood glucose. However, patient still refused his evening dose of Seroquel.

## 2020-10-25 NOTE — Consult Note (Signed)
[facility] Face-to-Face Psychiatry Consult   Reason for Consult:  Uncontrolled Schizophrenia  Referring Physician:  Dow Adolph, DO Patient Identification: Ronald Roman MRN:  [id number] Principal Diagnosis: AKI (acute kidney injury) (HCC) Diagnosis:  Principal Problem:   AKI (acute kidney injury) (HCC) Active Problems:   Diabetes mellitus without complication (HCC)   Hypertension   Paranoid schizophrenia (HCC)   Acute metabolic encephalopathy   Total Time spent with patient: 15 minutes  Subjective:   Ronald Roman is a 60 y.o. male patient admitted with AKI after being reported missing for 2 days. Patient presented via [employer] after they found him. Patient has a PPH of schizophrenia.Marland Kitchen  HPI:   Patient seen and chart reviewed. Patient refused night time seroquel yesterday but was compliant this morning. Per documentation he was agitated early this morning and was refusing vitals, medications, glucose checks and was found to be carrying a metal silverware knife from his meal tray. Per MAR, he did not receive any medications for agitation. Patient interviewed this AM. He states that his mood is "ok". He denies SI/HI/AVH. When asked about his refusal of medications yesterday he states that he does not take seroquel  and that his address has been changed. Per sitter bedside, he has not been aggressive this morning but has been grinding his teeth.  Attempted to contact collateral for clarification on patient baseline  but was unable to reach and VM was left  [facility]Other[facility]  [phone] -called at 12:30 PM without answer and LVM with call back number     Past Psychiatric History: Schizophrenia  Risk to Self:   NO  Risk to Others:   NO Prior Inpatient Therapy:   [facility] 2017, [facility] 5/30 and 10/21/2020 Prior Outpatient Therapy:  Yes in group home setting, recommended patient initiate new OP therapy at [facility]  Past Medical History:  Past Medical History:  Diagnosis Date  .  Diabetes mellitus without complication (HCC)   . GERD (gastroesophageal reflux disease)   . Hyperlipidemia   . Hypertension   . Paranoid schizophrenia (HCC)   . Sleep apnea   . Uric acid nephrolithiasis    No past surgical history on file. Family History: No family history on file. Family Psychiatric  History: Unknown Social History:  Social History   Substance and Sexual Activity  Alcohol Use No     Social History   Substance and Sexual Activity  Drug Use No    Social History   Socioeconomic History  . Marital status: Single    Spouse name: Not on file  . Number of children: Not on file  . Years of education: Not on file  . Highest education level: Not on file  Occupational History  . Not on file  Tobacco Use  . Smoking status: Current Every Day Smoker    Packs/day: 1.00    Types: Cigarettes  . Smokeless tobacco: Never Used  Substance and Sexual Activity  . Alcohol use: No  . Drug use: No  . Sexual activity: Not Currently  Other Topics Concern  . Not on file  Social History Narrative  . Not on file   Social Determinants of Health   Financial Resource Strain: Not on file  Food Insecurity: Not on file  Transportation Needs: Not on file  Physical Activity: Not on file  Stress: Not on file  Social Connections: Not on file   Additional Social History:    Allergies:   Allergies  Allergen Reactions  .  Cogentin [Benztropine] Other (See Comments)    Per MAR   . Penicillins Other (See Comments)    Per MAR - unable to verify patients PCN reaction   . Prolixin [Fluphenazine] Other (See Comments)    Per MAR     Labs:  Results for orders placed or performed during the hospital encounter of 10/22/20 (from the past 48 hour(s))  Basic metabolic panel     Status: Abnormal   Collection Time: 10/24/20  7:21 AM  Result Value Ref Range   Sodium 140 135 - 145 mmol/L   Potassium 4.3 3.5 - 5.1 mmol/L   Chloride 107 98 - 111 mmol/L   CO2 24 22 - 32 mmol/L    Glucose, Bld 102 (H) 70 - 99 mg/dL    Comment: Glucose reference range applies only to samples taken after fasting for at least 8 hours.   BUN 46 (H) 6 - 20 mg/dL   Creatinine, Ser 0.27 (H) 0.61 - 1.24 mg/dL   Calcium 9.2 8.9 - 25.3 mg/dL   GFR, Estimated 44 (L) >60 mL/min    Comment: (NOTE) Calculated using the CKD-EPI Creatinine Equation (2021)    Anion gap 9 5 - 15    Comment: Performed at [facility], [address], [place], [name] [address]  Glucose, capillary     Status: Abnormal   Collection Time: 10/24/20  2:23 PM  Result Value Ref Range   Glucose-Capillary 165 (H) 70 - 99 mg/dL    Comment: Glucose reference range applies only to samples taken after fasting for at least 8 hours.  Glucose, capillary     Status: Abnormal   Collection Time: 10/24/20  5:51 PM  Result Value Ref Range   Glucose-Capillary 134 (H) 70 - 99 mg/dL    Comment: Glucose reference range applies only to samples taken after fasting for at least 8 hours.  Glucose, capillary     Status: Abnormal   Collection Time: 10/24/20  9:11 PM  Result Value Ref Range   Glucose-Capillary 125 (H) 70 - 99 mg/dL    Comment: Glucose reference range applies only to samples taken after fasting for at least 8 hours.  Glucose, capillary     Status: Abnormal   Collection Time: 10/25/20  7:54 AM  Result Value Ref Range   Glucose-Capillary 129 (H) 70 - 99 mg/dL    Comment: Glucose reference range applies only to samples taken after fasting for at least 8 hours.  CBC     Status: Abnormal   Collection Time: 10/25/20  9:20 AM  Result Value Ref Range   WBC 11.2 (H) 4.0 - 10.5 K/uL   RBC 4.67 4.22 - 5.81 MIL/uL   Hemoglobin 12.7 (L) 13.0 - 17.0 g/dL   HCT 34.7 (L) 42.5 - 95.6 %   MCV 82.9 80.0 - 100.0 fL   MCH 27.2 26.0 - 34.0 pg   MCHC 32.8 30.0 - 36.0 g/dL   RDW 38.7 56.4 - 33.2 %   Platelets 288 150 - 400 K/uL   nRBC 0.0 0.0 - 0.2 %    Comment: Performed at [facility], [address], [place], [name]  [address]  Comprehensive metabolic panel     Status: Abnormal   Collection Time: 10/25/20  9:20 AM  Result Value Ref Range   Sodium 138 135 - 145 mmol/L   Potassium 4.4 3.5 - 5.1 mmol/L   Chloride 106 98 - 111 mmol/L   CO2 22 22 - 32 mmol/L  Glucose, Bld 131 (H) 70 - 99 mg/dL    Comment: Glucose reference range applies only to samples taken after fasting for at least 8 hours.   BUN 33 (H) 6 - 20 mg/dL   Creatinine, Ser 1.77 (H) 0.61 - 1.24 mg/dL   Calcium 9.3 8.9 - 93.9 mg/dL   Total Protein 6.2 (L) 6.5 - 8.1 g/dL   Albumin 3.6 3.5 - 5.0 g/dL   AST 40 15 - 41 U/L   ALT 31 0 - 44 U/L   Alkaline Phosphatase 74 38 - 126 U/L   Total Bilirubin 0.5 0.3 - 1.2 mg/dL   GFR, Estimated >03 >00 mL/min    Comment: (NOTE) Calculated using the CKD-EPI Creatinine Equation (2021)    Anion gap 10 5 - 15    Comment: Performed at [facility], [address], [place], [name] [address]  Glucose, capillary     Status: Abnormal   Collection Time: 10/25/20 11:13 AM  Result Value Ref Range   Glucose-Capillary 164 (H) 70 - 99 mg/dL    Comment: Glucose reference range applies only to samples taken after fasting for at least 8 hours.    Current Facility-Administered Medications  Medication Dose Route Frequency Provider Last Rate Last Admin  . acetaminophen (TYLENOL) tablet 500 mg  500 mg Oral Q6H PRN Dow Adolph N, DO      . enoxaparin (LOVENOX) injection 30 mg  30 mg Subcutaneous Q24H Hall, Carole N, DO   30 mg at 10/25/20 0931  . haloperidol lactate (HALDOL) injection 5 mg  5 mg Intramuscular Q6H PRN Marikay Alar, FNP      . insulin aspart (novoLOG) injection 0-5 Units  0-5 Units Subcutaneous QHS Rai, Ripudeep K, MD      . insulin aspart (novoLOG) injection 0-9 Units  0-9 Units Subcutaneous TID WC Rai, Ripudeep K, MD   1 Units at 10/25/20 1146  . lactated ringers infusion   Intravenous Continuous Rai, Ripudeep K, MD      . melatonin tablet 3 mg  3 mg Oral QHS PRN Dow Adolph N, DO      .  nicotine (NICODERM CQ - dosed in mg/24 hours) patch 14 mg  14 mg Transdermal Daily Rai, Ripudeep K, MD   14 mg at 10/25/20 0931  . ondansetron (ZOFRAN) injection 4 mg  4 mg Intravenous Q6H PRN Hall, Carole N, DO      . QUEtiapine (SEROQUEL) tablet 200 mg  200 mg Oral BID Eliseo Gum B, MD   200 mg at 10/25/20 0762    Musculoskeletal: Strength & Muscle Tone: within normal limits Gait & Station: remains sitting on bed Patient leans: N/A            Psychiatric Specialty Exam:  Presentation  General Appearance: Appropriate for Environment  Eye Contact:Minimal  Speech:Clear and Coherent  Speech Volume:Normal  Handedness:Right   Mood and Affect  Mood:Euthymic  Affect:Appropriate   Thought Process  Thought Processes:Linear  Descriptions of Associations:Circumstantial  Orientation:-- (oriented to person and place not time or situation)  Thought Content:Delusions  History of Schizophrenia/Schizoaffective disorder:Yes  Duration of Psychotic Symptoms:Greater than six months  Hallucinations:Hallucinations: None  Ideas of Reference:None  Suicidal Thoughts:Suicidal Thoughts: No  Homicidal Thoughts:Homicidal Thoughts: No   Sensorium  Memory:Immediate Poor; Recent Poor; Remote Poor  Judgment:Fair  Insight:None   Executive Functions  Concentration:Poor  Attention Span:Fair  Recall:Poor  Fund of Knowledge:Poor  Language:Fair   Psychomotor Activity  Psychomotor Activity:Psychomotor Activity: Normal   Assets  Assets:Housing; Resilience   Sleep  Sleep:Sleep: Fair   Physical Exam: Physical Exam Constitutional:      Appearance: Normal appearance.  HENT:     Head: Normocephalic and atraumatic.  Eyes:     Extraocular Movements: Extraocular movements intact.     Conjunctiva/sclera: Conjunctivae normal.  Pulmonary:     Effort: Pulmonary effort is normal.  Neurological:     Mental Status: He is alert.    Review of Systems   Constitutional: Negative for chills and fever.  HENT: Negative for hearing loss.   Eyes: Negative for blurred vision.  Respiratory: Negative for cough and wheezing.   Cardiovascular: Negative for chest pain.  Gastrointestinal: Negative for abdominal pain.  Neurological: Negative for dizziness.  Psychiatric/Behavioral: Negative for depression and suicidal ideas.   Blood pressure 106/82, pulse (!) 104, temperature 97.9 F (36.6 C), temperature source Oral, resp. rate 19, SpO2 100 %. There is no height or weight on file to calculate BMI.  Treatment Plan Summary: Daily contact with patient to assess and evaluate symptoms and progress in treatment Schizophrenia   Recommendations  - Seroquel 200mg  BID - Recommend reaching back out to patient's group home for dispo planning -consider PRN for agitation if he continues to present with agitation- zyprexa 10 mg q8 hours PRN for agitation-PO and IM if refuses  Psych will reassess in the AM.  Disposition: No evidence of imminent risk to self or others at present.    per primary   Estella HuskKatherine S Yamila Cragin, MD Attending psychiatrist 10/25/2020 12:25 PM

## 2020-10-25 NOTE — Progress Notes (Signed)
[facility]                                                                              Patient Demographics  Ronald Roman, is a 60 y.o. male, DOB - 07/25/1960, LKG:[id number]  Admit date - 10/22/2020   Admitting Physician [name], DO  Outpatient Primary MD for the patient is System, Provider Not In  Outpatient specialists:   LOS - 1  days   Medical records reviewed and are as summarized below:    Chief Complaint  Patient presents with  . Medical Clearance       Brief summary   Patient is a 60 year old male with history of schizophrenia, hypertension, diabetes mellitus presented to ED after being picked up on the street by [facility] police due to abnormal behavior.  Patient was found to be missing from his group home for 2 days.  Per EDP, there were homicidal threats made and patient had erratic behaviors.  Patient was likely not taking his medications. In ED, patient was found to have abnormal labs, creatinine 3.5, BUN 66, serum bicarb 18, gap 13.  Patient was admitted for AKI with acute metabolic encephalopathy/psychosis  Assessment & Plan    Principal Problem:   AKI (acute kidney injury) (HCC) in the setting of dehydration, NAG metabolic acidosis -Creatinine was 1.1 on 10/14/2020 -Presented with creatinine of 3.5, BUN 66, CO2 18 -Patient was placed on IV fluid hydration, he took his IV out on 6/2, has been tolerating p.o. diet -Creatinine has improved to 1.7 on 6/3   Active Problems:   Diabetes mellitus without complication (HCC), type II, IDDM -Hemoglobin A1c 5.2 on 10/11/2020.  Noted to be on Lantus 25 units at bedtime prior to admission however unclear if he was taking it -Continue carb modified diet, sliding scale insulin -CBG stable  Acute metabolic encephalopathy/psychosis/paranoid schizophrenia - Patient had not been taking his medications, currently currently is 2 days prior to admission - Psychiatry following, does not meet inpatient  criteria at this time. - Continue Seroquel 200 mg twice daily - today he is uncooperative, grinding his teeth      Hypertension -BP currently stable, unclear if patient was taking lisinopril prior to admission -hold lisinopril given AKI   Nicotine use -Placed on nicotine patch   Code Status: Full CODE STATUS DVT Prophylaxis:  enoxaparin (LOVENOX) injection 30 mg Start: 10/23/20 1000 SCDs Start: 10/23/20 0220   Level of Care: Level of care: Med-Surg Family Communication: no family member present   Disposition Plan:     Status is: Observation  The patient will require care spanning > 2 midnights and should be moved to inpatient because: Inpatient level of care appropriate due to severity of illness  Dispo:  Patient From: Group Home  Planned Disposition: Group Home  Medically stable for discharge: No  Will DC back to group home once cleared by psychiatry     Time Spent in minutes 25 minutes  Procedures:  None  Consultants:   Psychiatry  Antimicrobials:   Anti-infectives (From admission, onward)   None         Medications  Scheduled Meds: . enoxaparin (  LOVENOX) injection  30 mg Subcutaneous Q24H  . insulin aspart  0-5 Units Subcutaneous QHS  . insulin aspart  0-9 Units Subcutaneous TID WC  . nicotine  14 mg Transdermal Daily  . QUEtiapine  200 mg Oral BID   Continuous Infusions: . lactated ringers     PRN Meds:.acetaminophen, haloperidol lactate, melatonin, ondansetron (ZOFRAN) IV      Subjective:   Ronald Roman was seen and examined today. Uncooperative, sitter at the bed side. Grinding his teeth.     Objective:   Vitals:   10/24/20 1900 10/25/20 0058 10/25/20 0610 10/25/20 0756  BP:   (!) 110/40 106/82  Pulse:   (!) 109 (!) 104  Resp:   19 19  Temp:   99 F (37.2 C) 97.9 F (36.6 C)  TempSrc: Other (Comment) Other (Comment) Oral Oral  SpO2:   100% 100%    Intake/Output Summary (Last 24 hours) at 10/25/2020 1235 Last data filed at  10/25/2020 1202 Gross per 24 hour  Intake 920 ml  Output --  Net 920 ml     Wt Readings from Last 3 Encounters:  10/09/20 88 kg  10/05/20 88 kg  05/20/20 87.5 kg   Physical Exam  General: Alert and awake, uncooperative   Cardiovascular: S1 S2 clear, RRR. No pedal edema b/l  Respiratory: CTAB  Gastrointestinal: Soft, nontender, nondistended, NBS  Ext: no pedal edema bilaterally  Neuro: no new deficits  Psych: uncooperative      Data Reviewed:  I have personally reviewed following labs and imaging studies  Micro Results Recent Results (from the past 240 hour(s))  Resp Panel by RT-PCR (Flu A&B, Covid) Nasopharyngeal Swab     Status: None   Collection Time: 10/22/20  9:14 PM   Specimen: Nasopharyngeal Swab; Nasopharyngeal(NP) swabs in vial transport medium  Result Value Ref Range Status   SARS Coronavirus 2 by RT PCR NEGATIVE NEGATIVE Final    Comment: (NOTE) SARS-CoV-2 target nucleic acids are NOT DETECTED.  The SARS-CoV-2 RNA is generally detectable in upper respiratory specimens during the acute phase of infection. The lowest concentration of SARS-CoV-2 viral copies this assay can detect is 138 copies/mL. A negative result does not preclude SARS-Cov-2 infection and should not be used as the sole basis for treatment or other patient management decisions. A negative result may occur with  improper specimen collection/handling, submission of specimen other than nasopharyngeal swab, presence of viral mutation(s) within the areas targeted by this assay, and inadequate number of viral copies(<138 copies/mL). A negative result must be combined with clinical observations, patient history, and epidemiological information. The expected result is Negative.  Fact Sheet for Patients:  BloggerCourse.com  Fact Sheet for Healthcare Providers:  SeriousBroker.it  This test is no t yet approved or cleared by the [place]  FDA and  has been authorized for detection and/or diagnosis of SARS-CoV-2 by FDA under an Emergency Use Authorization (EUA). This EUA will remain  in effect (meaning this test can be used) for the duration of the COVID-19 declaration under Section 564(b)(1) of the Act, 21 U.S.C.section 360bbb-3(b)(1), unless the authorization is terminated  or revoked sooner.       Influenza A by PCR NEGATIVE NEGATIVE Final   Influenza B by PCR NEGATIVE NEGATIVE Final    Comment: (NOTE) The Xpert Xpress SARS-CoV-2/FLU/RSV plus assay is intended as an aid in the diagnosis of influenza from Nasopharyngeal swab specimens and should not be used as a sole basis for treatment. Nasal washings and aspirates are  unacceptable for Xpert Xpress SARS-CoV-2/FLU/RSV testing.  Fact Sheet for Patients: BloggerCourse.com  Fact Sheet for Healthcare Providers: SeriousBroker.it  This test is not yet approved or cleared by the [place] FDA and has been authorized for detection and/or diagnosis of SARS-CoV-2 by FDA under an Emergency Use Authorization (EUA). This EUA will remain in effect (meaning this test can be used) for the duration of the COVID-19 declaration under Section 564(b)(1) of the Act, 21 U.S.C. section 360bbb-3(b)(1), unless the authorization is terminated or revoked.  Performed at [facility], [address] [place], [name] [address]     Radiology Reports CT HEAD WO CONTRAST  Result Date: 10/11/2020 CLINICAL DATA:  Mental status change. EXAM: CT HEAD WITHOUT CONTRAST TECHNIQUE: Contiguous axial images were obtained from the base of the skull through the vertex without intravenous contrast. COMPARISON:  Oct 05, 2020 FINDINGS: Brain: No evidence of acute infarction, hemorrhage, hydrocephalus, extra-axial collection or mass lesion/mass effect. Vascular: Calcified atherosclerosis in the intracranial carotids. Skull: Normal. Negative for  fracture or focal lesion. Sinuses/Orbits: No acute finding. Other: None. IMPRESSION: No acute abnormalities identified. Electronically Signed   By: Gerome Sam III M.D   On: 10/11/2020 14:16   CT Head Wo Contrast  Result Date: 10/05/2020 CLINICAL DATA:  Seizure EXAM: CT HEAD WITHOUT CONTRAST TECHNIQUE: Contiguous axial images were obtained from the base of the skull through the vertex without intravenous contrast. COMPARISON:  June 22, 2020 FINDINGS: Brain: There is slight diffuse atrophy, stable. There is no intracranial mass, hemorrhage, extra-axial fluid collection, or midline shift. Scattered foci of decreased attenuation in the centra semiovale bilaterally are stable. No acute infarct evident. Vascular: No hyperdense vessels. Foci of calcification noted in each carotid siphon. Skull: Bony calvarium appears intact. Sinuses/Orbits: Visualized paranasal sinuses are clear. Orbits appear symmetric bilaterally. Other: Visualized mastoid air cells are clear. Stable foci of apparent scarring in the scalp region appear stable. IMPRESSION: Atrophy with mild periventricular small vessel disease. No acute infarct. No mass or hemorrhage. There are foci of arterial vascular calcification. Electronically Signed   By: Bretta Bang III M.D.   On: 10/05/2020 15:55   US Renal  Result Date: 10/11/2020 CLINICAL DATA:  Renal failure EXAM: RENAL / URINARY TRACT ULTRASOUND COMPLETE COMPARISON:  None. FINDINGS: Right Kidney: Renal measurements: 10.0 x 5.1 x 4.8 cm = volume: 128 mL. Echogenicity within normal limits. No mass or hydronephrosis visualized. Left Kidney: Renal measurements: 10.2 x 6.2 x 4.2 cm = volume: 138 mL. 8 normal echotexture. No hydronephrosis. 3.3 cm midpole cyst. Bladder: Appears normal for degree of bladder distention. Other: None. IMPRESSION: No acute findings.  No hydronephrosis. Electronically Signed   By: Charlett Nose M.D.   On: 10/11/2020 16:01   DG Chest Port 1 View  Result Date:  10/11/2020 CLINICAL DATA:  Unresponsive. EXAM: PORTABLE CHEST 1 VIEW COMPARISON:  None. FINDINGS: The cardiomediastinal silhouette is unremarkable. Increased MEDIAL RIGHT LOWER lung opacity noted. No consolidation, pleural effusion, pneumothorax or acute bony abnormality noted. IMPRESSION: Increased MEDIAL RIGHT LOWER lung opacity which may represent atelectasis or airspace disease/pneumonia. Electronically Signed   By: Harmon Pier M.D.   On: 10/11/2020 14:27    Lab Data:  CBC: Recent Labs  Lab 10/22/20 2114 10/23/20 0220 10/25/20 0920  WBC 7.6 12.8* 11.2*  HGB 12.6* 11.6* 12.7*  HCT 39.2 35.9* 38.7*  MCV 85.4 84.7 82.9  PLT 151 262 288   Basic Metabolic Panel: Recent Labs  Lab 10/22/20 2232 10/23/20 0220 10/23/20 0406 10/24/20  [id number] 10/25/20 0920  NA 144  --   --  140 138  K 4.4  --   --  4.3 4.4  CL 113*  --   --  107 106  CO2 18*  --   --  24 22  GLUCOSE 99  --   --  102* 131*  BUN 66*  --   --  46* 33*  CREATININE 3.55* 2.66*  --  1.75* 1.31*  CALCIUM 8.9  --   --  9.2 9.3  MG  --   --  1.9  --   --   PHOS  --   --  4.9*  --   --    GFR: CrCl cannot be calculated (Unknown ideal weight.). Liver Function Tests: Recent Labs  Lab 10/22/20 2232 10/25/20 0920  AST 46* 40  ALT 19 31  ALKPHOS 65 74  BILITOT 1.0 0.5  PROT 6.2* 6.2*  ALBUMIN 3.5 3.6   No results for input(s): LIPASE, AMYLASE in the last 168 hours. No results for input(s): AMMONIA in the last 168 hours. Coagulation Profile: No results for input(s): INR, PROTIME in the last 168 hours. Cardiac Enzymes: No results for input(s): CKTOTAL, CKMB, CKMBINDEX, TROPONINI in the last 168 hours. BNP (last 3 results) No results for input(s): PROBNP in the last 8760 hours. HbA1C: No results for input(s): HGBA1C in the last 72 hours. CBG: Recent Labs  Lab 10/24/20 1423 10/24/20 1751 10/24/20 2111 10/25/20 0754 10/25/20 1113  GLUCAP 165* 134* 125* 129* 164*   Lipid Profile: No results for input(s): CHOL,  HDL, LDLCALC, TRIG, CHOLHDL, LDLDIRECT in the last 72 hours. Thyroid Function Tests: No results for input(s): TSH, T4TOTAL, FREET4, T3FREE, THYROIDAB in the last 72 hours. Anemia Panel: No results for input(s): VITAMINB12, FOLATE, FERRITIN, TIBC, IRON, RETICCTPCT in the last 72 hours. Urine analysis:    Component Value Date/Time   COLORURINE STRAW (A) 10/11/2020 1618   APPEARANCEUR CLEAR 10/11/2020 1618   LABSPEC 1.005 10/11/2020 1618   PHURINE 5.0 10/11/2020 1618   GLUCOSEU >=500 (A) 10/11/2020 1618   HGBUR NEGATIVE 10/11/2020 1618   BILIRUBINUR NEGATIVE 10/11/2020 1618   KETONESUR NEGATIVE 10/11/2020 1618   PROTEINUR NEGATIVE 10/11/2020 1618   UROBILINOGEN 0.2 02/04/2015 0059   NITRITE NEGATIVE 10/11/2020 1618   LEUKOCYTESUR NEGATIVE 10/11/2020 1618     Pari Lombard M.D. [facility] 10/25/2020, 12:35 PM  Available via Epic secure chat 7am-7pm After 7 pm, please refer to night coverage provider listed on amion.

## 2020-10-26 ENCOUNTER — Other Ambulatory Visit: Payer: Self-pay

## 2020-10-26 ENCOUNTER — Inpatient Hospital Stay (HOSPITAL_COMMUNITY): Payer: Medicaid Other

## 2020-10-26 DIAGNOSIS — R5081 Fever presenting with conditions classified elsewhere: Secondary | ICD-10-CM

## 2020-10-26 LAB — GLUCOSE, CAPILLARY
Glucose-Capillary: 168 mg/dL — ABNORMAL HIGH (ref 70–99)
Glucose-Capillary: 124 mg/dL — ABNORMAL HIGH (ref 70–99)
Glucose-Capillary: 120 mg/dL — ABNORMAL HIGH (ref 70–99)
Glucose-Capillary: 172 mg/dL — ABNORMAL HIGH (ref 70–99)

## 2020-10-26 LAB — PROCALCITONIN: Procalcitonin: 0.14 ng/mL

## 2020-10-26 MED ORDER — QUETIAPINE FUMARATE 100 MG PO TABS
400.0000 mg | ORAL_TABLET | Freq: Every morning | ORAL | Status: DC
  Administered 2020-10-27 – 2020-10-28 (×2): 400 mg via ORAL
  Filled 2020-10-26 (×4): qty 4

## 2020-10-26 MED ORDER — QUETIAPINE FUMARATE 100 MG PO TABS: 400.0000 mg | ORAL_TABLET | Freq: Every morning | ORAL | Status: DC

## 2020-10-26 NOTE — Progress Notes (Signed)
[facility]                                                                              Patient Demographics  Ronald Roman, is a 60 y.o. male, DOB - 28-Jun-1960, ZOX:[id number]  Admit date - 10/22/2020   Admitting Physician Ronald Roman  Outpatient Primary MD for the patient is System, Provider Not In  Outpatient specialists:   LOS - 2  days   Medical records reviewed and are as summarized below:    Chief Complaint  Patient presents with  . Medical Clearance       Brief summary   Patient is a 60 year old male with history of schizophrenia, hypertension, diabetes mellitus presented [place] ED [place]  Patient was found to be missing from his group home for 2 days.  Per EDP, there were homicidal threats made and patient had erratic behaviors.  Patient was likely not taking his medications. In ED, patient was found to have abnormal labs, creatinine 3.5, BUN 66, serum bicarb 18, gap 13.  Patient was admitted for AKI with acute metabolic encephalopathy/psychosis  Assessment & Plan    Principal Problem:   AKI (acute kidney injury) (HCC) in the setting of dehydration, NAG metabolic acidosis -Creatinine was 1.1 on 10/14/2020 -Presented with creatinine of 3.5, BUN 66, CO2 18 -Patient was placed on IV fluid hydration, he took his IV out on 6/2, has been tolerating p.o. diet -Creatinine continues to improve, 1.3    Active Problems:   Diabetes mellitus without complication (HCC), type II, IDDM -Hemoglobin A1c 5.2 on 10/11/2020.  Noted to be on Lantus 25 units at bedtime prior to admission however unclear if he was taking it -Continue sliding scale insulin -CBG stable  Recent Labs    10/25/20 0754 10/25/20 1113 10/25/20 1637 10/25/20 2101 10/26/20 0647 10/26/20 1136  GLUCAP 129* 164* 109* 151* 168* 124*    Acute metabolic encephalopathy/psychosis/paranoid schizophrenia - Patient had  not been taking his medications, currently currently is 2 days prior to admission - Psychiatry following, does not meet inpatient criteria at this time. -Continue Seroquel 200 mg twice daily     Hypertension -BP currently stable, unclear if patient was taking lisinopril prior to admission -Continue to hold lisinopril  Nicotine use -Placed on nicotine patch  Low-grade fever - Temp 100.9 F today, no systemic signs, no chest pain, shortness of breath or diarrhea. Leukocytosis improving Will check procalcitonin, UA, chest x-ray  Code Status: Full CODE STATUS DVT Prophylaxis:  enoxaparin (LOVENOX) injection 30 mg Start: 10/23/20 1000 SCDs Start: 10/23/20 0220   Level of Care: Level of care: Med-Surg Family Communication: no family member present   Disposition Plan:     Status is: Inpatient   Inpatient level of care appropriate due to severity of illness  Dispo:  Patient From: Group Home  Planned Disposition: Group Home  Medically stable for discharge: No  Will DC back to group home once cleared by psychiatry     Time Spent in minutes 25 minutes  Procedures:  None  Consultants:   Psychiatry  Antimicrobials:   Anti-infectives (From admission, onward)   None         Medications  Scheduled Meds: . enoxaparin (LOVENOX) injection  30 mg Subcutaneous Q24H  . insulin aspart  0-5 Units Subcutaneous QHS  . insulin aspart  0-9 Units Subcutaneous TID WC  . nicotine  14 mg Transdermal Daily  . QUEtiapine  200 mg Oral BID   Continuous Infusions: . lactated ringers     PRN Meds:.acetaminophen, haloperidol lactate, melatonin, ondansetron (ZOFRAN) IV      Subjective:   Ronald Roman was seen and examined today.  No acute complaints.  No acute overnight issues.  Low-grade temp of 100.9 F.  No shortness of breath.  No other complaints no diarrhea.    Objective:   Vitals:   10/25/20 1642 10/25/20 2109 10/26/20 0632 10/26/20 0724  BP: 129/81 (!) 112/91 113/78  (!) 135/98  Pulse: (!) 108 (!) 101 95 99  Resp: 18 19 17 16   Temp: 98.7 F (37.1 C) 100.1 F (37.8 C) (!) 100.9 F (38.3 C) 98.5 F (36.9 C)  TempSrc: Oral Oral Oral Oral  SpO2: 100% 99% 100% 95%    Intake/Output Summary (Last 24 hours) at 10/26/2020 1236 Last data filed at 10/25/2020 1433 Gross per 24 hour  Intake 360 ml  Output --  Net 360 ml     Wt Readings from Last 3 Encounters:  10/09/20 88 kg  10/05/20 88 kg  05/20/20 87.5 kg   Physical Exam  General: Alert and oriented x self and place,  Cardiovascular: S1 S2 clear, RRR. No pedal edema b/l  Respiratory: Diminished breath sound at the bases, no wheezing or rhonchi  Gastrointestinal: Soft, nontender, nondistended, NBS  Ext: no pedal edema bilaterally  Neuro: no new deficits  Psych: alert and oriented to self      Data Reviewed:  I have personally reviewed following labs and imaging studies  Micro Results Recent Results (from the past 240 hour(s))  Resp Panel by RT-PCR (Flu A&B, Covid) Nasopharyngeal Swab     Status: None   Collection Time: 10/22/20  9:14 PM   Specimen: Nasopharyngeal Swab; Nasopharyngeal(NP) swabs in vial transport medium  Result Value Ref Range Status   SARS Coronavirus 2 by RT PCR NEGATIVE NEGATIVE Final    Comment: (NOTE) SARS-CoV-2 target nucleic acids are NOT DETECTED.  The SARS-CoV-2 RNA is generally detectable in upper respiratory specimens during the acute phase of infection. The lowest concentration of SARS-CoV-2 viral copies this assay can detect is 138 copies/mL. A negative result does not preclude SARS-Cov-2 infection and should not be used as the sole basis for treatment or other patient management decisions. A negative result may occur with  improper specimen collection/handling, submission of specimen other than nasopharyngeal swab, presence of viral mutation(s) within the areas targeted by this assay, and inadequate number of viral copies(<138 copies/mL). A negative  result must be combined with clinical observations, patient history, and epidemiological information. The expected result is Negative.  Fact Sheet for Patients:  12/22/20  Fact Sheet for Healthcare Providers:  BloggerCourse.com  This test is no t yet approved or cleared by the [place] FDA and  has been authorized for detection and/or diagnosis of SARS-CoV-2 by FDA under an Emergency Use Authorization (EUA). This EUA will remain  in effect (meaning this test can be used) for the duration of the COVID-19 declaration under Section 564(b)(1) of the Act, 21 U.S.C.section 360bbb-3(b)(1), unless the authorization is terminated  or revoked sooner.  Influenza A by PCR NEGATIVE NEGATIVE Final   Influenza B by PCR NEGATIVE NEGATIVE Final    Comment: (NOTE) The Xpert Xpress SARS-CoV-2/FLU/RSV plus assay is intended as an aid in the diagnosis of influenza from Nasopharyngeal swab specimens and should not be used as a sole basis for treatment. Nasal washings and aspirates are unacceptable for Xpert Xpress SARS-CoV-2/FLU/RSV testing.  Fact Sheet for Patients: BloggerCourse.com  Fact Sheet for Healthcare Providers: SeriousBroker.it  This test is not yet approved or cleared by the [place] FDA and has been authorized for detection and/or diagnosis of SARS-CoV-2 by FDA under an Emergency Use Authorization (EUA). This EUA will remain in effect (meaning this test can be used) for the duration of the COVID-19 declaration under Section 564(b)(1) of the Act, 21 U.S.C. section 360bbb-3(b)(1), unless the authorization is terminated or revoked.  Performed at [facility], [address] [place], [name] [address]     Radiology Reports CT HEAD WO CONTRAST  Result Date: 10/11/2020 CLINICAL DATA:  Mental status change. EXAM: CT HEAD WITHOUT CONTRAST TECHNIQUE:  Contiguous axial images were obtained from the base of the skull through the vertex without intravenous contrast. COMPARISON:  Oct 05, 2020 FINDINGS: Brain: No evidence of acute infarction, hemorrhage, hydrocephalus, extra-axial collection or mass lesion/mass effect. Vascular: Calcified atherosclerosis in the intracranial carotids. Skull: Normal. Negative for fracture or focal lesion. Sinuses/Orbits: No acute finding. Other: None. IMPRESSION: No acute abnormalities identified. Electronically Signed   By: Gerome Sam III M.D   On: 10/11/2020 14:16   CT Head Wo Contrast  Result Date: 10/05/2020 CLINICAL DATA:  Seizure EXAM: CT HEAD WITHOUT CONTRAST TECHNIQUE: Contiguous axial images were obtained from the base of the skull through the vertex without intravenous contrast. COMPARISON:  June 22, 2020 FINDINGS: Brain: There is slight diffuse atrophy, stable. There is no intracranial mass, hemorrhage, extra-axial fluid collection, or midline shift. Scattered foci of decreased attenuation in the centra semiovale bilaterally are stable. No acute infarct evident. Vascular: No hyperdense vessels. Foci of calcification noted in each carotid siphon. Skull: Bony calvarium appears intact. Sinuses/Orbits: Visualized paranasal sinuses are clear. Orbits appear symmetric bilaterally. Other: Visualized mastoid air cells are clear. Stable foci of apparent scarring in the scalp region appear stable. IMPRESSION: Atrophy with mild periventricular small vessel disease. No acute infarct. No mass or hemorrhage. There are foci of arterial vascular calcification. Electronically Signed   By: Bretta Bang III M.D.   On: 10/05/2020 15:55   US Renal  Result Date: 10/11/2020 CLINICAL DATA:  Renal failure EXAM: RENAL / URINARY TRACT ULTRASOUND COMPLETE COMPARISON:  None. FINDINGS: Right Kidney: Renal measurements: 10.0 x 5.1 x 4.8 cm = volume: 128 mL. Echogenicity within normal limits. No mass or hydronephrosis visualized. Left  Kidney: Renal measurements: 10.2 x 6.2 x 4.2 cm = volume: 138 mL. 8 normal echotexture. No hydronephrosis. 3.3 cm midpole cyst. Bladder: Appears normal for degree of bladder distention. Other: None. IMPRESSION: No acute findings.  No hydronephrosis. Electronically Signed   By: Charlett Nose M.D.   On: 10/11/2020 16:01   DG Chest Port 1 View  Result Date: 10/11/2020 CLINICAL DATA:  Unresponsive. EXAM: PORTABLE CHEST 1 VIEW COMPARISON:  None. FINDINGS: The cardiomediastinal silhouette is unremarkable. Increased MEDIAL RIGHT LOWER lung opacity noted. No consolidation, pleural effusion, pneumothorax or acute bony abnormality noted. IMPRESSION: Increased MEDIAL RIGHT LOWER lung opacity which may represent atelectasis or airspace disease/pneumonia. Electronically Signed   By: Harmon Pier M.D.   On: 10/11/2020 14:27  Lab Data:  CBC: Recent Labs  Lab 10/22/20 2114 10/23/20 0220 10/25/20 0920  WBC 7.6 12.8* 11.2*  HGB 12.6* 11.6* 12.7*  HCT 39.2 35.9* 38.7*  MCV 85.4 84.7 82.9  PLT 151 262 288   Basic Metabolic Panel: Recent Labs  Lab 10/22/20 2232 10/23/20 0220 10/23/20 0406 10/24/20 0721 10/25/20 0920  NA 144  --   --  140 138  K 4.4  --   --  4.3 4.4  CL 113*  --   --  107 106  CO2 18*  --   --  24 22  GLUCOSE 99  --   --  102* 131*  BUN 66*  --   --  46* 33*  CREATININE 3.55* 2.66*  --  1.75* 1.31*  CALCIUM 8.9  --   --  9.2 9.3  MG  --   --  1.9  --   --   PHOS  --   --  4.9*  --   --    GFR: CrCl cannot be calculated (Unknown ideal weight.). Liver Function Tests: Recent Labs  Lab 10/22/20 2232 10/25/20 0920  AST 46* 40  ALT 19 31  ALKPHOS 65 74  BILITOT 1.0 0.5  PROT 6.2* 6.2*  ALBUMIN 3.5 3.6   No results for input(s): LIPASE, AMYLASE in the last 168 hours. No results for input(s): AMMONIA in the last 168 hours. Coagulation Profile: No results for input(s): INR, PROTIME in the last 168 hours. Cardiac Enzymes: No results for input(s): CKTOTAL, CKMB, CKMBINDEX,  TROPONINI in the last 168 hours. BNP (last 3 results) No results for input(s): PROBNP in the last 8760 hours. HbA1C: No results for input(s): HGBA1C in the last 72 hours. CBG: Recent Labs  Lab 10/25/20 1113 10/25/20 1637 10/25/20 2101 10/26/20 0647 10/26/20 1136  GLUCAP 164* 109* 151* 168* 124*   Lipid Profile: No results for input(s): CHOL, HDL, LDLCALC, TRIG, CHOLHDL, LDLDIRECT in the last 72 hours. Thyroid Function Tests: No results for input(s): TSH, T4TOTAL, FREET4, T3FREE, THYROIDAB in the last 72 hours. Anemia Panel: No results for input(s): VITAMINB12, FOLATE, FERRITIN, TIBC, IRON, RETICCTPCT in the last 72 hours. Urine analysis:    Component Value Date/Time   COLORURINE STRAW (A) 10/11/2020 1618   APPEARANCEUR CLEAR 10/11/2020 1618   LABSPEC 1.005 10/11/2020 1618   PHURINE 5.0 10/11/2020 1618   GLUCOSEU >=500 (A) 10/11/2020 1618   HGBUR NEGATIVE 10/11/2020 1618   BILIRUBINUR NEGATIVE 10/11/2020 1618   KETONESUR NEGATIVE 10/11/2020 1618   PROTEINUR NEGATIVE 10/11/2020 1618   UROBILINOGEN 0.2 02/04/2015 0059   NITRITE NEGATIVE 10/11/2020 1618   LEUKOCYTESUR NEGATIVE 10/11/2020 1618     Consuelo Thayne M.D. [facility] 10/26/2020, 12:36 PM  Available via Epic secure chat 7am-7pm After 7 pm, please refer to night coverage provider listed on amion.

## 2020-10-26 NOTE — Consult Note (Signed)
[facility]   Reason for Consult:  Uncontrolled Schizophrenia  Referring Physician:  Dow Adolpharole Hall, DO Patient Identification: Ronald AngstBarry D Roman MRN:  [id number] Principal Diagnosis: AKI (acute kidney injury) (HCC) Diagnosis:  Principal Problem:   AKI (acute kidney injury) (HCC) Active Problems:   Diabetes mellitus without complication (HCC)   Hypertension   Paranoid schizophrenia (HCC)   Acute metabolic encephalopathy   Total Time spent with patient: 15 minutes  Subjective:   Ronald AngstBarry D Roman is a 60 y.o. male patient admitted with AKI after being reported missing for 2 days. Patient presented via [facility] after they found him. Patient has a PPH of schizophrenia.Marland Kitchen.  HPI:   Patient seen and chart reviewed. Patient refused night time seroquel yesterday but was compliant this morning. Per documentation, was wandering around the room but was less agitated. He has not received PRNs for agitation in the last 24 hours. Patient found laying in bed in NAD this afternoon. He reports his mood as "alright" and states he slept well. Pt requests food and states "I want burgers. I couldn't eat last night because the food was cold". He denies SI/HI/AVH. He denies paranoia and does not appear to be RIS. When asked about his refusal of night time seroquel doses, he is unable to provide an explanation. Pt has no other concerns or complaints apart from requesting food.      Past Psychiatric History: Schizophrenia  Risk to Self:   NO  Risk to Others:   NO Prior Inpatient [facility]:   [facility]2020 Prior Outpatient Therapy:  Yes in group home setting, [facility]  Past Medical History:  Past Medical History:  Diagnosis Date  . Diabetes mellitus without complication (HCC)   . GERD (gastroesophageal reflux disease)   . Hyperlipidemia   . Hypertension   . Paranoid schizophrenia (HCC)   . Sleep apnea   . Uric acid  nephrolithiasis    No past surgical history on file. Family History: No family history on file. Family Psychiatric  History: Unknown Social History:  Social History   Substance and Sexual Activity  Alcohol Use No     Social History   Substance and Sexual Activity  Drug Use No    Social History   Socioeconomic History  . Marital status: Single    Spouse name: Not on file  . Number of children: Not on file  . Years of education: Not on file  . Highest education level: Not on file  Occupational History  . Not on file  Tobacco Use  . Smoking status: Current Every Day Smoker    Packs/day: 1.00    Types: Cigarettes  . Smokeless tobacco: Never Used  Substance and Sexual Activity  . Alcohol use: No  . Drug use: No  . Sexual activity: Not Currently  Other Topics Concern  . Not on file  Social History Narrative  . Not on file   Social Determinants of Health   Financial Resource Strain: Not on file  Food Insecurity: Not on file  Transportation Needs: Not on file  Physical Activity: Not on file  Stress: Not on file  Social Connections: Not on file   Additional Social History:    Allergies:   Allergies  Allergen Reactions  . Cogentin [Benztropine] Other (See Comments)    Per MAR   . Penicillins Other (See Comments)    Per MAR - unable to verify patients PCN reaction   .  Prolixin [Fluphenazine] Other (See Comments)    Per MAR     Labs:  Results for orders placed or performed during the hospital encounter of 10/22/20 (from the past 48 hour(s))  Glucose, capillary     Status: Abnormal   Collection Time: 10/24/20  5:51 PM  Result Value Ref Range   Glucose-Capillary 134 (H) 70 - 99 mg/dL    Comment: Glucose reference range applies only to samples taken after fasting for at least 8 hours.  Glucose, capillary     Status: Abnormal   Collection Time: 10/24/20  9:11 PM  Result Value Ref Range   Glucose-Capillary 125 (H) 70 - 99 mg/dL    Comment: Glucose reference  range applies only to samples taken after fasting for at least 8 hours.  Glucose, capillary     Status: Abnormal   Collection Time: 10/25/20  7:54 AM  Result Value Ref Range   Glucose-Capillary 129 (H) 70 - 99 mg/dL    Comment: Glucose reference range applies only to samples taken after fasting for at least 8 hours.  CBC     Status: Abnormal   Collection Time: 10/25/20  9:20 AM  Result Value Ref Range   WBC 11.2 (H) 4.0 - 10.5 K/uL   RBC 4.67 4.22 - 5.81 MIL/uL   Hemoglobin 12.7 (L) 13.0 - 17.0 g/dL   HCT 01.0 (L) 27.2 - 53.6 %   MCV 82.9 80.0 - 100.0 fL   MCH 27.2 26.0 - 34.0 pg   MCHC 32.8 30.0 - 36.0 g/dL   RDW 64.4 03.4 - 74.2 %   Platelets 288 150 - 400 K/uL   nRBC 0.0 0.0 - 0.2 %    Comment: Performed at [facility], [address], [place], [name] [address]  Comprehensive metabolic panel     Status: Abnormal   Collection Time: 10/25/20  9:20 AM  Result Value Ref Range   Sodium 138 135 - 145 mmol/L   Potassium 4.4 3.5 - 5.1 mmol/L   Chloride 106 98 - 111 mmol/L   CO2 22 22 - 32 mmol/L   Glucose, Bld 131 (H) 70 - 99 mg/dL    Comment: Glucose reference range applies only to samples taken after fasting for at least 8 hours.   BUN 33 (H) 6 - 20 mg/dL   Creatinine, Ser 8.75 (H) 0.61 - 1.24 mg/dL   Calcium 9.3 8.9 - 64.3 mg/dL   Total Protein 6.2 (L) 6.5 - 8.1 g/dL   Albumin 3.6 3.5 - 5.0 g/dL   AST 40 15 - 41 U/L   ALT 31 0 - 44 U/L   Alkaline Phosphatase 74 38 - 126 U/L   Total Bilirubin 0.5 0.3 - 1.2 mg/dL   GFR, Estimated >32 >95 mL/min    Comment: (NOTE) Calculated using the CKD-EPI Creatinine Equation (2021)    Anion gap 10 5 - 15    Comment: Performed at [facility], [address], [place], [name] [address]  Glucose, capillary     Status: Abnormal   Collection Time: 10/25/20 11:13 AM  Result Value Ref Range   Glucose-Capillary 164 (H) 70 - 99 mg/dL    Comment: Glucose reference range applies only to samples taken after fasting for at least 8  hours.  Glucose, capillary     Status: Abnormal   Collection Time: 10/25/20  4:37 PM  Result Value Ref Range   Glucose-Capillary 109 (H) 70 - 99 mg/dL    Comment: Glucose reference  range applies only to samples taken after fasting for at least 8 hours.  Glucose, capillary     Status: Abnormal   Collection Time: 10/25/20  9:01 PM  Result Value Ref Range   Glucose-Capillary 151 (H) 70 - 99 mg/dL    Comment: Glucose reference range applies only to samples taken after fasting for at least 8 hours.  Glucose, capillary     Status: Abnormal   Collection Time: 10/26/20  6:47 AM  Result Value Ref Range   Glucose-Capillary 168 (H) 70 - 99 mg/dL    Comment: Glucose reference range applies only to samples taken after fasting for at least 8 hours.  Glucose, capillary     Status: Abnormal   Collection Time: 10/26/20 11:36 AM  Result Value Ref Range   Glucose-Capillary 124 (H) 70 - 99 mg/dL    Comment: Glucose reference range applies only to samples taken after fasting for at least 8 hours.  Procalcitonin - Baseline     Status: None   Collection Time: 10/26/20  1:11 PM  Result Value Ref Range   Procalcitonin 0.14 ng/mL    Comment:        Interpretation: PCT (Procalcitonin) <= 0.5 ng/mL: Systemic infection (sepsis) is not likely. Local bacterial infection is possible. (NOTE)       Sepsis PCT Algorithm           Lower Respiratory Tract                                      Infection PCT Algorithm    ----------------------------     ----------------------------         PCT < 0.25 ng/mL                PCT < 0.10 ng/mL          Strongly encourage             Strongly discourage   discontinuation of antibiotics    initiation of antibiotics    ----------------------------     -----------------------------       PCT 0.25 - 0.50 ng/mL            PCT 0.10 - 0.25 ng/mL               OR       >80% decrease in PCT            Discourage initiation of                                             antibiotics      Encourage discontinuation           of antibiotics    ----------------------------     -----------------------------         PCT >= 0.50 ng/mL              PCT 0.26 - 0.50 ng/mL               AND        <80% decrease in PCT             Encourage initiation of  antibiotics       Encourage continuation           of antibiotics    ----------------------------     -----------------------------        PCT >= 0.50 ng/mL                  PCT > 0.50 ng/mL               AND         increase in PCT                  Strongly encourage                                      initiation of antibiotics    Strongly encourage escalation           of antibiotics                                     -----------------------------                                           PCT <= 0.25 ng/mL                                                 OR                                        > 80% decrease in PCT                                      Discontinue / Do not initiate                                             antibiotics  Performed at [facility], [address] [place], [name] [address]     Current Facility-Administered Medications  Medication Dose Route Frequency Provider Last Rate Last Admin  . acetaminophen (TYLENOL) tablet 500 mg  500 mg Oral Q6H PRN Dow Adolph N, DO      . enoxaparin (LOVENOX) injection 30 mg  30 mg Subcutaneous Q24H Hall, Carole N, DO   30 mg at 10/26/20 0815  . haloperidol lactate (HALDOL) injection 5 mg  5 mg Intramuscular Q6H PRN Marikay Alar, FNP      . insulin aspart (novoLOG) injection 0-5 Units  0-5 Units Subcutaneous QHS Rai, Ripudeep K, MD      . insulin aspart (novoLOG) injection 0-9 Units  0-9 Units Subcutaneous TID WC Rai, Ripudeep K, MD   2 Units at 10/26/20 0814  . lactated ringers infusion   Intravenous Continuous Rai, Ripudeep K, MD      . melatonin tablet 3 mg  3 mg Oral QHS PRN Margo Aye,  Oliver Pila, DO      . nicotine (NICODERM CQ - dosed in mg/24 hours) patch 14 mg  14 mg Transdermal Daily Rai, Ripudeep K, MD   14 mg at 10/26/20 0815  . ondansetron (ZOFRAN) injection 4 mg  4 mg Intravenous Q6H PRN Margo Aye, Carole N, DO      . QUEtiapine (SEROQUEL) tablet 200 mg  200 mg Oral BID Eliseo Gum B, MD   200 mg at 10/26/20 6629    Musculoskeletal: Strength & Muscle Tone: within normal limits Gait & Station: remains sitting on bed Patient leans: N/A            Psychiatric Specialty Exam:  Presentation  General Appearance: Appropriate for Environment  Eye Contact:Minimal  Speech:Clear and Coherent  Speech Volume:Normal  Handedness:Right   Mood and Affect  Mood:Euthymic  Affect:Appropriate; Congruent   Thought Process  Thought Processes:Linear  Descriptions of Associations:Circumstantial  Orientation:Other (comment) (oriented to person and place)  Thought Content:Delusions  History of Schizophrenia/Schizoaffective disorder:Yes  Duration of Psychotic Symptoms:Greater than six months  Hallucinations:Hallucinations: None  Ideas of Reference:None  Suicidal Thoughts:Suicidal Thoughts: No  Homicidal Thoughts:Homicidal Thoughts: No   Sensorium  Memory:Immediate Poor; Recent Poor; Remote Poor  Judgment:Impaired  Insight:Lacking   Executive Functions  Concentration:Poor  Attention Span:Poor  Recall:Poor  Fund of Knowledge:Poor  Language:Fair   Psychomotor Activity  Psychomotor Activity:Psychomotor Activity: Normal   Assets  Assets:Resilience; Housing   Sleep  Sleep:Sleep: Fair   Physical Exam: Physical Exam Constitutional:      Appearance: Normal appearance.  HENT:     Head: Normocephalic and atraumatic.  Eyes:     Extraocular Movements: Extraocular movements intact.     Conjunctiva/sclera: Conjunctivae normal.  Pulmonary:     Effort: Pulmonary effort is normal.  Neurological:     Mental Status: He is alert.    Review  of Systems  Constitutional: Negative for chills and fever.  HENT: Negative for hearing loss.   Eyes: Negative for blurred vision.  Respiratory: Negative for cough and wheezing.   Cardiovascular: Negative for chest pain.  Gastrointestinal: Negative for abdominal pain.  Neurological: Negative for dizziness.  Psychiatric/Behavioral: Negative for depression and suicidal ideas.   Blood pressure 98/82, pulse (!) 57, temperature 97.7 F (36.5 C), temperature source Oral, resp. rate 14, SpO2 93 %. There is no height or weight on file to calculate BMI.  Treatment Plan Summary: Daily contact with patient to assess and evaluate symptoms and progress in treatment Schizophrenia   Recommendations  - adjust Seroquel 200mg  BID to 400 mg daily as patient has been declining night time dose - Recommend reaching back out to patient's group home for dispo planning -consider PRN for agitation if he continues to present with agitation  Communicated recommendations to Dr. via epic secure chat  Psych will reassess in the AM.  Disposition: No evidence of imminent risk to self or others at present.    per primary   Isidoro Donning, MD Attending psychiatrist 10/26/2020 3:08 PM

## 2020-10-26 NOTE — Plan of Care (Signed)
  Problem: Health Behavior/Discharge Planning: Goal: Ability to manage health-related needs will improve Outcome: Progressing   Problem: Clinical Measurements: Goal: Ability to maintain clinical measurements within normal limits will improve Outcome: Progressing   

## 2020-10-26 NOTE — Progress Notes (Signed)
Pt given urine specimen cup and asked to give sample next time he used restroom and to please let nurse know.

## 2020-10-26 NOTE — Progress Notes (Signed)
Patient more agitated and uncooperative tonight. Patient was wandering the halls, around 2045, shirtless making noises and calling out to people who weren't there. Saying something about a person named Lyn Hollingshead and that Maureen Ralphs was coming to pick him up and that he had to go. Conversation with patient was incoherent and difficult to follow. Security had to be called to escort patient back to his room and PRN haldol was given IM at 2103 for agitation. Patient agitation has decreased since. Dr. Andi Devon Blunt was notified about the patient and the events at 2106. MD called me back and instructed to call back if the haldol was ineffective.

## 2020-10-27 LAB — URINALYSIS, ROUTINE W REFLEX MICROSCOPIC
Bilirubin Urine: NEGATIVE
Glucose, UA: >=500 mg/dL — AB
Hgb urine dipstick: NEGATIVE
Ketones, ur: NEGATIVE mg/dL
Leukocytes,Ua: NEGATIVE
Nitrite: NEGATIVE
Protein, ur: NEGATIVE mg/dL
Specific Gravity, Urine: 1.01 (ref 1.005–1.030)
pH: 5 (ref 5.0–8.0)

## 2020-10-27 LAB — HEMOGLOBIN A1C
Hgb A1c MFr Bld: 5.2 % (ref 4.8–5.6)
Hgb A1c MFr Bld: 5.3 % (ref 4.8–5.6)
Mean Plasma Glucose: 103 mg/dL
Mean Plasma Glucose: 105 mg/dL

## 2020-10-27 LAB — GLUCOSE, CAPILLARY
Glucose-Capillary: 113 mg/dL — ABNORMAL HIGH (ref 70–99)
Glucose-Capillary: 123 mg/dL — ABNORMAL HIGH (ref 70–99)
Glucose-Capillary: 171 mg/dL — ABNORMAL HIGH (ref 70–99)
Glucose-Capillary: 158 mg/dL — ABNORMAL HIGH (ref 70–99)

## 2020-10-27 MED ORDER — AMLODIPINE BESYLATE 5 MG PO TABS
5.0000 mg | ORAL_TABLET | Freq: Every day | ORAL | Status: DC
  Administered 2020-10-27 – 2020-10-28 (×2): 5 mg via ORAL
  Filled 2020-10-27 (×2): qty 1

## 2020-10-27 NOTE — Progress Notes (Signed)
[facility]                                                                              Patient Demographics  Ronald Roman, is a 60 y.o. male, DOB - 1960-08-30, FOY:[id number]  Admit date - 10/22/2020   Admitting Physician [name], DO  Outpatient Primary MD for the patient is System, Provider Not In  Outpatient specialists:   LOS - 3  days   Medical records reviewed and are as summarized below:    Chief Complaint  Patient presents with  . Medical Clearance       Brief summary   Patient is a 60 year old male with history of schizophrenia, hypertension, diabetes mellitus presented to ED after being picked up on the street by [facility] police due to abnormal behavior.  Patient was found to be missing from his group home for 2 days.  Per EDP, there were homicidal threats made and patient had erratic behaviors.  Patient was likely not taking his medications. In ED, patient was found to have abnormal labs, creatinine 3.5, BUN 66, serum bicarb 18, gap 13.  Patient was admitted for AKI with acute metabolic encephalopathy/psychosis  Assessment & Plan    Principal Problem:   AKI (acute kidney injury) (HCC) in the setting of dehydration, NAG metabolic acidosis -Creatinine was 1.1 on 10/14/2020 -Presented with creatinine of 3.5, BUN 66, CO2 18 -Patient was placed on IV fluid hydration, he took his IV out on 6/2, has been tolerating p.o. diet -Creatinine has continued to improve, 1.3 on 6/4   Active Problems:   Diabetes mellitus without complication (HCC), type II, IDDM -Hemoglobin A1c 5.2 on 10/11/2020.  Noted to be on Lantus 25 units at bedtime prior to admission however unclear if he was taking it -CBGs has remained stable.  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 as patient has been intermittently refusing twice daily dosings     Hypertension -Lisinopril was held due to acute kidney injury -BP readings has been elevated, placed on Norvasc 5 mg daily  Nicotine use -Placed on nicotine patch  Low-grade fever - Temp 100.9 F on 6/5, no systemic signs, no chest pain, shortness of breath or diarrhea. -UA negative for UTI -Chest x-ray clear, no pneumonia, afebrile since then  Code Status: Full CODE STATUS DVT Prophylaxis:  enoxaparin (LOVENOX) injection 30 mg Start: 10/23/20 1000 SCDs Start: 10/23/20 0220   Level of Care: Level of care: Med-Surg Family Communication: no family member present   Disposition Plan:     Status is: Inpatient   Inpatient level of care appropriate due to severity of illness  Dispo:  Patient From: Group Home  Planned Disposition: Group Home  Medically stable for discharge: No, hopefully DC to group home in a.m. if remains stable      Time Spent in  minutes 25 minutes Procedures:  None  Consultants:   Psychiatry  Antimicrobials:   Anti-infectives (From admission, onward)   None         Medications  Scheduled Meds: . enoxaparin (LOVENOX) injection  30 mg Subcutaneous Q24H  . insulin aspart  0-5 Units Subcutaneous QHS  . insulin aspart  0-9 Units Subcutaneous TID WC  . nicotine  14 mg Transdermal Daily  . QUEtiapine  400 mg Oral q AM   Continuous Infusions: . lactated ringers     PRN Meds:.acetaminophen, haloperidol lactate, melatonin, ondansetron (ZOFRAN) IV      Subjective:   Joedy Eickhoff was seen and examined today.  BP has been running somewhat elevated.  No fevers today.  Ambulating in the room without any difficulty.  No acute chest pain, shortness of breath, nausea or vomiting.  Objective:   Vitals:    10/26/20 1508 10/26/20 2108 10/27/20 0600 10/27/20 0746  BP: 98/82 (!) 158/81 137/77 (!) 148/84  Pulse: (!) 57 (!) 109 96 (!) 106  Resp: 14 19 17 17   Temp: 97.7 F (36.5 C)  99.6 F (37.6 C) 97.6 F (36.4 C)  TempSrc: Oral Other (Comment) Oral Oral  SpO2: 93% 100% 100% 100%   No intake or output data in the 24 hours ending 10/27/20 1203   Wt Readings from Last 3 Encounters:  10/09/20 88 kg  10/05/20 88 kg  05/20/20 87.5 kg   Physical Exam  General: Alert and oriented to self and place, NAD, ambulating in the room without any difficulty  Cardiovascular: S1 S2 clear, RRR. No pedal edema b/l  Respiratory: CTA B  Gastrointestinal: Soft, nontender, nondistended, NBS  Ext: no pedal edema bilaterally  Neuro: moving all 4 extremities, walking in the room  Psych:, calm and cooperative during examination       Data Reviewed:  I have personally reviewed following labs and imaging studies  Micro Results Recent Results (from the past 240 hour(s))  Resp Panel by RT-PCR (Flu A&B, Covid) Nasopharyngeal Swab     Status: None   Collection Time: 10/22/20  9:14 PM   Specimen: Nasopharyngeal Swab; Nasopharyngeal(NP) swabs in vial transport medium  Result Value Ref Range Status   SARS Coronavirus 2 by RT PCR NEGATIVE NEGATIVE Final    Comment: (NOTE) SARS-CoV-2 target nucleic acids are NOT DETECTED.  The SARS-CoV-2 RNA is generally detectable in upper respiratory specimens during the acute phase of infection. The lowest concentration of SARS-CoV-2 viral copies this assay can detect is 138 copies/mL. A negative result does not preclude SARS-Cov-2 infection and should not be used as the sole basis for treatment or other patient management decisions. A negative result may occur with  improper specimen collection/handling, submission of specimen other than nasopharyngeal swab, presence of viral mutation(s) within the areas targeted by this assay, and inadequate number of  viral copies(<138 copies/mL). A negative result must be combined with clinical observations, patient history, and epidemiological information. The expected result is Negative.  Fact Sheet for Patients:  12/22/20  Fact Sheet for Healthcare Providers:  BloggerCourse.com  This test is no t yet approved or cleared by the [place] FDA and  has been authorized for detection and/or diagnosis of SARS-CoV-2 by FDA under an Emergency Use Authorization (EUA). This EUA will remain  in effect (meaning this test can be used) for the duration of the COVID-19 declaration under Section 564(b)(1) of the Act, 21 U.S.C.section 360bbb-3(b)(1), unless the authorization is terminated  or revoked sooner.  Influenza A by PCR NEGATIVE NEGATIVE Final   Influenza B by PCR NEGATIVE NEGATIVE Final    Comment: (NOTE) The Xpert Xpress SARS-CoV-2/FLU/RSV plus assay is intended as an aid in the diagnosis of influenza from Nasopharyngeal swab specimens and should not be used as a sole basis for treatment. Nasal washings and aspirates are unacceptable for Xpert Xpress SARS-CoV-2/FLU/RSV testing.  Fact Sheet for Patients: BloggerCourse.comhttps://www.fda.gov/media/152166/download  Fact Sheet for Healthcare Providers: SeriousBroker.ithttps://www.fda.gov/media/152162/download  This test is not yet approved or cleared by the [place] FDA and has been authorized for detection and/or diagnosis of SARS-CoV-2 by FDA under an Emergency Use Authorization (EUA). This EUA will remain in effect (meaning this test can be used) for the duration of the COVID-19 declaration under Section 564(b)(1) of the Act, 21 U.S.C. section 360bbb-3(b)(1), unless the authorization is terminated or revoked.  Performed at [facility], [address] [place], [place] [address]     Radiology Reports CT HEAD WO CONTRAST  Result Date: 10/11/2020 CLINICAL DATA:  Mental status change. EXAM:  CT HEAD WITHOUT CONTRAST TECHNIQUE: Contiguous axial images were obtained from the base of the skull through the vertex without intravenous contrast. COMPARISON:  Oct 05, 2020 FINDINGS: Brain: No evidence of acute infarction, hemorrhage, hydrocephalus, extra-axial collection or mass lesion/mass effect. Vascular: Calcified atherosclerosis in the intracranial carotids. Skull: Normal. Negative for fracture or focal lesion. Sinuses/Orbits: No acute finding. Other: None. IMPRESSION: No acute abnormalities identified. Electronically Signed   By: Gerome Samavid  Williams III M.D   On: 10/11/2020 14:16   CT Head Wo Contrast  Result Date: 10/05/2020 CLINICAL DATA:  Seizure EXAM: CT HEAD WITHOUT CONTRAST TECHNIQUE: Contiguous axial images were obtained from the base of the skull through the vertex without intravenous contrast. COMPARISON:  June 22, 2020 FINDINGS: Brain: There is slight diffuse atrophy, stable. There is no intracranial mass, hemorrhage, extra-axial fluid collection, or midline shift. Scattered foci of decreased attenuation in the centra semiovale bilaterally are stable. No acute infarct evident. Vascular: No hyperdense vessels. Foci of calcification noted in each carotid siphon. Skull: Bony calvarium appears intact. Sinuses/Orbits: Visualized paranasal sinuses are clear. Orbits appear symmetric bilaterally. Other: Visualized mastoid air cells are clear. Stable foci of apparent scarring in the scalp region appear stable. IMPRESSION: Atrophy with mild periventricular small vessel disease. No acute infarct. No mass or hemorrhage. There are foci of arterial vascular calcification. Electronically Signed   By: Bretta BangWilliam  Woodruff III M.D.   On: 10/05/2020 15:55   US Renal  Result Date: 10/11/2020 CLINICAL DATA:  Renal failure EXAM: RENAL / URINARY TRACT ULTRASOUND COMPLETE COMPARISON:  None. FINDINGS: Right Kidney: Renal measurements: 10.0 x 5.1 x 4.8 cm = volume: 128 mL. Echogenicity within normal limits. No mass  or hydronephrosis visualized. Left Kidney: Renal measurements: 10.2 x 6.2 x 4.2 cm = volume: 138 mL. 8 normal echotexture. No hydronephrosis. 3.3 cm midpole cyst. Bladder: Appears normal for degree of bladder distention. Other: None. IMPRESSION: No acute findings.  No hydronephrosis. Electronically Signed   By: Charlett NoseKevin  Dover M.D.   On: 10/11/2020 16:01   DG CHEST PORT 1 VIEW  Result Date: 10/26/2020 CLINICAL DATA:  Fever EXAM: PORTABLE CHEST 1 VIEW COMPARISON:  Oct 11, 2020 FINDINGS: The lungs are clear. Heart size and pulmonary vascularity are normal. No adenopathy. No bone lesions. IMPRESSION: Lungs clear.  Cardiac silhouette within normal limits. Electronically Signed   By: Bretta BangWilliam  Woodruff III M.D.   On: 10/26/2020 14:18   [facility]  Result  Date: 10/11/2020 CLINICAL DATA:  Unresponsive. EXAM: PORTABLE CHEST 1 VIEW COMPARISON:  None. FINDINGS: The cardiomediastinal silhouette is unremarkable. Increased MEDIAL RIGHT LOWER lung opacity noted. No consolidation, pleural effusion, pneumothorax or acute bony abnormality noted. IMPRESSION: Increased MEDIAL RIGHT LOWER lung opacity which may represent atelectasis or airspace disease/pneumonia. Electronically Signed   By: Harmon Pier M.D.   On: 10/11/2020 14:27    Lab Data:  CBC: Recent Labs  Lab 10/22/20 2114 10/23/20 0220 10/25/20 0920  WBC 7.6 12.8* 11.2*  HGB 12.6* 11.6* 12.7*  HCT 39.2 35.9* 38.7*  MCV 85.4 84.7 82.9  PLT 151 262 288   Basic Metabolic Panel: Recent Labs  Lab 10/22/20 2232 10/23/20 0220 10/23/20 0406 10/24/20 0721 10/25/20 0920  NA 144  --   --  140 138  K 4.4  --   --  4.3 4.4  CL 113*  --   --  107 106  CO2 18*  --   --  24 22  GLUCOSE 99  --   --  102* 131*  BUN 66*  --   --  46* 33*  CREATININE 3.55* 2.66*  --  1.75* 1.31*  CALCIUM 8.9  --   --  9.2 9.3  MG  --   --  1.9  --   --   PHOS  --   --  4.9*  --   --    GFR: CrCl cannot be calculated (Unknown ideal weight.). Liver Function  Tests: Recent Labs  Lab 10/22/20 2232 10/25/20 0920  AST 46* 40  ALT 19 31  ALKPHOS 65 74  BILITOT 1.0 0.5  PROT 6.2* 6.2*  ALBUMIN 3.5 3.6   No results for input(s): LIPASE, AMYLASE in the last 168 hours. No results for input(s): AMMONIA in the last 168 hours. Coagulation Profile: No results for input(s): INR, PROTIME in the last 168 hours. Cardiac Enzymes: No results for input(s): CKTOTAL, CKMB, CKMBINDEX, TROPONINI in the last 168 hours. BNP (last 3 results) No results for input(s): PROBNP in the last 8760 hours. HbA1C: Recent Labs    10/25/20 0920  HGBA1C 5.3   CBG: Recent Labs  Lab 10/26/20 1136 10/26/20 1628 10/26/20 2106 10/27/20 0654 10/27/20 1117  GLUCAP 124* 120* 172* 113* 123*   Lipid Profile: No results for input(s): CHOL, HDL, LDLCALC, TRIG, CHOLHDL, LDLDIRECT in the last 72 hours. Thyroid Function Tests: No results for input(s): TSH, T4TOTAL, FREET4, T3FREE, THYROIDAB in the last 72 hours. Anemia Panel: No results for input(s): VITAMINB12, FOLATE, FERRITIN, TIBC, IRON, RETICCTPCT in the last 72 hours. Urine analysis:    Component Value Date/Time   COLORURINE YELLOW 10/27/2020 0632   APPEARANCEUR CLEAR 10/27/2020 0632   LABSPEC 1.010 10/27/2020 0632   PHURINE 5.0 10/27/2020 0632   GLUCOSEU >=500 (A) 10/27/2020 0632   HGBUR NEGATIVE 10/27/2020 0632   BILIRUBINUR NEGATIVE 10/27/2020 0632   KETONESUR NEGATIVE 10/27/2020 0632   PROTEINUR NEGATIVE 10/27/2020 0632   UROBILINOGEN 0.2 02/04/2015 0059   NITRITE NEGATIVE 10/27/2020 0632   LEUKOCYTESUR NEGATIVE 10/27/2020 1610     Beldon Nowling M.D. [facility] 10/27/2020, 12:03 PM  Available via Epic secure chat 7am-7pm After 7 pm, please refer to night coverage provider listed on amion.

## 2020-10-27 NOTE — Consult Note (Signed)
[facility]   Reason for Consult:  Uncontrolled Schizophrenia Referring Physician:  Dow Adolpharole Hall, DO Patient Identification: Osvaldo AngstBarry D Hoeffner MRN:  [id number] Principal Diagnosis: AKI (acute kidney injury) (HCC) Diagnosis:  Principal Problem:   AKI (acute kidney injury) (HCC) Active Problems:   Diabetes mellitus without complication (HCC)   Hypertension   Paranoid schizophrenia (HCC)   Acute metabolic encephalopathy   Total Time spent with patient: 15 minutes  Subjective:   Osvaldo AngstBarry D Wigen is a 60 y.o. male patient admitted with AKI after being reported missing for 2 days.Patient presented via [facility] after they found him. Patient has a PPH of schizophrenia.  HPI:   Patient did not receive a total of 400mg  Seroquel 6/5, missing the QHS dosing. Overnight patient was more agitated and wandering the halls and required PRN Haldol 5mg  IM.   On assessment this AM patient's bed did not have covers and per EVS staff patient had left the bathroom very messy after a bowel movement. Patient himself was sitting up on the edge of his bed rubbing his arms and reported he was "looking for my veins." Patient denies SI, HI, and AVH. Patient reported he was enjoying watching "hospital TV." Patient continue to report he has moved to another group home and gives another address.  Past Psychiatric History:Schizophrenia  Risk to Self:NO Risk to Others:NO [facility] and [facility]/2020 Prior Outpatient Therapy:Yes in group home setting, [facility]  Past Medical History:  Past Medical History:  Diagnosis Date  . Diabetes mellitus without complication (HCC)   . GERD (gastroesophageal reflux disease)   . Hyperlipidemia   . Hypertension   . Paranoid schizophrenia (HCC)   . Sleep apnea   . Uric acid nephrolithiasis    No past surgical history on file. Family History: No family history on  file. Family Psychiatric  History: Unknown Social History:  Social History   Substance and Sexual Activity  Alcohol Use No     Social History   Substance and Sexual Activity  Drug Use No    Social History   Socioeconomic History  . Marital status: Single    Spouse name: Not on file  . Number of children: Not on file  . Years of education: Not on file  . Highest education level: Not on file  Occupational History  . Not on file  Tobacco Use  . Smoking status: Current Every Day Smoker    Packs/day: 1.00    Types: Cigarettes  . Smokeless tobacco: Never Used  Substance and Sexual Activity  . Alcohol use: No  . Drug use: No  . Sexual activity: Not Currently  Other Topics Concern  . Not on file  Social History Narrative  . Not on file   Social Determinants of Health   Financial Resource Strain: Not on file  Food Insecurity: Not on file  Transportation Needs: Not on file  Physical Activity: Not on file  Stress: Not on file  Social Connections: Not on file   Additional Social History:    Allergies:   Allergies  Allergen Reactions  . Cogentin [Benztropine] Other (See Comments)    Per MAR   . Penicillins Other (See Comments)    Per MAR - unable to verify patients PCN reaction   . Prolixin [Fluphenazine] Other (See Comments)    Per MAR     Labs:  Results for orders placed or performed during the hospital encounter of 10/22/20 (  from the past 48 hour(s))  Glucose, capillary     Status: Abnormal   Collection Time: 10/25/20  4:37 PM  Result Value Ref Range   Glucose-Capillary 109 (H) 70 - 99 mg/dL    Comment: Glucose reference range applies only to samples taken after fasting for at least 8 hours.  Glucose, capillary     Status: Abnormal   Collection Time: 10/25/20  9:01 PM  Result Value Ref Range   Glucose-Capillary 151 (H) 70 - 99 mg/dL    Comment: Glucose reference range applies only to samples taken after fasting for at least 8 hours.  Glucose, capillary      Status: Abnormal   Collection Time: 10/26/20  6:47 AM  Result Value Ref Range   Glucose-Capillary 168 (H) 70 - 99 mg/dL    Comment: Glucose reference range applies only to samples taken after fasting for at least 8 hours.  Glucose, capillary     Status: Abnormal   Collection Time: 10/26/20 11:36 AM  Result Value Ref Range   Glucose-Capillary 124 (H) 70 - 99 mg/dL    Comment: Glucose reference range applies only to samples taken after fasting for at least 8 hours.  Procalcitonin - Baseline     Status: None   Collection Time: 10/26/20  1:11 PM  Result Value Ref Range   Procalcitonin 0.14 ng/mL    Comment:        Interpretation: PCT (Procalcitonin) <= 0.5 ng/mL: Systemic infection (sepsis) is not likely. Local bacterial infection is possible. (NOTE)       Sepsis PCT Algorithm           Lower Respiratory Tract                                      Infection PCT Algorithm    ----------------------------     ----------------------------         PCT < 0.25 ng/mL                PCT < 0.10 ng/mL          Strongly encourage             Strongly discourage   discontinuation of antibiotics    initiation of antibiotics    ----------------------------     -----------------------------       PCT 0.25 - 0.50 ng/mL            PCT 0.10 - 0.25 ng/mL               OR       >80% decrease in PCT            Discourage initiation of                                            antibiotics      Encourage discontinuation           of antibiotics    ----------------------------     -----------------------------         PCT >= 0.50 ng/mL              PCT 0.26 - 0.50 ng/mL               AND        <80%  decrease in PCT             Encourage initiation of                                             antibiotics       Encourage continuation           of antibiotics    ----------------------------     -----------------------------        PCT >= 0.50 ng/mL                  PCT > 0.50 ng/mL               AND          increase in PCT                  Strongly encourage                                      initiation of antibiotics    Strongly encourage escalation           of antibiotics                                     -----------------------------                                           PCT <= 0.25 ng/mL                                                 OR                                        > 80% decrease in PCT                                      Discontinue / Do not initiate                                             antibiotics  Performed at [facility], [address] [place], [name] [address]   Glucose, capillary     Status: Abnormal   Collection Time: 10/26/20  4:28 PM  Result Value Ref Range   Glucose-Capillary 120 (H) 70 - 99 mg/dL    Comment: Glucose reference range applies only to samples taken after fasting for at least 8 hours.  Glucose, capillary     Status: Abnormal   Collection Time: 10/26/20  9:06 PM  Result Value Ref Range   Glucose-Capillary 172 (H) 70 - 99 mg/dL    Comment: Glucose reference range applies only to samples taken after  fasting for at least 8 hours.  Urinalysis, Routine w reflex microscopic Urine, Clean Catch     Status: Abnormal   Collection Time: 10/27/20  6:32 AM  Result Value Ref Range   Color, Urine YELLOW YELLOW   APPearance CLEAR CLEAR   Specific Gravity, Urine 1.010 1.005 - 1.030   pH 5.0 5.0 - 8.0   Glucose, UA >=500 (A) NEGATIVE mg/dL   Hgb urine dipstick NEGATIVE NEGATIVE   Bilirubin Urine NEGATIVE NEGATIVE   Ketones, ur NEGATIVE NEGATIVE mg/dL   Protein, ur NEGATIVE NEGATIVE mg/dL   Nitrite NEGATIVE NEGATIVE   Leukocytes,Ua NEGATIVE NEGATIVE   RBC / HPF 0-5 0 - 5 RBC/hpf   WBC, UA 0-5 0 - 5 WBC/hpf   Bacteria, UA RARE (A) NONE SEEN   Mucus PRESENT     Comment: Performed at [facility], [address], [place], [name] [address]  Glucose, capillary     Status: Abnormal   Collection Time: 10/27/20  6:54 AM   Result Value Ref Range   Glucose-Capillary 113 (H) 70 - 99 mg/dL    Comment: Glucose reference range applies only to samples taken after fasting for at least 8 hours.  Glucose, capillary     Status: Abnormal   Collection Time: 10/27/20 11:17 AM  Result Value Ref Range   Glucose-Capillary 123 (H) 70 - 99 mg/dL    Comment: Glucose reference range applies only to samples taken after fasting for at least 8 hours.    Current Facility-Administered Medications  Medication Dose Route Frequency Provider Last Rate Last Admin  . acetaminophen (TYLENOL) tablet 500 mg  500 mg Oral Q6H PRN Dow Adolph N, DO      . amLODipine (NORVASC) tablet 5 mg  5 mg Oral Daily Rai, Ripudeep K, MD      . enoxaparin (LOVENOX) injection 30 mg  30 mg Subcutaneous Q24H Hall, Carole N, DO   30 mg at 10/27/20 0804  . haloperidol lactate (HALDOL) injection 5 mg  5 mg Intramuscular Q6H PRN Marikay Alar, FNP   5 mg at 10/26/20 2103  . insulin aspart (novoLOG) injection 0-5 Units  0-5 Units Subcutaneous QHS Rai, Ripudeep K, MD      . insulin aspart (novoLOG) injection 0-9 Units  0-9 Units Subcutaneous TID WC Rai, Ripudeep K, MD   1 Units at 10/27/20 1156  . melatonin tablet 3 mg  3 mg Oral QHS PRN Dow Adolph N, DO      . nicotine (NICODERM CQ - dosed in mg/24 hours) patch 14 mg  14 mg Transdermal Daily Rai, Ripudeep K, MD   14 mg at 10/27/20 0804  . ondansetron (ZOFRAN) injection 4 mg  4 mg Intravenous Q6H PRN Hall, Carole N, DO      . QUEtiapine (SEROQUEL) tablet 400 mg  400 mg Oral q AM Estella Husk, MD   400 mg at 10/27/20 0804    Musculoskeletal: Strength & Muscle Tone: within normal limits Gait & Station: remains sitting on edge of bed Patient leans: N/A            Psychiatric Specialty Exam:  Presentation  General Appearance: Bizarre (patient has not shirt on and is rubbing his arm "looking for my veins")  Eye Contact:Poor  Speech:Clear and Coherent  Speech  Volume:Normal  Handedness:Right   Mood and Affect  Mood:Anxious  Affect:Restricted   Thought Process  Thought Processes:Linear  Descriptions of Associations:Circumstantial  Orientation:-- (Intact to person, but believes the year is 2002  but when corrected to 2022 he says he knows this)  Thought Content:Delusions (continues to believe he has moved group homes.)  History of Schizophrenia/Schizoaffective disorder:Yes  Duration of Psychotic Symptoms:Greater than six months  Hallucinations:Hallucinations: None  Ideas of Reference:Delusions  Suicidal Thoughts:Suicidal Thoughts: No  Homicidal Thoughts:Homicidal Thoughts: No   Sensorium  Memory:Immediate Poor; Recent Poor; Remote Poor  Judgment:Impaired  Insight:None   Executive Functions  Concentration:Poor  Attention Span:Poor  Recall:Poor  Fund of Knowledge:Poor  Language:Fair   Psychomotor Activity  Psychomotor Activity:Psychomotor Activity: Increased   Assets  Assets:Resilience; Housing   Sleep  Sleep:Sleep: Poor   Physical Exam: Physical Exam Constitutional:      Appearance: He is not ill-appearing.  HENT:     Head: Normocephalic and atraumatic.  Eyes:     Extraocular Movements: Extraocular movements intact.     Conjunctiva/sclera: Conjunctivae normal.  Cardiovascular:     Rate and Rhythm: Normal rate.  Pulmonary:     Effort: Pulmonary effort is normal.     Breath sounds: Normal breath sounds.  Abdominal:     General: Abdomen is flat.  Musculoskeletal:        General: Normal range of motion.  Skin:    General: Skin is dry.  Neurological:     General: No focal deficit present.     Mental Status: He is alert.    Review of Systems  Constitutional: Negative for chills and fever.  HENT: Negative for hearing loss.   Eyes: Negative for blurred vision.  Respiratory: Negative for cough and wheezing.   Cardiovascular: Negative for chest pain.  Gastrointestinal: Negative for abdominal  pain.  Neurological: Negative for dizziness.  Psychiatric/Behavioral: Negative for suicidal ideas.   Blood pressure (!) 154/96, pulse 89, temperature 97.6 F (36.4 C), temperature source Oral, resp. rate 18, SpO2 96 %. There is no height or weight on file to calculate BMI.  Treatment Plan Summary: Daily contact with patient to assess and evaluate symptoms and progress in treatment Schizophrenia   Recommendations  -Continue Seroquel 400 mg daily  - Recommend reaching back out to patient's group home for dispo planning -consider PRN for agitation if he continues to present with agitation - Recommend group home take patient to [facility] services after discharge.   Walk in hours are 8-11 AM Monday through Thursday for medication management.Child and adolescent psychiatrists are only available on Wednesdays and Thursdays during walk in hours.  Therapy walk in hours are Monday-Wednesday 8 AM-1PM.   It is first come, first -serve; it is best to arrive by 7:00 AM.   On Friday from 1 pm to 4 pm for therapy intake only. Please arrive by 12:00 pm as it is  first come, first -serve.    When you arrive please go upstairs for your appointment. If you are unsure of where to go, inform the front desk that you are here for a walk in appointment and they will assist you with directions upstairs.  Address:  [address] in [place], [address] Ph: [phone]   Communicated recommendations to Dr. Isidoro Donning via epic secure chat  Psych will reassess in the AM.  Disposition: Per primary team. Per psychiatry patient does not meet inpatient psych criteria.  PGY-1 Bobbye Morton, MD 10/27/2020 1:19 PM

## 2020-10-27 NOTE — Discharge Instructions (Signed)
Psychiatry   Walk in hours are 8-11 AM Monday through Thursday for medication management.Child and adolescent psychiatrists are only available on Wednesdays and Thursdays during walk in hours.  Therapy walk in hours are Monday-Wednesday 8 AM-1PM.   It is first come, first -serve; it is best to arrive by 7:00 AM.   On Friday from 1 pm to 4 pm for therapy intake only. Please arrive by 12:00 pm as it is  first come, first -serve.    When you arrive please go upstairs for your appointment. If you are unsure of where to go, inform the front desk that you are here for a walk in appointment and they will assist you with directions upstairs.  Address:  [address] in [place], [address] Ph: [phone]

## 2020-10-28 ENCOUNTER — Other Ambulatory Visit (HOSPITAL_COMMUNITY): Payer: Self-pay

## 2020-10-28 DIAGNOSIS — R4689 Other symptoms and signs involving appearance and behavior: Secondary | ICD-10-CM

## 2020-10-28 LAB — URINE CULTURE: Culture: <10000 — AB

## 2020-10-28 LAB — GLUCOSE, CAPILLARY: Glucose-Capillary: 154 mg/dL — ABNORMAL HIGH (ref 70–99)

## 2020-10-28 MED ORDER — AMLODIPINE BESYLATE 5 MG PO TABS
5.0000 mg | ORAL_TABLET | Freq: Every day | ORAL | 3 refills | Status: DC
  Filled 2020-10-28: qty 30, 30d supply, fill #0

## 2020-10-28 MED ORDER — TRAZODONE HCL 50 MG PO TABS
50.0000 mg | ORAL_TABLET | Freq: Every day | ORAL | 0 refills | Status: DC
  Filled 2020-10-28: qty 20, 20d supply, fill #0

## 2020-10-28 MED ORDER — TRAZODONE HCL 50 MG PO TABS
50.0000 mg | ORAL_TABLET | Freq: Every evening | ORAL | 0 refills | Status: DC | PRN
  Filled 2020-10-28: qty 20, 20d supply, fill #0

## 2020-10-28 MED ORDER — QUETIAPINE FUMARATE 200 MG PO TABS
400.0000 mg | ORAL_TABLET | Freq: Every morning | ORAL | 1 refills | Status: DC
  Filled 2020-10-28: qty 60, 30d supply, fill #0

## 2020-10-28 MED ORDER — DIVALPROEX SODIUM 250 MG PO DR TAB
250.0000 mg | DELAYED_RELEASE_TABLET | Freq: Two times a day (BID) | ORAL | 0 refills | Status: DC
  Filled 2020-10-28: qty 60, 30d supply, fill #0

## 2020-10-28 NOTE — Discharge Summary (Signed)
Physician Discharge Summary   Patient ID: Ronald Roman MRN: [id number] DOB/AGE: 1960-12-01 60 y.o.  Admit date: 10/22/2020 Discharge date: 10/28/2020  Primary Care Physician:  System, Provider Not In   Recommendations for Outpatient Follow-up:  1. Follow up with PCP in 1-2 weeks 2. Started on Seroquel 400 mg daily a.m.  Home Health: Returning back to group home Equipment/Devices:   Discharge Condition: stable  CODE STATUS: FULL  Diet recommendation: Regular diet   Discharge Diagnoses:    . AKI (acute kidney injury) (HCC) Dehydration with non-anion gap metabolic acidosis . Acute metabolic encephalopathy with psychosis and paranoid schizophrenia . Hypertension . Paranoid schizophrenia (HCC) Diabetes mellitus, type II Nicotine use  Consults: Psychiatry    Allergies:   Allergies  Allergen Reactions  . Cogentin [Benztropine] Other (See Comments)    Per MAR   . Penicillins Other (See Comments)    Per MAR - unable to verify patients PCN reaction   . Prolixin [Fluphenazine] Other (See Comments)    Per MAR      DISCHARGE MEDICATIONS: Allergies as of 10/28/2020      Reactions   Cogentin [benztropine] Other (See Comments)   Per MAR    Penicillins Other (See Comments)   Per MAR - unable to verify patients PCN reaction    Prolixin [fluphenazine] Other (See Comments)   Per MAR       Medication List    STOP taking these medications   amantadine 100 MG capsule Commonly known as: SYMMETREL   insulin glargine 100 UNIT/ML injection Commonly known as: LANTUS   lisinopril 20 MG tablet Commonly known as: ZESTRIL   zolpidem 5 MG tablet Commonly known as: AMBIEN     TAKE these medications   albuterol 108 (90 Base) MCG/ACT inhaler Commonly known as: VENTOLIN HFA Inhale 2 puffs into the lungs every 4 (four) hours as needed for wheezing or shortness of breath.   amLODipine 5 MG tablet Commonly known as: NORVASC Take 1 tablet (5 mg total) by mouth daily. Start  taking on: October 29, 2020   dapagliflozin propanediol 10 MG Tabs tablet Commonly known as: FARXIGA Take 10 mg by mouth daily after breakfast.   divalproex 250 MG DR tablet Commonly known as: DEPAKOTE Take 1 tablet (250 mg total) by mouth 2 (two) times daily. What changed:   when to take this  additional instructions  Another medication with the same name was removed. Continue taking this medication, and follow the directions you see here.   hydrOXYzine 25 MG tablet Commonly known as: ATARAX/VISTARIL Take 25 mg by mouth 4 (four) times daily.   magnesium oxide 400 (240 Mg) MG tablet Commonly known as: MAG-OX Take 1 tablet (400 mg total) by mouth daily.   metFORMIN 1000 MG tablet Commonly known as: GLUCOPHAGE Take 1,000 mg by mouth 2 (two) times daily with a meal.   QUEtiapine 200 MG tablet Commonly known as: SEROQUEL Take 2 tablets (400 mg total) by mouth in the morning. Start taking on: October 29, 2020 What changed:   how much to take  when to take this   traZODone 50 MG tablet Commonly known as: DESYREL Take 1 tablet (50 mg total) by mouth at bedtime. What changed: how much to take        Brief H and P: For complete details please refer to admission H and P, but in brief *Patient is a 60 year old male with history of schizophrenia, hypertension, diabetes mellitus presented to ED after being picked up on  the street by [facility] police due to abnormal behavior.  Patient was found to be missing from his group home for 2 days.  Per EDP, there were homicidal threats made and patient had erratic behaviors.  Patient was likely not taking his medications. In ED, patient was found to have abnormal labs, creatinine 3.5, BUN 66, serum bicarb 18, gap 13.  Patient was admitted for AKI with acute metabolic encephalopathy/psychosis    Hospital Course:  AKI (acute kidney injury) (HCC) in the setting of dehydration, NAG metabolic acidosis -Creatinine was 1.1 on  10/14/2020 -Presented with creatinine of 3.5, BUN 66, CO2 18 -Patient was placed on IV fluid hydration, he took his IV out on 6/2, has been tolerating p.o. diet -Creatinine has continued to improve, 1.3 on 6/4    Diabetes mellitus without complication (HCC), type II, IDDM -Hemoglobin A1c 5.2 on 10/11/2020.   -CBGs remained stable, patient was placed on sliding scale insulin while inpatient, did not require any long-acting insulin.   Acute metabolic encephalopathy/psychosis/paranoid schizophrenia - Patient had not been taking his medications for at least 2 days prior to admission -Psychiatry was consulted, followed closely.  Psychiatry recommended Seroquel 400 mg daily a.m. as patient has been intermittently refusing twice daily dosings.  -Psychiatry recommended continue Depakote 250 mg twice a day, Atarax, trazodone.      Hypertension -Lisinopril was held due to acute kidney injury -BP readings has been elevated, placed on Norvasc 5 mg daily  Nicotine use -Placed on nicotine patch  Low-grade fever - Temp 100.9 F on 6/5, no systemic signs, no chest pain, shortness of breath or diarrhea. -UA negative for UTI -Chest x-ray clear, no pneumonia, afebrile since then     Day of Discharge S: No behavioral issues, sitting at the edge of the bed.  Follows all commands.  Cooperative.  BP 121/80 (BP Location: Left Arm)   Pulse 100   Temp 98.6 F (37 C) (Oral)   Resp 18   SpO2 100%   Physical Exam: General: Alert and awake oriented, NAD CVS: S1-S2 clear no murmur rubs or gallops Chest: clear to auscultation bilaterally, no wheezing rales or rhonchi Abdomen: soft nontender, nondistended, normal bowel sounds Extremities: no cyanosis, clubbing or edema noted bilaterally Neuro: ambulating without any difficulty    Get Medicines reviewed and adjusted: Please take all your medications with you for your next visit with your Primary MD  Please request your Primary MD to go over  all hospital tests and procedure/radiological results at the follow up. Please ask your Primary MD to get all Hospital records sent to his/her office.  If you experience worsening of your admission symptoms, develop shortness of breath, life threatening emergency, suicidal or homicidal thoughts you must seek medical attention immediately by calling 911 or calling your MD immediately  if symptoms less severe.  You must read complete instructions/literature along with all the possible adverse reactions/side effects for all the Medicines you take and that have been prescribed to you. Take any new Medicines after you have completely understood and accept all the possible adverse reactions/side effects.   Do not drive when taking pain medications.   Do not take more than prescribed Pain, Sleep and Anxiety Medications  Special Instructions: If you have smoked or chewed Tobacco  in the last 2 yrs please stop smoking, stop any regular Alcohol  and or any Recreational drug use.  Wear Seat belts while driving.  Please note  You were cared for by a hospitalist during your hospital  stay. Once you are discharged, your primary care physician will handle any further medical issues. Please note that NO REFILLS for any discharge medications will be authorized once you are discharged, as it is imperative that you return to your primary care physician (or establish a relationship with a primary care physician if you do not have one) for your aftercare needs so that they can reassess your need for medications and monitor your lab values.   The results of significant diagnostics from this hospitalization (including imaging, microbiology, ancillary and laboratory) are listed below for reference.      Procedures/Studies:  CT HEAD WO CONTRAST  Result Date: 10/11/2020 CLINICAL DATA:  Mental status change. EXAM: CT HEAD WITHOUT CONTRAST TECHNIQUE: Contiguous axial images were obtained from the base of the skull  through the vertex without intravenous contrast. COMPARISON:  Oct 05, 2020 FINDINGS: Brain: No evidence of acute infarction, hemorrhage, hydrocephalus, extra-axial collection or mass lesion/mass effect. Vascular: Calcified atherosclerosis in the intracranial carotids. Skull: Normal. Negative for fracture or focal lesion. Sinuses/Orbits: No acute finding. Other: None. IMPRESSION: No acute abnormalities identified. Electronically Signed   By: Gerome Sam III M.D   On: 10/11/2020 14:16   CT Head Wo Contrast  Result Date: 10/05/2020 CLINICAL DATA:  Seizure EXAM: CT HEAD WITHOUT CONTRAST TECHNIQUE: Contiguous axial images were obtained from the base of the skull through the vertex without intravenous contrast. COMPARISON:  June 22, 2020 FINDINGS: Brain: There is slight diffuse atrophy, stable. There is no intracranial mass, hemorrhage, extra-axial fluid collection, or midline shift. Scattered foci of decreased attenuation in the centra semiovale bilaterally are stable. No acute infarct evident. Vascular: No hyperdense vessels. Foci of calcification noted in each carotid siphon. Skull: Bony calvarium appears intact. Sinuses/Orbits: Visualized paranasal sinuses are clear. Orbits appear symmetric bilaterally. Other: Visualized mastoid air cells are clear. Stable foci of apparent scarring in the scalp region appear stable. IMPRESSION: Atrophy with mild periventricular small vessel disease. No acute infarct. No mass or hemorrhage. There are foci of arterial vascular calcification. Electronically Signed   By: Bretta Bang III M.D.   On: 10/05/2020 15:55   US Renal  Result Date: 10/11/2020 CLINICAL DATA:  Renal failure EXAM: RENAL / URINARY TRACT ULTRASOUND COMPLETE COMPARISON:  None. FINDINGS: Right Kidney: Renal measurements: 10.0 x 5.1 x 4.8 cm = volume: 128 mL. Echogenicity within normal limits. No mass or hydronephrosis visualized. Left Kidney: Renal measurements: 10.2 x 6.2 x 4.2 cm = volume: 138 mL. 8  normal echotexture. No hydronephrosis. 3.3 cm midpole cyst. Bladder: Appears normal for degree of bladder distention. Other: None. IMPRESSION: No acute findings.  No hydronephrosis. Electronically Signed   By: Charlett Nose M.D.   On: 10/11/2020 16:01   DG CHEST PORT 1 VIEW  Result Date: 10/26/2020 CLINICAL DATA:  Fever EXAM: PORTABLE CHEST 1 VIEW COMPARISON:  Oct 11, 2020 FINDINGS: The lungs are clear. Heart size and pulmonary vascularity are normal. No adenopathy. No bone lesions. IMPRESSION: Lungs clear.  Cardiac silhouette within normal limits. Electronically Signed   By: Bretta Bang III M.D.   On: 10/26/2020 14:18   DG Chest Port 1 View  Result Date: 10/11/2020 CLINICAL DATA:  Unresponsive. EXAM: PORTABLE CHEST 1 VIEW COMPARISON:  None. FINDINGS: The cardiomediastinal silhouette is unremarkable. Increased MEDIAL RIGHT LOWER lung opacity noted. No consolidation, pleural effusion, pneumothorax or acute bony abnormality noted. IMPRESSION: Increased MEDIAL RIGHT LOWER lung opacity which may represent atelectasis or airspace disease/pneumonia. Electronically Signed   By: Henrietta Hoover.D.  On: 10/11/2020 14:27      LAB RESULTS: Basic Metabolic Panel: Recent Labs  Lab 10/23/20 0406 10/24/20 0721 10/25/20 0920  NA  --  140 138  K  --  4.3 4.4  CL  --  107 106  CO2  --  24 22  GLUCOSE  --  102* 131*  BUN  --  46* 33*  CREATININE  --  1.75* 1.31*  CALCIUM  --  9.2 9.3  MG 1.9  --   --   PHOS 4.9*  --   --    Liver Function Tests: Recent Labs  Lab 10/22/20 2232 10/25/20 0920  AST 46* 40  ALT 19 31  ALKPHOS 65 74  BILITOT 1.0 0.5  PROT 6.2* 6.2*  ALBUMIN 3.5 3.6   No results for input(s): LIPASE, AMYLASE in the last 168 hours. No results for input(s): AMMONIA in the last 168 hours. CBC: Recent Labs  Lab 10/23/20 0220 10/25/20 0920  WBC 12.8* 11.2*  HGB 11.6* 12.7*  HCT 35.9* 38.7*  MCV 84.7 82.9  PLT 262 288   Cardiac Enzymes: No results for input(s): CKTOTAL,  CKMB, CKMBINDEX, TROPONINI in the last 168 hours. BNP: Invalid input(s): POCBNP CBG: Recent Labs  Lab 10/27/20 1939 10/28/20 1112  GLUCAP 158* 154*       Disposition and Follow-up: Discharge Instructions    Diet Carb Modified   Complete by: As directed    Discharge instructions   Complete by: As directed    - [facility].   Increase activity slowly   Complete by: As directed        DISPOSITION: Group home  DISCHARGE FOLLOW-UP:  Recommend [facility].    Follow-up Information    [facility] in [facility] week(s).   Specialty: Behavioral Health Why: Walk-in hours 8-11 AM Monday to Thursday. Therapy walk in hours Monday to Wednesday 8 AM to 1 PM.  On Friday 1 PM to 4 PM for therapy intake only. Contact information: [address] [place] [id number] [phone]             Walk in hours are 8-11 AM Monday through Thursday for medication management.Child and adolescent psychiatrists are only available on Wednesdays and Thursdays during walk in hours.  Therapy walk in hours are Monday-Wednesday 8 AM-1PM.   It is first come, first -serve; it is best to arrive by 7:00 AM.   On Friday from 1 pm to 4 pm for therapy intake only. Please arrive by 12:00 pm as it is  first come, first -serve.    When you arrive please go upstairs for your appointment. If you are unsure of where to go, inform the front desk that you are here for a walk in appointment and they will assist you with directions upstairs.  Address:  [address], [place], [id number] Ph: [phone]    Time coordinating discharge:  35 minutes  Signed:   Thad Rangeripudeep Junie Engram M.D. [facility] 10/28/2020, 1:01 PM

## 2020-10-28 NOTE — Consult Note (Addendum)
[facility] Face-to-Face Psychiatry Consult   Reason for Consult:  Uncontrolled Schiozphrenia Referring Physician:  Dow Adolph, DO Patient Identification: Ronald Roman MRN:  [id number] Principal Diagnosis: AKI (acute kidney injury) (HCC) Diagnosis:  Principal Problem:   AKI (acute kidney injury) (HCC) Active Problems:   Diabetes mellitus without complication (HCC)   Hypertension   Paranoid schizophrenia (HCC)   Acute metabolic encephalopathy   Total Time spent with patient: 15 minutes  Subjective:   Ronald Roman is a 60 y.o. male patient admitted with AKI after being reported missing for 2 days.Patient presented via [facility] after they found him. Patient has a PPH of schizophrenia.  HPI:   Overnight patient had no behavior issues. Patient continues to eat well. Patient remains compliant with his medication. Patient denies SI, HI, and AVH. Patient appears at his psychiatric baseline. Past Psychiatric History:Schizophrenia  Risk to Self:NO Risk to Others:NO Prior Inpatient Therapy:YES, [facility] 2017, [facility] 5/30 and 10/21/2020 Prior Outpatient Therapy:Yes in group home setting, recommended patient initiate new OP therapy at [facility] Past Medical History:  Past Medical History:  Diagnosis Date  . Diabetes mellitus without complication (HCC)   . GERD (gastroesophageal reflux disease)   . Hyperlipidemia   . Hypertension   . Paranoid schizophrenia (HCC)   . Sleep apnea   . Uric acid nephrolithiasis    No past surgical history on file. Family History: No family history on file. Family Psychiatric  History: Unknown Social History:  Social History   Substance and Sexual Activity  Alcohol Use No     Social History   Substance and Sexual Activity  Drug Use No    Social History   Socioeconomic History  . Marital status: Single    Spouse name: Not on file  . Number of children: Not on file  . Years of education: Not on file  . Highest education level: Not on file   Occupational History  . Not on file  Tobacco Use  . Smoking status: Current Every Day Smoker    Packs/day: 1.00    Types: Cigarettes  . Smokeless tobacco: Never Used  Substance and Sexual Activity  . Alcohol use: No  . Drug use: No  . Sexual activity: Not Currently  Other Topics Concern  . Not on file  Social History Narrative  . Not on file   Social Determinants of Health   Financial Resource Strain: Not on file  Food Insecurity: Not on file  Transportation Needs: Not on file  Physical Activity: Not on file  Stress: Not on file  Social Connections: Not on file   Additional Social History:    Allergies:   Allergies  Allergen Reactions  . Cogentin [Benztropine] Other (See Comments)    Per MAR   . Penicillins Other (See Comments)    Per MAR - unable to verify patients PCN reaction   . Prolixin [Fluphenazine] Other (See Comments)    Per MAR     Labs:  Results for orders placed or performed during the hospital encounter of 10/22/20 (from the past 48 hour(s))  Glucose, capillary     Status: Abnormal   Collection Time: 10/26/20 11:36 AM  Result Value Ref Range   Glucose-Capillary 124 (H) 70 - 99 mg/dL    Comment: Glucose reference range applies only to samples taken after fasting for at least 8 hours.  Procalcitonin - Baseline     Status: None   Collection Time: 10/26/20  1:11 PM  Result Value Ref Range   Procalcitonin  0.14 ng/mL    Comment:        Interpretation: PCT (Procalcitonin) <= 0.5 ng/mL: Systemic infection (sepsis) is not likely. Local bacterial infection is possible. (NOTE)       Sepsis PCT Algorithm           Lower Respiratory Tract                                      Infection PCT Algorithm    ----------------------------     ----------------------------         PCT < 0.25 ng/mL                PCT < 0.10 ng/mL          Strongly encourage             Strongly discourage   discontinuation of antibiotics    initiation of antibiotics     ----------------------------     -----------------------------       PCT 0.25 - 0.50 ng/mL            PCT 0.10 - 0.25 ng/mL               OR       >80% decrease in PCT            Discourage initiation of                                            antibiotics      Encourage discontinuation           of antibiotics    ----------------------------     -----------------------------         PCT >= 0.50 ng/mL              PCT 0.26 - 0.50 ng/mL               AND        <80% decrease in PCT             Encourage initiation of                                             antibiotics       Encourage continuation           of antibiotics    ----------------------------     -----------------------------        PCT >= 0.50 ng/mL                  PCT > 0.50 ng/mL               AND         increase in PCT                  Strongly encourage                                      initiation of antibiotics    Strongly encourage escalation           of antibiotics                                     -----------------------------  PCT <= 0.25 ng/mL                                                 OR                                        > 80% decrease in PCT                                      Discontinue / Do not initiate                                             antibiotics  Performed at [facility], [address], [place], [name] [address]   Glucose, capillary     Status: Abnormal   Collection Time: 10/26/20  4:28 PM  Result Value Ref Range   Glucose-Capillary 120 (H) 70 - 99 mg/dL    Comment: Glucose reference range applies only to samples taken after fasting for at least 8 hours.  Glucose, capillary     Status: Abnormal   Collection Time: 10/26/20  9:06 PM  Result Value Ref Range   Glucose-Capillary 172 (H) 70 - 99 mg/dL    Comment: Glucose reference range applies only to samples taken after fasting for at least 8 hours.  Urinalysis,  Routine w reflex microscopic Urine, Clean Catch     Status: Abnormal   Collection Time: 10/27/20  6:32 AM  Result Value Ref Range   Color, Urine YELLOW YELLOW   APPearance CLEAR CLEAR   Specific Gravity, Urine 1.010 1.005 - 1.030   pH 5.0 5.0 - 8.0   Glucose, UA >=500 (A) NEGATIVE mg/dL   Hgb urine dipstick NEGATIVE NEGATIVE   Bilirubin Urine NEGATIVE NEGATIVE   Ketones, ur NEGATIVE NEGATIVE mg/dL   Protein, ur NEGATIVE NEGATIVE mg/dL   Nitrite NEGATIVE NEGATIVE   Leukocytes,Ua NEGATIVE NEGATIVE   RBC / HPF 0-5 0 - 5 RBC/hpf   WBC, UA 0-5 0 - 5 WBC/hpf   Bacteria, UA RARE (A) NONE SEEN   Mucus PRESENT     Comment: Performed at [facility], [address], [place], [name] [address]  Glucose, capillary     Status: Abnormal   Collection Time: 10/27/20  6:54 AM  Result Value Ref Range   Glucose-Capillary 113 (H) 70 - 99 mg/dL    Comment: Glucose reference range applies only to samples taken after fasting for at least 8 hours.  Glucose, capillary     Status: Abnormal   Collection Time: 10/27/20 11:17 AM  Result Value Ref Range   Glucose-Capillary 123 (H) 70 - 99 mg/dL    Comment: Glucose reference range applies only to samples taken after fasting for at least 8 hours.  Glucose, capillary     Status: Abnormal   Collection Time: 10/27/20  4:10 PM  Result Value Ref Range   Glucose-Capillary 171 (H) 70 - 99 mg/dL    Comment: Glucose reference range applies only to samples taken after fasting for at least 8 hours.  Glucose,  capillary     Status: Abnormal   Collection Time: 10/27/20  7:39 PM  Result Value Ref Range   Glucose-Capillary 158 (H) 70 - 99 mg/dL    Comment: Glucose reference range applies only to samples taken after fasting for at least 8 hours.    Current Facility-Administered Medications  Medication Dose Route Frequency Provider Last Rate Last Admin  . acetaminophen (TYLENOL) tablet 500 mg  500 mg Oral Q6H PRN Dow Adolph N, DO      . amLODipine (NORVASC) tablet  5 mg  5 mg Oral Daily Rai, Ripudeep K, MD   5 mg at 10/28/20 0920  . enoxaparin (LOVENOX) injection 30 mg  30 mg Subcutaneous Q24H Hall, Carole N, DO   30 mg at 10/28/20 1610  . haloperidol lactate (HALDOL) injection 5 mg  5 mg Intramuscular Q6H PRN Marikay Alar, FNP   5 mg at 10/26/20 2103  . insulin aspart (novoLOG) injection 0-5 Units  0-5 Units Subcutaneous QHS Rai, Ripudeep K, MD      . insulin aspart (novoLOG) injection 0-9 Units  0-9 Units Subcutaneous TID WC Rai, Ripudeep K, MD   2 Units at 10/28/20 0921  . melatonin tablet 3 mg  3 mg Oral QHS PRN Dow Adolph N, DO      . nicotine (NICODERM CQ - dosed in mg/24 hours) patch 14 mg  14 mg Transdermal Daily Rai, Ripudeep K, MD   14 mg at 10/28/20 0921  . ondansetron (ZOFRAN) injection 4 mg  4 mg Intravenous Q6H PRN Hall, Carole N, DO      . QUEtiapine (SEROQUEL) tablet 400 mg  400 mg Oral q AM Estella Husk, MD   400 mg at 10/28/20 9604    Musculoskeletal: Strength & Muscle Tone: within normal limits Gait & Station: patient prefers to stay in bed Patient leans: N/A            Psychiatric Specialty Exam:  Presentation  General Appearance: Appropriate for Environment  Eye Contact:None  Speech:Slurred  Speech Volume:Normal  Handedness:Right   Mood and Affect  Mood:Euthymic  Affect:Flat   Thought Process  Thought Processes:Linear  Descriptions of Associations:Tangential  Orientation:Partial  Thought Content:Perseveration  History of Schizophrenia/Schizoaffective disorder:Yes  Duration of Psychotic Symptoms:Greater than six months  Hallucinations:Hallucinations: None  Ideas of Reference:Delusions  Suicidal Thoughts:Suicidal Thoughts: No  Homicidal Thoughts:Homicidal Thoughts: No   Sensorium  Memory:Immediate Poor; Recent Poor; Remote Poor  Judgment:Impaired  Insight:None   Executive Functions  Concentration:Poor  Attention Span:Poor  Recall:Poor  Fund of  Knowledge:Poor  Language:Fair   Psychomotor Activity  Psychomotor Activity:Psychomotor Activity: Normal   Assets  Assets:Housing; Resilience   Sleep  Sleep:Sleep: Fair   Physical Exam: Physical Exam Constitutional:      Appearance: Normal appearance.  HENT:     Head: Normocephalic and atraumatic.  Eyes:     Extraocular Movements: Extraocular movements intact.     Conjunctiva/sclera: Conjunctivae normal.  Cardiovascular:     Rate and Rhythm: Normal rate.  Pulmonary:     Effort: Pulmonary effort is normal.     Breath sounds: Normal breath sounds.  Abdominal:     General: Abdomen is flat.  Musculoskeletal:        General: Normal range of motion.  Skin:    General: Skin is warm and dry.  Neurological:     General: No focal deficit present.     Mental Status: He is alert.    Review of Systems  Constitutional: Negative for chills and  fever.  HENT: Negative for hearing loss.   Eyes: Negative for blurred vision.  Respiratory: Negative for cough and wheezing.   Cardiovascular: Negative for chest pain.  Gastrointestinal: Negative for abdominal pain.  Neurological: Negative for dizziness.  Psychiatric/Behavioral: Negative for hallucinations and suicidal ideas.   Blood pressure 121/80, pulse 100, temperature 98.6 F (37 C), temperature source Oral, resp. rate 18, SpO2 100 %. There is no height or weight on file to calculate BMI.  Treatment Plan Summary: Patient is psychiatrically stable. Schizophrenia   Recommendations  -Continue Seroquel 400 mg daily  - Recommend reaching back out to patient's group home for dispo planning - Patient's Depakote can be restarted at 250mg  BID  - Home trazodone and Atrax can be continued - [facility].  Disposition: Per priamry team. Per pscyh team patient does not meet inpatient criteria.  PGY-1 Bobbye MortonJai B Icel Castles, MD 10/28/2020 10:33 AM

## 2020-10-28 NOTE — TOC Transition Note (Signed)
Transition of Care [facility]) - CM/SW Discharge Note   Patient Details  Name: Ronald Roman MRN: [id number] Date of Birth: [birth year]  [facility] CM/SW Contact:  Ralene Bathe, LCSWA Phone Number: 10/28/2020, 2:18 PM   Clinical Narrative:    Patient will DC to: group home Anticipated DC date: 10/28/2020 Transport by: [facility]   Per MD patient ready for DC back to [facility].  RN, patient, facility notified of DC.  CSW spoke with Synetta Fail at the group home and verified that the patient can now return.  The group home did not have anyone available to transport.  CSW contacted [facility] to return the patient to his group home.  The patient's medications were filled at the [facility] pharmacy.   CSW will sign off for now as social work intervention is no longer needed. Please consult [place] again if new needs arise.     Final next level of care: Group Home Barriers to Discharge: No Barriers Identified   Patient Goals and CMS Choice        Discharge Placement                Patient to be transferred to facility by: [facility] Name of family member notified: Synetta Fail with [facility] Patient and family notified of of transfer: 10/28/20  Discharge Plan and Services                                     Social Determinants of Health (SDOH) Interventions     Readmission Risk Interventions No flowsheet data found.

## 2020-10-28 NOTE — TOC Progression Note (Signed)
Transition of Care [facility]) - Progression Note    Patient Details  Name: GARRETT MITCHUM MRN: [id number] Date of Birth: 1960/09/14  [facility] Care [facility]) CM/SW Contact  Ralene Bathe, LCSWA Phone Number: 10/28/2020, 1:18 PM  Clinical Narrative:    CSW spoke with Synetta Fail at [facility].  Synetta Fail was informed of the patient's possible d/c today.  CSW was informed that the group home would need information about the patient's medications and how his behaviors are being managed prior to readmission to the group home.    CSW informed the Psychiatry MD and attending of the group home's request.        Expected Discharge Plan and Services           Expected Discharge Date: 10/28/20                                     Social Determinants of Health (SDOH) Interventions    Readmission Risk Interventions No flowsheet data found.

## 2020-11-13 ENCOUNTER — Other Ambulatory Visit: Payer: Self-pay

## 2020-11-13 ENCOUNTER — Ambulatory Visit (HOSPITAL_COMMUNITY)
Admission: EM | Admit: 2020-11-13 | Discharge: 2020-11-14 | Disposition: A | Discharge status: Home / Self Care | Payer: Medicaid Other | Attending: Nurse Practitioner | Admitting: Nurse Practitioner

## 2020-11-13 DIAGNOSIS — F17210 Nicotine dependence, cigarettes, uncomplicated: Secondary | ICD-10-CM | POA: Insufficient documentation

## 2020-11-13 DIAGNOSIS — Z794 Long term (current) use of insulin: Secondary | ICD-10-CM | POA: Insufficient documentation

## 2020-11-13 DIAGNOSIS — F200 Paranoid schizophrenia: Secondary | ICD-10-CM | POA: Insufficient documentation

## 2020-11-13 DIAGNOSIS — Z046 Encounter for general psychiatric examination, requested by authority: Secondary | ICD-10-CM

## 2020-11-13 DIAGNOSIS — Z20822 Contact with and (suspected) exposure to covid-19: Secondary | ICD-10-CM | POA: Insufficient documentation

## 2020-11-13 DIAGNOSIS — R451 Restlessness and agitation: Secondary | ICD-10-CM | POA: Insufficient documentation

## 2020-11-13 DIAGNOSIS — Z79899 Other long term (current) drug therapy: Secondary | ICD-10-CM | POA: Insufficient documentation

## 2020-11-13 LAB — POC SARS CORONAVIRUS 2 AG -  ED: SARS Coronavirus 2 Ag: NEGATIVE

## 2020-11-13 LAB — POCT URINE DRUG SCREEN - MANUAL ENTRY (I-SCREEN)
POC Amphetamine UR: NOT DETECTED
POC Buprenorphine (BUP): NOT DETECTED
POC Cocaine UR: NOT DETECTED
POC Marijuana UR: NOT DETECTED
POC Methadone UR: NOT DETECTED
POC Methamphetamine UR: NOT DETECTED
POC Morphine: NOT DETECTED
POC Oxazepam (BZO): NOT DETECTED
POC Oxycodone UR: NOT DETECTED
POC Secobarbital (BAR): NOT DETECTED

## 2020-11-13 MED ORDER — ACETAMINOPHEN 325 MG PO TABS: 650.0000 mg | ORAL_TABLET | Freq: Four times a day (QID) | ORAL | Status: DC | PRN

## 2020-11-13 MED ORDER — MAGNESIUM OXIDE -MG SUPPLEMENT 400 (240 MG) MG PO TABS
400.0000 mg | ORAL_TABLET | Freq: Every day | ORAL | Status: DC
  Filled 2020-11-13 (×2): qty 1

## 2020-11-13 MED ORDER — LORAZEPAM 2 MG/ML IJ SOLN
2.0000 mg | Freq: Once | INTRAMUSCULAR | Status: AC
  Administered 2020-11-13: 2 mg via INTRAMUSCULAR
  Filled 2020-11-13: qty 1

## 2020-11-13 MED ORDER — METFORMIN HCL 500 MG PO TABS
1000.0000 mg | ORAL_TABLET | Freq: Two times a day (BID) | ORAL | Status: DC
  Administered 2020-11-14: 1000 mg via ORAL
  Filled 2020-11-13: qty 2

## 2020-11-13 MED ORDER — LORAZEPAM 1 MG PO TABS: 2.0000 mg | ORAL_TABLET | Freq: Once | ORAL | Status: AC

## 2020-11-13 MED ORDER — QUETIAPINE FUMARATE 200 MG PO TABS
200.0000 mg | ORAL_TABLET | Freq: Once | ORAL | Status: AC
  Administered 2020-11-13: 200 mg via ORAL
  Filled 2020-11-13: qty 1

## 2020-11-13 MED ORDER — MAGNESIUM HYDROXIDE 400 MG/5ML PO SUSP: 30.0000 mL | Freq: Every day | ORAL | Status: DC | PRN

## 2020-11-13 MED ORDER — DAPAGLIFLOZIN PROPANEDIOL 10 MG PO TABS
10.0000 mg | ORAL_TABLET | Freq: Every day | ORAL | Status: DC
  Filled 2020-11-13 (×2): qty 1

## 2020-11-13 MED ORDER — QUETIAPINE FUMARATE 200 MG PO TABS
400.0000 mg | ORAL_TABLET | Freq: Every morning | ORAL | Status: DC
  Administered 2020-11-14: 400 mg via ORAL
  Filled 2020-11-13: qty 2

## 2020-11-13 MED ORDER — TRAZODONE HCL 50 MG PO TABS
50.0000 mg | ORAL_TABLET | Freq: Every day | ORAL | Status: DC
  Administered 2020-11-13: 50 mg via ORAL
  Filled 2020-11-13: qty 1

## 2020-11-13 MED ORDER — HYDROXYZINE HCL 25 MG PO TABS
25.0000 mg | ORAL_TABLET | Freq: Four times a day (QID) | ORAL | Status: DC
  Administered 2020-11-13 – 2020-11-14 (×2): 25 mg via ORAL
  Filled 2020-11-13 (×2): qty 1

## 2020-11-13 MED ORDER — DIVALPROEX SODIUM 250 MG PO DR TAB
250.0000 mg | DELAYED_RELEASE_TABLET | Freq: Two times a day (BID) | ORAL | Status: DC
  Administered 2020-11-13 – 2020-11-14 (×2): 250 mg via ORAL
  Filled 2020-11-13 (×2): qty 1

## 2020-11-13 MED ORDER — AMLODIPINE BESYLATE 5 MG PO TABS
5.0000 mg | ORAL_TABLET | Freq: Every day | ORAL | Status: DC
  Administered 2020-11-14: 5 mg via ORAL
  Filled 2020-11-13: qty 1

## 2020-11-13 MED ORDER — ALUM & MAG HYDROXIDE-SIMETH 200-200-20 MG/5ML PO SUSP: 30.0000 mL | ORAL | Status: DC | PRN

## 2020-11-13 NOTE — ED Notes (Signed)
Medicated for moderate agitation standing at unit door attempting to elope provider Allyson Sabal NP notified and order for ativan 2 mg obtained.

## 2020-11-13 NOTE — ED Notes (Signed)
Pharmacy contacted for missing dose Mag-ox

## 2020-11-13 NOTE — ED Provider Notes (Signed)
Behavioral Health Admission H&P [facility] & OBS)  Date: 11/13/20 Patient Name: Ronald Roman MRN: [id number] Chief Complaint:  Chief Complaint  Patient presents with   Aggressive Behavior      Diagnoses:  Final diagnoses:  Schizophrenia, paranoid (HCC)  Involuntary commitment    HPI: Ronald Roman is a 60 y.o. male with a history of paranoid schizophrenia who presents  to [facility] under involuntary commitment.  Patient was petitioned for IVC by his caregiver, Graceann Congress [phone].   Patient's caregiver was contcted by TTS and this provider. Ronald Roman reports that the patient is a resident of [facility]. He states that the patient has lived at the group home since 10/14/2016. He states that the patient has been unstable since Clozapine was stopped approximately 3 weeks ago.  He states that that the patient's medication prescriber is no longer able to prescribe Clozapine.  He states that the patient's behavior is unpredictable.  He states 1 minute he is very nice and the next minute he is threatening to throw chairs at people.  He states that the police are contacted twice yesterday once due to the patient's aggressive behavior and the second time due to the patient standing in the middle of an intersection and refusing to move.  He states that the patient is currently taking amantadine 100 mg twice daily, Depakote 125 mg daily and 250 mg twice daily, Farxiga 10 mg daily, hydroxyzine 25 mg 4 times daily, Lantus 20 units at at bedtime, lisinopril 20 mg daily, mag oxide 400 mg daily, metformin 1000 mg twice daily, trazodone 25 mg nightly, zolpidem 5 mg nightly, and quetiapine 100 mg at bedtime 50 mg at noon and 100 mg in the morning.On chart review it is noted that the patient was discharged from [facility] on on June 7 after a 5-day admission due to AKI and acute metabolic encephalopathy.  He was discharged on reported to me by his caregiver is not 400 mg 400 mg daily, Depakote  DR 250 mg twice daily, hydroxyzine 25 mg 4 times daily, magnesium oxide 400 mg daily, metformin 1000 mg twice daily, and trazodone 50 mg nightly.  The patient's caregiver reports that the patient is taking a total of 250 mg daily of quetiapine as mentioned above.  Patient was discharged on quetiapine 400 mg daily, this could potentially explain his continued unstable mood.   Group home administrator Synetta Fail Wallis Bamberg [phone]) was contacted once verbal permission was obtained from patient.  She confirmed that patient is his own guardian.  She said also that she cannot take him back unless he is stable.  She is frustrated about not having a provider that can prescribe the clozepine.  She said that he does have the appointment on June 28 at [facility] but that it is too long a time to wait in between.    On evaluation, the patient is alert.  He is oriented to person and place.  He was unable to provide the day or date.  Patient's speech is garbled at times.  Patient reports that he is here because he thinks his group home worker called the police.  Patient denies any aggressive behavior.  He denies suicidal ideations.  He denies homicidal ideations.  He denies auditory hallucinations.  When asked about visual hallucinations, he states " those 2 cobras are real."  He states that he sees cobras at the group home.  He mildly perseverates on seeing cobras at the group home.  Patient states  he has difficulty sleeping at night because the other residents wake him up fighting.  When asked about a legal guardian or payee, patient states "I have money in 15 banks."  He eventually denies having a guardian or payee.  He denies use of alcohol, marijuana, and other substances.  States that he smokes cigarettes.  Patient was inpatient at [facility] in 2017 and was evaluated at [facility] on 10/20/2020 and 10/21/2020.   PHQ 2-9:   Flowsheet Row ED from 11/13/2020 in [facility] ED to Hosp-Admission (Discharged) from  10/22/2020 in [facility] ED to Hosp-Admission (Discharged) from 10/11/2020 in [facility]  C-SSRS RISK CATEGORY No Risk No Risk No Risk        Total Time spent with patient: 30 minutes  Musculoskeletal  Strength & Muscle Tone: within normal limits Gait & Station: normal Patient leans: N/A  Psychiatric Specialty Exam  Presentation General Appearance: Appropriate for Environment; Fairly Groomed  Eye Contact:Fair  Speech:Garbled  Speech Volume:Normal  Handedness:Right   Mood and Affect  Mood:Euthymic  Affect:Flat   Thought Process  Thought Processes:Linear  Descriptions of Associations:Intact  Orientation:Partial  Thought Content:Perseveration  Diagnosis of Schizophrenia or Schizoaffective disorder in past: Yes  Duration of Psychotic Symptoms: Greater than six months  Hallucinations:Hallucinations: Visual Description of Visual Hallucinations: reports seeing 2 cobras  Ideas of Reference:Delusions  Suicidal Thoughts:Suicidal Thoughts: No  Homicidal Thoughts:Homicidal Thoughts: No   Sensorium  Memory:Immediate Fair; Recent Poor; Remote Poor  Judgment:Impaired  Insight:Lacking   Executive Functions  Concentration:Poor  Attention Span:Poor  Recall:Poor  Fund of Knowledge:Poor  Language:Fair   Psychomotor Activity  Psychomotor Activity:Psychomotor Activity: Normal   Assets  Assets:Financial Resources/Insurance; Housing; Resilience   Sleep  Sleep:Sleep: Fair   Nutritional Assessment (For OBS and FBC admissions only) Has the patient had a weight loss or gain of 10 pounds or more in the last 3 months?: No Has the patient had a decrease in food intake/or appetite?: No Does the patient have dental problems?: No Does the patient have eating habits or behaviors that may be indicators of an eating disorder including binging or inducing vomiting?: No Has the patient recently lost weight  without trying?: No Has the patient been eating poorly because of a decreased appetite?: No Malnutrition Screening Tool Score: 0    Physical Exam Constitutional:      General: He is not in acute distress.    Appearance: He is not ill-appearing, toxic-appearing or diaphoretic.  HENT:     Head: Normocephalic.     Right Ear: External ear normal.     Left Ear: External ear normal.  Eyes:     Pupils: Pupils are equal, round, and reactive to light.  Cardiovascular:     Rate and Rhythm: Normal rate.  Pulmonary:     Effort: Pulmonary effort is normal. No respiratory distress.  Musculoskeletal:        General: Normal range of motion.  Neurological:     Mental Status: He is alert and oriented to person, place, and time.  Psychiatric:        Behavior: Behavior is cooperative.        Thought Content: Thought content does not include homicidal or suicidal ideation. Thought content does not include suicidal plan.   Review of Systems  Constitutional:  Negative for chills, diaphoresis, fever, malaise/fatigue and weight loss.  HENT:  Negative for congestion.   Respiratory:  Negative for cough and shortness of breath.  Cardiovascular:  Negative for chest pain and palpitations.  Gastrointestinal:  Negative for diarrhea, nausea and vomiting.  Neurological:  Negative for dizziness and seizures.  Psychiatric/Behavioral:  Positive for hallucinations. Negative for depression, memory loss, substance abuse and suicidal ideas. The patient is nervous/anxious and has insomnia.   All other systems reviewed and are negative.  Blood pressure 109/76, pulse 97, temperature 97.8 F (36.6 C), temperature source Temporal, resp. rate 18, SpO2 100 %. There is no height or weight on file to calculate BMI.  Past Psychiatric History: Paranoid schizophrenia  Is the patient at risk to self? No  Has the patient been a risk to self in the past 6 months? No .    Has the patient been a risk to self within the distant  past? No   Is the patient a risk to others? Yes   Has the patient been a risk to others in the past 6 months? No   Has the patient been a risk to others within the distant past? No   Past Medical History:  Past Medical History:  Diagnosis Date   Diabetes mellitus without complication (HCC)    GERD (gastroesophageal reflux disease)    Hyperlipidemia    Hypertension    Paranoid schizophrenia (HCC)    Sleep apnea    Uric acid nephrolithiasis    No past surgical history on file.  Family History: No family history on file.  Social History:  Social History   Socioeconomic History   Marital status: Single    Spouse name: Not on file   Number of children: Not on file   Years of education: Not on file   Highest education level: Not on file  Occupational History   Not on file  Tobacco Use   Smoking status: Every Day    Packs/day: 1.00    Pack years: 0.00    Types: Cigarettes   Smokeless tobacco: Never  Substance and Sexual Activity   Alcohol use: No   Drug use: No   Sexual activity: Not Currently  Other Topics Concern   Not on file  Social History Narrative   Not on file   Social Determinants of Health   Financial Resource Strain: Not on file  Food Insecurity: Not on file  Transportation Needs: Not on file  Physical Activity: Not on file  Stress: Not on file  Social Connections: Not on file  Intimate Partner Violence: Not on file    SDOH:  SDOH Screenings   Alcohol Screen: Not on file  Depression (PHQ2-9): Not on file  Financial Resource Strain: Not on file  Food Insecurity: Not on file  Housing: Not on file  Physical Activity: Not on file  Social Connections: Not on file  Stress: Not on file  Tobacco Use: High Risk   Smoking Tobacco Use: Every Day   Smokeless Tobacco Use: Never  Transportation Needs: Not on file    Last Labs:  Admission on 11/13/2020  Component Date Value Ref Range Status   SARS Coronavirus 2 Ag 11/13/2020 Negative  Negative  Preliminary   POC Amphetamine UR 11/13/2020 None Detected  NONE DETECTED (Cut Off Level 1000 ng/mL) Final   POC Secobarbital (BAR) 11/13/2020 None Detected  NONE DETECTED (Cut Off Level 300 ng/mL) Final   POC Buprenorphine (BUP) 11/13/2020 None Detected  NONE DETECTED (Cut Off Level 10 ng/mL) Final   POC Oxazepam (BZO) 11/13/2020 None Detected  NONE DETECTED (Cut Off Level 300 ng/mL) Final   POC Cocaine UR 11/13/2020  None Detected  NONE DETECTED (Cut Off Level 300 ng/mL) Final   POC Methamphetamine UR 11/13/2020 None Detected  NONE DETECTED (Cut Off Level 1000 ng/mL) Final   POC Morphine 11/13/2020 None Detected  NONE DETECTED (Cut Off Level 300 ng/mL) Final   POC Oxycodone UR 11/13/2020 None Detected  NONE DETECTED (Cut Off Level 100 ng/mL) Final   POC Methadone UR 11/13/2020 None Detected  NONE DETECTED (Cut Off Level 300 ng/mL) Final   POC Marijuana UR 11/13/2020 None Detected  NONE DETECTED (Cut Off Level 50 ng/mL) Final  Admission on 10/22/2020, Discharged on 10/28/2020  Component Date Value Ref Range Status   Alcohol, Ethyl (B) 10/22/2020 <10  <10 mg/dL Final   Comment: (NOTE) Lowest detectable limit for serum alcohol is 10 mg/dL.  For medical purposes only. Performed at [facility], [address] [place], [name] [address]    WBC 10/22/2020 7.6  4.0 - 10.5 K/uL Final   RBC 10/22/2020 4.59  4.22 - 5.81 MIL/uL Final   Hemoglobin 10/22/2020 12.6 (A) 13.0 - 17.0 g/dL Final   HCT 47/82/9562 39.2  39.0 - 52.0 % Final   MCV 10/22/2020 85.4  80.0 - 100.0 fL Final   MCH 10/22/2020 27.5  26.0 - 34.0 pg Final   MCHC 10/22/2020 32.1  30.0 - 36.0 g/dL Final   RDW 13/12/6576 14.7  11.5 - 15.5 % Final   Platelets 10/22/2020 151  150 - 400 K/uL Final   nRBC 10/22/2020 0.0  0.0 - 0.2 % Final   Performed at [facility], [address], [place], [name] [address]   SARS Coronavirus 2 by RT PCR 10/22/2020 NEGATIVE  NEGATIVE Final   Comment: (NOTE) SARS-CoV-2 target nucleic acids  are NOT DETECTED.  The SARS-CoV-2 RNA is generally detectable in upper respiratory specimens during the acute phase of infection. The lowest concentration of SARS-CoV-2 viral copies this assay can detect is 138 copies/mL. A negative result does not preclude SARS-Cov-2 infection and should not be used as the sole basis for treatment or other patient management decisions. A negative result may occur with  improper specimen collection/handling, submission of specimen other than nasopharyngeal swab, presence of viral mutation(s) within the areas targeted by this assay, and inadequate number of viral copies(<138 copies/mL). A negative result must be combined with clinical observations, patient history, and epidemiological information. The expected result is Negative.  Fact Sheet for Patients:  BloggerCourse.com  Fact Sheet for Healthcare Providers:  SeriousBroker.it  This test is no                          t yet approved or cleared by the [place] FDA and  has been authorized for detection and/or diagnosis of SARS-CoV-2 by FDA under an Emergency Use Authorization (EUA). This EUA will remain  in effect (meaning this test can be used) for the duration of the COVID-19 declaration under Section 564(b)(1) of the Act, 21 U.S.C.section 360bbb-3(b)(1), unless the authorization is terminated  or revoked sooner.       Influenza A by PCR 10/22/2020 NEGATIVE  NEGATIVE Final   Influenza B by PCR 10/22/2020 NEGATIVE  NEGATIVE Final   Comment: (NOTE) The Xpert Xpress SARS-CoV-2/FLU/RSV plus assay is intended as an aid in the diagnosis of influenza from Nasopharyngeal swab specimens and should not be used as a sole basis for treatment. Nasal washings and aspirates are unacceptable for Xpert Xpress SARS-CoV-2/FLU/RSV testing.  Fact Sheet for Patients:  BloggerCourse.com  Fact Sheet for Healthcare  Providers: SeriousBroker.it  This test is not yet approved or cleared by the [place] FDA and has been authorized for detection and/or diagnosis of SARS-CoV-2 by FDA under an Emergency Use Authorization (EUA). This EUA will remain in effect (meaning this test can be used) for the duration of the COVID-19 declaration under Section 564(b)(1) of the Act, 21 U.S.C. section 360bbb-3(b)(1), unless the authorization is terminated or revoked.  Performed at [facility], [address] [place], [name] [address]    Sodium 10/22/2020 144  135 - 145 mmol/L Final   Potassium 10/22/2020 4.4  3.5 - 5.1 mmol/L Final   Chloride 10/22/2020 113 (A) 98 - 111 mmol/L Final   CO2 10/22/2020 18 (A) 22 - 32 mmol/L Final   Glucose, Bld 10/22/2020 99  70 - 99 mg/dL Final   Glucose reference range applies only to samples taken after fasting for at least 8 hours.   BUN 10/22/2020 66 (A) 6 - 20 mg/dL Final   Creatinine, Ser 10/22/2020 3.55 (A) 0.61 - 1.24 mg/dL Final   Calcium 82/42/3536 8.9  8.9 - 10.3 mg/dL Final   Total Protein 14/43/1540 6.2 (A) 6.5 - 8.1 g/dL Final   Albumin 08/67/6195 3.5  3.5 - 5.0 g/dL Final   AST 09/32/6712 46 (A) 15 - 41 U/L Final   ALT 10/22/2020 19  0 - 44 U/L Final   Alkaline Phosphatase 10/22/2020 65  38 - 126 U/L Final   Total Bilirubin 10/22/2020 1.0  0.3 - 1.2 mg/dL Final   GFR, Estimated 10/22/2020 19 (A) >60 mL/min Final   Comment: (NOTE) Calculated using the CKD-EPI Creatinine Equation (2021)    Anion gap 10/22/2020 13  5 - 15 Final   Performed at [facility], [address] [place], [name] [address]   WBC 10/23/2020 12.8 (A) 4.0 - 10.5 K/uL Final   RBC 10/23/2020 4.24  4.22 - 5.81 MIL/uL Final   Hemoglobin 10/23/2020 11.6 (A) 13.0 - 17.0 g/dL Final   HCT 98/33/8250 35.9 (A) 39.0 - 52.0 % Final   MCV 10/23/2020 84.7  80.0 - 100.0 fL Final   MCH 10/23/2020 27.4  26.0 - 34.0 pg Final   MCHC 10/23/2020 32.3  30.0 - 36.0 g/dL  Final   RDW 53/97/6734 14.6  11.5 - 15.5 % Final   Platelets 10/23/2020 262  150 - 400 K/uL Final   nRBC 10/23/2020 0.0  0.0 - 0.2 % Final   Performed at [facility], [address], [place], [name] [address]   Creatinine, Ser 10/23/2020 2.66 (A) 0.61 - 1.24 mg/dL Final   GFR, Estimated 10/23/2020 27 (A) >60 mL/min Final   Comment: (NOTE) Calculated using the CKD-EPI Creatinine Equation (2021) Performed at [facility], [address] [place], [name] [address]    Magnesium 10/23/2020 1.9  1.7 - 2.4 mg/dL Final   Performed at [facility], [address] [place], [name] [address]   Phosphorus 10/23/2020 4.9 (A) 2.5 - 4.6 mg/dL Final   Performed at [facility], [address] [place], [name] [address]   Sodium 10/24/2020 140  135 - 145 mmol/L Final   Potassium 10/24/2020 4.3  3.5 - 5.1 mmol/L Final   Chloride 10/24/2020 107  98 - 111 mmol/L Final   CO2 10/24/2020 24  22 - 32 mmol/L Final   Glucose, Bld 10/24/2020 102 (A) 70 - 99 mg/dL Final   Glucose  reference range applies only to samples taken after fasting for at least 8 hours.   BUN 10/24/2020 46 (A) 6 - 20 mg/dL Final   Creatinine, Ser 10/24/2020 1.75 (A) 0.61 - 1.24 mg/dL Final   Calcium 16/02/9603 9.2  8.9 - 10.3 mg/dL Final   GFR, Estimated 10/24/2020 44 (A) >60 mL/min Final   Comment: (NOTE) Calculated using the CKD-EPI Creatinine Equation (2021)    Anion gap 10/24/2020 9  5 - 15 Final   Performed at [facility], [address] [place], [name] [address]   Glucose-Capillary 10/24/2020 165 (A) 70 - 99 mg/dL Final   Glucose reference range applies only to samples taken after fasting for at least 8 hours.   WBC 10/25/2020 11.2 (A) 4.0 - 10.5 K/uL Final   RBC 10/25/2020 4.67  4.22 - 5.81 MIL/uL Final   Hemoglobin 10/25/2020 12.7 (A) 13.0 - 17.0 g/dL Final   HCT 11/91/4782 38.7 (A) 39.0 - 52.0 % Final   MCV 10/25/2020 82.9  80.0 - 100.0 fL Final   MCH 10/25/2020 27.2  26.0 - 34.0 pg  Final   MCHC 10/25/2020 32.8  30.0 - 36.0 g/dL Final   RDW 95/62/1308 14.2  11.5 - 15.5 % Final   Platelets 10/25/2020 288  150 - 400 K/uL Final   nRBC 10/25/2020 0.0  0.0 - 0.2 % Final   Performed at [facility], [address] [place], [name] [address]   Sodium 10/25/2020 138  135 - 145 mmol/L Final   Potassium 10/25/2020 4.4  3.5 - 5.1 mmol/L Final   Chloride 10/25/2020 106  98 - 111 mmol/L Final   CO2 10/25/2020 22  22 - 32 mmol/L Final   Glucose, Bld 10/25/2020 131 (A) 70 - 99 mg/dL Final   Glucose reference range applies only to samples taken after fasting for at least 8 hours.   BUN 10/25/2020 33 (A) 6 - 20 mg/dL Final   Creatinine, Ser 10/25/2020 1.31 (A) 0.61 - 1.24 mg/dL Final   Calcium 69/62/9528 9.3  8.9 - 10.3 mg/dL Final   Total Protein 41/32/4401 6.2 (A) 6.5 - 8.1 g/dL Final   Albumin 02/72/5366 3.6  3.5 - 5.0 g/dL Final   AST 44/07/4740 40  15 - 41 U/L Final   ALT 10/25/2020 31  0 - 44 U/L Final   Alkaline Phosphatase 10/25/2020 74  38 - 126 U/L Final   Total Bilirubin 10/25/2020 0.5  0.3 - 1.2 mg/dL Final   GFR, Estimated 10/25/2020 >60  >60 mL/min Final   Comment: (NOTE) Calculated using the CKD-EPI Creatinine Equation (2021)    Anion gap 10/25/2020 10  5 - 15 Final   Performed at [facility], [address], [place], [name] [address]   Glucose-Capillary 10/24/2020 134 (A) 70 - 99 mg/dL Final   Glucose reference range applies only to samples taken after fasting for at least 8 hours.   Hgb A1c MFr Bld 10/25/2020 5.3  4.8 - 5.6 % Final   Comment: (NOTE)         Prediabetes: 5.7 - 6.4         Diabetes: >6.4         Glycemic control for adults with diabetes: <7.0    Mean Plasma Glucose 10/25/2020 105  mg/dL Final   Comment: (NOTE) Performed At: [facility] [address] [place], [name] [id number] Jolene Schimke MD [id number]    Glucose-Capillary 10/24/2020 125 (A) 70 - 99 mg/dL Final  Glucose reference range applies only to  samples taken after fasting for at least 8 hours.   Hgb A1c MFr Bld 10/24/2020 5.2  4.8 - 5.6 % Final   Comment: (NOTE)         Prediabetes: 5.7 - 6.4         Diabetes: >6.4         Glycemic control for adults with diabetes: <7.0    Mean Plasma Glucose 10/24/2020 103  mg/dL Final   Comment: (NOTE) Performed At: [facility] [address] [place], [name] [id number] Jolene Schimke MD KV:[id number]    Glucose-Capillary 10/25/2020 129 (A) 70 - 99 mg/dL Final   Glucose reference range applies only to samples taken after fasting for at least 8 hours.   Glucose-Capillary 10/25/2020 164 (A) 70 - 99 mg/dL Final   Glucose reference range applies only to samples taken after fasting for at least 8 hours.   Glucose-Capillary 10/25/2020 109 (A) 70 - 99 mg/dL Final   Glucose reference range applies only to samples taken after fasting for at least 8 hours.   Glucose-Capillary 10/25/2020 151 (A) 70 - 99 mg/dL Final   Glucose reference range applies only to samples taken after fasting for at least 8 hours.   Glucose-Capillary 10/26/2020 168 (A) 70 - 99 mg/dL Final   Glucose reference range applies only to samples taken after fasting for at least 8 hours.   Glucose-Capillary 10/26/2020 124 (A) 70 - 99 mg/dL Final   Glucose reference range applies only to samples taken after fasting for at least 8 hours.   Color, Urine 10/27/2020 YELLOW  YELLOW Final   APPearance 10/27/2020 CLEAR  CLEAR Final   Specific Gravity, Urine 10/27/2020 1.010  1.005 - 1.030 Final   pH 10/27/2020 5.0  5.0 - 8.0 Final   Glucose, UA 10/27/2020 >=500 (A) NEGATIVE mg/dL Final   Hgb urine dipstick 10/27/2020 NEGATIVE  NEGATIVE Final   Bilirubin Urine 10/27/2020 NEGATIVE  NEGATIVE Final   Ketones, ur 10/27/2020 NEGATIVE  NEGATIVE mg/dL Final   Protein, ur 64/33/2951 NEGATIVE  NEGATIVE mg/dL Final   Nitrite 88/41/6606 NEGATIVE  NEGATIVE Final   Leukocytes,Ua 10/27/2020 NEGATIVE  NEGATIVE Final   RBC / HPF 10/27/2020 0-5  0  - 5 RBC/hpf Final   WBC, UA 10/27/2020 0-5  0 - 5 WBC/hpf Final   Bacteria, UA 10/27/2020 RARE (A) NONE SEEN Final   Mucus 10/27/2020 PRESENT   Final   Performed at [facility], [address], [place], [name] [address]   Specimen Description 10/26/2020 URINE, CLEAN CATCH   Final   Special Requests 10/26/2020 NONE   Final   Culture 10/26/2020  (A)  Final                   Value:<10,000 COLONIES/mL INSIGNIFICANT GROWTH Performed at [facility], [address], [place], [name] [address]    Report Status 10/26/2020 10/28/2020 FINAL   Final   Procalcitonin 10/26/2020 0.14  ng/mL Final   Comment:        Interpretation: PCT (Procalcitonin) <= 0.5 ng/mL: Systemic infection (sepsis) is not likely. Local bacterial infection is possible. (NOTE)       Sepsis PCT Algorithm           Lower Respiratory Tract  Infection PCT Algorithm    ----------------------------     ----------------------------         PCT < 0.25 ng/mL                PCT < 0.10 ng/mL          Strongly encourage             Strongly discourage   discontinuation of antibiotics    initiation of antibiotics    ----------------------------     -----------------------------       PCT 0.25 - 0.50 ng/mL            PCT 0.10 - 0.25 ng/mL               OR       >80% decrease in PCT            Discourage initiation of                                            antibiotics      Encourage discontinuation           of antibiotics    ----------------------------     -----------------------------         PCT >= 0.50 ng/mL              PCT 0.26 - 0.50 ng/mL               AND                                 <80% decrease in PCT             Encourage initiation of                                             antibiotics       Encourage continuation           of antibiotics    ----------------------------     -----------------------------        PCT >= 0.50 ng/mL                  PCT > 0.50  ng/mL               AND         increase in PCT                  Strongly encourage                                      initiation of antibiotics    Strongly encourage escalation           of antibiotics                                     -----------------------------  PCT <= 0.25 ng/mL                                                 OR                                        > 80% decrease in PCT                                      Discontinue / Do not initiate                                             antibiotics  Performed at [facility], [address], [place], [name] [address]    Glucose-Capillary 10/26/2020 120 (A) 70 - 99 mg/dL Final   Glucose reference range applies only to samples taken after fasting for at least 8 hours.   Glucose-Capillary 10/26/2020 172 (A) 70 - 99 mg/dL Final   Glucose reference range applies only to samples taken after fasting for at least 8 hours.   Glucose-Capillary 10/27/2020 113 (A) 70 - 99 mg/dL Final   Glucose reference range applies only to samples taken after fasting for at least 8 hours.   Glucose-Capillary 10/27/2020 123 (A) 70 - 99 mg/dL Final   Glucose reference range applies only to samples taken after fasting for at least 8 hours.   Glucose-Capillary 10/27/2020 171 (A) 70 - 99 mg/dL Final   Glucose reference range applies only to samples taken after fasting for at least 8 hours.   Glucose-Capillary 10/27/2020 158 (A) 70 - 99 mg/dL Final   Glucose reference range applies only to samples taken after fasting for at least 8 hours.   Glucose-Capillary 10/28/2020 154 (A) 70 - 99 mg/dL Final   Glucose reference range applies only to samples taken after fasting for at least 8 hours.  Admission on 10/11/2020, Discharged on 10/15/2020  Component Date Value Ref Range Status   Glucose-Capillary 10/11/2020 93  70 - 99 mg/dL Final   Glucose reference range applies only to samples taken after fasting  for at least 8 hours.   Sodium 10/11/2020 140  135 - 145 mmol/L Final   Potassium 10/11/2020 4.7  3.5 - 5.1 mmol/L Final   Chloride 10/11/2020 109  98 - 111 mmol/L Final   CO2 10/11/2020 22  22 - 32 mmol/L Final   Glucose, Bld 10/11/2020 104 (A) 70 - 99 mg/dL Final   Glucose reference range applies only to samples taken after fasting for at least 8 hours.   BUN 10/11/2020 37 (A) 6 - 20 mg/dL Final   Creatinine, Ser 10/11/2020 2.48 (A) 0.61 - 1.24 mg/dL Final   Calcium 60/45/4098 8.9  8.9 - 10.3 mg/dL Final   Total Protein 11/91/4782 5.8 (A) 6.5 - 8.1 g/dL Final   Albumin 95/62/1308 3.2 (A) 3.5 - 5.0 g/dL Final   AST 65/78/4696 22  15 - 41 U/L Final   ALT 10/11/2020 16  0 - 44 U/L Final   Alkaline Phosphatase 10/11/2020 56  38 - 126  U/L Final   Total Bilirubin 10/11/2020 0.6  0.3 - 1.2 mg/dL Final   GFR, Estimated 10/11/2020 29 (A) >60 mL/min Final   Comment: (NOTE) Calculated using the CKD-EPI Creatinine Equation (2021)    Anion gap 10/11/2020 9  5 - 15 Final   Performed at [facility], [address] [place], [name] [address]   Opiates 10/11/2020 NONE DETECTED  NONE DETECTED Final   Cocaine 10/11/2020 NONE DETECTED  NONE DETECTED Final   Benzodiazepines 10/11/2020 NONE DETECTED  NONE DETECTED Final   Amphetamines 10/11/2020 NONE DETECTED  NONE DETECTED Final   Tetrahydrocannabinol 10/11/2020 NONE DETECTED  NONE DETECTED Final   Barbiturates 10/11/2020 NONE DETECTED  NONE DETECTED Final   Comment: (NOTE) DRUG SCREEN FOR MEDICAL PURPOSES ONLY.  IF CONFIRMATION IS NEEDED FOR ANY PURPOSE, NOTIFY LAB WITHIN 5 DAYS.  LOWEST DETECTABLE LIMITS FOR URINE DRUG SCREEN Drug Class                     Cutoff (ng/mL) Amphetamine and metabolites    1000 Barbiturate and metabolites    200 Benzodiazepine                 200 Tricyclics and metabolites     300 Opiates and metabolites        300 Cocaine and metabolites        300 THC                            50 Performed at [facility], [address], [place], [name] [address]    Alcohol, Ethyl (B) 10/11/2020 <10  <10 mg/dL Final   Comment: (NOTE) Lowest detectable limit for serum alcohol is 10 mg/dL.  For medical purposes only. Performed at [facility], [address], [place], [name] [address]    WBC 10/11/2020 10.8 (A) 4.0 - 10.5 K/uL Final   RBC 10/11/2020 4.10 (A) 4.22 - 5.81 MIL/uL Final   Hemoglobin 10/11/2020 11.2 (A) 13.0 - 17.0 g/dL Final   HCT 91/47/8295 35.8 (A) 39.0 - 52.0 % Final   MCV 10/11/2020 87.3  80.0 - 100.0 fL Final   MCH 10/11/2020 27.3  26.0 - 34.0 pg Final   MCHC 10/11/2020 31.3  30.0 - 36.0 g/dL Final   RDW 62/13/0865 14.5  11.5 - 15.5 % Final   Platelets 10/11/2020 202  150 - 400 K/uL Final   nRBC 10/11/2020 0.0  0.0 - 0.2 % Final   Neutrophils Relative % 10/11/2020 73  % Final   Neutro Abs 10/11/2020 7.9 (A) 1.7 - 7.7 K/uL Final   Lymphocytes Relative 10/11/2020 16  % Final   Lymphs Abs 10/11/2020 1.8  0.7 - 4.0 K/uL Final   Monocytes Relative 10/11/2020 10  % Final   Monocytes Absolute 10/11/2020 1.1 (A) 0.1 - 1.0 K/uL Final   Eosinophils Relative 10/11/2020 0  % Final   Eosinophils Absolute 10/11/2020 0.0  0.0 - 0.5 K/uL Final   Basophils Relative 10/11/2020 0  % Final   Basophils Absolute 10/11/2020 0.0  0.0 - 0.1 K/uL Final   Immature Granulocytes 10/11/2020 1  % Final   Abs Immature Granulocytes 10/11/2020 0.05  0.00 - 0.07 K/uL Final   Performed at [facility], [address] [place], [name] [address]   Prothrombin Time 10/11/2020 14.9  11.4 - 15.2 seconds Final   INR 10/11/2020 1.2  0.8 - 1.2  Final   Comment: (NOTE) INR goal varies based on device and disease states. Performed at [facility], [address] [place], [place] [address]    Color, Urine 10/11/2020 STRAW (A) YELLOW Final   APPearance 10/11/2020 CLEAR  CLEAR Final   Specific Gravity, Urine 10/11/2020 1.005  1.005 - 1.030 Final   pH 10/11/2020 5.0  5.0 - 8.0 Final    Glucose, UA 10/11/2020 >=500 (A) NEGATIVE mg/dL Final   Hgb urine dipstick 10/11/2020 NEGATIVE  NEGATIVE Final   Bilirubin Urine 10/11/2020 NEGATIVE  NEGATIVE Final   Ketones, ur 10/11/2020 NEGATIVE  NEGATIVE mg/dL Final   Protein, ur 60/45/409805/21/2022 NEGATIVE  NEGATIVE mg/dL Final   Nitrite 11/91/478205/21/2022 NEGATIVE  NEGATIVE Final   Leukocytes,Ua 10/11/2020 NEGATIVE  NEGATIVE Final   RBC / HPF 10/11/2020 0-5  0 - 5 RBC/hpf Final   WBC, UA 10/11/2020 0-5  0 - 5 WBC/hpf Final   Bacteria, UA 10/11/2020 NONE SEEN  NONE SEEN Final   Mucus 10/11/2020 PRESENT   Final   Hyaline Casts, UA 10/11/2020 PRESENT   Final   Performed at [facility], [address] [place], [place] [address]   Valproic Acid Lvl 10/11/2020 23 (A) 50.0 - 100.0 ug/mL Final   Performed at [facility], [address] [place], [place] [address]   Ammonia 10/11/2020 9  9 - 35 umol/L Final   Performed at [facility], [address] [place], [place] [address]   SARS Coronavirus 2 by RT PCR 10/11/2020 NEGATIVE  NEGATIVE Final   Comment: (NOTE) SARS-CoV-2 target nucleic acids are NOT DETECTED.  The SARS-CoV-2 RNA is generally detectable in upper respiratory specimens during the acute phase of infection. The lowest concentration of SARS-CoV-2 viral copies this assay can detect is 138 copies/mL. A negative result does not preclude SARS-Cov-2 infection and should not be used as the sole basis for treatment or other patient management decisions. A negative result may occur with  improper specimen collection/handling, submission of specimen other than nasopharyngeal swab, presence of viral mutation(s) within the areas targeted by this assay, and inadequate number of viral copies(<138 copies/mL). A negative result must be combined with clinical observations, patient history, and epidemiological information. The expected result is Negative.  Fact Sheet for Patients:  BloggerCourse.comhttps://www.fda.gov/media/152166/download  Fact  Sheet for Healthcare Providers:  SeriousBroker.ithttps://www.fda.gov/media/152162/download  This test is no                          t yet approved or cleared by the [place] FDA and  has been authorized for detection and/or diagnosis of SARS-CoV-2 by FDA under an Emergency Use Authorization (EUA). This EUA will remain  in effect (meaning this test can be used) for the duration of the COVID-19 declaration under Section 564(b)(1) of the Act, 21 U.S.C.section 360bbb-3(b)(1), unless the authorization is terminated  or revoked sooner.       Influenza A by PCR 10/11/2020 NEGATIVE  NEGATIVE Final   Influenza B by PCR 10/11/2020 NEGATIVE  NEGATIVE Final   Comment: (NOTE) The Xpert Xpress SARS-CoV-2/FLU/RSV plus assay is intended as an aid in the diagnosis of influenza from Nasopharyngeal swab specimens and should not be used as a sole basis for treatment. Nasal washings and aspirates are unacceptable for Xpert Xpress SARS-CoV-2/FLU/RSV testing.  Fact Sheet for Patients: BloggerCourse.comhttps://www.fda.gov/media/152166/download  Fact Sheet for Healthcare Providers: SeriousBroker.ithttps://www.fda.gov/media/152162/download  This test is not yet approved or cleared by the [place]  FDA and has been authorized for detection and/or diagnosis of SARS-CoV-2 by FDA under an Emergency Use Authorization (EUA). This EUA will remain in effect (meaning this test can be used) for the duration of the COVID-19 declaration under Section 564(b)(1) of the Act, 21 U.S.C. section 360bbb-3(b)(1), unless the authorization is terminated or revoked.  Performed at [facility], [address], [place], [name] [address]    Sodium 10/12/2020 141  135 - 145 mmol/L Final   Potassium 10/12/2020 4.7  3.5 - 5.1 mmol/L Final   Chloride 10/12/2020 110  98 - 111 mmol/L Final   CO2 10/12/2020 23  22 - 32 mmol/L Final   Glucose, Bld 10/12/2020 85  70 - 99 mg/dL Final   Glucose reference range applies only to samples taken after fasting for at  least 8 hours.   BUN 10/12/2020 31 (A) 6 - 20 mg/dL Final   Creatinine, Ser 10/12/2020 1.52 (A) 0.61 - 1.24 mg/dL Final   Calcium 32/67/1245 8.7 (A) 8.9 - 10.3 mg/dL Final   GFR, Estimated 10/12/2020 52 (A) >60 mL/min Final   Comment: (NOTE) Calculated using the CKD-EPI Creatinine Equation (2021)    Anion gap 10/12/2020 8  5 - 15 Final   Performed at [facility], [address] [place], [name] [address]   WBC 10/12/2020 11.2 (A) 4.0 - 10.5 K/uL Final   RBC 10/12/2020 4.24  4.22 - 5.81 MIL/uL Final   Hemoglobin 10/12/2020 11.8 (A) 13.0 - 17.0 g/dL Final   HCT 33/82/5053 36.8 (A) 39.0 - 52.0 % Final   MCV 10/12/2020 86.8  80.0 - 100.0 fL Final   MCH 10/12/2020 27.8  26.0 - 34.0 pg Final   MCHC 10/12/2020 32.1  30.0 - 36.0 g/dL Final   RDW 97/67/3419 14.6  11.5 - 15.5 % Final   Platelets 10/12/2020 195  150 - 400 K/uL Final   nRBC 10/12/2020 0.0  0.0 - 0.2 % Final   Performed at [facility], [address], [place], [name] [address]   Hgb A1c MFr Bld 10/11/2020 5.2  4.8 - 5.6 % Final   Comment: (NOTE) Pre diabetes:          5.7%-6.4%  Diabetes:              >6.4%  Glycemic control for   <7.0% adults with diabetes    Mean Plasma Glucose 10/11/2020 102.54  mg/dL Final   Performed at [facility], [address] [place], [name] [address]   Uric Acid, Serum 10/11/2020 6.4  3.7 - 8.6 mg/dL Final   Performed at [facility], [address] [place], [name] [address]   FIO2 10/11/2020 100.00   Final   pH, Ven 10/11/2020 7.303  7.250 - 7.430 Final   pCO2, Ven 10/11/2020 47.3  44.0 - 60.0 mmHg Final   pO2, Ven 10/11/2020 <31.0 (A) 32.0 - 45.0 mmHg Final   Comment: CRITICAL RESULT CALLED TO, READ BACK BY AND VERIFIED WITH: O.ORONSEYE,RN 0410 10/12/2020 M.CAMPBELL    Bicarbonate 10/11/2020 22.7  20.0 - 28.0 mmol/L Final   Acid-base deficit 10/11/2020 2.7 (A) 0.0 - 2.0 mmol/L Final   O2 Saturation 10/11/2020 40.3  % Final   Patient temperature 10/11/2020  37.0   Final   Collection site 10/11/2020 ARM   Final   RIGHT   Drawn by 10/11/2020 DRAWN BY PHLEBOTOMY   Final   Sample type 10/11/2020 VENOUS   Final   Performed at  [facility], [address], [place], [name] [address]   Glucose-Capillary 10/11/2020 115 (A) 70 - 99 mg/dL Final   Glucose reference range applies only to samples taken after fasting for at least 8 hours.   Comment 1 10/11/2020 Notify RN   Final   Comment 2 10/11/2020 Document in Chart   Final   Glucose-Capillary 10/12/2020 84  70 - 99 mg/dL Final   Glucose reference range applies only to samples taken after fasting for at least 8 hours.   Comment 1 10/12/2020 Notify RN   Final   Comment 2 10/12/2020 Document in Chart   Final   Glucose-Capillary 10/12/2020 104 (A) 70 - 99 mg/dL Final   Glucose reference range applies only to samples taken after fasting for at least 8 hours.   Comment 1 10/12/2020 Notify RN   Final   Comment 2 10/12/2020 Document in Chart   Final   Glucose-Capillary 10/12/2020 87  70 - 99 mg/dL Final   Glucose reference range applies only to samples taken after fasting for at least 8 hours.   Comment 1 10/12/2020 Notify RN   Final   Comment 2 10/12/2020 Document in Chart   Final   Sodium 10/13/2020 138  135 - 145 mmol/L Final   Potassium 10/13/2020 5.3 (A) 3.5 - 5.1 mmol/L Final   SLIGHT HEMOLYSIS   Chloride 10/13/2020 111  98 - 111 mmol/L Final   CO2 10/13/2020 20 (A) 22 - 32 mmol/L Final   Glucose, Bld 10/13/2020 102 (A) 70 - 99 mg/dL Final   Glucose reference range applies only to samples taken after fasting for at least 8 hours.   BUN 10/13/2020 19  6 - 20 mg/dL Final   Creatinine, Ser 10/13/2020 1.24  0.61 - 1.24 mg/dL Final   Calcium 60/45/4098 8.7 (A) 8.9 - 10.3 mg/dL Final   GFR, Estimated 10/13/2020 >60  >60 mL/min Final   Comment: (NOTE) Calculated using the CKD-EPI Creatinine Equation (2021)    Anion gap 10/13/2020 7  5 - 15 Final   Performed at [facility], [address] [place], [name] [address]   Magnesium 10/13/2020 1.3 (A) 1.7 - 2.4 mg/dL Final   Performed at [facility], [address], [place], [name] [address]   Glucose-Capillary 10/12/2020 241 (A) 70 - 99 mg/dL Final   Glucose reference range applies only to samples taken after fasting for at least 8 hours.   Comment 1 10/12/2020 Notify RN   Final   WBC 10/13/2020 9.2  4.0 - 10.5 K/uL Final   RBC 10/13/2020 4.34  4.22 - 5.81 MIL/uL Final   Hemoglobin 10/13/2020 12.0 (A) 13.0 - 17.0 g/dL Final   HCT 62/13/0865 36.3 (A) 39.0 - 52.0 % Final   MCV 10/13/2020 83.6  80.0 - 100.0 fL Final   MCH 10/13/2020 27.6  26.0 - 34.0 pg Final   MCHC 10/13/2020 33.1  30.0 - 36.0 g/dL Final   RDW 78/46/9629 14.3  11.5 - 15.5 % Final   Platelets 10/13/2020 202  150 - 400 K/uL Final   nRBC 10/13/2020 0.0  0.0 - 0.2 % Final   Performed at [facility], [address], [place], [name] [address]   Glucose-Capillary 10/13/2020 115 (A) 70 - 99 mg/dL Final   Glucose reference range applies only to samples taken after fasting for at least 8 hours.   Glucose-Capillary 10/13/2020 153 (A) 70 - 99 mg/dL Final   Glucose reference range  applies only to samples taken after fasting for at least 8 hours.   Comment 1 10/13/2020 Notify RN   Final   Comment 2 10/13/2020 Document in Chart   Final   Glucose-Capillary 10/13/2020 162 (A) 70 - 99 mg/dL Final   Glucose reference range applies only to samples taken after fasting for at least 8 hours.   Comment 1 10/13/2020 Notify RN   Final   Comment 2 10/13/2020 Document in Chart   Final   Cholesterol 10/14/2020 161  0 - 200 mg/dL Final   Triglycerides 16/02/9603 141  <150 mg/dL Final   HDL 54/01/8118 44  >40 mg/dL Final   Total CHOL/HDL Ratio 10/14/2020 3.7  RATIO Final   VLDL 10/14/2020 28  0 - 40 mg/dL Final   LDL Cholesterol 10/14/2020 89  0 - 99 mg/dL Final   Comment:        Total Cholesterol/HDL:CHD Risk Coronary Heart Disease Risk Table                     Men    Women  1/2 Average Risk   3.4   3.3  Average Risk       5.0   4.4  2 X Average Risk   9.6   7.1  3 X Average Risk  23.4   11.0        Use the calculated Patient Ratio above and the CHD Risk Table to determine the patient's CHD Risk.        ATP III CLASSIFICATION (LDL):  <100     mg/dL   Optimal  147-829  mg/dL   Near or Above                    Optimal  130-159  mg/dL   Borderline  562-130  mg/dL   High  >865     mg/dL   Very High Performed at [facility], [address] [place], [name] [address]    Clozapine Lvl 10/14/2020 127 (A) 350 - 650 ng/mL Final   Comment: (NOTE) This test was developed and its performance characteristics determined by [facility]. It has not been cleared or approved by the Food and Drug Administration.    NorClozapine 10/14/2020 44  Not Estab. ng/mL Final   Comment: (NOTE) This test was developed and its performance characteristics determined by [facility]. It has not been cleared or approved by the Food and Drug Administration.    Total(Cloz+Norcloz) 10/14/2020 171  ng/mL Final   Comment: (NOTE) Patients dosed with 400 mg clozapine daily for 4 weeks were most likely to exhibit a therapeutic effect when the sum of clozapine and norclozapine concentrations were at least 450 ng/mL. Charlott Rakes, et al. Juel Burrow Consensus Guidelines for Therapeutic Drug Monitoring in Psychiatry: Update 2011, Pharmacopsychiatry Sep 2011; 44(6):195-235.                                Detection Limit = 20 Performed At: [facility] [address] [place], [name] [id number] Jolene Schimke MD KG:[id number]    Sodium 10/14/2020 137  135 - 145 mmol/L Final   Potassium 10/14/2020 4.6  3.5 - 5.1 mmol/L Final   Chloride 10/14/2020 108  98 - 111 mmol/L Final   CO2 10/14/2020 21 (A) 22 - 32 mmol/L Final   Glucose, Bld 10/14/2020 140 (A) 70 - 99 mg/dL Final   Glucose  reference range applies only to samples taken after fasting for at least 8  hours.   BUN 10/14/2020 17  6 - 20 mg/dL Final   Creatinine, Ser 10/14/2020 1.13  0.61 - 1.24 mg/dL Final   Calcium 37/85/8850 9.1  8.9 - 10.3 mg/dL Final   GFR, Estimated 10/14/2020 >60  >60 mL/min Final   Comment: (NOTE) Calculated using the CKD-EPI Creatinine Equation (2021)    Anion gap 10/14/2020 8  5 - 15 Final   Performed at [facility], [address] [place], [name] [address]   WBC 10/14/2020 9.1  4.0 - 10.5 K/uL Final   RBC 10/14/2020 4.47  4.22 - 5.81 MIL/uL Final   Hemoglobin 10/14/2020 12.4 (A) 13.0 - 17.0 g/dL Final   HCT 28/78/6767 37.3 (A) 39.0 - 52.0 % Final   MCV 10/14/2020 83.4  80.0 - 100.0 fL Final   MCH 10/14/2020 27.7  26.0 - 34.0 pg Final   MCHC 10/14/2020 33.2  30.0 - 36.0 g/dL Final   RDW 20/94/7096 14.3  11.5 - 15.5 % Final   Platelets 10/14/2020 202  150 - 400 K/uL Final   nRBC 10/14/2020 0.0  0.0 - 0.2 % Final   Performed at [facility], [address], [place], [name] [address]   Magnesium 10/14/2020 1.4 (A) 1.7 - 2.4 mg/dL Final   Performed at [facility], [address] [place], [name] [address]   Phosphorus 10/14/2020 4.2  2.5 - 4.6 mg/dL Final   Performed at [facility], [address], [place], [name] [address]   Glucose-Capillary 10/13/2020 156 (A) 70 - 99 mg/dL Final   Glucose reference range applies only to samples taken after fasting for at least 8 hours.   Glucose-Capillary 10/14/2020 105 (A) 70 - 99 mg/dL Final   Glucose reference range applies only to samples taken after fasting for at least 8 hours.   Glucose-Capillary 10/14/2020 157 (A) 70 - 99 mg/dL Final   Glucose reference range applies only to samples taken after fasting for at least 8 hours.   Glucose-Capillary 10/14/2020 156 (A) 70 - 99 mg/dL Final   Glucose reference range applies only to samples taken after fasting for at least 8 hours.   Valproic Acid Lvl 10/15/2020 29 (A) 50.0 - 100.0 ug/mL Final   Performed at [facility], [address] [place], [name] [address]   Glucose-Capillary 10/14/2020 122 (A) 70 - 99 mg/dL Final   Glucose reference range applies only to samples taken after fasting for at least 8 hours.   Glucose-Capillary 10/15/2020 123 (A) 70 - 99 mg/dL Final   Glucose reference range applies only to samples taken after fasting for at least 8 hours.   Glucose-Capillary 10/15/2020 184 (A) 70 - 99 mg/dL Final   Glucose reference range applies only to samples taken after fasting for at least 8 hours.  Admission on 10/09/2020, Discharged on 10/10/2020  Component Date Value Ref Range Status   Sodium 10/09/2020 138  135 - 145 mmol/L Final   Potassium 10/09/2020 4.6  3.5 - 5.1 mmol/L Final   Chloride 10/09/2020 108  98 - 111 mmol/L Final   CO2 10/09/2020 22  22 - 32 mmol/L Final   Glucose, Bld 10/09/2020 67 (A) 70 - 99 mg/dL Final   Glucose reference range applies only to samples taken after fasting for at least 8 hours.   BUN 10/09/2020 29 (A) 6 - 20 mg/dL Final   Creatinine,  Ser 10/09/2020 1.65 (A) 0.61 - 1.24 mg/dL Final   Calcium 16/02/9603 9.5  8.9 - 10.3 mg/dL Final   Total Protein 54/01/8118 7.0  6.5 - 8.1 g/dL Final   Albumin 14/78/2956 3.6  3.5 - 5.0 g/dL Final   AST 21/30/8657 24  15 - 41 U/L Final   ALT 10/09/2020 17  0 - 44 U/L Final   Alkaline Phosphatase 10/09/2020 62  38 - 126 U/L Final   Total Bilirubin 10/09/2020 0.5  0.3 - 1.2 mg/dL Final   GFR, Estimated 10/09/2020 48 (A) >60 mL/min Final   Comment: (NOTE) Calculated using the CKD-EPI Creatinine Equation (2021)    Anion gap 10/09/2020 8  5 - 15 Final   Performed at [facility], [address] [place], [name] [address]   Alcohol, Ethyl (B) 10/09/2020 <10  <10 mg/dL Final   Comment: (NOTE) Lowest detectable limit for serum alcohol is 10 mg/dL.  For medical purposes only. Performed at [facility], [address], [place], [name] [address]    Salicylate Lvl 10/09/2020 <7.0 (A) 7.0 - 30.0 mg/dL  Final   Performed at [facility], [address], [place], [name] [address]   Acetaminophen (Tylenol), Serum 10/09/2020 <10 (A) 10 - 30 ug/mL Final   Comment: (NOTE) Therapeutic concentrations vary significantly. A range of 10-30 ug/mL  may be an effective concentration for many patients. However, some  are best treated at concentrations outside of this range. Acetaminophen concentrations >150 ug/mL at 4 hours after ingestion  and >50 ug/mL at 12 hours after ingestion are often associated with  toxic reactions.  Performed at [facility], [address], [place], [name] [address]    WBC 10/09/2020 9.6  4.0 - 10.5 K/uL Final   RBC 10/09/2020 4.65  4.22 - 5.81 MIL/uL Final   Hemoglobin 10/09/2020 12.8 (A) 13.0 - 17.0 g/dL Final   HCT 72/53/6644 40.2  39.0 - 52.0 % Final   MCV 10/09/2020 86.5  80.0 - 100.0 fL Final   MCH 10/09/2020 27.5  26.0 - 34.0 pg Final   MCHC 10/09/2020 31.8  30.0 - 36.0 g/dL Final   RDW 03/47/4259 14.6  11.5 - 15.5 % Final   Platelets 10/09/2020 210  150 - 400 K/uL Final   nRBC 10/09/2020 0.0  0.0 - 0.2 % Final   Performed at [facility], [address], [place], [name] [address]   Opiates 10/09/2020 NONE DETECTED  NONE DETECTED Final   Cocaine 10/09/2020 NONE DETECTED  NONE DETECTED Final   Benzodiazepines 10/09/2020 NONE DETECTED  NONE DETECTED Final   Amphetamines 10/09/2020 NONE DETECTED  NONE DETECTED Final   Tetrahydrocannabinol 10/09/2020 NONE DETECTED  NONE DETECTED Final   Barbiturates 10/09/2020 NONE DETECTED  NONE DETECTED Final   Comment: (NOTE) DRUG SCREEN FOR MEDICAL PURPOSES ONLY.  IF CONFIRMATION IS NEEDED FOR ANY PURPOSE, NOTIFY LAB WITHIN 5 DAYS.  LOWEST DETECTABLE LIMITS FOR URINE DRUG SCREEN Drug Class                     Cutoff (ng/mL) Amphetamine and metabolites    1000 Barbiturate and metabolites    200 Benzodiazepine                 200 Tricyclics and metabolites      300 Opiates and metabolites        300 Cocaine and metabolites        300 THC  50 Performed at [facility], [address] [place], [name] [address]    Glucose-Capillary 10/09/2020 111 (A) 70 - 99 mg/dL Final   Glucose reference range applies only to samples taken after fasting for at least 8 hours.   Comment 1 10/09/2020 Notify RN   Final   Comment 2 10/09/2020 Document in Chart   Final  Admission on 10/05/2020, Discharged on 10/05/2020  Component Date Value Ref Range Status   WBC 10/05/2020 10.0  4.0 - 10.5 K/uL Final   RBC 10/05/2020 4.47  4.22 - 5.81 MIL/uL Final   Hemoglobin 10/05/2020 12.3 (A) 13.0 - 17.0 g/dL Final   HCT 60/45/4098 38.9 (A) 39.0 - 52.0 % Final   MCV 10/05/2020 87.0  80.0 - 100.0 fL Final   MCH 10/05/2020 27.5  26.0 - 34.0 pg Final   MCHC 10/05/2020 31.6  30.0 - 36.0 g/dL Final   RDW 11/91/4782 14.6  11.5 - 15.5 % Final   Platelets 10/05/2020 247  150 - 400 K/uL Final   nRBC 10/05/2020 0.0  0.0 - 0.2 % Final   Neutrophils Relative % 10/05/2020 71  % Final   Neutro Abs 10/05/2020 7.0  1.7 - 7.7 K/uL Final   Lymphocytes Relative 10/05/2020 21  % Final   Lymphs Abs 10/05/2020 2.1  0.7 - 4.0 K/uL Final   Monocytes Relative 10/05/2020 8  % Final   Monocytes Absolute 10/05/2020 0.8  0.1 - 1.0 K/uL Final   Eosinophils Relative 10/05/2020 0  % Final   Eosinophils Absolute 10/05/2020 0.0  0.0 - 0.5 K/uL Final   Basophils Relative 10/05/2020 0  % Final   Basophils Absolute 10/05/2020 0.0  0.0 - 0.1 K/uL Final   Immature Granulocytes 10/05/2020 0  % Final   Abs Immature Granulocytes 10/05/2020 0.03  0.00 - 0.07 K/uL Final   Performed at [facility], [address], [place], [name] [address]   Sodium 10/05/2020 139  135 - 145 mmol/L Final   Potassium 10/05/2020 5.0  3.5 - 5.1 mmol/L Final   Chloride 10/05/2020 105  98 - 111 mmol/L Final   CO2 10/05/2020 26  22 - 32 mmol/L Final   Glucose, Bld  10/05/2020 95  70 - 99 mg/dL Final   Glucose reference range applies only to samples taken after fasting for at least 8 hours.   BUN 10/05/2020 28 (A) 6 - 20 mg/dL Final   Creatinine, Ser 10/05/2020 1.64 (A) 0.61 - 1.24 mg/dL Final   Calcium 30/86/5784 9.5  8.9 - 10.3 mg/dL Final   Total Protein 69/62/9528 7.0  6.5 - 8.1 g/dL Final   Albumin 41/32/4401 3.7  3.5 - 5.0 g/dL Final   AST 02/72/5366 20  15 - 41 U/L Final   ALT 10/05/2020 25  0 - 44 U/L Final   Alkaline Phosphatase 10/05/2020 66  38 - 126 U/L Final   Total Bilirubin 10/05/2020 0.4  0.3 - 1.2 mg/dL Final   GFR, Estimated 10/05/2020 48 (A) >60 mL/min Final   Comment: (NOTE) Calculated using the CKD-EPI Creatinine Equation (2021)    Anion gap 10/05/2020 8  5 - 15 Final   Performed at [facility], [address], [place], [name] [address]   Valproic Acid Lvl 10/05/2020 31 (A) 50.0 - 100.0 ug/mL Final   Performed at [facility], [address], [place], [name] [address]   Phenytoin Lvl 10/05/2020 <2.5 (A) 10.0 - 20.0 ug/mL Final   Performed at  [facility], [address] [place], [name] [address]  Admission on 06/22/2020, Discharged on 06/22/2020  Component Date Value Ref Range Status   Glucose-Capillary 06/22/2020 168 (A) 70 - 99 mg/dL Final   Glucose reference range applies only to samples taken after fasting for at least 8 hours.   WBC 06/22/2020 8.5  4.0 - 10.5 K/uL Final   RBC 06/22/2020 5.40  4.22 - 5.81 MIL/uL Final   Hemoglobin 06/22/2020 14.7  13.0 - 17.0 g/dL Final   HCT 46/96/2952 45.3  39.0 - 52.0 % Final   MCV 06/22/2020 83.9  80.0 - 100.0 fL Final   MCH 06/22/2020 27.2  26.0 - 34.0 pg Final   MCHC 06/22/2020 32.5  30.0 - 36.0 g/dL Final   RDW 84/13/2440 16.3 (A) 11.5 - 15.5 % Final   Platelets 06/22/2020 130 (A) 150 - 400 K/uL Final   Comment: REPEATED TO VERIFY PLATELET COUNT CONFIRMED BY SMEAR SPECIMEN CHECKED FOR CLOTS    nRBC 06/22/2020 0.0   0.0 - 0.2 % Final   Neutrophils Relative % 06/22/2020 52  % Final   Neutro Abs 06/22/2020 4.5  1.7 - 7.7 K/uL Final   Lymphocytes Relative 06/22/2020 34  % Final   Lymphs Abs 06/22/2020 2.9  0.7 - 4.0 K/uL Final   Monocytes Relative 06/22/2020 12  % Final   Monocytes Absolute 06/22/2020 1.0  0.1 - 1.0 K/uL Final   Eosinophils Relative 06/22/2020 1  % Final   Eosinophils Absolute 06/22/2020 0.1  0.0 - 0.5 K/uL Final   Basophils Relative 06/22/2020 0  % Final   Basophils Absolute 06/22/2020 0.0  0.0 - 0.1 K/uL Final   Immature Granulocytes 06/22/2020 1  % Final   Abs Immature Granulocytes 06/22/2020 0.05  0.00 - 0.07 K/uL Final   Performed at [facility], [address], [place], [name] [address]   Sodium 06/22/2020 135  135 - 145 mmol/L Final   Potassium 06/22/2020 5.1  3.5 - 5.1 mmol/L Final   Chloride 06/22/2020 102  98 - 111 mmol/L Final   CO2 06/22/2020 24  22 - 32 mmol/L Final   Glucose, Bld 06/22/2020 139 (A) 70 - 99 mg/dL Final   Glucose reference range applies only to samples taken after fasting for at least 8 hours.   BUN 06/22/2020 31 (A) 6 - 20 mg/dL Final   Creatinine, Ser 06/22/2020 1.75 (A) 0.61 - 1.24 mg/dL Final   Calcium 53/66/4403 8.8 (A) 8.9 - 10.3 mg/dL Final   Total Protein 47/42/5956 7.2  6.5 - 8.1 g/dL Final   Albumin 38/75/6433 3.4 (A) 3.5 - 5.0 g/dL Final   AST 29/51/8841 24  15 - 41 U/L Final   ALT 06/22/2020 13  0 - 44 U/L Final   Alkaline Phosphatase 06/22/2020 61  38 - 126 U/L Final   Total Bilirubin 06/22/2020 0.4  0.3 - 1.2 mg/dL Final   GFR, Estimated 06/22/2020 44 (A) >60 mL/min Final   Comment: (NOTE) Calculated using the CKD-EPI Creatinine Equation (2021)    Anion gap 06/22/2020 9  5 - 15 Final   Performed at [facility], [address] [place], [name] [address]   Valproic Acid Lvl 06/22/2020 90  50.0 - 100.0 ug/mL Final   Performed at [facility], [address] [place], [name]  [address]   Prothrombin Time 06/22/2020 12.7  11.4 - 15.2 seconds Final   INR 06/22/2020 1.0  0.8 - 1.2 Final  Comment: (NOTE) INR goal varies based on device and disease states. Performed at [facility], [address] [place], [name] [address]    Salicylate Lvl 06/22/2020 <7.0 (A) 7.0 - 30.0 mg/dL Final   Performed at [facility], [address] [place], [name] [address]   Acetaminophen (Tylenol), Serum 06/22/2020 <10 (A) 10 - 30 ug/mL Final   Comment: (NOTE) Therapeutic concentrations vary significantly. A range of 10-30 ug/mL  may be an effective concentration for many patients. However, some  are best treated at concentrations outside of this range. Acetaminophen concentrations >150 ug/mL at 4 hours after ingestion  and >50 ug/mL at 12 hours after ingestion are often associated with  toxic reactions.  Performed at [facility], [address], [place], [name] [address]    Alcohol, Ethyl (B) 06/22/2020 <10  <10 mg/dL Final   Comment: (NOTE) Lowest detectable limit for serum alcohol is 10 mg/dL.  For medical purposes only. Performed at [facility], [address], [place], [name] [address]    SARS Coronavirus 2 06/22/2020 NEGATIVE  NEGATIVE Final   Comment: (NOTE) SARS-CoV-2 target nucleic acids are NOT DETECTED.  The SARS-CoV-2 RNA is generally detectable in upper and lower respiratory specimens during the acute phase of infection. Negative results do not preclude SARS-CoV-2 infection, do not rule out co-infections with other pathogens, and should not be used as the sole basis for treatment or other patient management decisions. Negative results must be combined with clinical observations, patient history, and epidemiological information. The expected result is Negative.  Fact Sheet for Patients: HairSlick.no  Fact Sheet for Healthcare  Providers: quierodirigir.com  This test is not yet approved or cleared by the [place] FDA and  has been authorized for detection and/or diagnosis of SARS-CoV-2 by FDA under an Emergency Use Authorization (EUA). This EUA will remain  in effect (meaning this test can be used) for the duration of the COVID-19 declaration under Se                          ction 564(b)(1) of the Act, 21 U.S.C. section 360bbb-3(b)(1), unless the authorization is terminated or revoked sooner.  Performed at [facility], [address], [place], [name] [address]   Admission on 05/20/2020, Discharged on 05/20/2020  Component Date Value Ref Range Status   Glucose-Capillary 05/20/2020 95  70 - 99 mg/dL Final   Glucose reference range applies only to samples taken after fasting for at least 8 hours.   WBC 05/20/2020 7.0  4.0 - 10.5 K/uL Final   RBC 05/20/2020 5.26  4.22 - 5.81 MIL/uL Final   Hemoglobin 05/20/2020 14.1  13.0 - 17.0 g/dL Final   HCT 13/12/6576 44.3  39.0 - 52.0 % Final   MCV 05/20/2020 84.2  80.0 - 100.0 fL Final   MCH 05/20/2020 26.8  26.0 - 34.0 pg Final   MCHC 05/20/2020 31.8  30.0 - 36.0 g/dL Final   RDW 46/96/2952 15.6 (A) 11.5 - 15.5 % Final   Platelets 05/20/2020 195  150 - 400 K/uL Final   nRBC 05/20/2020 0.0  0.0 - 0.2 % Final   Neutrophils Relative % 05/20/2020 55  % Final   Neutro Abs 05/20/2020 3.8  1.7 - 7.7 K/uL Final   Lymphocytes Relative 05/20/2020 35  % Final   Lymphs Abs 05/20/2020 2.4  0.7 - 4.0 K/uL Final   Monocytes Relative 05/20/2020 9  %  Final   Monocytes Absolute 05/20/2020 0.6  0.1 - 1.0 K/uL Final   Eosinophils Relative 05/20/2020 1  % Final   Eosinophils Absolute 05/20/2020 0.1  0.0 - 0.5 K/uL Final   Basophils Relative 05/20/2020 0  % Final   Basophils Absolute 05/20/2020 0.0  0.0 - 0.1 K/uL Final   Immature Granulocytes 05/20/2020 0  % Final   Abs Immature Granulocytes 05/20/2020 0.02  0.00 - 0.07 K/uL Final   Performed at  [facility], [address], [place], [name] [address]   Sodium 05/20/2020 135  135 - 145 mmol/L Final   Potassium 05/20/2020 4.8  3.5 - 5.1 mmol/L Final   Chloride 05/20/2020 103  98 - 111 mmol/L Final   CO2 05/20/2020 22  22 - 32 mmol/L Final   Glucose, Bld 05/20/2020 93  70 - 99 mg/dL Final   Glucose reference range applies only to samples taken after fasting for at least 8 hours.   BUN 05/20/2020 24 (A) 6 - 20 mg/dL Final   Creatinine, Ser 05/20/2020 1.57 (A) 0.61 - 1.24 mg/dL Final   Calcium 60/45/4098 9.5  8.9 - 10.3 mg/dL Final   GFR, Estimated 05/20/2020 50 (A) >60 mL/min Final   Comment: (NOTE) Calculated using the CKD-EPI Creatinine Equation (2021)    Anion gap 05/20/2020 10  5 - 15 Final   Performed at [facility], [address], [place], [name] [address]   Valproic Acid Lvl 05/20/2020 104 (A) 50.0 - 100.0 ug/mL Final   Performed at [facility], [address], [place], [name] [address]   Troponin I (High Sensitivity) 05/20/2020 5  <18 ng/L Final   Comment: (NOTE) Elevated high sensitivity troponin I (hsTnI) values and significant  changes across serial measurements may suggest ACS but many other  chronic and acute conditions are known to elevate hsTnI results.  Refer to the "Links" section for chest pain algorithms and additional  guidance. Performed at [facility], [address], [place], [name] [address]     Allergies: Cogentin [benztropine], Penicillins, and Prolixin [fluphenazine]  PTA Medications: (Not in a hospital admission)   Medical Decision Making  Patient admitted to continuous assessment for crisis stabilization.  Continue home medications -Seroquel 400 mg daily for mood stability, will give 200 mg tonight -Depakote DR 250 mg twice daily for mood stability -Farxiga 10 mg daily for DM -Norvasc 5 mg daily for hypertension -Trazodone 50 mg nightly as needed for sleep -Magnesium oxide 400 mg daily for nutritional  supplementation -Hydroxyzine 25 mg 4 times daily for anxiety  2345-nursing staff reported that patient attempted to elope.  Patient is slightly agitated.  Placed order for Ativan 2 mg oral or IM.  Placed orders for agitation protocol -Haldol 5 mg every 8 hours p.o. or IM as needed -benadryl 25 mg p.o. or IM every 8 hours as needed -Ativan 2 mg p.o. or IM every 8 hours as needed  Clinical Course as of 11/14/20 0153  Fri Nov 14, 2020  0052 POCT Urine Drug Screen - (ICup)(!) UDS negative [JB]  0117 Hemoglobin(!): 12.8 Hgb slightly decreased at 12.8, CBC otherwise unremarkable  [JB]  0118 EKG 12-Lead Vent. rate 76 BPM PR interval 160 ms QRS duration 82 ms QT/QTcB 344/387 ms P-R-T axes 55 43 49 Normal sinus rhythm Normal ECG [JB]  0152 Hemoglobin A1C: 5.3 [JB]  0152 Valproic Acid,S: 57 [JB]  0152 Lipid panel Lipid Panel unremarkable [JB]  0153 SARS Coronavirus 2  by RT PCR: NEGATIVE [JB]  0153 TSH: 2.399 [JB]    Clinical Course User Index [JB] Jackelyn Poling, NP    Recommendations  Based on my evaluation the patient does not appear to have an emergency medical condition.  Jackelyn Poling, NP 11/13/20  10:54 PM

## 2020-11-13 NOTE — ED Notes (Signed)
No belongings
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
## 2020-11-14 ENCOUNTER — Other Ambulatory Visit (HOSPITAL_COMMUNITY): Payer: Self-pay

## 2020-11-14 LAB — HEMOGLOBIN A1C
Hgb A1c MFr Bld: 5.3 % (ref 4.8–5.6)
Mean Plasma Glucose: 105.41 mg/dL

## 2020-11-14 LAB — CBC WITH DIFFERENTIAL/PLATELET
Abs Immature Granulocytes: 0.02 10*3/uL (ref 0.00–0.07)
Basophils Absolute: 0 10*3/uL (ref 0.0–0.1)
Basophils Relative: 0 %
Eosinophils Absolute: 0.1 10*3/uL (ref 0.0–0.5)
Eosinophils Relative: 1 %
HCT: 40.9 % (ref 39.0–52.0)
Hemoglobin: 12.8 g/dL — ABNORMAL LOW (ref 13.0–17.0)
Immature Granulocytes: 0 %
Lymphocytes Relative: 40 %
Lymphs Abs: 3.2 10*3/uL (ref 0.7–4.0)
MCH: 26.2 pg (ref 26.0–34.0)
MCHC: 31.3 g/dL (ref 30.0–36.0)
MCV: 83.6 fL (ref 80.0–100.0)
Monocytes Absolute: 1.1 10*3/uL — ABNORMAL HIGH (ref 0.1–1.0)
Monocytes Relative: 14 %
Neutro Abs: 3.5 10*3/uL (ref 1.7–7.7)
Neutrophils Relative %: 45 %
Platelets: 293 10*3/uL (ref 150–400)
RBC: 4.89 MIL/uL (ref 4.22–5.81)
RDW: 14.5 % (ref 11.5–15.5)
WBC: 8 10*3/uL (ref 4.0–10.5)
nRBC: 0 % (ref 0.0–0.2)

## 2020-11-14 LAB — COMPREHENSIVE METABOLIC PANEL
ALT: 12 U/L (ref 0–44)
AST: 16 U/L (ref 15–41)
Albumin: 3.6 g/dL (ref 3.5–5.0)
Alkaline Phosphatase: 80 U/L (ref 38–126)
Anion gap: 10 (ref 5–15)
BUN: 25 mg/dL — ABNORMAL HIGH (ref 6–20)
CO2: 24 mmol/L (ref 22–32)
Calcium: 9.4 mg/dL (ref 8.9–10.3)
Chloride: 102 mmol/L (ref 98–111)
Creatinine, Ser: 1.78 mg/dL — ABNORMAL HIGH (ref 0.61–1.24)
GFR, Estimated: 43 mL/min — ABNORMAL LOW (ref >60)
Glucose, Bld: 72 mg/dL (ref 70–99)
Potassium: 4.9 mmol/L (ref 3.5–5.1)
Sodium: 136 mmol/L (ref 135–145)
Total Bilirubin: 0.6 mg/dL (ref 0.3–1.2)
Total Protein: 6.5 g/dL (ref 6.5–8.1)

## 2020-11-14 LAB — LIPID PANEL
Cholesterol: 168 mg/dL (ref 0–200)
HDL: 50 mg/dL (ref >40)
LDL Cholesterol: 99 mg/dL (ref 0–99)
Total CHOL/HDL Ratio: 3.4 RATIO
Triglycerides: 95 mg/dL (ref <150)
VLDL: 19 mg/dL (ref 0–40)

## 2020-11-14 LAB — RESP PANEL BY RT-PCR (FLU A&B, COVID) ARPGX2
Influenza A by PCR: NEGATIVE
Influenza B by PCR: NEGATIVE
SARS Coronavirus 2 by RT PCR: NEGATIVE

## 2020-11-14 LAB — VALPROIC ACID LEVEL: Valproic Acid Lvl: 57 ug/mL (ref 50.0–100.0)

## 2020-11-14 LAB — TSH: TSH: 2.399 u[IU]/mL (ref 0.350–4.500)

## 2020-11-14 MED ORDER — LORAZEPAM 2 MG/ML IJ SOLN: 2.0000 mg | Freq: Three times a day (TID) | INTRAMUSCULAR | Status: DC | PRN

## 2020-11-14 MED ORDER — DIVALPROEX SODIUM 250 MG PO DR TAB: 250.0000 mg | DELAYED_RELEASE_TABLET | Freq: Three times a day (TID) | ORAL | 0 refills | Status: DC

## 2020-11-14 MED ORDER — DIPHENHYDRAMINE HCL 50 MG/ML IJ SOLN: 25.0000 mg | Freq: Three times a day (TID) | INTRAMUSCULAR | Status: DC | PRN

## 2020-11-14 MED ORDER — AMLODIPINE BESYLATE 5 MG PO TABS
5.0000 mg | ORAL_TABLET | Freq: Every day | ORAL | 3 refills | Status: DC
  Filled 2020-11-14: qty 30, 30d supply, fill #0

## 2020-11-14 MED ORDER — DIPHENHYDRAMINE HCL 25 MG PO CAPS: 25.0000 mg | ORAL_CAPSULE | Freq: Three times a day (TID) | ORAL | Status: DC | PRN

## 2020-11-14 MED ORDER — LORAZEPAM 1 MG PO TABS: 2.0000 mg | ORAL_TABLET | Freq: Three times a day (TID) | ORAL | Status: DC | PRN

## 2020-11-14 MED ORDER — DIVALPROEX SODIUM 250 MG PO DR TAB: 250.0000 mg | DELAYED_RELEASE_TABLET | Freq: Two times a day (BID) | ORAL | Status: DC

## 2020-11-14 MED ORDER — HALOPERIDOL 5 MG PO TABS: 5.0000 mg | ORAL_TABLET | Freq: Three times a day (TID) | ORAL | Status: DC | PRN

## 2020-11-14 MED ORDER — HALOPERIDOL LACTATE 5 MG/ML IJ SOLN: 5.0000 mg | Freq: Three times a day (TID) | INTRAMUSCULAR | Status: DC | PRN

## 2020-11-14 NOTE — ED Notes (Signed)
Resting with eyes closed. Rise and fall of chest noted. No new issues noted at this time. Will continue to monitor for safety on the unit 

## 2020-11-14 NOTE — ED Notes (Signed)
Pt remains asleep no distress noted

## 2020-11-14 NOTE — ED Notes (Signed)
Slept well after ativan at HS , OOB at this time to BR gait steady w/o assist.

## 2020-11-14 NOTE — Discharge Instructions (Addendum)
Patient is instructed to take all prescribed medications as recommended. Report any side effects or adverse reactions to your outpatient psychiatrist. Patient is instructed to abstain from alcohol and illegal drugs while on prescription medications. In the event of worsening symptoms, patient is instructed to call the crisis hotline, 911, or go to the nearest emergency department for evaluation and treatment.   

## 2020-11-14 NOTE — ED Provider Notes (Signed)
FBC/OBS ASAP Discharge Summary  Date and Time: 11/14/2020 1:47 PM  Name: Ronald Roman  MRN:  [id number]   Discharge Diagnoses:  Final diagnoses:  Schizophrenia, paranoid (HCC)  Involuntary commitment    Subjective: He reports that he is doing better today. He reports no SI, HI, or AVH. He reported sleeping ok. He reported eating ok. When asked if he would like to be discharged or be admitted for continued treatment he reported that he wanted to be discharged. Discussed that I would be increasing his Depakote and he was agreeable to this. He had no other concerns.  Stay Summary: Patient presented on 6/23 under IVC due to aggressive behavior and standing in the middle of the street. The patient was restarted on his home medications. He did have one episode of agitation but then had a restful night and had no other issues while hospitalized. He reported no SI, HI, or AVH. While talking with his group home director confirmed his outpatient provider appointment on the 28th. She was concerned he would continue to have outburst. Discussed increasing his Depakote to 250 mg TID to better help with his impulsivity/outbursts. On 6/24 he was picked up by his group home.  Total Time spent with patient: 30 minutes  Past Psychiatric History: Paranoid schizophrenia Past Medical History:  Past Medical History:  Diagnosis Date   Diabetes mellitus without complication (HCC)    GERD (gastroesophageal reflux disease)    Hyperlipidemia    Hypertension    Paranoid schizophrenia (HCC)    Sleep apnea    Uric acid nephrolithiasis    No past surgical history on file. Family History: No family history on file. Family Psychiatric History: Not on file Social History:  Social History   Substance and Sexual Activity  Alcohol Use No     Social History   Substance and Sexual Activity  Drug Use No    Social History   Socioeconomic History   Marital status: Single    Spouse name: Not on file   Number  of children: Not on file   Years of education: Not on file   Highest education level: Not on file  Occupational History   Not on file  Tobacco Use   Smoking status: Every Day    Packs/day: 1.00    Pack years: 0.00    Types: Cigarettes   Smokeless tobacco: Never  Substance and Sexual Activity   Alcohol use: No   Drug use: No   Sexual activity: Not Currently  Other Topics Concern   Not on file  Social History Narrative   Not on file   Social Determinants of Health   Financial Resource Strain: Not on file  Food Insecurity: Not on file  Transportation Needs: Not on file  Physical Activity: Not on file  Stress: Not on file  Social Connections: Not on file   SDOH:  SDOH Screenings   Alcohol Screen: Not on file  Depression (PHQ2-9): Not on file  Financial Resource Strain: Not on file  Food Insecurity: Not on file  Housing: Not on file  Physical Activity: Not on file  Social Connections: Not on file  Stress: Not on file  Tobacco Use: High Risk   Smoking Tobacco Use: Every Day   Smokeless Tobacco Use: Never  Transportation Needs: Not on file    Tobacco Cessation:  A prescription for an FDA-approved tobacco cessation medication was offered at discharge and the patient refused  Current Medications:  Current Facility-Administered Medications  Medication Dose  Route Frequency Provider Last Rate Last Admin   acetaminophen (TYLENOL) tablet 650 mg  650 mg Oral Q6H PRN Jackelyn Poling, NP       alum & mag hydroxide-simeth (MAALOX/MYLANTA) 200-200-20 MG/5ML suspension 30 mL  30 mL Oral Q4H PRN Jackelyn Poling, NP       amLODipine (NORVASC) tablet 5 mg  5 mg Oral Daily Nira Conn A, NP   5 mg at 11/14/20 0845   dapagliflozin propanediol (FARXIGA) tablet 10 mg  10 mg Oral QPC breakfast Nira Conn A, NP       diphenhydrAMINE (BENADRYL) capsule 25 mg  25 mg Oral Q8H PRN Jackelyn Poling, NP       Or   diphenhydrAMINE (BENADRYL) injection 25 mg  25 mg Intramuscular Q8H PRN Nira Conn A, NP       divalproex (DEPAKOTE) DR tablet 250 mg  250 mg Oral BID Nira Conn A, NP   250 mg at 11/14/20 0844   haloperidol (HALDOL) tablet 5 mg  5 mg Oral Q8H PRN Jackelyn Poling, NP       Or   haloperidol lactate (HALDOL) injection 5 mg  5 mg Intramuscular Q8H PRN Nira Conn A, NP       hydrOXYzine (ATARAX/VISTARIL) tablet 25 mg  25 mg Oral QID Nira Conn A, NP   25 mg at 11/14/20 0845   LORazepam (ATIVAN) tablet 2 mg  2 mg Oral Q8H PRN Jackelyn Poling, NP       Or   LORazepam (ATIVAN) injection 2 mg  2 mg Intramuscular Q8H PRN Nira Conn A, NP       magnesium hydroxide (MILK OF MAGNESIA) suspension 30 mL  30 mL Oral Daily PRN Nira Conn A, NP       magnesium oxide (MAG-OX) tablet 400 mg  400 mg Oral Daily Nira Conn A, NP       metFORMIN (GLUCOPHAGE) tablet 1,000 mg  1,000 mg Oral BID WC Nira Conn A, NP   1,000 mg at 11/14/20 0844   QUEtiapine (SEROQUEL) tablet 400 mg  400 mg Oral q AM Nira Conn A, NP   400 mg at 11/14/20 0844   traZODone (DESYREL) tablet 50 mg  50 mg Oral QHS Nira Conn A, NP   50 mg at 11/13/20 2206   Current Outpatient Medications  Medication Sig Dispense Refill   amantadine (SYMMETREL) 100 MG capsule Take 100 mg by mouth 2 (two) times daily.     lisinopril (ZESTRIL) 20 MG tablet Take 20 mg by mouth daily.     QUEtiapine (SEROQUEL) 50 MG tablet Take 50-100 mg by mouth 3 (three) times daily. 100 mg am, 50 mg noon and 100 mg hs     zolpidem (AMBIEN) 5 MG tablet Take 5 mg by mouth at bedtime as needed for sleep.     albuterol (PROVENTIL HFA;VENTOLIN HFA) 108 (90 BASE) MCG/ACT inhaler Inhale 2 puffs into the lungs every 4 (four) hours as needed for wheezing or shortness of breath. 1 Inhaler 0   amLODipine (NORVASC) 5 MG tablet Take 1 tablet (5 mg total) by mouth daily. 30 tablet 3   dapagliflozin propanediol (FARXIGA) 10 MG TABS tablet Take 10 mg by mouth daily after breakfast.     divalproex (DEPAKOTE) 250 MG DR tablet Take 1 tablet (250 mg total)  by mouth 3 (three) times daily. 90 tablet 0   hydrOXYzine (ATARAX/VISTARIL) 25 MG tablet Take 25 mg by mouth 4 (four) times daily.  LANTUS SOLOSTAR 100 UNIT/ML Solostar Pen Inject 20 Units into the skin at bedtime.     magnesium oxide (MAG-OX) 400 (240 Mg) MG tablet Take 1 tablet (400 mg total) by mouth daily. 30 tablet 0   metFORMIN (GLUCOPHAGE) 1000 MG tablet Take 1,000 mg by mouth 2 (two) times daily with a meal.     QUEtiapine (SEROQUEL) 200 MG tablet Take 2 tablets (400 mg total) by mouth in the morning. 60 tablet 1   traZODone (DESYREL) 50 MG tablet Take 1 tablet (50 mg total) by mouth at bedtime. 20 tablet 0    PTA Medications: (Not in a hospital admission)   Musculoskeletal  Strength & Muscle Tone: within normal limits Gait & Station: normal Patient leans: N/A  Psychiatric Specialty Exam  Presentation  General Appearance: Appropriate for Environment; Fairly Groomed  Eye Contact:Fair  Speech:Garbled  Speech Volume:Normal  Handedness:Right   Mood and Affect  Mood:Euthymic  Affect:Flat   Thought Process  Thought Processes:Linear  Descriptions of Associations:Intact  Orientation:Partial  Thought Content:Perseveration  Diagnosis of Schizophrenia or Schizoaffective disorder in past: Yes  Duration of Psychotic Symptoms: Greater than six months   Hallucinations:Hallucinations: Visual Description of Visual Hallucinations: reports seeing 2 cobras  Ideas of Reference:Delusions  Suicidal Thoughts:Suicidal Thoughts: No  Homicidal Thoughts:Homicidal Thoughts: No   Sensorium  Memory:Immediate Fair; Recent Poor; Remote Poor  Judgment:Impaired  Insight:Lacking   Executive Functions  Concentration:Poor  Attention Span:Poor  Recall:Poor  Fund of Knowledge:Poor  Language:Fair   Psychomotor Activity  Psychomotor Activity:Psychomotor Activity: Normal   Assets  Assets:Financial Resources/Insurance; Housing; Resilience   Sleep  Sleep:Sleep:  Fair   Nutritional Assessment (For OBS and FBC admissions only) Has the patient had a weight loss or gain of 10 pounds or more in the last 3 months?: No Has the patient had a decrease in food intake/or appetite?: No Does the patient have dental problems?: No Does the patient have eating habits or behaviors that may be indicators of an eating disorder including binging or inducing vomiting?: No Has the patient recently lost weight without trying?: No Has the patient been eating poorly because of a decreased appetite?: No Malnutrition Screening Tool Score: 0   Physical Exam  Physical Exam Vitals and nursing note reviewed.  Constitutional:      General: He is not in acute distress.    Appearance: Normal appearance. He is normal weight. He is not ill-appearing or toxic-appearing.  HENT:     Head: Normocephalic and atraumatic.  Cardiovascular:     Rate and Rhythm: Normal rate.  Pulmonary:     Effort: Pulmonary effort is normal.  Musculoskeletal:        General: Normal range of motion.  Neurological:     Mental Status: He is alert.   Review of Systems  Constitutional:  Negative for chills.  Respiratory:  Negative for cough and shortness of breath.   Cardiovascular:  Negative for chest pain.  Neurological:  Negative for weakness and headaches.  Psychiatric/Behavioral:  Negative for depression and suicidal ideas. The patient is not nervous/anxious.   Blood pressure 112/87, pulse 96, temperature 98.7 F (37.1 C), temperature source Oral, resp. rate 18, SpO2 100 %. There is no height or weight on file to calculate BMI.  Demographic Factors:  Male and Low socioeconomic status  Loss Factors: NA  Historical Factors: Impulsivity  Risk Reduction Factors:   NA  Continued Clinical Symptoms:  Schizophrenia:   Paranoid or undifferentiated type Unstable or Poor Therapeutic Relationship  Cognitive Features That  Contribute To Risk:  Loss of executive function    Suicide Risk:   Mild:  Suicidal ideation of limited frequency, intensity, duration, and specificity.  There are no identifiable plans, no associated intent, mild dysphoria and related symptoms, good self-control (both objective and subjective assessment), few other risk factors, and identifiable protective factors, including available and accessible social support.  Plan Of Care/Follow-up recommendations:  - Activity as tolerated. - Diet as recommended by PCP. - Keep all scheduled follow-up appointments as recommended.   Disposition: Discharge to group home  Lauro FranklinAlexander S Noah Lembke, MD 11/14/2020, 1:47 PM

## 2020-11-18 ENCOUNTER — Other Ambulatory Visit: Payer: Self-pay

## 2020-11-18 ENCOUNTER — Ambulatory Visit (INDEPENDENT_AMBULATORY_CARE_PROVIDER_SITE_OTHER): Payer: Medicaid Other | Admitting: Physician Assistant

## 2020-11-18 ENCOUNTER — Encounter (HOSPITAL_COMMUNITY): Payer: Self-pay | Admitting: Physician Assistant

## 2020-11-18 VITALS — BP 123/102 | HR 68 | Ht 69.0 in | Wt 184.0 lb

## 2020-11-18 DIAGNOSIS — R4189 Other symptoms and signs involving cognitive functions and awareness: Secondary | ICD-10-CM

## 2020-11-18 DIAGNOSIS — F339 Major depressive disorder, recurrent, unspecified: Secondary | ICD-10-CM | POA: Insufficient documentation

## 2020-11-18 DIAGNOSIS — F99 Mental disorder, not otherwise specified: Secondary | ICD-10-CM

## 2020-11-18 DIAGNOSIS — F200 Paranoid schizophrenia: Secondary | ICD-10-CM

## 2020-11-18 DIAGNOSIS — F5105 Insomnia due to other mental disorder: Secondary | ICD-10-CM | POA: Diagnosis not present

## 2020-11-18 MED ORDER — TRAZODONE HCL 100 MG PO TABS: 100.0000 mg | ORAL_TABLET | Freq: Every day | ORAL | 1 refills | Status: DC

## 2020-11-18 MED ORDER — QUETIAPINE FUMARATE 50 MG PO TABS: 50.0000 mg | ORAL_TABLET | Freq: Three times a day (TID) | ORAL | 1 refills | Status: DC

## 2020-11-18 MED ORDER — QUETIAPINE FUMARATE 200 MG PO TABS: 400.0000 mg | ORAL_TABLET | Freq: Every morning | ORAL | 1 refills | Status: DC

## 2020-11-18 MED ORDER — DIVALPROEX SODIUM 250 MG PO DR TAB: 250.0000 mg | DELAYED_RELEASE_TABLET | Freq: Three times a day (TID) | ORAL | 1 refills | Status: DC

## 2020-11-18 NOTE — Progress Notes (Signed)
Psychiatric Initial Adult Assessment   Patient Identification: Ronald Roman MRN:  [id number] Date of Evaluation:  11/18/2020 Referral Source: Behavioral Health Urgent Care Chief Complaint:   Chief Complaint   Medication Management    Visit Diagnosis:    ICD-10-CM   1. Paranoid schizophrenia (HCC)  F20.0 QUEtiapine (SEROQUEL) 200 MG tablet    QUEtiapine (SEROQUEL) 50 MG tablet    divalproex (DEPAKOTE) 250 MG DR tablet    2. Insomnia due to other mental disorder  F51.05 traZODone (DESYREL) 100 MG tablet   F99     3. Episode of recurrent major depressive disorder, unspecified depression episode severity (HCC)  F33.9 QUEtiapine (SEROQUEL) 200 MG tablet    QUEtiapine (SEROQUEL) 50 MG tablet    4. Impaired cognitive ability  R41.89       History of Present Illness:    Ronald Cheuvront. Roman is a 60 year old male with a past psychiatric history significant for depression, paranoid schizophrenia, insomnia, and impaired cognitive function who presents to [facility] for psychiatric evaluation and medication management.  Patient's caregiver is present during the encounter.  Patient was asked the reason for presenting to [facility] to which the patient replied, "I've been in school, I've been working. I lost track of time. I'm moving from my old apartment because people were using my car."  Due to patient being a poor historian, the remainder of the patient's history was obtained from the patient's caregiver.  Per patient's caregiver, the patient is currently taking the following medications:  Depakote 250 mg 3 times daily Quetiapine 400 mg in the morning Quetiapine 50 to 100 mg 3 times daily Trazodone 50 mg at bedtime  Patient reports that his current medication regimen has been helpful, however, patient is still irritable and prone to being violent at times.  Patient was originally taking Clozapine, however, it was discontinued due to the patient's  provider being unable to prescribe the medication. Patient has been off his clozapine for roughly a month and a half.  Per caregiver, patient has had a couple incidences of violent behavior since coming off of his clozapine. Patient's caregiver reports that patient was brought to [facility] after exhibiting constant pacing and violent behavior.  It was also noted that the patient would walk out of the house and into the middle of the intersection.  No other issues or concerns noted today.  A PHQ-9 screen was performed with the patient scoring a 6.  It should be noted that patient is cognitively impaired therefore information provided by the patient may be unreliable.  A GAD-7 screen was initiated but discontinued after patient stated to put threes on each response.  Due to patient's cognitive impairment, a [place] Suicide Severity Screen was not performed.  Patient is calm, cooperative, and fully engaged in conversation during the encounter.  Patient denies suicidal or homicidal ideations at this time.  Patient denied experiencing auditory or visual hallucinations, however, caregiver states that patient often sees snakes, spiders, and people.  Per caregiver, patient sleeps roughly 2 to 3 hours at a time and wakes up a lot at night.  He endorses good appetite and states that the patient eats roughly 3 meals and 2 snacks a day.  Patient does not consume alcohol or use illicit drugs.  Patient uses tobacco products and smokes on average 4 cigarettes/day.  Associated Signs/Symptoms: Depression Symptoms:  depressed mood, insomnia, psychomotor agitation, psychomotor retardation, difficulty concentrating, impaired memory, anxiety, disturbed sleep, (Hypo) Manic Symptoms:  Delusions, Distractibility, Elevated Mood, Flight of Ideas, Hallucinations, Impulsivity, Irritable Mood, Labiality of Mood, Anxiety Symptoms:  Specific Phobias, Psychotic Symptoms:  Delusions, Hallucinations:  Visual Paranoia, PTSD Symptoms: Had a traumatic exposure:  Per patient's guardian, patient has an unspecified past injury to the posterior aspect of his head. Had a traumatic exposure in the last month:  N/A Re-experiencing:  Unable to accurately assess due to patient being a poor historian Hypervigilance:  Patient will often pace his place of residence, especially at night. Hyperarousal:  Difficulty Concentrating Irritability/Anger Sleep Avoidance:  Unable to assess due to patient being a poor historian  Past Psychiatric History:  Impaired cognitive function Depression Paranoid schizophrenia Insomnia  Previous Psychotropic Medications: Yes   Substance Abuse History in the last 12 months:  No.  Consequences of Substance Abuse: Negative  Past Medical History:  Past Medical History:  Diagnosis Date   Diabetes mellitus without complication (HCC)    GERD (gastroesophageal reflux disease)    Hyperlipidemia    Hypertension    Paranoid schizophrenia (HCC)    Sleep apnea    Uric acid nephrolithiasis    History reviewed. No pertinent surgical history.  Family Psychiatric History:  Unknown if the patient has a past history of psychiatric history  Family History: History reviewed. No pertinent family history.  Social History:   Social History   Socioeconomic History   Marital status: Single    Spouse name: Not on file   Number of children: Not on file   Years of education: Not on file   Highest education level: Not on file  Occupational History   Not on file  Tobacco Use   Smoking status: Every Day    Packs/day: 1.00    Pack years: 0.00    Types: Cigarettes   Smokeless tobacco: Never  Substance and Sexual Activity   Alcohol use: No   Drug use: No   Sexual activity: Not Currently  Other Topics Concern   Not on file  Social History Narrative   Not on file   Social Determinants of Health   Financial Resource Strain: Not on file  Food Insecurity: Not on file   Transportation Needs: Not on file  Physical Activity: Not on file  Stress: Not on file  Social Connections: Not on file    Additional Social History:  Patient is a resident of [facility]. Patient was originally being prescribed clozapine but has been of the medication due to their provider being unable to prescribed the medication.  Allergies:   Allergies  Allergen Reactions   Cogentin [Benztropine] Other (See Comments)    Per MAR    Penicillins Other (See Comments)    Per MAR - unable to verify patients PCN reaction    Prolixin [Fluphenazine] Other (See Comments)    [facility]     Metabolic Disorder Labs: Lab Results  Component Value Date   HGBA1C 5.3 11/13/2020   MPG 105.41 11/13/2020   MPG 105 10/25/2020   Lab Results  Component Value Date   PROLACTIN 31.5 (H) 03/23/2016   PROLACTIN 36.6 (H) 03/20/2016   Lab Results  Component Value Date   CHOL 168 11/13/2020   TRIG 95 11/13/2020   HDL 50 11/13/2020   CHOLHDL 3.4 11/13/2020   VLDL 19 11/13/2020   LDLCALC 99 11/13/2020   LDLCALC 89 10/14/2020   Lab Results  Component Value Date   TSH 2.399 11/13/2020    Therapeutic Level Labs: No results found for: LITHIUM No results found  for: CBMZ Lab Results  Component Value Date   VALPROATE 57 11/13/2020    Current Medications: Current Outpatient Medications  Medication Sig Dispense Refill   albuterol (PROVENTIL HFA;VENTOLIN HFA) 108 (90 BASE) MCG/ACT inhaler Inhale 2 puffs into the lungs every 4 (four) hours as needed for wheezing or shortness of breath. 1 Inhaler 0   amantadine (SYMMETREL) 100 MG capsule Take 100 mg by mouth 2 (two) times daily.     amLODipine (NORVASC) 5 MG tablet Take 1 tablet (5 mg total) by mouth daily. 30 tablet 3   dapagliflozin propanediol (FARXIGA) 10 MG TABS tablet Take 10 mg by mouth daily after breakfast.     divalproex (DEPAKOTE) 250 MG DR tablet Take 1 tablet (250 mg total) by mouth 3 (three) times daily. 90 tablet 1    hydrOXYzine (ATARAX/VISTARIL) 25 MG tablet Take 25 mg by mouth 4 (four) times daily.     LANTUS SOLOSTAR 100 UNIT/ML Solostar Pen Inject 20 Units into the skin at bedtime.     lisinopril (ZESTRIL) 20 MG tablet Take 20 mg by mouth daily.     magnesium oxide (MAG-OX) 400 (240 Mg) MG tablet Take 1 tablet (400 mg total) by mouth daily. 30 tablet 0   metFORMIN (GLUCOPHAGE) 1000 MG tablet Take 1,000 mg by mouth 2 (two) times daily with a meal.     QUEtiapine (SEROQUEL) 200 MG tablet Take 2 tablets (400 mg total) by mouth in the morning. 60 tablet 1   QUEtiapine (SEROQUEL) 50 MG tablet Take 1-2 tablets (50-100 mg total) by mouth 3 (three) times daily. 100 mg am, 50 mg noon and 100 mg hs 150 tablet 1   traZODone (DESYREL) 100 MG tablet Take 1 tablet (100 mg total) by mouth at bedtime. 30 tablet 1   zolpidem (AMBIEN) 5 MG tablet Take 5 mg by mouth at bedtime as needed for sleep.     No current facility-administered medications for this visit.    Musculoskeletal: Strength & Muscle Tone: within normal limits Gait & Station: normal Patient leans: N/A  Psychiatric Specialty Exam: Review of Systems  Psychiatric/Behavioral:  Positive for behavioral problems, decreased concentration, hallucinations and sleep disturbance. Negative for dysphoric mood, self-injury and suicidal ideas. The patient is nervous/anxious. The patient is not hyperactive.    Blood pressure (!) 123/102, pulse 68, height 5\' 9"  (1.753 m), weight 184 lb (83.5 kg), SpO2 96 %.Body mass index is 27.17 kg/m.  General Appearance: Fairly Groomed  Eye Contact:  Fair  Speech:  Clear and Coherent and Normal Rate  Volume:  Normal  Mood:  Euthymic and Irritable  Affect:  Appropriate and Congruent  Thought Process:  Disorganized and Descriptions of Associations: Tangential  Orientation:  Full (Time, Place, and Person)  Thought Content:  Delusions, Hallucinations: Visual, and Tangential  Suicidal Thoughts:  No  Homicidal Thoughts:  No  Memory:   Immediate;   Fair Recent;   Poor Remote;   Poor  Judgement:  Impaired  Insight:  Lacking  Psychomotor Activity:  Restlessness  Concentration:  Concentration: Fair and Attention Span: Fair  Recall:  of Knowledge:Poor  Language: Fair  Akathisia:  NA  Handed:  Patient states that he is even handed; Per chart review, patient is right handed  AIMS (if indicated):  not done  Assets:  Financial Resources/Insurance Housing Resilience  ADL's:  Impaired  Cognition: Impaired,  Moderate  Sleep:  Poor   Screenings: AIMS    Flowsheet Row [facility]  AIMS Total Score 0      AUDIT    Flowsheet Row [facility]  Alcohol Use Disorder Identification Test Final Score (AUDIT) 0      PHQ2-9    Flowsheet Row Office Visit from 11/18/2020 in [facility]  PHQ-2 Total Score 4  PHQ-9 Total Score 6      Flowsheet Row Office Visit from 11/18/2020 in [facility] ED from 11/13/2020 in [facility] ED to Hosp-Admission (Discharged) from 10/22/2020 in [facility]  C-SSRS RISK CATEGORY No Risk No Risk No Risk       Assessment and Plan:   Savion Washam. Toepfer is a 60 year old male with a past psychiatric history significant for depression, paranoid schizophrenia, insomnia, and impaired cognitive function who presents to [facility] for psychiatric evaluation and medication management.  Patient was recently seen at [facility] after exhibiting violent behavior and frequent pacing. Per caregiver, patient was originally prescribed Clozapine and has been off the medication for a month and a half. Patient's caregiver is interested in the patient going back on his clozapine.  Patient's caregiver is  agreeable to the patient continuing to take his current regimen of medications until that up with Clozapine.  Patient's medications to be e-prescribed to pharmacy of choice.  1. Paranoid schizophrenia (HCC)  - QUEtiapine (SEROQUEL) 200 MG tablet; Take 2 tablets (400 mg total) by mouth in the morning.  Dispense: 60 tablet; Refill: 1 - QUEtiapine (SEROQUEL) 50 MG tablet; Take 1-2 tablets (50-100 mg total) by mouth 3 (three) times daily. 100 mg am, 50 mg noon and 100 mg hs  Dispense: 150 tablet; Refill: 1 - divalproex (DEPAKOTE) 250 MG DR tablet; Take 1 tablet (250 mg total) by mouth 3 (three) times daily.  Dispense: 90 tablet; Refill: 1  2. Insomnia due to other mental disorder  - traZODone (DESYREL) 100 MG tablet; Take 1 tablet (100 mg total) by mouth at bedtime.  Dispense: 30 tablet; Refill: 1  3. Episode of recurrent major depressive disorder, unspecified depression episode severity (HCC)  - QUEtiapine (SEROQUEL) 200 MG tablet; Take 2 tablets (400 mg total) by mouth in the morning.  Dispense: 60 tablet; Refill: 1 - QUEtiapine (SEROQUEL) 50 MG tablet; Take 1-2 tablets (50-100 mg total) by mouth 3 (three) times daily. 100 mg am, 50 mg noon and 100 mg hs  Dispense: 150 tablet; Refill: 1  4. Impaired cognitive ability   Patient to follow up in 6 weeks Provider spent a total of 50 minutes with the patient/reviewing patient's chart  Meta Hatchet, PA 6/28/202211:02 AM

## 2020-11-21 ENCOUNTER — Telehealth (HOSPITAL_COMMUNITY): Payer: Self-pay | Admitting: *Deleted

## 2020-11-21 NOTE — Telephone Encounter (Signed)
Call from [place] owner of patients group home anxious and upset because its the weekend and patient has not gotten his Clozaril. This is a new patient for this clinic, he was seen here for the first time on Tues. He has a history of being on Clozaril but has been off of it for over a month per the owner because the group home no longer has a psych provider. I told her he was no ordered clozaril from [place] but on Seroquel. SHe is anxious about this with the long holiday weekend coming up and he is not on clozaril. Asked her to fax me his labs and I would send them to [facility] via fax today, I would let the provider know of her wish, but that at this late time and day he would not be put on Clozaril tonight and due to the length of time he has been off of it, may have to start over. I called the [facility] pharmacy and spoke with Olegario Messier, the pharmacist who said he more than likely would have to go back to weekly lab draw and start his Clozaril over again. I called Synetta Fail back to explain this about his clozaril needing to be restarted and lab back to weekly for 6 months and only if and when the Dr writes that order. Left this information on her voice mail.

## 2020-11-22 ENCOUNTER — Other Ambulatory Visit: Payer: Self-pay

## 2020-11-22 ENCOUNTER — Ambulatory Visit (HOSPITAL_COMMUNITY)
Admission: EM | Admit: 2020-11-22 | Discharge: 2020-11-22 | Disposition: A | Discharge status: Home / Self Care | Payer: Medicaid Other | Attending: Family | Admitting: Family

## 2020-11-22 DIAGNOSIS — F200 Paranoid schizophrenia: Secondary | ICD-10-CM | POA: Insufficient documentation

## 2020-11-22 DIAGNOSIS — R4689 Other symptoms and signs involving appearance and behavior: Secondary | ICD-10-CM

## 2020-11-22 MED ORDER — HYDROXYZINE HCL 25 MG PO TABS: 50.0000 mg | ORAL_TABLET | Freq: Four times a day (QID) | ORAL | 0 refills | Status: DC

## 2020-11-22 MED ORDER — LORAZEPAM 0.5 MG PO TABS: 0.5000 mg | ORAL_TABLET | Freq: Two times a day (BID) | ORAL | 0 refills | Status: AC

## 2020-11-22 MED ORDER — HYDROXYZINE HCL 50 MG PO TABS: 50.0000 mg | ORAL_TABLET | Freq: Four times a day (QID) | ORAL | 0 refills | Status: DC

## 2020-11-22 NOTE — ED Provider Notes (Signed)
[facility] Medical Screening Exam  Patient Name: Ronald Roman MRN: [id number] Date of Evaluation: 11/22/20 Chief Complaint:   Diagnosis:  Final diagnoses:  Aggression  Paranoid schizophrenia (HCC)    History of Present illness: Ronald Roman is a 60 y.o. male presents accompanied by group home owner and another resident.  Group home owner states that she was fearful of her life which is why she asked another resident to accompany her.  Reports patient has been displaying aggressive behavior.  Stated last night patient placed all his belongings outside in the hallway.  States he often wanders off the property stating that  " I need to get to [employer]."    She reported Ronald Roman was prescribed Clozaril in the past however has been off medications for the past 3 months due to loss of psychiatrist that rounded at her facility.  Denying any other illicit drug use.   Ronald Roman was seen and evaluated.  Awake, alert and oriented x3.  Denying suicidal or homicidal ideations.  Denies auditory or visual hallucinations.  Stated " last night two girls was fighting and and I went to bed."  Reported he has been taking medications that is provided.  Increase hydroxyzine 25 mg to 50 mg p.o. 4 times daily.  And initiated Ativan 0.5 mg p.o. as needed x10 tablets.    She reported that she has plans to involuntarily commit patient and is seeking inpatient admission. Support, encouragement and reassurance was provided.   Psychiatric Specialty Exam  Presentation  General Appearance:Appropriate for Environment; Fairly Groomed  Eye Contact:Fair  Speech:Garbled  Speech Volume:Normal  Handedness:Right   Mood and Affect  Mood:Euthymic  Affect:Flat   Thought Process  Thought Processes:Linear  Descriptions of Associations:Intact  Orientation:Partial  Thought Content:Perseveration  Diagnosis of Schizophrenia or Schizoaffective disorder in past: Yes  Duration of Psychotic Symptoms:  Greater than six months  Hallucinations:Visual reports seeing 2 cobras  Ideas of Reference:Delusions  Suicidal Thoughts:No  Homicidal Thoughts:No   Sensorium  Memory:Immediate Fair; Recent Poor; Remote Poor  Judgment:Impaired  Insight:Lacking   Executive Functions  Concentration:Poor  Attention Span:Poor  Recall:Poor  Fund of Knowledge:Poor  Language:Fair   Psychomotor Activity  Psychomotor Activity:Normal   Assets  Assets:Financial Resources/Insurance; Housing; Resilience   Sleep  Sleep:Fair  Number of hours:  No data recorded  No data recorded  Physical Exam: Physical Exam Vitals reviewed.  Neurological:     Mental Status: He is alert and oriented to person, place, and time.  Psychiatric:        Mood and Affect: Mood is anxious.        Speech: Speech is rapid and pressured and delayed.        Behavior: Behavior is cooperative.        Thought Content: Thought content is paranoid.        Cognition and Memory: Memory is impaired. He exhibits impaired remote memory.   ROS Blood pressure (!) 139/116, pulse 88, temperature 98.7 F (37.1 C), temperature source Oral, resp. rate 16, SpO2 100 %. There is no height or weight on file to calculate BMI.  Musculoskeletal: Strength & Muscle Tone: within normal limits Gait & Station: normal Patient leans: N/A Recommendations: Based on my evaluation the patient does not appear to have an emergency medical condition and can be discharged with resources and follow up care in outpatient services for Medication Management -Discussed titration to hydroxyzine 25 mg to 50 mg p.o. 4 times a day as needed -NP provided  Ativan 0.5 mg p.o. every 12 as needed x10 tablets until patient is able to follow-up with primary care provider. (Group home owner was receptive to plan)   Oneta Rack, NP 11/22/2020, 3:10 PM

## 2020-11-22 NOTE — BH Assessment (Signed)
Pt to [facility] voluntarily with group home manager reporting that pt behaviors are worsening and pt is becoming more aggressive to staff at [facility]. Staff reports pt raised his hand to a staff member the other day (staff unable to share why he raised pt raised his hand), is trying to walk out facility and "trying to go to [facility]" for some reason. Pt seen [facility] provider two days ago for medications and states his next apportionment is in August and she feels she can not wait that long due to pt aggressive behaviors. Staff reports change in pt behaviors when he stop taking Clozapine a couple months ago. Staff reports that pt is not sleeping well and placing belongings in the hall way at the facility making people trip over them. Pt is calm, cooperative, alert and engaged. Pt denies SI, HI, AVH. Does report he wanted to go to [facility] to see a nurse about prolixin injection.

## 2020-11-22 NOTE — ED Notes (Signed)
Patient discharged ambulatory to lobby.  All belongings returned to patient.  Group home leader in lobby and accepted patient.  Patient discharged in stable condition; no acute distress noted.

## 2020-11-22 NOTE — Discharge Instructions (Addendum)
Take all medications as prescribed. Keep all follow-up appointments as scheduled.  Do not consume alcohol or use illegal drugs while on prescription medications. Report any adverse effects from your medications to your primary care provider promptly.  In the event of recurrent symptoms or worsening symptoms, call 911, a crisis hotline, or go to the nearest emergency department for evaluation.   

## 2020-11-25 NOTE — Telephone Encounter (Signed)
Provider was reached out to by Wynona Luna, RN regarding message left by Synetta Fail relating to Clozaril medication for this patient. Provider has enrolled patient in the Clozaril REMs program. Provider will follow up with Nurse to discuss next steps.

## 2020-12-30 ENCOUNTER — Encounter (HOSPITAL_COMMUNITY): Payer: Medicaid Other | Admitting: Physician Assistant

## 2021-03-04 ENCOUNTER — Encounter (HOSPITAL_COMMUNITY): Payer: Medicaid Other | Admitting: Physician Assistant

## 2021-03-12 ENCOUNTER — Encounter (HOSPITAL_COMMUNITY): Payer: Medicaid Other | Admitting: Physician Assistant

## 2021-03-31 ENCOUNTER — Other Ambulatory Visit: Payer: Self-pay

## 2021-03-31 ENCOUNTER — Ambulatory Visit (INDEPENDENT_AMBULATORY_CARE_PROVIDER_SITE_OTHER): Payer: Medicaid Other | Admitting: Physician Assistant

## 2021-03-31 ENCOUNTER — Encounter (HOSPITAL_COMMUNITY): Payer: Self-pay | Admitting: Physician Assistant

## 2021-03-31 VITALS — BP 117/68 | HR 66 | Ht 69.0 in

## 2021-03-31 DIAGNOSIS — R4189 Other symptoms and signs involving cognitive functions and awareness: Secondary | ICD-10-CM

## 2021-03-31 DIAGNOSIS — F339 Major depressive disorder, recurrent, unspecified: Secondary | ICD-10-CM | POA: Diagnosis not present

## 2021-03-31 DIAGNOSIS — F5105 Insomnia due to other mental disorder: Secondary | ICD-10-CM

## 2021-03-31 DIAGNOSIS — F200 Paranoid schizophrenia: Secondary | ICD-10-CM | POA: Diagnosis not present

## 2021-03-31 DIAGNOSIS — F99 Mental disorder, not otherwise specified: Secondary | ICD-10-CM

## 2021-03-31 NOTE — Progress Notes (Addendum)
BH MD/PA/NP OP Progress Note  03/31/2021 11:46 PM Ronald Roman  MRN:  [id number]  Chief Complaint:  Chief Complaint   Medication Management    HPI:   Ronald Roman is a 60 year old male with a past psychiatric history significant for paranoid schizophrenia, insomnia, major depressive disorder, and impaired cognitive abilities he presents to [facility], accompanied by group home staffer Ronald Roman), for follow-up and medication management.  Patient is currently being managed on the following medications:  Seroquel 400 mg in the morning Seroquel 50 mg 1 to 2 tablets 3 times daily Depakote 250 mg DR 3 times daily Trazodone 100 mg at bedtime Hydroxyzine 50 mg 4 times daily as needed for Ativan 0.5 mg every 12 hours as needed  Per group home staffer, patient is doing all right and has been taking all his medications as prescribed.  He denies that the patient is experiencing any adverse side effects from his medications.  He does report that the patient always appears to be nervous.  Per group home resident, he reports that patient should be on Clozaril and was last given Clozaril from [facility] from his primary care provider.  He states that the patient can sometimes be hyperactive and aggressive and is wondering if his Clozaril dosage can be increased.  Provider informed staffer that it was unknown that the patient was taking Clozaril.  Staffer was informed that patient must be set up with Clozaril REMS and CBC w/ differential blood work must be sent in routinely before patient can be set up with Clozaril. Staffer vocalized understanding. Provider informed Staffer to provide an up to date list of medication that the patient is currently taking for the management of his psychiatric symptoms. Patient denies depressive symptoms and anxiety.  He further denied experiencing any outbursts.  A GAD-7 screen was performed with the patient scoring a  7.  Patient is alert and does not appear to be an acute distress.  Patient is cooperative and answers all questions addressed to him.  Patient states that he feels all right.  Patient denies suicidal or homicidal ideations.  He further denied auditory or visual hallucinations and does not appear to be responding to internal/external stimuli.  Patient endorses good sleep.  Patient endorses good appetite.  Per group home staffer, patient eats 3 meals a day.  Patient denies alcohol consumption and illicit drug use.  Patient endorses tobacco use and smokes on average 4 to 5 cigarettes/day.  Visit Diagnosis:    ICD-10-CM   1. Paranoid schizophrenia (HCC)  F20.0     2. Insomnia due to other mental disorder  F51.05    F99     3. Episode of recurrent major depressive disorder, unspecified depression episode severity (HCC)  F33.9     4. Impaired cognitive ability  R41.89       Past Psychiatric History:  Insomnia Major depressive disorder Paranoid schizophrenia Impaired cognitive ability  Past Medical History:  Past Medical History:  Diagnosis Date   Diabetes mellitus without complication (HCC)    GERD (gastroesophageal reflux disease)    Hyperlipidemia    Hypertension    Paranoid schizophrenia (HCC)    Sleep apnea    Uric acid nephrolithiasis    History reviewed. No pertinent surgical history.  Family Psychiatric History:  Unknown if the patient has a past history of psychiatric history  Family History: History reviewed. No pertinent family history.  Social History:  Social History   Socioeconomic History  Marital status: Single    Spouse name: Not on file   Number of children: Not on file   Years of education: Not on file   Highest education level: Not on file  Occupational History   Not on file  Tobacco Use   Smoking status: Every Day    Packs/day: 1.00    Types: Cigarettes   Smokeless tobacco: Never  Substance and Sexual Activity   Alcohol use: No   Drug use: No    Sexual activity: Not Currently  Other Topics Concern   Not on file  Social History Narrative   Not on file   Social Determinants of Health   Financial Resource Strain: Not on file  Food Insecurity: Not on file  Transportation Needs: Not on file  Physical Activity: Not on file  Stress: Not on file  Social Connections: Not on file    Allergies:  Allergies  Allergen Reactions   Cogentin [Benztropine] Other (See Comments)    Per MAR    Penicillins Other (See Comments)    Per MAR - unable to verify patients PCN reaction    Prolixin [Fluphenazine] Other (See Comments)    Per [facility]     Metabolic Disorder Labs: Lab Results  Component Value Date   HGBA1C 5.3 11/13/2020   MPG 105.41 11/13/2020   MPG 105 10/25/2020   Lab Results  Component Value Date   PROLACTIN 31.5 (H) 03/23/2016   PROLACTIN 36.6 (H) 03/20/2016   Lab Results  Component Value Date   CHOL 168 11/13/2020   TRIG 95 11/13/2020   HDL 50 11/13/2020   CHOLHDL 3.4 11/13/2020   VLDL 19 11/13/2020   LDLCALC 99 11/13/2020   LDLCALC 89 10/14/2020   Lab Results  Component Value Date   TSH 2.399 11/13/2020   TSH 0.888 02/10/2020    Therapeutic Level Labs: No results found for: LITHIUM Lab Results  Component Value Date   VALPROATE 57 11/13/2020   VALPROATE 29 (L) 10/15/2020   No components found for:  CBMZ  Current Medications: Current Outpatient Medications  Medication Sig Dispense Refill   albuterol (PROVENTIL HFA;VENTOLIN HFA) 108 (90 BASE) MCG/ACT inhaler Inhale 2 puffs into the lungs every 4 (four) hours as needed for wheezing or shortness of breath. 1 Inhaler 0   amantadine (SYMMETREL) 100 MG capsule Take 100 mg by mouth 2 (two) times daily.     amLODipine (NORVASC) 5 MG tablet Take 1 tablet (5 mg total) by mouth daily. 30 tablet 3   dapagliflozin propanediol (FARXIGA) 10 MG TABS tablet Take 10 mg by mouth daily after breakfast.     divalproex (DEPAKOTE) 250 MG DR tablet Take 1 tablet (250 mg  total) by mouth 3 (three) times daily. 90 tablet 1   hydrOXYzine (ATARAX/VISTARIL) 50 MG tablet Take 1 tablet (50 mg total) by mouth 4 (four) times daily. 60 tablet 0   LANTUS SOLOSTAR 100 UNIT/ML Solostar Pen Inject 20 Units into the skin at bedtime.     lisinopril (ZESTRIL) 20 MG tablet Take 20 mg by mouth daily.     LORazepam (ATIVAN) 0.5 MG tablet Take 1 tablet (0.5 mg total) by mouth 2 (two) times daily. 10 tablet 0   magnesium oxide (MAG-OX) 400 (240 Mg) MG tablet Take 1 tablet (400 mg total) by mouth daily. 30 tablet 0   metFORMIN (GLUCOPHAGE) 1000 MG tablet Take 1,000 mg by mouth 2 (two) times daily with a meal.     QUEtiapine (SEROQUEL) 200 MG tablet Take 2  tablets (400 mg total) by mouth in the morning. 60 tablet 1   QUEtiapine (SEROQUEL) 50 MG tablet Take 1-2 tablets (50-100 mg total) by mouth 3 (three) times daily. 100 mg am, 50 mg noon and 100 mg hs 150 tablet 1   traZODone (DESYREL) 100 MG tablet Take 1 tablet (100 mg total) by mouth at bedtime. 30 tablet 1   zolpidem (AMBIEN) 5 MG tablet Take 5 mg by mouth at bedtime as needed for sleep.     No current facility-administered medications for this visit.     Musculoskeletal: Strength & Muscle Tone: within normal limits Gait & Station: normal Patient leans: N/A  Psychiatric Specialty Exam: Review of Systems  Psychiatric/Behavioral:  Negative for decreased concentration, dysphoric mood, hallucinations, self-injury, sleep disturbance and suicidal ideas. The patient is not nervous/anxious and is not hyperactive.    Blood pressure 117/68, pulse 66, height 5\' 9"  (1.753 m), SpO2 100 %.Body mass index is 27.17 kg/m.  General Appearance: Fairly Groomed  Eye Contact:  Good  Speech:  Clear and Coherent and Normal Rate  Volume:  Normal  Mood:  Euthymic  Affect:  Appropriate  Thought Process:  Coherent and Descriptions of Associations: Loose  Orientation:  Full (Time, Place, and Person)  Thought Content: WDL   Suicidal Thoughts:  No   Homicidal Thoughts:  No  Memory:  Immediate;   Fair Recent;   Poor Remote;   Poor  Judgement:  Impaired  Insight:  Lacking  Psychomotor Activity:  Restlessness  Concentration:  Concentration: Fair and Attention Span: Fair  Recall:  of Knowledge: Poor  Language: Fair  Akathisia:  NA  Handed:  Right  AIMS (if indicated): not done  Assets:  Financial Resources/Insurance Housing Resilience  ADL's:  Impaired  Cognition: Impaired,  Moderate  Sleep:   Patient unable to quantify the amount of sleep he recieves   Screenings: AIMS    Flowsheet Row [facility]  AIMS Total Score 0      AUDIT    Flowsheet Row [facility]  Alcohol Use Disorder Identification Test Final Score (AUDIT) 0      GAD-7    Flowsheet Row Office Visit from 03/31/2021 in [facility]  Total GAD-7 Score 7      PHQ2-9    Flowsheet Row Office Visit from 03/31/2021 in [facility] Office Visit from 11/18/2020 in [facility]  PHQ-2 Total Score 0 4  PHQ-9 Total Score -- 6      Flowsheet Row Office Visit from 03/31/2021 in [facility] Office Visit from 11/18/2020 in [facility] ED from 11/13/2020 in [facility]  C-SSRS RISK CATEGORY No Risk No Risk No Risk        Assessment and Plan:   Ronald Roman is a 60 year old male with a past psychiatric history significant for paranoid schizophrenia, insomnia, major depressive disorder, and impaired cognitive abilities he presents to [facility], accompanied by group home staffer [facility]), for follow-up and medication management.  Patient was last seen by provider on 11/18/2020.  Per group home  staffer patient has been taking his prescribed medications as scheduled. Staffer request that patient's Clozaril be increased.  Provider informed staffer that he was unaware of patient being on Clozaril. Provider informed staffer that patient  must be set up with Clozaril REMS and blood work for CBC w/ differential must be collected and resulted to start Clozaril. Provider informed staffer to bring an updated list of patient's psychiatric medications. Provider to check on patient's Clozaril enrollment status in order to initiate the process for patient to start Clozaril.  1. Paranoid schizophrenia (HCC) Patient to continue taking Seroquel 200 mg (2 tablets in the morning) Patient to take Seroquel 50 mg 1 to 2 tablets by mouth 3 times daily  2. Insomnia due to other mental disorder Patient to continue taking trazodone 100 mg at bedtime  3. Episode of recurrent major depressive disorder, unspecified depression episode severity (HCC) Patient to continue taking Seroquel 200 mg (2 tablets in the morning) Patient to take Seroquel 50 mg 1 to 2 tablets by mouth 3 times daily  4. Impaired cognitive ability  Provider spent a total of 22 minutes with the patient/reviewing the patient's chart  Meta Hatchet, PA 03/31/2021, 11:46 PM

## 2021-04-01 ENCOUNTER — Ambulatory Visit: Payer: Medicaid Other | Admitting: Podiatry

## 2021-04-03 ENCOUNTER — Telehealth (HOSPITAL_COMMUNITY): Payer: Self-pay | Admitting: *Deleted

## 2021-04-03 NOTE — Telephone Encounter (Signed)
Called [facility] group [facility] and spoke with Synetta Fail re current labs. She sent labs yesterday but they were nearly a month old and told her current would be considered within a week. She then faxed over lab from 11/3 and I forwarded it to [facility] to consider. Unsure what he spoke with [name] and group home staff about at [facility] recent appt as I was not present for his appts and there is not a note in the chart from that visit yet. I reminded Synetta Fail again Antwyne is not currently on Clozaril so Link Snuffer would have to call her back if he decides to go forward with it. She states that he is on Clozaril, she dispenses a pill to him every night. This information is new to me as we dont prescribe it, I have not put labs in for him in REMS and he is not currently in the REMS system as he is not on Clozaril.

## 2021-04-29 ENCOUNTER — Ambulatory Visit (INDEPENDENT_AMBULATORY_CARE_PROVIDER_SITE_OTHER): Payer: Medicaid Other | Admitting: Physician Assistant

## 2021-04-29 ENCOUNTER — Other Ambulatory Visit: Payer: Self-pay

## 2021-04-29 ENCOUNTER — Encounter (HOSPITAL_COMMUNITY): Payer: Self-pay | Admitting: Physician Assistant

## 2021-04-29 VITALS — BP 112/67 | HR 89 | Temp 98.7°F | Ht 66.54 in | Wt 155.8 lb

## 2021-04-29 DIAGNOSIS — F339 Major depressive disorder, recurrent, unspecified: Secondary | ICD-10-CM | POA: Diagnosis not present

## 2021-04-29 DIAGNOSIS — F5105 Insomnia due to other mental disorder: Secondary | ICD-10-CM | POA: Diagnosis not present

## 2021-04-29 DIAGNOSIS — R4189 Other symptoms and signs involving cognitive functions and awareness: Secondary | ICD-10-CM | POA: Diagnosis not present

## 2021-04-29 DIAGNOSIS — F200 Paranoid schizophrenia: Secondary | ICD-10-CM

## 2021-04-29 DIAGNOSIS — F99 Mental disorder, not otherwise specified: Secondary | ICD-10-CM

## 2021-04-29 NOTE — Progress Notes (Addendum)
BH MD/PA/NP OP Progress Note  04/29/2021 10:56 PM Ronald Roman  MRN:  [id number]  Chief Complaint: Follow up and medication management  HPI:   Ronald Roman is a 60 year old male with a past psychiatric history significant for, major depressive disorder, paranoid schizophrenia, and impaired cognitive ability who presents to [facility] follow-up and medication management.  With the patient's medications were provided to the provider following the conclusion of the encounter.  Patient is currently being managed on the following medications:  Clozapine 50 mg at bedtime Depakote 250 mg 3 times daily Hydroxyzine 25 mg 3 times daily Seroquel 100 mg at bedtime/25 mg 2 times daily Trazodone 50 mg at bedtime Zolpidem 5 mg at bedtime  Patient reports that he is taking his medications as prescribed while at the group home.  Patient denies any issues with his current regimen of medications.  Patient denies depressive symptoms and further denies anxiety.  Patient denies any other issues regarding his mental health.  A PHQ-9 screen was performed with the patient scoring a 9.  Patient is alert and oriented, calm, cooperative, and engaged in conversation during the encounter.  Patient is able to answer most questions addressed to him during the encounter.  Patient endorses neutral mood.  Patient denies suicidal or homicidal ideations.  He further denies auditory or visual hallucinations.  Patient does not appear to be responding to internal/external stimuli.  Patient states that he receives on average 6 to 7 hours of sleep each night.  Patient endorses good appetite and eats on average 3 meals per day.  Patient states that he does smoke but was unable to give the amount of cigarettes or tobacco products that he uses.  Patient denies alcohol consumption and illicit drug use.  Visit Diagnosis: No diagnosis found.  Past Psychiatric History:  Insomnia Major  depressive disorder Paranoid schizophrenia Impaired cognitive ability  Past Medical History:  Past Medical History:  Diagnosis Date   Diabetes mellitus without complication (HCC)    GERD (gastroesophageal reflux disease)    Hyperlipidemia    Hypertension    Paranoid schizophrenia (HCC)    Sleep apnea    Uric acid nephrolithiasis    History reviewed. No pertinent surgical history.  Family Psychiatric History:  Unknown if the patient has a past history of psychiatric history  Family History: History reviewed. No pertinent family history.  Social History:  Social History   Socioeconomic History   Marital status: Single    Spouse name: Not on file   Number of children: Not on file   Years of education: Not on file   Highest education level: Not on file  Occupational History   Not on file  Tobacco Use   Smoking status: Every Day    Packs/day: 1.00    Types: Cigarettes   Smokeless tobacco: Never  Substance and Sexual Activity   Alcohol use: No   Drug use: No   Sexual activity: Not Currently  Other Topics Concern   Not on file  Social History Narrative   Not on file   Social Determinants of Health   Financial Resource Strain: Not on file  Food Insecurity: Not on file  Transportation Needs: Not on file  Physical Activity: Not on file  Stress: Not on file  Social Connections: Not on file    Allergies:  Allergies  Allergen Reactions   Cogentin [Benztropine] Other (See Comments)    Per MAR    Penicillins Other (See Comments)  Per MAR - unable to verify patients PCN reaction    Prolixin [Fluphenazine] Other (See Comments)    Per [facility]     Metabolic Disorder Labs: Lab Results  Component Value Date   HGBA1C 5.3 11/13/2020   MPG 105.41 11/13/2020   MPG 105 10/25/2020   Lab Results  Component Value Date   PROLACTIN 31.5 (H) 03/23/2016   PROLACTIN 36.6 (H) 03/20/2016   Lab Results  Component Value Date   CHOL 168 11/13/2020   TRIG 95 11/13/2020   HDL  50 11/13/2020   CHOLHDL 3.4 11/13/2020   VLDL 19 11/13/2020   LDLCALC 99 11/13/2020   LDLCALC 89 10/14/2020   Lab Results  Component Value Date   TSH 2.399 11/13/2020   TSH 0.888 02/10/2020    Therapeutic Level Labs: No results found for: LITHIUM Lab Results  Component Value Date   VALPROATE 57 11/13/2020   VALPROATE 29 (L) 10/15/2020   No components found for:  CBMZ  Current Medications: Current Outpatient Medications  Medication Sig Dispense Refill   albuterol (PROVENTIL HFA;VENTOLIN HFA) 108 (90 BASE) MCG/ACT inhaler Inhale 2 puffs into the lungs every 4 (four) hours as needed for wheezing or shortness of breath. 1 Inhaler 0   amantadine (SYMMETREL) 100 MG capsule Take 100 mg by mouth 2 (two) times daily.     amLODipine (NORVASC) 5 MG tablet Take 1 tablet (5 mg total) by mouth daily. 30 tablet 3   dapagliflozin propanediol (FARXIGA) 10 MG TABS tablet Take 10 mg by mouth daily after breakfast.     divalproex (DEPAKOTE) 250 MG DR tablet Take 1 tablet (250 mg total) by mouth 3 (three) times daily. 90 tablet 1   hydrOXYzine (ATARAX/VISTARIL) 50 MG tablet Take 1 tablet (50 mg total) by mouth 4 (four) times daily. 60 tablet 0   LANTUS SOLOSTAR 100 UNIT/ML Solostar Pen Inject 20 Units into the skin at bedtime.     lisinopril (ZESTRIL) 20 MG tablet Take 20 mg by mouth daily.     LORazepam (ATIVAN) 0.5 MG tablet Take 1 tablet (0.5 mg total) by mouth 2 (two) times daily. 10 tablet 0   magnesium oxide (MAG-OX) 400 (240 Mg) MG tablet Take 1 tablet (400 mg total) by mouth daily. 30 tablet 0   metFORMIN (GLUCOPHAGE) 1000 MG tablet Take 1,000 mg by mouth 2 (two) times daily with a meal.     QUEtiapine (SEROQUEL) 200 MG tablet Take 2 tablets (400 mg total) by mouth in the morning. 60 tablet 1   QUEtiapine (SEROQUEL) 50 MG tablet Take 1-2 tablets (50-100 mg total) by mouth 3 (three) times daily. 100 mg am, 50 mg noon and 100 mg hs 150 tablet 1   traZODone (DESYREL) 100 MG tablet Take 1 tablet  (100 mg total) by mouth at bedtime. 30 tablet 1   zolpidem (AMBIEN) 5 MG tablet Take 5 mg by mouth at bedtime as needed for sleep.     No current facility-administered medications for this visit.     Musculoskeletal: Strength & Muscle Tone: within normal limits Gait & Station: normal Patient leans: N/A  Psychiatric Specialty Exam: Review of Systems  Psychiatric/Behavioral:  Positive for sleep disturbance. Negative for decreased concentration, dysphoric mood, hallucinations, self-injury and suicidal ideas. The patient is not nervous/anxious and is not hyperactive.    Blood pressure 112/67, pulse 89, temperature 98.7 F (37.1 C), temperature source Oral, height 5' 6.54" (1.69 m), weight 155 lb 12.8 oz (70.7 kg), SpO2 100 %.Body mass index is  24.74 kg/m.  General Appearance: Fairly Groomed  Eye Contact:  Good  Speech:  Clear and Coherent and Normal Rate  Volume:  Normal  Mood:  Euthymic  Affect:  Appropriate  Thought Process:  Coherent and Descriptions of Associations: Intact  Orientation:  Full (Time, Place, and Person)  Thought Content: WDL   Suicidal Thoughts:  No  Homicidal Thoughts:  No  Memory:  Immediate;   Fair Recent;   Poor Remote;   Poor  Judgement:  Impaired  Insight:  Lacking  Psychomotor Activity:  Normal  Concentration:  Concentration: Fair and Attention Span: Fair  Recall:  [employer] of Knowledge: Poor  Language: Fair  Akathisia:  NA  Handed:  Right  AIMS (if indicated): not done  Assets:  Financial Resources/Insurance Housing Resilience  ADL's:  Impaired  Cognition: Impaired,  Moderate  Sleep:  Fair   Screenings: AIMS    Flowsheet Row [facility]  AIMS Total Score 0      AUDIT    Flowsheet Row [facility]  Alcohol Use Disorder Identification Test Final Score (AUDIT) 0      GAD-7     Flowsheet Row Office Visit from 03/31/2021 in [facility]  Total GAD-7 Score 7      PHQ2-9    Flowsheet Row Clinical Support from 04/29/2021 in [facility] Office Visit from 03/31/2021 in [facility] Office Visit from 11/18/2020 in [facility]  PHQ-2 Total Score 2 0 4  PHQ-9 Total Score 9 -- 6      Flowsheet Row Clinical Support from 04/29/2021 in [facility] Office Visit from 03/31/2021 in [facility] Office Visit from 11/18/2020 in [facility]  C-SSRS RISK CATEGORY No Risk No Risk No Risk        Assessment and Plan:   Ronald Roman is a 60 year old male with a past psychiatric history significant for, major depressive disorder, paranoid schizophrenia, and impaired cognitive ability who presents to [facility] follow-up and medication management.  Patient reports that he has been taking his medications as prescribed and has had no issues with his medications.  Following the conclusion of the encounter, provider was provided a list of the patient's medications he is currently taking at the group home.  Patient to continue taking his medications as prescribed.  1. Paranoid schizophrenia (HCC) Patient to continue taking Clozaril 50 mg at bedtime for the management of his schizophrenia Patient to continue taking Seroquel 100 mg at bedtime/25 mg 2 times daily for the management of schizophrenia Patient to continue taking Depakote 250 mg 3 times daily for the management of his schizophrenia  2. Insomnia due to other mental disorder Patient to continue taking trazodone 50 mg at bedtime for the management of his insomnia  3. Episode of recurrent major depressive disorder, unspecified depression episode severity (HCC) Patient to continue taking  Seroquel 100 mg at bedtime/25 mg 3 times daily for the management of his major depressive disorder  4. Impaired cognitive ability   Patient to follow up in 7 weeks Provider spent a total of 24 minutes with the patient/reviewing the patient's chart  Meta Hatchet, PA 04/29/2021, 10:56 PM

## 2021-04-30 ENCOUNTER — Telehealth (HOSPITAL_COMMUNITY): Payer: Medicaid Other | Admitting: Physician Assistant

## 2021-06-17 ENCOUNTER — Telehealth (HOSPITAL_COMMUNITY): Payer: Medicaid Other | Admitting: Physician Assistant

## 2021-07-22 ENCOUNTER — Ambulatory Visit: Payer: Medicaid Other | Admitting: Podiatry

## 2021-07-22 ENCOUNTER — Encounter: Payer: Self-pay | Admitting: Podiatry

## 2021-07-22 ENCOUNTER — Ambulatory Visit (INDEPENDENT_AMBULATORY_CARE_PROVIDER_SITE_OTHER): Payer: Medicaid Other | Admitting: Podiatry

## 2021-07-22 DIAGNOSIS — N179 Acute kidney failure, unspecified: Secondary | ICD-10-CM

## 2021-07-22 DIAGNOSIS — B351 Tinea unguium: Secondary | ICD-10-CM | POA: Diagnosis not present

## 2021-07-22 DIAGNOSIS — E119 Type 2 diabetes mellitus without complications: Secondary | ICD-10-CM

## 2021-07-22 DIAGNOSIS — M79674 Pain in right toe(s): Secondary | ICD-10-CM | POA: Diagnosis not present

## 2021-07-22 DIAGNOSIS — M79675 Pain in left toe(s): Secondary | ICD-10-CM | POA: Diagnosis not present

## 2021-07-22 NOTE — Progress Notes (Signed)
This patient returns to my office for at risk foot care.  This patient requires this care by a professional since this patient will be at risk due to having diabetes and kidney injury.  This patient is unable to cut nails himself since the patient cannot reach his nails.These nails are painful walking and wearing shoes.  Patient has not been seen in over 1 and a half years.  This patient presents for at risk foot care today. ? ?General Appearance  Alert, conversant and in no acute stress. ? ?Vascular  Dorsalis pedis and posterior tibial  pulses are palpable  bilaterally.  Capillary return is within normal limits  bilaterally. Temperature is within normal limits  bilaterally. ? ?Neurologic  Senn-Weinstein monofilament wire test within normal limits  bilaterally. Muscle power within normal limits bilaterally. ? ?Nails Thick disfigured discolored nails with subungual debris  1 right and 5 left.. No evidence of bacterial infection or drainage bilaterally. ? ?Orthopedic  No limitations of motion  feet .  No crepitus or effusions noted.  No bony pathology or digital deformities noted. ? ?Skin  normotropic skin with no porokeratosis noted bilaterally.  No signs of infections or ulcers noted.    ? ?Onychomycosis  Pain in right toes  Pain in left toes ? ?Consent was obtained for treatment procedures.   Mechanical debridement of nails 1-5  bilaterally performed with a nail nipper.  Filed with dremel without incident.  ? ? ?Return office visit  prn                    Told patient to return for periodic foot care and evaluation due to potential at risk complications. ? ? ?Helane Gunther DPM  ?

## 2021-07-23 ENCOUNTER — Emergency Department (HOSPITAL_COMMUNITY)
Admission: EM | Admit: 2021-07-23 | Discharge: 2021-07-23 | Disposition: A | Discharge status: Skilled Nursing Facility | Payer: Medicaid Other | Attending: Emergency Medicine | Admitting: Emergency Medicine

## 2021-07-23 ENCOUNTER — Emergency Department (HOSPITAL_COMMUNITY): Payer: Medicaid Other

## 2021-07-23 ENCOUNTER — Other Ambulatory Visit: Payer: Self-pay

## 2021-07-23 DIAGNOSIS — W19XXXA Unspecified fall, initial encounter: Secondary | ICD-10-CM | POA: Insufficient documentation

## 2021-07-23 DIAGNOSIS — S0093XA Contusion of unspecified part of head, initial encounter: Secondary | ICD-10-CM | POA: Diagnosis not present

## 2021-07-23 DIAGNOSIS — Z7984 Long term (current) use of oral hypoglycemic drugs: Secondary | ICD-10-CM | POA: Diagnosis not present

## 2021-07-23 DIAGNOSIS — Z79899 Other long term (current) drug therapy: Secondary | ICD-10-CM | POA: Insufficient documentation

## 2021-07-23 DIAGNOSIS — S0990XA Unspecified injury of head, initial encounter: Secondary | ICD-10-CM | POA: Diagnosis present

## 2021-07-23 NOTE — ED Provider Notes (Signed)
? ?[facility] EMERGENCY DEPARTMENT  ?Provider Note ? ?CSN: [id number] ?Arrival date & time: 07/23/21 1844 ? ?History ?Chief Complaint  ?Patient presents with  ? Fall  ? ? ?DASHUN BORRE is a 61 y.o. male who presents after a fall.  Patient has a history of cognitive delay.  He lives in a group home.  He is post walk with a walker but routinely does not.  He had multiple falls recently because he is not using his walker.  Today he had his head.  Did not blackout.  Staff sent him here for further evaluation.  Patient states he feels fine here in the emergency room.  He is without complaints or concerns. ? ? ?Home Medications ?Prior to Admission medications   ?Medication Sig Start Date End Date Taking? Authorizing Provider  ?albuterol (PROVENTIL HFA;VENTOLIN HFA) 108 (90 BASE) MCG/ACT inhaler Inhale 2 puffs into the lungs every 4 (four) hours as needed for wheezing or shortness of breath. 04/21/15   Hayden Rasmussen, NP  ?amantadine (SYMMETREL) 100 MG capsule Take 100 mg by mouth 2 (two) times daily.    [provider]  ?amLODipine (NORVASC) 5 MG tablet Take 1 tablet (5 mg total) by mouth daily. 11/14/20   Lauro Franklin, MD  ?dapagliflozin propanediol (FARXIGA) 10 MG TABS tablet Take 10 mg by mouth daily after breakfast.    [provider]  ?divalproex (DEPAKOTE) 250 MG DR tablet Take 1 tablet (250 mg total) by mouth 3 (three) times daily. 11/18/20 12/18/20  Meta Hatchet, PA  ?hydrOXYzine (ATARAX/VISTARIL) 50 MG tablet Take 1 tablet (50 mg total) by mouth 4 (four) times daily. 11/22/20   Oneta Rack, NP  ?LANTUS SOLOSTAR 100 UNIT/ML Solostar Pen Inject 20 Units into the skin at bedtime. 11/12/20   [provider]  ?lisinopril (ZESTRIL) 20 MG tablet Take 20 mg by mouth daily.    [provider]  ?LORazepam (ATIVAN) 0.5 MG tablet Take 1 tablet (0.5 mg total) by mouth 2 (two) times daily. 11/22/20 11/22/21  Oneta Rack, NP  ?magnesium oxide (MAG-OX) 400 (240 Mg)  MG tablet Take 1 tablet (400 mg total) by mouth daily. 10/14/20   Pokhrel, Rebekah Chesterfield, MD  ?metFORMIN (GLUCOPHAGE) 1000 MG tablet Take 1,000 mg by mouth 2 (two) times daily with a meal.    [provider]  ?QUEtiapine (SEROQUEL) 200 MG tablet Take 2 tablets (400 mg total) by mouth in the morning. 11/18/20   Meta Hatchet, PA  ?QUEtiapine (SEROQUEL) 50 MG tablet Take 1-2 tablets (50-100 mg total) by mouth 3 (three) times daily. 100 mg am, 50 mg noon and 100 mg hs 11/18/20   Nwoko, Stephens Shire E, PA  ?traZODone (DESYREL) 100 MG tablet Take 1 tablet (100 mg total) by mouth at bedtime. 11/18/20   Nwoko, Tommas Olp, PA  ?zolpidem (AMBIEN) 5 MG tablet Take 5 mg by mouth at bedtime as needed for sleep.    [provider]  ? ? ? ?Allergies    ?Cogentin [benztropine], Penicillins, and Prolixin [fluphenazine] ? ? ?Review of Systems   ?Review of Systems  ?Constitutional:  Negative for chills and fever.  ?HENT:  Negative for ear pain and sore throat.   ?Eyes:  Negative for pain and visual disturbance.  ?Respiratory:  Negative for cough and shortness of breath.   ?Cardiovascular:  Negative for chest pain and palpitations.  ?Gastrointestinal:  Negative for abdominal pain and vomiting.  ?Genitourinary:  Negative for dysuria and hematuria.  ?Musculoskeletal:  Negative for arthralgias and back pain.  ?Skin:  Negative for color change and rash.  ?Neurological:  Negative for seizures and syncope.  ?All other systems reviewed and are negative. ?Please see HPI for pertinent positives and negatives ? ?Physical Exam ?BP (!) 148/74   Pulse 88   Temp 98.7 ?F (37.1 ?C) (Oral)   Resp 16   Ht 5\' 6"  (1.676 m)   Wt 70 kg   SpO2 100%   BMI 24.91 kg/m?  ? ?Physical Exam ?Vitals and nursing note reviewed.  ?Constitutional:   ?   General: He is not in acute distress. ?   Appearance: He is well-developed.  ?HENT:  ?   Head:  ?   Comments: Small posterior scalp hematoma. ?Eyes:  ?   Conjunctiva/sclera: Conjunctivae normal.   ?Cardiovascular:  ?   Rate and Rhythm: Normal rate and regular rhythm.  ?   Heart sounds: No murmur heard. ?Pulmonary:  ?   Effort: Pulmonary effort is normal. No respiratory distress.  ?   Breath sounds: Normal breath sounds.  ?Abdominal:  ?   Palpations: Abdomen is soft.  ?   Tenderness: There is no abdominal tenderness.  ?Musculoskeletal:     ?   General: No swelling.  ?   Cervical back: Neck supple.  ?Skin: ?   General: Skin is warm and dry.  ?   Capillary Refill: Capillary refill takes less than 2 seconds.  ?Neurological:  ?   General: No focal deficit present.  ?   Mental Status: He is alert.  ?Psychiatric:     ?   Mood and Affect: Mood normal.  ? ? ?ED Results / Procedures / Treatments   ?EKG ?EKG Interpretation ? ?Date/Time:  Thursday July 23 2021 20:45:04 EST ?Ventricular Rate:  69 ?PR Interval:  167 ?QRS Duration: 89 ?QT Interval:  365 ?QTC Calculation: 391 ?R Axis:   90 ?Text Interpretation: Sinus rhythm Borderline right axis deviation Confirmed by Alvester Chou [phone]) on 07/23/2021 8:50:05 PM ? ?Procedures ?Procedures ? ?Medications Ordered in the ED ?Medications - No data to display ? ? ?ED Course  ? ?Clinical Course as of 07/24/21 1506  ?Thu Jul 23, 2021  ?[id number] 61 year old male with a history of frequent falls presenting from a care facility with another fall and head injury.  Patient is asymptomatic on my evaluation, denies headache, has a very small hematoma on the top of the head.  CT scan of the brain does not show intracranial bleeding.  Patient is repeatedly been advised to use a walker and a cane for balance due to frequent falls, has refused to do so in the past.  Once again he was encouraged to use this additional support for walking.  No other additional traumatic injuries noted on exam [MT]  ?2114 EKG per my interpretation shows a normal heart rate, no acute ischemic findings. [MT]  ?  ?Clinical Course User Index ?[MT] Terald Sleeper, MD  ? ? ? ?MDM  ? ?This patient presents to the ED  for concern of a fall, this involves an extensive number of treatment options, and is a complaint that carries with it a high risk of complications and morbidity.  The differential diagnosis includes traumatic injury. Patient?s presentation is complicated by their history of cognitive delay ? ? ?Additional history obtained: ?Additional history obtained from EMS  and nursing home/care facility ?Records reviewed previous admission documents, Care Everywhere/External Records, and Primary Care Documents ? ?Imaging Studies ordered: ?I ordered imaging studies including  CT scan head   ?I independently visualized and interpreted imaging which showed no acute findings ?I agree with the radiologist interpretation ? ?EKG (personally reviewed and interpreted): No STEMI.  No arrhythmia.  No signs of acute ischemia. ? ? ?Medical Decision Making: Patient presented from a group home.  He has a history of frequent falls secondary to forgetting to use his walker.  He is supposed uses walker but typically does not.  He has had countless recent falls at the facility.  Today he has had.  His CT scan here was unremarkable.  He is ambulatory here in the emergency room without difficulty.  His EKG was reassuring.  In talking to staff and the patient, it sounds that today was a mechanical fall.  Do not feel he requires a syncope evaluation at this time.  Patient safe for discharge back to his facility. ? ?Complexity of problems addressed: ?Patient?s presentation is most consistent with  acute presentation with potential threat to life or bodily function ? ?Disposition: ?After consideration of the diagnostic results and the patient?s response to treatment,  ?I feel that the patent would benefit from discharge home .  ? ?Patient seen in conjunction with my attending, Dr. Renaye Rakers. ? ? ? ?Final Clinical Impression(s) / ED Diagnoses ?Final diagnoses:  ?Fall, initial encounter  ?Contusion of head, unspecified part of head, initial encounter   ? ? ?Rx / DC Orders ?ED Discharge Orders   ? ? None  ? ?  ?  ?  ?Edison Simon, MD ?07/24/21 1506 ? ?  ?Terald Sleeper, MD ?07/24/21 1555 ? ?

## 2021-07-23 NOTE — ED Triage Notes (Addendum)
BIB [facility] for multiple falls today. No thinners. No CP No SOB.Reported that patient has fell twice today and once yesterday. No LOC, hit yesterday, no outside evidence of trauma to head, no pain to neck, no pain reported by PT. Pt comes from a group home for patient's with cognitive disabilities. HX of many falls in the past. Has a walker, but does not use it. ? ?HX ?Schizophrenia ?Cognitive impairment ?Depression ?HTN ?Diabetes ?CAD ?

## 2021-07-23 NOTE — ED Notes (Signed)
Report given to group home. [facility] arrives to transport patient. Ambulates to stretcher. Secured via rails x2 and straps x3. Papers sent with crew. Pt seen leaving ED with [facility] without incident. ?

## 2021-11-23 IMAGING — DX DG CHEST 1V PORT
1 series · 1 of 1 positions shown · non-contrast
Comparison: October 11, 2020

CLINICAL DATA: Fever

EXAM:
PORTABLE CHEST 1 VIEW

[chest ap]
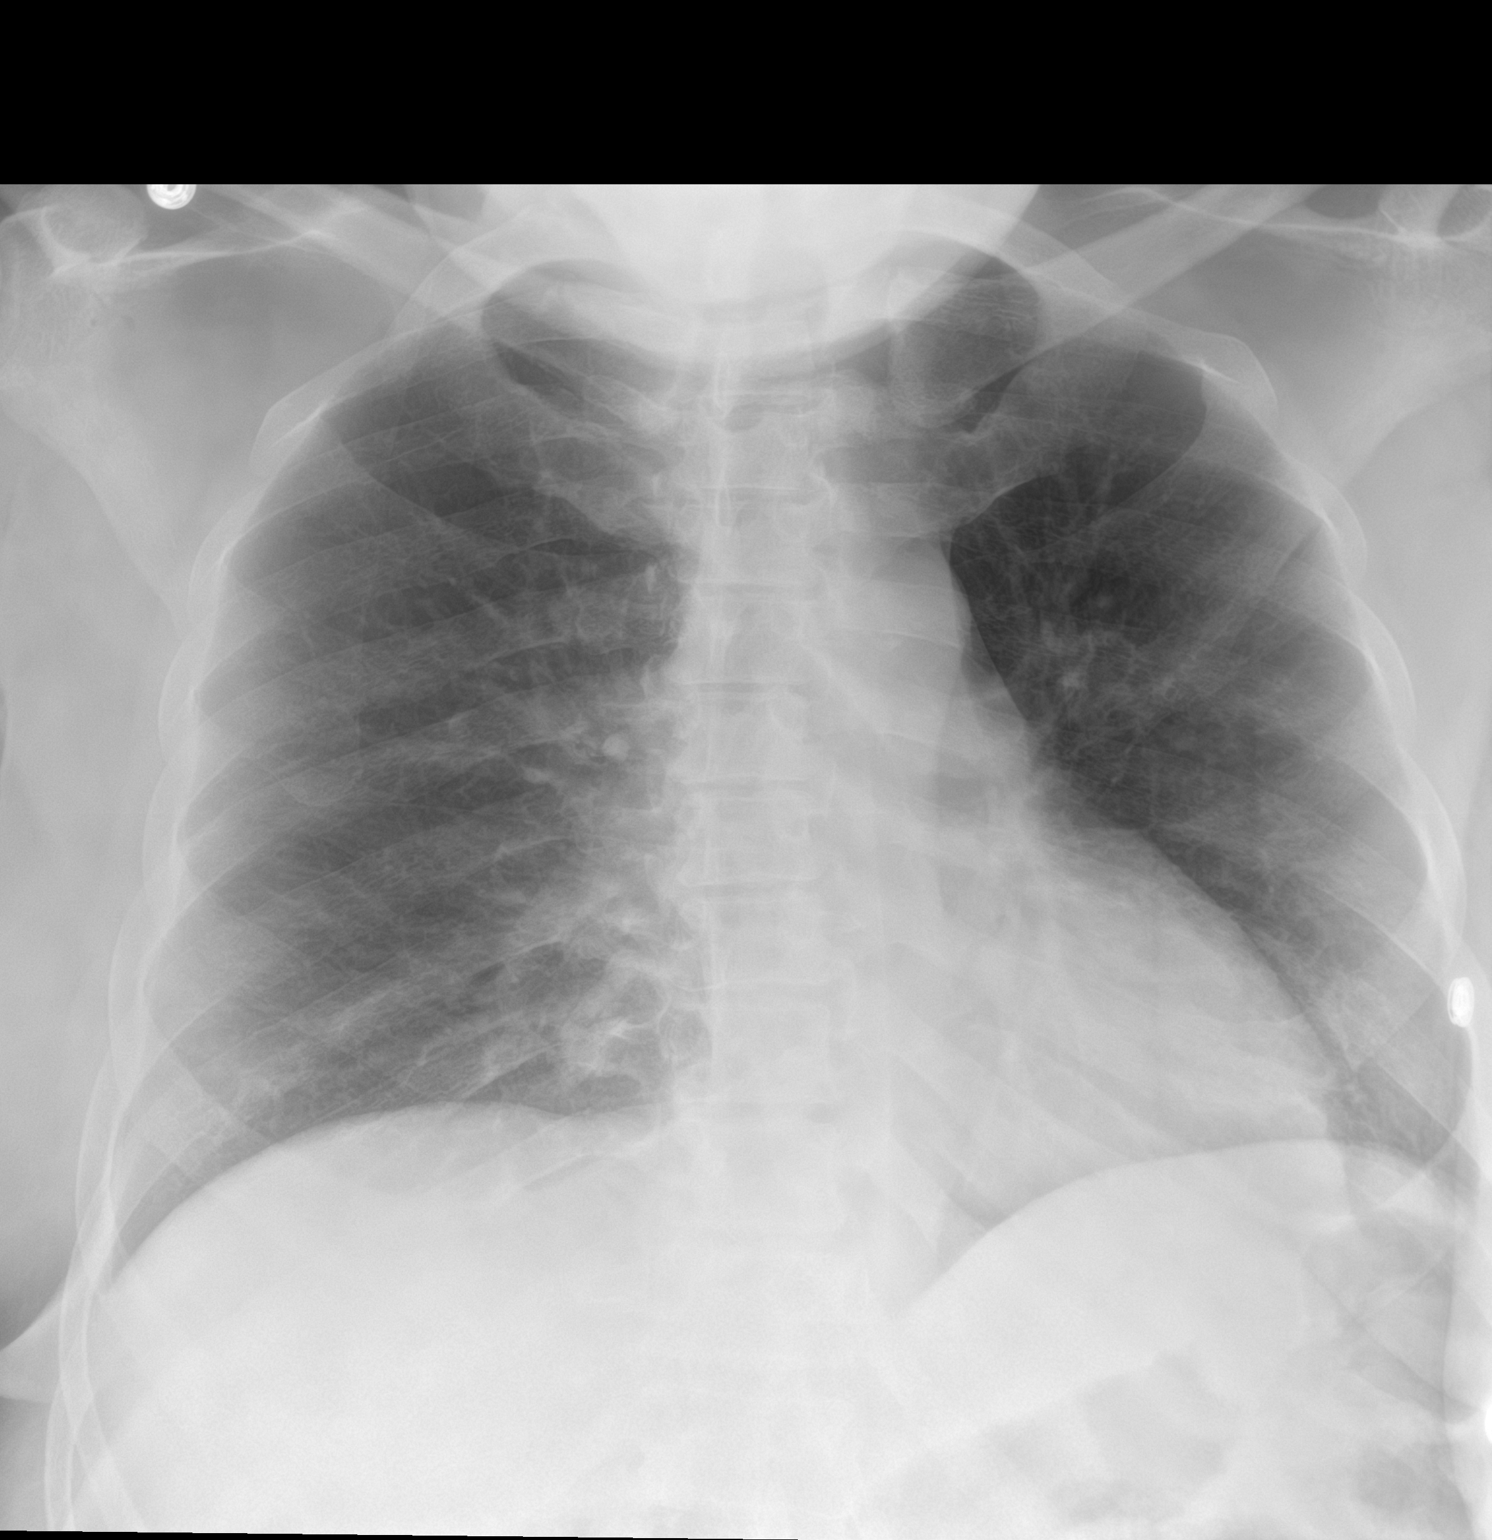

[1 of 1 positions shown; findings below may reference images not displayed]

FINDINGS: The lungs are clear. Heart size and pulmonary vascularity are
normal. No adenopathy. No bone lesions.
IMPRESSION: Lungs clear.  Cardiac silhouette within normal limits.

## 2021-11-25 ENCOUNTER — Emergency Department (HOSPITAL_COMMUNITY)
Admission: EM | Admit: 2021-11-25 | Discharge: 2021-11-26 | Disposition: A | Discharge status: Home / Self Care | Payer: Medicaid Other | Attending: Emergency Medicine | Admitting: Emergency Medicine

## 2021-11-25 ENCOUNTER — Emergency Department (HOSPITAL_COMMUNITY): Payer: Medicaid Other

## 2021-11-25 DIAGNOSIS — Z79899 Other long term (current) drug therapy: Secondary | ICD-10-CM | POA: Insufficient documentation

## 2021-11-25 DIAGNOSIS — D696 Thrombocytopenia, unspecified: Secondary | ICD-10-CM | POA: Insufficient documentation

## 2021-11-25 DIAGNOSIS — Z20822 Contact with and (suspected) exposure to covid-19: Secondary | ICD-10-CM | POA: Diagnosis not present

## 2021-11-25 DIAGNOSIS — E119 Type 2 diabetes mellitus without complications: Secondary | ICD-10-CM | POA: Insufficient documentation

## 2021-11-25 DIAGNOSIS — I10 Essential (primary) hypertension: Secondary | ICD-10-CM | POA: Insufficient documentation

## 2021-11-25 DIAGNOSIS — D649 Anemia, unspecified: Secondary | ICD-10-CM | POA: Insufficient documentation

## 2021-11-25 DIAGNOSIS — R531 Weakness: Secondary | ICD-10-CM

## 2021-11-25 DIAGNOSIS — Z7984 Long term (current) use of oral hypoglycemic drugs: Secondary | ICD-10-CM | POA: Diagnosis not present

## 2021-11-25 LAB — COMPREHENSIVE METABOLIC PANEL
ALT: 13 U/L (ref 0–44)
AST: 25 U/L (ref 15–41)
Albumin: 3.2 g/dL — ABNORMAL LOW (ref 3.5–5.0)
Alkaline Phosphatase: 67 U/L (ref 38–126)
Anion gap: 12 (ref 5–15)
BUN: 41 mg/dL — ABNORMAL HIGH (ref 6–20)
CO2: 20 mmol/L — ABNORMAL LOW (ref 22–32)
Calcium: 9.2 mg/dL (ref 8.9–10.3)
Chloride: 107 mmol/L (ref 98–111)
Creatinine, Ser: 1.99 mg/dL — ABNORMAL HIGH (ref 0.61–1.24)
GFR, Estimated: 38 mL/min — ABNORMAL LOW (ref >60)
Glucose, Bld: 100 mg/dL — ABNORMAL HIGH (ref 70–99)
Potassium: 5.4 mmol/L — ABNORMAL HIGH (ref 3.5–5.1)
Sodium: 139 mmol/L (ref 135–145)
Total Bilirubin: 0.2 mg/dL — ABNORMAL LOW (ref 0.3–1.2)
Total Protein: 5.7 g/dL — ABNORMAL LOW (ref 6.5–8.1)

## 2021-11-25 LAB — CBC WITH DIFFERENTIAL/PLATELET
Abs Immature Granulocytes: 0.02 10*3/uL (ref 0.00–0.07)
Basophils Absolute: 0 10*3/uL (ref 0.0–0.1)
Basophils Relative: 0 %
Eosinophils Absolute: 0.2 10*3/uL (ref 0.0–0.5)
Eosinophils Relative: 2 %
HCT: 33.9 % — ABNORMAL LOW (ref 39.0–52.0)
Hemoglobin: 10.6 g/dL — ABNORMAL LOW (ref 13.0–17.0)
Immature Granulocytes: 0 %
Lymphocytes Relative: 36 %
Lymphs Abs: 2.9 10*3/uL (ref 0.7–4.0)
MCH: 27 pg (ref 26.0–34.0)
MCHC: 31.3 g/dL (ref 30.0–36.0)
MCV: 86.3 fL (ref 80.0–100.0)
Monocytes Absolute: 0.9 10*3/uL (ref 0.1–1.0)
Monocytes Relative: 11 %
Neutro Abs: 4 10*3/uL (ref 1.7–7.7)
Neutrophils Relative %: 51 %
Platelets: 105 10*3/uL — ABNORMAL LOW (ref 150–400)
RBC: 3.93 MIL/uL — ABNORMAL LOW (ref 4.22–5.81)
RDW: 15.3 % (ref 11.5–15.5)
WBC: 7.9 10*3/uL (ref 4.0–10.5)
nRBC: 0 % (ref 0.0–0.2)

## 2021-11-25 LAB — RESP PANEL BY RT-PCR (FLU A&B, COVID) ARPGX2
Influenza A by PCR: NEGATIVE
Influenza B by PCR: NEGATIVE
SARS Coronavirus 2 by RT PCR: NEGATIVE

## 2021-11-25 LAB — LACTIC ACID, PLASMA
Lactic Acid, Venous: 2.4 mmol/L (ref 0.5–1.9)
Lactic Acid, Venous: 1.5 mmol/L (ref 0.5–1.9)

## 2021-11-25 LAB — POC OCCULT BLOOD, ED: Fecal Occult Bld: NEGATIVE

## 2021-11-25 MED ORDER — SODIUM CHLORIDE 0.9 % IV BOLUS
1000.0000 mL | Freq: Once | INTRAVENOUS | Status: AC
  Administered 2021-11-25: 1000 mL via INTRAVENOUS

## 2021-11-25 NOTE — ED Provider Notes (Signed)
[facility] EMERGENCY DEPARTMENT Provider Note   CSN: [id number] Arrival date & time: 11/25/21  1616     History  Chief Complaint  Patient presents with   Weakness    Ronald Roman is a 61 y.o. male.  Pt is a 61 yo male with a pmhx significant for dm, htn, hld, gerd, sleep apnea, paranoid schizophrenia. Pt presents to the ED today with hypotension and weakness.  Pt's bp at the group home was in the 90s.  EMS did give pt 300 cc of fluid.  Pt has no specific complaints.  No f/c.       Home Medications Prior to Admission medications   Medication Sig Start Date End Date Taking? Authorizing Provider  albuterol (PROVENTIL HFA;VENTOLIN HFA) 108 (90 BASE) MCG/ACT inhaler Inhale 2 puffs into the lungs every 4 (four) hours as needed for wheezing or shortness of breath. 04/21/15   Hayden Rasmussen, NP  amantadine (SYMMETREL) 100 MG capsule Take 100 mg by mouth 2 (two) times daily.    [provider]  amLODipine (NORVASC) 5 MG tablet Take 1 tablet (5 mg total) by mouth daily. 11/14/20   Lauro Franklin, MD  dapagliflozin propanediol (FARXIGA) 10 MG TABS tablet Take 10 mg by mouth daily after breakfast.    [provider]  divalproex (DEPAKOTE) 250 MG DR tablet Take 1 tablet (250 mg total) by mouth 3 (three) times daily. 11/18/20 12/18/20  Nwoko, Tommas Olp, PA  hydrOXYzine (ATARAX/VISTARIL) 50 MG tablet Take 1 tablet (50 mg total) by mouth 4 (four) times daily. 11/22/20   Oneta Rack, NP  LANTUS SOLOSTAR 100 UNIT/ML Solostar Pen Inject 20 Units into the skin at bedtime. 11/12/20   [provider]  lisinopril (ZESTRIL) 20 MG tablet Take 20 mg by mouth daily.    [provider]  magnesium oxide (MAG-OX) 400 (240 Mg) MG tablet Take 1 tablet (400 mg total) by mouth daily. 10/14/20   Pokhrel, Rebekah Chesterfield, MD  metFORMIN (GLUCOPHAGE) 1000 MG tablet Take 1,000 mg by mouth 2 (two) times daily with a meal.    [provider]  QUEtiapine  (SEROQUEL) 200 MG tablet Take 2 tablets (400 mg total) by mouth in the morning. 11/18/20   Nwoko, Tommas Olp, PA  QUEtiapine (SEROQUEL) 50 MG tablet Take 1-2 tablets (50-100 mg total) by mouth 3 (three) times daily. 100 mg am, 50 mg noon and 100 mg hs 11/18/20   Nwoko, Stephens Shire E, PA  traZODone (DESYREL) 100 MG tablet Take 1 tablet (100 mg total) by mouth at bedtime. 11/18/20   Nwoko, Tommas Olp, PA  zolpidem (AMBIEN) 5 MG tablet Take 5 mg by mouth at bedtime as needed for sleep.    [provider]      Allergies    Cogentin [benztropine], Penicillins, and Prolixin [fluphenazine]    Review of Systems   Review of Systems  Neurological:  Positive for weakness.  All other systems reviewed and are negative.   Physical Exam Updated Vital Signs BP (!) 141/117   Pulse 79   Temp 97.6 F (36.4 C) (Oral)   Resp 14   SpO2 100%  Physical Exam Vitals and nursing note reviewed.  Constitutional:      Appearance: Normal appearance.  HENT:     Head: Normocephalic and atraumatic.     Comments: Poor dentition    Right Ear: External ear normal.     Left Ear: External ear normal.     Nose: Nose normal.  Mouth/Throat:     Pharynx: Oropharynx is clear.  Eyes:     Extraocular Movements: Extraocular movements intact.     Conjunctiva/sclera: Conjunctivae normal.     Pupils: Pupils are equal, round, and reactive to light.  Cardiovascular:     Rate and Rhythm: Normal rate and regular rhythm.     Pulses: Normal pulses.     Heart sounds: Normal heart sounds.  Pulmonary:     Effort: Pulmonary effort is normal.     Breath sounds: Normal breath sounds.  Abdominal:     General: Abdomen is flat. Bowel sounds are normal.     Palpations: Abdomen is soft.  Genitourinary:    Rectum: Guaiac result negative.  Musculoskeletal:        General: Normal range of motion.     Cervical back: Normal range of motion and neck supple.     Comments: Yellow-brown colored stool  Skin:    General: Skin is warm.      Capillary Refill: Capillary refill takes less than 2 seconds.  Neurological:     General: No focal deficit present.     Mental Status: He is alert.  Psychiatric:        Mood and Affect: Mood normal.        Behavior: Behavior normal.     ED Results / Procedures / Treatments   Labs (all labs ordered are listed, but only abnormal results are displayed) Labs Reviewed  CBC WITH DIFFERENTIAL/PLATELET - Abnormal; Notable for the following components:      Result Value   RBC 3.93 (*)    Hemoglobin 10.6 (*)    HCT 33.9 (*)    Platelets 105 (*)    All other components within normal limits  COMPREHENSIVE METABOLIC PANEL - Abnormal; Notable for the following components:   Potassium 5.4 (*)    CO2 20 (*)    Glucose, Bld 100 (*)    BUN 41 (*)    Creatinine, Ser 1.99 (*)    Total Protein 5.7 (*)    Albumin 3.2 (*)    Total Bilirubin 0.2 (*)    GFR, Estimated 38 (*)    All other components within normal limits  LACTIC ACID, PLASMA - Abnormal; Notable for the following components:   Lactic Acid, Venous 2.4 (*)    All other components within normal limits  RESP PANEL BY RT-PCR (FLU A&B, COVID) ARPGX2  CULTURE, BLOOD (ROUTINE X 2)  CULTURE, BLOOD (ROUTINE X 2)  URINALYSIS, ROUTINE W REFLEX MICROSCOPIC  LACTIC ACID, PLASMA  POTASSIUM  VITAMIN B12  FOLATE  IRON AND TIBC  FERRITIN  RETICULOCYTES  POC OCCULT BLOOD, ED    EKG EKG Interpretation  Date/Time:  Wednesday November 25 2021 16:24:31 EDT Ventricular Rate:  80 PR Interval:  173 QRS Duration: 90 QT Interval:  368 QTC Calculation: 425 R Axis:   67 Text Interpretation: Sinus rhythm No significant change since last tracing Confirmed by Jacalyn Lefevre [phone]) on 11/25/2021 5:55:02 PM  Radiology DG Chest Portable 1 View  Result Date: 11/25/2021 CLINICAL DATA:  Altered mental status EXAM: PORTABLE CHEST 1 VIEW COMPARISON:  10/26/2020 FINDINGS: The heart size and mediastinal contours are within normal limits. Both lungs are  clear. The visualized skeletal structures are unremarkable. IMPRESSION: No active disease. Electronically Signed   By: Marlan Palau M.D.   On: 11/25/2021 17:04    Procedures Procedures    Medications Ordered in ED Medications  sodium chloride 0.9 % bolus 1,000 mL (0 mLs  Intravenous Stopped 11/25/21 1851)  sodium chloride 0.9 % bolus 1,000 mL (1,000 mLs Intravenous New Bag/Given 11/25/21 2216)    ED Course/ Medical Decision Making/ A&P                           Medical Decision Making Amount and/or Complexity of Data Reviewed Labs: ordered. Radiology: ordered.   This patient presents to the ED for concern of hypotension, this involves an extensive number of treatment options, and is a complaint that carries with it a high risk of complications and morbidity.  The differential diagnosis includes dehydration, infection, electrolyte abn   Co morbidities that complicate the patient evaluation  dm, htn, hld, gerd, sleep apnea, paranoid schizophrenia   Additional history obtained:  Additional history obtained from epic chart review External records from outside source obtained and reviewed including EMS report   Lab Tests:  I Ordered, and personally interpreted labs.  The pertinent results include:  hgb down 10.6 (last hgb 12.8 1 yr ago); plt 105 (last plt 293 1 yr ago); bun 41 and cr 1.99; covid neg;   Imaging Studies ordered:  I ordered imaging studies including cxr  I independently visualized and interpreted imaging which showed  IMPRESSION:  No active disease.   I agree with the radiologist interpretation   Cardiac Monitoring:  The patient was maintained on a cardiac monitor.  I personally viewed and interpreted the cardiac monitored which showed an underlying rhythm of: nsr   Medicines ordered and prescription drug management:  I ordered medication including ivfs  for dehydration  Reevaluation of the patient after these medicines showed that the patient  improved I have reviewed the patients home medicines and have made adjustments as needed   Critical Interventions:  ivfs   Problem List / ED Course:  Anemia:  no evidence of GI bleed.  MCV is also nl.  Anemia panel sent.  Pt can f/u as an outpatient. Hypotension:  bp improved after ivfs.  Pt is eating and drinking well.   Reevaluation:  After the interventions noted above, I reevaluated the patient and found that they have :improved   Social Determinants of Health:  Lives in a group home   Dispostion:  After consideration of the diagnostic results and the patients response to treatment, I feel that the patent would benefit from discharge with outpatient f/u.          Final Clinical Impression(s) / ED Diagnoses Final diagnoses:  Weakness  Anemia, unspecified type  Thrombocytopenia (HCC)    Rx / DC Orders ED Discharge Orders     None         Jacalyn Lefevre, MD 11/25/21 2315

## 2021-11-25 NOTE — ED Triage Notes (Signed)
Pt BIB [facility] from a group home c/o weakness that started 3 days ago. Pt has been having troubles getting up and around. Pt was hypotensive at the group home and with EMS in the 90s. Cbg 98. A&O X4. EKG unremarkable. 350 ml NS given by EMS.   104/66 BP after fluids.

## 2021-11-25 NOTE — Discharge Instructions (Addendum)
You need to get a CBC rechecked by your primary care doctor.

## 2021-11-26 LAB — IRON AND TIBC
Iron: 79 ug/dL (ref 45–182)
Saturation Ratios: 35 % (ref 17.9–39.5)
TIBC: 224 ug/dL — ABNORMAL LOW (ref 250–450)
UIBC: 145 ug/dL

## 2021-11-26 LAB — URINALYSIS, ROUTINE W REFLEX MICROSCOPIC
Bacteria, UA: NONE SEEN
Bilirubin Urine: NEGATIVE
Glucose, UA: >=500 mg/dL — AB
Ketones, ur: NEGATIVE mg/dL
Leukocytes,Ua: NEGATIVE
Nitrite: NEGATIVE
Protein, ur: NEGATIVE mg/dL
Specific Gravity, Urine: 1.006 (ref 1.005–1.030)
pH: 7 (ref 5.0–8.0)

## 2021-11-26 LAB — VITAMIN B12: Vitamin B-12: 337 pg/mL (ref 180–914)

## 2021-11-26 LAB — RETICULOCYTES
Immature Retic Fract: 7.5 % (ref 2.3–15.9)
RBC.: 4.07 MIL/uL — ABNORMAL LOW (ref 4.22–5.81)
Retic Count, Absolute: 56.6 10*3/uL (ref 19.0–186.0)
Retic Ct Pct: 1.4 % (ref 0.4–3.1)

## 2021-11-26 LAB — FOLATE: Folate: 16.9 ng/mL (ref >5.9)

## 2021-11-26 LAB — FERRITIN: Ferritin: 194 ng/mL (ref 24–336)

## 2021-11-26 NOTE — ED Notes (Signed)
Spoke to Mr. Ronald Roman at [phone]. 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 He also reports is was bought by [facility]. Mr. Ronald Roman says that this patient left [facility] over a year ago and has been at [address] with [facility] since then.

## 2021-11-26 NOTE — ED Provider Notes (Signed)
Care of the patient assumed at the change of shift. Patient with paranoid schizophrenia brought to the ED via EMS from group home where his BP was reportedly low. Vitals have been normal in the ED. Blood work was unremarkable aside from mild anemia. Awaiting UA and anticipated discharge back to group home.  Physical Exam  BP 130/82   Pulse 93   Temp 97.6 F (36.4 C) (Oral)   Resp 15   SpO2 97%   Physical Exam  Procedures  Procedures  ED Course / MDM   Clinical Course as of 11/26/21 0056  Thu Nov 26, 2021  0053 UA is negative.  [CS]  [id number] Patient resting comfortably in no distress and without complaint. Will return to group home.  [CS]    Clinical Course User Index [CS] Pollyann Savoy, MD   Medical Decision Making Amount and/or Complexity of Data Reviewed Labs: ordered. Radiology: ordered.          Pollyann Savoy, MD 11/26/21 [phone]

## 2021-11-26 NOTE — ED Notes (Signed)
[name] from group home called and stated she was coming to pick her up

## 2021-11-26 NOTE — ED Notes (Signed)
Spoke with Clare Charon [facility] provides following info for patient:  [facility]. [address] [place], [name] [phone]

## 2021-11-26 NOTE — ED Notes (Signed)
Caretaker here to take patient home

## 2021-11-26 NOTE — Progress Notes (Signed)
CSW contacted patients group home, [phone]. Waiting to hear back from Ms. Synetta Fail on the plan of discharge and who is picking patient up.

## 2021-11-26 NOTE — ED Notes (Signed)
Pt denies need to use restroom at this time.

## 2021-11-26 NOTE — ED Notes (Signed)
[facility] called at [phone] spoke to staff member who does not recognize this patient's name- denies that patient stays at this group home.

## 2021-11-26 NOTE — ED Notes (Signed)
This RN tried to contact [facility] at the number listed in previous note - no answer at this time

## 2021-11-30 LAB — CULTURE, BLOOD (ROUTINE X 2)
Culture: NO GROWTH
Culture: NO GROWTH

## 2022-01-22 ENCOUNTER — Ambulatory Visit: Payer: Medicaid Other | Admitting: Podiatry

## 2022-03-13 ENCOUNTER — Other Ambulatory Visit: Payer: Self-pay

## 2022-03-13 ENCOUNTER — Encounter (HOSPITAL_COMMUNITY): Payer: Self-pay

## 2022-03-13 ENCOUNTER — Emergency Department (HOSPITAL_COMMUNITY)
Admission: EM | Admit: 2022-03-13 | Discharge: 2022-03-14 | Disposition: A | Discharge status: Home / Self Care | Payer: Medicaid Other | Attending: Emergency Medicine | Admitting: Emergency Medicine

## 2022-03-13 DIAGNOSIS — Z794 Long term (current) use of insulin: Secondary | ICD-10-CM | POA: Insufficient documentation

## 2022-03-13 DIAGNOSIS — G40909 Epilepsy, unspecified, not intractable, without status epilepticus: Secondary | ICD-10-CM | POA: Insufficient documentation

## 2022-03-13 DIAGNOSIS — Z79899 Other long term (current) drug therapy: Secondary | ICD-10-CM | POA: Diagnosis not present

## 2022-03-13 DIAGNOSIS — I10 Essential (primary) hypertension: Secondary | ICD-10-CM | POA: Insufficient documentation

## 2022-03-13 DIAGNOSIS — E119 Type 2 diabetes mellitus without complications: Secondary | ICD-10-CM | POA: Diagnosis not present

## 2022-03-13 DIAGNOSIS — R569 Unspecified convulsions: Secondary | ICD-10-CM

## 2022-03-13 DIAGNOSIS — Z7984 Long term (current) use of oral hypoglycemic drugs: Secondary | ICD-10-CM | POA: Diagnosis not present

## 2022-03-13 LAB — URINALYSIS, ROUTINE W REFLEX MICROSCOPIC
Bacteria, UA: NONE SEEN
Bilirubin Urine: NEGATIVE
Glucose, UA: >=500 mg/dL — AB
Hgb urine dipstick: NEGATIVE
Ketones, ur: 5 mg/dL — AB
Leukocytes,Ua: NEGATIVE
Nitrite: NEGATIVE
Protein, ur: NEGATIVE mg/dL
Specific Gravity, Urine: 1.021 (ref 1.005–1.030)
pH: 7 (ref 5.0–8.0)

## 2022-03-13 LAB — VALPROIC ACID LEVEL: Valproic Acid Lvl: 67 ug/mL (ref 50.0–100.0)

## 2022-03-13 LAB — CBC WITH DIFFERENTIAL/PLATELET
Abs Immature Granulocytes: 0.03 10*3/uL (ref 0.00–0.07)
Basophils Absolute: 0 10*3/uL (ref 0.0–0.1)
Basophils Relative: 0 %
Eosinophils Absolute: 0.2 10*3/uL (ref 0.0–0.5)
Eosinophils Relative: 2 %
HCT: 39.8 % (ref 39.0–52.0)
Hemoglobin: 12.8 g/dL — ABNORMAL LOW (ref 13.0–17.0)
Immature Granulocytes: 0 %
Lymphocytes Relative: 16 %
Lymphs Abs: 1.7 10*3/uL (ref 0.7–4.0)
MCH: 27.8 pg (ref 26.0–34.0)
MCHC: 32.2 g/dL (ref 30.0–36.0)
MCV: 86.3 fL (ref 80.0–100.0)
Monocytes Absolute: 0.6 10*3/uL (ref 0.1–1.0)
Monocytes Relative: 6 %
Neutro Abs: 7.8 10*3/uL — ABNORMAL HIGH (ref 1.7–7.7)
Neutrophils Relative %: 76 %
Platelets: 173 10*3/uL (ref 150–400)
RBC: 4.61 MIL/uL (ref 4.22–5.81)
RDW: 15.1 % (ref 11.5–15.5)
WBC: 10.3 10*3/uL (ref 4.0–10.5)
nRBC: 0 % (ref 0.0–0.2)

## 2022-03-13 LAB — RAPID URINE DRUG SCREEN, HOSP PERFORMED
Amphetamines: NOT DETECTED
Barbiturates: NOT DETECTED
Benzodiazepines: NOT DETECTED
Cocaine: NOT DETECTED
Opiates: NOT DETECTED
Tetrahydrocannabinol: NOT DETECTED

## 2022-03-13 LAB — COMPREHENSIVE METABOLIC PANEL
ALT: 10 U/L (ref 0–44)
AST: 16 U/L (ref 15–41)
Albumin: 3.5 g/dL (ref 3.5–5.0)
Alkaline Phosphatase: 82 U/L (ref 38–126)
Anion gap: 12 (ref 5–15)
BUN: 26 mg/dL — ABNORMAL HIGH (ref 8–23)
CO2: 24 mmol/L (ref 22–32)
Calcium: 9.4 mg/dL (ref 8.9–10.3)
Chloride: 104 mmol/L (ref 98–111)
Creatinine, Ser: 1.26 mg/dL — ABNORMAL HIGH (ref 0.61–1.24)
GFR, Estimated: >60 mL/min (ref >60)
Glucose, Bld: 103 mg/dL — ABNORMAL HIGH (ref 70–99)
Potassium: 4.8 mmol/L (ref 3.5–5.1)
Sodium: 140 mmol/L (ref 135–145)
Total Bilirubin: 0.4 mg/dL (ref 0.3–1.2)
Total Protein: 6.7 g/dL (ref 6.5–8.1)

## 2022-03-13 LAB — ETHANOL: Alcohol, Ethyl (B): <10 mg/dL (ref <10)

## 2022-03-13 LAB — CBG MONITORING, ED: Glucose-Capillary: 106 mg/dL — ABNORMAL HIGH (ref 70–99)

## 2022-03-13 MED ORDER — DIVALPROEX SODIUM 250 MG PO DR TAB
250.0000 mg | DELAYED_RELEASE_TABLET | Freq: Once | ORAL | Status: AC
  Administered 2022-03-13: 250 mg via ORAL
  Filled 2022-03-13: qty 1

## 2022-03-13 NOTE — ED Notes (Signed)
Pt is till very confused and will sometimes try to get out of bed but can easily be directed back.

## 2022-03-13 NOTE — ED Triage Notes (Signed)
Patient arrived by [name] after reported seizure today that was witnessed by staff. Patient alert and oriented.

## 2022-03-13 NOTE — ED Provider Notes (Signed)
[facility] EMERGENCY DEPARTMENT Provider Note   CSN: [id number] Arrival date & time: 03/13/22  1350     History {Add pertinent medical, surgical, social history, OB history to HPI:1} No chief complaint on file.   Ronald Roman is a 61 y.o. male.  61 year old male with a history of seizures on Depakote who presents emergency department after witnessed seizure-like activity.  Unable to obtain additional history at this time.  Patient states that he had a fall today but cannot recall if the seizure caused the fall or if he seized after falling.  Patient with delayed responses and appears to be confused occasionally when asked questions.  States that he is not currently in any pain.  Believes that he has been taking his seizure medication regularly without any missed doses.  Denies any recent illnesses.  Denies any EtOH or other substance use.   Past Medical History:  Diagnosis Date   Diabetes mellitus without complication (HCC)    GERD (gastroesophageal reflux disease)    Hyperlipidemia    Hypertension    Paranoid schizophrenia (HCC)    Sleep apnea    Uric acid nephrolithiasis         Home Medications Prior to Admission medications   Medication Sig Start Date End Date Taking? Authorizing Provider  albuterol (PROVENTIL HFA;VENTOLIN HFA) 108 (90 BASE) MCG/ACT inhaler Inhale 2 puffs into the lungs every 4 (four) hours as needed for wheezing or shortness of breath. 04/21/15   Janne Napoleon, NP  amantadine (SYMMETREL) 100 MG capsule Take 100 mg by mouth 2 (two) times daily.    [provider]  amLODipine (NORVASC) 5 MG tablet Take 1 tablet (5 mg total) by mouth daily. 11/14/20   Briant Cedar, MD  dapagliflozin propanediol (FARXIGA) 10 MG TABS tablet Take 10 mg by mouth daily after breakfast.    [provider]  divalproex (DEPAKOTE) 250 MG DR tablet Take 1 tablet (250 mg total) by mouth 3 (three) times daily. 11/18/20 12/18/20  Nwoko,  Terese Door, PA  hydrOXYzine (ATARAX/VISTARIL) 50 MG tablet Take 1 tablet (50 mg total) by mouth 4 (four) times daily. 11/22/20   Derrill Center, NP  LANTUS SOLOSTAR 100 UNIT/ML Solostar Pen Inject 20 Units into the skin at bedtime. 11/12/20   [provider]  lisinopril (ZESTRIL) 20 MG tablet Take 20 mg by mouth daily.    [provider]  magnesium oxide (MAG-OX) 400 (240 Mg) MG tablet Take 1 tablet (400 mg total) by mouth daily. 10/14/20   Pokhrel, Corrie Mckusick, MD  metFORMIN (GLUCOPHAGE) 1000 MG tablet Take 1,000 mg by mouth 2 (two) times daily with a meal.    [provider]  QUEtiapine (SEROQUEL) 200 MG tablet Take 2 tablets (400 mg total) by mouth in the morning. 11/18/20   Nwoko, Terese Door, PA  QUEtiapine (SEROQUEL) 50 MG tablet Take 1-2 tablets (50-100 mg total) by mouth 3 (three) times daily. 100 mg am, 50 mg noon and 100 mg hs 11/18/20   Nwoko, Isidoro Donning E, PA  traZODone (DESYREL) 100 MG tablet Take 1 tablet (100 mg total) by mouth at bedtime. 11/18/20   Nwoko, Terese Door, PA  zolpidem (AMBIEN) 5 MG tablet Take 5 mg by mouth at bedtime as needed for sleep.    [provider]      Allergies    Cogentin [benztropine], Penicillins, and Prolixin [fluphenazine]    Review of Systems   Review of Systems  Physical Exam Updated Vital Signs BP Marland Kitchen)  141/96   Pulse (!) 105   Temp 98.2 F (36.8 C)   Resp 17   SpO2 100%  Physical Exam  ED Results / Procedures / Treatments   Labs (all labs ordered are listed, but only abnormal results are displayed) Labs Reviewed  COMPREHENSIVE METABOLIC PANEL  CBC WITH DIFFERENTIAL/PLATELET  ETHANOL  RAPID URINE DRUG SCREEN, HOSP PERFORMED  APTT  PROTIME-INR  URINALYSIS, ROUTINE W REFLEX MICROSCOPIC  CBG MONITORING, ED    EKG None  Radiology No results found.  Procedures Procedures  {Document cardiac monitor, telemetry assessment procedure when appropriate:1}  Medications Ordered in ED Medications - No data to  display  ED Course/ Medical Decision Making/ A&P                           Medical Decision Making Amount and/or Complexity of Data Reviewed Labs: ordered.   ***  {Document critical care time when appropriate:1} {Document review of labs and clinical decision tools ie heart score, Chads2Vasc2 etc:1}  {Document your independent review of radiology images, and any outside records:1} {Document your discussion with family members, caretakers, and with consultants:1} {Document social determinants of health affecting pt's care:1} {Document your decision making why or why not admission, treatments were needed:1} Final Clinical Impression(s) / ED Diagnoses Final diagnoses:  None    Rx / DC Orders ED Discharge Orders     None

## 2022-03-13 NOTE — ED Provider Triage Note (Signed)
Emergency Medicine Provider Triage Evaluation Note  HOSEA Roman , a 61 y.o. male  was evaluated in triage.  Pt complains of seizure.  Seizure reportedly witnessed by staff.  Patient was seen stat with generalized jerking type motion.  EMS was called and brought patient to the emergency department.  Prior history of seizure disorder.  No postictal phase noted.  No trauma reported.  Review of Systems  Positive: See abov Negative:   Physical Exam  BP (!) 137/107 (BP Location: Right Arm)   Pulse 99   Temp 98.3 F (36.8 C) (Oral)   Resp 18   SpO2 100%  Gen:   Awake, no distress   Resp:  Normal effort  MSK:   Moves extremities without difficulty  Other:  No obvious signs of trauma externally.  No tenderness to palpation.  Medical Decision Making  Medically screening exam initiated at 2:25 PM.  Appropriate orders placed.  Ronald Roman was informed that the remainder of the evaluation will be completed by another provider, this initial triage assessment does not replace that evaluation, and the importance of remaining in the ED until their evaluation is complete.     Ronald Roman, [place] 03/13/22 1426

## 2022-03-14 NOTE — Discharge Instructions (Signed)
Today you were seen in the emergency department for your seizures.    In the emergency department you had labs and were given medications to prevent seizures.    At home, please take your seizure medication.    Follow-up with your primary doctor in 2-3 days regarding your visit.  Schedule an appointment with neurology as soon as possible to discuss if your seizure medication needs to be changed.  Return immediately to the emergency department if you experience any of the following: seizures lasting >5 minutes, back to back seizures were you do not return to your normal self between them, severe headaches, or any other concerning symptoms.    Thank you for visiting our Emergency Department. It was a pleasure taking care of you today.

## 2022-03-14 NOTE — ED Notes (Addendum)
Per MD Sharlett Iles- Spoke to pt group home, [facility], they cannot pick pt up until later this AM

## 2022-03-14 NOTE — Progress Notes (Signed)
CSW spoke with [name] at [facility] who states she will arrive to pick patient up at 9:15am.  Madilyn Fireman, MSW, [place] [facility] [phone]

## 2022-03-14 NOTE — ED Notes (Signed)
This RN tried to contact [facility] and group home number, no answer - left a message with Rodena Piety

## 2022-03-17 ENCOUNTER — Emergency Department (HOSPITAL_COMMUNITY)
Admission: EM | Admit: 2022-03-17 | Discharge: 2022-03-17 | Disposition: A | Discharge status: Home / Self Care | Payer: Medicaid Other | Attending: Emergency Medicine | Admitting: Emergency Medicine

## 2022-03-17 ENCOUNTER — Encounter (HOSPITAL_COMMUNITY): Payer: Self-pay | Admitting: Emergency Medicine

## 2022-03-17 ENCOUNTER — Other Ambulatory Visit: Payer: Self-pay

## 2022-03-17 DIAGNOSIS — Z79899 Other long term (current) drug therapy: Secondary | ICD-10-CM | POA: Insufficient documentation

## 2022-03-17 DIAGNOSIS — Z7984 Long term (current) use of oral hypoglycemic drugs: Secondary | ICD-10-CM | POA: Insufficient documentation

## 2022-03-17 DIAGNOSIS — I10 Essential (primary) hypertension: Secondary | ICD-10-CM | POA: Diagnosis not present

## 2022-03-17 DIAGNOSIS — Z87898 Personal history of other specified conditions: Secondary | ICD-10-CM

## 2022-03-17 DIAGNOSIS — G40909 Epilepsy, unspecified, not intractable, without status epilepticus: Secondary | ICD-10-CM | POA: Diagnosis present

## 2022-03-17 DIAGNOSIS — R569 Unspecified convulsions: Secondary | ICD-10-CM

## 2022-03-17 HISTORY — DX: Unspecified convulsions: R56.9

## 2022-03-17 LAB — VALPROIC ACID LEVEL: Valproic Acid Lvl: 55 ug/mL (ref 50.0–100.0)

## 2022-03-17 LAB — CBC
HCT: 37.7 % — ABNORMAL LOW (ref 39.0–52.0)
Hemoglobin: 12.1 g/dL — ABNORMAL LOW (ref 13.0–17.0)
MCH: 27.3 pg (ref 26.0–34.0)
MCHC: 32.1 g/dL (ref 30.0–36.0)
MCV: 85.1 fL (ref 80.0–100.0)
Platelets: 269 10*3/uL (ref 150–400)
RBC: 4.43 MIL/uL (ref 4.22–5.81)
RDW: 14.9 % (ref 11.5–15.5)
WBC: 9.2 10*3/uL (ref 4.0–10.5)
nRBC: 0 % (ref 0.0–0.2)

## 2022-03-17 MED ORDER — DIVALPROEX SODIUM 250 MG PO DR TAB
250.0000 mg | DELAYED_RELEASE_TABLET | Freq: Once | ORAL | Status: AC
  Administered 2022-03-17: 250 mg via ORAL
  Filled 2022-03-17: qty 1

## 2022-03-17 NOTE — ED Notes (Signed)
Delay in triage due to patient in the restroom. Patient redirected from walking the hallway.

## 2022-03-17 NOTE — ED Triage Notes (Signed)
Patient presents post seizure. He had one witnessed procedure that lasted about 1 min today at his facility. They were unable to to give information about postictal period. Facility reports he can be combative at times.    HX: Seizures, hypertension, diabetes, schizophrenia    EMS vitals: 161 CBG 130/77 BP 80 HR 95% SPO2 on room air

## 2022-03-17 NOTE — ED Notes (Signed)
Pt ambulatory to bathroom w/o assist 

## 2022-03-17 NOTE — ED Provider Notes (Signed)
[facility]-EMERGENCY DEPT Provider Note   CSN: [id number] Arrival date & time: 03/17/22  1134     History  Chief Complaint  Patient presents with   Seizures    Ronald Roman is a 61 y.o. male.  Pt from ALF via EMS with possible sz/ hx same.  Pt noted with period of appearing shaky for ~ 1 minute. On EMS arrival pt alert, not seizing, and described as being c/w baseline. Pt limited historian, level 5 caveat. No report of change in meds. No report or fevers or recent trauma. No oral/tongue trauma reported. No incontinence reported.   The history is provided by the patient, medical records and the EMS personnel. The history is limited by the condition of the patient.  Seizures      Home Medications Prior to Admission medications   Medication Sig Start Date End Date Taking? Authorizing Provider  albuterol (PROVENTIL HFA;VENTOLIN HFA) 108 (90 BASE) MCG/ACT inhaler Inhale 2 puffs into the lungs every 4 (four) hours as needed for wheezing or shortness of breath. 04/21/15   Hayden Rasmussen, NP  amantadine (SYMMETREL) 100 MG capsule Take 100 mg by mouth 2 (two) times daily.    [provider]  amLODipine (NORVASC) 5 MG tablet Take 1 tablet (5 mg total) by mouth daily. 11/14/20   Lauro Franklin, MD  dapagliflozin propanediol (FARXIGA) 10 MG TABS tablet Take 10 mg by mouth daily after breakfast.    [provider]  divalproex (DEPAKOTE) 250 MG DR tablet Take 1 tablet (250 mg total) by mouth 3 (three) times daily. 11/18/20 12/18/20  Nwoko, Tommas Olp, PA  hydrOXYzine (ATARAX/VISTARIL) 50 MG tablet Take 1 tablet (50 mg total) by mouth 4 (four) times daily. 11/22/20   Oneta Rack, NP  LANTUS SOLOSTAR 100 UNIT/ML Solostar Pen Inject 20 Units into the skin at bedtime. 11/12/20   [provider]  lisinopril (ZESTRIL) 20 MG tablet Take 20 mg by mouth daily.    [provider]  magnesium oxide (MAG-OX) 400 (240 Mg) MG tablet Take 1 tablet  (400 mg total) by mouth daily. 10/14/20   Pokhrel, Rebekah Chesterfield, MD  metFORMIN (GLUCOPHAGE) 1000 MG tablet Take 1,000 mg by mouth 2 (two) times daily with a meal.    [provider]  QUEtiapine (SEROQUEL) 200 MG tablet Take 2 tablets (400 mg total) by mouth in the morning. 11/18/20   Nwoko, Tommas Olp, PA  QUEtiapine (SEROQUEL) 50 MG tablet Take 1-2 tablets (50-100 mg total) by mouth 3 (three) times daily. 100 mg am, 50 mg noon and 100 mg hs 11/18/20   Nwoko, Stephens Shire E, PA  traZODone (DESYREL) 100 MG tablet Take 1 tablet (100 mg total) by mouth at bedtime. 11/18/20   Nwoko, Tommas Olp, PA  zolpidem (AMBIEN) 5 MG tablet Take 5 mg by mouth at bedtime as needed for sleep.    [provider]      Allergies    Cogentin [benztropine], Penicillins, and Prolixin [fluphenazine]    Review of Systems   Review of Systems  Unable to perform ROS: Other  Neurological:  Positive for seizures.  Pt limited historian - level 5 caveat.  Physical Exam Updated Vital Signs BP 133/79   Pulse (!) 59   Temp (!) 97.4 F (36.3 C) (Oral)   Resp 15   SpO2 92%  Physical Exam Vitals and nursing note reviewed.  Constitutional:      Appearance: Normal appearance. He is well-developed.  HENT:  Head: Atraumatic.     Comments: No face, head, scalp trauma or sts noted.     Nose: Nose normal.     Mouth/Throat:     Mouth: Mucous membranes are moist.     Pharynx: Oropharynx is clear.     Comments: No oral/tongue trauma.  Eyes:     General: No scleral icterus.    Extraocular Movements: Extraocular movements intact.     Conjunctiva/sclera: Conjunctivae normal.     Pupils: Pupils are equal, round, and reactive to light.  Neck:     Vascular: No carotid bruit.     Trachea: No tracheal deviation.     Comments: No stiffness or rigidity.  Cardiovascular:     Rate and Rhythm: Normal rate and regular rhythm.     Pulses: Normal pulses.     Heart sounds: Normal heart sounds. No murmur heard.    No friction  rub. No gallop.  Pulmonary:     Effort: Pulmonary effort is normal. No accessory muscle usage or respiratory distress.     Breath sounds: Normal breath sounds.  Abdominal:     General: Bowel sounds are normal. There is no distension.     Palpations: Abdomen is soft.     Tenderness: There is no abdominal tenderness.  Genitourinary:    Comments: No cva tenderness. Musculoskeletal:        General: No swelling or tenderness.     Cervical back: Normal range of motion and neck supple. No rigidity or tenderness.     Right lower leg: No edema.     Left lower leg: No edema.  Skin:    General: Skin is warm and dry.     Findings: No rash.  Neurological:     Mental Status: He is alert.     Comments: Alert, speech clear. Moves bil extremities purposefully with good strength.   Psychiatric:        Mood and Affect: Mood normal.     ED Results / Procedures / Treatments   Labs (all labs ordered are listed, but only abnormal results are displayed) Results for orders placed or performed during the hospital encounter of 03/17/22  CBC  Result Value Ref Range   WBC 9.2 4.0 - 10.5 K/uL   RBC 4.43 4.22 - 5.81 MIL/uL   Hemoglobin 12.1 (L) 13.0 - 17.0 g/dL   HCT 62.7 (L) 03.5 - 00.9 %   MCV 85.1 80.0 - 100.0 fL   MCH 27.3 26.0 - 34.0 pg   MCHC 32.1 30.0 - 36.0 g/dL   RDW 38.1 82.9 - 93.7 %   Platelets 269 150 - 400 K/uL   nRBC 0.0 0.0 - 0.2 %  Valproic acid level  Result Value Ref Range   Valproic Acid Lvl 55 50.0 - 100.0 ug/mL     EKG None  Radiology No results found.  Procedures Procedures    Medications Ordered in ED Medications - No data to display  ED Course/ Medical Decision Making/ A&P                           Medical Decision Making Problems Addressed: Essential hypertension: chronic illness or injury that poses a threat to life or bodily functions History of seizures: chronic illness or injury that poses a threat to life or bodily functions Seizure-like activity  [place]): acute illness or injury with systemic symptoms that poses a threat to life or bodily functions  Amount and/or Complexity of  Data Reviewed Independent Historian: EMS    Details: hx External Data Reviewed: notes. Labs: ordered. Decision-making details documented in ED Course.  Risk Prescription drug management. Decision regarding hospitalization.   Iv ns. Continuous pulse ox and cardiac monitoring. Labs ordered/sent.   Diff dx includes seizures, non-epileptic shaking episode, low depakote level, etc - dispo decision including potential need for admission considered if status/recurrent seizures -  will get labs and reassess.   Reviewed nursing notes and prior charts for additional history. External reports reviewed. Additional history from: EMS.  Cardiac monitor: sinus rhythm, rate 66.  Labs reviewed/interpreted by me - wbc normal. Depakote 55.  Chem sample not able to be processed - check of recent chem from 4 days ago unremarkable, therefore will not put pt through re-stick.  Recent UA neg for uti.   Po fluids/food.   Pt is afebrile, vitals normal. No apparent pain or discomfort. During ~ 3 hours period of observation in ED, no seizure activity while observed in ED.  Pt remains alert, content. Tolerates po.   Pt currently appears stable for d/c.   Rec close pcp/neurology follow up.  Return precautions provided.           Final Clinical Impression(s) / ED Diagnoses Final diagnoses:  None    Rx / DC Orders ED Discharge Orders     None         Lajean Saver, MD 03/17/22 1434

## 2022-03-17 NOTE — ED Notes (Signed)
Pt wandering away from bed. Pt requires multiple verbal cues for redirection. Pt initially refused to return to bed, but agreed to return for a sandwich. Pt given a sandwich and sitting EOB. Will continue to monitor.

## 2022-03-17 NOTE — Discharge Instructions (Addendum)
It was our pleasure to provide your ER care today - we hope that you feel better.  Continue your meds. Follow up closely with neurologist in the coming week.  See seizure instructions/precautions. Also follow up closely with your primary care doctor.   Return to ER if worse, new symptoms, persistent/recurrent seizures, fevers, new/severe pain,  trouble breathing, or other concern.

## 2022-03-19 ENCOUNTER — Ambulatory Visit (HOSPITAL_COMMUNITY)
Admission: EM | Admit: 2022-03-19 | Discharge: 2022-03-19 | Disposition: A | Discharge status: Home / Self Care | Payer: Medicaid Other | Attending: Psychiatry | Admitting: Psychiatry

## 2022-03-19 DIAGNOSIS — Z046 Encounter for general psychiatric examination, requested by authority: Secondary | ICD-10-CM

## 2022-03-19 DIAGNOSIS — F209 Schizophrenia, unspecified: Secondary | ICD-10-CM

## 2022-03-19 DIAGNOSIS — R4689 Other symptoms and signs involving appearance and behavior: Secondary | ICD-10-CM
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
## 2022-03-19 NOTE — Discharge Instructions (Signed)

## 2022-03-19 NOTE — Progress Notes (Signed)
[facility] entered resources into AVS  Sondos Wolfman [facility] 
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
## 2022-04-03 ENCOUNTER — Ambulatory Visit (HOSPITAL_COMMUNITY)
Admission: EM | Admit: 2022-04-03 | Discharge: 2022-04-03 | Disposition: A | Discharge status: Home / Self Care | Payer: Medicaid Other

## 2022-04-03 DIAGNOSIS — F69 Unspecified disorder of adult personality and behavior: Secondary | ICD-10-CM
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
## 2022-04-03 NOTE — ED Notes (Signed)
Patient was discharged by to his group home by the provider. Patient was escorted and transported to home by [employer]. Patient was given his AVS by provider.

## 2022-04-03 NOTE — Discharge Summary (Signed)
Ronald Roman to be D/C'd Home per NP order. An After Visit Summary was printed and given to the patient by provider. Patient escorted out, and D/C home via [employer]. [name]  04/03/2022 1:53 PM

## 2022-04-03 NOTE — Discharge Instructions (Signed)

## 2022-04-07 ENCOUNTER — Ambulatory Visit (HOSPITAL_COMMUNITY)
Admission: AD | Admit: 2022-04-07 | Discharge: 2022-04-07 | Disposition: A | Discharge status: Home / Self Care | Payer: Medicaid Other | Attending: Psychiatry | Admitting: Psychiatry

## 2022-04-07 ENCOUNTER — Ambulatory Visit (HOSPITAL_COMMUNITY)
Admission: EM | Admit: 2022-04-07 | Discharge: 2022-04-07 | Disposition: A | Discharge status: Home / Self Care | Payer: Medicaid Other | Attending: Psychiatry | Admitting: Psychiatry

## 2022-04-07 ENCOUNTER — Encounter (HOSPITAL_COMMUNITY): Payer: Self-pay | Admitting: Emergency Medicine

## 2022-04-07 ENCOUNTER — Emergency Department (HOSPITAL_COMMUNITY)
Admission: EM | Admit: 2022-04-07 | Discharge: 2022-04-07 | Disposition: A | Discharge status: Home / Self Care | Payer: Medicaid Other | Attending: Emergency Medicine | Admitting: Emergency Medicine

## 2022-04-07 ENCOUNTER — Other Ambulatory Visit: Payer: Self-pay

## 2022-04-07 DIAGNOSIS — I10 Essential (primary) hypertension: Secondary | ICD-10-CM | POA: Insufficient documentation

## 2022-04-07 DIAGNOSIS — R443 Hallucinations, unspecified: Secondary | ICD-10-CM | POA: Insufficient documentation

## 2022-04-07 DIAGNOSIS — Z794 Long term (current) use of insulin: Secondary | ICD-10-CM | POA: Diagnosis not present

## 2022-04-07 DIAGNOSIS — Z7984 Long term (current) use of oral hypoglycemic drugs: Secondary | ICD-10-CM | POA: Insufficient documentation

## 2022-04-07 DIAGNOSIS — F989 Unspecified behavioral and emotional disorders with onset usually occurring in childhood and adolescence: Secondary | ICD-10-CM | POA: Insufficient documentation

## 2022-04-07 DIAGNOSIS — F200 Paranoid schizophrenia: Secondary | ICD-10-CM | POA: Insufficient documentation

## 2022-04-07 DIAGNOSIS — R4189 Other symptoms and signs involving cognitive functions and awareness: Secondary | ICD-10-CM

## 2022-04-07 DIAGNOSIS — Z79899 Other long term (current) drug therapy: Secondary | ICD-10-CM | POA: Diagnosis not present

## 2022-04-07 DIAGNOSIS — E119 Type 2 diabetes mellitus without complications: Secondary | ICD-10-CM | POA: Insufficient documentation

## 2022-04-07 DIAGNOSIS — R4689 Other symptoms and signs involving appearance and behavior: Secondary | ICD-10-CM

## 2022-04-07 LAB — CBC WITH DIFFERENTIAL/PLATELET
Abs Immature Granulocytes: 0.02 10*3/uL (ref 0.00–0.07)
Basophils Absolute: 0.1 10*3/uL (ref 0.0–0.1)
Basophils Relative: 1 %
Eosinophils Absolute: 0.3 10*3/uL (ref 0.0–0.5)
Eosinophils Relative: 3 %
HCT: 37 % — ABNORMAL LOW (ref 39.0–52.0)
Hemoglobin: 12 g/dL — ABNORMAL LOW (ref 13.0–17.0)
Immature Granulocytes: 0 %
Lymphocytes Relative: 34 %
Lymphs Abs: 3 10*3/uL (ref 0.7–4.0)
MCH: 27.3 pg (ref 26.0–34.0)
MCHC: 32.4 g/dL (ref 30.0–36.0)
MCV: 84.3 fL (ref 80.0–100.0)
Monocytes Absolute: 0.9 10*3/uL (ref 0.1–1.0)
Monocytes Relative: 10 %
Neutro Abs: 4.7 10*3/uL (ref 1.7–7.7)
Neutrophils Relative %: 52 %
Platelets: 256 10*3/uL (ref 150–400)
RBC: 4.39 MIL/uL (ref 4.22–5.81)
RDW: 14.6 % (ref 11.5–15.5)
WBC: 8.8 10*3/uL (ref 4.0–10.5)
nRBC: 0 % (ref 0.0–0.2)

## 2022-04-07 LAB — COMPREHENSIVE METABOLIC PANEL
ALT: 12 U/L (ref 0–44)
AST: 20 U/L (ref 15–41)
Albumin: 3.9 g/dL (ref 3.5–5.0)
Alkaline Phosphatase: 94 U/L (ref 38–126)
Anion gap: 6 (ref 5–15)
BUN: 34 mg/dL — ABNORMAL HIGH (ref 8–23)
CO2: 23 mmol/L (ref 22–32)
Calcium: 9.1 mg/dL (ref 8.9–10.3)
Chloride: 107 mmol/L (ref 98–111)
Creatinine, Ser: 1.59 mg/dL — ABNORMAL HIGH (ref 0.61–1.24)
GFR, Estimated: 49 mL/min — ABNORMAL LOW (ref >60)
Glucose, Bld: 76 mg/dL (ref 70–99)
Potassium: 4.5 mmol/L (ref 3.5–5.1)
Sodium: 136 mmol/L (ref 135–145)
Total Bilirubin: 0.6 mg/dL (ref 0.3–1.2)
Total Protein: 7.7 g/dL (ref 6.5–8.1)

## 2022-04-07 LAB — RAPID URINE DRUG SCREEN, HOSP PERFORMED
Amphetamines: NOT DETECTED
Barbiturates: NOT DETECTED
Benzodiazepines: NOT DETECTED
Cocaine: NOT DETECTED
Opiates: NOT DETECTED
Tetrahydrocannabinol: NOT DETECTED

## 2022-04-07 LAB — ETHANOL: Alcohol, Ethyl (B): <10 mg/dL (ref <10)

## 2022-04-07 LAB — VALPROIC ACID LEVEL: Valproic Acid Lvl: 58 ug/mL (ref 50.0–100.0)

## 2022-04-07 MED ORDER — DIVALPROEX SODIUM 250 MG PO DR TAB
250.0000 mg | DELAYED_RELEASE_TABLET | Freq: Three times a day (TID) | ORAL | Status: DC
  Administered 2022-04-07: 250 mg via ORAL
  Filled 2022-04-07: qty 1

## 2022-04-07 MED ORDER — LISINOPRIL 10 MG PO TABS: 20.0000 mg | ORAL_TABLET | Freq: Every day | ORAL | Status: DC

## 2022-04-07 MED ORDER — QUETIAPINE FUMARATE 100 MG PO TABS
100.0000 mg | ORAL_TABLET | Freq: Two times a day (BID) | ORAL | Status: DC
  Administered 2022-04-07: 100 mg via ORAL
  Filled 2022-04-07: qty 1

## 2022-04-07 MED ORDER — QUETIAPINE FUMARATE 50 MG PO TABS: 50.0000 mg | ORAL_TABLET | Freq: Every day | ORAL | Status: DC

## 2022-04-07 MED ORDER — ALBUTEROL SULFATE HFA 108 (90 BASE) MCG/ACT IN AERS: 2.0000 | INHALATION_SPRAY | RESPIRATORY_TRACT | Status: DC | PRN

## 2022-04-07 MED ORDER — AMLODIPINE BESYLATE 5 MG PO TABS: 5.0000 mg | ORAL_TABLET | Freq: Every day | ORAL | Status: DC

## 2022-04-07 MED ORDER — QUETIAPINE FUMARATE 50 MG PO TABS: 50.0000 mg | ORAL_TABLET | Freq: Three times a day (TID) | ORAL | Status: DC

## 2022-04-07 MED ORDER — AMANTADINE HCL 100 MG PO CAPS
100.0000 mg | ORAL_CAPSULE | Freq: Two times a day (BID) | ORAL | Status: DC
  Administered 2022-04-07: 100 mg via ORAL
  Filled 2022-04-07: qty 1

## 2022-04-07 MED ORDER — TRAZODONE HCL 100 MG PO TABS
100.0000 mg | ORAL_TABLET | Freq: Every day | ORAL | Status: DC
  Administered 2022-04-07: 100 mg via ORAL
  Filled 2022-04-07: qty 1
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
## 2022-04-07 NOTE — ED Provider Notes (Signed)
Behavioral Health Urgent Care Medical Screening Exam  Patient Name: Ronald Roman MRN: [id number] Date of Evaluation: 04/07/22 Chief Complaint:   Diagnosis:  Final diagnoses:  Paranoid schizophrenia (HCC)  Impaired cognitive ability    History of Present illness: Ronald Roman is a 61 y.o. male.  Patient presents voluntarily to [facility] behavioral health for walk-in assessment.  He is accompanied by family care home administrator, Ronald Roman.  Patient prefers that caregiver remain present during assessment.  Aldwin is seated in assessment area, no apparent distress.  He is alert and oriented to person, place and time.  He is partially oriented to situation.  He is pleasant and cooperative during assessment.  He presents with euthymic mood, congruent affect.  Patient's history includes paranoid schizophrenia, aggressive behavior, impaired cognitive ability, major depressive disorder and insomnia due to other mental disorder.  He is followed by outpatient psychiatry through [facility].  [facility] provider visits patient at his care facility monthly.  Provider to visit Ronald Roman at facility later this week.  He is compliant with medications including Depakote, Cogentin, Haldol, clozapine and quetiapine.  Medications administered by care home staff.  Patient is uncertain why caregiver encouraged him to seek assessment today.  He states "nothing is wrong with me."  Jadiel denies suicidal and homicidal ideation. He denies history of non suicidal self-harm behavior.   He easily contracts verbally for safety with this Clinical research associate.  He denies both auditory and visual hallucinations.Patient reports "I have to go to work tomorrow, then I want to go home to [employer]."  Patient resides in [place] in [facility]. He attends [employer] program, attended today, refused to attend yesterday. He endorses average appetite, reports decreased sleep. Denies alcohol and substance use.     Patient offered support and encouragement.   [facility] administrator, Ronald Roman, would like for patient to be admitted "for 2 or 3 weeks."  She reports patient has exhibited behaviors for 2-3 months, including believing the items in the home belong to him that actually belong to other residents.  Reporting he would like to return home to [facility].  Reporting that he believes his mother is coming to pick him up, often opening the door and looking to see if his mother has arrived at the facility.  Caregiver reports patient often walks out of the home, this disturbs other residents in the home.  Caregiver reports patient is verbally redirectable, she is concerned that staff may not see patient attempt to leave the home.  She has placed an alarm on the doors so that staff can be notified if patient attempts to leave home.  Caregiver reports patient may no longer be a good fit for her facility.  She states "he needs to go into a locked building." Reviewed criteria for inpatient psychiatric hospitalization. Patient's caregiver does not agree with disposition and would prefer patient be "placed in a locked facility."  Per caregiver Ronald Roman has not been assigned legal guardian. [facility] DSS currently in process of assigning legal guardian for this patient. Per caregiver, a hearing is scheduled for later this month to assign legal guardian.    Patient and caregiver are educated and verbalize understanding of mental health resources and other crisis services in the community. They are instructed to call 911 and present to the nearest emergency room should patient experience any suicidal/homicidal ideation, auditory/visual/hallucinations, or detrimental worsening of mental health condition.     Flowsheet Row ED from  04/03/2022 in [facility] ED from 03/19/2022 in [facility] ED from 03/17/2022 in [facility]  HOSPITAL-EMERGENCY DEPT  C-SSRS RISK CATEGORY Error: Q3, 4, or 5 should not be populated when Q2 is No Error: Q3, 4, or 5 should not be populated when Q2 is No Error: Q3, 4, or 5 should not be populated when Q2 is No       Psychiatric Specialty Exam  Presentation  General Appearance:Appropriate for Environment; Casual  Eye Contact:Fair  Speech:Clear and Coherent; Normal Rate  Speech Volume:Normal  Handedness:Right   Mood and Affect  Mood: Euthymic  Affect: Congruent   Thought Process  Thought Processes: Coherent; Linear  Descriptions of Associations:Intact  Orientation:Partial (oriented to time, place, and person)  Thought Content:Logical  Diagnosis of Schizophrenia or Schizoaffective disorder in past: No data recorded Duration of Psychotic Symptoms: No data recorded Hallucinations:None  Ideas of Reference:None  Suicidal Thoughts:No  Homicidal Thoughts:No   Sensorium  Memory: Immediate Fair  Judgment: Intact  Insight: Present   Executive Functions  Concentration: Fair  Attention Span: Fair  Recall: [employer] of Knowledge: Fair  Language: Fair   Psychomotor Activity  Psychomotor Activity: Normal   Assets  Assets: Housing; Leisure Time; Physical Health; Resilience; Social Support   Sleep  Sleep: Poor  Number of hours: No data recorded  No data recorded  Physical Exam: Physical Exam Vitals and nursing note reviewed.  Constitutional:      Appearance: Normal appearance. He is well-developed.  HENT:     Head: Normocephalic and atraumatic.     Nose: Nose normal.  Cardiovascular:     Rate and Rhythm: Normal rate.  Pulmonary:     Effort: Pulmonary effort is normal.  Musculoskeletal:        General: Normal range of motion.     Cervical back: Normal range of motion.  Skin:    General: Skin is warm and dry.  Neurological:     Mental Status: He is alert and oriented to person, place, and time.  Psychiatric:         Attention and Perception: Attention normal.        Mood and Affect: Mood and affect normal.        Speech: Speech normal.        Behavior: Behavior is cooperative.    Review of Systems  Constitutional: Negative.   HENT: Negative.    Eyes: Negative.   Respiratory: Negative.    Cardiovascular: Negative.   Gastrointestinal: Negative.   Genitourinary: Negative.   Musculoskeletal: Negative.   Skin: Negative.   Neurological: Negative.   Psychiatric/Behavioral:  The patient has insomnia.    Blood pressure 102/62, pulse 88, temperature 98.4 F (36.9 C), temperature source Oral, resp. rate 18, SpO2 100 %. There is no height or weight on file to calculate BMI.  Musculoskeletal: Strength & Muscle Tone: within normal limits Gait & Station: normal Patient leans: N/A   [facility] MSE Discharge Disposition for Follow up and Recommendations: Based on my evaluation the patient does not appear to have an emergency medical condition and can be discharged with resources and follow up care in outpatient services for Medication Management and Individual Therapy Follow up with established outpatient psychiatry, continue current medications. Follow up with medicaid care coordinator. Follow up with primary care provider.    Lenard Lance, FNP 04/07/2022, 5:32 PM

## 2022-04-07 NOTE — Discharge Instructions (Addendum)
Discuss home medications with PCP and outpatient psychiatric providers for possible dose changes that may improve recent behavioral concerns.  Discuss testing for dementia with PCP.  Return to the emergency department for any new or worsening symptoms of concern.

## 2022-04-07 NOTE — ED Provider Notes (Addendum)
[facility] DEPT Provider Note   CSN: [id number] Arrival date & time: 04/07/22  1844     History  Chief Complaint  Patient presents with   Hallucinations    Ronald Roman is a 61 y.o. male.  HPI Patient presents for caregiver concerns.  Medical history includes schizophrenia, DM, HLD, HTN, seizures, depression, OSA, GERD, and impaired cognitive ability.  [facility].  Caregiver reports that he has been having increasingly odd behaviors such as walking out of the home in the middle of the night, smoking inside, stating that he has to go to work and stating that he wants to go to his home in [place].  He has presented to behavioral health urgent care 4 days ago and today.  Both times, caregiver has stated the similar concerns and requesting that he be placed in a "locked building".  Both times he has been psychiatrically cleared.  Patient denies any current complaints.  He states that he is here to be evaluated.  He denies any current SI, HI, or AVH.    Home Medications Prior to Admission medications   Medication Sig Start Date End Date Taking? Authorizing Provider  albuterol (PROVENTIL HFA;VENTOLIN HFA) 108 (90 BASE) MCG/ACT inhaler Inhale 2 puffs into the lungs every 4 (four) hours as needed for wheezing or shortness of breath. 04/21/15   Janne Napoleon, NP  amantadine (SYMMETREL) 100 MG capsule Take 100 mg by mouth 2 (two) times daily.    [provider]  amLODipine (NORVASC) 5 MG tablet Take 1 tablet (5 mg total) by mouth daily. 11/14/20   Briant Cedar, MD  dapagliflozin propanediol (FARXIGA) 10 MG TABS tablet Take 10 mg by mouth daily after breakfast.    [provider]  divalproex (DEPAKOTE) 250 MG DR tablet Take 1 tablet (250 mg total) by mouth 3 (three) times daily. 11/18/20 12/18/20  Nwoko, Terese Door, PA  hydrOXYzine (ATARAX/VISTARIL) 50 MG tablet Take 1 tablet (50 mg total) by  mouth 4 (four) times daily. 11/22/20   Derrill Center, NP  LANTUS SOLOSTAR 100 UNIT/ML Solostar Pen Inject 20 Units into the skin at bedtime. 11/12/20   [provider]  lisinopril (ZESTRIL) 20 MG tablet Take 20 mg by mouth daily.    [provider]  magnesium oxide (MAG-OX) 400 (240 Mg) MG tablet Take 1 tablet (400 mg total) by mouth daily. 10/14/20   Pokhrel, Corrie Mckusick, MD  metFORMIN (GLUCOPHAGE) 1000 MG tablet Take 1,000 mg by mouth 2 (two) times daily with a meal.    [provider]  QUEtiapine (SEROQUEL) 200 MG tablet Take 2 tablets (400 mg total) by mouth in the morning. 11/18/20   Nwoko, Terese Door, PA  QUEtiapine (SEROQUEL) 50 MG tablet Take 1-2 tablets (50-100 mg total) by mouth 3 (three) times daily. 100 mg am, 50 mg noon and 100 mg hs 11/18/20   Nwoko, Isidoro Donning E, PA  traZODone (DESYREL) 100 MG tablet Take 1 tablet (100 mg total) by mouth at bedtime. 11/18/20   Nwoko, Terese Door, PA  zolpidem (AMBIEN) 5 MG tablet Take 5 mg by mouth at bedtime as needed for sleep.    [provider]      Allergies    Cogentin [benztropine], Penicillins, and Prolixin [fluphenazine]    Review of Systems   Review of Systems  Psychiatric/Behavioral:  Positive for behavioral problems.   All other systems reviewed and are negative.   Physical Exam Updated  Vital Signs BP (!) 130/93   Pulse 80   Temp 98.4 F (36.9 C) (Oral)   Resp 17   Ht 5\' 6"  (1.676 m)   Wt 70 kg   SpO2 100%   BMI 24.91 kg/m  Physical Exam Vitals and nursing note reviewed.  Constitutional:      General: He is not in acute distress.    Appearance: Normal appearance. He is well-developed and normal weight. He is not ill-appearing, toxic-appearing or diaphoretic.  HENT:     Head: Normocephalic and atraumatic.     Right Ear: External ear normal.     Left Ear: External ear normal.     Nose: Nose normal.     Mouth/Throat:     Mouth: Mucous membranes are moist.  Eyes:     Extraocular Movements:  Extraocular movements intact.     Conjunctiva/sclera: Conjunctivae normal.  Cardiovascular:     Rate and Rhythm: Normal rate and regular rhythm.  Pulmonary:     Effort: Pulmonary effort is normal. No respiratory distress.  Abdominal:     General: There is no distension.     Palpations: Abdomen is soft.     Tenderness: There is no abdominal tenderness.  Musculoskeletal:        General: No swelling. Normal range of motion.     Cervical back: Normal range of motion and neck supple.     Right lower leg: No edema.     Left lower leg: No edema.  Skin:    General: Skin is warm and dry.     Capillary Refill: Capillary refill takes less than 2 seconds.     Coloration: Skin is not jaundiced or pale.  Neurological:     General: No focal deficit present.     Mental Status: He is alert and oriented to person, place, and time.     Cranial Nerves: No cranial nerve deficit.     Sensory: No sensory deficit.     Motor: No weakness.     Coordination: Coordination normal.  Psychiatric:        Mood and Affect: Mood and affect normal. Mood is not anxious.        Speech: Speech normal. Speech is not rapid and pressured, slurred or tangential.        Behavior: Behavior normal. Behavior is not agitated, aggressive, withdrawn or combative. Behavior is cooperative.        Thought Content: Thought content normal. Thought content is not paranoid. Thought content does not include homicidal or suicidal ideation.     ED Results / Procedures / Treatments   Labs (all labs ordered are listed, but only abnormal results are displayed) Labs Reviewed  COMPREHENSIVE METABOLIC PANEL - Abnormal; Notable for the following components:      Result Value   BUN 34 (*)    Creatinine, Ser 1.59 (*)    GFR, Estimated 49 (*)    All other components within normal limits  CBC WITH DIFFERENTIAL/PLATELET - Abnormal; Notable for the following components:   Hemoglobin 12.0 (*)    HCT 37.0 (*)    All other components within  normal limits  ETHANOL  RAPID URINE DRUG SCREEN, HOSP PERFORMED  VALPROIC ACID LEVEL    EKG None  Radiology No results found.  Procedures Procedures    Medications Ordered in ED Medications  amantadine (SYMMETREL) capsule 100 mg (has no administration in time range)  amLODipine (NORVASC) tablet 5 mg (has no administration in time range)  divalproex (DEPAKOTE)  DR tablet 250 mg (has no administration in time range)  lisinopril (ZESTRIL) tablet 20 mg (has no administration in time range)  QUEtiapine (SEROQUEL) tablet 50-100 mg (has no administration in time range)  traZODone (DESYREL) tablet 100 mg (has no administration in time range)  albuterol (VENTOLIN HFA) 108 (90 Base) MCG/ACT inhaler 2 puff (has no administration in time range)    ED Course/ Medical Decision Making/ A&P                           Medical Decision Making Risk Prescription drug management.   This patient presents to the ED for concern of behavioral issues, this involves an extensive number of treatment options, and is a complaint that carries with it a high risk of complications and morbidity.  The differential diagnosis includes decompensated schizophrenia, undiagnosed dementia, infection, metabolic derangements, progression of chronic cognitive deficits and behavioral disturbances   Co morbidities that complicate the patient evaluation  schizophrenia, DM, HLD, HTN, seizures, depression, OSA, GERD, and impaired cognitive ability   Additional history obtained:  Additional history obtained from patient's PCP, patient's group home manager External records from outside source obtained and reviewed including EMR   Lab Tests:  I Ordered, and personally interpreted labs.  The pertinent results include: Baseline anemia, no leukocytosis, baseline CKD, normal electrolytes, therapeutic level of Depakote, UDS negative  Consultations Obtained:  I requested consultation with the social work,  and discussed  lab and imaging findings as well as pertinent plan - they recommend: Discharge back to group home   Problem List / ED Course / Critical interventions / Medication management  Patient is a 61 year old male with history of schizophrenia and decreased cognitive ability, presenting from group home for behavioral concerns.  He has been evaluated for similar complaints several times at [facility] over the past several days.  He was evaluated and cleared by psychiatry shortly prior to arrival in the emergency department.  Behavioral concerns are described as wandering off in the middle of the night, smoking inside, accusing others of stealing his belongings, and disorientation to his situation.  On arrival in the ED, patient is well-appearing.  His vital signs are normal.  He is found sitting on ED stretcher eating some crackers.  He denies any physical complaints.  When asked why he is here he states that he was sent here for an evaluation.  He denies SI, HI, or AVH.  Lab work was obtained for medical clearance.  I spoke with his PCP, Dr. Lynden Ang as well as his group home manager, Ms. [name].  Both expressed similar concerns of the patient's ongoing behaviors.  From what they are describing, patient could be developing dementia.  Patient's current condition and history provided by his caregivers are not consistent with decompensated schizophrenia.  Furthermore, he was evaluated and cleared by psychiatry team at behavioral health urgent care shortly prior to arrival.  Patient's lab work was unremarkable.  He remained calm and cooperative while in the ED.  I did engage with social work to help disposition.  Social work had further conversations with patient's caregivers.  Initially, Ms. Wallis Bamberg was willing to accept him back.  This reportedly changed and there will need to be further discussions with social work and caregivers.  Plan was for patient to remain in the ED overnight.  Home medications were ordered.  Group  home manager was subsequently agreeable to take him back.  She states that taxi will be  adequate transport and she will be there to receive him.  Patient was discharged in stable condition. I ordered medication including home medications   Social Determinants of Health:  History of schizophrenia and impaired cognitive ability, currently residing in a group home        Final Clinical Impression(s) / ED Diagnoses Final diagnoses:  Behavior concern    Rx / DC Orders ED Discharge Orders     None         Godfrey Pick, MD 04/07/22 2235    Godfrey Pick, MD 04/07/22 2329

## 2022-04-07 NOTE — Discharge Instructions (Addendum)
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
## 2022-04-07 NOTE — ED Notes (Signed)
Synetta Fail at patient's group home refuses to accept patient. Social work aware of this.

## 2022-04-07 NOTE — ED Notes (Signed)
Synetta Fail from group home called back. She states that a taxi is fine for discharge. We will provide a taxi voucher and she assures that she will be waiting on patient

## 2022-04-07 NOTE — Progress Notes (Signed)
[facility] spoke with the client's GH Admin.  She reports he's stating he wants to leave and go to [employer].  She reports  his behavior have been worsening for the past two months.  She reports the major issue is that he wants to leave her home.  She reports no aggressive behaviors today.  She reports she's been in contact with PCP / medication management ongoing.  [facility] instructed her to contact [facility] at [phone] to establish a care coordinator.  [facility] informed her they will help to implement additional services and help find a new placement if needed.  Pt stated "nothing is wrong with me".  Pt was coherent.  Provider reported [facility] Admin is already in the process of working on guardianship.  [facility] also discussed ACTT services with GH Admin.    Rhea Bleacher [facility]

## 2022-04-07 NOTE — Progress Notes (Signed)
Synetta Fail is trying to find staff to help come get the Patient it may be in the AM however this CSW and Dr. Durwin Nora both explained to the owner that we are not the facility to help with a higher level of care and he was cleared and stable and will need to return back to the group home at this time. TOC will continue to follow for any DC needs.

## 2022-04-07 NOTE — Progress Notes (Signed)
CSW spoke to PCP: Dr Ileana Roup [phone] who states that the group home will not take this patient back as his level of care has gotten to be overwhelming Dr. Durwin Nora and this CSW both explained that the patient can not just be dropped of in the ED as the group home had to follow policy and help this patient find an  appropriate level of care. Dr. Loistine Chance reports not knowing who to call in regards to someone picking the patient up reports contacted CSW to see if we had any other options for this patient. TOC will continue to follow. CSW also called the number in the chart. The number for the second time went to VM.

## 2022-04-07 NOTE — ED Triage Notes (Addendum)
Pt brought in from [facility] with caregiver who states that she believes he is hallucinating. She states that he has been walking out of the home in the middle of night, showering with clothes on, stating that his mother is outside and going to pick him up, and that he has been talking to himself. Pt has hx of schizophrenia. Per caregiver, she has taken him to [facility] and IVC'd him before, but he does not meet inpatient criteria.

## 2022-04-07 NOTE — ED Notes (Signed)
Patient discharged by Doran Heater, NP with written and verbal instructions.

## 2022-04-07 NOTE — Social Work (Signed)
CSW called number in the chart left a message, provider is asking for SNF, CSW also asked for PT. Patient will need to DC back to facility.
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
## 2022-04-07 NOTE — ED Notes (Signed)
Pt laying in bed, calm and cooperative

## 2022-04-07 NOTE — ED Notes (Addendum)
Synetta Fail called social worker Darlin Priestly back and states that pt may come back to group home if we find him a ride, pt does not qualify for PTAR, safe ride does not take pt's home after 6 pm, attempted to call Synetta Fail x2 at [phone] to see if it safe to send pt in a cab home, no answer.

## 2022-04-07 NOTE — ED Notes (Signed)
Per Caregiver, pt's PCP would like to speak to physician Dr Ileana Roup [phone]

## 2022-05-29 ENCOUNTER — Emergency Department (HOSPITAL_COMMUNITY): Payer: Medicaid Other

## 2022-05-29 ENCOUNTER — Other Ambulatory Visit: Payer: Self-pay

## 2022-05-29 ENCOUNTER — Emergency Department (HOSPITAL_COMMUNITY)
Admission: EM | Admit: 2022-05-29 | Discharge: 2022-05-30 | Disposition: A | Discharge status: Home / Self Care | Payer: Medicaid Other | Attending: Emergency Medicine | Admitting: Emergency Medicine

## 2022-05-29 ENCOUNTER — Encounter (HOSPITAL_COMMUNITY): Payer: Self-pay | Admitting: Pharmacy Technician

## 2022-05-29 DIAGNOSIS — J101 Influenza due to other identified influenza virus with other respiratory manifestations: Secondary | ICD-10-CM | POA: Insufficient documentation

## 2022-05-29 DIAGNOSIS — R5383 Other fatigue: Secondary | ICD-10-CM | POA: Diagnosis present

## 2022-05-29 DIAGNOSIS — Z20822 Contact with and (suspected) exposure to covid-19: Secondary | ICD-10-CM | POA: Diagnosis not present

## 2022-05-29 DIAGNOSIS — E119 Type 2 diabetes mellitus without complications: Secondary | ICD-10-CM | POA: Diagnosis not present

## 2022-05-29 DIAGNOSIS — Z7984 Long term (current) use of oral hypoglycemic drugs: Secondary | ICD-10-CM | POA: Diagnosis not present

## 2022-05-29 DIAGNOSIS — I10 Essential (primary) hypertension: Secondary | ICD-10-CM | POA: Diagnosis not present

## 2022-05-29 DIAGNOSIS — R251 Tremor, unspecified: Secondary | ICD-10-CM | POA: Insufficient documentation

## 2022-05-29 DIAGNOSIS — Z79899 Other long term (current) drug therapy: Secondary | ICD-10-CM | POA: Insufficient documentation

## 2022-05-29 DIAGNOSIS — Z794 Long term (current) use of insulin: Secondary | ICD-10-CM | POA: Diagnosis not present

## 2022-05-29 LAB — RESP PANEL BY RT-PCR (RSV, FLU A&B, COVID)  RVPGX2
Influenza A by PCR: POSITIVE — AB
Influenza B by PCR: NEGATIVE
Resp Syncytial Virus by PCR: NEGATIVE
SARS Coronavirus 2 by RT PCR: NEGATIVE

## 2022-05-29 LAB — COMPREHENSIVE METABOLIC PANEL
ALT: 13 U/L (ref 0–44)
AST: 23 U/L (ref 15–41)
Albumin: 3.5 g/dL (ref 3.5–5.0)
Alkaline Phosphatase: 93 U/L (ref 38–126)
Anion gap: 12 (ref 5–15)
BUN: 24 mg/dL — ABNORMAL HIGH (ref 8–23)
CO2: 20 mmol/L — ABNORMAL LOW (ref 22–32)
Calcium: 9.4 mg/dL (ref 8.9–10.3)
Chloride: 105 mmol/L (ref 98–111)
Creatinine, Ser: 1.82 mg/dL — ABNORMAL HIGH (ref 0.61–1.24)
GFR, Estimated: 42 mL/min — ABNORMAL LOW (ref >60)
Glucose, Bld: 107 mg/dL — ABNORMAL HIGH (ref 70–99)
Potassium: 4.4 mmol/L (ref 3.5–5.1)
Sodium: 137 mmol/L (ref 135–145)
Total Bilirubin: 0.4 mg/dL (ref 0.3–1.2)
Total Protein: 7.6 g/dL (ref 6.5–8.1)

## 2022-05-29 LAB — CBC WITH DIFFERENTIAL/PLATELET
Abs Immature Granulocytes: 0.05 10*3/uL (ref 0.00–0.07)
Basophils Absolute: 0 10*3/uL (ref 0.0–0.1)
Basophils Relative: 0 %
Eosinophils Absolute: 0.2 10*3/uL (ref 0.0–0.5)
Eosinophils Relative: 2 %
HCT: 35.5 % — ABNORMAL LOW (ref 39.0–52.0)
Hemoglobin: 11.9 g/dL — ABNORMAL LOW (ref 13.0–17.0)
Immature Granulocytes: 1 %
Lymphocytes Relative: 25 %
Lymphs Abs: 2.1 10*3/uL (ref 0.7–4.0)
MCH: 27 pg (ref 26.0–34.0)
MCHC: 33.5 g/dL (ref 30.0–36.0)
MCV: 80.7 fL (ref 80.0–100.0)
Monocytes Absolute: 0.9 10*3/uL (ref 0.1–1.0)
Monocytes Relative: 11 %
Neutro Abs: 5.1 10*3/uL (ref 1.7–7.7)
Neutrophils Relative %: 61 %
Platelets: 207 10*3/uL (ref 150–400)
RBC: 4.4 MIL/uL (ref 4.22–5.81)
RDW: 14.8 % (ref 11.5–15.5)
WBC: 8.3 10*3/uL (ref 4.0–10.5)
nRBC: 0 % (ref 0.0–0.2)

## 2022-05-29 LAB — CBG MONITORING, ED
Glucose-Capillary: 69 mg/dL — ABNORMAL LOW (ref 70–99)
Glucose-Capillary: 199 mg/dL — ABNORMAL HIGH (ref 70–99)

## 2022-05-29 MED ORDER — SODIUM CHLORIDE 0.9 % IV BOLUS
1000.0000 mL | Freq: Once | INTRAVENOUS | Status: AC
  Administered 2022-05-29: 1000 mL via INTRAVENOUS

## 2022-05-29 NOTE — ED Notes (Signed)
Two RN's attempted IV sticks, pt twists arm and won't stay still, veins blew and would not flush. IV team consult

## 2022-05-29 NOTE — Discharge Instructions (Addendum)
You came to the emergency department today with weakness and feeling sick.  Your facility also reported that you were shaking yesterday and today.  Your workup today is not suspicious for a seizure however I would like for you to follow-up with your neurologist about these episodes your facility is reporting.  I do not see any recent visits with a neurologist so I have placed a formal referral.  Look out for phone call from them within the next 72 hours.  If you do not receive a call, t are 2 offices on these discharge papers that you may call for an appointment. You also tested positive for influenza A. Since you do not know how long you have been feeling sick for, you are out of the window treatment. You should hydrate at home with plenty of water.  Please do not hesitate to return with any worsening or recurring symptoms.  It was a pleasure to meet you and we will hope you feel better!

## 2022-05-29 NOTE — ED Notes (Signed)
Two attempts to get patient to use urinal for urinalysis have been unsuccessful. Started Iv fluids and will reattempt

## 2022-05-29 NOTE — ED Notes (Signed)
Pt given a cup of orange juice for low CBG

## 2022-05-29 NOTE — ED Provider Triage Note (Addendum)
Emergency Medicine Provider Triage Evaluation Note  Ronald Roman , a 62 y.o. male  was evaluated in triage.  Pt is poor historian.  States he was brought here by his mother.  Does not report any complaints when asked.  Denies pain, fatigue, shortness of breath.  Pt acknowledges he hears and understands the questions.    Hx of paranoid schizophrenia, DMT2, acute metabolic encephalopathy.  Review of Systems  Positive:  Negative: See above  Physical Exam  There were no vitals taken for this visit. Gen:   Awake, no distress   Resp:  Normal effort  MSK:   Moves extremities without difficulty  Other:  Sitting in chair comfortably.  Fidgeting with pulse oximeter.  Minimal eye contact.  Mumbles, speaks softly or does not respond directly to questions.  Medical Decision Making  Medically screening exam initiated at 12:09 PM.  Appropriate orders placed.  Ronald Roman was informed that the remainder of the evaluation will be completed by another provider, this initial triage assessment does not replace that evaluation, and the importance of remaining in the ED until their evaluation is complete.  Baseline unknown.     Prince Rome, PA-C 40/98/11 1223

## 2022-05-29 NOTE — ED Provider Notes (Signed)
Physical Exam  BP 101/79   Pulse (!) 111   Temp 98.5 F (36.9 C)   Resp 18   SpO2 100%   Physical Exam Vitals and nursing note reviewed.  Constitutional:      General: He is not in acute distress.    Appearance: He is well-developed.  HENT:     Head: Normocephalic and atraumatic.  Eyes:     Conjunctiva/sclera: Conjunctivae normal.  Cardiovascular:     Rate and Rhythm: Normal rate and regular rhythm.     Heart sounds: No murmur heard. Pulmonary:     Effort: Pulmonary effort is normal. No respiratory distress.     Breath sounds: Normal breath sounds.  Abdominal:     Palpations: Abdomen is soft.     Tenderness: There is no abdominal tenderness.  Musculoskeletal:        General: No swelling.     Cervical back: Neck supple.  Skin:    General: Skin is warm and dry.     Capillary Refill: Capillary refill takes less than 2 seconds.  Neurological:     Mental Status: He is alert.     Comments: Follows commands easily. Has some difficulty with right sided finger to nose test.  Psychiatric:        Mood and Affect: Mood normal.     Procedures  Procedures  ED Course / MDM   Clinical Course as of 05/29/22 2110  Sat May 29, 2022  1427 I spoke with the building administration at the patient's home and she reports that the patient was having shaking last night and was having difficulty getting the food inside of his mouth.  They thought that he was cold but this morning he was doing the same thing.  Appears bilateral so they sent him here.  No history of this [MR]  1517 Fatigue, shaking yesterday which he denies, dispo pending urine, resp panel. Anticipate DC [AS]  1924 Influenza A By PCR(!): POSITIVE Flu A positive, patient believes he has had symptoms for at least 3 days.  [AS]    Clinical Course User Index [AS] Loreto Loescher, Grafton Folk, PA-C [MR] Redwine, Cecilio Asper, PA-C   Medical Decision Making Amount and/or Complexity of Data Reviewed Labs:  Decision-making details  documented in ED Course. Radiology: ordered.  This patient presents to the ED for concern of fatigue, shaking, this involves an extensive number of treatment options, and is a complaint that carries with it a high risk of complications and morbidity.  The differential diagnosis includes URI, dehydration, electrolyte abnormality, anemia, CVA, seizure  Co morbidities that complicate the patient evaluation  paranoid schizophrenia, diabetes, hyperlipidemia, hypertension, depression, seizure disorder  My initial workup includes  Additional history obtained from: Nursing notes from this visit.  I reviewed and interpreted labs which include: urinalysis, respiratory panel, CMP, CBC. Elevated creatinine of 1.82, review of charts shows creatinine is often variable. BUN of 24, slight dehydration. Stable anemia of 11.9.   I reviewed imaging studies including CXR, CT head I independently visualized and interpreted imaging which showed normal I agree with the radiologist interpretation  Care assumed from [facility], PA-C at shift change.  Please see her note for full HPI.  In short, patient is a 62 year old male presents the ED for evaluation of fatigue.  Does not know how long he has been fatigued for.  Someone at the assisted living home that he lives at believes they saw him shaking yesterday.  Secondary school teacher reports also seeing this and  states that it did not look like seizure activity.  Patient denies feeling like he had a seizure recently.  Has been taking his medications as prescribed.  Current plan is to wait for respiratory panel and urinalysis results.  Rehydrate patient.  If labs are normal, he will be stable for discharge back to his assisted living home.   2110- patient tested positive for Influenza A. Is very vague with how long he has been feeling sick, states it feels like he has been sick for a month. Will forego tamiflu due to this. He is hemodynamically stable and in no acute  distress. He specifically denies dysuria, frequency or urgency. Has been unwilling to provide urine sample throughout his entire stay. Low likelihood of UTI as the cause of his symptoms given flu status. He was encouraged to drink plenty of fluids and eat a normal diet. He was also encouraged to follow up with his neurologist due to the shaking episodes. Given return precautions. Stable at discharge.  At this time there does not appear to be any evidence of an acute emergency medical condition and the patient appears stable for discharge with appropriate outpatient follow up. Diagnosis was discussed with patient who verbalizes understanding of care plan and is agreeable to discharge. I have discussed return precautions with patient who verbalizes understanding. Patient encouraged to follow-up with their PCP within 1 week. All questions answered.  Patient's case discussed with Dr. Criss Alvine who agrees with plan to discharge with follow-up.   Note: Portions of this report may have been transcribed using voice recognition software. Every effort was made to ensure accuracy; however, inadvertent computerized transcription errors may still be present.    Michelle Piper, Cordelia Poche 05/29/22 2300    Pricilla Loveless, MD 05/30/22 [phone]

## 2022-05-29 NOTE — ED Provider Notes (Addendum)
[facility] EMERGENCY DEPARTMENT Provider Note   CSN: [id number] Arrival date & time: 05/29/22  1156     History  Chief Complaint  Patient presents with   Fatigue    Ronald Roman is a 62 y.o. male with a past medical history of diabetes, hyperlipidemia, hypertension, depression and impaired cognitive ability presenting today with concern of fatigue and a "cold."  Denies chest pain, shortness of breath, palpitations, dizziness, syncope.  I spoke with the building administration at the patient's home and she reports that the patient was having shaking last night and was having difficulty getting the food inside of his mouth.  They thought that he was cold but this morning he was doing the same thing.  Shaking appered bilateral so they sent him here.  No history of this type of shaking, although does have seizure history. They report he gets his medication and that this did not appear to be a seizure.  HPI     Home Medications Prior to Admission medications   Medication Sig Start Date End Date Taking? Authorizing Provider  albuterol (PROVENTIL HFA;VENTOLIN HFA) 108 (90 BASE) MCG/ACT inhaler Inhale 2 puffs into the lungs every 4 (four) hours as needed for wheezing or shortness of breath. 04/21/15   Hayden Rasmussen, NP  amantadine (SYMMETREL) 100 MG capsule Take 100 mg by mouth 2 (two) times daily.    [provider]  amLODipine (NORVASC) 5 MG tablet Take 1 tablet (5 mg total) by mouth daily. 11/14/20   Lauro Franklin, MD  dapagliflozin propanediol (FARXIGA) 10 MG TABS tablet Take 10 mg by mouth daily after breakfast.    [provider]  divalproex (DEPAKOTE) 250 MG DR tablet Take 1 tablet (250 mg total) by mouth 3 (three) times daily. 11/18/20 12/18/20  Nwoko, Tommas Olp, PA  hydrOXYzine (ATARAX/VISTARIL) 50 MG tablet Take 1 tablet (50 mg total) by mouth 4 (four) times daily. 11/22/20   Oneta Rack, NP  LANTUS SOLOSTAR 100 UNIT/ML Solostar Pen  Inject 20 Units into the skin at bedtime. 11/12/20   [provider]  lisinopril (ZESTRIL) 20 MG tablet Take 20 mg by mouth daily.    [provider]  magnesium oxide (MAG-OX) 400 (240 Mg) MG tablet Take 1 tablet (400 mg total) by mouth daily. 10/14/20   Pokhrel, Rebekah Chesterfield, MD  metFORMIN (GLUCOPHAGE) 1000 MG tablet Take 1,000 mg by mouth 2 (two) times daily with a meal.    [provider]  QUEtiapine (SEROQUEL) 200 MG tablet Take 2 tablets (400 mg total) by mouth in the morning. 11/18/20   Nwoko, Tommas Olp, PA  QUEtiapine (SEROQUEL) 50 MG tablet Take 1-2 tablets (50-100 mg total) by mouth 3 (three) times daily. 100 mg am, 50 mg noon and 100 mg hs 11/18/20   Nwoko, Stephens Shire E, PA  traZODone (DESYREL) 100 MG tablet Take 1 tablet (100 mg total) by mouth at bedtime. 11/18/20   Nwoko, Tommas Olp, PA  zolpidem (AMBIEN) 5 MG tablet Take 5 mg by mouth at bedtime as needed for sleep.    [provider]      Allergies    Cogentin [benztropine], Penicillins, and Prolixin [fluphenazine]    Review of Systems   Review of Systems  Physical Exam Updated Vital Signs BP 101/79   Pulse (!) 111   Temp 98.5 F (36.9 C)   Resp 18   SpO2 100%  Physical Exam Vitals and nursing note reviewed.  Constitutional:  General: He is not in acute distress.    Appearance: Normal appearance. He is not ill-appearing.     Comments: No noted urinary incontinence  HENT:     Head: Normocephalic and atraumatic.     Nose: Rhinorrhea present.     Mouth/Throat:     Mouth: Mucous membranes are dry.     Pharynx: Oropharynx is clear.     Comments: No evidence of trauma in oropharynx Eyes:     General: No scleral icterus.    Extraocular Movements: Extraocular movements intact.     Conjunctiva/sclera: Conjunctivae normal.     Pupils: Pupils are equal, round, and reactive to light.  Cardiovascular:     Rate and Rhythm: Regular rhythm. Tachycardia present.  Pulmonary:     Effort: Pulmonary  effort is normal. No respiratory distress.     Breath sounds: Normal breath sounds.  Abdominal:     General: Abdomen is flat.     Palpations: Abdomen is soft.     Tenderness: There is no abdominal tenderness.  Musculoskeletal:        General: Normal range of motion.     Right lower leg: No edema.     Left lower leg: No edema.  Skin:    General: Skin is warm and dry.     Findings: No rash.  Neurological:     Mental Status: He is alert.     Comments: Cranial nerves II through XII grossly intact.  Of note, I do have to repeat instructions multiple times.  Dysmetria on right-sided finger-nose, normal left-sided.  Full range of motion of bilateral upper and lower extremities with normal strength.  Psychiatric:        Mood and Affect: Mood normal.     ED Results / Procedures / Treatments   Labs (all labs ordered are listed, but only abnormal results are displayed) Labs Reviewed  COMPREHENSIVE METABOLIC PANEL - Abnormal; Notable for the following components:      Result Value   CO2 20 (*)    Glucose, Bld 107 (*)    BUN 24 (*)    Creatinine, Ser 1.82 (*)    GFR, Estimated 42 (*)    All other components within normal limits  CBC WITH DIFFERENTIAL/PLATELET - Abnormal; Notable for the following components:   Hemoglobin 11.9 (*)    HCT 35.5 (*)    All other components within normal limits  RESP PANEL BY RT-PCR (RSV, FLU A&B, COVID)  RVPGX2  URINALYSIS, ROUTINE W REFLEX MICROSCOPIC  VALPROIC ACID LEVEL  CBG MONITORING, ED    EKG None  Radiology DG Chest Portable 1 View  Result Date: 05/29/2022 CLINICAL DATA:  Shortness of breath EXAM: PORTABLE CHEST 1 VIEW COMPARISON:  November 25, 2021 FINDINGS: No pneumothorax. The cardiomediastinal silhouette is normal. No nodules or masses. No focal infiltrates. IMPRESSION: No active disease. Electronically Signed   By: Dorise Bullion III M.D.   On: 05/29/2022 14:29    Procedures Procedures   Medications Ordered in ED Medications  sodium  chloride 0.9 % bolus 1,000 mL (has no administration in time range)    ED Course/ Medical Decision Making/ A&P Clinical Course as of 05/29/22 1525  Sat May 29, 2022  1427 I spoke with the building administration at the patient's home and she reports that the patient was having shaking last night and was having difficulty getting the food inside of his mouth.  They thought that he was cold but this morning he was doing the same  thing.  Appears bilateral so they sent him here.  No history of this [MR]  1517 Fatigue, shaking yesterday which he denies, dispo pending urine, resp panel. Anticipate DC [AS]    Clinical Course User Index [AS] Schutt, Edsel Petrin, PA-C [MR] Gaige Sebo, Gabriel Cirri, PA-C                           Medical Decision Making Amount and/or Complexity of Data Reviewed Labs: ordered. Radiology: ordered.   62 year old male presenting today due to fatigue.  Differential includes but is not limited to dehydration, electrolyte abnormality, anemia, CVA.  This is not an exhaustive differential.    Past Medical History / Co-morbidities / Social History: Paranoid schizophrenia, abnormal behavior, diabetes, hyperlipidemia, hypertension and depression   Additional history: Patient's facility said that the patient was shaking last night and also shaking today.  They said it did not look like a seizure but he could barely feed himself which is not typical.    Physical Exam: Pertinent physical exam findings include Some dysmetria with right-sided finger-nose No other abnormalities on neuro exam  Lab Tests: I ordered, and personally interpreted labs.  The pertinent results include: Baseline kidney function and hemoglobin   Imaging Studies: Chest x-ray negative   Medications: N/A  MDM/Disposition: This is a 62 year old male presenting today due to shaking.  He tells me that he has been feeling sick today as if he had the flu.  He says that he was feeling weak and shaky but he  says that he does not believe that he was having trouble feeding himself.  He tells me that this is not sure.  CBC and CMP unremarkable.  X-ray also unremarkable.  On my physical exam patient has mild dysmetria with finger-nose on the right side however the rest of his neurologic exam is negative.  Of note, I also believe he was having difficulty understanding my commands and I am not sure that this is a 100% solid finding due to patient's intellectual disorder.  I believe relying on negative head CT is reasonable.  Do not believe he needs an MRI to further work this up.  On my exam he also does not appear encephalopathic.  Also does not appear postictal.  Facility reports that his shaking was in his bilateral upper extremities but not his lower, which would be very strange for a seizure.  No obvious deformities or urinary incontinence.  Does not have any signs of trauma in the oropharynx.  Ultimately, my suspicion for seizure is very low.  He answers all my questions and is able to provide his own history.  At this time patient's urine and COVID are still pending.  I believe both of these are needed to rule out any cause of patient's abnormal behavior per his living facility.  I do not believe that abnormal UA or positive COVID/flu will change that patient is likely stable for discharge.  Suspect some viral illness. Patient signed out to [facility], PA-C.  Please see his chart for the results of these tests and ultimate disposition.     Woodroe Chen 05/29/22 1528    Linwood Dibbles, MD 05/30/22 [phone]

## 2022-05-29 NOTE — ED Triage Notes (Signed)
Pt bib ems from group home with reports of ? Leg pain X6 months. EMS reports staff and patient poor historian. Pt has not been acting like himself for the last few days. Increased fatigue. Pt just states he does not feel well. Warm to the touch.  78/48 initial BP IV access attempted X2 without success

## 2022-06-02 ENCOUNTER — Encounter: Payer: Self-pay | Admitting: Podiatry

## 2022-06-02 ENCOUNTER — Ambulatory Visit (INDEPENDENT_AMBULATORY_CARE_PROVIDER_SITE_OTHER): Payer: Medicaid Other | Admitting: Podiatry

## 2022-06-02 DIAGNOSIS — M79675 Pain in left toe(s): Secondary | ICD-10-CM

## 2022-06-02 DIAGNOSIS — M79674 Pain in right toe(s): Secondary | ICD-10-CM

## 2022-06-02 DIAGNOSIS — B351 Tinea unguium: Secondary | ICD-10-CM | POA: Diagnosis not present

## 2022-06-02 DIAGNOSIS — E119 Type 2 diabetes mellitus without complications: Secondary | ICD-10-CM

## 2022-06-02 NOTE — Progress Notes (Signed)
This patient returns to my office for at risk foot care.  This patient requires this care by a professional since this patient will be at risk due to having diabetes and kidney injury.  This patient is unable to cut nails himself since the patient cannot reach his nails.These nails are painful walking and wearing shoes.  Patient has not been seen in over 1 and a half years.  This patient presents for at risk foot care today.  General Appearance  Alert, conversant and in no acute stress.  Vascular  Dorsalis pedis and posterior tibial  pulses are palpable  bilaterally.  Capillary return is within normal limits  bilaterally. Temperature is within normal limits  bilaterally.  Neurologic  Senn-Weinstein monofilament wire test within normal limits  bilaterally. Muscle power within normal limits bilaterally.  Nails Thick disfigured discolored nails with subungual debris  1 right and 5 left.. No evidence of bacterial infection or drainage bilaterally.  Orthopedic  No limitations of motion  feet .  No crepitus or effusions noted.  No bony pathology or digital deformities noted.  Skin  normotropic skin with no porokeratosis noted bilaterally.  No signs of infections or ulcers noted.     Onychomycosis  Pain in right toes  Pain in left toes  Consent was obtained for treatment procedures.   Mechanical debridement of nails 1-5  bilaterally performed with a nail nipper.  Filed with dremel without incident.    Return office visit  6 months                   Told patient to return for periodic foot care and evaluation due to potential at risk complications.   Gardiner Barefoot DPM

## 2022-06-10 ENCOUNTER — Encounter: Payer: Self-pay | Admitting: Neurology

## 2022-07-05 ENCOUNTER — Ambulatory Visit: Payer: Medicaid Other | Admitting: Neurology

## 2022-07-08 ENCOUNTER — Emergency Department (HOSPITAL_COMMUNITY)
Admission: EM | Admit: 2022-07-08 | Discharge: 2022-07-08 | Disposition: A | Discharge status: Home / Self Care | Payer: Medicaid Other | Attending: Emergency Medicine | Admitting: Emergency Medicine

## 2022-07-08 ENCOUNTER — Encounter (HOSPITAL_COMMUNITY): Payer: Self-pay

## 2022-07-08 ENCOUNTER — Emergency Department (HOSPITAL_COMMUNITY): Payer: Medicaid Other

## 2022-07-08 ENCOUNTER — Other Ambulatory Visit: Payer: Self-pay

## 2022-07-08 DIAGNOSIS — D631 Anemia in chronic kidney disease: Secondary | ICD-10-CM | POA: Diagnosis not present

## 2022-07-08 DIAGNOSIS — R944 Abnormal results of kidney function studies: Secondary | ICD-10-CM | POA: Diagnosis not present

## 2022-07-08 DIAGNOSIS — E1122 Type 2 diabetes mellitus with diabetic chronic kidney disease: Secondary | ICD-10-CM | POA: Insufficient documentation

## 2022-07-08 DIAGNOSIS — R531 Weakness: Secondary | ICD-10-CM | POA: Insufficient documentation

## 2022-07-08 DIAGNOSIS — Z7984 Long term (current) use of oral hypoglycemic drugs: Secondary | ICD-10-CM | POA: Insufficient documentation

## 2022-07-08 DIAGNOSIS — I129 Hypertensive chronic kidney disease with stage 1 through stage 4 chronic kidney disease, or unspecified chronic kidney disease: Secondary | ICD-10-CM | POA: Diagnosis not present

## 2022-07-08 DIAGNOSIS — N189 Chronic kidney disease, unspecified: Secondary | ICD-10-CM | POA: Insufficient documentation

## 2022-07-08 DIAGNOSIS — Z79899 Other long term (current) drug therapy: Secondary | ICD-10-CM | POA: Diagnosis not present

## 2022-07-08 LAB — I-STAT VENOUS BLOOD GAS, ED
Acid-Base Excess: 0 mmol/L (ref 0.0–2.0)
Bicarbonate: 25.1 mmol/L (ref 20.0–28.0)
Calcium, Ion: 1.28 mmol/L (ref 1.15–1.40)
HCT: 37 % — ABNORMAL LOW (ref 39.0–52.0)
Hemoglobin: 12.6 g/dL — ABNORMAL LOW (ref 13.0–17.0)
O2 Saturation: 59 %
Potassium: 5 mmol/L (ref 3.5–5.1)
Sodium: 145 mmol/L (ref 135–145)
TCO2: 26 mmol/L (ref 22–32)
pCO2, Ven: 41.6 mmHg — ABNORMAL LOW (ref 44–60)
pH, Ven: 7.389 (ref 7.25–7.43)
pO2, Ven: 31 mmHg — CL (ref 32–45)

## 2022-07-08 LAB — BASIC METABOLIC PANEL
Anion gap: 18 — ABNORMAL HIGH (ref 5–15)
Anion gap: 10 (ref 5–15)
BUN: 46 mg/dL — ABNORMAL HIGH (ref 8–23)
BUN: 39 mg/dL — ABNORMAL HIGH (ref 8–23)
CO2: 15 mmol/L — ABNORMAL LOW (ref 22–32)
CO2: 24 mmol/L (ref 22–32)
Calcium: 9.6 mg/dL (ref 8.9–10.3)
Calcium: 9.7 mg/dL (ref 8.9–10.3)
Chloride: 109 mmol/L (ref 98–111)
Chloride: 107 mmol/L (ref 98–111)
Creatinine, Ser: 1.69 mg/dL — ABNORMAL HIGH (ref 0.61–1.24)
Creatinine, Ser: 1.54 mg/dL — ABNORMAL HIGH (ref 0.61–1.24)
GFR, Estimated: 46 mL/min — ABNORMAL LOW (ref >60)
GFR, Estimated: 51 mL/min — ABNORMAL LOW (ref >60)
Glucose, Bld: 85 mg/dL (ref 70–99)
Glucose, Bld: 71 mg/dL (ref 70–99)
Potassium: 5.3 mmol/L — ABNORMAL HIGH (ref 3.5–5.1)
Potassium: 4.5 mmol/L (ref 3.5–5.1)
Sodium: 142 mmol/L (ref 135–145)
Sodium: 141 mmol/L (ref 135–145)

## 2022-07-08 LAB — CBC
HCT: 32.2 % — ABNORMAL LOW (ref 39.0–52.0)
Hemoglobin: 10.5 g/dL — ABNORMAL LOW (ref 13.0–17.0)
MCH: 27.8 pg (ref 26.0–34.0)
MCHC: 32.6 g/dL (ref 30.0–36.0)
MCV: 85.2 fL (ref 80.0–100.0)
Platelets: 145 10*3/uL — ABNORMAL LOW (ref 150–400)
RBC: 3.78 MIL/uL — ABNORMAL LOW (ref 4.22–5.81)
RDW: 17.1 % — ABNORMAL HIGH (ref 11.5–15.5)
WBC: 8.6 10*3/uL (ref 4.0–10.5)
nRBC: 0 % (ref 0.0–0.2)

## 2022-07-08 LAB — LACTIC ACID, PLASMA
Lactic Acid, Venous: 2.4 mmol/L (ref 0.5–1.9)
Lactic Acid, Venous: 1.9 mmol/L (ref 0.5–1.9)

## 2022-07-08 LAB — TROPONIN I (HIGH SENSITIVITY)
Troponin I (High Sensitivity): 6 ng/L (ref <18)
Troponin I (High Sensitivity): 5 ng/L (ref <18)

## 2022-07-08 LAB — BETA-HYDROXYBUTYRIC ACID: Beta-Hydroxybutyric Acid: 0.25 mmol/L (ref 0.05–0.27)

## 2022-07-08 MED ORDER — LACTATED RINGERS IV BOLUS
1000.0000 mL | Freq: Once | INTRAVENOUS | Status: AC
  Administered 2022-07-08: 1000 mL via INTRAVENOUS

## 2022-07-08 NOTE — ED Triage Notes (Signed)
Pt arrived via [facility] from a group home for general decline from baseline 2-3wks, decrease in appetite, increased falls, difficulty w/gait. Per EMS pt is A&Ox4. Pt told EMS he fell yesterday and c/o knee bilat pain.

## 2022-07-08 NOTE — Discharge Instructions (Signed)
You were evaluated in the emergency department, gave you fluids and perform labs which initially showed possible dehydration and some metabolic abnormalities.  However on repeat it is normal and you are feeling better.  Please continue your medications at home as prescribed.  Ensure you are taking at oral intake including fluids and food.  Please return to the ED if you have worsening of your weakness or inability to tolerate oral intake.

## 2022-07-08 NOTE — ED Provider Notes (Signed)
[facility] Provider Note   CSN: [id number] Arrival date & time: 07/08/22  1249     History  Chief Complaint  Patient presents with   Weakness    Ronald Roman is a 62 y.o. male.  This is a 62 year old male with history of paranoid schizophrenia, diabetes on Farxiga, hypertension, CKD, seizures presenting to the ED for weakness.  Patient reportedly comes from a group home, has had a decrease in appetite, increased falls, difficulty with his gait.  Patient also states he had a seizure earlier today which is not atypical for him, he frequently has seizures and they are short-lived and do not require abortive medications.  He reportedly fell yesterday, states he is compliant with his medications.  Denies any fevers, chills, chest pain, shortness of breath.     Home Medications Prior to Admission medications   Medication Sig Start Date End Date Taking? Authorizing Provider  albuterol (PROVENTIL HFA;VENTOLIN HFA) 108 (90 BASE) MCG/ACT inhaler Inhale 2 puffs into the lungs every 4 (four) hours as needed for wheezing or shortness of breath. 04/21/15   Janne Napoleon, NP  amantadine (SYMMETREL) 100 MG capsule Take 100 mg by mouth 2 (two) times daily.    [provider]  amLODipine (NORVASC) 5 MG tablet Take 1 tablet (5 mg total) by mouth daily. 11/14/20   Briant Cedar, MD  dapagliflozin propanediol (FARXIGA) 10 MG TABS tablet Take 10 mg by mouth daily after breakfast.    [provider]  divalproex (DEPAKOTE) 250 MG DR tablet Take 1 tablet (250 mg total) by mouth 3 (three) times daily. 11/18/20 12/18/20  Nwoko, Terese Door, PA  hydrOXYzine (ATARAX/VISTARIL) 50 MG tablet Take 1 tablet (50 mg total) by mouth 4 (four) times daily. 11/22/20   Derrill Center, NP  LANTUS SOLOSTAR 100 UNIT/ML Solostar Pen Inject 20 Units into the skin at bedtime. 11/12/20   [provider]  lisinopril (ZESTRIL) 20 MG tablet Take 20 mg by mouth  daily.    [provider]  magnesium oxide (MAG-OX) 400 (240 Mg) MG tablet Take 1 tablet (400 mg total) by mouth daily. 10/14/20   Pokhrel, Corrie Mckusick, MD  metFORMIN (GLUCOPHAGE) 1000 MG tablet Take 1,000 mg by mouth 2 (two) times daily with a meal.    [provider]  QUEtiapine (SEROQUEL) 200 MG tablet Take 2 tablets (400 mg total) by mouth in the morning. 11/18/20   Nwoko, Terese Door, PA  QUEtiapine (SEROQUEL) 50 MG tablet Take 1-2 tablets (50-100 mg total) by mouth 3 (three) times daily. 100 mg am, 50 mg noon and 100 mg hs 11/18/20   Nwoko, Isidoro Donning E, PA  traZODone (DESYREL) 100 MG tablet Take 1 tablet (100 mg total) by mouth at bedtime. 11/18/20   Nwoko, Terese Door, PA  zolpidem (AMBIEN) 5 MG tablet Take 5 mg by mouth at bedtime as needed for sleep.    [provider]      Allergies    Cogentin [benztropine], Penicillins, and Prolixin [fluphenazine]    Review of Systems   Review of Systems  Constitutional:  Positive for appetite change. Negative for chills, fatigue and fever.  HENT:  Negative for congestion.   Respiratory:  Negative for shortness of breath.   Cardiovascular:  Negative for chest pain.  Gastrointestinal:  Negative for abdominal distention.  Genitourinary:  Negative for dysuria.  Musculoskeletal:  Positive for gait problem.  Neurological:  Negative for dizziness and syncope.    Physical  Exam Updated Vital Signs BP 115/82 (BP Location: Right Arm)   Pulse 80   Temp 98.2 F (36.8 C) (Oral)   Resp 16   Ht 5' 6"$  (1.676 m)   Wt 70 kg   SpO2 100%   BMI 24.91 kg/m  Physical Exam Vitals and nursing note reviewed.  Constitutional:      General: He is not in acute distress.    Appearance: He is well-developed.  HENT:     Head: Normocephalic and atraumatic.  Eyes:     Conjunctiva/sclera: Conjunctivae normal.  Cardiovascular:     Rate and Rhythm: Normal rate and regular rhythm.     Pulses: Normal pulses.     Heart sounds: No murmur heard.    No  friction rub. No gallop.  Pulmonary:     Effort: Pulmonary effort is normal. No respiratory distress.     Breath sounds: Normal breath sounds.  Abdominal:     Palpations: Abdomen is soft.     Tenderness: There is no abdominal tenderness.  Musculoskeletal:        General: No swelling.     Cervical back: Neck supple.  Skin:    General: Skin is warm and dry.     Capillary Refill: Capillary refill takes less than 2 seconds.  Neurological:     General: No focal deficit present.     Mental Status: He is alert and oriented to person, place, and time. Mental status is at baseline.  Psychiatric:        Mood and Affect: Mood normal.     ED Results / Procedures / Treatments   Labs (all labs ordered are listed, but only abnormal results are displayed) Labs Reviewed  BASIC METABOLIC PANEL - Abnormal; Notable for the following components:      Result Value   Potassium 5.3 (*)    CO2 15 (*)    BUN 46 (*)    Creatinine, Ser 1.69 (*)    GFR, Estimated 46 (*)    Anion gap 18 (*)    All other components within normal limits  CBC - Abnormal; Notable for the following components:   RBC 3.78 (*)    Hemoglobin 10.5 (*)    HCT 32.2 (*)    RDW 17.1 (*)    Platelets 145 (*)    All other components within normal limits  LACTIC ACID, PLASMA  LACTIC ACID, PLASMA  URINALYSIS, ROUTINE W REFLEX MICROSCOPIC  BETA-HYDROXYBUTYRIC ACID  I-STAT VENOUS BLOOD GAS, ED  TROPONIN I (HIGH SENSITIVITY)  TROPONIN I (HIGH SENSITIVITY)    EKG EKG Interpretation  Date/Time:  Thursday July 08 2022 12:53:05 EST Ventricular Rate:  90 PR Interval:  162 QRS Duration: 86 QT Interval:  326 QTC Calculation: 398 R Axis:   42 Text Interpretation: Normal sinus rhythm Normal ECG When compared with ECG of 13-Mar-2022 13:46, PREVIOUS ECG IS PRESENT No acute changes No significant change since last tracing Confirmed by Varney Biles [id number]) on 07/08/2022 3:57:13 PM  Radiology DG Chest 2 View  Result Date:  07/08/2022 CLINICAL DATA:  Weakness.  Smoker. EXAM: CHEST - 2 VIEW COMPARISON:  05/29/2022 FINDINGS: Normal sized heart. Clear lungs. Stable mild peribronchial thickening. Thoracic spine degenerative changes and positional scoliosis. IMPRESSION: No acute abnormality. Stable mild chronic bronchitic changes. Electronically Signed   By: Claudie Revering M.D.   On: 07/08/2022 13:57    Procedures Procedures    Medications Ordered in ED Medications - No data to display  ED Course/ Medical Decision  Making/ A&P                             Medical Decision Making Patient initially well-appearing, presents with weakness as well as states he had a seizure which is not unusual for him.  Initial labs were done in triage.  I personally reviewed and interpreted these labs which were significant for potassium of 5.3 with an anion gap of 18 and a bicarb of 15, patient has a chronically elevated creatinine at 1.69.  Initial troponin 6 and repeat was 5. CBC largely unremarkable with a mild anemia of 10.5.  Patient's labs concerning for possible euglycemic DKA.  Patient given 1 L LR.  I sent an additional lactic acid, beta hydroxybutyrate and VBG which shows lactate of 2.4 pH is normalized to 7.38 with a bicarb of 25.  Beta hydroxybutyrate normal.  Repeat lactic acid after fluids is normal at 1.9.  We also repeated a BMP which shows his lactic acidosis has resolved and his bicarb is normalized.  Patient does not meet criteria for diabetic ketoacidosis.  Acidosis likely secondary to seizure patient had earlier.  Patient states these are similar to his prior and he has these frequently.  I personally reviewed and interpreted patient's EKG which shows normal sinus rhythm with rate 90, PR, QRS, QTc normal, no acute ischemic changes.  I personally reviewed and interpreted patient's chest x-ray which shows no focal consolidations, no pulmonary edema, cardiac silhouette within normal limits, no pneumothorax.  Upon reevaluation  patient states he feels better.  Patient likely had mild dehydration in the setting of seizure, no signs of DKA or euglycemic DKA.  At this time we feel patient is safe for discharge with return precautions.  Recommend return to the ED if he has worsening weakness, seizures that are different from his baseline, or inability tolerate oral intake.  Recommended follow-up with his PCP in 2 days.  Patient was stable at discharge.  Amount and/or Complexity of Data Reviewed External Data Reviewed: labs.    Details: Prior creatinine reviewed, patient frequently has creatinine 1.5-1.6 consistent with CKD. Labs: ordered. Decision-making details documented in ED Course. Radiology: ordered and independent interpretation performed. Decision-making details documented in ED Course. ECG/medicine tests: ordered and independent interpretation performed. Decision-making details documented in ED Course.           Final Clinical Impression(s) / ED Diagnoses Final diagnoses:  None    Rx / DC Orders ED Discharge Orders     None         Jimmie Molly, MD 07/08/22 2020    Varney Biles, MD 07/11/22 2207

## 2022-07-08 NOTE — ED Notes (Signed)
Dr. Kathrynn Humble made aware of lactic acid result.

## 2022-08-05 ENCOUNTER — Telehealth: Payer: Self-pay

## 2022-08-05 ENCOUNTER — Encounter: Payer: Self-pay | Admitting: Neurology

## 2022-08-05 ENCOUNTER — Ambulatory Visit (INDEPENDENT_AMBULATORY_CARE_PROVIDER_SITE_OTHER): Payer: Medicaid Other | Admitting: Neurology

## 2022-08-05 VITALS — BP 100/72 | HR 62

## 2022-08-05 DIAGNOSIS — Z87898 Personal history of other specified conditions: Secondary | ICD-10-CM | POA: Diagnosis not present

## 2022-08-05 DIAGNOSIS — R270 Ataxia, unspecified: Secondary | ICD-10-CM

## 2022-08-05 NOTE — Progress Notes (Signed)
NEUROLOGY CONSULTATION NOTE  Ronald Roman MRN: [id number] DOB: 12-13-1960  Referring provider: [facility] Primary care provider: [facility]  Reason for consult:  seizures   Thank you for your kind referral of Ronald Roman for consultation of the above symptoms. Although his history is well known to you, please allow me to reiterate it for the purpose of our medical record. The patient was accompanied to the clinic by SNF staff Rico Sheehan who also provides collateral information. Records and images were personally reviewed where available.  HISTORY OF PRESENT ILLNESS: This is a 62 year old right-handed man with a history of hypertension, hyperlipidemia, DM2, paranoid schizophrenia, and seizures, presenting to establish care. Records were reviewed, patient is a poor historian and he has only been living at current SNF for the past month. He states seizures started at age 36 and that his mother had seizures. He cannot describe the seizures. Records dating back to 2016 indicate hospital admission for possible seizure. He reported a history of convulsions and was on Depakote. Apparently his residential facility reports at baseline he is disorganized but compliant with medications and responds appropriately. In 01/2015, they could not locate him for an entire day, and when he was found, he was laughing inappropriately. A few hour later, he was found lying on the porch with bowel incontinence, unresponsive. He was treated empirically with antibiotics for meningitis, lumbar puncture was normal. He was on Depakote with a therapeutic level. CK was >2223. EEG normal. In 2021, he had several admissions for altered mental status, MRI brain without contrast in 01/2020 showed periatrial white matter FLAIR hyperintensity mildly progressed and now greater on the right. In 09/2020, he was witnessed to have a seizure-like activity for approximately 15 minutes, then had another seizure with  EMS lasting 4 minutes. Up until 03/2022, he was having increasing behaviors, wandering off in the middle of the night. He was in the ER on 05/29/22 for shaking and difficulty getting food inside his mouth, he was found to have the flu. On 07/08/22, he was in the ER for weakness and reported having a seizure.  He is sitting in a wheelchair today, mumbling answers, sometimes not making sense. He reports dizziness when standing, Rico Sheehan reports he falls back when she tries to stand him up. He feels both feet are weak. He is able to feed and dress himself. He denies any neck pain, points to mid-thoracic area when asked about back pain. He reports difficulty swallowing, food is chopped, no choking.     PAST MEDICAL HISTORY: Past Medical History:  Diagnosis Date   Diabetes mellitus without complication (HCC)    GERD (gastroesophageal reflux disease)    Hyperlipidemia    Hypertension    Paranoid schizophrenia (HCC)    Seizures (HCC)    Sleep apnea    Uric acid nephrolithiasis     PAST SURGICAL HISTORY: History reviewed. No pertinent surgical history.  MEDICATIONS: Current Outpatient Medications on File Prior to Visit  Medication Sig Dispense Refill   amantadine (SYMMETREL) 100 MG capsule Take 100 mg by mouth 2 (two) times daily.     benztropine (COGENTIN) 1 MG tablet Take 1 mg by mouth 2 (two) times daily.     cetirizine (ZYRTEC) 10 MG tablet Take 10 mg by mouth daily.     clozapine (CLOZARIL) 50 MG tablet Take 50 mg by mouth 2 (two) times daily.     dapagliflozin propanediol (FARXIGA) 10 MG TABS tablet Take 10 mg by  mouth daily after breakfast.     divalproex (DEPAKOTE ER) 500 MG 24 hr tablet Take 500 mg by mouth 2 (two) times daily.     ergocalciferol (VITAMIN D2) 1.25 MG (50000 UT) capsule Take 50,000 Units by mouth once a week.     hydrOXYzine (ATARAX) 25 MG tablet Take 25 mg by mouth 2 (two) times daily as needed for anxiety.     hydrOXYzine (ATARAX/VISTARIL) 50 MG tablet Take 1 tablet  (50 mg total) by mouth 4 (four) times daily. 60 tablet 0   levETIRAcetam (KEPPRA) 500 MG tablet Take 500 mg by mouth 2 (two) times daily.     lisinopril (ZESTRIL) 10 MG tablet Take 10 mg by mouth daily.     magnesium oxide (MAG-OX) 400 (240 Mg) MG tablet Take 1 tablet (400 mg total) by mouth daily. 30 tablet 0   metFORMIN (GLUCOPHAGE) 1000 MG tablet Take 1,000 mg by mouth 2 (two) times daily with a meal.     mirtazapine (REMERON SOL-TAB) 15 MG disintegrating tablet Take 15 mg by mouth at bedtime.     QUEtiapine (SEROQUEL) 200 MG tablet Take 2 tablets (400 mg total) by mouth in the morning. 60 tablet 1   QUEtiapine (SEROQUEL) 50 MG tablet Take 1-2 tablets (50-100 mg total) by mouth 3 (three) times daily. 100 mg am, 50 mg noon and 100 mg hs 150 tablet 1   traZODone (DESYREL) 100 MG tablet Take 1 tablet (100 mg total) by mouth at bedtime. 30 tablet 1   zolpidem (AMBIEN) 5 MG tablet Take 5 mg by mouth at bedtime as needed for sleep.     No current facility-administered medications on file prior to visit.    ALLERGIES: Allergies  Allergen Reactions   Cogentin [Benztropine] Other (See Comments)    Per MAR    Penicillins Other (See Comments)    Per MAR - unable to verify patients PCN reaction    Prolixin [Fluphenazine] Other (See Comments)    Per MAR     FAMILY HISTORY: History reviewed. No pertinent family history.  SOCIAL HISTORY: Social History   Socioeconomic History   Marital status: Single    Spouse name: Not on file   Number of children: Not on file   Years of education: Not on file   Highest education level: Not on file  Occupational History   Not on file  Tobacco Use   Smoking status: Former    Packs/day: 1    Types: Cigarettes   Smokeless tobacco: Never  Vaping Use   Vaping Use: Never used  Substance and Sexual Activity   Alcohol use: No   Drug use: No   Sexual activity: Not Currently  Other Topics Concern   Not on file  Social History Narrative   Lives at  [facility]   Right handed    Social Determinants of Health   Financial Resource Strain: Not on file  Food Insecurity: Not on file  Transportation Needs: Not on file  Physical Activity: Not on file  Stress: Not on file  Social Connections: Not on file  Intimate Partner Violence: Not on file     PHYSICAL EXAM: Vitals:   08/05/22 1043  BP: 100/72  Pulse: 62  SpO2: 97%   General: No acute distress, sitting on wheelchair Head:  Normocephalic/atraumatic Skin/Extremities: No rash, no edema Neurological Exam: Mental status: alert and awake. States it is the end of February. He is dysarthric, at times difficulty to understand. He has difficulty  following commands.  Cranial nerves: CN I: not tested CN II: pupils equal, round, visual fields intact CN III, IV, VI:  full range of motion, no nystagmus, no ptosis CN VII: upper and lower face symmetric CN VIII: hearing intact to conversation Bulk & Tone: normal, no fasciculations. Motor: 5/5 throughout with no pronator drift. Sensation: unable to assess Deep Tendon Reflexes: unable to elicit Cerebellar: no incoordination on finger to nose appears intact, possibly slightly off point but not clearly ataxic Gait: he is quite ataxic, unable to stand or take steps without 2-person assist Tremor: none   IMPRESSION: This is a 62 year old right-handed man with a history of hypertension, hyperlipidemia, DM2, paranoid schizophrenia, and seizures, presenting to establish care. He is a poor historian, review of records indicate a history of convulsions. EEG normal. Review of records indicate behavioral changes where he would be wandering off in the middle of the night up until 03/2022. Today however he is quite ataxic, unable to stand. Staff reports he has been living at their facility for a month and she has noticed he needs assistance with standing. He is also dysarthric and at times has nonsensical speech/difficulty following  commands. MRI brain with and without contrast and EEG will be ordered. Continue all medications, he is on Depakote 500mg  BID and Levetiracetam 500mg  BID. Follow-up in 3 months, call for any changes.    Thank you for allowing me to participate in the care of this patient. Please do not hesitate to call for any questions or concerns.   Ellouise Newer, M.D.  CC: [facility]

## 2022-08-05 NOTE — Telephone Encounter (Signed)
Pt guardian called informed that MRI is scheduled at [place] for march 25th at 11 am he has to be there at 10:30

## 2022-08-05 NOTE — Patient Instructions (Addendum)
Good to meet you.  Schedule MRI brain with and without contrast  2. Schedule EEG  3. Continue all your medications  4. Patient cannot ambulate, recommend wheelchair  5. Follow-up in 3 months, call for any changes

## 2022-08-12 ENCOUNTER — Ambulatory Visit (INDEPENDENT_AMBULATORY_CARE_PROVIDER_SITE_OTHER): Payer: Medicaid Other | Admitting: Neurology

## 2022-08-12 DIAGNOSIS — R270 Ataxia, unspecified: Secondary | ICD-10-CM

## 2022-08-12 DIAGNOSIS — Z87898 Personal history of other specified conditions: Secondary | ICD-10-CM | POA: Diagnosis not present

## 2022-08-12 NOTE — Progress Notes (Signed)
EEG complete - results pending 

## 2022-08-15 ENCOUNTER — Inpatient Hospital Stay (HOSPITAL_COMMUNITY)
Admission: EM | Admit: 2022-08-15 | Discharge: 2022-08-23 | DRG: 388 | Disposition: A | Discharge status: Skilled Nursing Facility | Payer: Medicaid Other | Attending: Internal Medicine | Admitting: Internal Medicine

## 2022-08-15 ENCOUNTER — Emergency Department (HOSPITAL_COMMUNITY): Payer: Medicaid Other

## 2022-08-15 ENCOUNTER — Other Ambulatory Visit: Payer: Self-pay

## 2022-08-15 ENCOUNTER — Encounter (HOSPITAL_COMMUNITY): Payer: Self-pay

## 2022-08-15 DIAGNOSIS — I9589 Other hypotension: Secondary | ICD-10-CM | POA: Diagnosis present

## 2022-08-15 DIAGNOSIS — R4182 Altered mental status, unspecified: Secondary | ICD-10-CM

## 2022-08-15 DIAGNOSIS — E876 Hypokalemia: Secondary | ICD-10-CM | POA: Diagnosis not present

## 2022-08-15 DIAGNOSIS — G9341 Metabolic encephalopathy: Secondary | ICD-10-CM | POA: Diagnosis present

## 2022-08-15 DIAGNOSIS — A419 Sepsis, unspecified organism: Secondary | ICD-10-CM | POA: Diagnosis not present

## 2022-08-15 DIAGNOSIS — K920 Hematemesis: Secondary | ICD-10-CM | POA: Diagnosis present

## 2022-08-15 DIAGNOSIS — Z781 Physical restraint status: Secondary | ICD-10-CM | POA: Diagnosis not present

## 2022-08-15 DIAGNOSIS — E119 Type 2 diabetes mellitus without complications: Secondary | ICD-10-CM | POA: Diagnosis not present

## 2022-08-15 DIAGNOSIS — E872 Acidosis, unspecified: Secondary | ICD-10-CM | POA: Diagnosis present

## 2022-08-15 DIAGNOSIS — E785 Hyperlipidemia, unspecified: Secondary | ICD-10-CM | POA: Diagnosis present

## 2022-08-15 DIAGNOSIS — Z7984 Long term (current) use of oral hypoglycemic drugs: Secondary | ICD-10-CM

## 2022-08-15 DIAGNOSIS — E861 Hypovolemia: Secondary | ICD-10-CM | POA: Diagnosis present

## 2022-08-15 DIAGNOSIS — R569 Unspecified convulsions: Secondary | ICD-10-CM | POA: Diagnosis present

## 2022-08-15 DIAGNOSIS — Z87891 Personal history of nicotine dependence: Secondary | ICD-10-CM

## 2022-08-15 DIAGNOSIS — N179 Acute kidney failure, unspecified: Secondary | ICD-10-CM | POA: Diagnosis present

## 2022-08-15 DIAGNOSIS — E1165 Type 2 diabetes mellitus with hyperglycemia: Secondary | ICD-10-CM | POA: Diagnosis present

## 2022-08-15 DIAGNOSIS — F5105 Insomnia due to other mental disorder: Secondary | ICD-10-CM

## 2022-08-15 DIAGNOSIS — Z1152 Encounter for screening for COVID-19: Secondary | ICD-10-CM

## 2022-08-15 DIAGNOSIS — I10 Essential (primary) hypertension: Secondary | ICD-10-CM | POA: Diagnosis present

## 2022-08-15 DIAGNOSIS — K2100 Gastro-esophageal reflux disease with esophagitis, without bleeding: Secondary | ICD-10-CM | POA: Diagnosis present

## 2022-08-15 DIAGNOSIS — J690 Pneumonitis due to inhalation of food and vomit: Secondary | ICD-10-CM | POA: Diagnosis present

## 2022-08-15 DIAGNOSIS — Z79899 Other long term (current) drug therapy: Secondary | ICD-10-CM

## 2022-08-15 DIAGNOSIS — K56600 Partial intestinal obstruction, unspecified as to cause: Secondary | ICD-10-CM | POA: Diagnosis present

## 2022-08-15 DIAGNOSIS — E7220 Disorder of urea cycle metabolism, unspecified: Secondary | ICD-10-CM | POA: Diagnosis present

## 2022-08-15 DIAGNOSIS — Z880 Allergy status to penicillin: Secondary | ICD-10-CM

## 2022-08-15 DIAGNOSIS — K5900 Constipation, unspecified: Secondary | ICD-10-CM

## 2022-08-15 DIAGNOSIS — R933 Abnormal findings on diagnostic imaging of other parts of digestive tract: Secondary | ICD-10-CM

## 2022-08-15 DIAGNOSIS — I2510 Atherosclerotic heart disease of native coronary artery without angina pectoris: Secondary | ICD-10-CM | POA: Diagnosis present

## 2022-08-15 DIAGNOSIS — F200 Paranoid schizophrenia: Secondary | ICD-10-CM | POA: Diagnosis present

## 2022-08-15 DIAGNOSIS — E875 Hyperkalemia: Secondary | ICD-10-CM | POA: Diagnosis present

## 2022-08-15 DIAGNOSIS — D649 Anemia, unspecified: Secondary | ICD-10-CM | POA: Diagnosis present

## 2022-08-15 DIAGNOSIS — K567 Ileus, unspecified: Secondary | ICD-10-CM | POA: Diagnosis present

## 2022-08-15 DIAGNOSIS — F209 Schizophrenia, unspecified: Secondary | ICD-10-CM | POA: Diagnosis not present

## 2022-08-15 DIAGNOSIS — F339 Major depressive disorder, recurrent, unspecified: Secondary | ICD-10-CM

## 2022-08-15 DIAGNOSIS — Z888 Allergy status to other drugs, medicaments and biological substances status: Secondary | ICD-10-CM

## 2022-08-15 LAB — POCT I-STAT, CHEM 8
BUN: 72 mg/dL — ABNORMAL HIGH (ref 8–23)
Calcium, Ion: 1.06 mmol/L — ABNORMAL LOW (ref 1.15–1.40)
Chloride: 109 mmol/L (ref 98–111)
Creatinine, Ser: 3 mg/dL — ABNORMAL HIGH (ref 0.61–1.24)
Glucose, Bld: 96 mg/dL (ref 70–99)
HCT: 35 % — ABNORMAL LOW (ref 39.0–52.0)
Hemoglobin: 11.9 g/dL — ABNORMAL LOW (ref 13.0–17.0)
Potassium: 7.6 mmol/L (ref 3.5–5.1)
Sodium: 138 mmol/L (ref 135–145)
TCO2: 15 mmol/L — ABNORMAL LOW (ref 22–32)

## 2022-08-15 LAB — CBC WITH DIFFERENTIAL/PLATELET
Abs Immature Granulocytes: 0.04 10*3/uL (ref 0.00–0.07)
Basophils Absolute: 0 10*3/uL (ref 0.0–0.1)
Basophils Relative: 0 %
Eosinophils Absolute: 0 10*3/uL (ref 0.0–0.5)
Eosinophils Relative: 0 %
HCT: 26.2 % — ABNORMAL LOW (ref 39.0–52.0)
Hemoglobin: 7.9 g/dL — ABNORMAL LOW (ref 13.0–17.0)
Immature Granulocytes: 1 %
Lymphocytes Relative: 8 %
Lymphs Abs: 0.6 10*3/uL — ABNORMAL LOW (ref 0.7–4.0)
MCH: 27.9 pg (ref 26.0–34.0)
MCHC: 30.2 g/dL (ref 30.0–36.0)
MCV: 92.6 fL (ref 80.0–100.0)
Monocytes Absolute: 0.5 10*3/uL (ref 0.1–1.0)
Monocytes Relative: 7 %
Neutro Abs: 6.5 10*3/uL (ref 1.7–7.7)
Neutrophils Relative %: 84 %
Platelets: 156 10*3/uL (ref 150–400)
RBC: 2.83 MIL/uL — ABNORMAL LOW (ref 4.22–5.81)
RDW: 15.5 % (ref 11.5–15.5)
WBC: 7.7 10*3/uL (ref 4.0–10.5)
nRBC: 0 % (ref 0.0–0.2)

## 2022-08-15 LAB — COMPREHENSIVE METABOLIC PANEL
ALT: 10 U/L (ref 0–44)
AST: 26 U/L (ref 15–41)
Albumin: 3.4 g/dL — ABNORMAL LOW (ref 3.5–5.0)
Alkaline Phosphatase: 62 U/L (ref 38–126)
Anion gap: 19 — ABNORMAL HIGH (ref 5–15)
BUN: 58 mg/dL — ABNORMAL HIGH (ref 8–23)
CO2: 12 mmol/L — ABNORMAL LOW (ref 22–32)
Calcium: 9.7 mg/dL (ref 8.9–10.3)
Chloride: 105 mmol/L (ref 98–111)
Creatinine, Ser: 2.83 mg/dL — ABNORMAL HIGH (ref 0.61–1.24)
GFR, Estimated: 25 mL/min — ABNORMAL LOW (ref >60)
Glucose, Bld: 96 mg/dL (ref 70–99)
Potassium: 7.4 mmol/L (ref 3.5–5.1)
Sodium: 136 mmol/L (ref 135–145)
Total Bilirubin: 0.9 mg/dL (ref 0.3–1.2)
Total Protein: 6.9 g/dL (ref 6.5–8.1)

## 2022-08-15 LAB — RESP PANEL BY RT-PCR (RSV, FLU A&B, COVID)  RVPGX2
Influenza A by PCR: NEGATIVE
Influenza B by PCR: NEGATIVE
Resp Syncytial Virus by PCR: NEGATIVE
SARS Coronavirus 2 by RT PCR: NEGATIVE

## 2022-08-15 LAB — URINALYSIS, W/ REFLEX TO CULTURE (INFECTION SUSPECTED)
Bacteria, UA: NONE SEEN
Bilirubin Urine: NEGATIVE
Glucose, UA: 150 mg/dL — AB
Hgb urine dipstick: NEGATIVE
Ketones, ur: 5 mg/dL — AB
Leukocytes,Ua: NEGATIVE
Nitrite: NEGATIVE
Protein, ur: NEGATIVE mg/dL
Specific Gravity, Urine: 1.015 (ref 1.005–1.030)
pH: 5 (ref 5.0–8.0)

## 2022-08-15 LAB — I-STAT CHEM 8, ED
BUN: 72 mg/dL — ABNORMAL HIGH (ref 8–23)
BUN: 46 mg/dL — ABNORMAL HIGH (ref 8–23)
Calcium, Ion: 1.06 mmol/L — ABNORMAL LOW (ref 1.15–1.40)
Calcium, Ion: 1.19 mmol/L (ref 1.15–1.40)
Chloride: 109 mmol/L (ref 98–111)
Chloride: 110 mmol/L (ref 98–111)
Creatinine, Ser: 3 mg/dL — ABNORMAL HIGH (ref 0.61–1.24)
Creatinine, Ser: 2.4 mg/dL — ABNORMAL HIGH (ref 0.61–1.24)
Glucose, Bld: 96 mg/dL (ref 70–99)
Glucose, Bld: 90 mg/dL (ref 70–99)
HCT: 35 % — ABNORMAL LOW (ref 39.0–52.0)
HCT: 25 % — ABNORMAL LOW (ref 39.0–52.0)
Hemoglobin: 11.9 g/dL — ABNORMAL LOW (ref 13.0–17.0)
Hemoglobin: 8.5 g/dL — ABNORMAL LOW (ref 13.0–17.0)
Potassium: 7.6 mmol/L (ref 3.5–5.1)
Potassium: 5 mmol/L (ref 3.5–5.1)
Sodium: 138 mmol/L (ref 135–145)
Sodium: 142 mmol/L (ref 135–145)
TCO2: 15 mmol/L — ABNORMAL LOW (ref 22–32)
TCO2: 16 mmol/L — ABNORMAL LOW (ref 22–32)

## 2022-08-15 LAB — HEMOGLOBIN AND HEMATOCRIT, BLOOD
HCT: 30 % — ABNORMAL LOW (ref 39.0–52.0)
Hemoglobin: 9.8 g/dL — ABNORMAL LOW (ref 13.0–17.0)

## 2022-08-15 LAB — TYPE AND SCREEN
ABO/RH(D): A POS
Antibody Screen: NEGATIVE

## 2022-08-15 LAB — PROTIME-INR
INR: 1.1 (ref 0.8–1.2)
Prothrombin Time: 14.4 seconds (ref 11.4–15.2)

## 2022-08-15 LAB — ABO/RH: ABO/RH(D): A POS

## 2022-08-15 LAB — APTT: aPTT: 25 seconds (ref 24–36)

## 2022-08-15 LAB — POTASSIUM: Potassium: 5.5 mmol/L — ABNORMAL HIGH (ref 3.5–5.1)

## 2022-08-15 LAB — LACTIC ACID, PLASMA
Lactic Acid, Venous: 7.3 mmol/L (ref 0.5–1.9)
Lactic Acid, Venous: 4.8 mmol/L (ref 0.5–1.9)

## 2022-08-15 LAB — AMMONIA: Ammonia: 200 umol/L — ABNORMAL HIGH (ref 9–35)

## 2022-08-15 MED ORDER — SODIUM CHLORIDE 0.9 % IV SOLN: 2.0000 g | Freq: Once | INTRAVENOUS | Status: DC

## 2022-08-15 MED ORDER — VANCOMYCIN HCL IN DEXTROSE 1-5 GM/200ML-% IV SOLN
1000.0000 mg | Freq: Once | INTRAVENOUS | Status: AC
  Administered 2022-08-15: 1000 mg via INTRAVENOUS
  Filled 2022-08-15: qty 200

## 2022-08-15 MED ORDER — ORAL CARE MOUTH RINSE: 15.0000 mL | OROMUCOSAL | Status: DC | PRN

## 2022-08-15 MED ORDER — INSULIN ASPART 100 UNIT/ML IV SOLN: 5.0000 [IU] | Freq: Once | INTRAVENOUS | Status: DC

## 2022-08-15 MED ORDER — SODIUM CHLORIDE 0.9 % IV SOLN
2.0000 g | Freq: Once | INTRAVENOUS | Status: AC
  Administered 2022-08-15: 2 g via INTRAVENOUS
  Filled 2022-08-15: qty 12.5

## 2022-08-15 MED ORDER — LACTATED RINGERS IV BOLUS (SEPSIS)
1000.0000 mL | Freq: Once | INTRAVENOUS | Status: AC
  Administered 2022-08-15: 1000 mL via INTRAVENOUS

## 2022-08-15 MED ORDER — POLYETHYLENE GLYCOL 3350 17 GM/SCOOP PO POWD
1.0000 | Freq: Once | ORAL | Status: AC
  Administered 2022-08-15: 255 g via ORAL
  Filled 2022-08-15: qty 255

## 2022-08-15 MED ORDER — PANTOPRAZOLE SODIUM 40 MG IV SOLR
40.0000 mg | Freq: Once | INTRAVENOUS | Status: AC
  Administered 2022-08-15: 40 mg via INTRAVENOUS
  Filled 2022-08-15: qty 10

## 2022-08-15 MED ORDER — LACTATED RINGERS IV BOLUS (SEPSIS)
250.0000 mL | Freq: Once | INTRAVENOUS | Status: AC
  Administered 2022-08-15: 250 mL via INTRAVENOUS

## 2022-08-15 MED ORDER — LACTATED RINGERS IV SOLN: INTRAVENOUS | Status: AC

## 2022-08-15 MED ORDER — PANTOPRAZOLE SODIUM 40 MG IV SOLR
40.0000 mg | Freq: Two times a day (BID) | INTRAVENOUS | Status: DC
  Administered 2022-08-15 – 2022-08-21 (×12): 40 mg via INTRAVENOUS
  Filled 2022-08-15 (×12): qty 10

## 2022-08-15 MED ORDER — DEXTROSE 50 % IV SOLN: 1.0000 | Freq: Once | INTRAVENOUS | Status: DC

## 2022-08-15 MED ORDER — LEVETIRACETAM IN NACL 500 MG/100ML IV SOLN
500.0000 mg | Freq: Two times a day (BID) | INTRAVENOUS | Status: DC
  Administered 2022-08-15 – 2022-08-23 (×17): 500 mg via INTRAVENOUS
  Filled 2022-08-15 (×17): qty 100

## 2022-08-15 MED ORDER — CHLORHEXIDINE GLUCONATE CLOTH 2 % EX PADS: 6.0000 | MEDICATED_PAD | Freq: Every day | CUTANEOUS | Status: DC

## 2022-08-15 MED ORDER — CHLORHEXIDINE GLUCONATE CLOTH 2 % EX PADS
6.0000 | MEDICATED_PAD | Freq: Every day | CUTANEOUS | Status: DC
  Administered 2022-08-16: 6 via TOPICAL

## 2022-08-15 MED ORDER — ORAL CARE MOUTH RINSE
15.0000 mL | OROMUCOSAL | Status: DC
  Administered 2022-08-15 – 2022-08-19 (×18): 15 mL via OROMUCOSAL

## 2022-08-15 MED ORDER — SODIUM CHLORIDE 0.9 % IV BOLUS
1000.0000 mL | Freq: Once | INTRAVENOUS | Status: AC
  Administered 2022-08-15: 1000 mL via INTRAVENOUS

## 2022-08-15 MED ORDER — METRONIDAZOLE 500 MG/100ML IV SOLN
500.0000 mg | Freq: Once | INTRAVENOUS | Status: AC
  Administered 2022-08-15: 500 mg via INTRAVENOUS
  Filled 2022-08-15: qty 100

## 2022-08-15 MED ORDER — SORBITOL 70 % SOLN
300.0000 mL | TOPICAL_OIL | Freq: Once | ORAL | Status: AC
  Administered 2022-08-15: 300 mL via RECTAL
  Filled 2022-08-15: qty 90

## 2022-08-15 NOTE — H&P (Signed)
NAME:  ZAYNE GNAGEY, MRN:  [id number], DOB:  06-Dec-1960, LOS: 0 ADMISSION DATE:  08/15/2022, CONSULTATION DATE:  08/15/2022 REFERRING MD:  Dr. Truett Mainland, ER, CHIEF COMPLAINT:  Emesis  History of Present Illness:  62 yo male ward of the state from [facility] facility with coffee ground emesis and abdominal distention.  Potassium 7.4, AKI and low BP.  CT abd/pelvis showed patchy infiltrate at bases concerning for aspiration pneumonitis, prominent small/large bowel with stool concerning for ileus.    Pertinent  Medical History  DM type 2, GERD, HLD, HTN, Schizophrenia, Seizures, OSA, Nephrolithiasis  Significant Hospital Events: Including procedures, antibiotic start and stop dates in addition to other pertinent events   3/24 admit  Interim History / Subjective:  No longer feeling nauseous.  Feels bloated.  Objective   Blood pressure 109/70, pulse 88, temperature (!) 97.4 F (36.3 C), temperature source Oral, resp. rate 16, height 5\' 6"  (1.676 m), weight 70 kg, SpO2 100 %.        Intake/Output Summary (Last 24 hours) at 08/15/2022 1027 Last data filed at 08/15/2022 0944 Gross per 24 hour  Intake 3650 ml  Output --  Net 3650 ml   Filed Weights   08/15/22 0631  Weight: 70 kg    Examination:  General - sleepy Eyes - pupils reactive ENT - no sinus tenderness, no stridor Cardiac - regular rate/rhythm, no murmur Chest - scattered rhonchi Abdomen - distended, mild tenderness, decreased bowel sounds, increased tympany Extremities - no cyanosis, clubbing, or edema Skin - no rashes Neuro - wakes up easily, follows commands  Resolved Hospital Problem list     Assessment & Plan:   Hypotension from hypovolemia. - continue IV fluids  Abdominal distention from constipation and ileus. Nausea with vomiting coffee ground material concerning for upper GI bleed. - correct electrolytes - bowel regimen - GI consulted by EDP - protonix bid  AKI from  hypovolemia. Hyperkalemia. - baseline creatinine 1.54 - continue IV fluids - f/u BMET, monitor urine outpt  Hx of schizophrenia, seizures. - hold outpt amantadine, cogentin, clozaril, depakote, seroquel, trazodone, ambien, remeron - keppra IV for now  DM type 2 poorly controlled with hyperglycemia. - SSI - hold outpt farxiga, metformin  Hx of HTN. - hold outpt lisinopril  Concern for sepsis. - seems less likely - d/c ABX  Best Practice (right click and "Reselect all SmartList Selections" daily)   Diet/type: NPO DVT prophylaxis: SCD GI prophylaxis: PPI Lines: Central line Foley:  N/A Code Status:  full code Last date of multidisciplinary goals of care discussion [x]   Labs   CBC: Recent Labs  Lab 08/15/22 0652 08/15/22 0830 08/15/22 0938  WBC  --  7.7  --   NEUTROABS  --  6.5  --   HGB 11.9* 7.9* 8.5*  HCT 35.0* 26.2* 25.0*  MCV  --  92.6  --   PLT  --  156  --     Basic Metabolic Panel: Recent Labs  Lab 08/15/22 0640 08/15/22 0652 08/15/22 0804 08/15/22 0938  NA 136 138  --  142  K 7.4* 7.6* 5.5* 5.0  CL 105 109  --  110  CO2 12*  --   --   --   GLUCOSE 96 96  --  90  BUN 58* 72*  --  46*  CREATININE 2.83* 3.00*  --  2.40*  CALCIUM 9.7  --   --   --    GFR: Estimated Creatinine Clearance: 29.2 mL/min (A) (by  C-G formula based on SCr of 2.4 mg/dL (H)). Recent Labs  Lab 08/15/22 0736 08/15/22 0830  WBC  --  7.7  LATICACIDVEN 7.3*  --     Liver Function Tests: Recent Labs  Lab 08/15/22 0640  AST 26  ALT 10  ALKPHOS 62  BILITOT 0.9  PROT 6.9  ALBUMIN 3.4*   No results for input(s): "LIPASE", "AMYLASE" in the last 168 hours. Recent Labs  Lab 08/15/22 0640  AMMONIA 200*    ABG    Component Value Date/Time   PHART 7.325 (L) 05/28/2015 1427   PCO2ART 41.2 05/28/2015 1427   PO2ART 44.4 (L) 05/28/2015 1427   HCO3 25.1 07/08/2022 1636   TCO2 16 (L) 08/15/2022 0938   ACIDBASEDEF 2.7 (H) 10/11/2020 2304   O2SAT 59 07/08/2022 1636      Coagulation Profile: Recent Labs  Lab 08/15/22 0736  INR 1.1    Cardiac Enzymes: No results for input(s): "CKTOTAL", "CKMB", "CKMBINDEX", "TROPONINI" in the last 168 hours.  HbA1C: Hgb A1c MFr Bld  Date/Time Value Ref Range Status  11/13/2020 09:38 PM 5.3 4.8 - 5.6 % Final    Comment:    (NOTE) Pre diabetes:          5.7%-6.4%  Diabetes:              >6.4%  Glycemic control for   <7.0% adults with diabetes   10/25/2020 09:20 AM 5.3 4.8 - 5.6 % Final    Comment:    (NOTE)         Prediabetes: 5.7 - 6.4         Diabetes: >6.4         Glycemic control for adults with diabetes: <7.0     CBG: No results for input(s): "GLUCAP" in the last 168 hours.  Review of Systems:   Reviewed and negative  Past Medical History:  He,  has a past medical history of Diabetes mellitus without complication ([place]), GERD (gastroesophageal reflux disease), Hyperlipidemia, Hypertension, Paranoid schizophrenia ([place]), Seizures ([place]), Sleep apnea, and Uric acid nephrolithiasis.   Surgical History:  History reviewed. No pertinent surgical history.   Social History:   reports that he has quit smoking. His smoking use included cigarettes. He smoked an average of 1 pack per day. He has never used smokeless tobacco. He reports that he does not drink alcohol and does not use drugs.   Family History:  His family history is not on file.   Allergies Allergies  Allergen Reactions   Cogentin [Benztropine] Other (See Comments)    Per MAR    Penicillins Other (See Comments)    Per MAR - unable to verify patients PCN reaction    Prolixin [Fluphenazine] Other (See Comments)    Per MAR      Home Medications  Prior to Admission medications   Medication Sig Start Date End Date Taking? Authorizing Provider  amantadine (SYMMETREL) 100 MG capsule Take 100 mg by mouth 2 (two) times daily.   Yes [provider]  benztropine (COGENTIN) 1 MG tablet Take 1 mg by mouth 2 (two) times daily.   Yes  [provider]  cetirizine (ZYRTEC) 10 MG tablet Take 10 mg by mouth daily.   Yes [provider]  clozapine (CLOZARIL) 50 MG tablet Take 50 mg by mouth 2 (two) times daily.   Yes [provider]  dapagliflozin propanediol (FARXIGA) 10 MG TABS tablet Take 10 mg by mouth daily after breakfast.   Yes [provider]  divalproex (DEPAKOTE ER) 500 MG 24 hr tablet Take 500 mg by mouth 2 (two) times daily.   Yes [provider]  hydrOXYzine (ATARAX) 25 MG tablet Take 25 mg by mouth 2 (two) times daily as needed for anxiety.   Yes [provider]  levETIRAcetam (KEPPRA) 500 MG tablet Take 500 mg by mouth 2 (two) times daily.   Yes [provider]  lisinopril (ZESTRIL) 10 MG tablet Take 10 mg by mouth daily.   Yes [provider]  magnesium oxide (MAG-OX) 400 (240 Mg) MG tablet Take 1 tablet (400 mg total) by mouth daily. 10/14/20  Yes Pokhrel, L[place], MD  metFORMIN (GLUCOPHAGE) 1000 MG tablet Take 1,000 mg by mouth 2 (two) times daily with a meal.   Yes [provider]  mirtazapine (REMERON SOL-TAB) 15 MG disintegrating tablet Take 15 mg by mouth at bedtime.   Yes [provider]  QUEtiapine (SEROQUEL) 200 MG tablet Take 2 tablets (400 mg total) by mouth in the morning. Patient taking differently: Take 200 mg by mouth at bedtime. 11/18/20  Yes Nwoko, Terese Door, PA  QUEtiapine (SEROQUEL) 50 MG tablet Take 1-2 tablets (50-100 mg total) by mouth 3 (three) times daily. 100 mg am, 50 mg noon and 100 mg hs Patient taking differently: Take 50 mg by mouth See admin instructions. Take 50 mg (1 tablet) by mouth every morning and at 3 pm. 11/18/20  Yes Nwoko, Isidoro Donning E, PA  traZODone (DESYREL) 100 MG tablet Take 1 tablet (100 mg total) by mouth at bedtime. 11/18/20  Yes Nwoko, Uchenna E, PA  zolpidem (AMBIEN) 5 MG tablet Take 5 mg by mouth at bedtime as needed for sleep.   Yes [provider]     Signature:  Chesley Mires, MD [place] Pager - [phone] or [phone] 08/15/2022, 10:40 AM

## 2022-08-15 NOTE — ED Notes (Signed)
CBG: 88 mg/dL 

## 2022-08-15 NOTE — ED Provider Notes (Signed)
[facility] Provider Note   CSN: [id number] Arrival date & time: 08/15/22  [id number]     History {Add pertinent medical, surgical, social history, OB history to HPI:1} No chief complaint on file.   Ronald Roman is a 62 y.o. male brought in by EMS from skilled nursing facility for altered mental status and vomiting black blood.  He has a past medical history of schizophrenia, diabetes, hypertension, and metabolic encephalopathy.  Per signout from Dr. Ayesha Rumpf is normally alert and ambulatory.  According to EMS patient was vomiting black emesis last night.  EMS reports hypotension, no tachycardia no reported trauma and normal CBG.  Patient unable to provide history otherwise due to acuity of condition and mental status  HPI     Home Medications Prior to Admission medications   Medication Sig Start Date End Date Taking? Authorizing Provider  amantadine (SYMMETREL) 100 MG capsule Take 100 mg by mouth 2 (two) times daily.    [provider]  benztropine (COGENTIN) 1 MG tablet Take 1 mg by mouth 2 (two) times daily.    [provider]  cetirizine (ZYRTEC) 10 MG tablet Take 10 mg by mouth daily.    [provider]  clozapine (CLOZARIL) 50 MG tablet Take 50 mg by mouth 2 (two) times daily.    [provider]  dapagliflozin propanediol (FARXIGA) 10 MG TABS tablet Take 10 mg by mouth daily after breakfast.    [provider]  divalproex (DEPAKOTE ER) 500 MG 24 hr tablet Take 500 mg by mouth 2 (two) times daily.    [provider]  ergocalciferol (VITAMIN D2) 1.25 MG (50000 UT) capsule Take 50,000 Units by mouth once a week.    [provider]  hydrOXYzine (ATARAX) 25 MG tablet Take 25 mg by mouth 2 (two) times daily as needed for anxiety.    [provider]  hydrOXYzine (ATARAX/VISTARIL) 50 MG tablet Take 1 tablet (50 mg total) by mouth 4 (four) times daily. 11/22/20   Derrill Center,  NP  levETIRAcetam (KEPPRA) 500 MG tablet Take 500 mg by mouth 2 (two) times daily.    [provider]  lisinopril (ZESTRIL) 10 MG tablet Take 10 mg by mouth daily.    [provider]  magnesium oxide (MAG-OX) 400 (240 Mg) MG tablet Take 1 tablet (400 mg total) by mouth daily. 10/14/20   Pokhrel, Corrie Mckusick, MD  metFORMIN (GLUCOPHAGE) 1000 MG tablet Take 1,000 mg by mouth 2 (two) times daily with a meal.    [provider]  mirtazapine (REMERON SOL-TAB) 15 MG disintegrating tablet Take 15 mg by mouth at bedtime.    [provider]  QUEtiapine (SEROQUEL) 200 MG tablet Take 2 tablets (400 mg total) by mouth in the morning. 11/18/20   Nwoko, Terese Door, PA  QUEtiapine (SEROQUEL) 50 MG tablet Take 1-2 tablets (50-100 mg total) by mouth 3 (three) times daily. 100 mg am, 50 mg noon and 100 mg hs 11/18/20   Nwoko, Isidoro Donning E, PA  traZODone (DESYREL) 100 MG tablet Take 1 tablet (100 mg total) by mouth at bedtime. 11/18/20   Nwoko, Terese Door, PA  zolpidem (AMBIEN) 5 MG tablet Take 5 mg by mouth at bedtime as needed for sleep.    [provider]      Allergies    Cogentin [benztropine], Penicillins, and Prolixin [fluphenazine]    Review of Systems   Review of Systems  Physical Exam Updated Vital Signs BP  120/66   Pulse 85   Temp (!) 95.8 F (35.4 C) (Rectal)   Resp 17   Ht 5\' 6"  (1.676 m)   Wt 70 kg   BMI 24.91 kg/m  Physical Exam Vitals and nursing note reviewed. Exam conducted with a chaperone present.  Constitutional:      Appearance: He is ill-appearing and toxic-appearing.  HENT:     Head: Normocephalic and atraumatic.     Mouth/Throat:     Mouth: Mucous membranes are dry.  Eyes:     Extraocular Movements: Extraocular movements intact.     Pupils: Pupils are equal, round, and reactive to light.  Cardiovascular:     Rate and Rhythm: Normal rate.     Pulses: Normal pulses.  Pulmonary:     Effort: Pulmonary effort is normal.     Breath sounds:  Normal breath sounds. No rhonchi.  Abdominal:     General: There is distension.     Comments: Distended, tympanic abdomen  Genitourinary:    Penis: Normal and circumcised. No swelling.   Musculoskeletal:     Cervical back: Normal range of motion and neck supple.  Skin:    Findings: No rash or wound.  Neurological:     GCS: GCS eye subscore is 3. GCS verbal subscore is 1. GCS motor subscore is 5.     ED Results / Procedures / Treatments   Labs (all labs ordered are listed, but only abnormal results are displayed) Labs Reviewed  COMPREHENSIVE METABOLIC PANEL - Abnormal; Notable for the following components:      Result Value   Potassium 7.4 (*)    CO2 12 (*)    BUN 58 (*)    Creatinine, Ser 2.83 (*)    Albumin 3.4 (*)    GFR, Estimated 25 (*)    Anion gap 19 (*)    All other components within normal limits  AMMONIA - Abnormal; Notable for the following components:   Ammonia 200 (*)    All other components within normal limits  LACTIC ACID, PLASMA - Abnormal; Notable for the following components:   Lactic Acid, Venous 7.3 (*)    All other components within normal limits  I-STAT CHEM 8, ED - Abnormal; Notable for the following components:   Potassium 7.6 (*)    BUN 72 (*)    Creatinine, Ser 3.00 (*)    Calcium, Ion 1.06 (*)    TCO2 15 (*)    Hemoglobin 11.9 (*)    HCT 35.0 (*)    All other components within normal limits  CULTURE, BLOOD (ROUTINE X 2)  CULTURE, BLOOD (ROUTINE X 2)  RESP PANEL BY RT-PCR (RSV, FLU A&B, COVID)  RVPGX2  APTT  PROTIME-INR  CBC WITH DIFFERENTIAL/PLATELET  URINALYSIS, W/ REFLEX TO CULTURE (INFECTION SUSPECTED)  POTASSIUM  CBC WITH DIFFERENTIAL/PLATELET  CBG MONITORING, ED  TYPE AND SCREEN  ABO/RH    EKG EKG Interpretation  Date/Time:  Sunday August 15 2022 06:34:03 EDT Ventricular Rate:  85 PR Interval:  170 QRS Duration: 97 QT Interval:  358 QTC Calculation: 426 R Axis:   41 Text Interpretation: Sinus rhythm Left ventricular  hypertrophy Inferior infarct, old Confirmed by Quintella Reichert [phone]) on 08/15/2022 6:36:29 AM  Radiology DG Chest Port 1 View  Addendum Date: 08/15/2022   ADDENDUM REPORT: 08/15/2022 07:22 ADDENDUM: Study discussed by telephone with Dr. Quintella Reichert on 08/15/2022 at 0707 hours. Follow-up abdomen radiographs pending. Electronically Signed   By: Genevie Ann M.D.   On: 08/15/2022  07:22   Result Date: 08/15/2022 CLINICAL DATA:  62 year old male with altered mental status. GI bleed. EXAM: PORTABLE CHEST 1 VIEW COMPARISON:  Chest radiographs 07/08/2022 and earlier. FINDINGS: Portable AP semi upright view at 0638 hours. At the medial left cardiophrenic angle there is trace gas lucency which is nonspecific, but raises the possibility of trace pneumoperitoneum. Visible bowel gas pattern with mildly dilated 4 cm epigastric small bowel loop. Visible retained stool also at the colonic flexures. Mildly low lung volumes. Normal cardiac size and mediastinal contours. Visualized tracheal air column is within normal limits. Allowing for portable technique the lungs are clear. No pneumothorax or pleural effusion. No acute osseous abnormality identified. IMPRESSION: 1. Questionable trace pneumoperitoneum near the left cardiophrenic angle. Mildly dilated gas-filled small bowel loops in the visible upper abdomen. 2. Low lung volumes with no acute cardiopulmonary abnormality. Electronically Signed: By: Genevie Ann M.D. On: 08/15/2022 07:06    Procedures .Central Line  Date/Time: 08/15/2022 9:22 AM  Performed by: Margarita Mail, PA-C Authorized by: Margarita Mail, PA-C   Consent:    Consent obtained:  Emergent situation Universal protocol:    Patient identity confirmed:  Arm band Pre-procedure details:    Indication(s): central venous access     Hand hygiene: Hand hygiene performed prior to insertion     Sterile barrier technique: All elements of maximal sterile technique followed     Skin preparation:  Chlorhexidine    Skin preparation agent: Skin preparation agent completely dried prior to procedure   Anesthesia:    Anesthesia method:  Local infiltration   Local anesthetic:  Lidocaine 1% w/o epi Procedure details:    Location:  R femoral   Patient position:  Supine   Procedural supplies:  Triple lumen   Catheter size:  7 Fr   Ultrasound guidance: yes     Ultrasound guidance timing: real time     Sterile ultrasound techniques: Sterile gel and sterile probe covers were used     Number of attempts:  1   Successful placement: yes   Post-procedure details:    Post-procedure:  Dressing applied and line sutured   Assessment:  Blood return through all ports and free fluid flow   Procedure completion:  Tolerated well, no immediate complications .Critical Care  Performed by: Margarita Mail, PA-C Authorized by: Margarita Mail, PA-C   Critical care provider statement:    Critical care time (minutes):  114   Critical care start time:  08/15/2022 6:37 AM   Critical care end time:  08/15/2022 8:54 AM   Critical care time was exclusive of:  Separately billable procedures and treating other patients   Critical care was necessary to treat or prevent imminent or life-threatening deterioration of the following conditions:  Sepsis, renal failure, CNS failure or compromise and dehydration   Critical care was time spent personally by me on the following activities:  Discussions with consultants, vascular access procedures, review of old charts, re-evaluation of patient's condition, ordering and review of radiographic studies, ordering and review of laboratory studies, ordering and performing treatments and interventions, obtaining history from patient or surrogate, interpretation of cardiac output measurements, examination of patient, evaluation of patient's response to treatment and blood draw for specimens   {Document cardiac monitor, telemetry assessment procedure when appropriate:1}  Medications Ordered in  ED Medications  lactated ringers infusion ( Intravenous New Bag/Given 08/15/22 0829)  metroNIDAZOLE (FLAGYL) IVPB 500 mg (500 mg Intravenous New Bag/Given 08/15/22 0800)  vancomycin (VANCOCIN) IVPB 1000 mg/200 mL premix (1,000 mg  Intravenous New Bag/Given 08/15/22 0839)  sodium chloride 0.9 % bolus 1,000 mL (1,000 mLs Intravenous New Bag/Given 08/15/22 0843)  pantoprazole (PROTONIX) injection 40 mg (40 mg Intravenous Given 08/15/22 0753)  lactated ringers bolus 1,000 mL (0 mLs Intravenous Stopped 08/15/22 0800)    And  lactated ringers bolus 1,000 mL (0 mLs Intravenous Stopped 08/15/22 0845)    And  lactated ringers bolus 250 mL (0 mLs Intravenous Stopped 08/15/22 0825)  ceFEPIme (MAXIPIME) 2 g in sodium chloride 0.9 % 100 mL IVPB (0 g Intravenous Stopped 08/15/22 0756)    ED Course/ Medical Decision Making/ A&P Clinical Course as of 08/15/22 1010  Sun Aug 15, 2022  0657 I visulaized EKG- NO hyperacute or Peaked T waves noted [AH]  0658 Potassium(!!): 7.6 [AH]  0658 BUN(!): 72 [AH]  0658 Creatinine(!): 3.00 [AH]  0849 Lactic Acid, Venous(!!): 7.3 [AH]  0850 Ammonia(!): 200 [AH]  0916 Case discussed with Jerene Pitch from critical care.  They are aware of the patient and will see him pending results of the CT scan.  Patient is certainly more alert and oriented x 2 at this point.  Patient knows that he is at [facility]  He is sitting with eyes open, denies any active abdominal pain but states that he was vomiting black vomitus [AH]  0927 DG Chest Port 1 View [AH]  0927 DG Abdomen 1 View [AH]  0957 Hemoglobin(!): 7.9 [AH]    Clinical Course User Index [AH] Margarita Mail, PA-C   {   Click here for ABCD2, HEART and other calculatorsREFRESH Note before signing :1}                          Medical Decision Making Amount and/or Complexity of Data Reviewed Labs: ordered. Decision-making details documented in ED Course. Radiology: ordered. ECG/medicine tests: ordered.  Risk Prescription drug  management.   ***  {Document critical care time when appropriate:1} {Document review of labs and clinical decision tools ie heart score, Chads2Vasc2 etc:1}  {Document your independent review of radiology images, and any outside records:1} {Document your discussion with family members, caretakers, and with consultants:1} {Document social determinants of health affecting pt's care:1} {Document your decision making why or why not admission, treatments were needed:1} Final Clinical Impression(s) / ED Diagnoses Final diagnoses:  None    Rx / DC Orders ED Discharge Orders     None

## 2022-08-15 NOTE — Progress Notes (Signed)
[facility]link following for sespsis protocol. 

## 2022-08-15 NOTE — ED Notes (Signed)
ED TO INPATIENT HANDOFF REPORT  ED Nurse Name and Phone #: cori [phone]  S Name/Age/Gender Ronald Roman 62 y.o. male Room/Bed: 032C/032C  Code Status   Code Status: Full Code  Home/SNF/Other Skilled nursing facility Patient oriented to: self and place Is this baseline?  unk  Triage Complete: Triage complete  Chief Complaint Coffee ground emesis [K92.0]  Triage Note Pt was BIB by [facility] from [facility] facility.  Pt had coffee ground emesis when EMS arrived - pt is altered from his normal with ABD distended and firm.  Pt normally walks and talks.  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  CBG was 127 mg/dL with EMS. Pt was LKW last night at dinner.     Allergies Allergies  Allergen Reactions   Cogentin [Benztropine] Other (See Comments)    Per MAR    Penicillins Other (See Comments)    Per MAR - unable to verify patients PCN reaction    Prolixin [Fluphenazine] Other (See Comments)    Per MAR     Level of Care/Admitting Diagnosis ED Disposition     ED Disposition  Admit   Condition  --   [place]: [place] [[id number]]  Level of Care: Progressive [102]  Admit to Progressive based on following criteria: CARDIOVASCULAR & THORACIC of moderate stability with acute coronary syndrome symptoms/low risk myocardial infarction/hypertensive urgency/arrhythmias/heart failure potentially compromising stability and stable post cardiovascular intervention patients.  May admit patient to [facility] or [facility] if equivalent level of care is available:: Yes  Covid Evaluation: Asymptomatic - no recent exposure (last 10 days) testing not required  Diagnosis: Coffee ground emesis [id number]  Admitting Physician: Chesley Mires [[id number]]  Attending Physician: Chesley Mires [id number]  Certification:: I certify this patient will need inpatient services for at least 2 midnights  Estimated Length of Stay: 7          B Medical/Surgery History Past Medical History:   Diagnosis Date   Diabetes mellitus without complication ([place])    GERD (gastroesophageal reflux disease)    Hyperlipidemia    Hypertension    Paranoid schizophrenia ([place])    Seizures ([place])    Sleep apnea    Uric acid nephrolithiasis    History reviewed. No pertinent surgical history.   A IV Location/Drains/Wounds Patient Lines/Drains/Airways Status     Active Line/Drains/Airways     Name Placement date Placement time Site Days   Peripheral IV 08/15/22 22 G 1" Anterior;Left Forearm 08/15/22  0637  Forearm  less than 1   Peripheral IV 08/15/22 20 G 2.5" Right Antecubital 08/15/22  0729  Antecubital  less than 1   CVC Triple Lumen 08/15/22 Right Femoral 08/15/22  0830  -- less than 1   External Urinary Catheter 08/15/22  1010  --  less than 1            Intake/Output Last 24 hours  Intake/Output Summary (Last 24 hours) at 08/15/2022 1127 Last data filed at 08/15/2022 [id number] Gross per 24 hour  Intake 3650 ml  Output --  Net 3650 ml    Labs/Imaging Results for orders placed or performed during the hospital encounter of 08/15/22 (from the past 48 hour(s))  Comprehensive metabolic panel     Status: Abnormal   Collection Time: 08/15/22  6:40 AM  Result Value Ref Range   Sodium 136 135 - 145 mmol/L   Potassium 7.4 (HH) 3.5 - 5.1 mmol/L    Comment: HEMOLYSIS AT THIS LEVEL MAY  AFFECT RESULT CRITICAL RESULT CALLED TO, READ BACK BY AND VERIFIED WITH Irish Elders, RN AT [phone] ON 08/15/22 BY H. HOWARD.    Chloride 105 98 - 111 mmol/L   CO2 12 (L) 22 - 32 mmol/L   Glucose, Bld 96 70 - 99 mg/dL    Comment: Glucose reference range applies only to samples taken after fasting for at least 8 hours.   BUN 58 (H) 8 - 23 mg/dL   Creatinine, Ser 2.83 (H) 0.61 - 1.24 mg/dL   Calcium 9.7 8.9 - 10.3 mg/dL   Total Protein 6.9 6.5 - 8.1 g/dL   Albumin 3.4 (L) 3.5 - 5.0 g/dL   AST 26 15 - 41 U/L    Comment: HEMOLYSIS AT THIS LEVEL MAY AFFECT RESULT   ALT 10 0 - 44 U/L    Comment: HEMOLYSIS AT  THIS LEVEL MAY AFFECT RESULT   Alkaline Phosphatase 62 38 - 126 U/L   Total Bilirubin 0.9 0.3 - 1.2 mg/dL    Comment: HEMOLYSIS AT THIS LEVEL MAY AFFECT RESULT   GFR, Estimated 25 (L) >60 mL/min    Comment: (NOTE) Calculated using the CKD-EPI Creatinine Equation (2021)    Anion gap 19 (H) 5 - 15    Comment: Performed at [facility], [place] [address], [place], [place] [address]  Type and screen [place]     Status: None   Collection Time: 08/15/22  6:40 AM  Result Value Ref Range   ABO/RH(D) A POS    Antibody Screen NEG    Sample Expiration      08/18/2022,2359 Performed at [facility], [place] [address], [place], [place] [address]   Ammonia     Status: Abnormal   Collection Time: 08/15/22  6:40 AM  Result Value Ref Range   Ammonia 200 (H) 9 - 35 umol/L    Comment: HEMOLYSIS AT THIS LEVEL MAY AFFECT RESULT Performed at [facility], [place] [address], [place], [place] [address]   ABO/Rh     Status: None   Collection Time: 08/15/22  6:45 AM  Result Value Ref Range   ABO/RH(D)      A POS Performed at [facility] [address], [place], [place] [address]   I-Stat Chem 8, ED     Status: Abnormal   Collection Time: 08/15/22  6:52 AM  Result Value Ref Range   Sodium 138 135 - 145 mmol/L   Potassium 7.6 (HH) 3.5 - 5.1 mmol/L   Chloride 109 98 - 111 mmol/L   BUN 72 (H) 8 - 23 mg/dL   Creatinine, Ser 3.00 (H) 0.61 - 1.24 mg/dL   Glucose, Bld 96 70 - 99 mg/dL    Comment: Glucose reference range applies only to samples taken after fasting for at least 8 hours.   Calcium, Ion 1.06 (L) 1.15 - 1.40 mmol/L   TCO2 15 (L) 22 - 32 mmol/L   Hemoglobin 11.9 (L) 13.0 - 17.0 g/dL   HCT 35.0 (L) 39.0 - 52.0 %   Comment NOTIFIED PHYSICIAN   Lactic acid, plasma     Status: Abnormal   Collection Time: 08/15/22  7:36 AM  Result Value Ref Range   Lactic Acid, Venous 7.3 (HH) 0.5 - 1.9 mmol/L    Comment: CRITICAL RESULT CALLED TO, READ BACK BY AND  VERIFIED WITH J,BLUE RN @0840  08/15/22 E,[place] Performed at [facility], [place] [address], [place],  [address]   APTT  Status: None   Collection Time: 08/15/22  7:36 AM  Result Value Ref Range   aPTT 25 24 - 36 seconds    Comment: Performed at [facility] [address], [place], [place] [address]  Protime-INR     Status: None   Collection Time: 08/15/22  7:36 AM  Result Value Ref Range   Prothrombin Time 14.4 11.4 - 15.2 seconds   INR 1.1 0.8 - 1.2    Comment: (NOTE) INR goal varies based on device and disease states. Performed at [facility], [place] [address] [place], [place] [address]   Potassium     Status: Abnormal   Collection Time: 08/15/22  8:04 AM  Result Value Ref Range   Potassium 5.5 (H) 3.5 - 5.1 mmol/L    Comment: Performed at [facility] [address], [place], [place] [address]  CBC with Differential/Platelet     Status: Abnormal   Collection Time: 08/15/22  8:30 AM  Result Value Ref Range   WBC 7.7 4.0 - 10.5 K/uL   RBC 2.83 (L) 4.22 - 5.81 MIL/uL   Hemoglobin 7.9 (L) 13.0 - 17.0 g/dL   HCT 26.2 (L) 39.0 - 52.0 %   MCV 92.6 80.0 - 100.0 fL   MCH 27.9 26.0 - 34.0 pg   MCHC 30.2 30.0 - 36.0 g/dL   RDW 15.5 11.5 - 15.5 %   Platelets 156 150 - 400 K/uL   nRBC 0.0 0.0 - 0.2 %   Neutrophils Relative % 84 %   Neutro Abs 6.5 1.7 - 7.7 K/uL   Lymphocytes Relative 8 %   Lymphs Abs 0.6 (L) 0.7 - 4.0 K/uL   Monocytes Relative 7 %   Monocytes Absolute 0.5 0.1 - 1.0 K/uL   Eosinophils Relative 0 %   Eosinophils Absolute 0.0 0.0 - 0.5 K/uL   Basophils Relative 0 %   Basophils Absolute 0.0 0.0 - 0.1 K/uL   Immature Granulocytes 1 %   Abs Immature Granulocytes 0.04 0.00 - 0.07 K/uL    Comment: Performed at [facility], [place] [address], [place],  [address]  I-stat chem 8, ED (not at [facility] or [facility])     Status: Abnormal   Collection Time: 08/15/22  9:38 AM  Result Value Ref Range   Sodium 142 135 - 145 mmol/L    Potassium 5.0 3.5 - 5.1 mmol/L   Chloride 110 98 - 111 mmol/L   BUN 46 (H) 8 - 23 mg/dL   Creatinine, Ser 2.40 (H) 0.61 - 1.24 mg/dL   Glucose, Bld 90 70 - 99 mg/dL    Comment: Glucose reference range applies only to samples taken after fasting for at least 8 hours.   Calcium, Ion 1.19 1.15 - 1.40 mmol/L   TCO2 16 (L) 22 - 32 mmol/L   Hemoglobin 8.5 (L) 13.0 - 17.0 g/dL   HCT 25.0 (L) 39.0 - 52.0 %   CT Head Wo Contrast  Result Date: 08/15/2022 CLINICAL DATA:  Altered mental status EXAM: CT HEAD WITHOUT CONTRAST TECHNIQUE: Contiguous axial images were obtained from the base of the skull through the vertex without intravenous contrast. RADIATION DOSE REDUCTION: This exam was performed according to the departmental dose-optimization program which includes automated exposure control, adjustment of the mA and/or kV according to patient size and/or use of iterative reconstruction technique. COMPARISON:  05/29/2022 FINDINGS: Brain: The brainstem, cerebellum, cerebral peduncles, thalami, basal ganglia, basilar cisterns, and ventricular system appear within normal  limits. Periventricular white matter and corona radiata hypodensities favor chronic ischemic microvascular white matter disease. No intracranial hemorrhage, mass lesion, or acute CVA. Vascular: There is atherosclerotic calcification of the cavernous carotid arteries bilaterally. Skull: Unremarkable Sinuses/Orbits: Unremarkable Other: Cutaneous lesions along the scalp at the vertex appear chronically stable. IMPRESSION: 1. No acute intracranial findings. 2. Periventricular white matter and corona radiata hypodensities favor chronic ischemic microvascular white matter disease. 3. Atherosclerosis. 4. Chronically stable scalp lesions along the vertex, query scarring. Electronically Signed   By: Van Clines M.D.   On: 08/15/2022 09:33   CT ABDOMEN PELVIS WO CONTRAST  Result Date: 08/15/2022 CLINICAL DATA:  Abdominal pain EXAM: CT ABDOMEN AND  PELVIS WITHOUT CONTRAST TECHNIQUE: Multidetector CT imaging of the abdomen and pelvis was performed following the standard protocol without IV contrast. RADIATION DOSE REDUCTION: This exam was performed according to the departmental dose-optimization program which includes automated exposure control, adjustment of the mA and/or kV according to patient size and/or use of iterative reconstruction technique. COMPARISON:  Radiographs 08/15/2022 and report from CT scan 06/19/2005 FINDINGS: Lower chest: Patchy and clustered airspace opacities with slight nodularity observed inferiorly in the right upper lobe on images 1 through 7 of series 6, favoring pneumonia, cannot exclude atypical pneumonia. Airway thickening is present, suggesting bronchitis or reactive airways disease. Mild atelectasis or scarring in the left lower lobe and lingula. Left anterior descending and right coronary artery atherosclerotic calcification. Low-density blood pool suggests anemia. Hepatobiliary: Punctate calcifications in the liver favoring old granulomatous disease. Contracted gallbladder. Pancreas: Unremarkable Spleen: Unremarkable Adrenals/Urinary Tract: Both adrenal glands appear normal. No hydronephrosis or hydroureter. Urinary bladder normal. 5 mm hyperdense lesion in the left mid kidney on image 55 series 7, internal density 97 Hounsfield units, compatible with a small benign Bosniak category 2 cyst. No further imaging workup of this lesion is indicated. Motion blurred 1.1 cm hypodense lesion of the left mid kidney, 0 Hounsfield units, most compatible with a benign Bosniak category 1 cyst. Similar cyst reported on ultrasound of 10/11/2020. No further imaging workup of this lesion is indicated. Stomach/Bowel: Substantial prominence of stool throughout the colon and in the rectum. Normal appendix. Generalized distention of small bowel with some loops less affected but without an obvious lead point for obstruction., individual loops up to  about 4 cm in diameter. Borderline distention of the stomach. Vascular/Lymphatic: Mild aortoiliac atherosclerotic vascular calcification. A right femoral venous line terminates in the common femoral vein. Reproductive: Unremarkable Other: No supplemental non-categorized findings. Musculoskeletal: Lower lumbar spondylosis and degenerative disc disease with degenerative endplate sclerosis at the L5-S1 level and grade 1 degenerative anterolisthesis at L4-5. Moderate axial loss of articular space in both hips. IMPRESSION: 1. Patchy and clustered airspace opacities with slight nodularity inferiorly in the right upper lobe, favoring pneumonia, cannot exclude atypical pneumonia. 2. Prominent large and small bowel diffusely, with formed stool in the prominent:, and no obvious lead point for obstruction. This raises suspicion for diffuse bowel ileus, correlate with bowel sounds and clinical presentation. 3. Low-density blood pool suggests anemia. 4. Coronary atherosclerosis.  Aortic Atherosclerosis (ICD10-I70.0). 5. Lower lumbar spondylosis and degenerative disc disease. 6. Moderate axial loss of articular space in both hips. 7. Right femoral venous line terminates in the common femoral vein. 8. Left renal lesions compatible with benign cysts warranting no further imaging workup. Electronically Signed   By: Van Clines M.D.   On: 08/15/2022 09:29   DG Abd 2 Views  Result Date: 08/15/2022 CLINICAL DATA:  Vomiting. EXAM: ABDOMEN -  2 VIEW COMPARISON:  Same day. FINDINGS: No abnormal bowel dilatation is noted. Moderate amount of stool seen throughout the colon. There is no evidence of free air. No radio-opaque calculi or other significant radiographic abnormality is seen. IMPRESSION: Moderate stool burden.  No abnormal bowel dilatation. Electronically Signed   By: Marijo Conception M.D.   On: 08/15/2022 08:04   DG Abdomen 1 View  Result Date: 08/15/2022 CLINICAL DATA:  62 year old male with GI bleed. Questionable  trace pneumoperitoneum on portable chest x-ray. EXAM: ABDOMEN - 1 VIEW COMPARISON:  Portable chest 0638 hours today. FINDINGS: This one view exam was already completed at the time that I discussed the question of trace pneumoperitoneum on portable chest x-ray with Dr. Ralene Bathe. Portable AP supine view at 0659 hours. Diffuse gas-filled bowel loops, including moderate gas distention of the stomach and mid abdominal small bowel. Gas and retained stool throughout the visible colon. It remains difficult to exclude pneumoperitoneum. But there is no differential dilatation of loops to strongly suggest mechanical obstruction. No acute osseous abnormality identified. IMPRESSION: 1. This one-view supine portable abdomen was already completed when I discussed the question of trace portable chest x-ray pneumoperitoneum with Dr. Ayesha Rumpf. As on that exam, a small volume of pneumoperitoneum cannot be excluded. 2. Diffuse gas distended stomach, small bowel, and gas plus extensive retained stool in the colon. The pattern favors ileus over a mechanical bowel obstruction. Electronically Signed   By: Genevie Ann M.D.   On: 08/15/2022 07:27   DG Chest Port 1 View  Addendum Date: 08/15/2022   ADDENDUM REPORT: 08/15/2022 07:22 ADDENDUM: Study discussed by telephone with Dr. Quintella Reichert on 08/15/2022 at 0707 hours. Follow-up abdomen radiographs pending. Electronically Signed   By: Genevie Ann M.D.   On: 08/15/2022 07:22   Result Date: 08/15/2022 CLINICAL DATA:  62 year old male with altered mental status. GI bleed. EXAM: PORTABLE CHEST 1 VIEW COMPARISON:  Chest radiographs 07/08/2022 and earlier. FINDINGS: Portable AP semi upright view at 0638 hours. At the medial left cardiophrenic angle there is trace gas lucency which is nonspecific, but raises the possibility of trace pneumoperitoneum. Visible bowel gas pattern with mildly dilated 4 cm epigastric small bowel loop. Visible retained stool also at the colonic flexures. Mildly low lung volumes.  Normal cardiac size and mediastinal contours. Visualized tracheal air column is within normal limits. Allowing for portable technique the lungs are clear. No pneumothorax or pleural effusion. No acute osseous abnormality identified. IMPRESSION: 1. Questionable trace pneumoperitoneum near the left cardiophrenic angle. Mildly dilated gas-filled small bowel loops in the visible upper abdomen. 2. Low lung volumes with no acute cardiopulmonary abnormality. Electronically Signed: By: Genevie Ann M.D. On: 08/15/2022 07:06    Pending Labs Unresulted Labs (From admission, onward)     Start     Ordered   08/16/22 0500  Comprehensive metabolic panel  Tomorrow morning,   R        08/15/22 1042   08/16/22 0500  Magnesium  Tomorrow morning,   R        08/15/22 1042   08/16/22 0500  Phosphorus  Tomorrow morning,   R        08/15/22 1042   08/16/22 0500  CBC  Tomorrow morning,   R        08/15/22 1042   08/15/22 1600  Hemoglobin and hematocrit, blood  Once,   STAT        08/15/22 1042   08/15/22 0944  Lactic acid, plasma  ONCE -  STAT,   STAT        08/15/22 0944   08/15/22 0658  Urinalysis, w/ Reflex to Culture (Infection Suspected) -Urine, Clean Catch  (Septic presentation on arrival (screening labs, nursing and treatment orders for obvious sepsis))  Once,   URGENT       Question:  Specimen Source  Answer:  Urine, Clean Catch   08/15/22 0658   08/15/22 0657  Resp panel by RT-PCR (RSV, Flu A&B, Covid) Anterior Nasal Swab  (Septic presentation on arrival (screening labs, nursing and treatment orders for obvious sepsis))  Once,   URGENT        08/15/22 0658   08/15/22 0645  Blood culture (routine x 2)  BLOOD CULTURE X 2,   R (with STAT occurrences)     Question Answer Comment  Patient immune status Normal   Release to patient Immediate      08/15/22 0644   08/15/22 0634  CBC with Differential  Once,   STAT        08/15/22 0634            Vitals/Pain Today's Vitals   08/15/22 0945 08/15/22 1000  08/15/22 1030 08/15/22 1100  BP: 103/81 109/70 104/71 101/75  Pulse:  88    Resp: 17 16 19 18   Temp:      TempSrc:      SpO2:  100%    Weight:      Height:        Isolation Precautions No active isolations  Medications Medications  lactated ringers infusion ( Intravenous New Bag/Given 08/15/22 0829)  levETIRAcetam (KEPPRA) IVPB 500 mg/100 mL premix (500 mg Intravenous New Bag/Given 08/15/22 1114)  pantoprazole (PROTONIX) injection 40 mg (has no administration in time range)  Oral care mouth rinse (has no administration in time range)  Oral care mouth rinse (has no administration in time range)  sorbitol, milk of mag, mineral oil, glycerin (SMOG) enema (has no administration in time range)  sodium chloride 0.9 % bolus 1,000 mL (0 mLs Intravenous Stopped 08/15/22 0940)  pantoprazole (PROTONIX) injection 40 mg (40 mg Intravenous Given 08/15/22 0753)  metroNIDAZOLE (FLAGYL) IVPB 500 mg (0 mg Intravenous Stopped 08/15/22 0900)  vancomycin (VANCOCIN) IVPB 1000 mg/200 mL premix (0 mg Intravenous Stopped 08/15/22 0944)  lactated ringers bolus 1,000 mL (0 mLs Intravenous Stopped 08/15/22 0800)    And  lactated ringers bolus 1,000 mL (0 mLs Intravenous Stopped 08/15/22 0845)    And  lactated ringers bolus 250 mL (0 mLs Intravenous Stopped 08/15/22 0825)  ceFEPIme (MAXIPIME) 2 g in sodium chloride 0.9 % 100 mL IVPB (0 g Intravenous Stopped 08/15/22 0756)  pantoprazole (PROTONIX) injection 40 mg (40 mg Intravenous Given 08/15/22 1012)    Mobility walks     Focused Assessments Neuro Assessment Handoff:  Swallow screen pass?  Not attempted         Neuro Assessment: Exceptions to [facility] Neuro Checks:      Has TPA been given? No If patient is a Neuro Trauma and patient is going to OR before floor call report to [place] nurse: [phone] or [phone]   R Recommendations: See Admitting Provider Note  Report given to:   Additional Notes: sent from [facility] with c/o coffee ground  emesis. Patient was responsive to pain on arrival with temp of 95.8. Patient is oriented x 2-3 at this time, unsure of baseline. Patient is ward of the state with psych history, facility did not provide information. Temp improved with bair  hugger. Repeat lactic is in the lab.

## 2022-08-15 NOTE — Consult Note (Addendum)
Consultation  Referring Provider:   [facility] Primary Care Physician:  [facility] Primary Gastroenterologist:  Althia Forts       Reason for Consultation:    Coffee-ground emesis, acute on chronic normocytic anemia, ileus, in the setting of sepsis and AKI   Impression    Coffee-ground emesis in setting of sepsis On Protonix 40 mg every 12 CT abdomen pelvis without contrast no gastritis, esophagitis or upper GI issues seen  Acute on chronic normocytic anemia Initial hemoglobin 11.4 but in setting of hemoconcentration Status post IVF currently at 8.5, normocytic 11/26/2021 iron 79, TIBC 224, saturation ratios 35, ferritin 194  Ileus per patient who is not reliable source has had history of constipation CT abdomen pelvis without contrast prominent large and small bowel diffusely formed stool and prominent no obvious lead point or for obstruction With most recent KUB showing diffuse gas distended stomach, small bowel and gas was extensive retained stool in the colon.   Question pneumoperitoneum cannot be excluded on KUB Status post smog enema  Sepsis admitted to ICU with CCM following 08/15/2022 WBC 7.7  08/15/2022 Lactic Acid 4.8  Some hypotension Blood cultures pending Possible aspiration pneumonia seen on CT abdomen pelvis Questionable pneumoperitoneum Received doses of cefepime, Flagyl, vancomycin but Dced by CCM   AKI with associated hyperkalemia BUN 46 Cr 2.40  GFR 25  Potassium 5.0  Magnesium 1.9  Potassium corrected  AMS with sepsis Ammonia 200 Negative CT head   Principal Problem:   Coffee ground emesis    LOS: 0 days     Plan   -Ileus in setting of possible chronic constipation/coffee-ground emesis with potential pneumoperitoneum seen on most recent KUB but not on CT abdomen pelvis, no definite peritoneal signs on exam but patient's difficult historian and not reliable.. -See just getting CT chest especially with possible aspiration  pneumonia, any worsening abdominal pain repeat CT abdomen pelvis. Otherwise continue conservative measures listed below. -Continue to maintain magnesium above 2 and potassium at 4-4.5.  -Minimize narcotics and anticholinergic medications -If no pneumoperitoneum can do trial of clear liquids and initiation of MiraLAX/lactulose for elevated ammonia - get daily portable KUB daily -Maintain adequate hydration with IVF maintenance -Roll patient side-to-side Q2H -Suggest NG tube placement only if patient begins to have for nausea and vomiting for decompression.    -With patient's acute presentation, possible AKI/aspiration pneumonia, no plan for endoscopic evaluation at this time for CGE unless recurrent acute GI bleeding. -Continue supportive care and will reevaluate with primary GI team on Monday for timing of endoscopic evaluation. -Patient would need consent from family or power of attorney, he is oriented to self and place but not time. -Protonix 40 mg IV BID. --Continue to monitor H&H with transfusion as needed to maintain hemoglobin greater than 7.  Further recommendations per Dr. Silverio Decamp Thank you for your kind consultation, we will continue to follow.         HPI:   Ronald Roman is a 62 y.o. male with past medical history significant for type 2 diabetes, hypertension, hyperlipidemia, sleep apnea, uric acid nephrolithiasis, GERD, seizures, paranoid schizophrenia presents from skilled nursing facility, [facility], for AMS, CGE/dark emesis, AB distention.  Via EMS patient had hypotension, in the ER patient had a rectal temperature of 95.8, HD stable at that time. Labs in the ER showed acute renal function with BUN 72, creatinine 3, potassium 7.6.  This morning potassium 5, BUN 46, creatinine 2.4 Hemoglobin 11.9  with hemoconcentration, after appropriate fluid resuscitation Hgb 8.5 KUB shows moderate stool burden no abnormal bowel dilation Ammonia 200, unremarkable CT head  without contrast Pending blood cultures Urinalysis with glucose over 500, ketones but negative leukocytes nitrates no bacteria pending urine culture Negative respiratory panel CT abdomen pelvis without contrast showed patchy and clustered airspace opacities and slight nodularity right upper lobe favoring pneumonia.  Prominent large and small bowel diffusely formed stool and prominent.  No obvious lead point for obstruction.  Suspicion for diffuse bowel ileus.  Coronary atherosclerosis,  No family at bedside during evaluation Not making eye contact, oriented to self and location but not time or president. Not cooperative.  States he is never seen a GI doctor but then when asked about EGD colonoscopy states "ask Daphne". Most of the history was gathered from patient's chart, patient very poor historian. Patient does state he had constipation before coming here. Patient does state he had dark vomiting.  Abnormal ED labs: Abnormal Labs Reviewed  COMPREHENSIVE METABOLIC PANEL - Abnormal; Notable for the following components:      Result Value   Potassium 7.4 (*)    CO2 12 (*)    BUN 58 (*)    Creatinine, Ser 2.83 (*)    Albumin 3.4 (*)    GFR, Estimated 25 (*)    Anion gap 19 (*)    All other components within normal limits  AMMONIA - Abnormal; Notable for the following components:   Ammonia 200 (*)    All other components within normal limits  LACTIC ACID, PLASMA - Abnormal; Notable for the following components:   Lactic Acid, Venous 7.3 (*)    All other components within normal limits  POTASSIUM - Abnormal; Notable for the following components:   Potassium 5.5 (*)    All other components within normal limits  CBC WITH DIFFERENTIAL/PLATELET - Abnormal; Notable for the following components:   RBC 2.83 (*)    Hemoglobin 7.9 (*)    HCT 26.2 (*)    Lymphs Abs 0.6 (*)    All other components within normal limits  I-STAT CHEM 8, ED - Abnormal; Notable for the following components:    Potassium 7.6 (*)    BUN 72 (*)    Creatinine, Ser 3.00 (*)    Calcium, Ion 1.06 (*)    TCO2 15 (*)    Hemoglobin 11.9 (*)    HCT 35.0 (*)    All other components within normal limits  I-STAT CHEM 8, ED - Abnormal; Notable for the following components:   BUN 46 (*)    Creatinine, Ser 2.40 (*)    TCO2 16 (*)    Hemoglobin 8.5 (*)    HCT 25.0 (*)    All other components within normal limits     Past Medical History:  Diagnosis Date   Diabetes mellitus without complication (HCC)    GERD (gastroesophageal reflux disease)    Hyperlipidemia    Hypertension    Paranoid schizophrenia (HCC)    Seizures (HCC)    Sleep apnea    Uric acid nephrolithiasis     Surgical History:  He  has no past surgical history on file. Family History:  His family history is not on file. Social History:   reports that he has quit smoking. His smoking use included cigarettes. He smoked an average of 1 pack per day. He has never used smokeless tobacco. He reports that he does not drink alcohol and does not use drugs.  Prior to  Admission medications   Medication Sig Start Date End Date Taking? Authorizing Provider  amantadine (SYMMETREL) 100 MG capsule Take 100 mg by mouth 2 (two) times daily.   Yes [provider]  benztropine (COGENTIN) 1 MG tablet Take 1 mg by mouth 2 (two) times daily.   Yes [provider]  cetirizine (ZYRTEC) 10 MG tablet Take 10 mg by mouth daily.   Yes [provider]  clozapine (CLOZARIL) 50 MG tablet Take 50 mg by mouth 2 (two) times daily.   Yes [provider]  dapagliflozin propanediol (FARXIGA) 10 MG TABS tablet Take 10 mg by mouth daily after breakfast.   Yes [provider]  divalproex (DEPAKOTE ER) 500 MG 24 hr tablet Take 500 mg by mouth 2 (two) times daily.   Yes [provider]  hydrOXYzine (ATARAX) 25 MG tablet Take 25 mg by mouth 2 (two) times daily as needed for anxiety.   Yes [provider]   levETIRAcetam (KEPPRA) 500 MG tablet Take 500 mg by mouth 2 (two) times daily.   Yes [provider]  lisinopril (ZESTRIL) 10 MG tablet Take 10 mg by mouth daily.   Yes [provider]  magnesium oxide (MAG-OX) 400 (240 Mg) MG tablet Take 1 tablet (400 mg total) by mouth daily. 10/14/20  Yes Pokhrel, Laxman, MD  metFORMIN (GLUCOPHAGE) 1000 MG tablet Take 1,000 mg by mouth 2 (two) times daily with a meal.   Yes [provider]  mirtazapine (REMERON SOL-TAB) 15 MG disintegrating tablet Take 15 mg by mouth at bedtime.   Yes [provider]  QUEtiapine (SEROQUEL) 200 MG tablet Take 2 tablets (400 mg total) by mouth in the morning. Patient taking differently: Take 200 mg by mouth at bedtime. 11/18/20  Yes Nwoko, Terese Door, PA  QUEtiapine (SEROQUEL) 50 MG tablet Take 1-2 tablets (50-100 mg total) by mouth 3 (three) times daily. 100 mg am, 50 mg noon and 100 mg hs Patient taking differently: Take 50 mg by mouth See admin instructions. Take 50 mg (1 tablet) by mouth every morning and at 3 pm. 11/18/20  Yes Nwoko, Isidoro Donning E, PA  traZODone (DESYREL) 100 MG tablet Take 1 tablet (100 mg total) by mouth at bedtime. 11/18/20  Yes Nwoko, Uchenna E, PA  zolpidem (AMBIEN) 5 MG tablet Take 5 mg by mouth at bedtime as needed for sleep.   Yes [provider]    Current Facility-Administered Medications  Medication Dose Route Frequency Provider Last Rate Last Admin   lactated ringers infusion   Intravenous Continuous Margarita Mail, PA-C 150 mL/hr at 08/15/22 0829 New Bag at 08/15/22 0829   levETIRAcetam (KEPPRA) IVPB 500 mg/100 mL premix  500 mg Intravenous Q12H Chesley Mires, MD 400 mL/hr at 08/15/22 1114 500 mg at 08/15/22 1114   Oral care mouth rinse  15 mL Mouth Rinse 4 times per day Chesley Mires, MD       Oral care mouth rinse  15 mL Mouth Rinse PRN Chesley Mires, MD       pantoprazole (PROTONIX) injection 40 mg  40 mg Intravenous Q12H Chesley Mires, MD       sorbitol,  milk of mag, mineral oil, glycerin (SMOG) enema  300 mL Rectal Once Chesley Mires, MD       Current Outpatient Medications  Medication Sig Dispense Refill   amantadine (SYMMETREL) 100 MG capsule Take 100 mg by mouth 2 (two) times daily.     benztropine (COGENTIN) 1 MG tablet Take 1 mg  by mouth 2 (two) times daily.     cetirizine (ZYRTEC) 10 MG tablet Take 10 mg by mouth daily.     clozapine (CLOZARIL) 50 MG tablet Take 50 mg by mouth 2 (two) times daily.     dapagliflozin propanediol (FARXIGA) 10 MG TABS tablet Take 10 mg by mouth daily after breakfast.     divalproex (DEPAKOTE ER) 500 MG 24 hr tablet Take 500 mg by mouth 2 (two) times daily.     hydrOXYzine (ATARAX) 25 MG tablet Take 25 mg by mouth 2 (two) times daily as needed for anxiety.     levETIRAcetam (KEPPRA) 500 MG tablet Take 500 mg by mouth 2 (two) times daily.     lisinopril (ZESTRIL) 10 MG tablet Take 10 mg by mouth daily.     magnesium oxide (MAG-OX) 400 (240 Mg) MG tablet Take 1 tablet (400 mg total) by mouth daily. 30 tablet 0   metFORMIN (GLUCOPHAGE) 1000 MG tablet Take 1,000 mg by mouth 2 (two) times daily with a meal.     mirtazapine (REMERON SOL-TAB) 15 MG disintegrating tablet Take 15 mg by mouth at bedtime.     QUEtiapine (SEROQUEL) 200 MG tablet Take 2 tablets (400 mg total) by mouth in the morning. (Patient taking differently: Take 200 mg by mouth at bedtime.) 60 tablet 1   QUEtiapine (SEROQUEL) 50 MG tablet Take 1-2 tablets (50-100 mg total) by mouth 3 (three) times daily. 100 mg am, 50 mg noon and 100 mg hs (Patient taking differently: Take 50 mg by mouth See admin instructions. Take 50 mg (1 tablet) by mouth every morning and at 3 pm.) 150 tablet 1   traZODone (DESYREL) 100 MG tablet Take 1 tablet (100 mg total) by mouth at bedtime. 30 tablet 1   zolpidem (AMBIEN) 5 MG tablet Take 5 mg by mouth at bedtime as needed for sleep.      Allergies as of 08/15/2022 - Review Complete 08/15/2022  Allergen Reaction Noted    Cogentin [benztropine] Other (See Comments) 11/07/2014   Penicillins Other (See Comments) 11/07/2014   Prolixin [fluphenazine] Other (See Comments) 11/07/2014    Review of Systems:    Constitutional: No weight loss, fever, chills, weakness or fatigue HEENT: Eyes: No change in vision               Ears, Nose, Throat:  No change in hearing or congestion Skin: No rash or itching Cardiovascular: No chest pain, chest pressure or palpitations   Respiratory: No SOB or cough Gastrointestinal: See HPI and otherwise negative Genitourinary: No dysuria or change in urinary frequency Neurological: No headache, dizziness or syncope Musculoskeletal: No new muscle or joint pain Hematologic: No bleeding or bruising Psychiatric: No history of depression or anxiety     Physical Exam:  Vital signs in last 24 hours: Temp:  [95.8 F (35.4 C)-97.4 F (36.3 C)] 97.4 F (36.3 C) (03/24 0923) Pulse Rate:  [85-88] 88 (03/24 1000) Resp:  [15-20] 18 (03/24 1100) BP: (97-122)/(64-81) 101/75 (03/24 1100) SpO2:  [100 %] 100 % (03/24 1000) Weight:  [70 kg] 70 kg (03/24 0631)   Last BM recorded by nurses in past 5 days No data recorded  General:   Chronically ill appearing male in no acute distress Head:  Normocephalic and atraumatic. Eyes: sclerae anicteric,conjunctive pink  Heart:  regular rate and rhythm, no murmurs or gallops Pulm: Clear anteriorly; no wheezing Abdomen:  Distended tympanic, Obese AB, Hypoactive to absent bowel sounds.  Mild to moderate diffuse tenderness  without peritoneal signs, No organomegaly appreciated. Extremities:  Without edema. Msk:  Symmetrical without gross deformities. Peripheral pulses intact.  Neurologic:  Alert and  oriented x1;  No focal deficits.  Skin:   Dry and intact without significant lesions or rashes. Psychiatric: Not cooperative, no eye contact, oriented to self otherwise not to place or time.  LAB RESULTS: Recent Labs    08/15/22 0652 08/15/22 0830  08/15/22 0938  WBC  --  7.7  --   HGB 11.9* 7.9* 8.5*  HCT 35.0* 26.2* 25.0*  PLT  --  156  --    BMET Recent Labs    08/15/22 0640 08/15/22 0652 08/15/22 0804 08/15/22 0938  NA 136 138  --  142  K 7.4* 7.6* 5.5* 5.0  CL 105 109  --  110  CO2 12*  --   --   --   GLUCOSE 96 96  --  90  BUN 58* 72*  --  46*  CREATININE 2.83* 3.00*  --  2.40*  CALCIUM 9.7  --   --   --    LFT Recent Labs    08/15/22 0640  PROT 6.9  ALBUMIN 3.4*  AST 26  ALT 10  ALKPHOS 62  BILITOT 0.9   PT/INR Recent Labs    08/15/22 0736  LABPROT 14.4  INR 1.1    STUDIES: CT Head Wo Contrast  Result Date: 08/15/2022 CLINICAL DATA:  Altered mental status EXAM: CT HEAD WITHOUT CONTRAST TECHNIQUE: Contiguous axial images were obtained from the base of the skull through the vertex without intravenous contrast. RADIATION DOSE REDUCTION: This exam was performed according to the departmental dose-optimization program which includes automated exposure control, adjustment of the mA and/or kV according to patient size and/or use of iterative reconstruction technique. COMPARISON:  05/29/2022 FINDINGS: Brain: The brainstem, cerebellum, cerebral peduncles, thalami, basal ganglia, basilar cisterns, and ventricular system appear within normal limits. Periventricular white matter and corona radiata hypodensities favor chronic ischemic microvascular white matter disease. No intracranial hemorrhage, mass lesion, or acute CVA. Vascular: There is atherosclerotic calcification of the cavernous carotid arteries bilaterally. Skull: Unremarkable Sinuses/Orbits: Unremarkable Other: Cutaneous lesions along the scalp at the vertex appear chronically stable. IMPRESSION: 1. No acute intracranial findings. 2. Periventricular white matter and corona radiata hypodensities favor chronic ischemic microvascular white matter disease. 3. Atherosclerosis. 4. Chronically stable scalp lesions along the vertex, query scarring. Electronically  Signed   By: Van Clines M.D.   On: 08/15/2022 09:33   CT ABDOMEN PELVIS WO CONTRAST  Result Date: 08/15/2022 CLINICAL DATA:  Abdominal pain EXAM: CT ABDOMEN AND PELVIS WITHOUT CONTRAST TECHNIQUE: Multidetector CT imaging of the abdomen and pelvis was performed following the standard protocol without IV contrast. RADIATION DOSE REDUCTION: This exam was performed according to the departmental dose-optimization program which includes automated exposure control, adjustment of the mA and/or kV according to patient size and/or use of iterative reconstruction technique. COMPARISON:  Radiographs 08/15/2022 and report from CT scan 06/19/2005 FINDINGS: Lower chest: Patchy and clustered airspace opacities with slight nodularity observed inferiorly in the right upper lobe on images 1 through 7 of series 6, favoring pneumonia, cannot exclude atypical pneumonia. Airway thickening is present, suggesting bronchitis or reactive airways disease. Mild atelectasis or scarring in the left lower lobe and lingula. Left anterior descending and right coronary artery atherosclerotic calcification. Low-density blood pool suggests anemia. Hepatobiliary: Punctate calcifications in the liver favoring old granulomatous disease. Contracted gallbladder. Pancreas: Unremarkable Spleen: Unremarkable Adrenals/Urinary Tract: Both adrenal glands appear normal.  No hydronephrosis or hydroureter. Urinary bladder normal. 5 mm hyperdense lesion in the left mid kidney on image 55 series 7, internal density 97 Hounsfield units, compatible with a small benign Bosniak category 2 cyst. No further imaging workup of this lesion is indicated. Motion blurred 1.1 cm hypodense lesion of the left mid kidney, 0 Hounsfield units, most compatible with a benign Bosniak category 1 cyst. Similar cyst reported on ultrasound of 10/11/2020. No further imaging workup of this lesion is indicated. Stomach/Bowel: Substantial prominence of stool throughout the colon and in  the rectum. Normal appendix. Generalized distention of small bowel with some loops less affected but without an obvious lead point for obstruction., individual loops up to about 4 cm in diameter. Borderline distention of the stomach. Vascular/Lymphatic: Mild aortoiliac atherosclerotic vascular calcification. A right femoral venous line terminates in the common femoral vein. Reproductive: Unremarkable Other: No supplemental non-categorized findings. Musculoskeletal: Lower lumbar spondylosis and degenerative disc disease with degenerative endplate sclerosis at the L5-S1 level and grade 1 degenerative anterolisthesis at L4-5. Moderate axial loss of articular space in both hips. IMPRESSION: 1. Patchy and clustered airspace opacities with slight nodularity inferiorly in the right upper lobe, favoring pneumonia, cannot exclude atypical pneumonia. 2. Prominent large and small bowel diffusely, with formed stool in the prominent:, and no obvious lead point for obstruction. This raises suspicion for diffuse bowel ileus, correlate with bowel sounds and clinical presentation. 3. Low-density blood pool suggests anemia. 4. Coronary atherosclerosis.  Aortic Atherosclerosis (ICD10-I70.0). 5. Lower lumbar spondylosis and degenerative disc disease. 6. Moderate axial loss of articular space in both hips. 7. Right femoral venous line terminates in the common femoral vein. 8. Left renal lesions compatible with benign cysts warranting no further imaging workup. Electronically Signed   By: Van Clines M.D.   On: 08/15/2022 09:29   DG Abd 2 Views  Result Date: 08/15/2022 CLINICAL DATA:  Vomiting. EXAM: ABDOMEN - 2 VIEW COMPARISON:  Same day. FINDINGS: No abnormal bowel dilatation is noted. Moderate amount of stool seen throughout the colon. There is no evidence of free air. No radio-opaque calculi or other significant radiographic abnormality is seen. IMPRESSION: Moderate stool burden.  No abnormal bowel dilatation.  Electronically Signed   By: Marijo Conception M.D.   On: 08/15/2022 08:04   DG Abdomen 1 View  Result Date: 08/15/2022 CLINICAL DATA:  62 year old male with GI bleed. Questionable trace pneumoperitoneum on portable chest x-ray. EXAM: ABDOMEN - 1 VIEW COMPARISON:  Portable chest 0638 hours today. FINDINGS: This one view exam was already completed at the time that I discussed the question of trace pneumoperitoneum on portable chest x-ray with Dr. Ralene Bathe. Portable AP supine view at 0659 hours. Diffuse gas-filled bowel loops, including moderate gas distention of the stomach and mid abdominal small bowel. Gas and retained stool throughout the visible colon. It remains difficult to exclude pneumoperitoneum. But there is no differential dilatation of loops to strongly suggest mechanical obstruction. No acute osseous abnormality identified. IMPRESSION: 1. This one-view supine portable abdomen was already completed when I discussed the question of trace portable chest x-ray pneumoperitoneum with Dr. Ayesha Rumpf. As on that exam, a small volume of pneumoperitoneum cannot be excluded. 2. Diffuse gas distended stomach, small bowel, and gas plus extensive retained stool in the colon. The pattern favors ileus over a mechanical bowel obstruction. Electronically Signed   By: Genevie Ann M.D.   On: 08/15/2022 07:27   DG Chest Port 1 View  Addendum Date: 08/15/2022   ADDENDUM REPORT: 08/15/2022  07:22 ADDENDUM: Study discussed by telephone with Dr. Quintella Reichert on 08/15/2022 at 0707 hours. Follow-up abdomen radiographs pending. Electronically Signed   By: Genevie Ann M.D.   On: 08/15/2022 07:22   Result Date: 08/15/2022 CLINICAL DATA:  62 year old male with altered mental status. GI bleed. EXAM: PORTABLE CHEST 1 VIEW COMPARISON:  Chest radiographs 07/08/2022 and earlier. FINDINGS: Portable AP semi upright view at 0638 hours. At the medial left cardiophrenic angle there is trace gas lucency which is nonspecific, but raises the possibility of  trace pneumoperitoneum. Visible bowel gas pattern with mildly dilated 4 cm epigastric small bowel loop. Visible retained stool also at the colonic flexures. Mildly low lung volumes. Normal cardiac size and mediastinal contours. Visualized tracheal air column is within normal limits. Allowing for portable technique the lungs are clear. No pneumothorax or pleural effusion. No acute osseous abnormality identified. IMPRESSION: 1. Questionable trace pneumoperitoneum near the left cardiophrenic angle. Mildly dilated gas-filled small bowel loops in the visible upper abdomen. 2. Low lung volumes with no acute cardiopulmonary abnormality. Electronically Signed: By: Genevie Ann M.D. On: 08/15/2022 07:06     Vladimir Crofts  08/15/2022, 11:28 AM   Attending physician's note  I have taken a history, reviewed the chart and examined the patient. I performed a substantive portion of this encounter, including complete performance of at least one of the key components, in conjunction with the APP. I agree with the APP's note, impression and recommendations.    62 year-old male with history of paranoid schizophrenia was brought in from SNF with coffee-ground emesis and altered mental status  He has bilateral lung infiltrates concerning for possible aspiration pneumonitis ?  Pneumoperitoneum on x-ray but was not detected on CT abdomen and pelvis, consider CT chest for further evaluation  AKI, potassium 7.4 corrected to 5.0  He has increased stool burden on CT abdomen pelvis with diffuse dilation of small and large bowel He is getting smog enema Plan for bowel purge with MiraLAX followed by daily MiraLAX to prevent constipation  Clear liquid diet today and advance slowly as tolerated Antireflux measures Monitor hemoglobin and transfuse if below 7 Pantoprazole 40 mg IV twice daily  Elevated ammonia likely secondary to medication related hyperammonemia, worsen in the setting of AKI .Marland Kitchen  Clinically he has no signs of  cirrhosis or portal hypertension  Will defer endoscopic evaluation at this point until acute issues resolve  GI will continue to follow along   The patient was provided an opportunity to ask questions and all were answered. The patient agreed with the plan and demonstrated an understanding of the instructions.  Damaris Hippo , MD [phone]

## 2022-08-15 NOTE — ED Triage Notes (Addendum)
Pt was BIB by [facility] from [facility] facility.  Pt had coffee ground emesis when EMS arrived - pt is altered from his normal with ABD distended and firm.  Pt normally walks and talks.  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  CBG was 127 mg/dL with EMS. Pt was LKW last night at dinner.

## 2022-08-15 NOTE — ED Notes (Signed)
Potassium of 7.4 called from lab-Scheving MD and Early Osmond

## 2022-08-16 ENCOUNTER — Inpatient Hospital Stay (HOSPITAL_COMMUNITY): Payer: Medicaid Other

## 2022-08-16 ENCOUNTER — Ambulatory Visit (HOSPITAL_COMMUNITY): Payer: Medicaid Other

## 2022-08-16 DIAGNOSIS — K5900 Constipation, unspecified: Secondary | ICD-10-CM

## 2022-08-16 DIAGNOSIS — K920 Hematemesis: Secondary | ICD-10-CM | POA: Diagnosis not present

## 2022-08-16 DIAGNOSIS — R933 Abnormal findings on diagnostic imaging of other parts of digestive tract: Secondary | ICD-10-CM | POA: Diagnosis not present

## 2022-08-16 LAB — BLOOD CULTURE ID PANEL (REFLEXED) - BCID2

## 2022-08-16 LAB — COMPREHENSIVE METABOLIC PANEL
ALT: 10 U/L (ref 0–44)
AST: 18 U/L (ref 15–41)
Albumin: 3.1 g/dL — ABNORMAL LOW (ref 3.5–5.0)
Alkaline Phosphatase: 57 U/L (ref 38–126)
Anion gap: 9 (ref 5–15)
BUN: 42 mg/dL — ABNORMAL HIGH (ref 8–23)
CO2: 22 mmol/L (ref 22–32)
Calcium: 9.3 mg/dL (ref 8.9–10.3)
Chloride: 107 mmol/L (ref 98–111)
Creatinine, Ser: 1.6 mg/dL — ABNORMAL HIGH (ref 0.61–1.24)
GFR, Estimated: 49 mL/min — ABNORMAL LOW (ref >60)
Glucose, Bld: 128 mg/dL — ABNORMAL HIGH (ref 70–99)
Potassium: 4.3 mmol/L (ref 3.5–5.1)
Sodium: 138 mmol/L (ref 135–145)
Total Bilirubin: 0.4 mg/dL (ref 0.3–1.2)
Total Protein: 6.2 g/dL — ABNORMAL LOW (ref 6.5–8.1)

## 2022-08-16 LAB — CBC
HCT: 26.4 % — ABNORMAL LOW (ref 39.0–52.0)
HCT: 27.9 % — ABNORMAL LOW (ref 39.0–52.0)
Hemoglobin: 8.7 g/dL — ABNORMAL LOW (ref 13.0–17.0)
Hemoglobin: 9.1 g/dL — ABNORMAL LOW (ref 13.0–17.0)
MCH: 28.2 pg (ref 26.0–34.0)
MCH: 28.2 pg (ref 26.0–34.0)
MCHC: 33 g/dL (ref 30.0–36.0)
MCHC: 32.6 g/dL (ref 30.0–36.0)
MCV: 85.7 fL (ref 80.0–100.0)
MCV: 86.4 fL (ref 80.0–100.0)
Platelets: 174 10*3/uL (ref 150–400)
Platelets: 142 10*3/uL — ABNORMAL LOW (ref 150–400)
RBC: 3.08 MIL/uL — ABNORMAL LOW (ref 4.22–5.81)
RBC: 3.23 MIL/uL — ABNORMAL LOW (ref 4.22–5.81)
RDW: 14.8 % (ref 11.5–15.5)
RDW: 15 % (ref 11.5–15.5)
WBC: 8.1 10*3/uL (ref 4.0–10.5)
WBC: 8.5 10*3/uL (ref 4.0–10.5)
nRBC: 0 % (ref 0.0–0.2)
nRBC: 0 % (ref 0.0–0.2)

## 2022-08-16 LAB — PHOSPHORUS: Phosphorus: 4.1 mg/dL (ref 2.5–4.6)

## 2022-08-16 LAB — GLUCOSE, CAPILLARY: Glucose-Capillary: 86 mg/dL (ref 70–99)

## 2022-08-16 LAB — LACTIC ACID, PLASMA
Lactic Acid, Venous: 4.4 mmol/L (ref 0.5–1.9)
Lactic Acid, Venous: 1.7 mmol/L (ref 0.5–1.9)

## 2022-08-16 LAB — MAGNESIUM: Magnesium: 1.7 mg/dL (ref 1.7–2.4)

## 2022-08-16 LAB — OCCULT BLOOD, POC DEVICE: Fecal Occult Bld: NEGATIVE

## 2022-08-16 LAB — AMMONIA: Ammonia: 12 umol/L (ref 9–35)

## 2022-08-16 MED ORDER — SODIUM CHLORIDE 0.9% FLUSH: 10.0000 mL | INTRAVENOUS | Status: DC | PRN

## 2022-08-16 MED ORDER — CHLORHEXIDINE GLUCONATE CLOTH 2 % EX PADS
6.0000 | MEDICATED_PAD | Freq: Two times a day (BID) | CUTANEOUS | Status: DC
  Administered 2022-08-17 – 2022-08-19 (×5): 6 via TOPICAL

## 2022-08-16 MED ORDER — SORBITOL 70 % SOLN
400.0000 mL | TOPICAL_OIL | Freq: Once | ORAL | Status: AC
  Administered 2022-08-16: 400 mL via RECTAL
  Filled 2022-08-16: qty 120

## 2022-08-16 MED ORDER — HALOPERIDOL LACTATE 5 MG/ML IJ SOLN: 5.0000 mg | Freq: Once | INTRAMUSCULAR | Status: DC | PRN

## 2022-08-16 MED ORDER — SODIUM CHLORIDE 0.9% FLUSH
10.0000 mL | Freq: Two times a day (BID) | INTRAVENOUS | Status: DC
  Administered 2022-08-16 (×3): 10 mL

## 2022-08-16 MED ORDER — LACTATED RINGERS IV SOLN: INTRAVENOUS | Status: DC

## 2022-08-16 MED ORDER — ALTEPLASE 2 MG IJ SOLR
2.0000 mg | Freq: Once | INTRAMUSCULAR | Status: AC
  Administered 2022-08-16: 2 mg
  Filled 2022-08-16: qty 2

## 2022-08-16 NOTE — TOC Initial Note (Signed)
Transition of Care [facility]) - Initial/Assessment Note    Patient Details  Name: Ronald Roman MRN: [id number] Date of Birth: 1960/10/04  Transition of Care [facility]) CM/SW Contact:    Geralynn Ochs, LCSW Phone Number: 08/16/2022, 1:41 PM  Clinical Narrative:     Patient from [place], and is a ward of the state. Medical workup ongoing, CSW to follow.              Expected Discharge Plan: Assisted Living Barriers to Discharge: Continued Medical Work up   Patient Goals and CMS Choice Patient states their goals for this hospitalization and ongoing recovery are:: patient unable to participate in goal setting, only oriented to self CMS Medicare.gov Compare Post Acute Care list provided to:: Legal Guardian Choice offered to / list presented to : [place] ownership interest in [facility].provided to:: [facility] POA / Guardian    Expected Discharge Plan and Services     Post Acute Care Choice: NA Living arrangements for the past 2 months: [place]                                      Prior Living Arrangements/Services Living arrangements for the past 2 months: [place] Lives with:: Facility Resident Patient language and need for interpreter reviewed:: No Do you feel safe going back to the place where you live?: Yes      Need for Family Participation in Patient Care: Yes (Comment) Care giver support system in place?: Yes (comment)   Criminal Activity/Legal Involvement Pertinent to Current Situation/Hospitalization: No - Comment as needed  Activities of Daily Living      Permission Sought/Granted Permission sought to share information with : Facility Sport and exercise psychologist, Family Supports Permission granted to share information with : Yes, Verbal Permission Granted  Share Information with NAME: [place] granted to share info w AGENCY: [place] granted to share  info w Relationship: Guardian     Emotional Assessment   Attitude/Demeanor/Rapport: Unable to Assess Affect (typically observed): Unable to Assess Orientation: : Oriented to Self Alcohol / Substance Use: Not Applicable Psych Involvement: No (comment)  Admission diagnosis:  Coffee ground emesis [K92.0] Patient Active Problem List   Diagnosis Date Noted   Coffee ground emesis 08/15/2022   Insomnia due to other mental disorder 11/18/2020   Episode of recurrent major depressive disorder ([place]) [id number]   Acute metabolic encephalopathy [id number]   Impaired cognitive ability    Aggressive behavior    Seizure ([place]) 05/07/2020   AMS (altered mental status) 02/09/2020   Schizophrenia ([place])    Leukocytosis 02/06/2015   Sleep apnea 02/05/2015   AKI (acute kidney injury) ([place]) 02/04/2015   Seizure disorder ([place]) 02/04/2015   Acute encephalopathy 02/04/2015   Diabetes mellitus without complication ([place])    Hyperlipidemia    Hypertension    Paranoid schizophrenia [facility])    PCP:  [facility] [place]:   [address], [address] [place] [address] Phone: [phone] Fax: [phone]     Social Determinants of Health (SDOH) Social History: SDOH Screenings   Depression (PHQ2-9): Medium Risk (04/29/2021)  Tobacco Use: Medium Risk (08/15/2022)   SDOH Interventions:     Readmission Risk Interventions     No data to display

## 2022-08-16 NOTE — Progress Notes (Signed)
Progress Note   Subjective  Per nursing no vomiting or bleeding symptoms today. Patient denies any pain. Passed brown stool today.    Objective   Vital signs in last 24 hours: Temp:  [97.4 F (36.3 C)-98.7 F (37.1 C)] 98.4 F (36.9 C) (03/25 1626) Pulse Rate:  [94-100] 100 (03/25 1626) Resp:  [18-21] 18 (03/25 1626) BP: (119-134)/(71-98) 124/88 (03/25 1626) SpO2:  [97 %-100 %] 100 % (03/25 1100) Last BM Date : 08/16/22 General:    AA male in NAD Neurologic:  Alert and oriented Psych:  Cooperative. Normal mood and affect.  Intake/Output from previous day: 03/24 0701 - 03/25 0700 In: 6192.9 [P.O.:540; I.V.:1802.9; IV Piggyback:3850] Out: 1100 [Urine:1100] Intake/Output this shift: Total I/O In: 829.4 [I.V.:729.4; IV Piggyback:100] Out: -   Lab Results: Recent Labs    08/15/22 0830 08/15/22 0938 08/15/22 1541 08/16/22 0600  WBC 7.7  --   --  8.1  HGB 7.9* 8.5* 9.8* 8.7*  HCT 26.2* 25.0* 30.0* 26.4*  PLT 156  --   --  174   BMET Recent Labs    08/15/22 0640 08/15/22 0652 08/15/22 0804 08/15/22 0938 08/16/22 0600  NA 136 138  138  --  142 138  K 7.4* 7.6*  7.6* 5.5* 5.0 4.3  CL 105 109  109  --  110 107  CO2 12*  --   --   --  22  GLUCOSE 96 96  96  --  90 128*  BUN 58* 72*  72*  --  46* 42*  CREATININE 2.83* 3.00*  3.00*  --  2.40* 1.60*  CALCIUM 9.7  --   --   --  9.3   LFT Recent Labs    08/16/22 0600  PROT 6.2*  ALBUMIN 3.1*  AST 18  ALT 10  ALKPHOS 57  BILITOT 0.4   PT/INR Recent Labs    08/15/22 0736  LABPROT 14.4  INR 1.1    Studies/Results: DG CHEST PORT 1 VIEW  Result Date: 08/16/2022 CLINICAL DATA:  SOB (shortness of breath). Abdominal distension. Ileus ([place]). EXAM: PORTABLE CHEST 1 VIEW COMPARISON:  Chest radiograph March 24, 24. FINDINGS: Mild streaky bibasilar opacities. No confluent consolidation. No visible pleural effusions or pneumothorax. Cardiomediastinal silhouette is unchanged. IMPRESSION: Mild streaky  bibasilar opacities, which could represent atelectasis, aspiration and/or pneumonia. Electronically Signed   By: Margaretha Sheffield M.D.   On: 08/16/2022 14:48   DG Abd Portable 1V  Result Date: 08/16/2022 CLINICAL DATA:  Abdominal distention EXAM: PORTABLE ABDOMEN - 1 VIEW COMPARISON:  Previous studies including the examination done earlier today FINDINGS: There is moderate to marked gaseous distention of stomach with interval worsening. There are gas-filled small bowel loops. Small bowel loops measure up to 4.8 cm in diameter. Gas and stool are present in colon. IMPRESSION: There is gastric distention. There is dilation of small bowel loops filled with gas. Gas and stool are present in the colon. These findings appear more prominent, possibly suggesting ileus or partial obstruction. Electronically Signed   By: Elmer Picker M.D.   On: 08/16/2022 14:47   DG Abd Portable 1V  Result Date: 08/16/2022 CLINICAL DATA:  [id number] with ileus. EXAM: PORTABLE ABDOMEN - 1 VIEW COMPARISON:  Similar exam yesterday at 7:44 a.m. FINDINGS: Moderate retained fecal burden is again noted. There is increased small bowel dilatation in the mid to lower abdomen up to 4 cm, previously 3.4 cm. There is no supine evidence of free air. No radiopaque  urinary stone is evident. There is a right femoral central line terminating in the right common iliac vein. IMPRESSION: 1. Increased small bowel dilatation in the mid to lower abdomen up to 4 cm, previously 3.4 cm. Moderate retained fecal burden. 2. Right femoral central line. Electronically Signed   By: Telford Nab M.D.   On: 08/16/2022 05:43   CT Head Wo Contrast  Result Date: 08/15/2022 CLINICAL DATA:  Altered mental status EXAM: CT HEAD WITHOUT CONTRAST TECHNIQUE: Contiguous axial images were obtained from the base of the skull through the vertex without intravenous contrast. RADIATION DOSE REDUCTION: This exam was performed according to the departmental dose-optimization  program which includes automated exposure control, adjustment of the mA and/or kV according to patient size and/or use of iterative reconstruction technique. COMPARISON:  05/29/2022 FINDINGS: Brain: The brainstem, cerebellum, cerebral peduncles, thalami, basal ganglia, basilar cisterns, and ventricular system appear within normal limits. Periventricular white matter and corona radiata hypodensities favor chronic ischemic microvascular white matter disease. No intracranial hemorrhage, mass lesion, or acute CVA. Vascular: There is atherosclerotic calcification of the cavernous carotid arteries bilaterally. Skull: Unremarkable Sinuses/Orbits: Unremarkable Other: Cutaneous lesions along the scalp at the vertex appear chronically stable. IMPRESSION: 1. No acute intracranial findings. 2. Periventricular white matter and corona radiata hypodensities favor chronic ischemic microvascular white matter disease. 3. Atherosclerosis. 4. Chronically stable scalp lesions along the vertex, query scarring. Electronically Signed   By: Van Clines M.D.   On: 08/15/2022 09:33   CT ABDOMEN PELVIS WO CONTRAST  Result Date: 08/15/2022 CLINICAL DATA:  Abdominal pain EXAM: CT ABDOMEN AND PELVIS WITHOUT CONTRAST TECHNIQUE: Multidetector CT imaging of the abdomen and pelvis was performed following the standard protocol without IV contrast. RADIATION DOSE REDUCTION: This exam was performed according to the departmental dose-optimization program which includes automated exposure control, adjustment of the mA and/or kV according to patient size and/or use of iterative reconstruction technique. COMPARISON:  Radiographs 08/15/2022 and report from CT scan 06/19/2005 FINDINGS: Lower chest: Patchy and clustered airspace opacities with slight nodularity observed inferiorly in the right upper lobe on images 1 through 7 of series 6, favoring pneumonia, cannot exclude atypical pneumonia. Airway thickening is present, suggesting bronchitis or  reactive airways disease. Mild atelectasis or scarring in the left lower lobe and lingula. Left anterior descending and right coronary artery atherosclerotic calcification. Low-density blood pool suggests anemia. Hepatobiliary: Punctate calcifications in the liver favoring old granulomatous disease. Contracted gallbladder. Pancreas: Unremarkable Spleen: Unremarkable Adrenals/Urinary Tract: Both adrenal glands appear normal. No hydronephrosis or hydroureter. Urinary bladder normal. 5 mm hyperdense lesion in the left mid kidney on image 55 series 7, internal density 97 Hounsfield units, compatible with a small benign Bosniak category 2 cyst. No further imaging workup of this lesion is indicated. Motion blurred 1.1 cm hypodense lesion of the left mid kidney, 0 Hounsfield units, most compatible with a benign Bosniak category 1 cyst. Similar cyst reported on ultrasound of 10/11/2020. No further imaging workup of this lesion is indicated. Stomach/Bowel: Substantial prominence of stool throughout the colon and in the rectum. Normal appendix. Generalized distention of small bowel with some loops less affected but without an obvious lead point for obstruction., individual loops up to about 4 cm in diameter. Borderline distention of the stomach. Vascular/Lymphatic: Mild aortoiliac atherosclerotic vascular calcification. A right femoral venous line terminates in the common femoral vein. Reproductive: Unremarkable Other: No supplemental non-categorized findings. Musculoskeletal: Lower lumbar spondylosis and degenerative disc disease with degenerative endplate sclerosis at the L5-S1 level and grade 1  degenerative anterolisthesis at L4-5. Moderate axial loss of articular space in both hips. IMPRESSION: 1. Patchy and clustered airspace opacities with slight nodularity inferiorly in the right upper lobe, favoring pneumonia, cannot exclude atypical pneumonia. 2. Prominent large and small bowel diffusely, with formed stool in the  prominent:, and no obvious lead point for obstruction. This raises suspicion for diffuse bowel ileus, correlate with bowel sounds and clinical presentation. 3. Low-density blood pool suggests anemia. 4. Coronary atherosclerosis.  Aortic Atherosclerosis (ICD10-I70.0). 5. Lower lumbar spondylosis and degenerative disc disease. 6. Moderate axial loss of articular space in both hips. 7. Right femoral venous line terminates in the common femoral vein. 8. Left renal lesions compatible with benign cysts warranting no further imaging workup. Electronically Signed   By: Van Clines M.D.   On: 08/15/2022 09:29   DG Abd 2 Views  Result Date: 08/15/2022 CLINICAL DATA:  Vomiting. EXAM: ABDOMEN - 2 VIEW COMPARISON:  Same day. FINDINGS: No abnormal bowel dilatation is noted. Moderate amount of stool seen throughout the colon. There is no evidence of free air. No radio-opaque calculi or other significant radiographic abnormality is seen. IMPRESSION: Moderate stool burden.  No abnormal bowel dilatation. Electronically Signed   By: Marijo Conception M.D.   On: 08/15/2022 08:04   DG Abdomen 1 View  Result Date: 08/15/2022 CLINICAL DATA:  62 year old male with GI bleed. Questionable trace pneumoperitoneum on portable chest x-ray. EXAM: ABDOMEN - 1 VIEW COMPARISON:  Portable chest 0638 hours today. FINDINGS: This one view exam was already completed at the time that I discussed the question of trace pneumoperitoneum on portable chest x-ray with Dr. Ralene Bathe. Portable AP supine view at 0659 hours. Diffuse gas-filled bowel loops, including moderate gas distention of the stomach and mid abdominal small bowel. Gas and retained stool throughout the visible colon. It remains difficult to exclude pneumoperitoneum. But there is no differential dilatation of loops to strongly suggest mechanical obstruction. No acute osseous abnormality identified. IMPRESSION: 1. This one-view supine portable abdomen was already completed when I discussed  the question of trace portable chest x-ray pneumoperitoneum with Dr. Ayesha Rumpf. As on that exam, a small volume of pneumoperitoneum cannot be excluded. 2. Diffuse gas distended stomach, small bowel, and gas plus extensive retained stool in the colon. The pattern favors ileus over a mechanical bowel obstruction. Electronically Signed   By: Genevie Ann M.D.   On: 08/15/2022 07:27   DG Chest Port 1 View  Addendum Date: 08/15/2022   ADDENDUM REPORT: 08/15/2022 07:22 ADDENDUM: Study discussed by telephone with Dr. Quintella Reichert on 08/15/2022 at 0707 hours. Follow-up abdomen radiographs pending. Electronically Signed   By: Genevie Ann M.D.   On: 08/15/2022 07:22   Result Date: 08/15/2022 CLINICAL DATA:  62 year old male with altered mental status. GI bleed. EXAM: PORTABLE CHEST 1 VIEW COMPARISON:  Chest radiographs 07/08/2022 and earlier. FINDINGS: Portable AP semi upright view at 0638 hours. At the medial left cardiophrenic angle there is trace gas lucency which is nonspecific, but raises the possibility of trace pneumoperitoneum. Visible bowel gas pattern with mildly dilated 4 cm epigastric small bowel loop. Visible retained stool also at the colonic flexures. Mildly low lung volumes. Normal cardiac size and mediastinal contours. Visualized tracheal air column is within normal limits. Allowing for portable technique the lungs are clear. No pneumothorax or pleural effusion. No acute osseous abnormality identified. IMPRESSION: 1. Questionable trace pneumoperitoneum near the left cardiophrenic angle. Mildly dilated gas-filled small bowel loops in the visible upper abdomen. 2. Low lung  volumes with no acute cardiopulmonary abnormality. Electronically Signed: By: Genevie Ann M.D. On: 08/15/2022 07:06       Assessment / Plan:    62 y/o male here with the following  Coffee ground emesis Anemia Ileus  Admitted with coffee ground emesis and altered mental status with some abdominal distension. Possible aspiration pneumonia.  AKI with hyperkalemia yesterday, this is improved today. Xray shows dilated stomach and small bowel, stool in colon. He has had multiple brown stools today however, he is not obstructed and states he is feeling better.  Would continue enemas for now to help treat constipation, Miralax when he can tolerate it. He is not vomiting, but if he feels worse and this occurs would need NG tube. At some point can do EGD, will reassess him in the AM, possibly in next 1-2 days but would like his ileus / constipation improved prior to that.  Call with questions, continue PPI and supportive care for now.  Jolly Mango, MD [facility]

## 2022-08-16 NOTE — Progress Notes (Signed)
[facility] Progress Note Patient: Ronald Roman [id number] DOB: [birth year] DOA: 08/15/2022  DOS: the patient was seen and examined on 08/16/2022  Brief hospital course: PMH of HTN, type II DM, GERD, schizophrenia, HLD, OSA, seizure present to the hospital with complaints of coffee-ground emesis as well as abdominal distention. Found to have partial SBO versus ileus as well as possible aspiration pneumonitis. Initially admitted to the ICU.  GI was consulted. Assessment and Plan: Hypotension from hypovolemia. Treated with IV fluid with improvement. Will continue IV fluid for now.  Ileus. Partial bowel obstruction. GI consulted appreciate assistance. Patient had coffee colored emesis prior to admission. Currently on Protonix twice daily. EGD scheduled soon. Appreciate GI assistance.  Severe constipation. Treated with enema. Will monitor.  AKI. Currently being treated with IV fluids. Will monitor.  Aspiration pneumonitis. Currently no evidence of bacterial infection. Will monitor closely. Low threshold to initiate antibiotics.  Poor IV access. Patient had femoral central line placed yesterday at the time of admission. Currently requesting IV team to place a PIV so that we can remove the central line. Will monitor.  Type 2 diabetes mellitus, uncontrolled with hyperglycemia without long-term insulin use. Currently Scale insulin.  HTN. Blood pressure was soft therefore currently blood pressure medications on hold. Monitor.  History of schizophrenia. History of seizures. On IV Keppra for now. Holding home regimen for now.  Hyperammonemia. Ammonia level was 200 at the time of admission. Most likely lab error as the recheck is 12.  Lactic acidosis. Still remains elevated. Lactic acid 4.4. Continue with IV hydration.   Subjective: No nausea no vomiting no fever no chills.  Physical Exam: General: in Mild distress, No Rash Cardiovascular: S1 and S2  Present, No Murmur Respiratory: Good respiratory effort, Bilateral Air entry present. No Crackles, No wheezes Abdomen: Bowel Sound sluggish, distended, diffuse tenderness. Extremities: No edema Neuro: Alert and oriented x self only, speech dysarthric, no new focal deficit  Data Reviewed: I have Reviewed nursing notes, Vitals, and Lab results. Since last encounter, pertinent lab results CBC and BMP   . I have ordered test including CT C and BMP  . I have discussed pt's care plan and test results with GI  . I have ordered imaging chest x-ray and x-ray abdomen  .    Disposition: Status is: Inpatient Remains inpatient appropriate because: Required IV fluid will require further IV therapy.  Place and maintain sequential compression device Start: 08/15/22 1043   Family Communication: No one at bedside Level of care: Progressive   Vitals:   08/16/22 0446 08/16/22 0728 08/16/22 1100 08/16/22 1626  BP: 134/85 129/83 119/71 124/88  Pulse: 94  98 100  Resp: (!) 21  19 18   Temp: 98.7 F (37.1 C) 98.4 F (36.9 C) 98 F (36.7 C) 98.4 F (36.9 C)  TempSrc: Oral Oral Oral Oral  SpO2: 100%  100%   Weight:      Height:         Author: Berle Mull, MD 08/16/2022 7:59 PM  Please look on [email or web address] to find out who is on call.

## 2022-08-16 NOTE — Progress Notes (Signed)
[facility] night cross cover note:   I was notified by RN that the patient is restless/agitated, and pulled out his femorally-inserted central line. Some initial blood loss from this sequence. Pressure being applied. Most recent VS reflect systolic BP in the [id number], HR in the low 100's. Will recheck CBC at this time. No report of concomitant vomiting at this time. Haldol 5 mg IV once prn for agitation ordered.    Babs Bertin, DO Hospitalist

## 2022-08-16 NOTE — Progress Notes (Signed)
RN went to pt room, noted to be restless . Bleeding was also noted and femoral central catheter was pulled out. Pressure applied on the site. After few minutes, bleeding stops. Vital signs as follows BP 146/86, HR 101, RR 22 and 100% on RA. On call provider notified.

## 2022-08-16 NOTE — Progress Notes (Signed)
PHARMACY - PHYSICIAN COMMUNICATION CRITICAL VALUE ALERT - BLOOD CULTURE IDENTIFICATION (BCID)  Ronald Roman is an 62 y.o. male who presented to [facility] on 08/15/2022 with a chief complaint of coffee ground emesis   Name of physician (or Provider) Contacted: Dr. Velia Meyer   Current antibiotics: None  Changes to prescribed antibiotics recommended:  Cont to monitor off anti-biotics   Results for orders placed or performed during the hospital encounter of 08/15/22  Blood Culture ID Panel (Reflexed) (Collected: 08/15/2022  7:37 AM)  Result Value Ref Range   Enterococcus faecalis NOT DETECTED NOT DETECTED   Enterococcus Faecium NOT DETECTED NOT DETECTED   Listeria monocytogenes NOT DETECTED NOT DETECTED   Staphylococcus species DETECTED (A) NOT DETECTED   Staphylococcus aureus (BCID) NOT DETECTED NOT DETECTED   Staphylococcus epidermidis NOT DETECTED NOT DETECTED   Staphylococcus lugdunensis NOT DETECTED NOT DETECTED   Streptococcus species NOT DETECTED NOT DETECTED   Streptococcus agalactiae NOT DETECTED NOT DETECTED   Streptococcus pneumoniae NOT DETECTED NOT DETECTED   Streptococcus pyogenes NOT DETECTED NOT DETECTED   A.calcoaceticus-baumannii NOT DETECTED NOT DETECTED   Bacteroides fragilis NOT DETECTED NOT DETECTED   Enterobacterales NOT DETECTED NOT DETECTED   Enterobacter cloacae complex NOT DETECTED NOT DETECTED   Escherichia coli NOT DETECTED NOT DETECTED   Klebsiella aerogenes NOT DETECTED NOT DETECTED   Klebsiella oxytoca NOT DETECTED NOT DETECTED   Klebsiella pneumoniae NOT DETECTED NOT DETECTED   Proteus species NOT DETECTED NOT DETECTED   Salmonella species NOT DETECTED NOT DETECTED   Serratia marcescens NOT DETECTED NOT DETECTED   Haemophilus influenzae NOT DETECTED NOT DETECTED   Neisseria meningitidis NOT DETECTED NOT DETECTED   Pseudomonas aeruginosa NOT DETECTED NOT DETECTED   Stenotrophomonas maltophilia NOT DETECTED NOT DETECTED   Candida albicans NOT  DETECTED NOT DETECTED   Candida auris NOT DETECTED NOT DETECTED   Candida glabrata NOT DETECTED NOT DETECTED   Candida krusei NOT DETECTED NOT DETECTED   Candida parapsilosis NOT DETECTED NOT DETECTED   Candida tropicalis NOT DETECTED NOT DETECTED   Cryptococcus neoformans/gattii NOT DETECTED NOT DETECTED    [name] 08/16/2022  4:15 AM

## 2022-08-16 NOTE — Hospital Course (Addendum)
Patient is a 62 year old male with with history of diabetes mellitus, hypertension and schizophrenia who is a ward of the state who presented to the emergency department on 3/24 from his long-term care facility with abdominal distention and coffee-ground emesis.  At time of admission, patient noted to be hypotensive with a potassium of 7.4 and acute kidney injury.  Abdominal CT revealed prominent large/small bowel with stool concerning for ileus and patchy infiltrate at lung bases concerning for aspiration pneumonitis.  Initially patient was admitted to the ICU to the critical care service.  Patient was treated with IV fluids by following day, better stabilized and transferred over to the hospitalist service on 3/25.  Constipation was treated with enema and patient initially started on clear liquid diet able to be advanced.  Despite x-rays noting persistent ileus, patient has had a number of bowel movements and tolerating full liquids.  Patient underwent endoscopy on 3/31 by GI, noting grade 1 esophagitis, but otherwise unremarkable findings.  Diet advanced and patient tolerating.

## 2022-08-16 NOTE — Plan of Care (Signed)
  Problem: Clinical Measurements: Goal: Respiratory complications will improve Outcome: Progressing Goal: Cardiovascular complication will be avoided Outcome: Progressing   Problem: Elimination: Goal: Will not experience complications related to bowel motility Outcome: Progressing Goal: Will not experience complications related to urinary retention Outcome: Progressing   Problem: Pain Managment: Goal: General experience of comfort will improve Outcome: Progressing   Problem: Safety: Goal: Ability to remain free from injury will improve Outcome: Progressing   

## 2022-08-17 ENCOUNTER — Inpatient Hospital Stay (HOSPITAL_COMMUNITY): Payer: Medicaid Other

## 2022-08-17 DIAGNOSIS — R933 Abnormal findings on diagnostic imaging of other parts of digestive tract: Secondary | ICD-10-CM | POA: Diagnosis not present

## 2022-08-17 DIAGNOSIS — K5900 Constipation, unspecified: Secondary | ICD-10-CM | POA: Diagnosis not present

## 2022-08-17 DIAGNOSIS — K920 Hematemesis: Secondary | ICD-10-CM | POA: Diagnosis not present

## 2022-08-17 LAB — COMPREHENSIVE METABOLIC PANEL
ALT: 10 U/L (ref 0–44)
AST: 21 U/L (ref 15–41)
Albumin: 2.8 g/dL — ABNORMAL LOW (ref 3.5–5.0)
Alkaline Phosphatase: 52 U/L (ref 38–126)
Anion gap: 11 (ref 5–15)
BUN: 26 mg/dL — ABNORMAL HIGH (ref 8–23)
CO2: 21 mmol/L — ABNORMAL LOW (ref 22–32)
Calcium: 8.6 mg/dL — ABNORMAL LOW (ref 8.9–10.3)
Chloride: 108 mmol/L (ref 98–111)
Creatinine, Ser: 1.07 mg/dL (ref 0.61–1.24)
GFR, Estimated: >60 mL/min (ref >60)
Glucose, Bld: 92 mg/dL (ref 70–99)
Potassium: 4.1 mmol/L (ref 3.5–5.1)
Sodium: 140 mmol/L (ref 135–145)
Total Bilirubin: 0.6 mg/dL (ref 0.3–1.2)
Total Protein: 5.3 g/dL — ABNORMAL LOW (ref 6.5–8.1)

## 2022-08-17 LAB — CBC WITH DIFFERENTIAL/PLATELET
Abs Immature Granulocytes: 0.04 10*3/uL (ref 0.00–0.07)
Basophils Absolute: 0 10*3/uL (ref 0.0–0.1)
Basophils Relative: 0 %
Eosinophils Absolute: 0 10*3/uL (ref 0.0–0.5)
Eosinophils Relative: 1 %
HCT: 24.3 % — ABNORMAL LOW (ref 39.0–52.0)
Hemoglobin: 7.9 g/dL — ABNORMAL LOW (ref 13.0–17.0)
Immature Granulocytes: 1 %
Lymphocytes Relative: 15 %
Lymphs Abs: 1.3 10*3/uL (ref 0.7–4.0)
MCH: 28.3 pg (ref 26.0–34.0)
MCHC: 32.5 g/dL (ref 30.0–36.0)
MCV: 87.1 fL (ref 80.0–100.0)
Monocytes Absolute: 1.2 10*3/uL — ABNORMAL HIGH (ref 0.1–1.0)
Monocytes Relative: 13 %
Neutro Abs: 6.2 10*3/uL (ref 1.7–7.7)
Neutrophils Relative %: 70 %
Platelets: 147 10*3/uL — ABNORMAL LOW (ref 150–400)
RBC: 2.79 MIL/uL — ABNORMAL LOW (ref 4.22–5.81)
RDW: 15 % (ref 11.5–15.5)
WBC: 8.8 10*3/uL (ref 4.0–10.5)
nRBC: 0 % (ref 0.0–0.2)

## 2022-08-17 LAB — LACTIC ACID, PLASMA: Lactic Acid, Venous: 1.1 mmol/L (ref 0.5–1.9)

## 2022-08-17 LAB — MAGNESIUM: Magnesium: 1.3 mg/dL — ABNORMAL LOW (ref 1.7–2.4)

## 2022-08-17 MED ORDER — SIMETHICONE 80 MG PO CHEW
80.0000 mg | CHEWABLE_TABLET | Freq: Four times a day (QID) | ORAL | Status: DC
  Administered 2022-08-17 – 2022-08-23 (×22): 80 mg via ORAL
  Filled 2022-08-17 (×22): qty 1

## 2022-08-17 MED ORDER — HYDRALAZINE HCL 20 MG/ML IJ SOLN
10.0000 mg | INTRAMUSCULAR | Status: DC | PRN
  Administered 2022-08-18 – 2022-08-22 (×2): 10 mg via INTRAVENOUS
  Filled 2022-08-17 (×2): qty 1

## 2022-08-17 MED ORDER — MAGNESIUM SULFATE 2 GM/50ML IV SOLN
2.0000 g | Freq: Once | INTRAVENOUS | Status: AC
  Administered 2022-08-17: 2 g via INTRAVENOUS
  Filled 2022-08-17: qty 50

## 2022-08-17 MED ORDER — POLYETHYLENE GLYCOL 3350 17 G PO PACK
17.0000 g | PACK | Freq: Two times a day (BID) | ORAL | Status: DC
  Administered 2022-08-17 – 2022-08-23 (×10): 17 g via ORAL
  Filled 2022-08-17 (×10): qty 1

## 2022-08-17 MED ORDER — BOOST / RESOURCE BREEZE PO LIQD CUSTOM
1.0000 | Freq: Three times a day (TID) | ORAL | Status: DC
  Administered 2022-08-17 – 2022-08-23 (×16): 1 via ORAL

## 2022-08-17 MED ORDER — LACTATED RINGERS IV SOLN: INTRAVENOUS | Status: DC

## 2022-08-17 MED ORDER — BISACODYL 10 MG RE SUPP
10.0000 mg | Freq: Once | RECTAL | Status: AC
  Administered 2022-08-17: 10 mg via RECTAL
  Filled 2022-08-17: qty 1

## 2022-08-17 NOTE — Progress Notes (Signed)
Patient continuously getting out of bed, and pulling connections and tubings. Soft mittens on. Had 3 large bowel movements, no bleeding.

## 2022-08-17 NOTE — Plan of Care (Signed)
  Problem: Clinical Measurements: Goal: Will remain free from infection Outcome: Progressing Goal: Respiratory complications will improve Outcome: Progressing   Problem: Coping: Goal: Level of anxiety will decrease Outcome: Progressing   Problem: Elimination: Goal: Will not experience complications related to bowel motility Outcome: Progressing Goal: Will not experience complications related to urinary retention Outcome: Progressing

## 2022-08-17 NOTE — Progress Notes (Addendum)
      Progress Note   Subjective  Patient reports he is hungry. Reportedly had 3 bowel movements today.    Objective   Vital signs in last 24 hours: Temp:  [98 F (36.7 C)-98.9 F (37.2 C)] 98.4 F (36.9 C) (03/26 1535) Pulse Rate:  [85-104] 104 (03/26 1123) Resp:  [17-22] 21 (03/26 1535) BP: (130-153)/(82-115) 130/93 (03/26 1535) SpO2:  [83 %-100 %] 93 % (03/26 1123) Last BM Date : 08/17/22 General:    AA male in NAD Abdomen:  Soft, nontender, somewhat distended but improved.  Psych:  Cooperative. Normal mood and affect.  Intake/Output from previous day: 03/25 0701 - 03/26 0700 In: 839.4 [I.V.:739.4; IV Piggyback:100] Out: 1150 [Urine:1150] Intake/Output this shift: Total I/O In: 343.9 [I.V.:243.9; IV Piggyback:100] Out: -   Lab Results: Recent Labs    08/16/22 0600 08/16/22 2155 08/17/22 0720  WBC 8.1 8.5 8.8  HGB 8.7* 9.1* 7.9*  HCT 26.4* 27.9* 24.3*  PLT 174 142* 147*   BMET Recent Labs    08/15/22 0640 08/15/22 0652 08/15/22 0938 08/16/22 0600 08/17/22 0720  NA 136   < > 142 138 140  K 7.4*   < > 5.0 4.3 4.1  CL 105   < > 110 107 108  CO2 12*  --   --  22 21*  GLUCOSE 96   < > 90 128* 92  BUN 58*   < > 46* 42* 26*  CREATININE 2.83*   < > 2.40* 1.60* 1.07  CALCIUM 9.7  --   --  9.3 8.6*   < > = values in this interval not displayed.   LFT Recent Labs    08/17/22 0720  PROT 5.3*  ALBUMIN 2.8*  AST 21  ALT 10  ALKPHOS 52  BILITOT 0.6   PT/INR Recent Labs    08/15/22 0736  LABPROT 14.4  INR 1.1    Studies/Results:    Assessment / Plan:    62 y/o male here with the following:  Coffee ground emesis Anemia Ileus   Admitted with coffee ground emesis and altered mental status with some abdominal distension.  Initially with lactic acidosis, possible aspiration pneumonia. AKI with hyperkalemia this continues to improve. Mg is low and getting that repleted. Xray shows some improvement, he had 3 bowel movements today without blood.  Enema given yesterday.  Hopefully as he recovers from his metabolic derangements his ileus will improve with time. He is feeling better, asking for something to eat. Discussed with Dr. Posey Pronto. We will try clear liquids tonight, add some Miralax for constipation, continue dulcolax for constipation. He needs to be on a good bowel regimen.  At some point can do EGD in upcoming days to evaluate prior hematemesis but he is not ready for it now. Hgb has drifted down but no overt bleeding.   Call with questions, continue PPI and supportive care for now.  Jolly Mango, MD [facility]

## 2022-08-17 NOTE — Progress Notes (Signed)
[facility] Progress Note Patient: Ronald Roman [id number] DOB: 27-Apr-1961 DOA: 08/15/2022  DOS: the patient was seen and examined on 08/17/2022  Brief hospital course: PMH of HTN, type II DM, GERD, schizophrenia, HLD, OSA, seizure present to the hospital with complaints of coffee-ground emesis as well as abdominal distention. Found to have partial SBO versus ileus as well as possible aspiration pneumonitis. Initially admitted to the ICU.  GI was consulted. Assessment and Plan: Hypotension from hypovolemia. Treated with IV fluid with improvement. Will continue IV fluid for now.  Ileus. Partial bowel obstruction.  Severe constipation GI consulted appreciate assistance. Patient had coffee colored emesis prior to admission. Currently on Protonix twice daily. Appreciate GI assistance.  Severe constipation. Treated with enema.  Improving.  Used suppository.  MiraLAX initiated.  Will initiate clear liquid diet after discussion with GI.  Boost added.  Monitor for improvement. Continue simethicone. Will monitor.  AKI. Currently being treated with IV fluids. Will monitor.  Aspiration pneumonitis. Currently no evidence of bacterial infection. Will monitor closely. Low threshold to initiate antibiotics.  Poor IV access. Patient had femoral central line placed yesterday at the time of admission. Currently requesting IV team to place a PIV so that we can remove the central line. Will monitor.  Type 2 diabetes mellitus, uncontrolled with hyperglycemia without long-term insulin use. Currently Scale insulin.  HTN. Blood pressure was soft therefore currently blood pressure medications on hold. Monitor.  History of schizophrenia. History of seizures. On IV Keppra for now. Holding home regimen for now.  Hyperammonemia.  Likely lab better Ammonia level was 200 at the time of admission. Most likely lab error as the recheck is 12.  Lactic acidosis. Resolved. Lactic  acid Continue with IV hydration.   Subjective: Reports some nausea.  Continues to have some BM.  No fever no chills.  Abdomen still distended.  Reports no abdominal pain.  No shortness of breath no cough.  Physical Exam: Clear to auscultation. S1-S2 present. Bowel sound present but sluggish. Abdomen distended. Mild epigastric distention still present. Alert and oriented to self only.  No focal deficit.  Intermittently agitated.  Data Reviewed: I have Reviewed nursing notes, Vitals, and Lab results. Reviewed CBC BMP and x-ray abdomen. Reordered CBC and BMP. Discussed with GI.  Disposition: Status is: Inpatient Remains inpatient appropriate because: Required IV fluid will require further IV therapy.  Place and maintain sequential compression device Start: 08/15/22 1043   Family Communication: No one at bedside Level of care: Progressive   Vitals:   08/17/22 0431 08/17/22 0749 08/17/22 1123 08/17/22 1535  BP: (!) 149/85 (!) 150/83 132/82 (!) 130/93  Pulse:  94 (!) 104   Resp:  18 18 (!) 21  Temp:  98.9 F (37.2 C) 98.5 F (36.9 C) 98.4 F (36.9 C)  TempSrc:  Oral Oral Oral  SpO2:  (!) 83% 93%   Weight:      Height:         Author: Berle Mull, MD 08/17/2022 6:51 PM  Please look on [email or web address] to find out who is on call.

## 2022-08-18 DIAGNOSIS — F209 Schizophrenia, unspecified: Secondary | ICD-10-CM

## 2022-08-18 DIAGNOSIS — K920 Hematemesis: Secondary | ICD-10-CM | POA: Diagnosis not present

## 2022-08-18 DIAGNOSIS — K5900 Constipation, unspecified: Secondary | ICD-10-CM | POA: Diagnosis not present

## 2022-08-18 DIAGNOSIS — R933 Abnormal findings on diagnostic imaging of other parts of digestive tract: Secondary | ICD-10-CM | POA: Diagnosis not present

## 2022-08-18 LAB — COMPREHENSIVE METABOLIC PANEL
ALT: 12 U/L (ref 0–44)
AST: 20 U/L (ref 15–41)
Albumin: 3.2 g/dL — ABNORMAL LOW (ref 3.5–5.0)
Alkaline Phosphatase: 52 U/L (ref 38–126)
Anion gap: 10 (ref 5–15)
BUN: 17 mg/dL (ref 8–23)
CO2: 23 mmol/L (ref 22–32)
Calcium: 9 mg/dL (ref 8.9–10.3)
Chloride: 104 mmol/L (ref 98–111)
Creatinine, Ser: 1.05 mg/dL (ref 0.61–1.24)
GFR, Estimated: >60 mL/min (ref >60)
Glucose, Bld: 104 mg/dL — ABNORMAL HIGH (ref 70–99)
Potassium: 3.9 mmol/L (ref 3.5–5.1)
Sodium: 137 mmol/L (ref 135–145)
Total Bilirubin: 0.7 mg/dL (ref 0.3–1.2)
Total Protein: 5.7 g/dL — ABNORMAL LOW (ref 6.5–8.1)

## 2022-08-18 LAB — CBC WITH DIFFERENTIAL/PLATELET
Abs Immature Granulocytes: 0.04 10*3/uL (ref 0.00–0.07)
Basophils Absolute: 0 10*3/uL (ref 0.0–0.1)
Basophils Relative: 0 %
Eosinophils Absolute: 0.2 10*3/uL (ref 0.0–0.5)
Eosinophils Relative: 2 %
HCT: 26 % — ABNORMAL LOW (ref 39.0–52.0)
Hemoglobin: 8.5 g/dL — ABNORMAL LOW (ref 13.0–17.0)
Immature Granulocytes: 0 %
Lymphocytes Relative: 20 %
Lymphs Abs: 1.9 10*3/uL (ref 0.7–4.0)
MCH: 28.4 pg (ref 26.0–34.0)
MCHC: 32.7 g/dL (ref 30.0–36.0)
MCV: 87 fL (ref 80.0–100.0)
Monocytes Absolute: 1.1 10*3/uL — ABNORMAL HIGH (ref 0.1–1.0)
Monocytes Relative: 12 %
Neutro Abs: 6 10*3/uL (ref 1.7–7.7)
Neutrophils Relative %: 66 %
Platelets: 165 10*3/uL (ref 150–400)
RBC: 2.99 MIL/uL — ABNORMAL LOW (ref 4.22–5.81)
RDW: 14.9 % (ref 11.5–15.5)
WBC: 9.2 10*3/uL (ref 4.0–10.5)
nRBC: 0 % (ref 0.0–0.2)

## 2022-08-18 LAB — CULTURE, BLOOD (ROUTINE X 2)

## 2022-08-18 LAB — MAGNESIUM: Magnesium: 1.5 mg/dL — ABNORMAL LOW (ref 1.7–2.4)

## 2022-08-18 MED ORDER — ORAL CARE MOUTH RINSE: 15.0000 mL | OROMUCOSAL | Status: DC | PRN

## 2022-08-18 MED ORDER — BISACODYL 10 MG RE SUPP
10.0000 mg | Freq: Once | RECTAL | Status: AC
  Administered 2022-08-18: 10 mg via RECTAL
  Filled 2022-08-18: qty 1

## 2022-08-18 MED ORDER — SORBITOL 70 % SOLN
400.0000 mL | TOPICAL_OIL | Freq: Once | ORAL | Status: AC
  Administered 2022-08-18: 400 mL via RECTAL
  Filled 2022-08-18: qty 120

## 2022-08-18 MED ORDER — MAGNESIUM SULFATE 2 GM/50ML IV SOLN
2.0000 g | Freq: Once | INTRAVENOUS | Status: AC
  Administered 2022-08-18: 2 g via INTRAVENOUS
  Filled 2022-08-18: qty 50

## 2022-08-18 NOTE — Progress Notes (Signed)
Patient had one bowel movement at 1600 and again had a large bowel movement after enema, has less distention than before

## 2022-08-18 NOTE — Plan of Care (Signed)
  Problem: Clinical Measurements: Goal: Respiratory complications will improve Outcome: Progressing Goal: Cardiovascular complication will be avoided Outcome: Progressing   Problem: Nutrition: Goal: Adequate nutrition will be maintained Outcome: Progressing   Problem: Elimination: Goal: Will not experience complications related to urinary retention Outcome: Progressing   

## 2022-08-18 NOTE — Progress Notes (Signed)
[facility] Progress Note Patient: Ronald Roman [id number] DOB: [birth year] DOA: 08/15/2022  DOS: the patient was seen and examined on 08/18/2022  Brief hospital course: Patient is a 62 year old male with with history of diabetes mellitus, hypertension and schizophrenia who is a ward of the state who presented to the emergency department on 3/24 from his long-term care facility with abdominal distention and coffee-ground emesis.  At time of admission, patient noted to be hypotensive with a potassium of 7.4 and acute kidney injury.  Abdominal CT revealed prominent large/small bowel with stool concerning for ileus and patchy infiltrate at lung bases concerning for aspiration pneumonitis.  Initially patient was admitted to the ICU to the critical care service.  Patient was treated with IV fluids by following day, better stabilized and transferred over to the hospitalist service on 3/25.  Constipation was treated with enema and patient started on clear liquid diet.  Patient had a few bowel movements on 3/26, but nothing overnight or on 3/27.  Once patient's ileus improves, plan is for EGD by gastroenterology.  Assessment and Plan: Hypotension from hypovolemia-resolved. Resolved with IV fluids.  Ileus vs partial bowel obstruction Secondary to severe constipation.  Appreciate GI help.  Improving, but not yet resolved.  Once ileus has resolved, plan is for EGD.  Patient tolerating clear liquids  Severe constipation. Treated with enema.  Improving.  Used suppository.  Plan is for more smog enema, Dulcolax and MiraLAX.    Hypomagnesemia Replaced as needed  AKI-resolved Treated with IV fluid  Aspiration pneumonitis. Currently no evidence of bacterial infection. Will monitor closely.  Type 2 diabetes mellitus, uncontrolled with hyperglycemia without long-term insulin use. Sliding scale  HTN. Blood pressure was soft therefore currently blood pressure medications on  hold. Monitor.  History of schizophrenia. From group home, ward of state  History of seizures. On IV Keppra for now. Holding home regimen for now.  Hyperammonemia.  Likely lab error  Lactic acidosis-resolved. Secondary to intravascular volume depletion.  Not felt to be sepsis.  Sepsis ruled out.   Subjective: Reports some nausea.  Continues to have some BM.  No fever no chills.  Abdomen still distended.  Reports no abdominal pain.  No shortness of breath no cough.  Physical Exam: General: Oriented x 1-2, no acute distress, Vascular: Regular rate and rhythm, S1-S2 Lungs: Clear to auscultation bilaterally Abdomen: Soft, distended, few bowel sounds, non-tender Extremities: No clubbing or cyanosis or edema  Data Reviewed: Magnesium of 1.5, hemoglobin up to 8.5  Disposition: Status is: Inpatient Remains inpatient appropriate because: -Resolution of ileus -EGD by GI  Place and maintain sequential compression device Start: 08/15/22 1043   Family Communication: Ward of state Level of care: Progressive   Vitals:   08/17/22 2317 08/18/22 0343 08/18/22 0755 08/18/22 1145  BP: 123/75 138/89 (!) 147/115 (!) 139/91  Pulse: 98 77 100 72  Resp: 18 20 18 18   Temp: 99.1 F (37.3 C) 98.7 F (37.1 C) 98.8 F (37.1 C) 98.3 F (36.8 C)  TempSrc: Oral Axillary Oral Oral  SpO2: 99% 100% (!) 86% 100%  Weight:      Height:         Author: Annita Brod, MD 08/18/2022 1:44 PM  Please look on [email or web address] to find out who is on call.

## 2022-08-18 NOTE — Progress Notes (Signed)
Progress Note   Subjective  Tolerating clears. 3 BMs yesterday but nothing over night or today. Abdomen about the same. No bleeding symptoms.   Objective   Vital signs in last 24 hours: Temp:  [98.3 F (36.8 C)-99.4 F (37.4 C)] 98.3 F (36.8 C) (03/27 1145) Pulse Rate:  [70-100] 72 (03/27 1145) Resp:  [18-21] 18 (03/27 1145) BP: (123-147)/(75-115) 139/91 (03/27 1145) SpO2:  [86 %-100 %] 100 % (03/27 1145) Last BM Date : 08/17/22 General:    AA male in NAD Abdomen:  Soft, nontender but distended.   Intake/Output from previous day: 03/26 0701 - 03/27 0700 In: 643.9 [P.O.:300; I.V.:243.9; IV Piggyback:100] Out: 950 [Urine:950] Intake/Output this shift: No intake/output data recorded.  Lab Results: Recent Labs    08/16/22 2155 08/17/22 0720 08/18/22 0408  WBC 8.5 8.8 9.2  HGB 9.1* 7.9* 8.5*  HCT 27.9* 24.3* 26.0*  PLT 142* 147* 165   BMET Recent Labs    08/16/22 0600 08/17/22 0720 08/18/22 0408  NA 138 140 137  K 4.3 4.1 3.9  CL 107 108 104  CO2 22 21* 23  GLUCOSE 128* 92 104*  BUN 42* 26* 17  CREATININE 1.60* 1.07 1.05  CALCIUM 9.3 8.6* 9.0   LFT Recent Labs    08/18/22 0408  PROT 5.7*  ALBUMIN 3.2*  AST 20  ALT 12  ALKPHOS 52  BILITOT 0.7   PT/INR No results for input(s): "LABPROT", "INR" in the last 72 hours.  Studies/Results: DG Abd Portable 1V  Result Date: 08/17/2022 CLINICAL DATA:  Abdominal distention, bowel obstruction EXAM: PORTABLE ABDOMEN - 1 VIEW COMPARISON:  Previous studies including the examination of 08/16/2022 FINDINGS: There is interval clearing of gastric distention. There is dilation of small-bowel loops measuring up to 4.5 cm in diameter. Gas is present in ascending and transverse colon. There is no distention of the rectosigmoid. IMPRESSION: Dilation of small-bowel loops may suggest ileus or partial obstruction. There is interval decrease in gastric distention. Electronically Signed   By: Elmer Picker M.D.   On:  08/17/2022 13:27   DG CHEST PORT 1 VIEW  Result Date: 08/16/2022 CLINICAL DATA:  SOB (shortness of breath). Abdominal distension. Ileus ([place]). EXAM: PORTABLE CHEST 1 VIEW COMPARISON:  Chest radiograph March 24, 24. FINDINGS: Mild streaky bibasilar opacities. No confluent consolidation. No visible pleural effusions or pneumothorax. Cardiomediastinal silhouette is unchanged. IMPRESSION: Mild streaky bibasilar opacities, which could represent atelectasis, aspiration and/or pneumonia. Electronically Signed   By: Margaretha Sheffield M.D.   On: 08/16/2022 14:48   DG Abd Portable 1V  Result Date: 08/16/2022 CLINICAL DATA:  Abdominal distention EXAM: PORTABLE ABDOMEN - 1 VIEW COMPARISON:  Previous studies including the examination done earlier today FINDINGS: There is moderate to marked gaseous distention of stomach with interval worsening. There are gas-filled small bowel loops. Small bowel loops measure up to 4.8 cm in diameter. Gas and stool are present in colon. IMPRESSION: There is gastric distention. There is dilation of small bowel loops filled with gas. Gas and stool are present in the colon. These findings appear more prominent, possibly suggesting ileus or partial obstruction. Electronically Signed   By: Elmer Picker M.D.   On: 08/16/2022 14:47       Assessment / Plan:    62 y/o male here with the following:   Coffee ground emesis Anemia Ileus   Admitted with coffee ground emesis and altered mental status, with some abdominal distension.  Initially with lactic acidosis, suspected aspiration pneumonia.  AKI with hyperkalemia, Mg has been low.   His metabolic disturbances continue to improve / resolve however his ileus has been taking some time to resolved. CT did not show any obstruction. He had a few BMs yesterday but nothing overnight or today. He remains with some distension, but no recurrent emesis, etc.    Hopefully as he recovers from his metabolic derangements his ileus will  improve with time. He is in restraints - would be good to get him ambulatory to help stimulate his bowels to function better. Will give another SMOG enema, dulcolax, and Miralax today - he may need bowel prep. Tolerating clears.  Once his ileus is improved we can do EGD but he is not ready for it now.    Call with questions, continue PPI and supportive care for now.   Jolly Mango, MD [facility]

## 2022-08-19 ENCOUNTER — Inpatient Hospital Stay (HOSPITAL_COMMUNITY): Payer: Medicaid Other

## 2022-08-19 DIAGNOSIS — R933 Abnormal findings on diagnostic imaging of other parts of digestive tract: Secondary | ICD-10-CM | POA: Diagnosis not present

## 2022-08-19 DIAGNOSIS — K920 Hematemesis: Secondary | ICD-10-CM | POA: Diagnosis not present

## 2022-08-19 DIAGNOSIS — F209 Schizophrenia, unspecified: Secondary | ICD-10-CM | POA: Diagnosis not present

## 2022-08-19 DIAGNOSIS — K5900 Constipation, unspecified: Secondary | ICD-10-CM | POA: Diagnosis not present

## 2022-08-19 LAB — COMPREHENSIVE METABOLIC PANEL
ALT: 28 U/L (ref 0–44)
AST: 34 U/L (ref 15–41)
Albumin: 3.2 g/dL — ABNORMAL LOW (ref 3.5–5.0)
Alkaline Phosphatase: 65 U/L (ref 38–126)
Anion gap: 12 (ref 5–15)
BUN: 13 mg/dL (ref 8–23)
CO2: 23 mmol/L (ref 22–32)
Calcium: 9.1 mg/dL (ref 8.9–10.3)
Chloride: 105 mmol/L (ref 98–111)
Creatinine, Ser: 0.96 mg/dL (ref 0.61–1.24)
GFR, Estimated: >60 mL/min (ref >60)
Glucose, Bld: 106 mg/dL — ABNORMAL HIGH (ref 70–99)
Potassium: 3.6 mmol/L (ref 3.5–5.1)
Sodium: 140 mmol/L (ref 135–145)
Total Bilirubin: 0.5 mg/dL (ref 0.3–1.2)
Total Protein: 5.8 g/dL — ABNORMAL LOW (ref 6.5–8.1)

## 2022-08-19 LAB — CBC WITH DIFFERENTIAL/PLATELET
Abs Immature Granulocytes: 0.01 10*3/uL (ref 0.00–0.07)
Basophils Absolute: 0 10*3/uL (ref 0.0–0.1)
Basophils Relative: 0 %
Eosinophils Absolute: 0.2 10*3/uL (ref 0.0–0.5)
Eosinophils Relative: 2 %
HCT: 26.5 % — ABNORMAL LOW (ref 39.0–52.0)
Hemoglobin: 8.7 g/dL — ABNORMAL LOW (ref 13.0–17.0)
Immature Granulocytes: 0 %
Lymphocytes Relative: 21 %
Lymphs Abs: 1.7 10*3/uL (ref 0.7–4.0)
MCH: 28.5 pg (ref 26.0–34.0)
MCHC: 32.8 g/dL (ref 30.0–36.0)
MCV: 86.9 fL (ref 80.0–100.0)
Monocytes Absolute: 1 10*3/uL (ref 0.1–1.0)
Monocytes Relative: 13 %
Neutro Abs: 5 10*3/uL (ref 1.7–7.7)
Neutrophils Relative %: 64 %
Platelets: 180 10*3/uL (ref 150–400)
RBC: 3.05 MIL/uL — ABNORMAL LOW (ref 4.22–5.81)
RDW: 14.8 % (ref 11.5–15.5)
WBC: 7.9 10*3/uL (ref 4.0–10.5)
nRBC: 0 % (ref 0.0–0.2)

## 2022-08-19 LAB — MAGNESIUM: Magnesium: 1.5 mg/dL — ABNORMAL LOW (ref 1.7–2.4)

## 2022-08-19 MED ORDER — SORBITOL 70 % SOLN
400.0000 mL | TOPICAL_OIL | Freq: Once | ORAL | Status: AC
  Administered 2022-08-19: 400 mL via RECTAL
  Filled 2022-08-19: qty 120

## 2022-08-19 MED ORDER — ORAL CARE MOUTH RINSE: 15.0000 mL | OROMUCOSAL | Status: DC | PRN

## 2022-08-19 MED ORDER — MAGNESIUM SULFATE 2 GM/50ML IV SOLN
2.0000 g | Freq: Once | INTRAVENOUS | Status: AC
  Administered 2022-08-19: 2 g via INTRAVENOUS
  Filled 2022-08-19: qty 50

## 2022-08-19 MED ORDER — BISACODYL 10 MG RE SUPP
10.0000 mg | Freq: Once | RECTAL | Status: AC
  Administered 2022-08-19: 10 mg via RECTAL
  Filled 2022-08-19: qty 1

## 2022-08-19 NOTE — Progress Notes (Signed)
[facility] Progress Note Patient: Ronald Roman [id number] DOB: Jun 28, 1960 DOA: 08/15/2022  DOS: the patient was seen and examined on 08/19/2022  Brief hospital course: Patient is a 62 year old male with with history of diabetes mellitus, hypertension and schizophrenia who is a ward of the state who presented to the emergency department on 3/24 from his long-term care facility with abdominal distention and coffee-ground emesis.  At time of admission, patient noted to be hypotensive with a potassium of 7.4 and acute kidney injury.  Abdominal CT revealed prominent large/small bowel with stool concerning for ileus and patchy infiltrate at lung bases concerning for aspiration pneumonitis.  Initially patient was admitted to the ICU to the critical care service.  Patient was treated with IV fluids by following day, better stabilized and transferred over to the hospitalist service on 3/25.  Constipation was treated with enema and patient started on clear liquid diet.  Patient had a few bowel movements on 3/26, but nothing overnight or on 3/27.  Once patient's ileus improves, plan is for EGD by gastroenterology.  Assessment and Plan: Hypotension from hypovolemia-resolved. Resolved with IV fluids.  Ileus vs partial bowel obstruction Secondary to severe constipation.  Appreciate GI help.  Improving, but not yet resolved.  Once ileus has resolved, plan is for EGD.  Patient tolerating clear liquids.  Follow-up abdominal x-ray in the morning.  Severe constipation. Treated with enema.  Improving.  Used suppository.  Plan is for more smog enema, Dulcolax and MiraLAX.  Patient had several large bowel movements in the last 24 hours.  Repeating abdominal x-ray tomorrow morning.  Hypomagnesemia Replaced as needed  AKI-resolved Treated with IV fluid  Aspiration pneumonitis. Currently no evidence of bacterial infection. Will monitor closely.  Type 2 diabetes mellitus, uncontrolled with  hyperglycemia without long-term insulin use. Sliding scale  HTN. Blood pressure was soft therefore currently blood pressure medications on hold. Monitor.  History of schizophrenia. From group home, ward of state  History of seizures. On IV Keppra for now. Holding home regimen for now.  Hyperammonemia.  Likely lab error  Lactic acidosis-resolved. Secondary to intravascular volume depletion.  Not felt to be sepsis.  Sepsis ruled out.   Subjective: Nausea better, positive bowel movements  Physical Exam: General: Oriented x 1-2, no acute distress, Vascular: Regular rate and rhythm, S1-S2 Lungs: Clear to auscultation bilaterally Abdomen: Soft, decreased distention, few bowel sounds, non-tender Extremities: No clubbing or cyanosis or edema  Data Reviewed: Magnesium of 1.5, hemoglobin 8.7  Disposition: Status is: Inpatient Remains inpatient appropriate because: -Resolution of ileus -EGD by GI  Place and maintain sequential compression device Start: 08/15/22 1043   Family Communication: Ward of state Level of care: Progressive   Vitals:   08/19/22 0323 08/19/22 0807 08/19/22 0849 08/19/22 1203  BP: (!) 133/105 (!) 140/84 (!) 138/95 131/87  Pulse: 76  85 100  Resp: 15 16 16 18   Temp: 98 F (36.7 C)  99.1 F (37.3 C) 98.9 F (37.2 C)  TempSrc: Axillary  Oral Oral  SpO2: 100% 99% 100% 96%  Weight:      Height:         Author: Annita Brod, MD 08/19/2022 2:16 PM  Please look on [email or web address] to find out who is on call.

## 2022-08-19 NOTE — Progress Notes (Signed)
Progress Note   Subjective  Enema given yesterday and dulcolax, he did have a few bowel movements and another BM this AM. No vomiting, he denies abdominal pain. Tolerating liquid diet.   Objective   Vital signs in last 24 hours: Temp:  [98 F (36.7 C)-99.1 F (37.3 C)] 99.1 F (37.3 C) (03/28 0849) Pulse Rate:  [60-100] 85 (03/28 0849) Resp:  [15-20] 16 (03/28 0849) BP: (131-148)/(84-105) 138/95 (03/28 0849) SpO2:  [93 %-100 %] 100 % (03/28 0849) Last BM Date : 08/18/22 General:    AA male in NAD HAbdomen:  Soft, distended - seems slightly improved from yesterday, no pain.  Extremities:  Without edema. Psych:  Cooperative. Normal mood and affect.  Intake/Output from previous day: 03/27 0701 - 03/28 0700 In: 477 [P.O.:477] Out: 1000 [Urine:1000] Intake/Output this shift: Total I/O In: 100 [IV Piggyback:100] Out: -   Lab Results: Recent Labs    08/17/22 0720 08/18/22 0408 08/19/22 0733  WBC 8.8 9.2 7.9  HGB 7.9* 8.5* 8.7*  HCT 24.3* 26.0* 26.5*  PLT 147* 165 180   BMET Recent Labs    08/17/22 0720 08/18/22 0408 08/19/22 0733  NA 140 137 140  K 4.1 3.9 3.6  CL 108 104 105  CO2 21* 23 23  GLUCOSE 92 104* 106*  BUN 26* 17 13  CREATININE 1.07 1.05 0.96  CALCIUM 8.6* 9.0 9.1   LFT Recent Labs    08/19/22 0733  PROT 5.8*  ALBUMIN 3.2*  AST 34  ALT 28  ALKPHOS 65  BILITOT 0.5   PT/INR No results for input(s): "LABPROT", "INR" in the last 72 hours.  Studies/Results: DG Abd Portable 1V  Result Date: 08/17/2022 CLINICAL DATA:  Abdominal distention, bowel obstruction EXAM: PORTABLE ABDOMEN - 1 VIEW COMPARISON:  Previous studies including the examination of 08/16/2022 FINDINGS: There is interval clearing of gastric distention. There is dilation of small-bowel loops measuring up to 4.5 cm in diameter. Gas is present in ascending and transverse colon. There is no distention of the rectosigmoid. IMPRESSION: Dilation of small-bowel loops may suggest  ileus or partial obstruction. There is interval decrease in gastric distention. Electronically Signed   By: Elmer Picker M.D.   On: 08/17/2022 13:27       Assessment / Plan:    62 y/o male here with the following:   Coffee ground emesis Anemia Ileus   Admitted initially with coffee ground emesis and altered mental status, with some abdominal distension.  Previously had lactic acidosis, suspected aspiration pneumonia. AKI with hyperkalemia, Mg has been low.    His metabolic disturbances continue to improve / resolve however his ileus has been taking some time to resolve. Prior CT did not show any obstruction. Given another SMOG enema yesterday, dulcolax, and more Miralax - a few BMs overnight and again this AM. His abdomen feels slightly less distended but not significant.    He ultimately would need an EGD for the hematemesis but is not ready for it yet. No further bleeding, on empiric PPI. Once his ileus improves/ resolves we can then do the EGD. Give another SMOG enema today, dulolax and will check another x ray. If improving may give a Miralax prep - he is not obstructed, no vomiting, tolerating clears.   PLAN: - xray abdomen today - continue SMOG enemas, dulcolax, Miralax - may do Miralax bowel prep pending x ray - continue liquid diet - continue PPI - eventual EGD once ileus treated   Call with  questions, Dr. Hilarie Fredrickson to assume GI service tomorrow.  Jolly Mango, MD [facility]

## 2022-08-20 ENCOUNTER — Inpatient Hospital Stay (HOSPITAL_COMMUNITY): Payer: Medicaid Other

## 2022-08-20 DIAGNOSIS — K920 Hematemesis: Secondary | ICD-10-CM | POA: Diagnosis not present

## 2022-08-20 DIAGNOSIS — K5900 Constipation, unspecified: Secondary | ICD-10-CM | POA: Diagnosis not present

## 2022-08-20 DIAGNOSIS — K567 Ileus, unspecified: Secondary | ICD-10-CM | POA: Diagnosis not present

## 2022-08-20 DIAGNOSIS — E876 Hypokalemia: Secondary | ICD-10-CM | POA: Diagnosis not present

## 2022-08-20 DIAGNOSIS — D649 Anemia, unspecified: Secondary | ICD-10-CM | POA: Diagnosis not present

## 2022-08-20 LAB — BASIC METABOLIC PANEL
Anion gap: 12 (ref 5–15)
BUN: 9 mg/dL (ref 8–23)
CO2: 23 mmol/L (ref 22–32)
Calcium: 9 mg/dL (ref 8.9–10.3)
Chloride: 106 mmol/L (ref 98–111)
Creatinine, Ser: 0.9 mg/dL (ref 0.61–1.24)
GFR, Estimated: >60 mL/min (ref >60)
Glucose, Bld: 97 mg/dL (ref 70–99)
Potassium: 3.3 mmol/L — ABNORMAL LOW (ref 3.5–5.1)
Sodium: 141 mmol/L (ref 135–145)

## 2022-08-20 LAB — CULTURE, BLOOD (ROUTINE X 2)
Culture: NO GROWTH
Special Requests: ADEQUATE

## 2022-08-20 LAB — CBC
HCT: 28.3 % — ABNORMAL LOW (ref 39.0–52.0)
Hemoglobin: 9.1 g/dL — ABNORMAL LOW (ref 13.0–17.0)
MCH: 28.3 pg (ref 26.0–34.0)
MCHC: 32.2 g/dL (ref 30.0–36.0)
MCV: 87.9 fL (ref 80.0–100.0)
Platelets: 196 10*3/uL (ref 150–400)
RBC: 3.22 MIL/uL — ABNORMAL LOW (ref 4.22–5.81)
RDW: 14.7 % (ref 11.5–15.5)
WBC: 7.8 10*3/uL (ref 4.0–10.5)
nRBC: 0 % (ref 0.0–0.2)

## 2022-08-20 MED ORDER — POTASSIUM CHLORIDE CRYS ER 20 MEQ PO TBCR
40.0000 meq | EXTENDED_RELEASE_TABLET | Freq: Two times a day (BID) | ORAL | Status: AC
  Administered 2022-08-20 (×2): 40 meq via ORAL
  Filled 2022-08-20 (×3): qty 2

## 2022-08-20 NOTE — Progress Notes (Signed)
This nurse attempted to call legal guardian to get consent signed for patient's procedure that is scheduled tomorrow. This nurse was unable to reach guardian and left voicemail with instructions to call back.

## 2022-08-20 NOTE — Progress Notes (Signed)
Pt has removed 6 PIVs and a CVC this admit. IV team has placed several USGIVs for this patient. In the event this 7th PIV is lost/pulled this patient will need a new plan of care for access. Per floor RN Marland Kitchen IVs have been wrapped and patient has had on mitts, yet patient still manages to pull IVs out. Please consider a new plan such as a sitter, soft wrist restraints, PO medications, or a similar solution. Secure Chat sent to [place] Pink, MD regarding the above.

## 2022-08-20 NOTE — Progress Notes (Signed)
    Progress Note   Assessment    62 year old with coffee-ground emesis, anemia and small/large bowel ileus.   Recommendations  1.  Coffee-ground emesis --no further nausea vomiting.  Hemoglobin stable and in fact slightly better today at 9.1.  Ileus pattern is unchanged though he has had multiple bowel movements yesterday and has good bowel sounds today --Continue twice daily PPI -- Will advance diet to full liquids; n.p.o. after midnight -- EGD tomorrow to exclude significant source of upper GI bleeding  2.  Ileus --mild hypokalemia today, needs replacement.  X-ray without much change though patient does not have significant ileus symptoms.  Unclear etiology though expect this is postinfectious -- Advancing to full liquids -- Continue to replace electrolytes as needed, goal potassium greater than 4.0 -- Move around and out of bed if possible -- Continue MiraLAX -- Reserve reimaging for worsening symptoms/clinical deterioration     Chief Complaint   Per nursing patient had multiple bowel movements yesterday before and after smog enema No bowel movement yet today Patient denies pain and wants to eat solid food if possible  Vital signs in last 24 hours: Temp:  [98 F (36.7 C)-99 F (37.2 C)] 98.2 F (36.8 C) (03/29 1150) Pulse Rate:  [71-104] 82 (03/29 1150) Resp:  [15-18] 18 (03/29 1150) BP: (126-149)/(76-102) 126/88 (03/29 1150) SpO2:  [87 %-100 %] 100 % (03/29 1150) Last BM Date : 08/20/22  Gen: awake, alert, NAD HEENT: anicteric  CV: RRR, no mrg Pulm: CTA b/l Abd: soft, nontender, mildly distended with increased tympany but not tense, active BS throughout Ext: no c/c/e Neuro: nonfocal  Intake/Output from previous day: 03/28 0701 - 03/29 0700 In: 5094.5 [P.O.:1317; I.V.:3677.5; IV Piggyback:100] Out: 2900 [Urine:2900] Intake/Output this shift: Total I/O In: 600 [P.O.:600] Out: -   Lab Results: Recent Labs    08/18/22 0408 08/19/22 0733 08/20/22 0638   WBC 9.2 7.9 7.8  HGB 8.5* 8.7* 9.1*  HCT 26.0* 26.5* 28.3*  PLT 165 180 196   BMET Recent Labs    08/18/22 0408 08/19/22 0733 08/20/22 0638  NA 137 140 141  K 3.9 3.6 3.3*  CL 104 105 106  CO2 23 23 23   GLUCOSE 104* 106* 97  BUN 17 13 9   CREATININE 1.05 0.96 0.90  CALCIUM 9.0 9.1 9.0   LFT Recent Labs    08/19/22 0733  PROT 5.8*  ALBUMIN 3.2*  AST 34  ALT 28  ALKPHOS 65  BILITOT 0.5     Studies/Results: DG Abd Portable 1V  Result Date: 08/20/2022 CLINICAL DATA:  Ileus. EXAM: PORTABLE ABDOMEN - 1 VIEW COMPARISON:  08/19/2022. FINDINGS: 0925 hours. Unchanged diffuse gaseous distention of large and small bowel. No significant bony abnormality. IMPRESSION: Unchanged ileus. Electronically Signed   By: Emmit Alexanders M.D.   On: 08/20/2022 09:34   DG Abd Portable 1V  Result Date: 08/19/2022 CLINICAL DATA:  Ileus, GI bleed EXAM: PORTABLE ABDOMEN - 1 VIEW COMPARISON:  Portable exam 0944 hours compared to 08/17/2022 FINDINGS: Gaseous distention of large and small bowel loops throughout abdomen most consistent with ileus. No bowel wall thickening. Osseous structures unremarkable. IMPRESSION: Persistent ileus. Electronically Signed   By: Lavonia Dana M.D.   On: 08/19/2022 10:20      LOS: 5 days   Jerene Bears, MD 08/20/2022, 1:00 PM See Shea Evans, [facility], to contact our on call provider

## 2022-08-20 NOTE — H&P (View-Only) (Signed)
    Progress Note   Assessment    62 year old with coffee-ground emesis, anemia and small/large bowel ileus.   Recommendations  1.  Coffee-ground emesis --no further nausea vomiting.  Hemoglobin stable and in fact slightly better today at 9.1.  Ileus pattern is unchanged though he has had multiple bowel movements yesterday and has good bowel sounds today --Continue twice daily PPI -- Will advance diet to full liquids; n.p.o. after midnight -- EGD tomorrow to exclude significant source of upper GI bleeding  2.  Ileus --mild hypokalemia today, needs replacement.  X-ray without much change though patient does not have significant ileus symptoms.  Unclear etiology though expect this is postinfectious -- Advancing to full liquids -- Continue to replace electrolytes as needed, goal potassium greater than 4.0 -- Move around and out of bed if possible -- Continue MiraLAX -- Reserve reimaging for worsening symptoms/clinical deterioration     Chief Complaint   Per nursing patient had multiple bowel movements yesterday before and after smog enema No bowel movement yet today Patient denies pain and wants to eat solid food if possible  Vital signs in last 24 hours: Temp:  [98 F (36.7 C)-99 F (37.2 C)] 98.2 F (36.8 C) (03/29 1150) Pulse Rate:  [71-104] 82 (03/29 1150) Resp:  [15-18] 18 (03/29 1150) BP: (126-149)/(76-102) 126/88 (03/29 1150) SpO2:  [87 %-100 %] 100 % (03/29 1150) Last BM Date : 08/20/22  Gen: awake, alert, NAD HEENT: anicteric  CV: RRR, no mrg Pulm: CTA b/l Abd: soft, nontender, mildly distended with increased tympany but not tense, active BS throughout Ext: no c/c/e Neuro: nonfocal  Intake/Output from previous day: 03/28 0701 - 03/29 0700 In: 5094.5 [P.O.:1317; I.V.:3677.5; IV Piggyback:100] Out: 2900 [Urine:2900] Intake/Output this shift: Total I/O In: 600 [P.O.:600] Out: -   Lab Results: Recent Labs    08/18/22 0408 08/19/22 0733 08/20/22 0638   WBC 9.2 7.9 7.8  HGB 8.5* 8.7* 9.1*  HCT 26.0* 26.5* 28.3*  PLT 165 180 196   BMET Recent Labs    08/18/22 0408 08/19/22 0733 08/20/22 0638  NA 137 140 141  K 3.9 3.6 3.3*  CL 104 105 106  CO2 23 23 23   GLUCOSE 104* 106* 97  BUN 17 13 9   CREATININE 1.05 0.96 0.90  CALCIUM 9.0 9.1 9.0   LFT Recent Labs    08/19/22 0733  PROT 5.8*  ALBUMIN 3.2*  AST 34  ALT 28  ALKPHOS 65  BILITOT 0.5     Studies/Results: DG Abd Portable 1V  Result Date: 08/20/2022 CLINICAL DATA:  Ileus. EXAM: PORTABLE ABDOMEN - 1 VIEW COMPARISON:  08/19/2022. FINDINGS: 0925 hours. Unchanged diffuse gaseous distention of large and small bowel. No significant bony abnormality. IMPRESSION: Unchanged ileus. Electronically Signed   By: Emmit Alexanders M.D.   On: 08/20/2022 09:34   DG Abd Portable 1V  Result Date: 08/19/2022 CLINICAL DATA:  Ileus, GI bleed EXAM: PORTABLE ABDOMEN - 1 VIEW COMPARISON:  Portable exam 0944 hours compared to 08/17/2022 FINDINGS: Gaseous distention of large and small bowel loops throughout abdomen most consistent with ileus. No bowel wall thickening. Osseous structures unremarkable. IMPRESSION: Persistent ileus. Electronically Signed   By: Lavonia Dana M.D.   On: 08/19/2022 10:20      LOS: 5 days   Jerene Bears, MD 08/20/2022, 1:00 PM See Shea Evans, [facility], to contact our on call provider

## 2022-08-20 NOTE — Progress Notes (Signed)
[facility] Progress Note Patient: Ronald Roman [id number] DOB: 1960/10/27 DOA: 08/15/2022  DOS: the patient was seen and examined on 08/20/2022  Brief hospital course: Patient is a 62 year old male with with history of diabetes mellitus, hypertension and schizophrenia who is a ward of the state who presented to the emergency department on 3/24 from his long-term care facility with abdominal distention and coffee-ground emesis.  At time of admission, patient noted to be hypotensive with a potassium of 7.4 and acute kidney injury.  Abdominal CT revealed prominent large/small bowel with stool concerning for ileus and patchy infiltrate at lung bases concerning for aspiration pneumonitis.  Initially patient was admitted to the ICU to the critical care service.  Patient was treated with IV fluids by following day, better stabilized and transferred over to the hospitalist service on 3/25.  Constipation was treated with enema and patient started on clear liquid diet.  Patient had a few bowel movements on 3/26, but nothing overnight or on 3/27.  Once patient's ileus improves, plan is for EGD by gastroenterology.  Assessment and Plan: Hypotension from hypovolemia-resolved. Resolved with IV fluids.  Ileus vs partial bowel obstruction Secondary to severe constipation.  Appreciate GI help.  Improving, but not yet resolved.  Once ileus has resolved, plan is for EGD.  Patient tolerating clear liquids.  Although follow-up x-ray notes persistent ileus, patient does not seem to be similarly symptomatic.  GI advancing diet to full liquids.  Severe constipation. Treated with enema.  Improving.  Used suppository.  Plan is for more smog enema, Dulcolax and MiraLAX.  Patient had several large bowel movements in the last 24 hours.   Hypomagnesemia/hypokalemia Replaced as needed.  Keep potassium above 4  AKI-resolved Treated with IV fluid  Aspiration pneumonitis. Currently no evidence of bacterial  infection. Will monitor closely.  Type 2 diabetes mellitus, uncontrolled with hyperglycemia without long-term insulin use. Sliding scale  HTN. Blood pressure was soft therefore currently blood pressure medications on hold. Monitor.  History of schizophrenia. From group home, ward of state  History of seizures. On IV Keppra for now. Holding home regimen for now.  Hyperammonemia.  Likely lab error  Lactic acidosis-resolved. Secondary to intravascular volume depletion.  Not felt to be sepsis.  Sepsis ruled out.   Subjective: No complaints  Physical Exam: General: Oriented x 1-2, no acute distress, Vascular: Regular rate and rhythm, S1-S2 Lungs: Clear to auscultation bilaterally Abdomen: Soft, decreased distention, few bowel sounds, non-tender Extremities: No clubbing or cyanosis or edema  Data Reviewed: Potassium 3.3  Disposition: Status is: Inpatient Remains inpatient appropriate because: -Resolution of ileus -EGD by GI  Place and maintain sequential compression device Start: 08/15/22 1043   Family Communication: Ward of state Level of care: Progressive   Vitals:   08/19/22 2350 08/20/22 0346 08/20/22 0818 08/20/22 1150  BP: (!) 142/90 (!) 145/76 (!) 149/89 126/88  Pulse: 88 71 82 82  Resp: 17 18 15 18   Temp: 99 F (37.2 C) 98 F (36.7 C) 98.8 F (37.1 C) 98.2 F (36.8 C)  TempSrc: Oral Oral Oral Oral  SpO2:  100% 100% 100%  Weight:      Height:         Author: Annita Brod, MD 08/20/2022 2:50 PM  Please look on [email or web address] to find out who is on call.

## 2022-08-21 ENCOUNTER — Encounter (HOSPITAL_COMMUNITY)
Admission: EM | Disposition: A | Discharge status: Skilled Nursing Facility | Payer: Self-pay | Source: Home / Self Care | Attending: Internal Medicine

## 2022-08-21 ENCOUNTER — Inpatient Hospital Stay (HOSPITAL_COMMUNITY): Payer: Medicaid Other | Admitting: Anesthesiology

## 2022-08-21 ENCOUNTER — Encounter (HOSPITAL_COMMUNITY): Payer: Self-pay | Admitting: Pulmonary Disease

## 2022-08-21 DIAGNOSIS — K2100 Gastro-esophageal reflux disease with esophagitis, without bleeding: Secondary | ICD-10-CM

## 2022-08-21 DIAGNOSIS — I10 Essential (primary) hypertension: Secondary | ICD-10-CM

## 2022-08-21 DIAGNOSIS — E875 Hyperkalemia: Secondary | ICD-10-CM

## 2022-08-21 DIAGNOSIS — Z7984 Long term (current) use of oral hypoglycemic drugs: Secondary | ICD-10-CM

## 2022-08-21 DIAGNOSIS — E119 Type 2 diabetes mellitus without complications: Secondary | ICD-10-CM

## 2022-08-21 DIAGNOSIS — F209 Schizophrenia, unspecified: Secondary | ICD-10-CM | POA: Diagnosis not present

## 2022-08-21 DIAGNOSIS — Z87891 Personal history of nicotine dependence: Secondary | ICD-10-CM

## 2022-08-21 DIAGNOSIS — K5900 Constipation, unspecified: Secondary | ICD-10-CM | POA: Diagnosis not present

## 2022-08-21 DIAGNOSIS — K920 Hematemesis: Secondary | ICD-10-CM | POA: Diagnosis not present

## 2022-08-21 HISTORY — PX: ESOPHAGOGASTRODUODENOSCOPY (EGD) WITH PROPOFOL: SHX5813

## 2022-08-21 LAB — BASIC METABOLIC PANEL
Anion gap: 10 (ref 5–15)
BUN: 10 mg/dL (ref 8–23)
CO2: 20 mmol/L — ABNORMAL LOW (ref 22–32)
Calcium: 8.8 mg/dL — ABNORMAL LOW (ref 8.9–10.3)
Chloride: 109 mmol/L (ref 98–111)
Creatinine, Ser: 0.86 mg/dL (ref 0.61–1.24)
GFR, Estimated: >60 mL/min (ref >60)
Glucose, Bld: 106 mg/dL — ABNORMAL HIGH (ref 70–99)
Potassium: 5.8 mmol/L — ABNORMAL HIGH (ref 3.5–5.1)
Sodium: 139 mmol/L (ref 135–145)

## 2022-08-21 LAB — POTASSIUM: Potassium: 3.8 mmol/L (ref 3.5–5.1)

## 2022-08-21 SURGERY — ESOPHAGOGASTRODUODENOSCOPY (EGD) WITH PROPOFOL
Anesthesia: Monitor Anesthesia Care

## 2022-08-21 MED ORDER — PANTOPRAZOLE SODIUM 40 MG PO TBEC
40.0000 mg | DELAYED_RELEASE_TABLET | Freq: Every day | ORAL | Status: DC
  Administered 2022-08-22 – 2022-08-23 (×2): 40 mg via ORAL
  Filled 2022-08-21 (×2): qty 1

## 2022-08-21 MED ORDER — PROPOFOL 500 MG/50ML IV EMUL
INTRAVENOUS | Status: DC | PRN
  Administered 2022-08-21: 100 ug/kg/min via INTRAVENOUS

## 2022-08-21 SURGICAL SUPPLY — 15 items

## 2022-08-21 NOTE — Anesthesia Procedure Notes (Signed)
Procedure Name: MAC Date/Time: 08/21/2022 2:24 PM  Performed by: Heide Scales, CRNAPre-anesthesia Checklist: Patient identified, Emergency Drugs available, Suction available and Patient being monitored Patient Re-evaluated:Patient Re-evaluated prior to induction Oxygen Delivery Method: Nasal cannula Preoxygenation: Pre-oxygenation with 100% oxygen Induction Type: IV induction Dental Injury: Teeth and Oropharynx as per pre-operative assessment

## 2022-08-21 NOTE — Transfer of Care (Signed)
Immediate Anesthesia Transfer of Care Note  Patient: Ronald Roman  Procedure(s) Performed: ESOPHAGOGASTRODUODENOSCOPY (EGD) WITH PROPOFOL  Patient Location: PACU  Anesthesia Type:MAC  Level of Consciousness: drowsy and patient cooperative  Airway & Oxygen Therapy: Patient Spontanous Breathing and Patient connected to nasal cannula oxygen  Post-op Assessment: Report given to RN and Post -op Vital signs reviewed and stable  Post vital signs: Reviewed and stable  Last Vitals:  Vitals Value Taken Time  BP 126/92   Temp    Pulse 115 08/21/22 1450  Resp 20 08/21/22 1450  SpO2 99 % 08/21/22 1450  Vitals shown include unvalidated device data.  Last Pain:  Vitals:   08/21/22 1356  TempSrc: Temporal  PainSc: 0-No pain      Patients Stated Pain Goal: 2 ([id number])  Complications: No notable events documented.

## 2022-08-21 NOTE — Progress Notes (Signed)
Attempted to call [phone] and [phone] to reach patient's legal guardian in order to get consent for EGD, unable to reach anyone at this time. Endoscopy team and attending MD aware.

## 2022-08-21 NOTE — Plan of Care (Signed)

## 2022-08-21 NOTE — Progress Notes (Signed)
[facility] Progress Note Patient: Ronald Roman [id number] DOB: 10-27-1960 DOA: 08/15/2022  DOS: the patient was seen and examined on 08/21/2022  Brief hospital course: Patient is a 62 year old male with with history of diabetes mellitus, hypertension and schizophrenia who is a ward of the state who presented to the emergency department on 3/24 from his long-term care facility with abdominal distention and coffee-ground emesis.  At time of admission, patient noted to be hypotensive with a potassium of 7.4 and acute kidney injury.  Abdominal CT revealed prominent large/small bowel with stool concerning for ileus and patchy infiltrate at lung bases concerning for aspiration pneumonitis.  Initially patient was admitted to the ICU to the critical care service.  Patient was treated with IV fluids by following day, better stabilized and transferred over to the hospitalist service on 3/25.  Constipation was treated with enema and patient initially started on clear liquid diet able to be advanced.  Despite x-rays noting persistent ileus, patient has had a number of bowel movements and tolerating full liquids.  GI plans to take patient for EGD on 3/30.  Assessment and Plan: Hypotension from hypovolemia-resolved. Resolved with IV fluids.  Ileus vs partial bowel obstruction Secondary to severe constipation.  Appreciate GI help.  Improving, but not yet resolved.  Once ileus has resolved, plan is for EGD.  Patient tolerating clear liquids.  Although follow-up x-ray notes persistent ileus, patient does not seem to be similarly symptomatic and has been tolerating full liquids  Severe constipation. Treated with enema.  Much improved with smog enema, Dulcolax and MiraLAX.  Patient had several large bowel movements over the past few days  Hypomagnesemia/hypokalemia Replaced as needed.  Keep potassium above 4 In fact, slightly overtreated and potassium of 5.8 on 3/30.  Recheck labs in the  afternoon  AKI-resolved Treated with IV fluid  Aspiration pneumonitis. Currently no evidence of bacterial infection. Will monitor closely.  Type 2 diabetes mellitus, uncontrolled with hyperglycemia without long-term insulin use. Sliding scale  HTN. Blood pressure was soft therefore currently blood pressure medications on hold. Monitor.  History of schizophrenia. From group home, ward of state  History of seizures. On IV Keppra for now. Holding home regimen for now.  Hyperammonemia.  Likely lab error  Lactic acidosis-resolved. Secondary to intravascular volume depletion.  Not felt to be sepsis.  Sepsis ruled out.   Subjective: Patient with no complaints  Physical Exam: General: Oriented x 1-2, no acute distress, Vascular: Regular rate and rhythm, S1-S2 Lungs: Clear to auscultation bilaterally Abdomen: Soft, decreased distention, few bowel sounds, non-tender Extremities: No clubbing or cyanosis or edema  Data Reviewed: Potassium 5.8  Disposition: Status is: Inpatient Remains inpatient appropriate because: -EGD by GI -Improvement in potassium  Place and maintain sequential compression device Start: 08/15/22 1043   Family Communication: Ward of state Level of care: Progressive   Vitals:   08/20/22 2341 08/21/22 0407 08/21/22 0832 08/21/22 1120  BP: (!) 167/101 (!) 148/101 (!) 140/96 (!) 157/80  Pulse: 90 87 72 78  Resp: 17 17 16 15   Temp: 99.7 F (37.6 C) 99.3 F (37.4 C) (!) 97.4 F (36.3 C) 98.7 F (37.1 C)  TempSrc: Oral Oral Oral Oral  SpO2:   100% 99%  Weight:      Height:         Author: Annita Brod, MD 08/21/2022 1:50 PM  Please look on [email or web address] to find out who is on call.

## 2022-08-21 NOTE — Anesthesia Preprocedure Evaluation (Addendum)
Anesthesia Evaluation  Patient identified by MRN, date of birth, ID band Patient awake    Reviewed: Allergy & Precautions, NPO status , Patient's Chart, lab work & pertinent test results  History of Anesthesia Complications Negative for: history of anesthetic complications  Airway Mallampati: II  TM Distance: >3 FB Neck ROM: Full    Dental  (+) Poor Dentition, Missing, Chipped, Dental Advisory Given   Pulmonary former smoker   breath sounds clear to auscultation       Cardiovascular hypertension, Pt. on medications (-) angina  Rhythm:Regular Rate:Normal     Neuro/Psych Seizures -, Well Controlled,    Depression  Schizophrenia     GI/Hepatic Neg liver ROS,GERD  ,,GI bleed   Endo/Other  diabetes (glu 106), Oral Hypoglycemic Agents    Renal/GU stones     Musculoskeletal   Abdominal   Peds  Hematology  (+) Blood dyscrasia (Hb 9.1), anemia   Anesthesia Other Findings   Reproductive/Obstetrics                             Anesthesia Physical Anesthesia Plan  ASA: 3  Anesthesia Plan: MAC   Post-op Pain Management: Minimal or no pain anticipated   Induction:   PONV Risk Score and Plan: 1 and Treatment may vary due to age or medical condition  Airway Management Planned: Natural Airway and Nasal Cannula  Additional Equipment: None  Intra-op Plan:   Post-operative Plan:   Informed Consent: I have reviewed the patients History and Physical, chart, labs and discussed the procedure including the risks, benefits and alternatives for the proposed anesthesia with the patient or authorized representative who has indicated his/her understanding and acceptance.     Dental advisory given and Consent reviewed with POA  Plan Discussed with: CRNA and Surgeon  Anesthesia Plan Comments: (Consent from pt's guardian, by telephone)       Anesthesia Quick Evaluation

## 2022-08-21 NOTE — Op Note (Signed)
[facility] Patient Name: Ronald Roman Procedure Date : 08/21/2022 MRN: [id number] Attending MD: Jerene Bears , MD, [id number] Date of Birth: August 27, 1960 CSN: [id number] Age: 62 Admit Type: Inpatient Procedure:                Upper GI endoscopy Indications:              Coffee-ground emesis Providers:                Lajuan Lines. Hilarie Fredrickson, MD, Carlyn Reichert, RN, William Dalton, Technician Referring MD:             [facility] Medicines:                Monitored Anesthesia Care Complications:            No immediate complications. Estimated Blood Loss:     Estimated blood loss: none. Procedure:                Pre-Anesthesia Assessment:                           - Prior to the procedure, a History and Physical                            was performed, and patient medications and                            allergies were reviewed. The patient's tolerance of                            previous anesthesia was also reviewed. The risks                            and benefits of the procedure and the sedation                            options and risks were discussed with the patient.                            All questions were answered, and informed consent                            was obtained. Prior Anticoagulants: The patient has                            taken no anticoagulant or antiplatelet agents. ASA                            Grade Assessment: III - A patient with severe                            systemic disease. After reviewing the risks and  benefits, the patient was deemed in satisfactory                            condition to undergo the procedure.                           After obtaining informed consent, the endoscope was                            passed under direct vision. Throughout the                            procedure, the patient's blood pressure, pulse, and                             oxygen saturations were monitored continuously. The                            [id number] Olympus endoscope was introduced                            through the mouth, and advanced to the third part                            of duodenum. The upper GI endoscopy was                            accomplished without difficulty. The patient                            tolerated the procedure well. Scope In: Scope Out: Findings:      LA Grade A (one or more mucosal breaks less than 5 mm, not extending       between tops of 2 mucosal folds) esophagitis with no bleeding was found       at the gastroesophageal junction.      The exam of the esophagus was otherwise normal.      The entire examined stomach was normal.      The examined duodenum was normal. Impression:               - LA Grade A (very mild and healing) reflux                            esophagitis with no bleeding.                           - Normal stomach.                           - Normal examined duodenum.                           - No specimens collected. Moderate Sedation:      N/A Recommendation:           - Patient has a contact number available for  emergencies. The signs and symptoms of potential                            delayed complications were discussed with the                            patient. Return to normal activities tomorrow.                            Written discharge instructions were provided to the                            patient.                           - Resume previous diet.                           - Continue present medications. Once daily PPI x 6                            weeks reasonable. Would reserve chronic PPI for                            chronic GERD or dyspeptic symptoms.                           - GI will sign off, call if questions. Procedure Code(s):        --- Professional ---                           [phone], Esophagogastroduodenoscopy,  flexible,                            transoral; diagnostic, including collection of                            specimen(s) by brushing or washing, when performed                            (separate procedure) Diagnosis Code(s):        --- Professional ---                           K21.00, Gastro-esophageal reflux disease with                            esophagitis, without bleeding                           K92.0, Hematemesis CPT copyright 2022 [employer]. All rights reserved. The codes documented in this report are preliminary and upon coder review may  be revised to meet current compliance requirements. Jerene Bears, MD 08/21/2022 2:48:39 PM This report has been signed electronically. Number of Addenda: 0

## 2022-08-21 NOTE — Anesthesia Postprocedure Evaluation (Signed)
Anesthesia Post Note  Patient: Ronald Roman  Procedure(s) Performed: ESOPHAGOGASTRODUODENOSCOPY (EGD) WITH PROPOFOL     Patient location during evaluation: PACU Anesthesia Type: MAC Level of consciousness: awake and alert and patient cooperative Pain management: pain level controlled Vital Signs Assessment: post-procedure vital signs reviewed and stable Respiratory status: spontaneous breathing, nonlabored ventilation and respiratory function stable Cardiovascular status: blood pressure returned to baseline and stable Postop Assessment: no apparent nausea or vomiting Anesthetic complications: no   No notable events documented.  Last Vitals:  Vitals:   08/21/22 1451 08/21/22 1500  BP: (!) 126/92 (!) 153/98  Pulse: (!) 112 (!) 129  Resp: (!) 26 (!) 23  Temp:    SpO2: 100% 92%    Last Pain:  Vitals:   08/21/22 1356  TempSrc: Temporal  PainSc: 0-No pain                 Day Deery,E. Harneet Noblett

## 2022-08-21 NOTE — Interval H&P Note (Signed)
History and Physical Interval Note: For EGD today to evaluate coffee-ground emesis Phone consent obtained from the patient's legal guardian [facility].  Witnessed by Nicoletta Ba, PA-C.  The nature of the procedure, as well as the risks, benefits, and alternatives were carefully and thoroughly reviewed with the patient's legal guardian by phone.  Ample time for discussion and questions allowed. The patient's legal guardian understood, was satisfied, and agreed to proceed.      Latest Ref Rng & Units 08/20/2022    6:38 AM 08/19/2022    7:33 AM 08/18/2022    4:08 AM  CBC  WBC 4.0 - 10.5 K/uL 7.8  7.9  9.2   Hemoglobin 13.0 - 17.0 g/dL 9.1  8.7  8.5   Hematocrit 39.0 - 52.0 % 28.3  26.5  26.0   Platelets 150 - 400 K/uL 196  180  165       08/21/2022 1:50 PM  Mauri Brooklyn  has presented today for surgery, with the diagnosis of Coffee-ground emesis.  The various methods of treatment have been discussed with the patient and family. After consideration of risks, benefits and other options for treatment, the patient has consented to  Procedure(s): ESOPHAGOGASTRODUODENOSCOPY (EGD) WITH PROPOFOL (N/A) as a surgical intervention.  The patient's history has been reviewed, patient examined, no change in status, stable for surgery.  I have reviewed the patient's chart and labs.  Questions were answered to the patient's satisfaction.     Lajuan Lines Alt[place]ise Wahler

## 2022-08-22 DIAGNOSIS — K5900 Constipation, unspecified: Secondary | ICD-10-CM | POA: Diagnosis not present

## 2022-08-22 DIAGNOSIS — F209 Schizophrenia, unspecified: Secondary | ICD-10-CM | POA: Diagnosis not present

## 2022-08-22 DIAGNOSIS — K920 Hematemesis: Secondary | ICD-10-CM | POA: Diagnosis not present

## 2022-08-22 DIAGNOSIS — E875 Hyperkalemia: Secondary | ICD-10-CM | POA: Diagnosis not present

## 2022-08-22 LAB — BASIC METABOLIC PANEL
Anion gap: 13 (ref 5–15)
BUN: 11 mg/dL (ref 8–23)
CO2: 21 mmol/L — ABNORMAL LOW (ref 22–32)
Calcium: 9.3 mg/dL (ref 8.9–10.3)
Chloride: 105 mmol/L (ref 98–111)
Creatinine, Ser: 0.9 mg/dL (ref 0.61–1.24)
GFR, Estimated: >60 mL/min (ref >60)
Glucose, Bld: 114 mg/dL — ABNORMAL HIGH (ref 70–99)
Potassium: 4.1 mmol/L (ref 3.5–5.1)
Sodium: 139 mmol/L (ref 135–145)

## 2022-08-22 LAB — BRAIN NATRIURETIC PEPTIDE: B Natriuretic Peptide: 94 pg/mL (ref 0.0–100.0)

## 2022-08-22 MED ORDER — MIRTAZAPINE 15 MG PO TBDP
15.0000 mg | ORAL_TABLET | Freq: Every day | ORAL | Status: DC
  Administered 2022-08-22: 15 mg via ORAL
  Filled 2022-08-22 (×2): qty 1

## 2022-08-22 MED ORDER — CLOZAPINE 25 MG PO TABS
12.5000 mg | ORAL_TABLET | Freq: Two times a day (BID) | ORAL | Status: DC
  Filled 2022-08-22 (×2): qty 1

## 2022-08-22 MED ORDER — LISINOPRIL 10 MG PO TABS
10.0000 mg | ORAL_TABLET | Freq: Every day | ORAL | Status: DC
  Administered 2022-08-22 – 2022-08-23 (×2): 10 mg via ORAL
  Filled 2022-08-22 (×2): qty 1

## 2022-08-22 MED ORDER — QUETIAPINE FUMARATE 50 MG PO TABS
50.0000 mg | ORAL_TABLET | Freq: Two times a day (BID) | ORAL | Status: DC
  Administered 2022-08-22 – 2022-08-23 (×2): 50 mg via ORAL
  Filled 2022-08-22 (×3): qty 1

## 2022-08-22 MED ORDER — AMANTADINE HCL 100 MG PO CAPS
100.0000 mg | ORAL_CAPSULE | Freq: Two times a day (BID) | ORAL | Status: DC
  Administered 2022-08-22 – 2022-08-23 (×3): 100 mg via ORAL
  Filled 2022-08-22 (×3): qty 1

## 2022-08-22 MED ORDER — METOPROLOL TARTRATE 5 MG/5ML IV SOLN
5.0000 mg | Freq: Four times a day (QID) | INTRAVENOUS | Status: DC | PRN
  Administered 2022-08-22: 5 mg via INTRAVENOUS
  Filled 2022-08-22: qty 5

## 2022-08-22 MED ORDER — BENZTROPINE MESYLATE 1 MG PO TABS
1.0000 mg | ORAL_TABLET | Freq: Two times a day (BID) | ORAL | Status: DC
  Administered 2022-08-22 – 2022-08-23 (×3): 1 mg via ORAL
  Filled 2022-08-22 (×4): qty 1

## 2022-08-22 MED ORDER — CLOZAPINE 25 MG PO TABS
12.5000 mg | ORAL_TABLET | Freq: Two times a day (BID) | ORAL | Status: DC
  Administered 2022-08-22 – 2022-08-23 (×2): 12.5 mg via ORAL
  Filled 2022-08-22 (×3): qty 1

## 2022-08-22 MED ORDER — QUETIAPINE FUMARATE 200 MG PO TABS
200.0000 mg | ORAL_TABLET | Freq: Every day | ORAL | Status: DC
  Administered 2022-08-22: 200 mg via ORAL
  Filled 2022-08-22: qty 1

## 2022-08-22 NOTE — Progress Notes (Signed)
[facility] Progress Note Patient: Ronald Roman [id number] DOB: 21-Jul-1960 DOA: 08/15/2022  DOS: the patient was seen and examined on 08/22/2022  Brief hospital course: Patient is a 62 year old male with with history of diabetes mellitus, hypertension and schizophrenia who is a ward of the state who presented to the emergency department on 3/24 from his long-term care facility with abdominal distention and coffee-ground emesis.  At time of admission, patient noted to be hypotensive with a potassium of 7.4 and acute kidney injury.  Abdominal CT revealed prominent large/small bowel with stool concerning for ileus and patchy infiltrate at lung bases concerning for aspiration pneumonitis.  Initially patient was admitted to the ICU to the critical care service.  Patient was treated with IV fluids by following day, better stabilized and transferred over to the hospitalist service on 3/25.  Constipation was treated with enema and patient initially started on clear liquid diet able to be advanced.  Despite x-rays noting persistent ileus, patient has had a number of bowel movements and tolerating full liquids.  GI plans to take patient for EGD on 3/30.  Assessment and Plan: Hypotension from hypovolemia-resolved. Resolved with IV fluids.  Ileus vs partial bowel obstruction- Resolved Secondary to severe constipation.  Appreciate GI help.   status post unremarkable EGD.  Severe constipation. Treated with enema.  Improving.  Used suppository.  Plan is for more smog enema, Dulcolax and MiraLAX.  Patient had several large bowel movements   Following hospitalization  Hypomagnesemia/hypokalemia Replaced as needed.  Keep potassium above 4  AKI-resolved Treated with IV fluid  Aspiration pneumonitis. Currently no evidence of bacterial infection. Will monitor closely.  Type 2 diabetes mellitus, uncontrolled with hyperglycemia without long-term insulin use. Sliding scale  HTN. Blood pressure  was soft therefore currently blood pressure medications on hold. Monitor.  History of schizophrenia. From group home, ward of state, have resumed home medications.   History of seizures. On IV Keppra for now. Holding home regimen for now.  Hyperammonemia.  Likely lab error  Lactic acidosis-resolved. Secondary to intravascular volume depletion.  Not felt to be sepsis.  Sepsis ruled out.   Subjective:  patient with no complaints  Physical Exam: General: Oriented x 1-2, no acute distress, Vascular: Regular rate and rhythm, S1-S2 Lungs: Clear to auscultation bilaterally Abdomen: Soft, decreased distention,  nontender, positive bowel sounds Extremities: No clubbing or cyanosis or edema  Data Reviewed:  normal labs  Disposition: Status is: Inpatient Remains inpatient appropriate because:  returned back to facility on 4/1  Place and maintain sequential compression device Start: 08/15/22 1043   Family Communication: Ward of state Level of care: Progressive   Vitals:   08/21/22 2313 08/22/22 0437 08/22/22 1000 08/22/22 1154  BP: (!) 141/94 (!) 165/97 (!) 146/89 (!) 173/96  Pulse: 83 86  93  Resp: 17 17 16 18   Temp: 99.5 F (37.5 C) 98.4 F (36.9 C) 98.5 F (36.9 C) 98.4 F (36.9 C)  TempSrc: Oral Oral Oral Oral  SpO2: 100% 98% 93% 100%  Weight:      Height:         Author: Annita Brod, MD 08/22/2022 2:02 PM  Please look on [email or web address] to find out who is on call.

## 2022-08-22 NOTE — Progress Notes (Signed)
PHARMACIST - PHYSICIAN ORDER COMMUNICATION  Ronald Roman is a 62 y.o. year old male with a history of schizophrenia on Clozapine PTA. Continuing this medication order as an inpatient requires that monitoring parameters per REMS requirements must be met. Clozapine REMS Dispense Authorization was obtained, and will dispense inpatient.  RDA code [id number].  Verified Clozapine dose: 50 mg PO twice daily  Of note, last PTA dose was given on 3/23. Discussed with MD (Dr. Maryland Pink) and considering that it has been more than 2 days since the last dose, will re-initiate with 12.5 mg twice daily. Last ANC value and date reported on the Clozapine REMS website: [place] (per ?L)  [place] monitoring frequency: Monthly  Marcene Corning 08/22/2022, 2:24 PM

## 2022-08-23 ENCOUNTER — Encounter (HOSPITAL_COMMUNITY): Payer: Self-pay | Admitting: Internal Medicine

## 2022-08-23 DIAGNOSIS — F200 Paranoid schizophrenia: Secondary | ICD-10-CM

## 2022-08-23 DIAGNOSIS — K5900 Constipation, unspecified: Secondary | ICD-10-CM | POA: Diagnosis not present

## 2022-08-23 DIAGNOSIS — K920 Hematemesis: Secondary | ICD-10-CM | POA: Diagnosis not present

## 2022-08-23 LAB — BASIC METABOLIC PANEL
Anion gap: 10 (ref 5–15)
BUN: 14 mg/dL (ref 8–23)
CO2: 24 mmol/L (ref 22–32)
Calcium: 9.1 mg/dL (ref 8.9–10.3)
Chloride: 105 mmol/L (ref 98–111)
Creatinine, Ser: 0.91 mg/dL (ref 0.61–1.24)
GFR, Estimated: >60 mL/min (ref >60)
Glucose, Bld: 106 mg/dL — ABNORMAL HIGH (ref 70–99)
Potassium: 3.9 mmol/L (ref 3.5–5.1)
Sodium: 139 mmol/L (ref 135–145)

## 2022-08-23 MED ORDER — CLOZAPINE 25 MG PO TABS: ORAL_TABLET | ORAL | 0 refills | Status: DC

## 2022-08-23 MED ORDER — TRAZODONE HCL 100 MG PO TABS: 100.0000 mg | ORAL_TABLET | Freq: Every day | ORAL | 1 refills | Status: DC

## 2022-08-23 MED ORDER — QUETIAPINE FUMARATE 50 MG PO TABS: 50.0000 mg | ORAL_TABLET | ORAL | Status: DC

## 2022-08-23 MED ORDER — ZOLPIDEM TARTRATE 5 MG PO TABS: 5.0000 mg | ORAL_TABLET | Freq: Every evening | ORAL | 0 refills | Status: DC | PRN

## 2022-08-23 MED ORDER — QUETIAPINE FUMARATE 200 MG PO TABS: 200.0000 mg | ORAL_TABLET | Freq: Every day | ORAL | Status: DC

## 2022-08-23 MED ORDER — POLYETHYLENE GLYCOL 3350 17 G PO PACK: 17.0000 g | PACK | Freq: Two times a day (BID) | ORAL | 0 refills | Status: DC

## 2022-08-23 NOTE — Discharge Summary (Signed)
Physician Discharge Summary   Patient: Ronald Roman MRN: [id number] DOB: Aug 08, 1960  Admit date:     08/15/2022  Discharge date: 08/23/22  Discharge Physician: Annita Brod   PCP: [facility]   Recommendations at discharge:   Medication change: Patient previously on Clazuril 50 mg p.o. twice daily.  As the patient had been off of this medication for some time, it was restarted on 3/31 at 12.5 p.o. twice daily.  Patient will slowly taper back up to his previous dose staying at 12.5 p.o. twice daily x 3 days, then increase to 25 p.o. twice daily x 3 days then increase back up to baseline dose of 50 mg p.o. twice daily Patient returning back to his facility New medication: MiraLAX 17 g p.o. twice daily.  Hold for excess diarrhea Medication clarification: Patient will continue on his traditional dose of Seroquel which was 50 mg in the morning, 50 mg at 3 PM and 200 mg at night.  Discharge Diagnoses: Principal Problem:   Coffee ground emesis Active Problems:   Abnormal CT scan, gastrointestinal tract   Constipation   Gastroesophageal reflux disease with esophagitis without hemorrhage  Resolved Problems:   * No resolved hospital problems. [facility] Course: Patient is a 62 year old male with with history of diabetes mellitus, hypertension and schizophrenia who is a ward of the state who presented to the emergency department on 3/24 from his long-term care facility with abdominal distention and coffee-ground emesis.  At time of admission, patient noted to be hypotensive with a potassium of 7.4 and acute kidney injury.  Abdominal CT revealed prominent large/small bowel with stool concerning for ileus and patchy infiltrate at lung bases concerning for aspiration pneumonitis.  Initially patient was admitted to the ICU to the critical care service.  Patient was treated with IV fluids by following day, better stabilized and transferred over to the hospitalist service on 3/25.   Constipation was treated with enema and patient initially started on clear liquid diet able to be advanced.  Despite x-rays noting persistent ileus, patient has had a number of bowel movements and tolerating full liquids.  Patient underwent endoscopy on 3/31 by GI, noting grade 1 esophagitis, but otherwise unremarkable findings.  Diet advanced and patient tolerating.  Assessment and Plan: Hypotension from hypovolemia-resolved. Resolved with IV fluids.   Ileus vs partial bowel obstruction- Resolved Secondary to severe constipation.  Appreciate GI help.   status post unremarkable EGD.   Severe constipation. Treated with enema.  Improving.  Used suppository.  Plan is for more smog enema, Dulcolax and MiraLAX.  Patient had several large bowel movements   Following hospitalization   Hypomagnesemia/hypokalemia Replaced as needed.  Keep potassium above 4   AKI-resolved Treated with IV fluid   Aspiration pneumonitis. Currently no evidence of bacterial infection. Will monitor closely.   Type 2 diabetes mellitus, uncontrolled with hyperglycemia without long-term insulin use. Sliding scale   HTN. Blood pressure was soft therefore currently blood pressure medications on hold. Monitor.   History of schizophrenia. From group home, ward of state, have resumed home medications.    History of seizures. Had been on IV Keppra while NPO.  Resumed on Keppra p.o. and Depakote on discharge   Hyperammonemia.  Likely lab error   Lactic acidosis-resolved. Secondary to intravascular volume depletion.  Not felt to be sepsis.  Sepsis ruled out.       Pain control - [employer] Controlled Substance Reporting System database was reviewed. and patient was instructed, not  to drive, operate heavy machinery, perform activities at heights, swimming or participation in water activities or provide baby-sitting services while on Pain, Sleep and Anxiety Medications; until their outpatient Physician has  advised to do so again. Also recommended to not to take more than prescribed Pain, Sleep and Anxiety Medications.  Consultants: Gastroenterology, neurology Procedures performed: Endoscopy done 3/30 Disposition: Skilled nursing facility Diet recommendation:  Cardiac diet DISCHARGE MEDICATION: Allergies as of 08/23/2022       Reactions   Cogentin [benztropine] Other (See Comments)   Per MAR    Penicillins Other (See Comments)   Per MAR - unable to verify patients PCN reaction    Prolixin [fluphenazine] Other (See Comments)   Per MAR         Medication List     TAKE these medications    amantadine 100 MG capsule Commonly known as: SYMMETREL Take 100 mg by mouth 2 (two) times daily.   benztropine 1 MG tablet Commonly known as: COGENTIN Take 1 mg by mouth 2 (two) times daily.   cetirizine 10 MG tablet Commonly known as: ZYRTEC Take 10 mg by mouth daily.   cloZAPine 25 MG tablet Commonly known as: CLOZARIL Take 0.5 tablets (12.5 mg total) by mouth 2 (two) times daily for 3 days, THEN 1 tablet (25 mg total) 2 (two) times daily for 3 days, THEN 2 tablets (50 mg total) 2 (two) times daily. Start taking on: August 23, 2022 What changed:  medication strength See the new instructions.   dapagliflozin propanediol 10 MG Tabs tablet Commonly known as: FARXIGA Take 10 mg by mouth daily after breakfast.   divalproex 500 MG 24 hr tablet Commonly known as: DEPAKOTE ER Take 500 mg by mouth 2 (two) times daily.   hydrOXYzine 25 MG tablet Commonly known as: ATARAX Take 25 mg by mouth 2 (two) times daily as needed for anxiety.   levETIRAcetam 500 MG tablet Commonly known as: KEPPRA Take 500 mg by mouth 2 (two) times daily.   lisinopril 10 MG tablet Commonly known as: ZESTRIL Take 10 mg by mouth daily.   magnesium oxide 400 (240 Mg) MG tablet Commonly known as: MAG-OX Take 1 tablet (400 mg total) by mouth daily.   metFORMIN 1000 MG tablet Commonly known as: GLUCOPHAGE Take  1,000 mg by mouth 2 (two) times daily with a meal.   mirtazapine 15 MG disintegrating tablet Commonly known as: REMERON SOL-TAB Take 15 mg by mouth at bedtime.   polyethylene glycol 17 g packet Commonly known as: MIRALAX / GLYCOLAX Take 17 g by mouth 2 (two) times daily. Hold for excess diarrhea   QUEtiapine 200 MG tablet Commonly known as: SEROQUEL Take 1 tablet (200 mg total) by mouth at bedtime.   QUEtiapine 50 MG tablet Commonly known as: SEROQUEL Take 1 tablet (50 mg total) by mouth See admin instructions. Take 50 mg (1 tablet) by mouth every morning and at 3 pm.   traZODone 100 MG tablet Commonly known as: DESYREL Take 1 tablet (100 mg total) by mouth at bedtime.   zolpidem 5 MG tablet Commonly known as: AMBIEN Take 1 tablet (5 mg total) by mouth at bedtime as needed for sleep.        Discharge Exam: Filed Weights   08/15/22 0631 08/21/22 1356  Weight: 70 kg 70 kg   General: Alert and oriented x 1-2, no acute distress Vascular: Regular rate and rhythm, S1-S2  Condition at discharge: good  The results of significant diagnostics from this hospitalization (  including imaging, microbiology, ancillary and laboratory) are listed below for reference.   Imaging Studies: DG Abd Portable 1V  Result Date: 08/20/2022 CLINICAL DATA:  Ileus. EXAM: PORTABLE ABDOMEN - 1 VIEW COMPARISON:  08/19/2022. FINDINGS: 0925 hours. Unchanged diffuse gaseous distention of large and small bowel. No significant bony abnormality. IMPRESSION: Unchanged ileus. Electronically Signed   By: Emmit Alexanders M.D.   On: 08/20/2022 09:34   DG Abd Portable 1V  Result Date: 08/19/2022 CLINICAL DATA:  Ileus, GI bleed EXAM: PORTABLE ABDOMEN - 1 VIEW COMPARISON:  Portable exam 0944 hours compared to 08/17/2022 FINDINGS: Gaseous distention of large and small bowel loops throughout abdomen most consistent with ileus. No bowel wall thickening. Osseous structures unremarkable. IMPRESSION: Persistent ileus.  Electronically Signed   By: Lavonia Dana M.D.   On: 08/19/2022 10:20   DG Abd Portable 1V  Result Date: 08/17/2022 CLINICAL DATA:  Abdominal distention, bowel obstruction EXAM: PORTABLE ABDOMEN - 1 VIEW COMPARISON:  Previous studies including the examination of 08/16/2022 FINDINGS: There is interval clearing of gastric distention. There is dilation of small-bowel loops measuring up to 4.5 cm in diameter. Gas is present in ascending and transverse colon. There is no distention of the rectosigmoid. IMPRESSION: Dilation of small-bowel loops may suggest ileus or partial obstruction. There is interval decrease in gastric distention. Electronically Signed   By: Elmer Picker M.D.   On: 08/17/2022 13:27   DG CHEST PORT 1 VIEW  Result Date: 08/16/2022 CLINICAL DATA:  SOB (shortness of breath). Abdominal distension. Ileus ([place]). EXAM: PORTABLE CHEST 1 VIEW COMPARISON:  Chest radiograph March 24, 24. FINDINGS: Mild streaky bibasilar opacities. No confluent consolidation. No visible pleural effusions or pneumothorax. Cardiomediastinal silhouette is unchanged. IMPRESSION: Mild streaky bibasilar opacities, which could represent atelectasis, aspiration and/or pneumonia. Electronically Signed   By: Margaretha Sheffield M.D.   On: 08/16/2022 14:48   DG Abd Portable 1V  Result Date: 08/16/2022 CLINICAL DATA:  Abdominal distention EXAM: PORTABLE ABDOMEN - 1 VIEW COMPARISON:  Previous studies including the examination done earlier today FINDINGS: There is moderate to marked gaseous distention of stomach with interval worsening. There are gas-filled small bowel loops. Small bowel loops measure up to 4.8 cm in diameter. Gas and stool are present in colon. IMPRESSION: There is gastric distention. There is dilation of small bowel loops filled with gas. Gas and stool are present in the colon. These findings appear more prominent, possibly suggesting ileus or partial obstruction. Electronically Signed   By: Elmer Picker  M.D.   On: 08/16/2022 14:47   DG Abd Portable 1V  Result Date: 08/16/2022 CLINICAL DATA:  [id number] with ileus. EXAM: PORTABLE ABDOMEN - 1 VIEW COMPARISON:  Similar exam yesterday at 7:44 a.m. FINDINGS: Moderate retained fecal burden is again noted. There is increased small bowel dilatation in the mid to lower abdomen up to 4 cm, previously 3.4 cm. There is no supine evidence of free air. No radiopaque urinary stone is evident. There is a right femoral central line terminating in the right common iliac vein. IMPRESSION: 1. Increased small bowel dilatation in the mid to lower abdomen up to 4 cm, previously 3.4 cm. Moderate retained fecal burden. 2. Right femoral central line. Electronically Signed   By: Telford Nab M.D.   On: 08/16/2022 05:43   CT Head Wo Contrast  Result Date: 08/15/2022 CLINICAL DATA:  Altered mental status EXAM: CT HEAD WITHOUT CONTRAST TECHNIQUE: Contiguous axial images were obtained from the base of the skull through the vertex without intravenous contrast.  RADIATION DOSE REDUCTION: This exam was performed according to the departmental dose-optimization program which includes automated exposure control, adjustment of the mA and/or kV according to patient size and/or use of iterative reconstruction technique. COMPARISON:  05/29/2022 FINDINGS: Brain: The brainstem, cerebellum, cerebral peduncles, thalami, basal ganglia, basilar cisterns, and ventricular system appear within normal limits. Periventricular white matter and corona radiata hypodensities favor chronic ischemic microvascular white matter disease. No intracranial hemorrhage, mass lesion, or acute CVA. Vascular: There is atherosclerotic calcification of the cavernous carotid arteries bilaterally. Skull: Unremarkable Sinuses/Orbits: Unremarkable Other: Cutaneous lesions along the scalp at the vertex appear chronically stable. IMPRESSION: 1. No acute intracranial findings. 2. Periventricular white matter and corona radiata  hypodensities favor chronic ischemic microvascular white matter disease. 3. Atherosclerosis. 4. Chronically stable scalp lesions along the vertex, query scarring. Electronically Signed   By: Van Clines M.D.   On: 08/15/2022 09:33   CT ABDOMEN PELVIS WO CONTRAST  Result Date: 08/15/2022 CLINICAL DATA:  Abdominal pain EXAM: CT ABDOMEN AND PELVIS WITHOUT CONTRAST TECHNIQUE: Multidetector CT imaging of the abdomen and pelvis was performed following the standard protocol without IV contrast. RADIATION DOSE REDUCTION: This exam was performed according to the departmental dose-optimization program which includes automated exposure control, adjustment of the mA and/or kV according to patient size and/or use of iterative reconstruction technique. COMPARISON:  Radiographs 08/15/2022 and report from CT scan 06/19/2005 FINDINGS: Lower chest: Patchy and clustered airspace opacities with slight nodularity observed inferiorly in the right upper lobe on images 1 through 7 of series 6, favoring pneumonia, cannot exclude atypical pneumonia. Airway thickening is present, suggesting bronchitis or reactive airways disease. Mild atelectasis or scarring in the left lower lobe and lingula. Left anterior descending and right coronary artery atherosclerotic calcification. Low-density blood pool suggests anemia. Hepatobiliary: Punctate calcifications in the liver favoring old granulomatous disease. Contracted gallbladder. Pancreas: Unremarkable Spleen: Unremarkable Adrenals/Urinary Tract: Both adrenal glands appear normal. No hydronephrosis or hydroureter. Urinary bladder normal. 5 mm hyperdense lesion in the left mid kidney on image 55 series 7, internal density 97 Hounsfield units, compatible with a small benign Bosniak category 2 cyst. No further imaging workup of this lesion is indicated. Motion blurred 1.1 cm hypodense lesion of the left mid kidney, 0 Hounsfield units, most compatible with a benign Bosniak category 1 cyst.  Similar cyst reported on ultrasound of 10/11/2020. No further imaging workup of this lesion is indicated. Stomach/Bowel: Substantial prominence of stool throughout the colon and in the rectum. Normal appendix. Generalized distention of small bowel with some loops less affected but without an obvious lead point for obstruction., individual loops up to about 4 cm in diameter. Borderline distention of the stomach. Vascular/Lymphatic: Mild aortoiliac atherosclerotic vascular calcification. A right femoral venous line terminates in the common femoral vein. Reproductive: Unremarkable Other: No supplemental non-categorized findings. Musculoskeletal: Lower lumbar spondylosis and degenerative disc disease with degenerative endplate sclerosis at the L5-S1 level and grade 1 degenerative anterolisthesis at L4-5. Moderate axial loss of articular space in both hips. IMPRESSION: 1. Patchy and clustered airspace opacities with slight nodularity inferiorly in the right upper lobe, favoring pneumonia, cannot exclude atypical pneumonia. 2. Prominent large and small bowel diffusely, with formed stool in the prominent:, and no obvious lead point for obstruction. This raises suspicion for diffuse bowel ileus, correlate with bowel sounds and clinical presentation. 3. Low-density blood pool suggests anemia. 4. Coronary atherosclerosis.  Aortic Atherosclerosis (ICD10-I70.0). 5. Lower lumbar spondylosis and degenerative disc disease. 6. Moderate axial loss of articular space in both  hips. 7. Right femoral venous line terminates in the common femoral vein. 8. Left renal lesions compatible with benign cysts warranting no further imaging workup. Electronically Signed   By: Van Clines M.D.   On: 08/15/2022 09:29   DG Abd 2 Views  Result Date: 08/15/2022 CLINICAL DATA:  Vomiting. EXAM: ABDOMEN - 2 VIEW COMPARISON:  Same day. FINDINGS: No abnormal bowel dilatation is noted. Moderate amount of stool seen throughout the colon. There is  no evidence of free air. No radio-opaque calculi or other significant radiographic abnormality is seen. IMPRESSION: Moderate stool burden.  No abnormal bowel dilatation. Electronically Signed   By: Marijo Conception M.D.   On: 08/15/2022 08:04   DG Abdomen 1 View  Result Date: 08/15/2022 CLINICAL DATA:  62 year old male with GI bleed. Questionable trace pneumoperitoneum on portable chest x-ray. EXAM: ABDOMEN - 1 VIEW COMPARISON:  Portable chest 0638 hours today. FINDINGS: This one view exam was already completed at the time that I discussed the question of trace pneumoperitoneum on portable chest x-ray with Dr. Ralene Bathe. Portable AP supine view at 0659 hours. Diffuse gas-filled bowel loops, including moderate gas distention of the stomach and mid abdominal small bowel. Gas and retained stool throughout the visible colon. It remains difficult to exclude pneumoperitoneum. But there is no differential dilatation of loops to strongly suggest mechanical obstruction. No acute osseous abnormality identified. IMPRESSION: 1. This one-view supine portable abdomen was already completed when I discussed the question of trace portable chest x-ray pneumoperitoneum with Dr. Ayesha Rumpf. As on that exam, a small volume of pneumoperitoneum cannot be excluded. 2. Diffuse gas distended stomach, small bowel, and gas plus extensive retained stool in the colon. The pattern favors ileus over a mechanical bowel obstruction. Electronically Signed   By: Genevie Ann M.D.   On: 08/15/2022 07:27   DG Chest Port 1 View  Addendum Date: 08/15/2022   ADDENDUM REPORT: 08/15/2022 07:22 ADDENDUM: Study discussed by telephone with Dr. Quintella Reichert on 08/15/2022 at 0707 hours. Follow-up abdomen radiographs pending. Electronically Signed   By: Genevie Ann M.D.   On: 08/15/2022 07:22   Result Date: 08/15/2022 CLINICAL DATA:  62 year old male with altered mental status. GI bleed. EXAM: PORTABLE CHEST 1 VIEW COMPARISON:  Chest radiographs 07/08/2022 and earlier.  FINDINGS: Portable AP semi upright view at 0638 hours. At the medial left cardiophrenic angle there is trace gas lucency which is nonspecific, but raises the possibility of trace pneumoperitoneum. Visible bowel gas pattern with mildly dilated 4 cm epigastric small bowel loop. Visible retained stool also at the colonic flexures. Mildly low lung volumes. Normal cardiac size and mediastinal contours. Visualized tracheal air column is within normal limits. Allowing for portable technique the lungs are clear. No pneumothorax or pleural effusion. No acute osseous abnormality identified. IMPRESSION: 1. Questionable trace pneumoperitoneum near the left cardiophrenic angle. Mildly dilated gas-filled small bowel loops in the visible upper abdomen. 2. Low lung volumes with no acute cardiopulmonary abnormality. Electronically Signed: By: Genevie Ann M.D. On: 08/15/2022 07:06    Microbiology: Results for orders placed or performed during the hospital encounter of 08/15/22  Blood culture (routine x 2)     Status: None   Collection Time: 08/15/22  7:37 AM   Specimen: BLOOD RIGHT HAND  Result Value Ref Range Status   Specimen Description BLOOD RIGHT HAND  Final   Special Requests   Final    BOTTLES DRAWN AEROBIC AND ANAEROBIC Blood Culture adequate volume   Culture   Final  NO GROWTH 5 DAYS Performed at [facility], [place] [address], [place], [place] [address]    Report Status 08/20/2022 FINAL  Final  Blood culture (routine x 2)     Status: Abnormal   Collection Time: 08/15/22  7:37 AM   Specimen: BLOOD RIGHT ARM  Result Value Ref Range Status   Specimen Description BLOOD RIGHT ARM  Final   Special Requests   Final    BOTTLES DRAWN AEROBIC AND ANAEROBIC Blood Culture results may not be optimal due to an excessive volume of blood received in culture bottles   Culture  Setup Time   Final    GRAM POSITIVE COCCI IN BOTH AEROBIC AND ANAEROBIC BOTTLES CRITICAL RESULT CALLED TO, READ BACK BY AND VERIFIED  WITH: PHARMD J. LEDFORD 08/16/22 @ 0334 BY AB    Culture (A)  Final    STAPHYLOCOCCUS HOMINIS THE SIGNIFICANCE OF ISOLATING THIS ORGANISM FROM A SINGLE VENIPUNCTURE CANNOT BE PREDICTED WITHOUT FURTHER CLINICAL AND CULTURE CORRELATION. SUSCEPTIBILITIES AVAILABLE ONLY ON REQUEST. Performed at [facility], [place] [address], [place], [place] [address]    Report Status 08/18/2022 FINAL  Final  Blood Culture ID Panel (Reflexed)     Status: Abnormal   Collection Time: 08/15/22  7:37 AM  Result Value Ref Range Status   Enterococcus faecalis NOT DETECTED NOT DETECTED Final   Enterococcus Faecium NOT DETECTED NOT DETECTED Final   Listeria monocytogenes NOT DETECTED NOT DETECTED Final   Staphylococcus species DETECTED (A) NOT DETECTED Final    Comment: CRITICAL RESULT CALLED TO, READ BACK BY AND VERIFIED WITH: PHARMD J. LEDFORD 08/16/22 @ 0334 BY AB    Staphylococcus aureus (BCID) NOT DETECTED NOT DETECTED Final   Staphylococcus epidermidis NOT DETECTED NOT DETECTED Final   Staphylococcus lugdunensis NOT DETECTED NOT DETECTED Final   Streptococcus species NOT DETECTED NOT DETECTED Final   Streptococcus agalactiae NOT DETECTED NOT DETECTED Final   Streptococcus pneumoniae NOT DETECTED NOT DETECTED Final   Streptococcus pyogenes NOT DETECTED NOT DETECTED Final   A.calcoaceticus-baumannii NOT DETECTED NOT DETECTED Final   Bacteroides fragilis NOT DETECTED NOT DETECTED Final   Enterobacterales NOT DETECTED NOT DETECTED Final   Enterobacter cloacae complex NOT DETECTED NOT DETECTED Final   Escherichia coli NOT DETECTED NOT DETECTED Final   Klebsiella aerogenes NOT DETECTED NOT DETECTED Final   Klebsiella oxytoca NOT DETECTED NOT DETECTED Final   Klebsiella pneumoniae NOT DETECTED NOT DETECTED Final   Proteus species NOT DETECTED NOT DETECTED Final   Salmonella species NOT DETECTED NOT DETECTED Final   Serratia marcescens NOT DETECTED NOT DETECTED Final   Haemophilus influenzae NOT DETECTED  NOT DETECTED Final   Neisseria meningitidis NOT DETECTED NOT DETECTED Final   Pseudomonas aeruginosa NOT DETECTED NOT DETECTED Final   Stenotrophomonas maltophilia NOT DETECTED NOT DETECTED Final   Candida albicans NOT DETECTED NOT DETECTED Final   Candida auris NOT DETECTED NOT DETECTED Final   Candida glabrata NOT DETECTED NOT DETECTED Final   Candida krusei NOT DETECTED NOT DETECTED Final   Candida parapsilosis NOT DETECTED NOT DETECTED Final   Candida tropicalis NOT DETECTED NOT DETECTED Final   Cryptococcus neoformans/gattii NOT DETECTED NOT DETECTED Final    Comment: Performed at [facility], [address], [place], [place] [address]  Resp panel by RT-PCR (RSV, Flu A&B, Covid) Anterior Nasal Swab     Status: None   Collection Time: 08/15/22  9:19 AM   Specimen: Anterior Nasal Swab  Result Value Ref Range Status  SARS Coronavirus 2 by RT PCR NEGATIVE NEGATIVE Final   Influenza A by PCR NEGATIVE NEGATIVE Final   Influenza B by PCR NEGATIVE NEGATIVE Final    Comment: (NOTE) The Xpert Xpress SARS-CoV-2/FLU/RSV plus assay is intended as an aid in the diagnosis of influenza from Nasopharyngeal swab specimens and should not be used as a sole basis for treatment. Nasal washings and aspirates are unacceptable for Xpert Xpress SARS-CoV-2/FLU/RSV testing.  Fact Sheet for Patients: EntrepreneurPulse.com.au  Fact Sheet for Healthcare Providers: IncredibleEmployment.be  This test is not yet approved or cleared by the [place] FDA and has been authorized for detection and/or diagnosis of SARS-CoV-2 by FDA under an Emergency Use Authorization (EUA). This EUA will remain in effect (meaning this test can be used) for the duration of the COVID-19 declaration under Section 564(b)(1) of the Act, 21 U.S.C. section 360bbb-3(b)(1), unless the authorization is terminated or revoked.     Resp Syncytial Virus by PCR NEGATIVE NEGATIVE Final     Comment: (NOTE) Fact Sheet for Patients: EntrepreneurPulse.com.au  Fact Sheet for Healthcare Providers: IncredibleEmployment.be  This test is not yet approved or cleared by the [place] FDA and has been authorized for detection and/or diagnosis of SARS-CoV-2 by FDA under an Emergency Use Authorization (EUA). This EUA will remain in effect (meaning this test can be used) for the duration of the COVID-19 declaration under Section 564(b)(1) of the Act, 21 U.S.C. section 360bbb-3(b)(1), unless the authorization is terminated or revoked.  Performed at [facility], [place] [address], [place], [place] [address]     Labs: CBC: Recent Labs  Lab 08/16/22 2155 08/17/22 0720 08/18/22 0408 08/19/22 0733 08/20/22 0638  WBC 8.5 8.8 9.2 7.9 7.8  NEUTROABS  --  6.2 6.0 5.0  --   HGB 9.1* 7.9* 8.5* 8.7* 9.1*  HCT 27.9* 24.3* 26.0* 26.5* 28.3*  MCV 86.4 87.1 87.0 86.9 87.9  PLT 142* 147* 165 180 [id number]   Basic Metabolic Panel: Recent Labs  Lab 08/17/22 0720 08/18/22 0408 08/19/22 0733 08/20/22 0638 08/21/22 1025 08/21/22 1552 08/22/22 0514 08/23/22 0428  NA 140 137 140 141 139  --  139 139  K 4.1 3.9 3.6 3.3* 5.8* 3.8 4.1 3.9  CL 108 104 105 106 109  --  105 105  CO2 21* 23 23 23  20*  --  21* 24  GLUCOSE 92 104* 106* 97 106*  --  114* 106*  BUN 26* 17 13 9 10   --  11 14  CREATININE 1.07 1.05 0.96 0.90 0.86  --  0.90 0.91  CALCIUM 8.6* 9.0 9.1 9.0 8.8*  --  9.3 9.1  MG 1.3* 1.5* 1.5*  --   --   --   --   --    Liver Function Tests: Recent Labs  Lab 08/17/22 0720 08/18/22 0408 08/19/22 0733  AST 21 20 34  ALT 10 12 28   ALKPHOS 52 52 65  BILITOT 0.6 0.7 0.5  PROT 5.3* 5.7* 5.8*  ALBUMIN 2.8* 3.2* 3.2*   CBG: No results for input(s): "GLUCAP" in the last 168 hours.  Discharge time spent: less than 30 minutes.  Signed: Annita Brod, MD [facility] 08/23/2022

## 2022-08-23 NOTE — Plan of Care (Signed)

## 2022-08-23 NOTE — TOC Transition Note (Signed)
Transition of Care [facility]) - CM/SW Discharge Note   Patient Details  Name: Ronald Roman MRN: [id number] Date of Birth: March 12, 1961  [facility] CM/SW Contact:  Geralynn Ochs, LCSW Phone Number: 08/23/2022, 2:53 PM   Clinical Narrative:   CSW notified legal guardian of return to ALF today. CSW sent guardian copy of discharge summary.  CSW contacted [facility] and sent patient's discharge information. CSW confirmed they were ready for patient to return. Transport arranged with [facility] for next available.  Nurse to call report to [phone]    Final next level of care: Assisted Living Barriers to Discharge: Barriers Resolved   Patient Goals and CMS Choice CMS Medicare.gov Compare Post Acute Care list provided to:: Legal Guardian Choice offered to / list presented to : [place] / [place]  Discharge Placement                Patient chooses bed at:  [facility]) Patient to be transferred to facility by: [facility] Name of family member notified: Jewel, guardian Patient and family notified of of transfer: 08/23/22  Discharge Plan and Services Additional resources added to the After Visit Summary for       Post Acute Care Choice: NA                               Social Determinants of Health (SDOH) Interventions SDOH Screenings   Depression (PHQ2-9): Medium Risk (04/29/2021)  Tobacco Use: Medium Risk (08/21/2022)     Readmission Risk Interventions     No data to display

## 2022-08-23 NOTE — NC FL2 (Signed)
[place] LEVEL OF CARE FORM     IDENTIFICATION  Patient Name: Ronald Roman Birthdate: 1961-03-19 Sex: male Admission Date (Current Location): 08/15/2022  [facility] and [place] Number:  Herbalist and Address:  [facility], [place] [address] [place], [place] [address]      Provider Number: [id number]  Attending Physician Name and Address:  Annita Brod, MD  Relative Name and Phone Number:       Current Level of Care: Hospital Recommended Level of Care: Assisted Living Facility Prior Approval Number:    Date Approved/Denied:   PASRR Number:    Discharge Plan: Other (Comment) (ALF)    Current Diagnoses: Patient Active Problem List   Diagnosis Date Noted   Gastroesophageal reflux disease with esophagitis without hemorrhage 08/21/2022   Abnormal CT scan, gastrointestinal tract 08/16/2022   Constipation 08/16/2022   Coffee ground emesis 08/15/2022   Insomnia due to other mental disorder 11/18/2020   Episode of recurrent major depressive disorder [id number]   Acute metabolic encephalopathy [id number]   Impaired cognitive ability    Aggressive behavior    Seizure 05/07/2020   AMS (altered mental status) 02/09/2020   Schizophrenia    Leukocytosis 02/06/2015   Sleep apnea 02/05/2015   AKI (acute kidney injury) 02/04/2015   Seizure disorder 02/04/2015   Acute encephalopathy 02/04/2015   Diabetes mellitus without complication    Hyperlipidemia    Hypertension    Paranoid schizophrenia     Orientation RESPIRATION BLADDER Height & Weight     Self, Place  Normal Incontinent Weight: 154 lb 5.2 oz (70 kg) Height:  5\' 6"  (167.6 cm)  BEHAVIORAL SYMPTOMS/MOOD NEUROLOGICAL BOWEL NUTRITION STATUS      Incontinent Diet (regular)  AMBULATORY STATUS COMMUNICATION OF NEEDS Skin   Limited Assist Verbally Normal                       Personal Care Assistance Level of Assistance  Bathing, Feeding, Dressing Bathing  Assistance: Limited assistance Feeding assistance: Independent Dressing Assistance: Limited assistance     Functional Limitations Info  Speech     Speech Info: Impaired (dysarthria)    SPECIAL CARE FACTORS FREQUENCY                       Contractures Contractures Info: Not present    Additional Factors Info  Code Status, Allergies, Psychotropic Code Status Info: Full Allergies Info: Cogentin (Benztropine), Penicillins, Prolixin (Fluphenazine) Psychotropic Info: Remeron 15mg  daily at bed; Seroquel 50mg  2x/day; Seroquel 200mg  daily at bed         Current Medications (08/23/2022):  This is the current hospital active medication list Current Facility-Administered Medications  Medication Dose Route Frequency Provider Last Rate Last Admin   amantadine (SYMMETREL) capsule 100 mg  100 mg Oral BID Annita Brod, MD   100 mg at 08/23/22 0915   benztropine (COGENTIN) tablet 1 mg  1 mg Oral BID Annita Brod, MD   1 mg at 08/23/22 0915   cloZAPine (CLOZARIL) tablet 12.5 mg  12.5 mg Oral BID Annita Brod, MD   12.5 mg at 08/23/22 0915   feeding supplement (BOOST / RESOURCE BREEZE) liquid 1 Container  1 Container Oral TID BM Lavina Hamman, MD   1 Container at 08/23/22 0916   hydrALAZINE (APRESOLINE) injection 10 mg  10 mg Intravenous Q4H PRN Lavina Hamman, MD   10 mg at 08/22/22 [phone]  levETIRAcetam (KEPPRA) IVPB 500 mg/100 mL premix  500 mg Intravenous Q12H Chesley Mires, MD 340 mL/hr at 08/23/22 0914 500 mg at 08/23/22 0914   lisinopril (ZESTRIL) tablet 10 mg  10 mg Oral Daily Annita Brod, MD   10 mg at 08/23/22 0915   metoprolol tartrate (LOPRESSOR) injection 5 mg  5 mg Intravenous Q6H PRN Annita Brod, MD   5 mg at 08/22/22 1759   mirtazapine (REMERON SOL-TAB) disintegrating tablet 15 mg  15 mg Oral QHS Annita Brod, MD   15 mg at 08/22/22 2207   pantoprazole (PROTONIX) EC tablet 40 mg  40 mg Oral Q0600 Jerene Bears, MD   40 mg at 08/23/22 0506    polyethylene glycol (MIRALAX / GLYCOLAX) packet 17 g  17 g Oral BID Yetta Flock, MD   17 g at 08/23/22 0915   QUEtiapine (SEROQUEL) tablet 200 mg  200 mg Oral QHS Annita Brod, MD   200 mg at 08/22/22 2206   QUEtiapine (SEROQUEL) tablet 50 mg  50 mg Oral BID Annita Brod, MD   50 mg at 08/23/22 0900   simethicone (MYLICON) chewable tablet 80 mg  80 mg Oral QID Lavina Hamman, MD   80 mg at 08/23/22 0915     Discharge Medications: TAKE these medications     amantadine 100 MG capsule Commonly known as: SYMMETREL Take 100 mg by mouth 2 (two) times daily.    benztropine 1 MG tablet Commonly known as: COGENTIN Take 1 mg by mouth 2 (two) times daily.    cetirizine 10 MG tablet Commonly known as: ZYRTEC Take 10 mg by mouth daily.    cloZAPine 25 MG tablet Commonly known as: CLOZARIL Take 0.5 tablets (12.5 mg total) by mouth 2 (two) times daily for 3 days, THEN 1 tablet (25 mg total) 2 (two) times daily for 3 days, THEN 2 tablets (50 mg total) 2 (two) times daily. Start taking on: August 23, 2022 What changed:  medication strength See the new instructions.    dapagliflozin propanediol 10 MG Tabs tablet Commonly known as: FARXIGA Take 10 mg by mouth daily after breakfast.    divalproex 500 MG 24 hr tablet Commonly known as: DEPAKOTE ER Take 500 mg by mouth 2 (two) times daily.    hydrOXYzine 25 MG tablet Commonly known as: ATARAX Take 25 mg by mouth 2 (two) times daily as needed for anxiety.    levETIRAcetam 500 MG tablet Commonly known as: KEPPRA Take 500 mg by mouth 2 (two) times daily.    lisinopril 10 MG tablet Commonly known as: ZESTRIL Take 10 mg by mouth daily.    magnesium oxide 400 (240 Mg) MG tablet Commonly known as: MAG-OX Take 1 tablet (400 mg total) by mouth daily.    metFORMIN 1000 MG tablet Commonly known as: GLUCOPHAGE Take 1,000 mg by mouth 2 (two) times daily with a meal.    mirtazapine 15 MG disintegrating tablet Commonly  known as: REMERON SOL-TAB Take 15 mg by mouth at bedtime.    polyethylene glycol 17 g packet Commonly known as: MIRALAX / GLYCOLAX Take 17 g by mouth 2 (two) times daily. Hold for excess diarrhea    QUEtiapine 200 MG tablet Commonly known as: SEROQUEL Take 1 tablet (200 mg total) by mouth at bedtime.    QUEtiapine 50 MG tablet Commonly known as: SEROQUEL Take 1 tablet (50 mg total) by mouth See admin instructions. Take 50 mg (1 tablet) by mouth every  morning and at 3 pm.    traZODone 100 MG tablet Commonly known as: DESYREL Take 1 tablet (100 mg total) by mouth at bedtime.    zolpidem 5 MG tablet Commonly known as: AMBIEN Take 1 tablet (5 mg total) by mouth at bedtime as needed for sleep.    Relevant Imaging Results:  Relevant Lab Results:   Additional Information SS#: [id number]  Geralynn Ochs, [place]

## 2022-08-25 ENCOUNTER — Ambulatory Visit (HOSPITAL_COMMUNITY)
Admission: RE | Admit: 2022-08-25 | Discharge: 2022-08-25 | Disposition: A | Discharge status: Home / Self Care | Payer: Medicaid Other | Source: Ambulatory Visit | Attending: Neurology | Admitting: Neurology

## 2022-08-25 DIAGNOSIS — Z87898 Personal history of other specified conditions: Secondary | ICD-10-CM | POA: Diagnosis present

## 2022-08-25 DIAGNOSIS — R270 Ataxia, unspecified: Secondary | ICD-10-CM | POA: Diagnosis present

## 2022-08-25 MED ORDER — GADOBUTROL 1 MMOL/ML IV SOLN
7.0000 mL | Freq: Once | INTRAVENOUS | Status: AC | PRN
  Administered 2022-08-25: 7 mL via INTRAVENOUS

## 2022-08-27 NOTE — Procedures (Signed)
ELECTROENCEPHALOGRAM REPORT  Date of Study: 08/12/2022  Patient's Name: Ronald Roman MRN: [id number] Date of Birth: 01/25/1961  Referring Provider: Dr. Patrcia Dolly  Clinical History: This is a 62 year old man with a history of seizures with altered mental status. EEG for classification.  Medications: Depakote, Keppra, mirtazapine, Trazodone, Seroquel  Technical Summary: A multichannel digital EEG recording measured by the international 10-20 system with electrodes applied with paste and impedances below 5000 ohms performed in our laboratory with EKG monitoring in an awake and asleep patient.  Hyperventilation and photic stimulation were not performed.  The digital EEG was referentially recorded, reformatted, and digitally filtered in a variety of bipolar and referential montages for optimal display.    Description: The patient is awake and asleep during the recording. There is head movement artifact obscuring EEG during wakefulness. In between artifact, there is a symmetric, medium voltage 9 Hz posterior dominant rhythm that attenuates with eye opening.  The record is symmetric.  During drowsiness and sleep, there is an increase in theta slowing of the background with vertex waves seen. There were no epileptiform discharges or electrographic seizures seen.    EKG lead was unremarkable.  Impression: This awake and asleep EEG is normal.    Clinical Correlation: A normal EEG does not exclude a clinical diagnosis of epilepsy.  If further clinical questions remain, prolonged EEG may be helpful.  Clinical correlation is advised.   Patrcia Dolly, M.D.

## 2022-09-03 ENCOUNTER — Ambulatory Visit: Payer: Medicaid Other | Admitting: Podiatry

## 2022-10-07 ENCOUNTER — Ambulatory Visit: Payer: Medicaid Other | Admitting: Podiatry

## 2022-10-31 ENCOUNTER — Inpatient Hospital Stay (HOSPITAL_COMMUNITY)
Admission: EM | Admit: 2022-10-31 | Discharge: 2022-11-03 | DRG: 683 | Disposition: A | Discharge status: Nursing Home (Includes ICF) | Payer: Medicaid Other | Source: Skilled Nursing Facility | Attending: Family Medicine | Admitting: Family Medicine

## 2022-10-31 ENCOUNTER — Encounter (HOSPITAL_COMMUNITY): Payer: Self-pay | Admitting: Emergency Medicine

## 2022-10-31 ENCOUNTER — Other Ambulatory Visit: Payer: Self-pay

## 2022-10-31 ENCOUNTER — Emergency Department (HOSPITAL_COMMUNITY): Payer: Medicaid Other

## 2022-10-31 DIAGNOSIS — E872 Acidosis, unspecified: Secondary | ICD-10-CM | POA: Diagnosis present

## 2022-10-31 DIAGNOSIS — F200 Paranoid schizophrenia: Secondary | ICD-10-CM | POA: Diagnosis present

## 2022-10-31 DIAGNOSIS — Z880 Allergy status to penicillin: Secondary | ICD-10-CM

## 2022-10-31 DIAGNOSIS — Z888 Allergy status to other drugs, medicaments and biological substances status: Secondary | ICD-10-CM

## 2022-10-31 DIAGNOSIS — I10 Essential (primary) hypertension: Secondary | ICD-10-CM | POA: Diagnosis present

## 2022-10-31 DIAGNOSIS — R55 Syncope and collapse: Secondary | ICD-10-CM | POA: Diagnosis not present

## 2022-10-31 DIAGNOSIS — I959 Hypotension, unspecified: Secondary | ICD-10-CM

## 2022-10-31 DIAGNOSIS — Z7984 Long term (current) use of oral hypoglycemic drugs: Secondary | ICD-10-CM

## 2022-10-31 DIAGNOSIS — G40909 Epilepsy, unspecified, not intractable, without status epilepticus: Secondary | ICD-10-CM | POA: Diagnosis present

## 2022-10-31 DIAGNOSIS — K219 Gastro-esophageal reflux disease without esophagitis: Secondary | ICD-10-CM | POA: Diagnosis present

## 2022-10-31 DIAGNOSIS — R6250 Unspecified lack of expected normal physiological development in childhood: Secondary | ICD-10-CM | POA: Diagnosis present

## 2022-10-31 DIAGNOSIS — I951 Orthostatic hypotension: Secondary | ICD-10-CM | POA: Diagnosis present

## 2022-10-31 DIAGNOSIS — E119 Type 2 diabetes mellitus without complications: Secondary | ICD-10-CM | POA: Diagnosis present

## 2022-10-31 DIAGNOSIS — Z87891 Personal history of nicotine dependence: Secondary | ICD-10-CM

## 2022-10-31 DIAGNOSIS — E861 Hypovolemia: Secondary | ICD-10-CM | POA: Diagnosis present

## 2022-10-31 DIAGNOSIS — Z79899 Other long term (current) drug therapy: Secondary | ICD-10-CM

## 2022-10-31 DIAGNOSIS — E785 Hyperlipidemia, unspecified: Secondary | ICD-10-CM | POA: Diagnosis present

## 2022-10-31 DIAGNOSIS — N179 Acute kidney failure, unspecified: Principal | ICD-10-CM | POA: Diagnosis present

## 2022-10-31 DIAGNOSIS — E860 Dehydration: Secondary | ICD-10-CM | POA: Diagnosis present

## 2022-10-31 LAB — TROPONIN I (HIGH SENSITIVITY): Troponin I (High Sensitivity): 6 ng/L (ref <18)

## 2022-10-31 LAB — LACTIC ACID, PLASMA
Lactic Acid, Venous: 2.6 mmol/L (ref 0.5–1.9)
Lactic Acid, Venous: 3.6 mmol/L (ref 0.5–1.9)

## 2022-10-31 LAB — URINALYSIS, ROUTINE W REFLEX MICROSCOPIC
Bacteria, UA: NONE SEEN
Bilirubin Urine: NEGATIVE
Glucose, UA: >=500 mg/dL — AB
Hgb urine dipstick: NEGATIVE
Ketones, ur: NEGATIVE mg/dL
Leukocytes,Ua: NEGATIVE
Nitrite: NEGATIVE
Protein, ur: NEGATIVE mg/dL
Specific Gravity, Urine: 1.006 (ref 1.005–1.030)
pH: 7 (ref 5.0–8.0)

## 2022-10-31 LAB — HEPATIC FUNCTION PANEL
ALT: 9 U/L (ref 0–44)
AST: 15 U/L (ref 15–41)
Albumin: 3.2 g/dL — ABNORMAL LOW (ref 3.5–5.0)
Alkaline Phosphatase: 57 U/L (ref 38–126)
Bilirubin, Direct: 0.1 mg/dL (ref 0.0–0.2)
Total Bilirubin: <0.1 mg/dL — ABNORMAL LOW (ref 0.3–1.2)
Total Protein: 6.3 g/dL — ABNORMAL LOW (ref 6.5–8.1)

## 2022-10-31 LAB — CBC
HCT: 33.9 % — ABNORMAL LOW (ref 39.0–52.0)
Hemoglobin: 10.7 g/dL — ABNORMAL LOW (ref 13.0–17.0)
MCH: 27.2 pg (ref 26.0–34.0)
MCHC: 31.6 g/dL (ref 30.0–36.0)
MCV: 86.3 fL (ref 80.0–100.0)
Platelets: 230 10*3/uL (ref 150–400)
RBC: 3.93 MIL/uL — ABNORMAL LOW (ref 4.22–5.81)
RDW: 14.2 % (ref 11.5–15.5)
WBC: 6.8 10*3/uL (ref 4.0–10.5)
nRBC: 0 % (ref 0.0–0.2)

## 2022-10-31 LAB — BASIC METABOLIC PANEL
Anion gap: 7 (ref 5–15)
BUN: 32 mg/dL — ABNORMAL HIGH (ref 8–23)
CO2: 24 mmol/L (ref 22–32)
Calcium: 9.2 mg/dL (ref 8.9–10.3)
Chloride: 107 mmol/L (ref 98–111)
Creatinine, Ser: 1.47 mg/dL — ABNORMAL HIGH (ref 0.61–1.24)
GFR, Estimated: 54 mL/min — ABNORMAL LOW (ref >60)
Glucose, Bld: 109 mg/dL — ABNORMAL HIGH (ref 70–99)
Potassium: 5.1 mmol/L (ref 3.5–5.1)
Sodium: 138 mmol/L (ref 135–145)

## 2022-10-31 LAB — CBG MONITORING, ED: Glucose-Capillary: 97 mg/dL (ref 70–99)

## 2022-10-31 MED ORDER — LISINOPRIL 10 MG PO TABS
10.0000 mg | ORAL_TABLET | Freq: Every day | ORAL | Status: DC
  Administered 2022-11-01 – 2022-11-03 (×3): 10 mg via ORAL
  Filled 2022-10-31 (×3): qty 1

## 2022-10-31 MED ORDER — AMANTADINE HCL 100 MG PO CAPS
100.0000 mg | ORAL_CAPSULE | Freq: Two times a day (BID) | ORAL | Status: DC
  Administered 2022-10-31 – 2022-11-03 (×6): 100 mg via ORAL
  Filled 2022-10-31 (×7): qty 1

## 2022-10-31 MED ORDER — TRAZODONE HCL 50 MG PO TABS: 100.0000 mg | ORAL_TABLET | Freq: Every day | ORAL | Status: DC

## 2022-10-31 MED ORDER — QUETIAPINE FUMARATE 100 MG PO TABS: 100.0000 mg | ORAL_TABLET | Freq: Every day | ORAL | Status: DC

## 2022-10-31 MED ORDER — DIVALPROEX SODIUM ER 500 MG PO TB24
500.0000 mg | ORAL_TABLET | Freq: Two times a day (BID) | ORAL | Status: DC
  Administered 2022-10-31 – 2022-11-03 (×6): 500 mg via ORAL
  Filled 2022-10-31 (×7): qty 1

## 2022-10-31 MED ORDER — SODIUM CHLORIDE 0.9 % IV BOLUS
1000.0000 mL | Freq: Once | INTRAVENOUS | Status: AC
  Administered 2022-10-31: 1000 mL via INTRAVENOUS

## 2022-10-31 MED ORDER — ACETAMINOPHEN 325 MG PO TABS: 650.0000 mg | ORAL_TABLET | Freq: Four times a day (QID) | ORAL | Status: DC | PRN

## 2022-10-31 MED ORDER — LACTATED RINGERS IV BOLUS
1000.0000 mL | Freq: Once | INTRAVENOUS | Status: AC
  Administered 2022-10-31: 1000 mL via INTRAVENOUS

## 2022-10-31 MED ORDER — QUETIAPINE FUMARATE 100 MG PO TABS: 200.0000 mg | ORAL_TABLET | Freq: Every day | ORAL | Status: DC

## 2022-10-31 MED ORDER — SODIUM CHLORIDE 0.9 % IV SOLN: INTRAVENOUS | Status: AC

## 2022-10-31 MED ORDER — PROCHLORPERAZINE EDISYLATE 10 MG/2ML IJ SOLN: 5.0000 mg | Freq: Four times a day (QID) | INTRAMUSCULAR | Status: DC | PRN

## 2022-10-31 MED ORDER — LEVETIRACETAM 500 MG PO TABS
500.0000 mg | ORAL_TABLET | Freq: Two times a day (BID) | ORAL | Status: DC
  Administered 2022-10-31 – 2022-11-03 (×6): 500 mg via ORAL
  Filled 2022-10-31 (×6): qty 1

## 2022-10-31 MED ORDER — ENOXAPARIN SODIUM 40 MG/0.4ML IJ SOSY
40.0000 mg | PREFILLED_SYRINGE | INTRAMUSCULAR | Status: DC
  Administered 2022-10-31 – 2022-11-02 (×3): 40 mg via SUBCUTANEOUS
  Filled 2022-10-31 (×3): qty 0.4

## 2022-10-31 MED ORDER — IOHEXOL 350 MG/ML SOLN
100.0000 mL | Freq: Once | INTRAVENOUS | Status: AC | PRN
  Administered 2022-10-31: 100 mL via INTRAVENOUS

## 2022-10-31 MED ORDER — BENZTROPINE MESYLATE 1 MG PO TABS
1.0000 mg | ORAL_TABLET | Freq: Two times a day (BID) | ORAL | Status: DC
  Administered 2022-10-31 – 2022-11-03 (×6): 1 mg via ORAL
  Filled 2022-10-31 (×7): qty 1

## 2022-10-31 MED ORDER — LORATADINE 10 MG PO TABS
10.0000 mg | ORAL_TABLET | Freq: Every day | ORAL | Status: DC
  Administered 2022-11-01 – 2022-11-03 (×3): 10 mg via ORAL
  Filled 2022-10-31 (×3): qty 1

## 2022-10-31 MED ORDER — ONDANSETRON HCL 4 MG/2ML IJ SOLN: 4.0000 mg | Freq: Four times a day (QID) | INTRAMUSCULAR | Status: DC | PRN

## 2022-10-31 MED ORDER — POLYETHYLENE GLYCOL 3350 17 G PO PACK: 17.0000 g | PACK | Freq: Every day | ORAL | Status: DC | PRN

## 2022-10-31 MED ORDER — SODIUM CHLORIDE 0.9 % IV BOLUS: 1000.0000 mL | Freq: Once | INTRAVENOUS | Status: DC

## 2022-10-31 MED ORDER — TRAZODONE HCL 50 MG PO TABS
50.0000 mg | ORAL_TABLET | Freq: Every day | ORAL | Status: DC
  Administered 2022-10-31 – 2022-11-02 (×3): 50 mg via ORAL
  Filled 2022-10-31 (×3): qty 1

## 2022-10-31 MED ORDER — HYDROXYZINE HCL 25 MG PO TABS: 25.0000 mg | ORAL_TABLET | Freq: Two times a day (BID) | ORAL | Status: DC | PRN

## 2022-10-31 MED ORDER — QUETIAPINE FUMARATE 50 MG PO TABS
100.0000 mg | ORAL_TABLET | Freq: Every day | ORAL | Status: DC
  Administered 2022-10-31 – 2022-11-02 (×3): 100 mg via ORAL
  Filled 2022-10-31: qty 2
  Filled 2022-10-31: qty 1
  Filled 2022-10-31 (×2): qty 2

## 2022-10-31 NOTE — ED Notes (Signed)
Sandwich & drink given to pt.

## 2022-10-31 NOTE — ED Notes (Addendum)
Patient observed walking out of room in urine soaked clothing and gown. Bed linens soaked in urine, all monitoring devices removed by patient and thrown on floor.  Patient cleaned, provided with dry paper scrubs and a warm blanket. Bed linens changed. Patient instructed to remain in bed because of his syncopal episode with LOC earlier today.

## 2022-10-31 NOTE — ED Notes (Signed)
Patient transported to CT 

## 2022-10-31 NOTE — ED Notes (Signed)
Pt ambulated well in the hallway with assistance, no distress was noted.

## 2022-10-31 NOTE — H&P (Addendum)
History and Physical  Ronald Roman:[id number] DOB: 03-Jan-1961 DOA: 10/31/2022  Referring physician: Ralph Leyden, PA-EDP  PCP: [facility]  Outpatient Specialists: None Patient coming from: Assisted living facility.  Chief Complaint: Witnessed syncope.  HPI: Ronald Roman is a 62 y.o. male with medical history significant for essential hypertension, type 2 diabetes, seizure disorder, paranoid schizophrenia, who presented to [facility] ED due to witnessed syncope at assisted living facility.  The patient is unable to provide a history.  He is alert and minimally interactive.  Reportedly, the patient came out of the lunch rooml earlier today.  He was feeling unwell.  His gait was noted to be unsteady as he was walking, lost consciousness and was able to be caught by the staff.  EMS was activated.  Upon EMS evaluation patient was hypotensive with systolic blood pressure in the 80s.  No reported seizure-like activity.  He received IV fluid bolus with improvement of his systolic blood pressure.  The patient was brought into the ED for further evaluation.  In the ED, orthostatic vital signs were done and were positive.  UA was negative for pyuria and positive for glucosuria.  Lab studies were remarkable for lactic acidosis which up trended despite IV fluid hydration.  Due to concern for worsening lactic acidosis, EDP requested admission for further management.  The patient was admitted by [facility], hospitalist service, to telemetry cardiac unit as observation status.  ED Course: Temperature 98.2.  BP 122/86, pulse 85, respiratory 16, saturation 95% on room air.  Lab studies markable for serum glucose 109, BUN 32, creatinine 1.47, GFR 54, albumin 3.2, total protein 6.3.  Lactic acid 2.6, repeat 3.6.  Hemoglobin 10.7.  WBC 6.8, platelet count 230.  Review of Systems: Review of systems as noted in the HPI. All other systems reviewed and are negative.   Past Medical History:  Diagnosis Date    Diabetes mellitus without complication (HCC)    GERD (gastroesophageal reflux disease)    Hyperlipidemia    Hypertension    Paranoid schizophrenia (HCC)    Seizures (HCC)    Sleep apnea    Uric acid nephrolithiasis    Past Surgical History:  Procedure Laterality Date   ESOPHAGOGASTRODUODENOSCOPY (EGD) WITH PROPOFOL N/A 08/21/2022   Procedure: ESOPHAGOGASTRODUODENOSCOPY (EGD) WITH PROPOFOL;  Surgeon: Beverley Fiedler, MD;  Location: [facility] ENDOSCOPY;  Service: Gastroenterology;  Laterality: N/A;    Social History:  reports that he has quit smoking. His smoking use included cigarettes. He smoked an average of 1 pack per day. He has never used smokeless tobacco. He reports that he does not drink alcohol and does not use drugs.   Allergies  Allergen Reactions   Cogentin [Benztropine] Other (See Comments)    Per MAR    Penicillins Other (See Comments)    Per MAR - unable to verify patients PCN reaction    Prolixin [Fluphenazine] Other (See Comments)    Per MAR     Family history: None reported.  Prior to Admission medications   Medication Sig Start Date End Date Taking? Authorizing Provider  amantadine (SYMMETREL) 100 MG capsule Take 100 mg by mouth 2 (two) times daily.    [provider]  benztropine (COGENTIN) 1 MG tablet Take 1 mg by mouth 2 (two) times daily.    [provider]  cetirizine (ZYRTEC) 10 MG tablet Take 10 mg by mouth daily.    [provider]  cloZAPine (CLOZARIL) 25 MG tablet Take 0.5 tablets (12.5 mg total)  by mouth 2 (two) times daily for 3 days, THEN 1 tablet (25 mg total) 2 (two) times daily for 3 days, THEN 2 tablets (50 mg total) 2 (two) times daily. 08/23/22 09/28/22  Hollice Espy, MD  dapagliflozin propanediol (FARXIGA) 10 MG TABS tablet Take 10 mg by mouth daily after breakfast.    [provider]  divalproex (DEPAKOTE ER) 500 MG 24 hr tablet Take 500 mg by mouth 2 (two) times daily.    [provider]   hydrOXYzine (ATARAX) 25 MG tablet Take 25 mg by mouth 2 (two) times daily as needed for anxiety.    [provider]  levETIRAcetam (KEPPRA) 500 MG tablet Take 500 mg by mouth 2 (two) times daily.    [provider]  lisinopril (ZESTRIL) 10 MG tablet Take 10 mg by mouth daily.    [provider]  magnesium oxide (MAG-OX) 400 (240 Mg) MG tablet Take 1 tablet (400 mg total) by mouth daily. 10/14/20   Pokhrel, Rebekah Chesterfield, MD  metFORMIN (GLUCOPHAGE) 1000 MG tablet Take 1,000 mg by mouth 2 (two) times daily with a meal.    [provider]  mirtazapine (REMERON SOL-TAB) 15 MG disintegrating tablet Take 15 mg by mouth at bedtime.    [provider]  polyethylene glycol (MIRALAX / GLYCOLAX) 17 g packet Take 17 g by mouth 2 (two) times daily. Hold for excess diarrhea 08/23/22   Hollice Espy, MD  QUEtiapine (SEROQUEL) 200 MG tablet Take 1 tablet (200 mg total) by mouth at bedtime. 08/23/22   Hollice Espy, MD  QUEtiapine (SEROQUEL) 50 MG tablet Take 1 tablet (50 mg total) by mouth See admin instructions. Take 50 mg (1 tablet) by mouth every morning and at 3 pm. 08/23/22   Hollice Espy, MD  traZODone (DESYREL) 100 MG tablet Take 1 tablet (100 mg total) by mouth at bedtime. 08/23/22   Hollice Espy, MD  zolpidem (AMBIEN) 5 MG tablet Take 1 tablet (5 mg total) by mouth at bedtime as needed for sleep. 08/23/22   Hollice Espy, MD    Physical Exam: BP 122/86   Pulse (!) 55   Temp 98.2 F (36.8 C)   Resp 15   Ht 5\' 6"  (1.676 m)   Wt 69.9 kg   SpO2 95%   BMI 24.86 kg/m   General: 62 y.o. year-old male well developed well nourished in no acute distress.  Alert and minimally interactive. Cardiovascular: Regular rate and rhythm with no rubs or gallops.  No thyromegaly or JVD noted.  No lower extremity edema. 2/4 pulses in all 4 extremities. Respiratory: Clear to auscultation with no wheezes or rales. Good inspiratory effort. Abdomen: Soft nontender  nondistended with normal bowel sounds x4 quadrants. Muskuloskeletal: No cyanosis, clubbing or edema noted bilaterally Neuro: CN II-XII intact, strength, sensation, reflexes Skin: No ulcerative lesions noted or rashes Psychiatry: Mood is appropriate for condition and setting          Labs on Admission:  Basic Metabolic Panel: Recent Labs  Lab 10/31/22 1055  NA 138  K 5.1  CL 107  CO2 24  GLUCOSE 109*  BUN 32*  CREATININE 1.47*  CALCIUM 9.2   Liver Function Tests: Recent Labs  Lab 10/31/22 1437  AST 15  ALT 9  ALKPHOS 57  BILITOT <0.1*  PROT 6.3*  ALBUMIN 3.2*   No results for input(s): "LIPASE", "AMYLASE" in the last 168 hours. No results for input(s): "AMMONIA" in the last 168 hours.  CBC: Recent Labs  Lab 10/31/22 1055  WBC 6.8  HGB 10.7*  HCT 33.9*  MCV 86.3  PLT 230   Cardiac Enzymes: No results for input(s): "CKTOTAL", "CKMB", "CKMBINDEX", "TROPONINI" in the last 168 hours.  BNP (last 3 results) Recent Labs    08/22/22 1512  BNP 94.0    ProBNP (last 3 results) No results for input(s): "PROBNP" in the last 8760 hours.  CBG: Recent Labs  Lab 10/31/22 1317  GLUCAP 97    Radiological Exams on Admission: CT Angio Chest PE W and/or Wo Contrast  Result Date: 10/31/2022 CLINICAL DATA:  Pulmonary embolism (PE) suspected, high prob; Abdominal pain, acute, nonlocalized. EXAM: CT ANGIOGRAPHY CHEST CT ABDOMEN AND PELVIS WITH CONTRAST TECHNIQUE: Multidetector CT imaging of the chest was performed using the standard protocol during bolus administration of intravenous contrast. Multiplanar CT image reconstructions and MIPs were obtained to evaluate the vascular anatomy. Multidetector CT imaging of the abdomen and pelvis was performed using the standard protocol during bolus administration of intravenous contrast. RADIATION DOSE REDUCTION: This exam was performed according to the departmental dose-optimization program which includes automated exposure control,  adjustment of the mA and/or kV according to patient size and/or use of iterative reconstruction technique. CONTRAST:  OMNIPAQUE IOHEXOL 350 MG/ML SOLN COMPARISON:  CT abdomen/pelvis 08/15/2022. FINDINGS: CTA CHEST FINDINGS Cardiovascular: Satisfactory opacification of the pulmonary arteries to the segmental level. No evidence of pulmonary embolism. Normal heart size. No pericardial effusion. Coronary artery calcifications. Mediastinum/Nodes: No enlarged mediastinal, hilar, or axillary lymph nodes. Thyroid gland, trachea, and esophagus demonstrate no significant findings. Lungs/Pleura: Lungs are clear. No pleural effusion or pneumothorax. Musculoskeletal: Within the limitations of respiratory motion artifact, no chest wall abnormality or significant bony findings. Review of the MIP images confirms the above findings. CT ABDOMEN and PELVIS FINDINGS Hepatobiliary: No focal liver abnormality is seen. No gallstones, gallbladder wall thickening, or biliary dilatation. Pancreas: Unremarkable. No pancreatic ductal dilatation or surrounding inflammatory changes. Spleen: Normal. Adrenals/Urinary Tract: Adrenal glands are unremarkable. Within limits of motion artifact, kidneys are unremarkable without mass, calculi or hydronephrosis. Bladder is unremarkable. Stomach/Bowel: Normal stomach and duodenum. No dilated loops of small bowel. Normal appendix is visualized on coronal image 95 series 6. Large burden of stool throughout the colon. No bowel wall thickening or surrounding inflammation. Vascular/Lymphatic: Aortic atherosclerosis. No enlarged abdominal or pelvic lymph nodes. Reproductive: Prostate is unremarkable. Other: No abdominal wall hernia or abnormality. No abdominopelvic ascites. Musculoskeletal: Lower lumbar spondylosis. Degenerative changes of the left-greater-than-right SI joints. Review of the MIP images confirms the above findings. IMPRESSION: 1. No evidence of pulmonary embolism or other acute abnormality  in the chest. 2. No acute abnormality in the abdomen or pelvis. 3. Large burden of stool throughout the colon. Correlate for constipation. 4. Coronary artery disease. 5. Degenerative changes of the left-greater-than-right SI joints. Aortic Atherosclerosis (ICD10-I70.0). Electronically Signed   By: Orvan Falconer M.D.   On: 10/31/2022 13:20   CT ABDOMEN PELVIS W CONTRAST  Result Date: 10/31/2022 CLINICAL DATA:  Pulmonary embolism (PE) suspected, high prob; Abdominal pain, acute, nonlocalized. EXAM: CT ANGIOGRAPHY CHEST CT ABDOMEN AND PELVIS WITH CONTRAST TECHNIQUE: Multidetector CT imaging of the chest was performed using the standard protocol during bolus administration of intravenous contrast. Multiplanar CT image reconstructions and MIPs were obtained to evaluate the vascular anatomy. Multidetector CT imaging of the abdomen and pelvis was performed using the standard protocol during bolus administration of intravenous contrast. RADIATION DOSE REDUCTION: This exam was performed according to the departmental dose-optimization  program which includes automated exposure control, adjustment of the mA and/or kV according to patient size and/or use of iterative reconstruction technique. CONTRAST:  OMNIPAQUE IOHEXOL 350 MG/ML SOLN COMPARISON:  CT abdomen/pelvis 08/15/2022. FINDINGS: CTA CHEST FINDINGS Cardiovascular: Satisfactory opacification of the pulmonary arteries to the segmental level. No evidence of pulmonary embolism. Normal heart size. No pericardial effusion. Coronary artery calcifications. Mediastinum/Nodes: No enlarged mediastinal, hilar, or axillary lymph nodes. Thyroid gland, trachea, and esophagus demonstrate no significant findings. Lungs/Pleura: Lungs are clear. No pleural effusion or pneumothorax. Musculoskeletal: Within the limitations of respiratory motion artifact, no chest wall abnormality or significant bony findings. Review of the MIP images confirms the above findings. CT ABDOMEN and  PELVIS FINDINGS Hepatobiliary: No focal liver abnormality is seen. No gallstones, gallbladder wall thickening, or biliary dilatation. Pancreas: Unremarkable. No pancreatic ductal dilatation or surrounding inflammatory changes. Spleen: Normal. Adrenals/Urinary Tract: Adrenal glands are unremarkable. Within limits of motion artifact, kidneys are unremarkable without mass, calculi or hydronephrosis. Bladder is unremarkable. Stomach/Bowel: Normal stomach and duodenum. No dilated loops of small bowel. Normal appendix is visualized on coronal image 95 series 6. Large burden of stool throughout the colon. No bowel wall thickening or surrounding inflammation. Vascular/Lymphatic: Aortic atherosclerosis. No enlarged abdominal or pelvic lymph nodes. Reproductive: Prostate is unremarkable. Other: No abdominal wall hernia or abnormality. No abdominopelvic ascites. Musculoskeletal: Lower lumbar spondylosis. Degenerative changes of the left-greater-than-right SI joints. Review of the MIP images confirms the above findings. IMPRESSION: 1. No evidence of pulmonary embolism or other acute abnormality in the chest. 2. No acute abnormality in the abdomen or pelvis. 3. Large burden of stool throughout the colon. Correlate for constipation. 4. Coronary artery disease. 5. Degenerative changes of the left-greater-than-right SI joints. Aortic Atherosclerosis (ICD10-I70.0). Electronically Signed   By: Orvan Falconer M.D.   On: 10/31/2022 13:20    EKG: I independently viewed the EKG done and my findings are as followed: Normal sinus rhythm rate of 83.  Nonspecific ST-T changes.  QTc 413.  Assessment/Plan Present on Admission:  Syncope  Principal Problem:   Syncope  Syncope, possibly secondary to hypovolemia Positive orthostatic vital signs Lactic acidosis Continue IV fluid hydration Repeat orthostatic vital signs in the morning Follow 2D echo to rule out any cardiac structural abnormalities. Continue fall  precautions.  Seizure disorder No reported seizure-like activities Resume home AEDs Seizure precautions  History of paranoid schizophrenia Resume home regimen.    DVT prophylaxis: Subcu Lovenox daily  Code Status: Full code  Family Communication: None at bedside.  Disposition Plan: Admitted to telemetry cardiac unit.  Consults called: None.  Admission status: Observation status.   Status is: Observation    [name] MD [facility] Pager [phone]  If 7PM-7AM, please contact night-coverage [email or web address] Password [facility]  10/31/2022, 9:54 PM

## 2022-10-31 NOTE — ED Triage Notes (Signed)
Per [facility] coming [facility] for witnessed syncopal episode. Was eased down to the floor. Initially hypotensive and given 500 CC NS. Patient at baseline.

## 2022-10-31 NOTE — ED Provider Notes (Signed)
[place] EMERGENCY DEPARTMENT AT [facility] Provider Note   CSN: [id number] Arrival date & time: 10/31/22  1035     History  Chief Complaint  Patient presents with   Loss of Consciousness    Ronald Roman is a 62 y.o. male.  Patient with history of diabetes, hypertension, hyperlipidemia, GERD, paranoid schizophrenia, seizures, impaired cognitive ability presents today from [facility] with complaints of loss of consciousness. Patient difficult historian, discussed with patients facility who states that he came out of the lunch room earlier today and told staff he wasn't feeling good and was walking very unsteady and then lost consciousness and fell to the ground. Staff was able to catch the patient and ease him to the ground and therefore he did not injure himself. Staff denies any witnessed seizure-like activity. States that prior to this he has been acting normally without any complaints. They do note that he ate lunch today. He has not missed any medications. When patient is asked about details regarding what happened, he states 'I had a seizure, or maybe it was a heart attack.' Does state that he felt like he was going to pass out before he did. Denies cardiac history. Denies chest pain or shortness of breath. Patient denies any complaints, states he feels normal without fevers, chills, nausea, vomiting, diarrhea, or abdominal pain. States he has been eating and drinking normally.  The history is provided by the patient. The history is limited by a developmental delay. No language interpreter was used.  Loss of Consciousness      Home Medications Prior to Admission medications   Medication Sig Start Date End Date Taking? Authorizing Provider  amantadine (SYMMETREL) 100 MG capsule Take 100 mg by mouth 2 (two) times daily.    [provider]  benztropine (COGENTIN) 1 MG tablet Take 1 mg by mouth 2 (two) times daily.    [provider]   cetirizine (ZYRTEC) 10 MG tablet Take 10 mg by mouth daily.    [provider]  cloZAPine (CLOZARIL) 25 MG tablet Take 0.5 tablets (12.5 mg total) by mouth 2 (two) times daily for 3 days, THEN 1 tablet (25 mg total) 2 (two) times daily for 3 days, THEN 2 tablets (50 mg total) 2 (two) times daily. 08/23/22 09/28/22  Hollice Espy, MD  dapagliflozin propanediol (FARXIGA) 10 MG TABS tablet Take 10 mg by mouth daily after breakfast.    [provider]  divalproex (DEPAKOTE ER) 500 MG 24 hr tablet Take 500 mg by mouth 2 (two) times daily.    [provider]  hydrOXYzine (ATARAX) 25 MG tablet Take 25 mg by mouth 2 (two) times daily as needed for anxiety.    [provider]  levETIRAcetam (KEPPRA) 500 MG tablet Take 500 mg by mouth 2 (two) times daily.    [provider]  lisinopril (ZESTRIL) 10 MG tablet Take 10 mg by mouth daily.    [provider]  magnesium oxide (MAG-OX) 400 (240 Mg) MG tablet Take 1 tablet (400 mg total) by mouth daily. 10/14/20   Pokhrel, Rebekah Chester[place], MD  metFORMIN (GLUCOPHAGE) 1000 MG tablet Take 1,000 mg by mouth 2 (two) times daily with a meal.    [provider]  mirtazapine (REMERON SOL-TAB) 15 MG disintegrating tablet Take 15 mg by mouth at bedtime.    [provider]  polyethylene glycol (MIRALAX / GLYCOLAX) 17 g packet Take 17 g by mouth 2 (two) times daily. Hold for  excess diarrhea 08/23/22   Hollice Espy, MD  QUEtiapine (SEROQUEL) 200 MG tablet Take 1 tablet (200 mg total) by mouth at bedtime. 08/23/22   Hollice Espy, MD  QUEtiapine (SEROQUEL) 50 MG tablet Take 1 tablet (50 mg total) by mouth See admin instructions. Take 50 mg (1 tablet) by mouth every morning and at 3 pm. 08/23/22   Hollice Espy, MD  traZODone (DESYREL) 100 MG tablet Take 1 tablet (100 mg total) by mouth at bedtime. 08/23/22   Hollice Espy, MD  zolpidem (AMBIEN) 5 MG tablet Take 1 tablet (5 mg total) by mouth at bedtime as  needed for sleep. 08/23/22   Hollice Espy, MD      Allergies    Cogentin [benztropine], Penicillins, and Prolixin [fluphenazine]    Review of Systems   Review of Systems  Cardiovascular:  Positive for syncope.  Neurological:  Positive for syncope.  All other systems reviewed and are negative.   Physical Exam Updated Vital Signs BP 100/70   Pulse 77   Temp 97.8 F (36.6 C) (Oral)   Resp 18   Ht 5\' 6"  (1.676 m)   Wt 69.9 kg   SpO2 98%   BMI 24.86 kg/m  Physical Exam Vitals and nursing note reviewed.  Constitutional:      General: He is not in acute distress.    Appearance: Normal appearance. He is normal weight. He is not ill-appearing, toxic-appearing or diaphoretic.  HENT:     Head: Normocephalic and atraumatic.  Cardiovascular:     Rate and Rhythm: Normal rate and regular rhythm.     Heart sounds: Normal heart sounds.  Pulmonary:     Effort: Pulmonary effort is normal. No respiratory distress.     Breath sounds: Normal breath sounds.  Abdominal:     General: Abdomen is flat.     Palpations: Abdomen is soft.     Tenderness: There is no abdominal tenderness.  Musculoskeletal:        General: Normal range of motion.     Cervical back: Normal range of motion.     Right lower leg: No edema.     Left lower leg: No edema.  Skin:    General: Skin is warm and dry.  Neurological:     General: No focal deficit present.     Mental Status: He is alert and oriented to person, place, and time.  Psychiatric:        Mood and Affect: Mood normal.        Behavior: Behavior normal.     ED Results / Procedures / Treatments   Labs (all labs ordered are listed, but only abnormal results are displayed) Labs Reviewed  BASIC METABOLIC PANEL - Abnormal; Notable for the following components:      Result Value   Glucose, Bld 109 (*)    BUN 32 (*)    Creatinine, Ser 1.47 (*)    GFR, Estimated 54 (*)    All other components within normal limits  CBC - Abnormal; Notable for  the following components:   RBC 3.93 (*)    Hemoglobin 10.7 (*)    HCT 33.9 (*)    All other components within normal limits  URINALYSIS, ROUTINE W REFLEX MICROSCOPIC - Abnormal; Notable for the following components:   Color, Urine STRAW (*)    Glucose, UA >=500 (*)    All other components within normal limits  HEPATIC FUNCTION PANEL - Abnormal; Notable for the following components:  Total Protein 6.3 (*)    Albumin 3.2 (*)    Total Bilirubin <0.1 (*)    All other components within normal limits  LACTIC ACID, PLASMA - Abnormal; Notable for the following components:   Lactic Acid, Venous 2.6 (*)    All other components within normal limits  LACTIC ACID, PLASMA  CBG MONITORING, ED  TROPONIN I (HIGH SENSITIVITY)    EKG EKG Interpretation  Date/Time:  Sunday October 31 2022 11:05:56 EDT Ventricular Rate:  83 PR Interval:  152 QRS Duration: 82 QT Interval:  352 QTC Calculation: 413 R Axis:   48 Text Interpretation: Normal sinus rhythm Normal ECG When compared with ECG of 15-Aug-2022 06:56, PREVIOUS ECG IS PRESENT Confirmed by Kristine Royal [phone]) on 10/31/2022 1:48:36 PM  Radiology CT Angio Chest PE W and/or Wo Contrast  Result Date: 10/31/2022 CLINICAL DATA:  Pulmonary embolism (PE) suspected, high prob; Abdominal pain, acute, nonlocalized. EXAM: CT ANGIOGRAPHY CHEST CT ABDOMEN AND PELVIS WITH CONTRAST TECHNIQUE: Multidetector CT imaging of the chest was performed using the standard protocol during bolus administration of intravenous contrast. Multiplanar CT image reconstructions and MIPs were obtained to evaluate the vascular anatomy. Multidetector CT imaging of the abdomen and pelvis was performed using the standard protocol during bolus administration of intravenous contrast. RADIATION DOSE REDUCTION: This exam was performed according to the departmental dose-optimization program which includes automated exposure control, adjustment of the mA and/or kV according to patient size  and/or use of iterative reconstruction technique. CONTRAST:  OMNIPAQUE IOHEXOL 350 MG/ML SOLN COMPARISON:  CT abdomen/pelvis 08/15/2022. FINDINGS: CTA CHEST FINDINGS Cardiovascular: Satisfactory opacification of the pulmonary arteries to the segmental level. No evidence of pulmonary embolism. Normal heart size. No pericardial effusion. Coronary artery calcifications. Mediastinum/Nodes: No enlarged mediastinal, hilar, or axillary lymph nodes. Thyroid gland, trachea, and esophagus demonstrate no significant findings. Lungs/Pleura: Lungs are clear. No pleural effusion or pneumothorax. Musculoskeletal: Within the limitations of respiratory motion artifact, no chest wall abnormality or significant bony findings. Review of the MIP images confirms the above findings. CT ABDOMEN and PELVIS FINDINGS Hepatobiliary: No focal liver abnormality is seen. No gallstones, gallbladder wall thickening, or biliary dilatation. Pancreas: Unremarkable. No pancreatic ductal dilatation or surrounding inflammatory changes. Spleen: Normal. Adrenals/Urinary Tract: Adrenal glands are unremarkable. Within limits of motion artifact, kidneys are unremarkable without mass, calculi or hydronephrosis. Bladder is unremarkable. Stomach/Bowel: Normal stomach and duodenum. No dilated loops of small bowel. Normal appendix is visualized on coronal image 95 series 6. Large burden of stool throughout the colon. No bowel wall thickening or surrounding inflammation. Vascular/Lymphatic: Aortic atherosclerosis. No enlarged abdominal or pelvic lymph nodes. Reproductive: Prostate is unremarkable. Other: No abdominal wall hernia or abnormality. No abdominopelvic ascites. Musculoskeletal: Lower lumbar spondylosis. Degenerative changes of the left-greater-than-right SI joints. Review of the MIP images confirms the above findings. IMPRESSION: 1. No evidence of pulmonary embolism or other acute abnormality in the chest. 2. No acute abnormality in the abdomen or  pelvis. 3. Large burden of stool throughout the colon. Correlate for constipation. 4. Coronary artery disease. 5. Degenerative changes of the left-greater-than-right SI joints. Aortic Atherosclerosis (ICD10-I70.0). Electronically Signed   By: Orvan Falconer M.D.   On: 10/31/2022 13:20   CT ABDOMEN PELVIS W CONTRAST  Result Date: 10/31/2022 CLINICAL DATA:  Pulmonary embolism (PE) suspected, high prob; Abdominal pain, acute, nonlocalized. EXAM: CT ANGIOGRAPHY CHEST CT ABDOMEN AND PELVIS WITH CONTRAST TECHNIQUE: Multidetector CT imaging of the chest was performed using the standard protocol during bolus administration of intravenous contrast. Multiplanar  CT image reconstructions and MIPs were obtained to evaluate the vascular anatomy. Multidetector CT imaging of the abdomen and pelvis was performed using the standard protocol during bolus administration of intravenous contrast. RADIATION DOSE REDUCTION: This exam was performed according to the departmental dose-optimization program which includes automated exposure control, adjustment of the mA and/or kV according to patient size and/or use of iterative reconstruction technique. CONTRAST:  OMNIPAQUE IOHEXOL 350 MG/ML SOLN COMPARISON:  CT abdomen/pelvis 08/15/2022. FINDINGS: CTA CHEST FINDINGS Cardiovascular: Satisfactory opacification of the pulmonary arteries to the segmental level. No evidence of pulmonary embolism. Normal heart size. No pericardial effusion. Coronary artery calcifications. Mediastinum/Nodes: No enlarged mediastinal, hilar, or axillary lymph nodes. Thyroid gland, trachea, and esophagus demonstrate no significant findings. Lungs/Pleura: Lungs are clear. No pleural effusion or pneumothorax. Musculoskeletal: Within the limitations of respiratory motion artifact, no chest wall abnormality or significant bony findings. Review of the MIP images confirms the above findings. CT ABDOMEN and PELVIS FINDINGS Hepatobiliary: No focal liver abnormality  is seen. No gallstones, gallbladder wall thickening, or biliary dilatation. Pancreas: Unremarkable. No pancreatic ductal dilatation or surrounding inflammatory changes. Spleen: Normal. Adrenals/Urinary Tract: Adrenal glands are unremarkable. Within limits of motion artifact, kidneys are unremarkable without mass, calculi or hydronephrosis. Bladder is unremarkable. Stomach/Bowel: Normal stomach and duodenum. No dilated loops of small bowel. Normal appendix is visualized on coronal image 95 series 6. Large burden of stool throughout the colon. No bowel wall thickening or surrounding inflammation. Vascular/Lymphatic: Aortic atherosclerosis. No enlarged abdominal or pelvic lymph nodes. Reproductive: Prostate is unremarkable. Other: No abdominal wall hernia or abnormality. No abdominopelvic ascites. Musculoskeletal: Lower lumbar spondylosis. Degenerative changes of the left-greater-than-right SI joints. Review of the MIP images confirms the above findings. IMPRESSION: 1. No evidence of pulmonary embolism or other acute abnormality in the chest. 2. No acute abnormality in the abdomen or pelvis. 3. Large burden of stool throughout the colon. Correlate for constipation. 4. Coronary artery disease. 5. Degenerative changes of the left-greater-than-right SI joints. Aortic Atherosclerosis (ICD10-I70.0). Electronically Signed   By: Orvan Falconer M.D.   On: 10/31/2022 13:20    Procedures .Critical Care  Performed by: Silva Bandy, PA-C Authorized by: Silva Bandy, PA-C   Critical care provider statement:    Critical care time (minutes):  30   Critical care start time:  10/31/2022 6:00 PM   Critical care end time:  10/31/2022 6:30 PM   Critical care was necessary to treat or prevent imminent or life-threatening deterioration of the following conditions:  Dehydration   Critical care was time spent personally by me on the following activities:  Development of treatment plan with patient or surrogate, discussions with  primary provider, evaluation of patient's response to treatment, examination of patient, obtaining history from patient or surrogate, ordering and review of laboratory studies, ordering and review of radiographic studies, pulse oximetry, re-evaluation of patient's condition and review of old charts   Care discussed with: admitting provider       Medications Ordered in ED Medications  sodium chloride 0.9 % bolus 1,000 mL (1,000 mLs Intravenous New Bag/Given 10/31/22 1130)  iohexol (OMNIPAQUE) 350 MG/ML injection 100 mL (100 mLs Intravenous Contrast Given 10/31/22 1308)    ED Course/ Medical Decision Making/ A&P                             Medical Decision Making Amount and/or Complexity of Data Reviewed Labs: ordered. Radiology: ordered.  Risk Prescription drug management.  This patient is a 62 y.o. male who presents to the ED for concern of syncope, this involves an extensive number of treatment options, and is a complaint that carries with it a high risk of complications and morbidity. The emergent differential diagnosis prior to evaluation includes, but is not limited to,  CVA, ACS, arrhythmia, vasovagal syncope, orthostatic hypotension, sepsis, hypoglycemia, electrolyte disturbance, respiratory failure, symptomatic anemia, dehydration, heat injury, polypharmacy, malignancy, anxiety/panic attack.   This is not an exhaustive differential.   Past Medical History / Co-morbidities / Social History: history of diabetes, hypertension, hyperlipidemia, GERD, paranoid schizophrenia, seizures, impaired cognitive ability  Patient from assisted living facility [facility]  Additional history: Chart reviewed. Pertinent results include: Patient recently admitted in April for hypotension from hypovolemia, coffee ground emesis, illeus, constipation, aspiration, pneumonia, hyperkalemia and AKI  Physical Exam: Physical exam performed. The pertinent findings include: Patient   Lab Tests: I  ordered, and personally interpreted labs.  The pertinent results include:  hgb 10.7 improved from previous, lactic 2.6 --> 3.6 after 2,500 mls fluids   Imaging Studies: I ordered imaging studies including CTA PE, CT abdomen pelvis. I independently visualized and interpreted imaging which showed   1. No evidence of pulmonary embolism or other acute abnormality in the chest. 2. No acute abnormality in the abdomen or pelvis. 3. Large burden of stool throughout the colon. Correlate for constipation. 4. Coronary artery disease. 5. Degenerative changes of the left-greater-than-right SI joints.  I agree with the radiologist interpretation.   Cardiac Monitoring:  The patient was maintained on a cardiac monitor.  My attending physician Dr. Rodena Medin viewed and interpreted the cardiac monitored which showed an underlying rhythm of: sinus rhythm. I agree with this interpretation.   Medications: I ordered medication including fluids  for dehydration. Reevaluation of the patient after these medicines showed that the patient improved. I have reviewed the patients home medicines and have made adjustments as needed.   Disposition: After consideration of the diagnostic results and the patients response to treatment, I feel that patient will require admission for increasing lactic acidosis, syncope, and hypotension. Unclear etiology of symptoms, patient continues to be asymptomatic. Will require admission for further evaluation.   Discussed patient with hospitalist who accepts patient for admission.  I discussed this case with my attending physician Dr. Rodena Medin who cosigned this note including patient's presenting symptoms, physical exam, and planned diagnostics and interventions. Attending physician stated agreement with plan or made changes to plan which were implemented.     Final Clinical Impression(s) / ED Diagnoses Final diagnoses:  Syncope, unspecified syncope type  AKI (acute kidney injury) (HCC)   Dehydration  Hypotension, unspecified hypotension type    Rx / DC Orders ED Discharge Orders     None         Vear Clock 10/31/22 2038    Wynetta Fines, MD 10/31/22 2121

## 2022-11-01 ENCOUNTER — Observation Stay (HOSPITAL_COMMUNITY): Payer: Medicaid Other

## 2022-11-01 DIAGNOSIS — E785 Hyperlipidemia, unspecified: Secondary | ICD-10-CM | POA: Diagnosis present

## 2022-11-01 DIAGNOSIS — K219 Gastro-esophageal reflux disease without esophagitis: Secondary | ICD-10-CM | POA: Diagnosis present

## 2022-11-01 DIAGNOSIS — N179 Acute kidney failure, unspecified: Secondary | ICD-10-CM | POA: Diagnosis present

## 2022-11-01 DIAGNOSIS — Z7984 Long term (current) use of oral hypoglycemic drugs: Secondary | ICD-10-CM | POA: Diagnosis not present

## 2022-11-01 DIAGNOSIS — Z888 Allergy status to other drugs, medicaments and biological substances status: Secondary | ICD-10-CM | POA: Diagnosis not present

## 2022-11-01 DIAGNOSIS — R55 Syncope and collapse: Secondary | ICD-10-CM

## 2022-11-01 DIAGNOSIS — E861 Hypovolemia: Secondary | ICD-10-CM | POA: Diagnosis present

## 2022-11-01 DIAGNOSIS — G40909 Epilepsy, unspecified, not intractable, without status epilepticus: Secondary | ICD-10-CM | POA: Diagnosis present

## 2022-11-01 DIAGNOSIS — R6250 Unspecified lack of expected normal physiological development in childhood: Secondary | ICD-10-CM | POA: Diagnosis present

## 2022-11-01 DIAGNOSIS — E860 Dehydration: Secondary | ICD-10-CM | POA: Diagnosis present

## 2022-11-01 DIAGNOSIS — I951 Orthostatic hypotension: Secondary | ICD-10-CM | POA: Diagnosis present

## 2022-11-01 DIAGNOSIS — Z87891 Personal history of nicotine dependence: Secondary | ICD-10-CM | POA: Diagnosis not present

## 2022-11-01 DIAGNOSIS — F200 Paranoid schizophrenia: Secondary | ICD-10-CM | POA: Diagnosis present

## 2022-11-01 DIAGNOSIS — E119 Type 2 diabetes mellitus without complications: Secondary | ICD-10-CM | POA: Diagnosis present

## 2022-11-01 DIAGNOSIS — I10 Essential (primary) hypertension: Secondary | ICD-10-CM | POA: Diagnosis present

## 2022-11-01 DIAGNOSIS — Z79899 Other long term (current) drug therapy: Secondary | ICD-10-CM | POA: Diagnosis not present

## 2022-11-01 DIAGNOSIS — E872 Acidosis, unspecified: Secondary | ICD-10-CM | POA: Diagnosis present

## 2022-11-01 DIAGNOSIS — Z880 Allergy status to penicillin: Secondary | ICD-10-CM | POA: Diagnosis not present

## 2022-11-01 LAB — BASIC METABOLIC PANEL
Anion gap: 13 (ref 5–15)
BUN: 25 mg/dL — ABNORMAL HIGH (ref 8–23)
CO2: 21 mmol/L — ABNORMAL LOW (ref 22–32)
Calcium: 9.4 mg/dL (ref 8.9–10.3)
Chloride: 105 mmol/L (ref 98–111)
Creatinine, Ser: 1.19 mg/dL (ref 0.61–1.24)
GFR, Estimated: >60 mL/min (ref >60)
Glucose, Bld: 104 mg/dL — ABNORMAL HIGH (ref 70–99)
Potassium: 4.1 mmol/L (ref 3.5–5.1)
Sodium: 139 mmol/L (ref 135–145)

## 2022-11-01 LAB — ECHOCARDIOGRAM COMPLETE
AR max vel: 2.36 cm2
AV Area VTI: 2.23 cm2
AV Area mean vel: 2.25 cm2
AV Mean grad: 2.5 mmHg
AV Peak grad: 4.6 mmHg
Ao pk vel: 1.08 m/s
Area-P 1/2: 4.74 cm2
Calc EF: 55.6 %
Height: 66 in
MV VTI: 2.55 cm2
S' Lateral: 2.8 cm
Single Plane A2C EF: 57.7 %
Single Plane A4C EF: 56.1 %
Weight: 1969.6 oz

## 2022-11-01 LAB — CBC
HCT: 37.4 % — ABNORMAL LOW (ref 39.0–52.0)
Hemoglobin: 12 g/dL — ABNORMAL LOW (ref 13.0–17.0)
MCH: 27.7 pg (ref 26.0–34.0)
MCHC: 32.1 g/dL (ref 30.0–36.0)
MCV: 86.4 fL (ref 80.0–100.0)
Platelets: 231 10*3/uL (ref 150–400)
RBC: 4.33 MIL/uL (ref 4.22–5.81)
RDW: 14.1 % (ref 11.5–15.5)
WBC: 8.6 10*3/uL (ref 4.0–10.5)
nRBC: 0 % (ref 0.0–0.2)

## 2022-11-01 LAB — PHOSPHORUS: Phosphorus: 3.2 mg/dL (ref 2.5–4.6)

## 2022-11-01 LAB — HIV ANTIBODY (ROUTINE TESTING W REFLEX): HIV Screen 4th Generation wRfx: NONREACTIVE

## 2022-11-01 LAB — MAGNESIUM: Magnesium: 1.5 mg/dL — ABNORMAL LOW (ref 1.7–2.4)

## 2022-11-01 LAB — LACTIC ACID, PLASMA
Lactic Acid, Venous: 2.7 mmol/L (ref 0.5–1.9)
Lactic Acid, Venous: 1.7 mmol/L (ref 0.5–1.9)

## 2022-11-01 MED ORDER — SODIUM CHLORIDE 0.9 % IV SOLN: INTRAVENOUS | Status: DC

## 2022-11-01 NOTE — Evaluation (Signed)
Physical Therapy Evaluation Patient Details Name: Ronald Roman MRN: [id number] DOB: 25-Aug-1960 Today's Date: 11/01/2022  History of Present Illness  Pt is a 62 year old male admitted for syncope. Past medical history significant for essential hypertension, type 2 diabetes, seizure disorder, paranoid schizophrenia.  Clinical Impression  Pt presents with admitting diagnosis above. Pt is a poor historian however per H&P pt presents from ALF. Today, pt was Mod I with bed mobility and Min G/Min A with mobility with RW. Pt was somewhat unsteady with gait however no overt LOB noted. Despite this pt appears to be at or near baseline mobility. Pt has no further acute PT needs and will be signing off. Re consult PT if mobility status changes.      Recommendations for follow up therapy are one component of a multi-disciplinary discharge planning process, led by the attending physician.  Recommendations may be updated based on patient status, additional functional criteria and insurance authorization.  Follow Up Recommendations       Assistance Recommended at Discharge Frequent or constant Supervision/Assistance  Patient can return home with the following  A little help with walking and/or transfers;A little help with bathing/dressing/bathroom;Assistance with cooking/housework;Direct supervision/assist for medications management;Assist for transportation;Help with stairs or ramp for entrance;Direct supervision/assist for financial management;Assistance with feeding    Equipment Recommendations None recommended by PT  Recommendations for Other Services       Functional Status Assessment Patient has had a recent decline in their functional status and demonstrates the ability to make significant improvements in function in a reasonable and predictable amount of time.     Precautions / Restrictions Precautions Precautions: Fall Restrictions Weight Bearing Restrictions: No      Mobility  Bed  Mobility Overal bed mobility: Modified Independent             General bed mobility comments: HOB elevated    Transfers Overall transfer level: Needs assistance Equipment used: Rolling walker (2 wheels), None Transfers: Sit to/from Stand Sit to Stand: Min guard, Min assist           General transfer comment: Multiple sit to stands performed. Pt demonstrated heavy posterior lean upon standing however mostly able to self correct.    Ambulation/Gait Ambulation/Gait assistance: Min guard Gait Distance (Feet): 75 Feet Assistive device: Rolling walker (2 wheels) Gait Pattern/deviations: Trunk flexed, Drifts right/left, Staggering left, Staggering right, Scissoring, Decreased stride length, Step-through pattern Gait velocity: decreased     General Gait Details: Pt required cues for proximity and safety with RW.  Stairs            Wheelchair Mobility    Modified Rankin (Stroke Patients Only)       Balance Overall balance assessment: Mild deficits observed, not formally tested                                           Pertinent Vitals/Pain Pain Assessment Pain Assessment: No/denies pain    Home Living Family/patient expects to be discharged to:: Assisted living                 Home Equipment: Agricultural consultant (2 wheels);Cane - single point Additional Comments: Pt not a great historian. Pt reports that he lived on his own with some canes and walkers however HPI states that pt came from assisted living.    Prior Function Prior Level of Function : Patient  poor historian/Family not available             Mobility Comments: Pt reports that he walking without AD ADLs Comments: Pt reports ind     Hand Dominance   Dominant Hand: Right    Extremity/Trunk Assessment   Upper Extremity Assessment Upper Extremity Assessment: Overall WFL for tasks assessed    Lower Extremity Assessment Lower Extremity Assessment: Overall WFL for  tasks assessed    Cervical / Trunk Assessment Cervical / Trunk Assessment: Normal  Communication   Communication: Expressive difficulties (increased time to respond and pt responds with short phrases.)  Cognition Arousal/Alertness: Awake/alert Behavior During Therapy: WFL for tasks assessed/performed Overall Cognitive Status: History of cognitive impairments - at baseline                                 General Comments: Pt has parnoid schizophrenia at baseline. Able to follow commands consistently however not a great historian.        General Comments General comments (skin integrity, edema, etc.): VSS on RA    Exercises     Assessment/Plan    PT Assessment Patient does not need any further PT services  PT Problem List         PT Treatment Interventions      PT Goals (Current goals can be found in the Care Plan section)       Frequency       Co-evaluation               AM-PAC PT "6 Clicks" Mobility  Outcome Measure Help needed turning from your back to your side while in a flat bed without using bedrails?: None Help needed moving from lying on your back to sitting on the side of a flat bed without using bedrails?: None Help needed moving to and from a bed to a chair (including a wheelchair)?: A Little Help needed standing up from a chair using your arms (e.g., wheelchair or bedside chair)?: A Little Help needed to walk in hospital room?: A Little Help needed climbing 3-5 steps with a railing? : A Little 6 Click Score: 20    End of Session Equipment Utilized During Treatment: Gait belt Activity Tolerance: Patient tolerated treatment well Patient left: in bed;with call bell/phone within reach;with bed alarm set Nurse Communication: Mobility status PT Visit Diagnosis: Other abnormalities of gait and mobility (R26.89);History of falling (Z91.81)    Time: 1610-9604 PT Time Calculation (min) (ACUTE ONLY): 27 min   Charges:   PT  Evaluation $PT Eval High Complexity: 1 High PT Treatments $Gait Training: 8-22 mins        Shela Nevin, PT, DPT Acute Rehab Services [phone]   Gladys Damme 11/01/2022, 4:49 PM

## 2022-11-01 NOTE — ED Notes (Signed)
ED TO INPATIENT HANDOFF REPORT  ED Nurse Name and Phone #:  Theadora Rama [phone]  S Name/Age/Gender Ronald Roman 62 y.o. male Room/Bed: 004C/004C  Code Status   Code Status: Full Code  Home/SNF/Other Home Patient oriented to: self, place, time, and situation Is this baseline? Yes   Triage Complete: Triage complete  Chief Complaint Syncope [R55]  Triage Note Per [facility] coming [facility] for witnessed syncopal episode. Was eased down to the floor. Initially hypotensive and given 500 CC NS. Patient at baseline.    Allergies Allergies  Allergen Reactions   Cogentin [Benztropine] Other (See Comments)    Not documented on the [facility]   Penicillins Other (See Comments)    Not documented on the [facility]    Prolixin [Fluphenazine] Other (See Comments)    Not documented on the [facility]     Level of Care/Admitting Diagnosis ED Disposition     ED Disposition  Admit   Condition  --   Comment  Hospital Area: [facility] [[id number]]  Level of Care: Telemetry Cardiac [103]  May place patient in observation at [facility] or [facility] if equivalent level of care is available:: No  Covid Evaluation: Asymptomatic - no recent exposure (last 10 days) testing not required  Diagnosis: Syncope [206001]  Admitting Physician: [name] [[id number]]  Attending Physician: [name] [[id number]]          B Medical/Surgery History Past Medical History:  Diagnosis Date   Diabetes mellitus without complication (HCC)    GERD (gastroesophageal reflux disease)    Hyperlipidemia    Hypertension    Paranoid schizophrenia (HCC)    Seizures (HCC)    Sleep apnea    Uric acid nephrolithiasis    Past Surgical History:  Procedure Laterality Date   ESOPHAGOGASTRODUODENOSCOPY (EGD) WITH PROPOFOL N/A 08/21/2022   Procedure: ESOPHAGOGASTRODUODENOSCOPY (EGD) WITH PROPOFOL;  Surgeon: Beverley Fiedler, MD;  Location: [facility] ENDOSCOPY;  Service: Gastroenterology;  Laterality: N/A;      A IV Location/Drains/Wounds Patient Lines/Drains/Airways Status     Active Line/Drains/Airways     Name Placement date Placement time Site Days   Peripheral IV 10/31/22 20 G Posterior;Right Forearm 10/31/22  1129  Forearm  1            Intake/Output Last 24 hours No intake or output data in the 24 hours ending 11/01/22 0115  Labs/Imaging Results for orders placed or performed during the hospital encounter of 10/31/22 (from the past 48 hour(s))  Basic metabolic panel     Status: Abnormal   Collection Time: 10/31/22 10:55 AM  Result Value Ref Range   Sodium 138 135 - 145 mmol/L   Potassium 5.1 3.5 - 5.1 mmol/L   Chloride 107 98 - 111 mmol/L   CO2 24 22 - 32 mmol/L   Glucose, Bld 109 (H) 70 - 99 mg/dL    Comment: Glucose reference range applies only to samples taken after fasting for at least 8 hours.   BUN 32 (H) 8 - 23 mg/dL   Creatinine, Ser 4.69 (H) 0.61 - 1.24 mg/dL   Calcium 9.2 8.9 - 62.9 mg/dL   GFR, Estimated 54 (L) >60 mL/min    Comment: (NOTE) Calculated using the CKD-EPI Creatinine Equation (2021)    Anion gap 7 5 - 15    Comment: Performed at [facility], [address] [place], [name] [address]  CBC     Status: Abnormal   Collection Time: 10/31/22 10:55 AM  Result Value Ref Range   WBC 6.8 4.0 - 10.5 K/uL   RBC 3.93 (L) 4.22 - 5.81 MIL/uL   Hemoglobin 10.7 (L) 13.0 - 17.0 g/dL   HCT 16.1 (L) 09.6 - 04.5 %   MCV 86.3 80.0 - 100.0 fL   MCH 27.2 26.0 - 34.0 pg   MCHC 31.6 30.0 - 36.0 g/dL   RDW 40.9 81.1 - 91.4 %   Platelets 230 150 - 400 K/uL   nRBC 0.0 0.0 - 0.2 %    Comment: Performed at [facility], [address] [place], [name] [address]  Urinalysis, Routine w reflex microscopic -Urine, Clean Catch     Status: Abnormal   Collection Time: 10/31/22 12:30 PM  Result Value Ref Range   Color, Urine STRAW (A) YELLOW   APPearance CLEAR CLEAR   Specific Gravity, Urine 1.006 1.005 - 1.030   pH 7.0 5.0 - 8.0   Glucose, UA >=500 (A)  NEGATIVE mg/dL   Hgb urine dipstick NEGATIVE NEGATIVE   Bilirubin Urine NEGATIVE NEGATIVE   Ketones, ur NEGATIVE NEGATIVE mg/dL   Protein, ur NEGATIVE NEGATIVE mg/dL   Nitrite NEGATIVE NEGATIVE   Leukocytes,Ua NEGATIVE NEGATIVE   RBC / HPF 0-5 0 - 5 RBC/hpf   WBC, UA 0-5 0 - 5 WBC/hpf   Bacteria, UA NONE SEEN NONE SEEN   Squamous Epithelial / HPF 0-5 0 - 5 /HPF   Mucus PRESENT     Comment: Performed at [facility], [address] [place], [name] [address]  CBG monitoring, ED     Status: None   Collection Time: 10/31/22  1:17 PM  Result Value Ref Range   Glucose-Capillary 97 70 - 99 mg/dL    Comment: Glucose reference range applies only to samples taken after fasting for at least 8 hours.  Hepatic function panel     Status: Abnormal   Collection Time: 10/31/22  2:37 PM  Result Value Ref Range   Total Protein 6.3 (L) 6.5 - 8.1 g/dL   Albumin 3.2 (L) 3.5 - 5.0 g/dL   AST 15 15 - 41 U/L   ALT 9 0 - 44 U/L   Alkaline Phosphatase 57 38 - 126 U/L   Total Bilirubin <0.1 (L) 0.3 - 1.2 mg/dL   Bilirubin, Direct 0.1 0.0 - 0.2 mg/dL   Indirect Bilirubin NOT CALCULATED 0.3 - 0.9 mg/dL    Comment: Performed at [facility], [address], [place], [name] [address]  Lactic acid, plasma     Status: Abnormal   Collection Time: 10/31/22  2:37 PM  Result Value Ref Range   Lactic Acid, Venous 2.6 (HH) 0.5 - 1.9 mmol/L    Comment: CRITICAL RESULT CALLED TO, READ BACK BY AND VERIFIED WITH P,PULLIAM RN @1557  10/31/22 E,BENTON Performed at [facility], [address], [place], [name] [address]   Troponin I (High Sensitivity)     Status: None   Collection Time: 10/31/22  2:37 PM  Result Value Ref Range   Troponin I (High Sensitivity) 6 <18 ng/L    Comment: (NOTE) Elevated high sensitivity troponin I (hsTnI) values and significant  changes across serial measurements may suggest ACS but many other  chronic and acute conditions are known to elevate hsTnI results.  Refer to the  Links section for chest pain algorithms and additional  guidance. Performed at [facility], [address] [place], [name] [address]   Lactic acid, plasma  Status: Abnormal   Collection Time: 10/31/22  6:30 PM  Result Value Ref Range   Lactic Acid, Venous 3.6 (HH) 0.5 - 1.9 mmol/L    Comment: CRITICAL VALUE NOTED. VALUE IS CONSISTENT WITH PREVIOUSLY REPORTED/CALLED VALUE Performed at [facility], [address] [place], [name] [address]    CT Angio Chest PE W and/or Wo Contrast  Result Date: 10/31/2022 CLINICAL DATA:  Pulmonary embolism (PE) suspected, high prob; Abdominal pain, acute, nonlocalized. EXAM: CT ANGIOGRAPHY CHEST CT ABDOMEN AND PELVIS WITH CONTRAST TECHNIQUE: Multidetector CT imaging of the chest was performed using the standard protocol during bolus administration of intravenous contrast. Multiplanar CT image reconstructions and MIPs were obtained to evaluate the vascular anatomy. Multidetector CT imaging of the abdomen and pelvis was performed using the standard protocol during bolus administration of intravenous contrast. RADIATION DOSE REDUCTION: This exam was performed according to the departmental dose-optimization program which includes automated exposure control, adjustment of the mA and/or kV according to patient size and/or use of iterative reconstruction technique. CONTRAST:  OMNIPAQUE IOHEXOL 350 MG/ML SOLN COMPARISON:  CT abdomen/pelvis 08/15/2022. FINDINGS: CTA CHEST FINDINGS Cardiovascular: Satisfactory opacification of the pulmonary arteries to the segmental level. No evidence of pulmonary embolism. Normal heart size. No pericardial effusion. Coronary artery calcifications. Mediastinum/Nodes: No enlarged mediastinal, hilar, or axillary lymph nodes. Thyroid gland, trachea, and esophagus demonstrate no significant findings. Lungs/Pleura: Lungs are clear. No pleural effusion or pneumothorax. Musculoskeletal: Within the limitations of respiratory  motion artifact, no chest wall abnormality or significant bony findings. Review of the MIP images confirms the above findings. CT ABDOMEN and PELVIS FINDINGS Hepatobiliary: No focal liver abnormality is seen. No gallstones, gallbladder wall thickening, or biliary dilatation. Pancreas: Unremarkable. No pancreatic ductal dilatation or surrounding inflammatory changes. Spleen: Normal. Adrenals/Urinary Tract: Adrenal glands are unremarkable. Within limits of motion artifact, kidneys are unremarkable without mass, calculi or hydronephrosis. Bladder is unremarkable. Stomach/Bowel: Normal stomach and duodenum. No dilated loops of small bowel. Normal appendix is visualized on coronal image 95 series 6. Large burden of stool throughout the colon. No bowel wall thickening or surrounding inflammation. Vascular/Lymphatic: Aortic atherosclerosis. No enlarged abdominal or pelvic lymph nodes. Reproductive: Prostate is unremarkable. Other: No abdominal wall hernia or abnormality. No abdominopelvic ascites. Musculoskeletal: Lower lumbar spondylosis. Degenerative changes of the left-greater-than-right SI joints. Review of the MIP images confirms the above findings. IMPRESSION: 1. No evidence of pulmonary embolism or other acute abnormality in the chest. 2. No acute abnormality in the abdomen or pelvis. 3. Large burden of stool throughout the colon. Correlate for constipation. 4. Coronary artery disease. 5. Degenerative changes of the left-greater-than-right SI joints. Aortic Atherosclerosis (ICD10-I70.0). Electronically Signed   By: Orvan Falconer M.D.   On: 10/31/2022 13:20   CT ABDOMEN PELVIS W CONTRAST  Result Date: 10/31/2022 CLINICAL DATA:  Pulmonary embolism (PE) suspected, high prob; Abdominal pain, acute, nonlocalized. EXAM: CT ANGIOGRAPHY CHEST CT ABDOMEN AND PELVIS WITH CONTRAST TECHNIQUE: Multidetector CT imaging of the chest was performed using the standard protocol during bolus administration of intravenous contrast.  Multiplanar CT image reconstructions and MIPs were obtained to evaluate the vascular anatomy. Multidetector CT imaging of the abdomen and pelvis was performed using the standard protocol during bolus administration of intravenous contrast. RADIATION DOSE REDUCTION: This exam was performed according to the departmental dose-optimization program which includes automated exposure control, adjustment of the mA and/or kV according to patient size and/or use of iterative reconstruction technique. CONTRAST:  OMNIPAQUE IOHEXOL 350 MG/ML SOLN COMPARISON:  CT  abdomen/pelvis 08/15/2022. FINDINGS: CTA CHEST FINDINGS Cardiovascular: Satisfactory opacification of the pulmonary arteries to the segmental level. No evidence of pulmonary embolism. Normal heart size. No pericardial effusion. Coronary artery calcifications. Mediastinum/Nodes: No enlarged mediastinal, hilar, or axillary lymph nodes. Thyroid gland, trachea, and esophagus demonstrate no significant findings. Lungs/Pleura: Lungs are clear. No pleural effusion or pneumothorax. Musculoskeletal: Within the limitations of respiratory motion artifact, no chest wall abnormality or significant bony findings. Review of the MIP images confirms the above findings. CT ABDOMEN and PELVIS FINDINGS Hepatobiliary: No focal liver abnormality is seen. No gallstones, gallbladder wall thickening, or biliary dilatation. Pancreas: Unremarkable. No pancreatic ductal dilatation or surrounding inflammatory changes. Spleen: Normal. Adrenals/Urinary Tract: Adrenal glands are unremarkable. Within limits of motion artifact, kidneys are unremarkable without mass, calculi or hydronephrosis. Bladder is unremarkable. Stomach/Bowel: Normal stomach and duodenum. No dilated loops of small bowel. Normal appendix is visualized on coronal image 95 series 6. Large burden of stool throughout the colon. No bowel wall thickening or surrounding inflammation. Vascular/Lymphatic: Aortic atherosclerosis. No  enlarged abdominal or pelvic lymph nodes. Reproductive: Prostate is unremarkable. Other: No abdominal wall hernia or abnormality. No abdominopelvic ascites. Musculoskeletal: Lower lumbar spondylosis. Degenerative changes of the left-greater-than-right SI joints. Review of the MIP images confirms the above findings. IMPRESSION: 1. No evidence of pulmonary embolism or other acute abnormality in the chest. 2. No acute abnormality in the abdomen or pelvis. 3. Large burden of stool throughout the colon. Correlate for constipation. 4. Coronary artery disease. 5. Degenerative changes of the left-greater-than-right SI joints. Aortic Atherosclerosis (ICD10-I70.0). Electronically Signed   By: Orvan Falconer M.D.   On: 10/31/2022 13:20    Pending Labs Unresulted Labs (From admission, onward)     Start     Ordered   11/07/22 0500  Creatinine, serum  (enoxaparin (LOVENOX)    CrCl >/= 30 ml/min)  Weekly,   R     Comments: while on enoxaparin therapy    10/31/22 2153   11/01/22 0500  Basic metabolic panel  Tomorrow morning,   R        10/31/22 2153   11/01/22 0500  Magnesium  Tomorrow morning,   R        10/31/22 2153   11/01/22 0500  Phosphorus  Tomorrow morning,   R        10/31/22 2153   10/31/22 2154  Lactic acid, plasma  STAT Now then every 3 hours,   R      10/31/22 2154   10/31/22 2153  HIV Antibody (routine testing w rflx)  (HIV Antibody (Routine testing w reflex) panel)  Once,   R        10/31/22 2153   10/31/22 2153  CBC  (enoxaparin (LOVENOX)    CrCl >/= 30 ml/min)  Once,   R       Comments: Baseline for enoxaparin therapy IF NOT ALREADY DRAWN.  Notify MD if PLT < 100 K.    10/31/22 2153   10/31/22 2153  Creatinine, serum  (enoxaparin (LOVENOX)    CrCl >/= 30 ml/min)  Once,   R       Comments: Baseline for enoxaparin therapy IF NOT ALREADY DRAWN.    10/31/22 2153            Vitals/Pain Today's Vitals   10/31/22 1802 10/31/22 1806 10/31/22 2328 10/31/22 2358  BP: 122/86   (!) 127/91   Pulse: 85 (!) 55  66  Resp: 16 15  16   Temp:  98.2 F (  36.8 C) 98 F (36.7 C)   TempSrc:   Oral   SpO2: 95% 95%  99%  Weight:      Height:        Isolation Precautions No active isolations  Medications Medications  enoxaparin (LOVENOX) injection 40 mg (40 mg Subcutaneous Given 10/31/22 2156)  0.9 %  sodium chloride infusion ( Intravenous New Bag/Given 11/01/22 0047)  amantadine (SYMMETREL) capsule 100 mg (100 mg Oral Given 10/31/22 2330)  benztropine (COGENTIN) tablet 1 mg (1 mg Oral Given 10/31/22 2330)  loratadine (CLARITIN) tablet 10 mg (has no administration in time range)  divalproex (DEPAKOTE ER) 24 hr tablet 500 mg (500 mg Oral Given 10/31/22 2330)  hydrOXYzine (ATARAX) tablet 25 mg (has no administration in time range)  levETIRAcetam (KEPPRA) tablet 500 mg (500 mg Oral Given 10/31/22 2330)  lisinopril (ZESTRIL) tablet 10 mg (has no administration in time range)  QUEtiapine (SEROQUEL) tablet 100 mg (100 mg Oral Given 10/31/22 2330)  acetaminophen (TYLENOL) tablet 650 mg (has no administration in time range)  polyethylene glycol (MIRALAX / GLYCOLAX) packet 17 g (has no administration in time range)  traZODone (DESYREL) tablet 50 mg (50 mg Oral Given 10/31/22 2330)  ondansetron (ZOFRAN) injection 4 mg (has no administration in time range)  sodium chloride 0.9 % bolus 1,000 mL (0 mLs Intravenous Stopped 10/31/22 1332)  iohexol (OMNIPAQUE) 350 MG/ML injection 100 mL (100 mLs Intravenous Contrast Given 10/31/22 1308)  lactated ringers bolus 1,000 mL (0 mLs Intravenous Stopped 10/31/22 1822)    Mobility walks     Focused Assessments Neuro Assessment Handoff:  Swallow screen pass? Yes          Neuro Assessment: Exceptions to WDL (reported syncope with LOC) Neuro Checks:      Has TPA been given? No If patient is a Neuro Trauma and patient is going to OR before floor call report to 4N Charge nurse: [phone] or [phone]   R Recommendations: See Admitting Provider  Note  Report given to:   Additional Notes:

## 2022-11-01 NOTE — Progress Notes (Signed)
PROGRESS NOTE    HINTON COTA  JXB:[id number] DOB: 1961-05-17 DOA: 10/31/2022 PCP: [facility]  Outpatient Specialists:     Brief Narrative:  Patient is a 62 year old African-American male with past medical history significant for essential hypertension, diabetes mellitus type 2, seizure and paranoid schizophrenia.  Patient was admitted with orthostatic syncope, hypotension and acute kidney injury.  Patient was also volume depleted on presentation.  Volume depletion is improving.  AKI is improving.  Will continue IV fluids.  PT OT input.  Likely discharge in the next 1 to 2 days.   Assessment & Plan:   Principal Problem:   Syncope  Syncope: -Etiology is multifactorial. -Patient was hypotensive, volume depleted and was orthostatic. -Patient remains orthostatic. -Continue IV fluid. -Volume depletion is improving. Acute kidney injury is improving. Blood pressure is improving. PT OT input. Likely discharge in the next 1 to 2 days. -Continue to follow lactic acid level.     Lactic acidosis Likely multifactorial. -Lactic acid is trending downwards. -Last lactic acid level was 1.7 (normal).     Seizure disorder No reported seizure-like activities Resume home AEDs Seizure precautions   History of paranoid schizophrenia Resume home regimen.   DVT prophylaxis: Subcutaneous Lovenox Code Status: Full code Family Communication:  Disposition Plan: Likely discharge back in the next 1 to 2 days.   Consultants:  None.  Procedures:  None.  Antimicrobials:  None.   Subjective: Patient is a poor historian.  Objective: Vitals:   11/01/22 0206 11/01/22 0748 11/01/22 0955 11/01/22 1205  BP: 113/85 109/74 121/76 (!) 113/95  Pulse: 79 77  78  Resp:  18  16  Temp: 98.5 F (36.9 C) 98.2 F (36.8 C)  98.3 F (36.8 C)  TempSrc: Oral Oral  Oral  SpO2: 95%     Weight: 55.8 kg     Height: 5\' 6"  (1.676 m)       Intake/Output Summary (Last 24 hours) at  11/01/2022 1305 Last data filed at 11/01/2022 0700 Gross per 24 hour  Intake 1098.67 ml  Output --  Net 1098.67 ml   Filed Weights   10/31/22 1100 11/01/22 0206  Weight: 69.9 kg 55.8 kg    Examination:  General exam: Appears calm and comfortable.  Dry buccal mucosa. Respiratory system: Clear to auscultation. Cardiovascular system: S1 & S2 heard Gastrointestinal system: Abdomen is nondistended, soft and nontender. No organomegaly or masses felt. Normal bowel sounds heard. Central nervous system: Awake and alert.  Patient moves all extremities.   Extremities: No leg edema.  Data Reviewed: I have personally reviewed following labs and imaging studies  CBC: Recent Labs  Lab 10/31/22 1055 11/01/22 0349  WBC 6.8 8.6  HGB 10.7* 12.0*  HCT 33.9* 37.4*  MCV 86.3 86.4  PLT 230 231   Basic Metabolic Panel: Recent Labs  Lab 10/31/22 1055 11/01/22 0350  NA 138 139  K 5.1 4.1  CL 107 105  CO2 24 21*  GLUCOSE 109* 104*  BUN 32* 25*  CREATININE 1.47* 1.19  CALCIUM 9.2 9.4  MG  --  1.5*  PHOS  --  3.2   GFR: Estimated Creatinine Clearance: 51.4 mL/min (by C-G formula based on SCr of 1.19 mg/dL). Liver Function Tests: Recent Labs  Lab 10/31/22 1437  AST 15  ALT 9  ALKPHOS 57  BILITOT <0.1*  PROT 6.3*  ALBUMIN 3.2*   No results for input(s): "LIPASE", "AMYLASE" in the last 168 hours. No results for input(s): "AMMONIA" in the last 168 hours.  Coagulation Profile: No results for input(s): "INR", "PROTIME" in the last 168 hours. Cardiac Enzymes: No results for input(s): "CKTOTAL", "CKMB", "CKMBINDEX", "TROPONINI" in the last 168 hours. BNP (last 3 results) No results for input(s): "PROBNP" in the last 8760 hours. HbA1C: No results for input(s): "HGBA1C" in the last 72 hours. CBG: Recent Labs  Lab 10/31/22 1317  GLUCAP 97   Lipid Profile: No results for input(s): "CHOL", "HDL", "LDLCALC", "TRIG", "CHOLHDL", "LDLDIRECT" in the last 72 hours. Thyroid Function  Tests: No results for input(s): "TSH", "T4TOTAL", "FREET4", "T3FREE", "THYROIDAB" in the last 72 hours. Anemia Panel: No results for input(s): "VITAMINB12", "FOLATE", "FERRITIN", "TIBC", "IRON", "RETICCTPCT" in the last 72 hours. Urine analysis:    Component Value Date/Time   COLORURINE STRAW (A) 10/31/2022 1230   APPEARANCEUR CLEAR 10/31/2022 1230   LABSPEC 1.006 10/31/2022 1230   PHURINE 7.0 10/31/2022 1230   GLUCOSEU >=500 (A) 10/31/2022 1230   HGBUR NEGATIVE 10/31/2022 1230   BILIRUBINUR NEGATIVE 10/31/2022 1230   KETONESUR NEGATIVE 10/31/2022 1230   PROTEINUR NEGATIVE 10/31/2022 1230   UROBILINOGEN 0.2 02/04/2015 0059   NITRITE NEGATIVE 10/31/2022 1230   LEUKOCYTESUR NEGATIVE 10/31/2022 1230   Sepsis Labs: @LABRCNTIP (procalcitonin:4,lacticidven:4)  )No results found for this or any previous visit (from the past 240 hour(s)).       Radiology Studies: CT Angio Chest PE W and/or Wo Contrast  Result Date: 10/31/2022 CLINICAL DATA:  Pulmonary embolism (PE) suspected, high prob; Abdominal pain, acute, nonlocalized. EXAM: CT ANGIOGRAPHY CHEST CT ABDOMEN AND PELVIS WITH CONTRAST TECHNIQUE: Multidetector CT imaging of the chest was performed using the standard protocol during bolus administration of intravenous contrast. Multiplanar CT image reconstructions and MIPs were obtained to evaluate the vascular anatomy. Multidetector CT imaging of the abdomen and pelvis was performed using the standard protocol during bolus administration of intravenous contrast. RADIATION DOSE REDUCTION: This exam was performed according to the departmental dose-optimization program which includes automated exposure control, adjustment of the mA and/or kV according to patient size and/or use of iterative reconstruction technique. CONTRAST:  OMNIPAQUE IOHEXOL 350 MG/ML SOLN COMPARISON:  CT abdomen/pelvis 08/15/2022. FINDINGS: CTA CHEST FINDINGS Cardiovascular: Satisfactory opacification of the pulmonary  arteries to the segmental level. No evidence of pulmonary embolism. Normal heart size. No pericardial effusion. Coronary artery calcifications. Mediastinum/Nodes: No enlarged mediastinal, hilar, or axillary lymph nodes. Thyroid gland, trachea, and esophagus demonstrate no significant findings. Lungs/Pleura: Lungs are clear. No pleural effusion or pneumothorax. Musculoskeletal: Within the limitations of respiratory motion artifact, no chest wall abnormality or significant bony findings. Review of the MIP images confirms the above findings. CT ABDOMEN and PELVIS FINDINGS Hepatobiliary: No focal liver abnormality is seen. No gallstones, gallbladder wall thickening, or biliary dilatation. Pancreas: Unremarkable. No pancreatic ductal dilatation or surrounding inflammatory changes. Spleen: Normal. Adrenals/Urinary Tract: Adrenal glands are unremarkable. Within limits of motion artifact, kidneys are unremarkable without mass, calculi or hydronephrosis. Bladder is unremarkable. Stomach/Bowel: Normal stomach and duodenum. No dilated loops of small bowel. Normal appendix is visualized on coronal image 95 series 6. Large burden of stool throughout the colon. No bowel wall thickening or surrounding inflammation. Vascular/Lymphatic: Aortic atherosclerosis. No enlarged abdominal or pelvic lymph nodes. Reproductive: Prostate is unremarkable. Other: No abdominal wall hernia or abnormality. No abdominopelvic ascites. Musculoskeletal: Lower lumbar spondylosis. Degenerative changes of the left-greater-than-right SI joints. Review of the MIP images confirms the above findings. IMPRESSION: 1. No evidence of pulmonary embolism or other acute abnormality in the chest. 2. No acute abnormality in the abdomen or pelvis. 3.  Large burden of stool throughout the colon. Correlate for constipation. 4. Coronary artery disease. 5. Degenerative changes of the left-greater-than-right SI joints. Aortic Atherosclerosis (ICD10-I70.0). Electronically  Signed   By: Orvan Falconer M.D.   On: 10/31/2022 13:20   CT ABDOMEN PELVIS W CONTRAST  Result Date: 10/31/2022 CLINICAL DATA:  Pulmonary embolism (PE) suspected, high prob; Abdominal pain, acute, nonlocalized. EXAM: CT ANGIOGRAPHY CHEST CT ABDOMEN AND PELVIS WITH CONTRAST TECHNIQUE: Multidetector CT imaging of the chest was performed using the standard protocol during bolus administration of intravenous contrast. Multiplanar CT image reconstructions and MIPs were obtained to evaluate the vascular anatomy. Multidetector CT imaging of the abdomen and pelvis was performed using the standard protocol during bolus administration of intravenous contrast. RADIATION DOSE REDUCTION: This exam was performed according to the departmental dose-optimization program which includes automated exposure control, adjustment of the mA and/or kV according to patient size and/or use of iterative reconstruction technique. CONTRAST:  OMNIPAQUE IOHEXOL 350 MG/ML SOLN COMPARISON:  CT abdomen/pelvis 08/15/2022. FINDINGS: CTA CHEST FINDINGS Cardiovascular: Satisfactory opacification of the pulmonary arteries to the segmental level. No evidence of pulmonary embolism. Normal heart size. No pericardial effusion. Coronary artery calcifications. Mediastinum/Nodes: No enlarged mediastinal, hilar, or axillary lymph nodes. Thyroid gland, trachea, and esophagus demonstrate no significant findings. Lungs/Pleura: Lungs are clear. No pleural effusion or pneumothorax. Musculoskeletal: Within the limitations of respiratory motion artifact, no chest wall abnormality or significant bony findings. Review of the MIP images confirms the above findings. CT ABDOMEN and PELVIS FINDINGS Hepatobiliary: No focal liver abnormality is seen. No gallstones, gallbladder wall thickening, or biliary dilatation. Pancreas: Unremarkable. No pancreatic ductal dilatation or surrounding inflammatory changes. Spleen: Normal. Adrenals/Urinary Tract: Adrenal glands are  unremarkable. Within limits of motion artifact, kidneys are unremarkable without mass, calculi or hydronephrosis. Bladder is unremarkable. Stomach/Bowel: Normal stomach and duodenum. No dilated loops of small bowel. Normal appendix is visualized on coronal image 95 series 6. Large burden of stool throughout the colon. No bowel wall thickening or surrounding inflammation. Vascular/Lymphatic: Aortic atherosclerosis. No enlarged abdominal or pelvic lymph nodes. Reproductive: Prostate is unremarkable. Other: No abdominal wall hernia or abnormality. No abdominopelvic ascites. Musculoskeletal: Lower lumbar spondylosis. Degenerative changes of the left-greater-than-right SI joints. Review of the MIP images confirms the above findings. IMPRESSION: 1. No evidence of pulmonary embolism or other acute abnormality in the chest. 2. No acute abnormality in the abdomen or pelvis. 3. Large burden of stool throughout the colon. Correlate for constipation. 4. Coronary artery disease. 5. Degenerative changes of the left-greater-than-right SI joints. Aortic Atherosclerosis (ICD10-I70.0). Electronically Signed   By: Orvan Falconer M.D.   On: 10/31/2022 13:20        Scheduled Meds:  amantadine  100 mg Oral BID   benztropine  1 mg Oral BID   divalproex  500 mg Oral BID   enoxaparin (LOVENOX) injection  40 mg Subcutaneous Q24H   levETIRAcetam  500 mg Oral BID   lisinopril  10 mg Oral Daily   loratadine  10 mg Oral Daily   QUEtiapine  100 mg Oral QHS   traZODone  50 mg Oral QHS   Continuous Infusions:  sodium chloride 100 mL/hr at 11/01/22 1000   sodium chloride       LOS: 0 days    Time spent: 35 minutes.    Berton Mount, MD  [facility] Pager #: [phone] 7PM-7AM contact night coverage as above

## 2022-11-01 NOTE — TOC Initial Note (Signed)
Transition of Care [facility]) - Initial/Assessment Note    Patient Details  Name: Ronald Roman MRN: [id number] Date of Birth: Dec 14, 1960  [facility]) CM/SW Contact:    Delilah Shan, LCSWA Phone Number: 11/01/2022, 4:17 PM  Clinical Narrative:                  CSW spoke with patient at bedside who gave CSW permission to call his Legal Guardian Jewel regarding his dc plan. CSW spoke with patients legal guardian Jewel who confirmed PTA patient comes from [facility]. Jewel confirmed patient will transport by ALF when medically ready for dc. All questions answered. No further questions reported at this time. CSW called [facility] and spoke with Lurena Joiner who confirmed patient comes from [facility]. Lurena Joiner informed CSW to send FL2,Dc summary over to fax # [phone] when patient is medically ready for discharge. CSW will continue to follow and assist with patients dc planning needs.  Expected Discharge Plan: Assisted Living Barriers to Discharge: Continued Medical Work up   Patient Goals and CMS Choice Patient states their goals for this hospitalization and ongoing recovery are:: to return to ALF   Choice offered to / list presented to : Patient      Expected Discharge Plan and Services In-house Referral: Clinical Social Work     Living arrangements for the past 2 months: Assisted Living Facility (From [facility])                                      Prior Living Arrangements/Services Living arrangements for the past 2 months: Assisted Living Facility (From [facility]) Lives with:: Facility Resident Patient language and need for interpreter reviewed:: Yes Do you feel safe going back to the place where you live?: Yes      Need for Family Participation in Patient Care: Yes (Comment) Care giver support system in place?: Yes (comment)   Criminal Activity/Legal Involvement Pertinent to Current Situation/Hospitalization: No - Comment  as needed  Activities of Daily Living      Permission Sought/Granted Permission sought to share information with : Case Manager, Magazine features editor, Family Supports Permission granted to share information with : Yes, Verbal Permission Granted  Share Information with NAME: Jewel  Permission granted to share info w AGENCY: [facility]  Permission granted to share info w Relationship: Legal Guardian  Permission granted to share info w Contact Information: Jewel [phone]  Emotional Assessment Appearance:: Appears stated age Attitude/Demeanor/Rapport: Gracious Affect (typically observed): Calm Orientation: : Oriented to Self, Oriented to Place, Oriented to  Time, Oriented to Situation Alcohol / Substance Use: Not Applicable Psych Involvement: No (comment)  Admission diagnosis:  Dehydration [E86.0] Syncope [R55] AKI (acute kidney injury) (HCC) [N17.9] Hypotension, unspecified hypotension type [I95.9] Syncope, unspecified syncope type [R55] Patient Active Problem List   Diagnosis Date Noted   Syncope 10/31/2022   Gastroesophageal reflux disease with esophagitis without hemorrhage 08/21/2022   Abnormal CT scan, gastrointestinal tract 08/16/2022   Constipation 08/16/2022   Coffee ground emesis 08/15/2022   Insomnia due to other mental disorder 11/18/2020   Episode of recurrent major depressive disorder (HCC) 11/18/2020   Acute metabolic encephalopathy 10/13/2020   Impaired cognitive ability    Aggressive behavior    Seizure (HCC) 05/07/2020   AMS (altered mental status) 02/09/2020   Schizophrenia (HCC)    Leukocytosis 02/06/2015   Sleep apnea 02/05/2015  AKI (acute kidney injury) (HCC) 02/04/2015   Seizure disorder (HCC) 02/04/2015   Acute encephalopathy 02/04/2015   Diabetes mellitus without complication (HCC)    Hyperlipidemia    Hypertension    Paranoid schizophrenia [facility])    PCP:  [facility]:   [facility] [place], [name] - [address] [name] [address] Phone: [phone] Fax: [phone]     Social Determinants of Health (SDOH) Social History: SDOH Screenings   Depression (PHQ2-9): Medium Risk (04/29/2021)  Tobacco Use: Medium Risk (10/31/2022)   SDOH Interventions:     Readmission Risk Interventions     No data to display

## 2022-11-02 DIAGNOSIS — R55 Syncope and collapse: Secondary | ICD-10-CM | POA: Diagnosis not present

## 2022-11-02 NOTE — Plan of Care (Signed)
  Problem: Education: Goal: Knowledge of General Education information will improve Description: Including pain rating scale, medication(s)/side effects and non-pharmacologic comfort measures 11/02/2022 0950 by Royetta Crochet, RN Outcome: Progressing 11/02/2022 0906 by Royetta Crochet, RN Outcome: Progressing   Problem: Health Behavior/Discharge Planning: Goal: Ability to manage health-related needs will improve 11/02/2022 0950 by Royetta Crochet, RN Outcome: Progressing 11/02/2022 0906 by Royetta Crochet, RN Outcome: Progressing   Problem: Clinical Measurements: Goal: Ability to maintain clinical measurements within normal limits will improve 11/02/2022 0950 by Royetta Crochet, RN Outcome: Progressing 11/02/2022 0906 by Royetta Crochet, RN Outcome: Progressing Goal: Will remain free from infection 11/02/2022 0950 by Royetta Crochet, RN Outcome: Progressing 11/02/2022 0906 by Royetta Crochet, RN Outcome: Progressing Goal: Diagnostic test results will improve 11/02/2022 0950 by Royetta Crochet, RN Outcome: Progressing 11/02/2022 0906 by Royetta Crochet, RN Outcome: Progressing Goal: Respiratory complications will improve 11/02/2022 0950 by Royetta Crochet, RN Outcome: Progressing 11/02/2022 0906 by Royetta Crochet, RN Outcome: Progressing Goal: Cardiovascular complication will be avoided 11/02/2022 0950 by Royetta Crochet, RN Outcome: Progressing 11/02/2022 0906 by Royetta Crochet, RN Outcome: Progressing   Problem: Activity: Goal: Risk for activity intolerance will decrease 11/02/2022 0950 by Royetta Crochet, RN Outcome: Progressing 11/02/2022 0906 by Royetta Crochet, RN Outcome: Progressing   Problem: Nutrition: Goal: Adequate nutrition will be maintained 11/02/2022 0950 by Royetta Crochet, RN Outcome: Progressing 11/02/2022 0906 by Royetta Crochet, RN Outcome: Progressing   Problem: Coping: Goal: Level of anxiety will decrease 11/02/2022 0950 by Royetta Crochet, RN Outcome: Progressing 11/02/2022 0906 by Royetta Crochet, RN Outcome: Progressing   Problem: Elimination: Goal: Will not experience complications related to bowel motility 11/02/2022 0950 by Royetta Crochet, RN Outcome: Progressing 11/02/2022 0906 by Royetta Crochet, RN Outcome: Progressing Goal: Will not experience complications related to urinary retention 11/02/2022 0950 by Royetta Crochet, RN Outcome: Progressing 11/02/2022 0906 by Royetta Crochet, RN Outcome: Progressing   Problem: Pain Managment: Goal: General experience of comfort will improve 11/02/2022 0950 by Royetta Crochet, RN Outcome: Progressing 11/02/2022 0906 by Royetta Crochet, RN Outcome: Progressing   Problem: Safety: Goal: Ability to remain free from injury will improve 11/02/2022 0950 by Royetta Crochet, RN Outcome: Progressing 11/02/2022 0906 by Royetta Crochet, RN Outcome: Progressing   Problem: Skin Integrity: Goal: Risk for impaired skin integrity will decrease 11/02/2022 0950 by Royetta Crochet, RN Outcome: Progressing 11/02/2022 0906 by Royetta Crochet, RN Outcome: Progressing

## 2022-11-02 NOTE — TOC Progression Note (Signed)
Transition of Care [facility]) - Progression Note    Patient Details  Name: Ronald Roman MRN: [id number] Date of Birth: May 28, 1960  Transition [facility] Care [facility]) CM/SW Contact  Delilah Shan, LCSWA Phone Number: 11/02/2022, 3:36 PM  Clinical Narrative:     Update- CSW spoke with Lurena Joiner with ALF who confirmed patient can dc over today if medically ready. CSW informed MD. Lurena Joiner confirmed ALF can provide transportation for patient when medically ready for dc. Plan for patient to return to [facility] when medically ready for dc. CSW will continue to follow and assist with patients dc planning needs.  Expected Discharge Plan: Assisted Living Barriers to Discharge: Continued Medical Work up  Expected Discharge Plan and Services In-house Referral: Clinical Social Work     Living arrangements for the past 2 months: Assisted Living Facility (From [facility])                                       Social Determinants of Health (SDOH) Interventions SDOH Screenings   Depression (PHQ2-9): Medium Risk (04/29/2021)  Tobacco Use: Medium Risk (10/31/2022)    Readmission Risk Interventions     No data to display

## 2022-11-02 NOTE — Plan of Care (Signed)

## 2022-11-02 NOTE — Progress Notes (Signed)
PROGRESS NOTE    Ronald Roman  UJW:[id number] DOB: Jan 19, 1961 DOA: 10/31/2022 PCP: [facility]  Outpatient Specialists:     Brief Narrative:  Patient is a 62 year old African-American male with past medical history significant for essential hypertension, diabetes mellitus type 2, seizure and paranoid schizophrenia.  Patient was admitted with orthostatic syncope, hypotension and acute kidney injury.  Patient was also volume depleted on presentation.  Volume depletion is improving.  AKI is improving.  Will continue IV fluids.  PT OT input.  Likely discharge in the next 1 to 2 days.  11/02/2022: Patient remains orthostatic.  Foul-smelling urine noted.  Will proceed urinalysis and if indicated, will proceed with urine culture.  Renal panel in the morning.   Assessment & Plan:   Principal Problem:   Syncope  Syncope: -Etiology is likely multifactorial. -Patient was hypotensive, volume depleted and was orthostatic. -Patient remains orthostatic. -Continue IV fluid. -Volume depletion is improving. Acute kidney injury is improving. Blood pressure is improving. PT OT input. Likely discharge in the next 1 to 2 days. -Continue to follow lactic acid level.     Lactic acidosis Likely multifactorial. -Lactic acid is trending downwards. -Last lactic acid level was 1.7 (normal).   -Repeat urinalysis.   Seizure disorder No reported seizure-like activities Resume home AEDs Seizure precautions   History of paranoid schizophrenia Resume home regimen.  Acute kidney injury: -Baseline serum creatinine of 0.91. -On presentation, serum creatinine was 1.47. -AKI is likely secondary to volume depletion. -Continue to monitor renal function and electrolytes. -Avoid nephrotoxins. -Keep MAP greater than 65 mmHg.   DVT prophylaxis: Subcutaneous Lovenox Code Status: Full code Family Communication:  Disposition Plan: Likely discharge back in the next 1 to 2  days.   Consultants:  None.  Procedures:  None.  Antimicrobials:  None.   Subjective: Patient is a poor historian.  Objective: Vitals:   11/01/22 2102 11/02/22 0323 11/02/22 0748 11/02/22 0800  BP: 120/76 136/85 (!) 140/88   Pulse: 81 60 79   Resp: 16 18    Temp: 98.4 F (36.9 C) 98.6 F (37 C)    TempSrc: Oral Oral    SpO2:  100%  100%  Weight:      Height:        Intake/Output Summary (Last 24 hours) at 11/02/2022 1516 Last data filed at 11/02/2022 0400 Gross per 24 hour  Intake 2420.08 ml  Output --  Net 2420.08 ml    Filed Weights   10/31/22 1100 11/01/22 0206  Weight: 69.9 kg 55.8 kg    Examination:  General exam: Appears calm and comfortable.  Dry buccal mucosa. Respiratory system: Clear to auscultation. Cardiovascular system: S1 & S2 heard Gastrointestinal system: Abdomen is nondistended, soft and nontender. No organomegaly or masses felt. Normal bowel sounds heard. Central nervous system: Awake and alert.  Patient moves all extremities.   Extremities: No leg edema.  Data Reviewed: I have personally reviewed following labs and imaging studies  CBC: Recent Labs  Lab 10/31/22 1055 11/01/22 0349  WBC 6.8 8.6  HGB 10.7* 12.0*  HCT 33.9* 37.4*  MCV 86.3 86.4  PLT 230 231    Basic Metabolic Panel: Recent Labs  Lab 10/31/22 1055 11/01/22 0350  NA 138 139  K 5.1 4.1  CL 107 105  CO2 24 21*  GLUCOSE 109* 104*  BUN 32* 25*  CREATININE 1.47* 1.19  CALCIUM 9.2 9.4  MG  --  1.5*  PHOS  --  3.2    GFR: Estimated  Creatinine Clearance: 51.4 mL/min (by C-G formula based on SCr of 1.19 mg/dL). Liver Function Tests: Recent Labs  Lab 10/31/22 1437  AST 15  ALT 9  ALKPHOS 57  BILITOT <0.1*  PROT 6.3*  ALBUMIN 3.2*    No results for input(s): "LIPASE", "AMYLASE" in the last 168 hours. No results for input(s): "AMMONIA" in the last 168 hours. Coagulation Profile: No results for input(s): "INR", "PROTIME" in the last 168  hours. Cardiac Enzymes: No results for input(s): "CKTOTAL", "CKMB", "CKMBINDEX", "TROPONINI" in the last 168 hours. BNP (last 3 results) No results for input(s): "PROBNP" in the last 8760 hours. HbA1C: No results for input(s): "HGBA1C" in the last 72 hours. CBG: Recent Labs  Lab 10/31/22 1317  GLUCAP 97    Lipid Profile: No results for input(s): "CHOL", "HDL", "LDLCALC", "TRIG", "CHOLHDL", "LDLDIRECT" in the last 72 hours. Thyroid Function Tests: No results for input(s): "TSH", "T4TOTAL", "FREET4", "T3FREE", "THYROIDAB" in the last 72 hours. Anemia Panel: No results for input(s): "VITAMINB12", "FOLATE", "FERRITIN", "TIBC", "IRON", "RETICCTPCT" in the last 72 hours. Urine analysis:    Component Value Date/Time   COLORURINE STRAW (A) 10/31/2022 1230   APPEARANCEUR CLEAR 10/31/2022 1230   LABSPEC 1.006 10/31/2022 1230   PHURINE 7.0 10/31/2022 1230   GLUCOSEU >=500 (A) 10/31/2022 1230   HGBUR NEGATIVE 10/31/2022 1230   BILIRUBINUR NEGATIVE 10/31/2022 1230   KETONESUR NEGATIVE 10/31/2022 1230   PROTEINUR NEGATIVE 10/31/2022 1230   UROBILINOGEN 0.2 02/04/2015 0059   NITRITE NEGATIVE 10/31/2022 1230   LEUKOCYTESUR NEGATIVE 10/31/2022 1230   Sepsis Labs: @LABRCNTIP (procalcitonin:4,lacticidven:4)  )No results found for this or any previous visit (from the past 240 hour(s)).       Radiology Studies: ECHOCARDIOGRAM COMPLETE  Result Date: 11/01/2022    ECHOCARDIOGRAM REPORT   Patient Name:   EIJI BODLE Date of Exam: 11/01/2022 Medical Rec #:  [id number]        Height:       66.0 in Accession #:    [id number]       Weight:       123.1 lb Date of Birth:  [birth year]        BSA:          1.627 m Patient Age:    61 years         BP:           113/95 mmHg Patient Gender: M                HR:           71 bpm. Exam Location:  Inpatient Procedure: 2D Echo, Cardiac Doppler and Color Doppler Indications:    Syncope  History:        Patient has no prior history of Echocardiogram  examinations.                 Seizure disorder, Signs/Symptoms:Syncope; Risk Factors:Diabetes,                 Dyslipidemia, Hypertension, Sleep Apnea and Former Smoker.  Sonographer:    Wallie Char Referring Phys: [id number] CAROLE N HALL IMPRESSIONS  1. Left ventricular ejection fraction, by estimation, is 50 to 55%. The left ventricle has low normal function. The left ventricle has no regional wall motion abnormalities. Left ventricular diastolic parameters are consistent with Grade I diastolic dysfunction (impaired relaxation).  2. Right ventricular systolic function is normal. The right ventricular size is mildly enlarged.  3. The mitral valve is normal in structure. Trivial  mitral valve regurgitation.  4. The aortic valve is tricuspid. Aortic valve regurgitation is not visualized. Aortic valve sclerosis is present, with no evidence of aortic valve stenosis. Comparison(s): No prior Echocardiogram. FINDINGS  Left Ventricle: Left ventricular ejection fraction, by estimation, is 50 to 55%. The left ventricle has low normal function. The left ventricle has no regional wall motion abnormalities. The left ventricular internal cavity size was normal in size. There is no left ventricular hypertrophy. Left ventricular diastolic parameters are consistent with Grade I diastolic dysfunction (impaired relaxation). Right Ventricle: The right ventricular size is mildly enlarged. No increase in right ventricular wall thickness. Right ventricular systolic function is normal. Left Atrium: Left atrial size was normal in size. Right Atrium: Right atrial size was normal in size. Pericardium: There is no evidence of pericardial effusion. Mitral Valve: The mitral valve is normal in structure. Trivial mitral valve regurgitation. MV peak gradient, 2.4 mmHg. The mean mitral valve gradient is 1.0 mmHg. Tricuspid Valve: The tricuspid valve is normal in structure. Tricuspid valve regurgitation is trivial. Aortic Valve: The aortic valve is  tricuspid. Aortic valve regurgitation is not visualized. Aortic valve sclerosis is present, with no evidence of aortic valve stenosis. Aortic valve mean gradient measures 2.5 mmHg. Aortic valve peak gradient measures 4.6  mmHg. Aortic valve area, by VTI measures 2.23 cm. Pulmonic Valve: The pulmonic valve was normal in structure. Pulmonic valve regurgitation is trivial. Aorta: The aortic root is normal in size and structure. IAS/Shunts: The atrial septum is grossly normal.  LEFT VENTRICLE PLAX 2D LVIDd:         4.00 cm     Diastology LVIDs:         2.80 cm     LV e' medial:    9.89 cm/s LV PW:         1.00 cm     LV E/e' medial:  6.5 LV IVS:        1.00 cm     LV e' lateral:   13.70 cm/s LVOT diam:     1.90 cm     LV E/e' lateral: 4.7 LV SV:         49 LV SV Index:   30 LVOT Area:     2.84 cm  LV Volumes (MOD) LV vol d, MOD A2C: 79.5 ml LV vol d, MOD A4C: 90.9 ml LV vol s, MOD A2C: 33.6 ml LV vol s, MOD A4C: 39.9 ml LV SV MOD A2C:     45.9 ml LV SV MOD A4C:     90.9 ml LV SV MOD BP:      47.4 ml RIGHT VENTRICLE RV Basal diam:  3.90 cm RV S prime:     10.40 cm/s TAPSE (M-mode): 2.1 cm LEFT ATRIUM             Index        RIGHT ATRIUM           Index LA diam:        3.10 cm 1.91 cm/m   RA Area:     13.20 cm LA Vol (A2C):   28.6 ml 17.58 ml/m  RA Volume:   31.60 ml  19.42 ml/m LA Vol (A4C):   30.7 ml 18.87 ml/m LA Biplane Vol: 30.7 ml 18.87 ml/m  AORTIC VALVE AV Area (Vmax):    2.36 cm AV Area (Vmean):   2.25 cm AV Area (VTI):     2.23 cm AV Vmax:  107.50 cm/s AV Vmean:          72.700 cm/s AV VTI:            0.221 m AV Peak Grad:      4.6 mmHg AV Mean Grad:      2.5 mmHg LVOT Vmax:         89.45 cm/s LVOT Vmean:        57.650 cm/s LVOT VTI:          0.174 m LVOT/AV VTI ratio: 0.79  AORTA Ao Root diam: 3.50 cm Ao Asc diam:  2.90 cm MITRAL VALVE               TRICUSPID VALVE MV Area (PHT): 4.74 cm    TR Peak grad:   19.9 mmHg MV Area VTI:   2.55 cm    TR Vmax:        223.00 cm/s MV Peak grad:  2.4  mmHg MV Mean grad:  1.0 mmHg    SHUNTS MV Vmax:       0.77 m/s    Systemic VTI:  0.17 m MV Vmean:      42.2 cm/s   Systemic Diam: 1.90 cm MV Decel Time: 160 msec MV E velocity: 64.10 cm/s MV A velocity: 71.80 cm/s MV E/A ratio:  0.89 Laurance Flatten MD Electronically signed by Laurance Flatten MD Signature Date/Time: 11/01/2022/2:20:16 PM    Final         Scheduled Meds:  amantadine  100 mg Oral BID   benztropine  1 mg Oral BID   divalproex  500 mg Oral BID   enoxaparin (LOVENOX) injection  40 mg Subcutaneous Q24H   levETIRAcetam  500 mg Oral BID   lisinopril  10 mg Oral Daily   loratadine  10 mg Oral Daily   QUEtiapine  100 mg Oral QHS   traZODone  50 mg Oral QHS   Continuous Infusions:  sodium chloride 100 mL/hr at 11/02/22 1515     LOS: 1 day    Time spent: 35 minutes.    Berton Mount, MD  [facility] Pager #: [phone] 7PM-7AM contact night coverage as above

## 2022-11-03 DIAGNOSIS — R55 Syncope and collapse: Secondary | ICD-10-CM | POA: Diagnosis not present

## 2022-11-03 LAB — RENAL FUNCTION PANEL
Albumin: 2.8 g/dL — ABNORMAL LOW (ref 3.5–5.0)
Anion gap: 8 (ref 5–15)
BUN: 18 mg/dL (ref 8–23)
CO2: 21 mmol/L — ABNORMAL LOW (ref 22–32)
Calcium: 8.6 mg/dL — ABNORMAL LOW (ref 8.9–10.3)
Chloride: 108 mmol/L (ref 98–111)
Creatinine, Ser: 1.09 mg/dL (ref 0.61–1.24)
GFR, Estimated: >60 mL/min (ref >60)
Glucose, Bld: 93 mg/dL (ref 70–99)
Phosphorus: 3.4 mg/dL (ref 2.5–4.6)
Potassium: 4 mmol/L (ref 3.5–5.1)
Sodium: 137 mmol/L (ref 135–145)

## 2022-11-03 LAB — CBC WITH DIFFERENTIAL/PLATELET
Abs Immature Granulocytes: 0.04 10*3/uL (ref 0.00–0.07)
Basophils Absolute: 0 10*3/uL (ref 0.0–0.1)
Basophils Relative: 0 %
Eosinophils Absolute: 0.2 10*3/uL (ref 0.0–0.5)
Eosinophils Relative: 2 %
HCT: 31.3 % — ABNORMAL LOW (ref 39.0–52.0)
Hemoglobin: 10.2 g/dL — ABNORMAL LOW (ref 13.0–17.0)
Immature Granulocytes: 1 %
Lymphocytes Relative: 17 %
Lymphs Abs: 1.4 10*3/uL (ref 0.7–4.0)
MCH: 26.8 pg (ref 26.0–34.0)
MCHC: 32.6 g/dL (ref 30.0–36.0)
MCV: 82.4 fL (ref 80.0–100.0)
Monocytes Absolute: 0.7 10*3/uL (ref 0.1–1.0)
Monocytes Relative: 8 %
Neutro Abs: 6 10*3/uL (ref 1.7–7.7)
Neutrophils Relative %: 72 %
Platelets: 203 10*3/uL (ref 150–400)
RBC: 3.8 MIL/uL — ABNORMAL LOW (ref 4.22–5.81)
RDW: 14.4 % (ref 11.5–15.5)
WBC: 8.4 10*3/uL (ref 4.0–10.5)
nRBC: 0 % (ref 0.0–0.2)

## 2022-11-03 LAB — MAGNESIUM: Magnesium: 1.4 mg/dL — ABNORMAL LOW (ref 1.7–2.4)

## 2022-11-03 MED ORDER — CLOZAPINE 25 MG PO TABS: ORAL_TABLET | ORAL | 0 refills | Status: AC

## 2022-11-03 MED ORDER — MAGNESIUM SULFATE 4 GM/100ML IV SOLN
4.0000 g | Freq: Once | INTRAVENOUS | Status: AC
  Administered 2022-11-03: 4 g via INTRAVENOUS
  Filled 2022-11-03: qty 100

## 2022-11-03 NOTE — Progress Notes (Signed)
Discharge instructions (including medications) discussed with and copy provided to patient/caregiver 

## 2022-11-03 NOTE — Plan of Care (Signed)

## 2022-11-03 NOTE — NC FL2 (Addendum)
[place] MEDICAID FL2 LEVEL OF CARE FORM     IDENTIFICATION  Patient Name: Ronald Roman Birthdate: 05-May-1961 Sex: male Admission Date (Current Location): 10/31/2022  [facility] and [place] Number:  Producer, television/film/video and Address:  [facility], [address], [place], [name] [address]      Provider Number: [id number]  Attending Physician Name and Address:  Zigmund Daniel., *  Relative Name and Phone Number:  [name] (Legal Guardian) [phone]    Current Level of Care: Hospital Recommended Level of Care: Assisted Living Facility Prior Approval Number:    Date Approved/Denied:   PASRR Number:    Discharge Plan: Other (Comment) ([facility])    Current Diagnoses: Patient Active Problem List   Diagnosis Date Noted   Syncope 10/31/2022   Gastroesophageal reflux disease with esophagitis without hemorrhage 08/21/2022   Abnormal CT scan, gastrointestinal tract 08/16/2022   Constipation 08/16/2022   Coffee ground emesis 08/15/2022   Insomnia due to other mental disorder 11/18/2020   Episode of recurrent major depressive disorder (HCC) 11/18/2020   Acute metabolic encephalopathy 10/13/2020   Impaired cognitive ability    Aggressive behavior    Seizure (HCC) 05/07/2020   AMS (altered mental status) 02/09/2020   Schizophrenia (HCC)    Leukocytosis 02/06/2015   Sleep apnea 02/05/2015   AKI (acute kidney injury) (HCC) 02/04/2015   Seizure disorder (HCC) 02/04/2015   Acute encephalopathy 02/04/2015   Diabetes mellitus without complication (HCC)    Hyperlipidemia    Hypertension    Paranoid schizophrenia (HCC)     Orientation RESPIRATION BLADDER Height & Weight     Self, Time, Situation, Place  Normal External Urinary Catheter Weight: 123 lb 1.6 oz (55.8 kg) Height:  5\' 6"  (167.6 cm)  BEHAVIORAL SYMPTOMS/MOOD NEUROLOGICAL BOWEL NUTRITION STATUS      Continent Diet (heart heathy, diabetic)  AMBULATORY STATUS COMMUNICATION OF  NEEDS Skin   Limited Assist Verbally Other (Comment) (Appropriate for ethnicity,Wound/Incision LDAs)                       Personal Care Assistance Level of Assistance  Bathing, Feeding, Dressing Bathing Assistance: Limited assistance Feeding assistance: Independent Dressing Assistance: Limited assistance     Functional Limitations Info  Sight, Hearing, Speech Sight Info: Adequate Hearing Info: Adequate Speech Info: Adequate    SPECIAL CARE FACTORS FREQUENCY                       Contractures Contractures Info: Not present    Additional Factors Info  Code Status, Allergies, Psychotropic Code Status Info: FULL Allergies Info: Cogentin (benztropine),Penicillins,Prolixin (fluphenazine) Psychotropic Info: QUEtiapine (SEROQUEL) tablet 100 mg daily at bedtime,traZODone (DESYREL) tablet 50 mg daily at bedtime ,Divalproex (Depakote ER) 24 hr tablet 500 mg,2 times daily,  Levetiracetam (Keppra) tablet 500 mg,2 times daily       TAKE these medications     amantadine 100 MG capsule Commonly known as: SYMMETREL Take 100 mg by mouth 2 (two) times daily.    benztropine 1 MG tablet Commonly known as: COGENTIN Take 1 mg by mouth 2 (two) times daily.    cetirizine 10 MG tablet Commonly known as: ZYRTEC Take 10 mg by mouth daily.    cloZAPine 25 MG tablet Commonly known as: CLOZARIL Take 1 tablet (25 mg total) by mouth 2 (two) times daily for 3 days, THEN 2 tablets (50 mg total) 2 (two) times daily. Needs prompt follow  up with outpatient psychiatry and intermittent orthostatic checks. Start taking on: November 03, 2022 What changed: See the new instructions.    dapagliflozin propanediol 10 MG Tabs tablet Commonly known as: FARXIGA Take 10 mg by mouth daily after breakfast.    divalproex 500 MG 24 hr tablet Commonly known as: DEPAKOTE ER Take 500 mg by mouth 2 (two) times daily.    ergocalciferol 1.25 MG (50000 UT) capsule Commonly known as: VITAMIN D2 Take 50,000  Units by mouth once a week. Thursday    hydrOXYzine 25 MG tablet Commonly known as: ATARAX Take 25 mg by mouth 2 (two) times daily as needed for anxiety.    levETIRAcetam 500 MG tablet Commonly known as: KEPPRA Take 500 mg by mouth 2 (two) times daily.    magnesium oxide 400 (240 Mg) MG tablet Commonly known as: MAG-OX Take 1 tablet (400 mg total) by mouth daily.    metFORMIN 1000 MG tablet Commonly known as: GLUCOPHAGE Take 1,000 mg by mouth 2 (two) times daily with a meal.    mirtazapine 15 MG disintegrating tablet Commonly known as: REMERON SOL-TAB Take 15 mg by mouth at bedtime.    QUEtiapine 200 MG tablet Commonly known as: SEROQUEL Take 1 tablet (200 mg total) by mouth at bedtime.    QUEtiapine 50 MG tablet Commonly known as: SEROQUEL Take 1 tablet (50 mg total) by mouth See admin instructions. Take 50 mg (1 tablet) by mouth every morning and at 3 pm.    traZODone 100 MG tablet Commonly known as: DESYREL Take 1 tablet (100 mg total) by mouth at bedtime.    zolpidem 5 MG tablet Commonly known as: AMBIEN Take 1 tablet (5 mg total) by mouth at bedtime as needed for sleep.      Relevant Imaging Results:  Relevant Lab Results:   Additional Information [id number]  Ronald Roman, LCSWA

## 2022-11-03 NOTE — Progress Notes (Signed)
Called [phone] to give report. Report given to Hydia, Charity fundraiser.

## 2022-11-03 NOTE — Plan of Care (Signed)

## 2022-11-03 NOTE — TOC Transition Note (Signed)
Transition of Care [facility]) - CM/SW Discharge Note   Patient Details  Name: Ronald Roman MRN: [id number] Date of Birth: 05-03-1961  Transition of Care [facility]) CM/SW Contact:  Delilah Shan, LCSWA Phone Number: 11/03/2022, 1:59 PM   Clinical Narrative:     Patient will DC to: [facility]  Anticipated DC date: 11/03/2022  Family notified: Interior and spatial designer by: ALF  ?  Per MD patient ready for DC to [facility] . RN, patient, patient's legal guardian, and facility notified of DC. Discharge Summary and FL2 sent to facility. RN given number for report [phone] RM# [phone]. DC packet on chart. ALF transport requested for patient.  CSW signing off.   Final next level of care: Assisted Living Barriers to Discharge: No Barriers Identified   Patient Goals and CMS Choice   Choice offered to / list presented to : Patient  Discharge Placement                Patient chooses bed at:  [facility]) Patient to be transferred to facility by: ALF Name of family member notified: Jewel Legal Guardian Patient and family notified of of transfer: 11/03/22  Discharge Plan and Services Additional resources added to the After Visit Summary for   In-house Referral: Clinical Social Work                                   Social Determinants of Health (SDOH) Interventions SDOH Screenings   Food Insecurity: Patient Declined (11/02/2022)  Housing: Patient Declined (11/02/2022)  Transportation Needs: No Transportation Needs (11/02/2022)  Utilities: Not At Risk (11/02/2022)  Depression (PHQ2-9): Medium Risk (04/29/2021)  Tobacco Use: Medium Risk (10/31/2022)     Readmission Risk Interventions     No data to display

## 2022-11-03 NOTE — Discharge Summary (Signed)
Physician Discharge Summary  Ronald Roman ZOX:[id number] DOB: 08-09-1960 DOA: 10/31/2022  PCP: [facility]  Admit date: 10/31/2022 Discharge date: 11/03/2022  Time spent: 40 minutes  Recommendations for Outpatient Follow-up:  Follow outpatient CBC/CMP  Follow with outpatient psychiatrist regarding concern for clozapine side effects (orthostasis) Check orthostatic blood pressure every 2-3 days, follow up with psychiatry or PCP as needed for management of orthostatic hypotension Lisinopril d/c'd, follow outpatient blood pressure   Concern for foul smelling urine here, UA ordered, but not collected, follow outpatient as needed  Discharge Diagnoses:  Principal Problem:   Syncope   Discharge Condition: stable  Diet recommendation: heart heathy, diabetic  Filed Weights   10/31/22 1100 11/01/22 0206  Weight: 69.9 kg 55.8 kg    History of present illness:   Patient is Ronald Roman 62 year old African-American male with past medical history significant for schizophrenia, essential hypertension, diabetes mellitus type 2, seizure.  Patient was admitted with syncope, hypotension and acute kidney injury.  Concern was for syncope related to hypovolemia/orthostasis.  Patient also on clozapine, which could have contributed to orthostatic hypotension.  He's improved with IVF.   See below for additional details   Hospital Course:  Assessment and Plan:   Syncope  Orthostatic Hypotension: -Etiology is likely multifactorial.  Orthostatic at presentation, now improved.  Also concern for volume depletion with AKI and orthostasis. -clozapine could contribute -will stop lisinopril - echo with low normal EF, no RWMA - start compression stockings -needs follow up of orthostatics outpatient every 2-3 days with his assisted living facility.  If continued orthostatic hypotension, would treat as indicated, consider discussion with psychiatry outpatient given concern for clozapine as possible  contributor.    History of paranoid schizophrenia Discussed clozapine with psychiatry given his interruption in dosing and orthostasis -> recommended continuing clozapine at 1/2 dose for Ronald Roman few days, then resume home dose given interruption.  He'll need follow up of potential side effects outpatient with psychiatry.  Continue amantadine, benztropine, seroquel, trazodone.  Seizure disorder No reported seizure-like activities Resume home keppra and depakote  Lactic acidosis resolved   Acute kidney injury: Resolved  T2DM Resume metformin and farxiga at discharge, follow outpatient   Hypertension With orthostatics, stop lisinopril, follow outpatient prn  Foul Smelling Urine Alone, not an indication for UA - hasn't yet been collected. follow for concerning symptoms, can follow UA outpatient as needed    Procedures: Echo   IMPRESSIONS     1. Left ventricular ejection fraction, by estimation, is 50 to 55%. The  left ventricle has low normal function. The left ventricle has no regional  wall motion abnormalities. Left ventricular diastolic parameters are  consistent with Grade I diastolic  dysfunction (impaired relaxation).   2. Right ventricular systolic function is normal. The right ventricular  size is mildly enlarged.   3. The mitral valve is normal in structure. Trivial mitral valve  regurgitation.   4. The aortic valve is tricuspid. Aortic valve regurgitation is not  visualized. Aortic valve sclerosis is present, with no evidence of aortic  valve stenosis.   Comparison(s): No prior Echocardiogram.   Consultations: none  Discharge Exam: Vitals:   11/02/22 2025 11/03/22 0328  BP: 121/88 132/80  Pulse: (!) 121 80  Resp:    Temp: 98.7 F (37.1 C) 98.3 F (36.8 C)  SpO2: 100% 100%   No complaints Denies dysuria or abdominal pain Discussed with guardian and ALF staff   General: No acute distress. Cardiovascular: RRR Lungs: unlabored Abdomen: Soft, nontender,  nondistended Neurological: Alert and oriented 3. Moves all extremities 4. Cranial nerves II through XII grossly intact. Extremities: No clubbing or cyanosis. No edema  Discharge Instructions   Discharge Instructions     Call MD for:  difficulty breathing, headache or visual disturbances   Complete by: As directed    Call MD for:  extreme fatigue   Complete by: As directed    Call MD for:  hives   Complete by: As directed    Call MD for:  persistant dizziness or light-headedness   Complete by: As directed    Call MD for:  persistant nausea and vomiting   Complete by: As directed    Call MD for:  redness, tenderness, or signs of infection (pain, swelling, redness, odor or green/yellow discharge around incision site)   Complete by: As directed    Call MD for:  severe uncontrolled pain   Complete by: As directed    Call MD for:  temperature >100.4   Complete by: As directed    Diet - low sodium heart healthy   Complete by: As directed    Discharge instructions   Complete by: As directed    You were seen for an episode of syncope.  We think this was due to volume depletion (dehydration), but it also could be related to your clozapine.  STOP your lisinopril for now.  We'll restart your clozapine at 25 mg twice Ronald Roman day, after 3 days, resume your 50 mg twice Ronald Roman day.  You should follow up orthostatics every 2-3 days or as needed for lightheadedness.    Use compression stockings to help with your low blood pressure with standing.  Your outpatient doctor can consider other treatments for "orthostatic hypotension" if it is persistent or worsening.  Return for new, recurrent, or worsening symptoms.  Please ask your PCP to request records from this hospitalization so they know what was done and what the next steps will be.   Increase activity slowly   Complete by: As directed       Allergies as of 11/03/2022       Reactions   Cogentin [benztropine] Other (See Comments)   Not  documented on the [facility]   Penicillins Other (See Comments)   Not documented on the [facility]   Prolixin [fluphenazine] Other (See Comments)   Not documented on the [facility]        Medication List     STOP taking these medications    lisinopril 10 MG tablet Commonly known as: ZESTRIL       TAKE these medications    amantadine 100 MG capsule Commonly known as: SYMMETREL Take 100 mg by mouth 2 (two) times daily.   benztropine 1 MG tablet Commonly known as: COGENTIN Take 1 mg by mouth 2 (two) times daily.   cetirizine 10 MG tablet Commonly known as: ZYRTEC Take 10 mg by mouth daily.   cloZAPine 25 MG tablet Commonly known as: CLOZARIL Take 1 tablet (25 mg total) by mouth 2 (two) times daily for 3 days, THEN 2 tablets (50 mg total) 2 (two) times daily. Needs prompt follow up with outpatient psychiatry and intermittent orthostatic checks. Start taking on: November 03, 2022 What changed: See the new instructions.   dapagliflozin propanediol 10 MG Tabs tablet Commonly known as: FARXIGA Take 10 mg by mouth daily after breakfast.   divalproex 500 MG 24 hr tablet Commonly known as: DEPAKOTE ER Take 500 mg by mouth 2 (two) times daily.   ergocalciferol  1.25 MG (50000 UT) capsule Commonly known as: VITAMIN D2 Take 50,000 Units by mouth once Adnan Vanvoorhis week. Thursday   hydrOXYzine 25 MG tablet Commonly known as: ATARAX Take 25 mg by mouth 2 (two) times daily as needed for anxiety.   levETIRAcetam 500 MG tablet Commonly known as: KEPPRA Take 500 mg by mouth 2 (two) times daily.   magnesium oxide 400 (240 Mg) MG tablet Commonly known as: MAG-OX Take 1 tablet (400 mg total) by mouth daily.   metFORMIN 1000 MG tablet Commonly known as: GLUCOPHAGE Take 1,000 mg by mouth 2 (two) times daily with Jawana Reagor meal.   mirtazapine 15 MG disintegrating tablet Commonly known as: REMERON SOL-TAB Take 15 mg by mouth at bedtime.   QUEtiapine 200 MG tablet Commonly known as: SEROQUEL Take 1 tablet (200 mg  total) by mouth at bedtime.   QUEtiapine 50 MG tablet Commonly known as: SEROQUEL Take 1 tablet (50 mg total) by mouth See admin instructions. Take 50 mg (1 tablet) by mouth every morning and at 3 pm.   traZODone 100 MG tablet Commonly known as: DESYREL Take 1 tablet (100 mg total) by mouth at bedtime.   zolpidem 5 MG tablet Commonly known as: AMBIEN Take 1 tablet (5 mg total) by mouth at bedtime as needed for sleep.       Allergies  Allergen Reactions   Cogentin [Benztropine] Other (See Comments)    Not documented on the [facility]   Penicillins Other (See Comments)    Not documented on the [facility]    Prolixin [Fluphenazine] Other (See Comments)    Not documented on the [facility]       The results of significant diagnostics from this hospitalization (including imaging, microbiology, ancillary and laboratory) are listed below for reference.    Significant Diagnostic Studies: ECHOCARDIOGRAM COMPLETE  Result Date: 11/01/2022    ECHOCARDIOGRAM REPORT   Patient Name:   BURNICE DORNBUSH Date of Exam: 11/01/2022 Medical Rec #:  [id number]        Height:       66.0 in Accession #:    [id number]       Weight:       123.1 lb Date of Birth:  30-Nov-1960        BSA:          1.627 m Patient Age:    61 years         BP:           113/95 mmHg Patient Gender: M                HR:           71 bpm. Exam Location:  Inpatient Procedure: 2D Echo, Cardiac Doppler and Color Doppler Indications:    Syncope  History:        Patient has no prior history of Echocardiogram examinations.                 Seizure disorder, Signs/Symptoms:Syncope; Risk Factors:Diabetes,                 Dyslipidemia, Hypertension, Sleep Apnea and Former Smoker.  Sonographer:    Wallie Char Referring Phys: [id number] CAROLE N HALL IMPRESSIONS  1. Left ventricular ejection fraction, by estimation, is 50 to 55%. The left ventricle has low normal function. The left ventricle has no regional wall motion abnormalities. Left ventricular diastolic  parameters are consistent with Grade I diastolic dysfunction (impaired relaxation).  2. Right ventricular systolic function is normal.  The right ventricular size is mildly enlarged.  3. The mitral valve is normal in structure. Trivial mitral valve regurgitation.  4. The aortic valve is tricuspid. Aortic valve regurgitation is not visualized. Aortic valve sclerosis is present, with no evidence of aortic valve stenosis. Comparison(s): No prior Echocardiogram. FINDINGS  Left Ventricle: Left ventricular ejection fraction, by estimation, is 50 to 55%. The left ventricle has low normal function. The left ventricle has no regional wall motion abnormalities. The left ventricular internal cavity size was normal in size. There is no left ventricular hypertrophy. Left ventricular diastolic parameters are consistent with Grade I diastolic dysfunction (impaired relaxation). Right Ventricle: The right ventricular size is mildly enlarged. No increase in right ventricular wall thickness. Right ventricular systolic function is normal. Left Atrium: Left atrial size was normal in size. Right Atrium: Right atrial size was normal in size. Pericardium: There is no evidence of pericardial effusion. Mitral Valve: The mitral valve is normal in structure. Trivial mitral valve regurgitation. MV peak gradient, 2.4 mmHg. The mean mitral valve gradient is 1.0 mmHg. Tricuspid Valve: The tricuspid valve is normal in structure. Tricuspid valve regurgitation is trivial. Aortic Valve: The aortic valve is tricuspid. Aortic valve regurgitation is not visualized. Aortic valve sclerosis is present, with no evidence of aortic valve stenosis. Aortic valve mean gradient measures 2.5 mmHg. Aortic valve peak gradient measures 4.6  mmHg. Aortic valve area, by VTI measures 2.23 cm. Pulmonic Valve: The pulmonic valve was normal in structure. Pulmonic valve regurgitation is trivial. Aorta: The aortic root is normal in size and structure. IAS/Shunts: The atrial  septum is grossly normal.  LEFT VENTRICLE PLAX 2D LVIDd:         4.00 cm     Diastology LVIDs:         2.80 cm     LV e' medial:    9.89 cm/s LV PW:         1.00 cm     LV E/e' medial:  6.5 LV IVS:        1.00 cm     LV e' lateral:   13.70 cm/s LVOT diam:     1.90 cm     LV E/e' lateral: 4.7 LV SV:         49 LV SV Index:   30 LVOT Area:     2.84 cm  LV Volumes (MOD) LV vol d, MOD A2C: 79.5 ml LV vol d, MOD A4C: 90.9 ml LV vol s, MOD A2C: 33.6 ml LV vol s, MOD A4C: 39.9 ml LV SV MOD A2C:     45.9 ml LV SV MOD A4C:     90.9 ml LV SV MOD BP:      47.4 ml RIGHT VENTRICLE RV Basal diam:  3.90 cm RV S prime:     10.40 cm/s TAPSE (M-mode): 2.1 cm LEFT ATRIUM             Index        RIGHT ATRIUM           Index LA diam:        3.10 cm 1.91 cm/m   RA Area:     13.20 cm LA Vol (A2C):   28.6 ml 17.58 ml/m  RA Volume:   31.60 ml  19.42 ml/m LA Vol (A4C):   30.7 ml 18.87 ml/m LA Biplane Vol: 30.7 ml 18.87 ml/m  AORTIC VALVE AV Area (Vmax):    2.36 cm AV Area (Vmean):   2.25 cm AV  Area (VTI):     2.23 cm AV Vmax:           107.50 cm/s AV Vmean:          72.700 cm/s AV VTI:            0.221 m AV Peak Grad:      4.6 mmHg AV Mean Grad:      2.5 mmHg LVOT Vmax:         89.45 cm/s LVOT Vmean:        57.650 cm/s LVOT VTI:          0.174 m LVOT/AV VTI ratio: 0.79  AORTA Ao Root diam: 3.50 cm Ao Asc diam:  2.90 cm MITRAL VALVE               TRICUSPID VALVE MV Area (PHT): 4.74 cm    TR Peak grad:   19.9 mmHg MV Area VTI:   2.55 cm    TR Vmax:        223.00 cm/s MV Peak grad:  2.4 mmHg MV Mean grad:  1.0 mmHg    SHUNTS MV Vmax:       0.77 m/s    Systemic VTI:  0.17 m MV Vmean:      42.2 cm/s   Systemic Diam: 1.90 cm MV Decel Time: 160 msec MV E velocity: 64.10 cm/s MV Trevor Duty velocity: 71.80 cm/s MV E/Javonna Balli ratio:  0.89 Laurance Flatten MD Electronically signed by Laurance Flatten MD Signature Date/Time: 11/01/2022/2:20:16 PM    Final    CT Angio Chest PE W and/or Wo Contrast  Result Date: 10/31/2022 CLINICAL DATA:  Pulmonary  embolism (PE) suspected, high prob; Abdominal pain, acute, nonlocalized. EXAM: CT ANGIOGRAPHY CHEST CT ABDOMEN AND PELVIS WITH CONTRAST TECHNIQUE: Multidetector CT imaging of the chest was performed using the standard protocol during bolus administration of intravenous contrast. Multiplanar CT image reconstructions and MIPs were obtained to evaluate the vascular anatomy. Multidetector CT imaging of the abdomen and pelvis was performed using the standard protocol during bolus administration of intravenous contrast. RADIATION DOSE REDUCTION: This exam was performed according to the departmental dose-optimization program which includes automated exposure control, adjustment of the mA and/or kV according to patient size and/or use of iterative reconstruction technique. CONTRAST:  OMNIPAQUE IOHEXOL 350 MG/ML SOLN COMPARISON:  CT abdomen/pelvis 08/15/2022. FINDINGS: CTA CHEST FINDINGS Cardiovascular: Satisfactory opacification of the pulmonary arteries to the segmental level. No evidence of pulmonary embolism. Normal heart size. No pericardial effusion. Coronary artery calcifications. Mediastinum/Nodes: No enlarged mediastinal, hilar, or axillary lymph nodes. Thyroid gland, trachea, and esophagus demonstrate no significant findings. Lungs/Pleura: Lungs are clear. No pleural effusion or pneumothorax. Musculoskeletal: Within the limitations of respiratory motion artifact, no chest wall abnormality or significant bony findings. Review of the MIP images confirms the above findings. CT ABDOMEN and PELVIS FINDINGS Hepatobiliary: No focal liver abnormality is seen. No gallstones, gallbladder wall thickening, or biliary dilatation. Pancreas: Unremarkable. No pancreatic ductal dilatation or surrounding inflammatory changes. Spleen: Normal. Adrenals/Urinary Tract: Adrenal glands are unremarkable. Within limits of motion artifact, kidneys are unremarkable without mass, calculi or hydronephrosis. Bladder is unremarkable.  Stomach/Bowel: Normal stomach and duodenum. No dilated loops of small bowel. Normal appendix is visualized on coronal image 95 series 6. Large burden of stool throughout the colon. No bowel wall thickening or surrounding inflammation. Vascular/Lymphatic: Aortic atherosclerosis. No enlarged abdominal or pelvic lymph nodes. Reproductive: Prostate is unremarkable. Other: No abdominal wall hernia or abnormality. No abdominopelvic ascites. Musculoskeletal: Lower lumbar spondylosis.  Degenerative changes of the left-greater-than-right SI joints. Review of the MIP images confirms the above findings. IMPRESSION: 1. No evidence of pulmonary embolism or other acute abnormality in the chest. 2. No acute abnormality in the abdomen or pelvis. 3. Large burden of stool throughout the colon. Correlate for constipation. 4. Coronary artery disease. 5. Degenerative changes of the left-greater-than-right SI joints. Aortic Atherosclerosis (ICD10-I70.0). Electronically Signed   By: Orvan Falconer M.D.   On: 10/31/2022 13:20   CT ABDOMEN PELVIS W CONTRAST  Result Date: 10/31/2022 CLINICAL DATA:  Pulmonary embolism (PE) suspected, high prob; Abdominal pain, acute, nonlocalized. EXAM: CT ANGIOGRAPHY CHEST CT ABDOMEN AND PELVIS WITH CONTRAST TECHNIQUE: Multidetector CT imaging of the chest was performed using the standard protocol during bolus administration of intravenous contrast. Multiplanar CT image reconstructions and MIPs were obtained to evaluate the vascular anatomy. Multidetector CT imaging of the abdomen and pelvis was performed using the standard protocol during bolus administration of intravenous contrast. RADIATION DOSE REDUCTION: This exam was performed according to the departmental dose-optimization program which includes automated exposure control, adjustment of the mA and/or kV according to patient size and/or use of iterative reconstruction technique. CONTRAST:  OMNIPAQUE IOHEXOL 350 MG/ML SOLN COMPARISON:  CT  abdomen/pelvis 08/15/2022. FINDINGS: CTA CHEST FINDINGS Cardiovascular: Satisfactory opacification of the pulmonary arteries to the segmental level. No evidence of pulmonary embolism. Normal heart size. No pericardial effusion. Coronary artery calcifications. Mediastinum/Nodes: No enlarged mediastinal, hilar, or axillary lymph nodes. Thyroid gland, trachea, and esophagus demonstrate no significant findings. Lungs/Pleura: Lungs are clear. No pleural effusion or pneumothorax. Musculoskeletal: Within the limitations of respiratory motion artifact, no chest wall abnormality or significant bony findings. Review of the MIP images confirms the above findings. CT ABDOMEN and PELVIS FINDINGS Hepatobiliary: No focal liver abnormality is seen. No gallstones, gallbladder wall thickening, or biliary dilatation. Pancreas: Unremarkable. No pancreatic ductal dilatation or surrounding inflammatory changes. Spleen: Normal. Adrenals/Urinary Tract: Adrenal glands are unremarkable. Within limits of motion artifact, kidneys are unremarkable without mass, calculi or hydronephrosis. Bladder is unremarkable. Stomach/Bowel: Normal stomach and duodenum. No dilated loops of small bowel. Normal appendix is visualized on coronal image 95 series 6. Large burden of stool throughout the colon. No bowel wall thickening or surrounding inflammation. Vascular/Lymphatic: Aortic atherosclerosis. No enlarged abdominal or pelvic lymph nodes. Reproductive: Prostate is unremarkable. Other: No abdominal wall hernia or abnormality. No abdominopelvic ascites. Musculoskeletal: Lower lumbar spondylosis. Degenerative changes of the left-greater-than-right SI joints. Review of the MIP images confirms the above findings. IMPRESSION: 1. No evidence of pulmonary embolism or other acute abnormality in the chest. 2. No acute abnormality in the abdomen or pelvis. 3. Large burden of stool throughout the colon. Correlate for constipation. 4. Coronary artery disease. 5.  Degenerative changes of the left-greater-than-right SI joints. Aortic Atherosclerosis (ICD10-I70.0). Electronically Signed   By: Orvan Falconer M.D.   On: 10/31/2022 13:20    Microbiology: No results found for this or any previous visit (from the past 240 hour(s)).   Labs: Basic Metabolic Panel: Recent Labs  Lab 10/31/22 1055 11/01/22 0350 11/03/22 0447  NA 138 139 137  K 5.1 4.1 4.0  CL 107 105 108  CO2 24 21* 21*  GLUCOSE 109* 104* 93  BUN 32* 25* 18  CREATININE 1.47* 1.19 1.09  CALCIUM 9.2 9.4 8.6*  MG  --  1.5* 1.4*  PHOS  --  3.2 3.4   Liver Function Tests: Recent Labs  Lab 10/31/22 1437 11/03/22 0447  AST 15  --   ALT  9  --   ALKPHOS 57  --   BILITOT <0.1*  --   PROT 6.3*  --   ALBUMIN 3.2* 2.8*   No results for input(s): "LIPASE", "AMYLASE" in the last 168 hours. No results for input(s): "AMMONIA" in the last 168 hours. CBC: Recent Labs  Lab 10/31/22 1055 11/01/22 0349 11/03/22 0447  WBC 6.8 8.6 8.4  NEUTROABS  --   --  6.0  HGB 10.7* 12.0* 10.2*  HCT 33.9* 37.4* 31.3*  MCV 86.3 86.4 82.4  PLT 230 231 203   Cardiac Enzymes: No results for input(s): "CKTOTAL", "CKMB", "CKMBINDEX", "TROPONINI" in the last 168 hours. BNP: BNP (last 3 results) Recent Labs    08/22/22 1512  BNP 94.0    ProBNP (last 3 results) No results for input(s): "PROBNP" in the last 8760 hours.  CBG: Recent Labs  Lab 10/31/22 1317  GLUCAP 97       Signed:  Lacretia Nicks MD.  [facility] 11/03/2022, 1:48 PM

## 2022-11-16 ENCOUNTER — Ambulatory Visit (INDEPENDENT_AMBULATORY_CARE_PROVIDER_SITE_OTHER): Payer: Medicaid Other | Admitting: Neurology

## 2022-11-16 ENCOUNTER — Encounter: Payer: Self-pay | Admitting: Neurology

## 2022-11-16 VITALS — BP 83/65 | HR 100 | Wt 123.2 lb

## 2022-11-16 DIAGNOSIS — Z87898 Personal history of other specified conditions: Secondary | ICD-10-CM

## 2022-11-16 NOTE — Progress Notes (Signed)
NEUROLOGY FOLLOW UP OFFICE NOTE  Ronald Roman [id number] January 19, 1961  HISTORY OF PRESENT ILLNESS: I had the pleasure of seeing Ronald Roman in follow-up in the neurology clinic on 11/16/2022.  The patient was last seen 3 months ago for seizures. He is accompanied by SNF transportation staff Dorothy who helps provide history today.  Records and images were personally reviewed where available.  On his visit in March, he was noted to be quite ataxic, unable to walk. He was then admitted to [facility] on 08/15/22 for sepsis, abdominal distension, coffee-ground emesis. He was hypotensive with a K of 7.4 and acute kidney injury. He was back in the hospital from June 9-12 for syncope due to orthostatic hypotension, advised to discuss clozapine side effects (orthostasis) with Psychiatry, Lisinopril stopped. Outpatient urinalysis was recommended since they could not collect specimen. BP in the office today is low,  74/61. After hydration, BP was 83/65. He denies any dizziness. He feels okay. He is a poor historian, answers questions with 1-2 word answers. He denies any seizures. He denies any vision changes, dizziness, focal numbness/tingling/weakness. He says he had falls 2-3 months ago, Nicole Cella cannot say for sure. Sleep is okay. He lives at [facility] [place], their computer system was down so EMAR could not be printed. He is listed as taking Depakote 500mg  BID and Levetiracetam 500mg  BID for seizure prophylaxis.   I personally reviewed MRI brain without contrast done 08/2022 no acute changes, there were moderate diffuse volume loss, mild chronic microvascular disease. His EEG in 07/2022 was normal.   HISTORY OF PRESENT ILLNESS: This is a 62 year old right-handed man with a history of hypertension, hyperlipidemia, DM2, paranoid schizophrenia, and seizures, presenting to establish care. Records were reviewed, patient is a poor historian and he has only been living at current SNF for the past month. He  states seizures started at age 4 and that his mother had seizures. He cannot describe the seizures. Records dating back to 2016 indicate hospital admission for possible seizure. He reported a history of convulsions and was on Depakote. Apparently his residential facility reports at baseline he is disorganized but compliant with medications and responds appropriately. In 01/2015, they could not locate him for an entire day, and when he was found, he was laughing inappropriately. A few hour later, he was found lying on the porch with bowel incontinence, unresponsive. He was treated empirically with antibiotics for meningitis, lumbar puncture was normal. He was on Depakote with a therapeutic level. CK was >2223. EEG normal. In 2021, he had several admissions for altered mental status, MRI brain without contrast in 01/2020 showed periatrial white matter FLAIR hyperintensity mildly progressed and now greater on the right. In 09/2020, he was witnessed to have a seizure-like activity for approximately 15 minutes, then had another seizure with EMS lasting 4 minutes. Up until 03/2022, he was having increasing behaviors, wandering off in the middle of the night. He was in the ER on 05/29/22 for shaking and difficulty getting food inside his mouth, he was found to have the flu. On 07/08/22, he was in the ER for weakness and reported having a seizure.  He is sitting in a wheelchair today, mumbling answers, sometimes not making sense. He reports dizziness when standing, Marica Otter reports he falls back when she tries to stand him up. He feels both feet are weak. He is able to feed and dress himself. He denies any neck pain, points to mid-thoracic area when asked about back pain. He  reports difficulty swallowing, food is chopped, no choking.  Diagnostic Data: MRI brain without contrast done 08/2022 no acute changes, there were moderate diffuse volume loss, mild chronic microvascular disease.   EEG in 07/2022 was normal.   PAST  MEDICAL HISTORY: Past Medical History:  Diagnosis Date   Diabetes mellitus without complication (HCC)    GERD (gastroesophageal reflux disease)    Hyperlipidemia    Hypertension    Paranoid schizophrenia (HCC)    Seizures (HCC)    Sleep apnea    Uric acid nephrolithiasis     MEDICATIONS: Current Outpatient Medications on File Prior to Visit  Medication Sig Dispense Refill   amantadine (SYMMETREL) 100 MG capsule Take 100 mg by mouth 2 (two) times daily.     benztropine (COGENTIN) 1 MG tablet Take 1 mg by mouth 2 (two) times daily.     cetirizine (ZYRTEC) 10 MG tablet Take 10 mg by mouth daily.     cloZAPine (CLOZARIL) 25 MG tablet Take 1 tablet (25 mg total) by mouth 2 (two) times daily for 3 days, THEN 2 tablets (50 mg total) 2 (two) times daily. Needs prompt follow up with outpatient psychiatry and intermittent orthostatic checks. 126 tablet 0   dapagliflozin propanediol (FARXIGA) 10 MG TABS tablet Take 10 mg by mouth daily after breakfast.     divalproex (DEPAKOTE ER) 500 MG 24 hr tablet Take 500 mg by mouth 2 (two) times daily.     ergocalciferol (VITAMIN D2) 1.25 MG (50000 UT) capsule Take 50,000 Units by mouth once a week. Thursday     hydrOXYzine (ATARAX) 25 MG tablet Take 25 mg by mouth 2 (two) times daily as needed for anxiety.     levETIRAcetam (KEPPRA) 500 MG tablet Take 500 mg by mouth 2 (two) times daily.     magnesium oxide (MAG-OX) 400 (240 Mg) MG tablet Take 1 tablet (400 mg total) by mouth daily. 30 tablet 0   metFORMIN (GLUCOPHAGE) 1000 MG tablet Take 1,000 mg by mouth 2 (two) times daily with a meal.     mirtazapine (REMERON SOL-TAB) 15 MG disintegrating tablet Take 15 mg by mouth at bedtime.     QUEtiapine (SEROQUEL) 200 MG tablet Take 1 tablet (200 mg total) by mouth at bedtime.     QUEtiapine (SEROQUEL) 50 MG tablet Take 1 tablet (50 mg total) by mouth See admin instructions. Take 50 mg (1 tablet) by mouth every morning and at 3 pm.     traZODone (DESYREL) 100 MG  tablet Take 1 tablet (100 mg total) by mouth at bedtime. 10 tablet 1   zolpidem (AMBIEN) 5 MG tablet Take 1 tablet (5 mg total) by mouth at bedtime as needed for sleep. 10 tablet 0   No current facility-administered medications on file prior to visit.    ALLERGIES: Allergies  Allergen Reactions   Cogentin [Benztropine] Other (See Comments)    Not documented on the [facility]   Penicillins Other (See Comments)    Not documented on the [facility]    Prolixin [Fluphenazine] Other (See Comments)    Not documented on the [facility]     FAMILY HISTORY: History reviewed. No pertinent family history.  SOCIAL HISTORY: Social History   Socioeconomic History   Marital status: Single    Spouse name: Not on file   Number of children: Not on file   Years of education: Not on file   Highest education level: Not on file  Occupational History   Not on file  Tobacco Use  Smoking status: Former    Packs/day: 1    Types: Cigarettes   Smokeless tobacco: Never  Vaping Use   Vaping Use: Never used  Substance and Sexual Activity   Alcohol use: No   Drug use: No   Sexual activity: Not Currently  Other Topics Concern   Not on file  Social History Narrative   Lives at [facility] [place]   Right handed    Social Determinants of Health   Financial Resource Strain: Not on file  Food Insecurity: Patient Declined (11/02/2022)   Hunger Vital Sign    Worried About Running Out of Food in the Last Year: Patient declined    Ran Out of Food in the Last Year: Patient declined  Transportation Needs: No Transportation Needs (11/02/2022)   PRAPARE - Administrator, Civil Service (Medical): No    Lack of Transportation (Non-Medical): No  Physical Activity: Not on file  Stress: Not on file  Social Connections: Not on file  Intimate Partner Violence: Not At Risk (11/02/2022)   Humiliation, Afraid, Rape, and Kick questionnaire    Fear of Current or Ex-Partner: No    Emotionally Abused: No     Physically Abused: No    Sexually Abused: No     PHYSICAL EXAM: Vitals:   11/16/22 1107 11/16/22 1115  BP: (!) 76/60 (!) 74/61  Pulse: 100   SpO2: 95%    General: No acute distress Head:  Normocephalic/atraumatic Skin/Extremities: No rash, no edema Neurological Exam: alert and awake. Minimal verbal output, answers with 1-2 word responses, able to follow commands. No aphasia or dysarthria. Fund of knowledge is reduced. Attention and concentration are normal.   Cranial nerves: Pupils equal, round. Extraocular movements intact with no nystagmus. Visual fields full.  No facial asymmetry.  Motor: Bulk and tone normal, muscle strength 5/5 throughout with no pronator drift.   Finger to nose testing intact.  Gait narrow-based and steady, no ataxia.    IMPRESSION: This is a 62 yo RH man with a history of hypertension, hyperlipidemia, DM2, paranoid schizophrenia, and seizures. He is a poor historian, review of records indicate a history of convulsions. MRI brain and EEG unremarkable. Last report of seizure was in 06/2022. Continue Depakote 500mg  BID and Levetiracetam 500mg  BID. He is hypotensive in the office today, discussed recent hospitalization for syncope and note of orthostatic hypotension possibly related to clozapine, discuss with Psychiatry. Follow-up in 6 months, call for any changes.    Thank you for allowing me to participate in his care.  Please do not hesitate to call for any questions or concerns.   Patrcia Dolly, M.D.   CC: [facility]

## 2022-11-16 NOTE — Patient Instructions (Signed)
Continue all your medications, monitor BP and discuss with Psychiatry if BP still remains low. Some of the medications may lower his blood pressure. Follow-up in 6 months, call for any changes   Seizure Precautions: 1. If medication has been prescribed for you to prevent seizures, take it exactly as directed.  Do not stop taking the medicine without talking to your doctor first, even if you have not had a seizure in a long time.   2. Avoid activities in which a seizure would cause danger to yourself or to others.  Don't operate dangerous machinery, swim alone, or climb in high or dangerous places, such as on ladders, roofs, or girders.  Do not drive unless your doctor says you may.  3. If you have any warning that you may have a seizure, lay down in a safe place where you can't hurt yourself.    4.  No driving for 6 months from last seizure, as per [facility].   Please refer to the following link on the [employer]ile[employer] of [employer]'s website for more information: http://www.epilepsyfoundation.org/answerplace/Social/driving/drivingu.cfm   5.  Maintain good sleep hygiene.  6.  Contact your doctor if you have any problems that may be related to the medicine you are taking.  7.  Call 911 and bring the patient back to the ED if:        A.  The seizure lasts longer than 5 minutes.       B.  The patient doesn't awaken shortly after the seizure  C.  The patient has new problems such as difficulty seeing, speaking or moving  D.  The patient was injured during the seizure  E.  The patient has a temperature over 102 F (39C)  F.  The patient vomited and now is having trouble breathing

## 2022-11-19 ENCOUNTER — Emergency Department (HOSPITAL_COMMUNITY): Payer: Medicaid Other

## 2022-11-19 ENCOUNTER — Encounter (HOSPITAL_COMMUNITY): Payer: Self-pay

## 2022-11-19 ENCOUNTER — Other Ambulatory Visit: Payer: Self-pay

## 2022-11-19 ENCOUNTER — Emergency Department (HOSPITAL_COMMUNITY)
Admission: EM | Admit: 2022-11-19 | Discharge: 2022-11-19 | Disposition: A | Discharge status: Home / Self Care | Payer: Medicaid Other | Attending: Emergency Medicine | Admitting: Emergency Medicine

## 2022-11-19 DIAGNOSIS — Z79899 Other long term (current) drug therapy: Secondary | ICD-10-CM | POA: Diagnosis not present

## 2022-11-19 DIAGNOSIS — M533 Sacrococcygeal disorders, not elsewhere classified: Secondary | ICD-10-CM | POA: Insufficient documentation

## 2022-11-19 DIAGNOSIS — Y92009 Unspecified place in unspecified non-institutional (private) residence as the place of occurrence of the external cause: Secondary | ICD-10-CM

## 2022-11-19 DIAGNOSIS — Y92129 Unspecified place in nursing home as the place of occurrence of the external cause: Secondary | ICD-10-CM | POA: Diagnosis not present

## 2022-11-19 DIAGNOSIS — M25512 Pain in left shoulder: Secondary | ICD-10-CM | POA: Diagnosis not present

## 2022-11-19 DIAGNOSIS — W19XXXA Unspecified fall, initial encounter: Secondary | ICD-10-CM | POA: Diagnosis not present

## 2022-11-19 DIAGNOSIS — R519 Headache, unspecified: Secondary | ICD-10-CM | POA: Insufficient documentation

## 2022-11-19 DIAGNOSIS — E119 Type 2 diabetes mellitus without complications: Secondary | ICD-10-CM | POA: Insufficient documentation

## 2022-11-19 DIAGNOSIS — Z7984 Long term (current) use of oral hypoglycemic drugs: Secondary | ICD-10-CM | POA: Insufficient documentation

## 2022-11-19 NOTE — ED Notes (Signed)
[facility] called for transport back to residence 

## 2022-11-19 NOTE — ED Provider Notes (Signed)
EMERGENCY DEPARTMENT AT [facility] Provider Note   CSN: [id number] Arrival date & time: 11/19/22  1828     History  Chief Complaint  Patient presents with   Genia Hotter today, landed on butt. Complaining of back pain, sacral area. Denies LOC    Ronald Roman is a 62 y.o. male.  The history is provided by the patient, the EMS personnel and medical records. No language interpreter was used.  Fall     61 year old male significant history for schizophrenia, diabetes, recurrent falls, brought here via EMS from assisted living facility for evaluation of a fall.  Per EMS note, patient had a fall that was witnessed by staff.  It seems to be landing on his buttock but no loss of consciousness.  Staff report patient is at his baseline.  No other complaint.  At this time patient and does not have any significant complaints.  Did endorse mild headache and left shoulder pain.  No complaints of chest pain trouble breathing abdominal pain back pain or urinary symptoms.  Home Medications Prior to Admission medications   Medication Sig Start Date End Date Taking? Authorizing Provider  amantadine (SYMMETREL) 100 MG capsule Take 100 mg by mouth 2 (two) times daily.    [provider]  benztropine (COGENTIN) 1 MG tablet Take 1 mg by mouth 2 (two) times daily.    [provider]  cetirizine (ZYRTEC) 10 MG tablet Take 10 mg by mouth daily.    [provider]  cloZAPine (CLOZARIL) 25 MG tablet Take 1 tablet (25 mg total) by mouth 2 (two) times daily for 3 days, THEN 2 tablets (50 mg total) 2 (two) times daily. Needs prompt follow up with outpatient psychiatry and intermittent orthostatic checks. 11/03/22 12/06/22  Zigmund Daniel., MD  dapagliflozin propanediol (FARXIGA) 10 MG TABS tablet Take 10 mg by mouth daily after breakfast.    [provider]  divalproex (DEPAKOTE ER) 500 MG 24 hr tablet Take 500 mg by mouth 2 (two) times daily.     [provider]  ergocalciferol (VITAMIN D2) 1.25 MG (50000 UT) capsule Take 50,000 Units by mouth once a week. Thursday    [provider]  hydrOXYzine (ATARAX) 25 MG tablet Take 25 mg by mouth 2 (two) times daily as needed for anxiety.    [provider]  levETIRAcetam (KEPPRA) 500 MG tablet Take 500 mg by mouth 2 (two) times daily.    [provider]  magnesium oxide (MAG-OX) 400 (240 Mg) MG tablet Take 1 tablet (400 mg total) by mouth daily. 10/14/20   Pokhrel, Rebekah Chesterfield, MD  metFORMIN (GLUCOPHAGE) 1000 MG tablet Take 1,000 mg by mouth 2 (two) times daily with a meal.    [provider]  mirtazapine (REMERON SOL-TAB) 15 MG disintegrating tablet Take 15 mg by mouth at bedtime.    [provider]  QUEtiapine (SEROQUEL) 200 MG tablet Take 1 tablet (200 mg total) by mouth at bedtime. 08/23/22   Hollice Espy, MD  QUEtiapine (SEROQUEL) 50 MG tablet Take 1 tablet (50 mg total) by mouth See admin instructions. Take 50 mg (1 tablet) by mouth every morning and at 3 pm. 08/23/22   Hollice Espy, MD  traZODone (DESYREL) 100 MG tablet Take 1 tablet (100 mg total) by mouth at bedtime. 08/23/22   Hollice Espy, MD  zolpidem (AMBIEN) 5 MG tablet Take 1 tablet (5 mg total) by mouth at bedtime as needed  for sleep. 08/23/22   Hollice Espy, MD      Allergies    Cogentin [benztropine], Penicillins, and Prolixin [fluphenazine]    Review of Systems   Review of Systems  All other systems reviewed and are negative.   Physical Exam Updated Vital Signs BP (!) 107/90   Pulse (!) 106   Temp 98 F (36.7 C) (Axillary)   Resp 16   Ht 5\' 6"  (1.676 m)   Wt 55.9 kg   SpO2 96%   BMI 19.88 kg/m  Physical Exam Vitals and nursing note reviewed.  Constitutional:      General: Ronald Roman is not in acute distress.    Appearance: Ronald Roman is well-developed.     Comments: Patient sitting wheelchair appears to be in no acute discomfort.  HENT:     Head: Normocephalic  and atraumatic.     Comments: No scalp tenderness no signs of trauma. Eyes:     Extraocular Movements: Extraocular movements intact.     Conjunctiva/sclera: Conjunctivae normal.     Pupils: Pupils are equal, round, and reactive to light.  Neck:     Comments: No midline spine tenderness Cardiovascular:     Rate and Rhythm: Normal rate and regular rhythm.  Pulmonary:     Effort: Pulmonary effort is normal.     Breath sounds: Normal breath sounds.  Abdominal:     Palpations: Abdomen is soft.     Tenderness: There is no abdominal tenderness.  Musculoskeletal:        General: Normal range of motion.     Cervical back: Normal range of motion and neck supple.     Comments: Equal strength bilateral upper extremities  Skin:    Findings: No rash.  Neurological:     Mental Status: Ronald Roman is alert.     ED Results / Procedures / Treatments   Labs (all labs ordered are listed, but only abnormal results are displayed) Labs Reviewed - No data to display  EKG None  Radiology DG Lumbar Spine Complete  Result Date: 11/19/2022 CLINICAL DATA:  Fall.  Back pain.  Sacral pain. EXAM: LUMBAR SPINE - COMPLETE 4+ VIEW COMPARISON:  CT abdomen and pelvis 10/31/2022 FINDINGS: There are 5 non-rib-bearing lumbar-type vertebral bodies. There is approximately 5 mm grade 1 anterolisthesis of L4 on L5, similar to prior. Minimal retrolisthesis of L3 on L4. Vertebral body heights are maintained. Moderate posterior L5-S1 disc space narrowing and moderate L5-S1 endplate sclerosis, similar to prior. IMPRESSION: 1. No evidence of acute fracture. 2. Grade 1 anterolisthesis of L4 on L5, similar to prior. 3. Moderate L5-S1 degenerative disc changes, similar to prior. Electronically Signed   By: Neita Garnet M.D.   On: 11/19/2022 20:01   CLINICAL DATA: Fall. Back pain. Sacral pain.  EXAM: LUMBAR SPINE - COMPLETE 4+ VIEW  COMPARISON: CT abdomen and pelvis 10/31/2022  FINDINGS: There are 5 non-rib-bearing lumbar-type  vertebral bodies. There is approximately 5 mm grade 1 anterolisthesis of L4 on L5, similar to prior. Minimal retrolisthesis of L3 on L4.  Vertebral body heights are maintained.  Moderate posterior L5-S1 disc space narrowing and moderate L5-S1 endplate sclerosis, similar to prior.  IMPRESSION: 1. No evidence of acute fracture. 2. Grade 1 anterolisthesis of L4 on L5, similar to prior. 3. Moderate L5-S1 degenerative disc changes, similar to prior.   Electronically Signed By: Neita Garnet M.D. On: 11/19/2022 20:01   Procedures Procedures    Medications Ordered in ED Medications - No data to display  ED Course/  Medical Decision Making/ A&P                             Medical Decision Making Amount and/or Complexity of Data Reviewed Radiology: ordered.   BP (!) 107/90   Pulse (!) 106   Temp 98 F (36.7 C) (Axillary)   Resp 16   Ht 5\' 6"  (1.676 m)   Wt 55.9 kg   SpO2 96%   BMI 19.88 kg/m   54:38 PM  62 year old male significant history for schizophrenia, diabetes, recurrent falls, brought here via EMS from assisted living facility for evaluation of a fall.  Per EMS note, patient had a fall that was witnessed by staff.  It seems to be landing on his buttock but no loss of consciousness.  Staff report patient is at his baseline.  No other complaint.  At this time patient and does not have any significant complaints.  Did endorse mild headache and left shoulder pain.  No complaints of chest pain trouble breathing abdominal pain back pain or urinary symptoms.  Overall exam is reassuring no significant point tenderness noted.  Vital signs notable for heart rate of 106 however on my exam Ronald Roman is not tachycardic.  HPI -Labs considered but not performed.  Pt without concerning finding on exam to warrant admission -EKG interpreted by me showing sinus tachycardia -imaging independently viewed and interpreted by me and I agree with radiologist's interpretation.  Result remarkable for  lspine Xray without acute changes -DDx: strain, sprain, contusion, fx, dislocation -treatment include tylenol or ibuprofen which was offered but pt declined -PCP office notes or outside notes reviewed -Escalation to admission/observation considered: patients feels much better, is comfortable with discharge, and will follow up with PCP -Prescription medication considered, patient comfortable with OTC medication -Social Determinant of Health considered which includes tobacco use, depression  9:31 PM L-spine x-ray without concerning finding, patient legal guardian was contacted and made aware that patient is stable to be discharged back to facility.         Final Clinical Impression(s) / ED Diagnoses Final diagnoses:  Fall in home, initial encounter    Rx / DC Orders ED Discharge Orders     None         Fayrene Helper, Cordelia Poche 11/19/22 2131    Gwyneth Sprout, MD 11/19/22 2344

## 2022-11-19 NOTE — Discharge Instructions (Signed)
You have been evaluated for your fall.  X-ray of your lower back obtained today did not show any concerning finding.  You may take over-the-counter Tylenol or ibuprofen as needed for pain.  Follow-up with your doctor for further care.

## 2022-11-19 NOTE — ED Triage Notes (Signed)
BIBA from facility- [facility]. States pt has a hx of falling. Larey Seat today, landed on butt. No LOC. Fall witnessed by staff. Patient at baseline mentation, altered. Complaining of back pain, sacral area. Denies head injury or LOC. No other complaints reported. No obvious injuries.

## 2022-11-19 NOTE — ED Notes (Signed)
Attempted to call [facility], went to [facility].

## 2022-12-10 ENCOUNTER — Emergency Department (HOSPITAL_COMMUNITY): Payer: MEDICAID

## 2022-12-10 ENCOUNTER — Encounter (HOSPITAL_COMMUNITY): Payer: Self-pay

## 2022-12-10 ENCOUNTER — Other Ambulatory Visit: Payer: Self-pay

## 2022-12-10 ENCOUNTER — Inpatient Hospital Stay (HOSPITAL_COMMUNITY)
Admission: EM | Admit: 2022-12-10 | Discharge: 2022-12-23 | DRG: 682 | Disposition: E | Payer: MEDICAID | Source: Skilled Nursing Facility | Attending: Critical Care Medicine | Admitting: Critical Care Medicine

## 2022-12-10 DIAGNOSIS — E440 Moderate protein-calorie malnutrition: Secondary | ICD-10-CM | POA: Insufficient documentation

## 2022-12-10 DIAGNOSIS — R64 Cachexia: Secondary | ICD-10-CM | POA: Diagnosis present

## 2022-12-10 DIAGNOSIS — E872 Acidosis, unspecified: Secondary | ICD-10-CM | POA: Diagnosis present

## 2022-12-10 DIAGNOSIS — R270 Ataxia, unspecified: Secondary | ICD-10-CM | POA: Diagnosis present

## 2022-12-10 DIAGNOSIS — Z7984 Long term (current) use of oral hypoglycemic drugs: Secondary | ICD-10-CM

## 2022-12-10 DIAGNOSIS — A4151 Sepsis due to Escherichia coli [E. coli]: Secondary | ICD-10-CM | POA: Diagnosis not present

## 2022-12-10 DIAGNOSIS — K219 Gastro-esophageal reflux disease without esophagitis: Secondary | ICD-10-CM | POA: Diagnosis present

## 2022-12-10 DIAGNOSIS — D649 Anemia, unspecified: Secondary | ICD-10-CM | POA: Diagnosis present

## 2022-12-10 DIAGNOSIS — Z781 Physical restraint status: Secondary | ICD-10-CM

## 2022-12-10 DIAGNOSIS — F17210 Nicotine dependence, cigarettes, uncomplicated: Secondary | ICD-10-CM | POA: Diagnosis present

## 2022-12-10 DIAGNOSIS — E119 Type 2 diabetes mellitus without complications: Secondary | ICD-10-CM

## 2022-12-10 DIAGNOSIS — K567 Ileus, unspecified: Secondary | ICD-10-CM | POA: Diagnosis present

## 2022-12-10 DIAGNOSIS — N179 Acute kidney failure, unspecified: Principal | ICD-10-CM | POA: Diagnosis present

## 2022-12-10 DIAGNOSIS — Y95 Nosocomial condition: Secondary | ICD-10-CM | POA: Diagnosis not present

## 2022-12-10 DIAGNOSIS — R4182 Altered mental status, unspecified: Secondary | ICD-10-CM | POA: Diagnosis present

## 2022-12-10 DIAGNOSIS — G9341 Metabolic encephalopathy: Secondary | ICD-10-CM | POA: Diagnosis present

## 2022-12-10 DIAGNOSIS — J690 Pneumonitis due to inhalation of food and vomit: Secondary | ICD-10-CM | POA: Diagnosis not present

## 2022-12-10 DIAGNOSIS — F200 Paranoid schizophrenia: Secondary | ICD-10-CM | POA: Diagnosis present

## 2022-12-10 DIAGNOSIS — J189 Pneumonia, unspecified organism: Secondary | ICD-10-CM | POA: Diagnosis not present

## 2022-12-10 DIAGNOSIS — I10 Essential (primary) hypertension: Secondary | ICD-10-CM | POA: Diagnosis present

## 2022-12-10 DIAGNOSIS — Z681 Body mass index (BMI) 19 or less, adult: Secondary | ICD-10-CM

## 2022-12-10 DIAGNOSIS — R001 Bradycardia, unspecified: Secondary | ICD-10-CM | POA: Diagnosis not present

## 2022-12-10 DIAGNOSIS — Z880 Allergy status to penicillin: Secondary | ICD-10-CM

## 2022-12-10 DIAGNOSIS — Z9911 Dependence on respirator [ventilator] status: Secondary | ICD-10-CM

## 2022-12-10 DIAGNOSIS — R32 Unspecified urinary incontinence: Secondary | ICD-10-CM | POA: Diagnosis present

## 2022-12-10 DIAGNOSIS — G249 Dystonia, unspecified: Secondary | ICD-10-CM | POA: Diagnosis present

## 2022-12-10 DIAGNOSIS — E785 Hyperlipidemia, unspecified: Secondary | ICD-10-CM | POA: Diagnosis present

## 2022-12-10 DIAGNOSIS — E1165 Type 2 diabetes mellitus with hyperglycemia: Secondary | ICD-10-CM | POA: Diagnosis present

## 2022-12-10 DIAGNOSIS — Z79899 Other long term (current) drug therapy: Secondary | ICD-10-CM

## 2022-12-10 DIAGNOSIS — R6521 Severe sepsis with septic shock: Secondary | ICD-10-CM | POA: Diagnosis not present

## 2022-12-10 DIAGNOSIS — R451 Restlessness and agitation: Secondary | ICD-10-CM | POA: Diagnosis not present

## 2022-12-10 DIAGNOSIS — Z888 Allergy status to other drugs, medicaments and biological substances status: Secondary | ICD-10-CM

## 2022-12-10 DIAGNOSIS — K5900 Constipation, unspecified: Secondary | ICD-10-CM | POA: Diagnosis present

## 2022-12-10 DIAGNOSIS — I468 Cardiac arrest due to other underlying condition: Secondary | ICD-10-CM | POA: Diagnosis not present

## 2022-12-10 DIAGNOSIS — Z87442 Personal history of urinary calculi: Secondary | ICD-10-CM

## 2022-12-10 DIAGNOSIS — J80 Acute respiratory distress syndrome: Secondary | ICD-10-CM

## 2022-12-10 DIAGNOSIS — G40909 Epilepsy, unspecified, not intractable, without status epilepticus: Secondary | ICD-10-CM

## 2022-12-10 DIAGNOSIS — Z66 Do not resuscitate: Secondary | ICD-10-CM | POA: Diagnosis not present

## 2022-12-10 LAB — COMPREHENSIVE METABOLIC PANEL
ALT: 14 U/L (ref 0–44)
AST: 25 U/L (ref 15–41)
Albumin: 4 g/dL (ref 3.5–5.0)
Alkaline Phosphatase: 85 U/L (ref 38–126)
Anion gap: 10 (ref 5–15)
BUN: 50 mg/dL — ABNORMAL HIGH (ref 8–23)
CO2: 23 mmol/L (ref 22–32)
Calcium: 9.6 mg/dL (ref 8.9–10.3)
Chloride: 102 mmol/L (ref 98–111)
Creatinine, Ser: 2.07 mg/dL — ABNORMAL HIGH (ref 0.61–1.24)
GFR, Estimated: 36 mL/min — ABNORMAL LOW (ref >60)
Glucose, Bld: 79 mg/dL (ref 70–99)
Potassium: 4.9 mmol/L (ref 3.5–5.1)
Sodium: 135 mmol/L (ref 135–145)
Total Bilirubin: 0.7 mg/dL (ref 0.3–1.2)
Total Protein: 8 g/dL (ref 6.5–8.1)

## 2022-12-10 LAB — CBC WITH DIFFERENTIAL/PLATELET
Abs Immature Granulocytes: 0.05 10*3/uL (ref 0.00–0.07)
Basophils Absolute: 0 10*3/uL (ref 0.0–0.1)
Basophils Relative: 0 %
Eosinophils Absolute: 0.1 10*3/uL (ref 0.0–0.5)
Eosinophils Relative: 1 %
HCT: 31 % — ABNORMAL LOW (ref 39.0–52.0)
Hemoglobin: 10.1 g/dL — ABNORMAL LOW (ref 13.0–17.0)
Immature Granulocytes: 1 %
Lymphocytes Relative: 16 %
Lymphs Abs: 1.4 10*3/uL (ref 0.7–4.0)
MCH: 26.6 pg (ref 26.0–34.0)
MCHC: 32.6 g/dL (ref 30.0–36.0)
MCV: 81.6 fL (ref 80.0–100.0)
Monocytes Absolute: 0.8 10*3/uL (ref 0.1–1.0)
Monocytes Relative: 9 %
Neutro Abs: 6.8 10*3/uL (ref 1.7–7.7)
Neutrophils Relative %: 73 %
Platelets: 178 10*3/uL (ref 150–400)
RBC: 3.8 MIL/uL — ABNORMAL LOW (ref 4.22–5.81)
RDW: 14.4 % (ref 11.5–15.5)
WBC: 9.2 10*3/uL (ref 4.0–10.5)
nRBC: 0 % (ref 0.0–0.2)

## 2022-12-10 LAB — URINALYSIS, W/ REFLEX TO CULTURE (INFECTION SUSPECTED)
Bacteria, UA: NONE SEEN
Bilirubin Urine: NEGATIVE
Glucose, UA: >=500 mg/dL — AB
Hgb urine dipstick: NEGATIVE
Ketones, ur: 5 mg/dL — AB
Leukocytes,Ua: NEGATIVE
Nitrite: NEGATIVE
Protein, ur: 30 mg/dL — AB
Specific Gravity, Urine: 1.016 (ref 1.005–1.030)
pH: 5 (ref 5.0–8.0)

## 2022-12-10 LAB — RAPID URINE DRUG SCREEN, HOSP PERFORMED
Amphetamines: NOT DETECTED
Barbiturates: NOT DETECTED
Benzodiazepines: NOT DETECTED
Cocaine: NOT DETECTED
Opiates: NOT DETECTED
Tetrahydrocannabinol: NOT DETECTED

## 2022-12-10 LAB — SALICYLATE LEVEL: Salicylate Lvl: <7 mg/dL — ABNORMAL LOW (ref 7.0–30.0)

## 2022-12-10 LAB — ETHANOL: Alcohol, Ethyl (B): <10 mg/dL (ref <10)

## 2022-12-10 LAB — AMMONIA: Ammonia: 19 umol/L (ref 9–35)

## 2022-12-10 LAB — CBG MONITORING, ED: Glucose-Capillary: 79 mg/dL (ref 70–99)

## 2022-12-10 LAB — ACETAMINOPHEN LEVEL: Acetaminophen (Tylenol), Serum: <10 ug/mL — ABNORMAL LOW (ref 10–30)

## 2022-12-10 LAB — LIPASE, BLOOD: Lipase: 27 U/L (ref 11–51)

## 2022-12-10 MED ORDER — LACTATED RINGERS IV BOLUS
1000.0000 mL | Freq: Once | INTRAVENOUS | Status: AC
  Administered 2022-12-10: 1000 mL via INTRAVENOUS

## 2022-12-10 NOTE — Hospital Course (Signed)
Ronald Roman is a 62 y.o. male with medical history significant for paranoid schizophrenia, T2DM, HTN, seizure disorder who is admitted with AKI and change in mental status.

## 2022-12-10 NOTE — ED Provider Notes (Signed)
[place] EMERGENCY DEPARTMENT AT [facility] Provider Note   CSN: [id number] Arrival date & time: 12/01/2022  1927     History  Chief Complaint  Patient presents with   Psychiatric Evaluation    Behavioral Changes    Ronald Roman is a 62 y.o. male.  HPI 62 year old male history of schizophrenia, hypertension, type 2 diabetes, seizures.  Patient reportedly presents to behavior changes and hallucinations.  Patient here provides very limited history.  He knows his name and knows he is in the hospital, but is unsure why he is here.  Endorses hearing voices but denies any command hallucinations or SI or HI.  He denies any pain including headache, neck pain, chest pain, back pain, abdominal pain.  History otherwise limited.  Discussed with legal guardian Jewel.  Reports patient talking to himself, incontinence, trouble walking.  Acute change today.  [facility].  This is an assisted living facility, where they help him with his medications.  They told the guardian that he was at his baseline yesterday and she last saw him about a week ago when he was okay.  Normally can hold a conversation, walk, feed himself.     Home Medications Prior to Admission medications   Medication Sig Start Date End Date Taking? Authorizing Provider  amantadine (SYMMETREL) 100 MG capsule Take 100 mg by mouth 2 (two) times daily.   Yes [provider]  benztropine (COGENTIN) 1 MG tablet Take 1 mg by mouth 2 (two) times daily.   Yes [provider]  cetirizine (ZYRTEC) 10 MG tablet Take 10 mg by mouth daily.   Yes [provider]  cloZAPine (CLOZARIL) 25 MG tablet Take 1 tablet (25 mg total) by mouth 2 (two) times daily for 3 days, THEN 2 tablets (50 mg total) 2 (two) times daily. Needs prompt follow up with outpatient psychiatry and intermittent orthostatic checks. Patient taking differently: Take 2 tablets by mouth twice a day 11/03/22 12/09/2022 Yes Zigmund Daniel., MD  dapagliflozin propanediol (FARXIGA) 10 MG TABS tablet Take 10 mg by mouth daily after breakfast.   Yes [provider]  divalproex (DEPAKOTE ER) 500 MG 24 hr tablet Take 500 mg by mouth 2 (two) times daily.   Yes [provider]  ergocalciferol (VITAMIN D2) 1.25 MG (50000 UT) capsule Take 50,000 Units by mouth once a week. Thursday   Yes [provider]  levETIRAcetam (KEPPRA) 500 MG tablet Take 500 mg by mouth 2 (two) times daily.   Yes [provider]  magnesium oxide (MAG-OX) 400 (240 Mg) MG tablet Take 1 tablet (400 mg total) by mouth daily. 10/14/20  Yes Pokhrel, Laxman, MD  metFORMIN (GLUCOPHAGE) 1000 MG tablet Take 1,000 mg by mouth 2 (two) times daily with a meal.   Yes [provider]  mirtazapine (REMERON) 15 MG tablet Take 15 mg by mouth at bedtime.   Yes [provider]  polyethylene glycol (MIRALAX / GLYCOLAX) 17 g packet Take 17 g by mouth 2 (two) times daily.   Yes [provider]  QUEtiapine (SEROQUEL) 200 MG tablet Take 1 tablet (200 mg total) by mouth at bedtime. 08/23/22  Yes Hollice Espy, MD  QUEtiapine (SEROQUEL) 50 MG tablet Take 1 tablet (50 mg total) by mouth See admin instructions. Take 50 mg (1 tablet) by mouth every morning and at 3 pm. 08/23/22  Yes Hollice Espy, MD  traZODone (DESYREL) 100 MG tablet Take 1 tablet (100 mg  total) by mouth at bedtime. 08/23/22  Yes Hollice Espy, MD  hydrOXYzine (ATARAX) 25 MG tablet Take 25 mg by mouth 2 (two) times daily as needed for anxiety.    [provider]  zolpidem (AMBIEN) 5 MG tablet Take 1 tablet (5 mg total) by mouth at bedtime as needed for sleep. 08/23/22   Hollice Espy, MD      Allergies    Cogentin [benztropine], Penicillins, and Prolixin [fluphenazine]    Review of Systems   Review of Systems  Unable to perform ROS: Mental status change    Physical Exam Updated Vital Signs BP (!) 142/87   Pulse 90   Temp 98 F  (36.7 C) (Oral)   Resp 18   SpO2 97%  Physical Exam Vitals and nursing note reviewed.  Constitutional:      Appearance: He is well-developed.     Comments: Patient is awake, intermittently rolling around in bed.  HENT:     Head: Normocephalic and atraumatic.     Nose: Nose normal.     Mouth/Throat:     Mouth: Mucous membranes are moist.     Pharynx: Oropharynx is clear.  Eyes:     Extraocular Movements: Extraocular movements intact.     Conjunctiva/sclera: Conjunctivae normal.     Pupils: Pupils are equal, round, and reactive to light.  Cardiovascular:     Rate and Rhythm: Normal rate and regular rhythm.     Heart sounds: No murmur heard. Pulmonary:     Effort: Pulmonary effort is normal. No respiratory distress.     Breath sounds: Normal breath sounds.  Abdominal:     Palpations: Abdomen is soft.     Tenderness: There is no abdominal tenderness. There is no guarding or rebound.  Musculoskeletal:        General: No swelling.     Cervical back: Normal range of motion and neck supple. No rigidity or tenderness.     Right lower leg: No edema.     Left lower leg: No edema.  Skin:    General: Skin is warm and dry.     Capillary Refill: Capillary refill takes less than 2 seconds.  Neurological:     Mental Status: He is alert.     Comments: Patient is awake.  Control and instrumentation engineer.  He is oriented to name, thinks he is at [facility].  Disoriented to time.  Endorses hearing some voices but denies command hallucinations, SI, HI.  He is able to move all extremities antigravity with good strength.  No facial asymmetry or aphasia.     ED Results / Procedures / Treatments   Labs (all labs ordered are listed, but only abnormal results are displayed) Labs Reviewed  ACETAMINOPHEN LEVEL - Abnormal; Notable for the following components:      Result Value   Acetaminophen (Tylenol), Serum <10 (*)    All other components within normal limits  COMPREHENSIVE METABOLIC PANEL - Abnormal;  Notable for the following components:   BUN 50 (*)    Creatinine, Ser 2.07 (*)    GFR, Estimated 36 (*)    All other components within normal limits  SALICYLATE LEVEL - Abnormal; Notable for the following components:   Salicylate Lvl <7.0 (*)    All other components within normal limits  CBC WITH DIFFERENTIAL/PLATELET - Abnormal; Notable for the following components:   RBC 3.80 (*)    Hemoglobin 10.1 (*)    HCT 31.0 (*)    All other components within  normal limits  URINALYSIS, W/ REFLEX TO CULTURE (INFECTION SUSPECTED) - Abnormal; Notable for the following components:   Glucose, UA >=500 (*)    Ketones, ur 5 (*)    Protein, ur 30 (*)    All other components within normal limits  ETHANOL  LIPASE, BLOOD  AMMONIA  RAPID URINE DRUG SCREEN, HOSP PERFORMED  CBG MONITORING, ED    EKG EKG Interpretation Date/Time:  Friday 2022-12-13 20:22:22 EDT Ventricular Rate:  88 PR Interval:  165 QRS Duration:  93 QT Interval:  432 QTC Calculation: 523 R Axis:   87  Text Interpretation: Sinus rhythm Borderline right axis deviation Nonspecific T abnormalities, lateral leads Prolonged QT interval Confirmed by Fulton Reek [phone]) on 13-Dec-2022 8:35:45 PM  Radiology CT Head Wo Contrast  Result Date: 13-Dec-2022 CLINICAL DATA:  Mental status change, unknown cause. EXAM: CT HEAD WITHOUT CONTRAST TECHNIQUE: Contiguous axial images were obtained from the base of the skull through the vertex without intravenous contrast. RADIATION DOSE REDUCTION: This exam was performed according to the departmental dose-optimization program which includes automated exposure control, adjustment of the mA and/or kV according to patient size and/or use of iterative reconstruction technique. COMPARISON:  None Available. FINDINGS: Brain: No acute hemorrhage, midline shift or mass effect. No extra-axial fluid collection. Diffuse atrophy is noted. Mild periventricular white matter hypodensities are noted bilaterally. No  hydrocephalus. Vascular: No hyperdense vessel or unexpected calcification. Skull: No acute fracture. Sinuses/Orbits: No acute finding. Other: None. IMPRESSION: 1. No acute intracranial process. 2. Atrophy with chronic microvascular ischemic changes. Electronically Signed   By: Thornell Sartorius M.D.   On: 12/13/2022 20:49   DG Chest 1 View  Result Date: 2022-12-13 CLINICAL DATA:  Altered mental status EXAM: CHEST  1 VIEW COMPARISON:  08/16/2022 FINDINGS: Shallow inspiration. Heart size and pulmonary vascularity are normal. Lungs are clear. No pleural effusions. No pneumothorax. Mediastinal contours appear intact. IMPRESSION: No active disease. Electronically Signed   By: Burman Nieves M.D.   On: 12-13-22 20:48    Procedures Procedures    Medications Ordered in ED Medications  lactated ringers bolus 1,000 mL (1,000 mLs Intravenous New Bag/Given 12/13/22 2216)    ED Course/ Medical Decision Making/ A&P                             Medical Decision Making Amount and/or Complexity of Data Reviewed Labs: ordered. Radiology: ordered.  Risk Decision regarding hospitalization.   Medical Decision Making:   JAHKARI MACLIN is a 62 y.o. male who presented to the ED today with change in mentation and hallucinations.  Vital signs reviewed notable for tachycardia although on my evaluation he is not tachycardic.  On exam he is confused, endorses some hallucinations.  Collateral from legal guardian is concerning for change in mentation today.  Differential including stroke although less likely given no deficits.  Also consider possible infectious cause, metabolic abnormality, decompensated schizophrenia.  However degree of his mental status seems less consistent with purely psychiatric cause.   Additional history discussed with patient's family/caregivers.  Patient placed on continuous vitals and telemetry monitoring while in ED which was reviewed periodically.  Reviewed and confirmed nursing  documentation for past medical history, family history, social history.  Initial Study Results:   Laboratory  All laboratory results reviewed.  Labs notable for AKI, elevated BUN, mild anemia.  EKG EKG was reviewed independently.  Prolonged QTc. ST segments without concerns for elevations.  Radiology:  All images reviewed independently.  Chest x-ray, CT head unremarkable.  Agree with radiology report at this time.    Reassessment and Plan:   On reassessment he remains confused and encephalopathic.  His vital signs are stable.  His lab work is concerning for AKI with elevated uremia which could be contributing to his mental status.  I do not see signs of infection.  Given concern for AKI with abrupt mental status change, will plan for admission to hospitalist for further management.   Patient's presentation is most consistent with acute presentation with potential threat to life or bodily function.           Final Clinical Impression(s) / ED Diagnoses Final diagnoses:  AKI (acute kidney injury) [facility])    Rx / DC Orders ED Discharge Orders     None         Laurence Spates, MD 12/09/2022 2349

## 2022-12-10 NOTE — H&P (Signed)
History and Physical    Ronald Roman OZH:[id number] DOB: 1960-11-22 DOA: 12-26-2022  [facility]: [facility]  Patient coming from: [facility] assisted living  I have personally briefly reviewed patient's old medical records in [facility] Link  Chief Complaint: Altered mental status  HPI: Ronald Roman is a 62 y.o. male with medical history significant for paranoid schizophrenia, T2DM, HTN, seizure disorder who presented to the ED from [facility] assisted living for evaluation of change in mental status.***  ***  ED Course  Labs/Imaging on admission: I have personally reviewed following labs and imaging studies.  Initial vitals showed BP 133/91, pulse 113, RR 15, temp 97.9 F, SpO2 99% on room air.  Labs show sodium 135, potassium 4.9, bicarb 23, BUN 50, creatinine 2.07, serum glucose 79, LFTs within normal limits, ammonia 19, WBC 9.2, hemoglobin 10.1, platelets 178,000, lipase 27.  Serum ethanol, acetaminophen, and salicylate levels undetectable.  UDS negative.  UA negative for UTI.  CT head without contrast negative for acute intracranial process.  Atrophy with chronic microvascular ischemic changes noted.  Portable chest x-ray negative for focal consolidation, edema, effusion.  Patient was given 1 L LR.  The hospitalist service was consulted to admit for further evaluation and management.  Review of Systems:  ***All systems reviewed and are negative except as documented in history of present illness above.   Past Medical History:  Diagnosis Date   Diabetes mellitus without complication (HCC)    GERD (gastroesophageal reflux disease)    Hyperlipidemia    Hypertension    Paranoid schizophrenia (HCC)    Seizures (HCC)    Sleep apnea    Uric acid nephrolithiasis     Past Surgical History:  Procedure Laterality Date   ESOPHAGOGASTRODUODENOSCOPY (EGD) WITH PROPOFOL N/A 08/21/2022   Procedure: ESOPHAGOGASTRODUODENOSCOPY (EGD) WITH PROPOFOL;  Surgeon:  Beverley Fiedler, MD;  Location: [facility] ENDOSCOPY;  Service: Gastroenterology;  Laterality: N/A;    Social History:  reports that he has quit smoking. His smoking use included cigarettes. He has never used smokeless tobacco. He reports that he does not drink alcohol and does not use drugs.  Allergies  Allergen Reactions   Cogentin [Benztropine] Other (See Comments)    Not documented on the [facility]   Penicillins Other (See Comments)    Not documented on the [facility]    Prolixin [Fluphenazine] Other (See Comments)    Not documented on the [facility]     History reviewed. No pertinent family history.   Prior to Admission medications   Medication Sig Start Date End Date Taking? Authorizing Provider  amantadine (SYMMETREL) 100 MG capsule Take 100 mg by mouth 2 (two) times daily.   Yes [provider]  benztropine (COGENTIN) 1 MG tablet Take 1 mg by mouth 2 (two) times daily.   Yes [provider]  cetirizine (ZYRTEC) 10 MG tablet Take 10 mg by mouth daily.   Yes [provider]  cloZAPine (CLOZARIL) 25 MG tablet Take 1 tablet (25 mg total) by mouth 2 (two) times daily for 3 days, THEN 2 tablets (50 mg total) 2 (two) times daily. Needs prompt follow up with outpatient psychiatry and intermittent orthostatic checks. Patient taking differently: Take 2 tablets by mouth twice a day 11/03/22 Dec 26, 2022 Yes Zigmund Daniel., MD  dapagliflozin propanediol (FARXIGA) 10 MG TABS tablet Take 10 mg by mouth daily after breakfast.   Yes [provider]  divalproex (DEPAKOTE ER) 500 MG 24 hr tablet Take 500 mg by mouth  2 (two) times daily.   Yes [provider]  ergocalciferol (VITAMIN D2) 1.25 MG (50000 UT) capsule Take 50,000 Units by mouth once a week. Thursday   Yes [provider]  levETIRAcetam (KEPPRA) 500 MG tablet Take 500 mg by mouth 2 (two) times daily.   Yes [provider]  magnesium oxide (MAG-OX) 400 (240 Mg) MG tablet Take 1 tablet (400 mg total)  by mouth daily. 10/14/20  Yes Pokhrel, Laxman, MD  metFORMIN (GLUCOPHAGE) 1000 MG tablet Take 1,000 mg by mouth 2 (two) times daily with a meal.   Yes [provider]  mirtazapine (REMERON) 15 MG tablet Take 15 mg by mouth at bedtime.   Yes [provider]  polyethylene glycol (MIRALAX / GLYCOLAX) 17 g packet Take 17 g by mouth 2 (two) times daily.   Yes [provider]  QUEtiapine (SEROQUEL) 200 MG tablet Take 1 tablet (200 mg total) by mouth at bedtime. 08/23/22  Yes Hollice Espy, MD  QUEtiapine (SEROQUEL) 50 MG tablet Take 1 tablet (50 mg total) by mouth See admin instructions. Take 50 mg (1 tablet) by mouth every morning and at 3 pm. 08/23/22  Yes Hollice Espy, MD  traZODone (DESYREL) 100 MG tablet Take 1 tablet (100 mg total) by mouth at bedtime. 08/23/22  Yes Hollice Espy, MD  hydrOXYzine (ATARAX) 25 MG tablet Take 25 mg by mouth 2 (two) times daily as needed for anxiety.    [provider]  zolpidem (AMBIEN) 5 MG tablet Take 1 tablet (5 mg total) by mouth at bedtime as needed for sleep. 08/23/22   Hollice Espy, MD    Physical Exam: Vitals:   12-20-22 2130 12-20-2022 2145 12/20/2022 2215 20-Dec-2022 2328  BP: 128/81 129/84 (!) 142/87   Pulse: 81 80 90   Resp:   18   Temp:    98 F (36.7 C)  TempSrc:    Oral  SpO2: 93% 100% 97%    *** Constitutional: NAD, calm, comfortable Eyes: PERRL, lids and conjunctivae normal ENMT: Mucous membranes are moist. Posterior pharynx clear of any exudate or lesions.Normal dentition.  Neck: normal, supple, no masses. Respiratory: clear to auscultation bilaterally, no wheezing, no crackles. Normal respiratory effort. No accessory muscle use.  Cardiovascular: Regular rate and rhythm, no murmurs / rubs / gallops. No extremity edema. 2+ pedal pulses. Abdomen: no tenderness, no masses palpated. No hepatosplenomegaly. Bowel sounds positive.  Musculoskeletal: no clubbing / cyanosis. No joint deformity upper and  lower extremities. Good ROM, no contractures. Normal muscle tone.  Skin: no rashes, lesions, ulcers. No induration Neurologic: CN 2-12 grossly intact. Sensation intact. Strength 5/5 in all 4.  Psychiatric: Normal judgment and insight. Alert and oriented x 3. Normal mood.   EKG: Personally reviewed. Sinus rhythm, rate 88, QTc 523.  QTc more prolonged when compared to previous.  Assessment/Plan Principal Problem:   Acute kidney injury (HCC) Active Problems:   AMS (altered mental status)   Diabetes mellitus without complication (HCC)   Hypertension   Paranoid schizophrenia (HCC)   Seizure disorder (HCC)   Ronald Roman is a 62 y.o. male with medical history significant for paranoid schizophrenia, T2DM, HTN, seizure disorder who is admitted with AKI and change in mental status. *** Assessment and Plan: Acute kidney injury: BUN 20 with creatinine 2.07 on admission.  Baseline creatinine usually around 0.9-1.0.***  Altered mental status: Possibly due to azotemia versus paranoid schizophrenia.  Continue management as above.  UA negative for UTI.  CT head negative.  Paranoid schizophrenia: Continue clozapine, amantadine, benztropine, Seroquel, trazodone.  Will need psychiatry input if symptoms persist despite correction of renal dysfunction.***  Type 2 diabetes: Holding metformin and Farxiga with AKI.  Previous A1c levels have been <5.5 in 2022.  Recheck A1c, if remains well-controlled would consider discontinuing one of these meds given readmission for AKI.***  Hypertension: BP is stable.  Taken off lisinopril last month due to issues with orthostasis.  Continue to monitor.  Seizure disorder: No obvious seizure activity.  Continue Keppra and Depakote.***   DVT prophylaxis: ***  Code Status: ***  Family Communication: ***  Disposition Plan: From ALF and likely return to same facility pending clinical progress Consults called: None Severity of Illness: The appropriate patient  status for this patient is OBSERVATION. Observation status is judged to be reasonable and necessary in order to provide the required intensity of service to ensure the patient's safety. The patient's presenting symptoms, physical exam findings, and initial radiographic and laboratory data in the context of their medical condition is felt to place them at decreased risk for further clinical deterioration. Furthermore, it is anticipated that the patient will be medically stable for discharge from the hospital within 2 midnights of admission.   Darreld Mclean MD [facility]  If 7PM-7AM, please contact night-coverage [email or web address]  12/09/2022, 11:48 PM

## 2022-12-10 NOTE — ED Triage Notes (Signed)
BIBA - brought in from [facility]. Hx schizophrenia/dementia. Facility called for behavioral changes. Per EMS pt is hearing voices and very figidity today but more confused than normal. The social worker at the facility wanted him to come to the ER for evaluation. Pt will not hold still in triage.

## 2022-12-11 ENCOUNTER — Inpatient Hospital Stay (HOSPITAL_COMMUNITY): Payer: MEDICAID

## 2022-12-11 DIAGNOSIS — J690 Pneumonitis due to inhalation of food and vomit: Secondary | ICD-10-CM | POA: Diagnosis not present

## 2022-12-11 DIAGNOSIS — R4182 Altered mental status, unspecified: Secondary | ICD-10-CM | POA: Diagnosis present

## 2022-12-11 DIAGNOSIS — I468 Cardiac arrest due to other underlying condition: Secondary | ICD-10-CM | POA: Diagnosis not present

## 2022-12-11 DIAGNOSIS — E872 Acidosis, unspecified: Secondary | ICD-10-CM | POA: Diagnosis present

## 2022-12-11 DIAGNOSIS — D649 Anemia, unspecified: Secondary | ICD-10-CM | POA: Diagnosis present

## 2022-12-11 DIAGNOSIS — Z9911 Dependence on respirator [ventilator] status: Secondary | ICD-10-CM | POA: Diagnosis not present

## 2022-12-11 DIAGNOSIS — Z7984 Long term (current) use of oral hypoglycemic drugs: Secondary | ICD-10-CM | POA: Diagnosis not present

## 2022-12-11 DIAGNOSIS — R32 Unspecified urinary incontinence: Secondary | ICD-10-CM | POA: Diagnosis present

## 2022-12-11 DIAGNOSIS — G9341 Metabolic encephalopathy: Secondary | ICD-10-CM | POA: Diagnosis present

## 2022-12-11 DIAGNOSIS — F17210 Nicotine dependence, cigarettes, uncomplicated: Secondary | ICD-10-CM | POA: Diagnosis present

## 2022-12-11 DIAGNOSIS — R64 Cachexia: Secondary | ICD-10-CM | POA: Diagnosis present

## 2022-12-11 DIAGNOSIS — G40909 Epilepsy, unspecified, not intractable, without status epilepticus: Secondary | ICD-10-CM | POA: Diagnosis present

## 2022-12-11 DIAGNOSIS — J80 Acute respiratory distress syndrome: Secondary | ICD-10-CM | POA: Diagnosis not present

## 2022-12-11 DIAGNOSIS — Y95 Nosocomial condition: Secondary | ICD-10-CM | POA: Diagnosis not present

## 2022-12-11 DIAGNOSIS — I10 Essential (primary) hypertension: Secondary | ICD-10-CM | POA: Diagnosis present

## 2022-12-11 DIAGNOSIS — A4151 Sepsis due to Escherichia coli [E. coli]: Secondary | ICD-10-CM | POA: Diagnosis not present

## 2022-12-11 DIAGNOSIS — K567 Ileus, unspecified: Secondary | ICD-10-CM | POA: Diagnosis present

## 2022-12-11 DIAGNOSIS — K5903 Drug induced constipation: Secondary | ICD-10-CM | POA: Diagnosis not present

## 2022-12-11 DIAGNOSIS — R451 Restlessness and agitation: Secondary | ICD-10-CM | POA: Diagnosis not present

## 2022-12-11 DIAGNOSIS — F200 Paranoid schizophrenia: Secondary | ICD-10-CM | POA: Diagnosis present

## 2022-12-11 DIAGNOSIS — Z79899 Other long term (current) drug therapy: Secondary | ICD-10-CM | POA: Diagnosis not present

## 2022-12-11 DIAGNOSIS — E785 Hyperlipidemia, unspecified: Secondary | ICD-10-CM | POA: Diagnosis present

## 2022-12-11 DIAGNOSIS — E1165 Type 2 diabetes mellitus with hyperglycemia: Secondary | ICD-10-CM | POA: Diagnosis present

## 2022-12-11 DIAGNOSIS — Z66 Do not resuscitate: Secondary | ICD-10-CM | POA: Diagnosis not present

## 2022-12-11 DIAGNOSIS — J189 Pneumonia, unspecified organism: Secondary | ICD-10-CM | POA: Diagnosis not present

## 2022-12-11 DIAGNOSIS — E440 Moderate protein-calorie malnutrition: Secondary | ICD-10-CM | POA: Diagnosis present

## 2022-12-11 DIAGNOSIS — N179 Acute kidney failure, unspecified: Secondary | ICD-10-CM | POA: Diagnosis not present

## 2022-12-11 DIAGNOSIS — R6521 Severe sepsis with septic shock: Secondary | ICD-10-CM | POA: Diagnosis not present

## 2022-12-11 DIAGNOSIS — Z681 Body mass index (BMI) 19 or less, adult: Secondary | ICD-10-CM | POA: Diagnosis not present

## 2022-12-11 LAB — CBC
HCT: 29.6 % — ABNORMAL LOW (ref 39.0–52.0)
Hemoglobin: 9.5 g/dL — ABNORMAL LOW (ref 13.0–17.0)
MCH: 26.6 pg (ref 26.0–34.0)
MCHC: 32.1 g/dL (ref 30.0–36.0)
MCV: 82.9 fL (ref 80.0–100.0)
Platelets: 170 10*3/uL (ref 150–400)
RBC: 3.57 MIL/uL — ABNORMAL LOW (ref 4.22–5.81)
RDW: 14.6 % (ref 11.5–15.5)
WBC: 9.9 10*3/uL (ref 4.0–10.5)
nRBC: 0 % (ref 0.0–0.2)

## 2022-12-11 LAB — BASIC METABOLIC PANEL
Anion gap: 10 (ref 5–15)
BUN: 50 mg/dL — ABNORMAL HIGH (ref 8–23)
CO2: 21 mmol/L — ABNORMAL LOW (ref 22–32)
Calcium: 9.2 mg/dL (ref 8.9–10.3)
Chloride: 104 mmol/L (ref 98–111)
Creatinine, Ser: 1.92 mg/dL — ABNORMAL HIGH (ref 0.61–1.24)
GFR, Estimated: 39 mL/min — ABNORMAL LOW (ref >60)
Glucose, Bld: 90 mg/dL (ref 70–99)
Potassium: 4.9 mmol/L (ref 3.5–5.1)
Sodium: 135 mmol/L (ref 135–145)

## 2022-12-11 LAB — HEMOGLOBIN A1C
Hgb A1c MFr Bld: 6.1 % — ABNORMAL HIGH (ref 4.8–5.6)
Mean Plasma Glucose: 128.37 mg/dL

## 2022-12-11 MED ORDER — AMANTADINE HCL 100 MG PO CAPS
100.0000 mg | ORAL_CAPSULE | Freq: Two times a day (BID) | ORAL | Status: DC
  Administered 2022-12-11 – 2022-12-15 (×8): 100 mg via ORAL
  Filled 2022-12-11 (×10): qty 1

## 2022-12-11 MED ORDER — DIVALPROEX SODIUM ER 250 MG PO TB24
500.0000 mg | ORAL_TABLET | Freq: Two times a day (BID) | ORAL | Status: DC
  Administered 2022-12-11 – 2022-12-15 (×8): 500 mg via ORAL
  Filled 2022-12-11 (×8): qty 1

## 2022-12-11 MED ORDER — HEPARIN SODIUM (PORCINE) 5000 UNIT/ML IJ SOLN
5000.0000 [IU] | Freq: Three times a day (TID) | INTRAMUSCULAR | Status: DC
  Administered 2022-12-11 – 2022-12-15 (×14): 5000 [IU] via SUBCUTANEOUS
  Filled 2022-12-11 (×14): qty 1

## 2022-12-11 MED ORDER — SODIUM CHLORIDE 0.9 % IV SOLN: INTRAVENOUS | Status: DC

## 2022-12-11 MED ORDER — BENZTROPINE MESYLATE 1 MG PO TABS
1.0000 mg | ORAL_TABLET | Freq: Two times a day (BID) | ORAL | Status: DC
  Administered 2022-12-11 – 2022-12-14 (×7): 1 mg via ORAL
  Filled 2022-12-11 (×8): qty 1

## 2022-12-11 MED ORDER — ACETAMINOPHEN 650 MG RE SUPP: 650.0000 mg | Freq: Four times a day (QID) | RECTAL | Status: DC | PRN

## 2022-12-11 MED ORDER — SENNOSIDES-DOCUSATE SODIUM 8.6-50 MG PO TABS: 1.0000 | ORAL_TABLET | Freq: Every evening | ORAL | Status: DC | PRN

## 2022-12-11 MED ORDER — CLOZAPINE 25 MG PO TABS
50.0000 mg | ORAL_TABLET | Freq: Two times a day (BID) | ORAL | Status: DC
  Administered 2022-12-11 – 2022-12-14 (×8): 50 mg via ORAL
  Filled 2022-12-11 (×9): qty 2

## 2022-12-11 MED ORDER — ACETAMINOPHEN 325 MG PO TABS: 650.0000 mg | ORAL_TABLET | Freq: Four times a day (QID) | ORAL | Status: DC | PRN

## 2022-12-11 MED ORDER — LEVETIRACETAM 500 MG PO TABS
500.0000 mg | ORAL_TABLET | Freq: Two times a day (BID) | ORAL | Status: DC
  Administered 2022-12-11 – 2022-12-14 (×8): 500 mg via ORAL
  Filled 2022-12-11 (×9): qty 1

## 2022-12-11 MED ORDER — IOHEXOL 9 MG/ML PO SOLN
ORAL | Status: AC
  Filled 2022-12-11: qty 1000

## 2022-12-11 MED ORDER — TRAZODONE HCL 100 MG PO TABS
100.0000 mg | ORAL_TABLET | Freq: Every day | ORAL | Status: DC
  Administered 2022-12-11 – 2022-12-14 (×5): 100 mg via ORAL
  Filled 2022-12-11 (×5): qty 1

## 2022-12-11 MED ORDER — QUETIAPINE FUMARATE 100 MG PO TABS
200.0000 mg | ORAL_TABLET | Freq: Every day | ORAL | Status: DC
  Administered 2022-12-11 – 2022-12-14 (×5): 200 mg via ORAL
  Filled 2022-12-11 (×5): qty 2

## 2022-12-11 MED ORDER — HYDROXYZINE HCL 25 MG PO TABS
25.0000 mg | ORAL_TABLET | Freq: Two times a day (BID) | ORAL | Status: DC | PRN
  Administered 2022-12-11 – 2022-12-15 (×3): 25 mg via ORAL
  Filled 2022-12-11 (×4): qty 1

## 2022-12-11 MED ORDER — IOHEXOL 9 MG/ML PO SOLN
500.0000 mL | ORAL | Status: AC
  Administered 2022-12-11 (×2): 500 mL via ORAL

## 2022-12-11 MED ORDER — QUETIAPINE FUMARATE 25 MG PO TABS
50.0000 mg | ORAL_TABLET | Freq: Two times a day (BID) | ORAL | Status: DC
  Administered 2022-12-11 – 2022-12-14 (×8): 50 mg via ORAL
  Filled 2022-12-11 (×8): qty 2

## 2022-12-11 MED ORDER — MIRTAZAPINE 15 MG PO TABS
15.0000 mg | ORAL_TABLET | Freq: Every day | ORAL | Status: DC
  Administered 2022-12-11 – 2022-12-14 (×5): 15 mg via ORAL
  Filled 2022-12-11 (×5): qty 1

## 2022-12-11 NOTE — Progress Notes (Signed)
PROGRESS NOTE  LEGION DISCHER HQI:[id number] DOB: 01/11/1961 DOA: 12/21/2022 PCP: [facility]   LOS: 0 days   Brief Narrative / Interim history: 62 year old male with paranoid schizophrenia, DM2, HTN, seizure disorder who comes into the hospital from assisted living facility due to mental status change.  Normally he can hold conversation, walk, feed himself but has been having difficulties ambulating, incontinence, and speaking to himself and reporting hearing voices.  He was found to have acute kidney injury and was admitted to the hospital.  Workup in the ED included a CT of the head which was negative for acute processes, chest x-ray was negative for pneumonia, and urinalysis was negative for UTI.  UDS was negative  Subjective / 24h Interval events: Would answer questions with yes and no, does not engage in significant conversation.  Appears comfortable in bed  Assesement and Plan: Principal Problem:   Acute kidney injury (HCC) Active Problems:   AMS (altered mental status)   Diabetes mellitus without complication (HCC)   Hypertension   Paranoid schizophrenia (HCC)   Seizure disorder (HCC)   Principal problem Acute kidney injury -not 100% sure about inciting factor but definitely keep contribute to his mental status changes -Continue fluids -Scan abdomen pelvis  Active problems Altered mental status - Possibly due to azotemia versus paranoid schizophrenia.  Continue management as above.  UA negative for UTI.  CT head negative.  CT abdomen pelvis pending.  If creatinine improving and his mental status is not, will consult psychiatry   Paranoid schizophrenia - Continue clozapine, amantadine, benztropine, Seroquel, trazodone.  Will need psychiatry input if symptoms persist despite correction of renal dysfunction.   Type 2 diabetes - Holding metformin and Farxiga with AKI.  A1c well-controlled at 6.1.  Potentially could hold his medications with recurrent AKI    Hypertension - BP is stable.  Taken off lisinopril last month due to issues with orthostasis.  Continue to monitor.   Seizure disorder - No reported seizure activity.  Continue Keppra and Depakote.  Scheduled Meds:  amantadine  100 mg Oral BID   benztropine  1 mg Oral BID   cloZAPine  50 mg Oral BID   divalproex  500 mg Oral BID   heparin  5,000 Units Subcutaneous Q8H   levETIRAcetam  500 mg Oral BID   mirtazapine  15 mg Oral QHS   QUEtiapine  200 mg Oral QHS   QUEtiapine  50 mg Oral BID BM   traZODone  100 mg Oral QHS   Continuous Infusions: PRN Meds:.acetaminophen **OR** acetaminophen, hydrOXYzine, senna-docusate  Current Outpatient Medications  Medication Instructions   amantadine (SYMMETREL) 100 mg, Oral, 2 times daily   benztropine (COGENTIN) 1 mg, Oral, 2 times daily   cetirizine (ZYRTEC) 10 mg, Oral, Daily   cloZAPine (CLOZARIL) 25 MG tablet Take 1 tablet (25 mg total) by mouth 2 (two) times daily for 3 days, THEN 2 tablets (50 mg total) 2 (two) times daily. Needs prompt follow up with outpatient psychiatry and intermittent orthostatic checks.   dapagliflozin propanediol (FARXIGA) 10 mg, Oral, Daily after breakfast   divalproex (DEPAKOTE ER) 500 mg, Oral, 2 times daily   ergocalciferol (VITAMIN D2) 50,000 Units, Oral, Weekly, Thursday    hydrOXYzine (ATARAX) 25 mg, Oral, 2 times daily PRN   levETIRAcetam (KEPPRA) 500 mg, Oral, 2 times daily   magnesium oxide (MAG-OX) 400 mg, Oral, Daily   metFORMIN (GLUCOPHAGE) 1,000 mg, Oral, 2 times daily with meals   mirtazapine (REMERON) 15 mg, Oral, Daily  at bedtime   polyethylene glycol (MIRALAX / GLYCOLAX) 17 g, Oral, 2 times daily   QUEtiapine (SEROQUEL) 200 mg, Oral, Daily at bedtime   QUEtiapine (SEROQUEL) 50 mg, Oral, See admin instructions, Take 50 mg (1 tablet) by mouth every morning and at 3 pm.   traZODone (DESYREL) 100 mg, Oral, Daily at bedtime   zolpidem (AMBIEN) 5 mg, Oral, At bedtime PRN    Diet Orders (From  admission, onward)     Start     Ordered   12/11/22 0013  Diet regular Room service appropriate? Yes; Fluid consistency: Thin  Diet effective now       Question Answer Comment  Room service appropriate? Yes   Fluid consistency: Thin      12/11/22 0013            DVT prophylaxis: heparin injection 5,000 Units Start: 12/11/22 0600   Lab Results  Component Value Date   PLT 170 12/11/2022      Code Status: Full Code  Family Communication: no family at bedside   Status is: Observation The patient will require care spanning > 2 midnights and should be moved to inpatient because: AKI   Level of care: Med-Surg  Consultants:  none  Objective: Vitals:   12/11/22 0104 12/11/22 0136 12/11/22 0440 12/11/22 0836  BP: 135/87 (!) 152/77 116/80 (!) 133/92  Pulse: 93 (!) 52 85 89  Resp: 18 18 18 16   Temp: 98 F (36.7 C) 97.6 F (36.4 C) 97.7 F (36.5 C) 98.1 F (36.7 C)  TempSrc: Oral  Oral Oral  SpO2: 94% 92% 100%     Intake/Output Summary (Last 24 hours) at 12/11/2022 1142 Last data filed at 12/11/2022 0500 Gross per 24 hour  Intake 270.19 ml  Output --  Net 270.19 ml   Wt Readings from Last 3 Encounters:  11/19/22 55.9 kg  11/16/22 55.9 kg  11/01/22 55.8 kg    Examination:  Constitutional: NAD Eyes: no scleral icterus ENMT: Mucous membranes are moist.  Neck: normal, supple Respiratory: clear to auscultation bilaterally, no wheezing, no crackles. Normal respiratory effort. No accessory muscle use.  Cardiovascular: Regular rate and rhythm, no murmurs / rubs / gallops. No LE edema.  Abdomen: non distended, no tenderness. Bowel sounds positive.  Musculoskeletal: no clubbing / cyanosis.   Data Reviewed: I have independently reviewed following labs and imaging studies   CBC Recent Labs  Lab 11/22/2022 2226 12/11/22 0540  WBC 9.2 9.9  HGB 10.1* 9.5*  HCT 31.0* 29.6*  PLT 178 170  MCV 81.6 82.9  MCH 26.6 26.6  MCHC 32.6 32.1  RDW 14.4 14.6  LYMPHSABS  1.4  --   MONOABS 0.8  --   EOSABS 0.1  --   BASOSABS 0.0  --     Recent Labs  Lab 12/16/2022 2209 12/01/2022 2226 12/11/22 0540  NA  --  135 135  K  --  4.9 4.9  CL  --  102 104  CO2  --  23 21*  GLUCOSE  --  79 90  BUN  --  50* 50*  CREATININE  --  2.07* 1.92*  CALCIUM  --  9.6 9.2  AST  --  25  --   ALT  --  14  --   ALKPHOS  --  85  --   BILITOT  --  0.7  --   ALBUMIN  --  4.0  --   HGBA1C  --   --  6.1*  AMMONIA  19  --   --     ------------------------------------------------------------------------------------------------------------------ No results for input(s): "CHOL", "HDL", "LDLCALC", "TRIG", "CHOLHDL", "LDLDIRECT" in the last 72 hours.  Lab Results  Component Value Date   HGBA1C 6.1 (H) 12/11/2022   ------------------------------------------------------------------------------------------------------------------ No results for input(s): "TSH", "T4TOTAL", "T3FREE", "THYROIDAB" in the last 72 hours.  Invalid input(s): "FREET3"  Cardiac Enzymes No results for input(s): "CKMB", "TROPONINI", "MYOGLOBIN" in the last 168 hours.  Invalid input(s): "CK" ------------------------------------------------------------------------------------------------------------------    Component Value Date/Time   BNP 94.0 08/22/2022 1512    CBG: Recent Labs  Lab 11/24/2022 2236  GLUCAP 79    No results found for this or any previous visit (from the past 240 hour(s)).   Radiology Studies: CT Head Wo Contrast  Result Date: 11/30/2022 CLINICAL DATA:  Mental status change, unknown cause. EXAM: CT HEAD WITHOUT CONTRAST TECHNIQUE: Contiguous axial images were obtained from the base of the skull through the vertex without intravenous contrast. RADIATION DOSE REDUCTION: This exam was performed according to the departmental dose-optimization program which includes automated exposure control, adjustment of the mA and/or kV according to patient size and/or use of iterative  reconstruction technique. COMPARISON:  None Available. FINDINGS: Brain: No acute hemorrhage, midline shift or mass effect. No extra-axial fluid collection. Diffuse atrophy is noted. Mild periventricular white matter hypodensities are noted bilaterally. No hydrocephalus. Vascular: No hyperdense vessel or unexpected calcification. Skull: No acute fracture. Sinuses/Orbits: No acute finding. Other: None. IMPRESSION: 1. No acute intracranial process. 2. Atrophy with chronic microvascular ischemic changes. Electronically Signed   By: Thornell Sartorius M.D.   On: 12/14/2022 20:49   DG Chest 1 View  Result Date: 12/08/2022 CLINICAL DATA:  Altered mental status EXAM: CHEST  1 VIEW COMPARISON:  08/16/2022 FINDINGS: Shallow inspiration. Heart size and pulmonary vascularity are normal. Lungs are clear. No pleural effusions. No pneumothorax. Mediastinal contours appear intact. IMPRESSION: No active disease. Electronically Signed   By: Burman Nieves M.D.   On: 11/29/2022 20:48     Pamella Pert, MD, PhD [facility]  Between 7 am - 7 pm I am available, please contact me via Amion (for emergencies) or Securechat (non urgent messages)  Between 7 pm - 7 am I am not available, please contact night coverage MD/APP via Amion

## 2022-12-11 NOTE — Plan of Care (Signed)
  Problem: Clinical Measurements: Goal: Ability to maintain clinical measurements within normal limits will improve Outcome: Progressing   Problem: Elimination: Goal: Will not experience complications related to urinary retention Outcome: Progressing   Problem: Safety: Goal: Ability to remain free from injury will improve Outcome: Progressing   Problem: Skin Integrity: Goal: Risk for impaired skin integrity will decrease Outcome: Progressing

## 2022-12-11 NOTE — ED Notes (Signed)
ED TO INPATIENT HANDOFF REPORT  ED Nurse Name and Phone #: Claiborne Rigg Name/Age/Gender Ronald Roman 62 y.o. male Room/Bed: WA14/WA14  Code Status   Code Status: Full Code  Home/SNF/Other [facility] Patient oriented to: self Is this baseline? Yes   Triage Complete: Triage complete  Chief Complaint Acute kidney injury [facility]) [N17.9]  Triage Note BIBA - brought in from [facility]. Hx schizophrenia/dementia. Facility called for behavioral changes. Per EMS pt is hearing voices and very figidity today but more confused than normal. The social worker at the facility wanted him to come to the ER for evaluation. Pt will not hold still in triage.    Allergies Allergies  Allergen Reactions   Cogentin [Benztropine] Other (See Comments)    Not documented on the [facility]   Penicillins Other (See Comments)    Not documented on the [facility]    Prolixin [Fluphenazine] Other (See Comments)    Not [facility]     Level of Care/Admitting Diagnosis ED Disposition     ED Disposition  Admit   Condition  --   Comment  Hospital Area: [facility] [[id number]]  Level of Care: Med-Surg [16]  May place patient in observation at [facility] if equivalent level of care is available:: No  Covid Evaluation: Asymptomatic - no recent exposure (last 10 days) testing not required  Diagnosis: Acute kidney injury [facility]) [[id number]]  Admitting Physician: Charlsie Quest [[id number]]  Attending Physician: Charlsie Quest [[id number]]          B Medical/Surgery History Past Medical History:  Diagnosis Date   Diabetes mellitus without complication (HCC)    GERD (gastroesophageal reflux disease)    Hyperlipidemia    Hypertension    Paranoid schizophrenia (HCC)    Seizures (HCC)    Sleep apnea    Uric acid nephrolithiasis    Past Surgical History:  Procedure Laterality Date   ESOPHAGOGASTRODUODENOSCOPY (EGD) WITH PROPOFOL N/A 08/21/2022    Procedure: ESOPHAGOGASTRODUODENOSCOPY (EGD) WITH PROPOFOL;  Surgeon: Beverley Fiedler, MD;  Location: [facility] ENDOSCOPY;  Service: Gastroenterology;  Laterality: N/A;     A IV Location/Drains/Wounds Patient Lines/Drains/Airways Status     Active Line/Drains/Airways     Name Placement date Placement time Site Days   Peripheral IV 12/03/2022 20 G 2.5" Right;Upper Arm 11/27/2022  2216  Arm  1            Intake/Output Last 24 hours No intake or output data in the 24 hours ending 12/11/22 0019  Labs/Imaging Results for orders placed or performed during the hospital encounter of 11/25/2022 (from the past 48 hour(s))  Urinalysis, w/ Reflex to Culture (Infection Suspected) -Urine, Catheterized     Status: Abnormal   Collection Time: 12/21/2022  9:00 PM  Result Value Ref Range   Specimen Source URINE, CATHETERIZED    Color, Urine YELLOW YELLOW   APPearance CLEAR CLEAR   Specific Gravity, Urine 1.016 1.005 - 1.030   pH 5.0 5.0 - 8.0   Glucose, UA >=500 (A) NEGATIVE mg/dL   Hgb urine dipstick NEGATIVE NEGATIVE   Bilirubin Urine NEGATIVE NEGATIVE   Ketones, ur 5 (A) NEGATIVE mg/dL   Protein, ur 30 (A) NEGATIVE mg/dL   Nitrite NEGATIVE NEGATIVE   Leukocytes,Ua NEGATIVE NEGATIVE   RBC / HPF 0-5 0 - 5 RBC/hpf   WBC, UA 0-5 0 - 5 WBC/hpf    Comment:        Reflex urine  culture not performed if WBC <=10, OR if Squamous epithelial cells >5. If Squamous epithelial cells >5 suggest recollection.    Bacteria, UA NONE SEEN NONE SEEN   Squamous Epithelial / HPF 0-5 0 - 5 /HPF   Mucus PRESENT    Hyaline Casts, UA PRESENT     Comment: Performed at [facility], [address], [place], [name] [address]  Rapid urine drug screen (hospital performed)     Status: None   Collection Time: 12-16-2022  9:00 PM  Result Value Ref Range   Opiates NONE DETECTED NONE DETECTED   Cocaine NONE DETECTED NONE DETECTED   Benzodiazepines NONE DETECTED NONE DETECTED   Amphetamines NONE DETECTED NONE  DETECTED   Tetrahydrocannabinol NONE DETECTED NONE DETECTED   Barbiturates NONE DETECTED NONE DETECTED    Comment: (NOTE) DRUG SCREEN FOR MEDICAL PURPOSES ONLY.  IF CONFIRMATION IS NEEDED FOR ANY PURPOSE, NOTIFY LAB WITHIN 5 DAYS.  LOWEST DETECTABLE LIMITS FOR URINE DRUG SCREEN Drug Class                     Cutoff (ng/mL) Amphetamine and metabolites    1000 Barbiturate and metabolites    200 Benzodiazepine                 200 Opiates and metabolites        300 Cocaine and metabolites        300 THC                            50 Performed at [facility], [address] [place], [name] [address]   Ammonia     Status: None   Collection Time: 12-16-2022 10:09 PM  Result Value Ref Range   Ammonia 19 9 - 35 umol/L    Comment: Performed at [facility], [address] [place], [name] [address]  Acetaminophen level     Status: Abnormal   Collection Time: 2022-12-16 10:26 PM  Result Value Ref Range   Acetaminophen (Tylenol), Serum <10 (L) 10 - 30 ug/mL    Comment: (NOTE) Therapeutic concentrations vary significantly. A range of 10-30 ug/mL  may be an effective concentration for many patients. However, some  are best treated at concentrations outside of this range. Acetaminophen concentrations >150 ug/mL at 4 hours after ingestion  and >50 ug/mL at 12 hours after ingestion are often associated with  toxic reactions.  Performed at [facility], [address], [place], [name] [address]   Comprehensive metabolic panel     Status: Abnormal   Collection Time: 12-16-22 10:26 PM  Result Value Ref Range   Sodium 135 135 - 145 mmol/L   Potassium 4.9 3.5 - 5.1 mmol/L   Chloride 102 98 - 111 mmol/L   CO2 23 22 - 32 mmol/L   Glucose, Bld 79 70 - 99 mg/dL    Comment: Glucose reference range applies only to samples taken after fasting for at least 8 hours.   BUN 50 (H) 8 - 23 mg/dL   Creatinine, Ser 5.78 (H) 0.61 - 1.24 mg/dL    Calcium 9.6 8.9 - 46.9 mg/dL   Total Protein 8.0 6.5 - 8.1 g/dL   Albumin 4.0 3.5 - 5.0 g/dL   AST 25 15 - 41 U/L   ALT 14 0 - 44 U/L   Alkaline Phosphatase 85 38 - 126 U/L   Total Bilirubin 0.7 0.3 -  1.2 mg/dL   GFR, Estimated 36 (L) >60 mL/min    Comment: (NOTE) Calculated using the CKD-EPI Creatinine Equation (2021)    Anion gap 10 5 - 15    Comment: Performed at [facility], [address] [place], [name] [address]  Ethanol     Status: None   Collection Time: 12/22/2022 10:26 PM  Result Value Ref Range   Alcohol, Ethyl (B) <10 <10 mg/dL    Comment: (NOTE) Lowest detectable limit for serum alcohol is 10 mg/dL.  For medical purposes only. Performed at [facility], [address], [place], [name] [address]   Lipase, blood     Status: None   Collection Time: 12/07/2022 10:26 PM  Result Value Ref Range   Lipase 27 11 - 51 U/L    Comment: Performed at [facility], [address], [place], [name] [address]  Salicylate level     Status: Abnormal   Collection Time: 11/27/2022 10:26 PM  Result Value Ref Range   Salicylate Lvl <7.0 (L) 7.0 - 30.0 mg/dL    Comment: Performed at [facility], [address] [place], [name] [address]  CBC with Differential     Status: Abnormal   Collection Time: 12/14/2022 10:26 PM  Result Value Ref Range   WBC 9.2 4.0 - 10.5 K/uL   RBC 3.80 (L) 4.22 - 5.81 MIL/uL   Hemoglobin 10.1 (L) 13.0 - 17.0 g/dL   HCT 95.2 (L) 84.1 - 32.4 %   MCV 81.6 80.0 - 100.0 fL   MCH 26.6 26.0 - 34.0 pg   MCHC 32.6 30.0 - 36.0 g/dL   RDW 40.1 02.7 - 25.3 %   Platelets 178 150 - 400 K/uL   nRBC 0.0 0.0 - 0.2 %   Neutrophils Relative % 73 %   Neutro Abs 6.8 1.7 - 7.7 K/uL   Lymphocytes Relative 16 %   Lymphs Abs 1.4 0.7 - 4.0 K/uL   Monocytes Relative 9 %   Monocytes Absolute 0.8 0.1 - 1.0 K/uL   Eosinophils Relative 1 %   Eosinophils Absolute 0.1 0.0 - 0.5 K/uL   Basophils Relative 0  %   Basophils Absolute 0.0 0.0 - 0.1 K/uL   Immature Granulocytes 1 %   Abs Immature Granulocytes 0.05 0.00 - 0.07 K/uL    Comment: Performed at [facility], [address], [place], [name] [address]  POC CBG, ED     Status: None   Collection Time: 12/09/2022 10:36 PM  Result Value Ref Range   Glucose-Capillary 79 70 - 99 mg/dL    Comment: Glucose reference range applies only to samples taken after fasting for at least 8 hours.   CT Head Wo Contrast  Result Date: 12/19/2022 CLINICAL DATA:  Mental status change, unknown cause. EXAM: CT HEAD WITHOUT CONTRAST TECHNIQUE: Contiguous axial images were obtained from the base of the skull through the vertex without intravenous contrast. RADIATION DOSE REDUCTION: This exam was performed according to the departmental dose-optimization program which includes automated exposure control, adjustment of the mA and/or kV according to patient size and/or use of iterative reconstruction technique. COMPARISON:  None Available. FINDINGS: Brain: No acute hemorrhage, midline shift or mass effect. No extra-axial fluid collection. Diffuse atrophy is noted. Mild periventricular white matter hypodensities are noted bilaterally. No hydrocephalus. Vascular: No hyperdense vessel or unexpected calcification. Skull: No acute fracture. Sinuses/Orbits: No acute finding. Other: None. IMPRESSION: 1. No acute intracranial process. 2. Atrophy with  chronic microvascular ischemic changes. Electronically Signed   By: Thornell Sartorius M.D.   On: 12-29-22 20:49   DG Chest 1 View  Result Date: 2022-12-29 CLINICAL DATA:  Altered mental status EXAM: CHEST  1 VIEW COMPARISON:  08/16/2022 FINDINGS: Shallow inspiration. Heart size and pulmonary vascularity are normal. Lungs are clear. No pleural effusions. No pneumothorax. Mediastinal contours appear intact. IMPRESSION: No active disease. Electronically Signed   By: Burman Nieves M.D.   On: 12-29-22 20:48    Pending  Labs Unresulted Labs (From admission, onward)     Start     Ordered   12/11/22 0500  Basic metabolic panel  Tomorrow morning,   R        12/11/22 0013   12/11/22 0500  CBC  Tomorrow morning,   R        12/11/22 0013   12/11/22 0500  Hemoglobin A1c  Tomorrow morning,   R        12/11/22 0018            Vitals/Pain Today's Vitals   29-Dec-2022 2130 29-Dec-2022 2145 December 29, 2022 2215 29-Dec-2022 2328  BP: 128/81 129/84 (!) 142/87   Pulse: 81 80 90   Resp:   18   Temp:    98 F (36.7 C)  TempSrc:    Oral  SpO2: 93% 100% 97%   PainSc:        Isolation Precautions No active isolations  Medications Medications  heparin injection 5,000 Units (has no administration in time range)  acetaminophen (TYLENOL) tablet 650 mg (has no administration in time range)    Or  acetaminophen (TYLENOL) suppository 650 mg (has no administration in time range)  senna-docusate (Senokot-S) tablet 1 tablet (has no administration in time range)  0.9 %  sodium chloride infusion (has no administration in time range)  amantadine (SYMMETREL) capsule 100 mg (has no administration in time range)  benztropine (COGENTIN) tablet 1 mg (has no administration in time range)  cloZAPine (CLOZARIL) tablet 50 mg (has no administration in time range)  divalproex (DEPAKOTE ER) 24 hr tablet 500 mg (has no administration in time range)  hydrOXYzine (ATARAX) tablet 25 mg (has no administration in time range)  levETIRAcetam (KEPPRA) tablet 500 mg (has no administration in time range)  mirtazapine (REMERON) tablet 15 mg (has no administration in time range)  QUEtiapine (SEROQUEL) tablet 50 mg (has no administration in time range)  traZODone (DESYREL) tablet 100 mg (has no administration in time range)  QUEtiapine (SEROQUEL) tablet 200 mg (has no administration in time range)  lactated ringers bolus 1,000 mL (1,000 mLs Intravenous New Bag/Given 12/29/22 2216)    Mobility Unable to assess     Focused Assessments Pt was brought  in by EMS. Per EMS the social worker at [facility] reported that the patient has been more confused than normal. Pt has hx of schizophrenia and dementia. Pt denies SI/HI but admits to auditory hallucinations. Pt states the voices are telling him to "kill someone but he's not listening to them." Pt is restless and difficult to redirect.    R Recommendations: See Admitting Provider Note  Report given to:   Additional Notes: n/a

## 2022-12-11 NOTE — Progress Notes (Signed)
PHARMACIST - PHYSICIAN ORDER COMMUNICATION  Ronald Roman is a 62 y.o. year old male with a history of paranoid schizophrenia on Clozapine PTA. Continuing this medication order as an inpatient requires that monitoring parameters per REMS requirements must be met.   Clozapine REMS Dispense Authorization was obtained, and will dispense inpatient.  RDA code [id number].  Verified Clozapine dose: 50mg  BID  Last ANC value and date reported on the Clozapine REMS website: 6.8 K/uL on 12/16/2022 ANC monitoring frequency: q week Next ANC reporting is due on (date) 12/17/2022.  Maryellen Pile, PharmD 12/11/2022, 12:59 AM

## 2022-12-12 DIAGNOSIS — N179 Acute kidney failure, unspecified: Secondary | ICD-10-CM | POA: Diagnosis not present

## 2022-12-12 LAB — BASIC METABOLIC PANEL
Anion gap: 8 (ref 5–15)
BUN: 29 mg/dL — ABNORMAL HIGH (ref 8–23)
CO2: 22 mmol/L (ref 22–32)
Calcium: 9.2 mg/dL (ref 8.9–10.3)
Chloride: 107 mmol/L (ref 98–111)
Creatinine, Ser: 1.28 mg/dL — ABNORMAL HIGH (ref 0.61–1.24)
GFR, Estimated: >60 mL/min (ref >60)
Glucose, Bld: 77 mg/dL (ref 70–99)
Potassium: 4.4 mmol/L (ref 3.5–5.1)
Sodium: 137 mmol/L (ref 135–145)

## 2022-12-12 LAB — MAGNESIUM: Magnesium: 1.5 mg/dL — ABNORMAL LOW (ref 1.7–2.4)

## 2022-12-12 MED ORDER — SENNOSIDES-DOCUSATE SODIUM 8.6-50 MG PO TABS
2.0000 | ORAL_TABLET | Freq: Two times a day (BID) | ORAL | Status: DC
  Administered 2022-12-12 – 2022-12-15 (×7): 2 via ORAL
  Filled 2022-12-12 (×7): qty 2

## 2022-12-12 MED ORDER — MAGNESIUM SULFATE 2 GM/50ML IV SOLN
2.0000 g | Freq: Once | INTRAVENOUS | Status: AC
  Administered 2022-12-12: 2 g via INTRAVENOUS
  Filled 2022-12-12: qty 50

## 2022-12-12 MED ORDER — POLYETHYLENE GLYCOL 3350 17 G PO PACK
17.0000 g | PACK | Freq: Two times a day (BID) | ORAL | Status: DC
  Administered 2022-12-12 – 2022-12-15 (×7): 17 g via ORAL
  Filled 2022-12-12 (×7): qty 1

## 2022-12-12 NOTE — Progress Notes (Signed)
PROGRESS NOTE  Ronald Roman XBM:[id number] DOB: February 24, 1961 DOA: 2022-12-29 PCP: [facility]   LOS: 1 day   Brief Narrative / Interim history: 62 year old male with paranoid schizophrenia, DM2, HTN, seizure disorder who comes into the hospital from assisted living facility due to mental status change.  Normally he can hold conversation, walk, feed himself but has been having difficulties ambulating, incontinence, and speaking to himself and reporting hearing voices.  He was found to have acute kidney injury and was admitted to the hospital.  Workup in the ED included a CT of the head which was negative for acute processes, chest x-ray was negative for pneumonia, and urinalysis was negative for UTI.  UDS was negative  Subjective / 24h Interval events: More alert today, answers questions more.  He is trying to eat some breakfast  Assesement and Plan: Principal Problem:   Acute kidney injury (HCC) Active Problems:   AMS (altered mental status)   Diabetes mellitus without complication (HCC)   Hypertension   Paranoid schizophrenia (HCC)   AKI (acute kidney injury) (HCC)   Seizure disorder (HCC)   Principal problem Acute kidney injury -possibly related to dehydration, CT scan of the abdomen pelvis done yesterday showed potentially ileus and significant constipation, which can definitely lead to poor p.o. intake.  -With fluids, his creatinine improved significantly and his mental status appears better -Continue fluids for today  Active problems Altered mental status - Possibly due to azotemia versus paranoid schizophrenia.  Continue management as above.  UA negative for UTI.  CT head negative.  CT abdomen pelvis with significant constipation -He seems to be improving today  Constipation, ileus-aggressive bowel regimen today.   Paranoid schizophrenia - Continue clozapine, amantadine, benztropine, Seroquel, trazodone.  Will need psychiatry input if symptoms persist despite  correction of renal dysfunction.   Type 2 diabetes - Holding metformin and Farxiga with AKI.  A1c well-controlled at 6.1.  Potentially could hold his medications with recurrent AKI   Hypertension - BP is stable.  Taken off lisinopril last month due to issues with orthostasis.  Continue to monitor.   Seizure disorder - No reported seizure activity.  Continue Keppra and Depakote.  Scheduled Meds:  amantadine  100 mg Oral BID   benztropine  1 mg Oral BID   cloZAPine  50 mg Oral BID   divalproex  500 mg Oral BID   heparin  5,000 Units Subcutaneous Q8H   levETIRAcetam  500 mg Oral BID   mirtazapine  15 mg Oral QHS   polyethylene glycol  17 g Oral BID   QUEtiapine  200 mg Oral QHS   QUEtiapine  50 mg Oral BID BM   senna-docusate  2 tablet Oral BID   traZODone  100 mg Oral QHS   Continuous Infusions:  sodium chloride 125 mL/hr at 12/12/22 0500   PRN Meds:.acetaminophen **OR** acetaminophen, hydrOXYzine, senna-docusate  Current Outpatient Medications  Medication Instructions   amantadine (SYMMETREL) 100 mg, Oral, 2 times daily   benztropine (COGENTIN) 1 mg, Oral, 2 times daily   cetirizine (ZYRTEC) 10 mg, Oral, Daily   cloZAPine (CLOZARIL) 25 MG tablet Take 1 tablet (25 mg total) by mouth 2 (two) times daily for 3 days, THEN 2 tablets (50 mg total) 2 (two) times daily. Needs prompt follow up with outpatient psychiatry and intermittent orthostatic checks.   dapagliflozin propanediol (FARXIGA) 10 mg, Oral, Daily after breakfast   divalproex (DEPAKOTE ER) 500 mg, Oral, 2 times daily   ergocalciferol (VITAMIN D2) 50,000 Units,  Oral, Weekly, Thursday    hydrOXYzine (ATARAX) 25 mg, Oral, 2 times daily PRN   levETIRAcetam (KEPPRA) 500 mg, Oral, 2 times daily   magnesium oxide (MAG-OX) 400 mg, Oral, Daily   metFORMIN (GLUCOPHAGE) 1,000 mg, Oral, 2 times daily with meals   mirtazapine (REMERON) 15 mg, Oral, Daily at bedtime   polyethylene glycol (MIRALAX / GLYCOLAX) 17 g, Oral, 2 times daily    QUEtiapine (SEROQUEL) 200 mg, Oral, Daily at bedtime   QUEtiapine (SEROQUEL) 50 mg, Oral, See admin instructions, Take 50 mg (1 tablet) by mouth every morning and at 3 pm.   traZODone (DESYREL) 100 mg, Oral, Daily at bedtime   zolpidem (AMBIEN) 5 mg, Oral, At bedtime PRN    Diet Orders (From admission, onward)     Start     Ordered   12/11/22 0013  Diet regular Room service appropriate? Yes; Fluid consistency: Thin  Diet effective now       Question Answer Comment  Room service appropriate? Yes   Fluid consistency: Thin      12/11/22 0013            DVT prophylaxis: heparin injection 5,000 Units Start: 12/11/22 0600   Lab Results  Component Value Date   PLT 170 12/11/2022      Code Status: Full Code  Family Communication: no family at bedside, updated POA over the phone  Status is: Inpatient  Level of care: Med-Surg  Consultants:  none  Objective: Vitals:   12/11/22 1457 12/11/22 2009 12/12/22 0000 12/12/22 0507  BP: 128/85 124/86    Pulse: 89 86  92  Resp: 16 18  16   Temp: 97.6 F (36.4 C) 97.6 F (36.4 C)  97.7 F (36.5 C)  TempSrc: Axillary Oral  Axillary  SpO2:  91%  99%  Weight:   52.5 kg   Height:   5\' 6"  (1.676 m)     Intake/Output Summary (Last 24 hours) at 12/12/2022 1131 Last data filed at 12/12/2022 0830 Gross per 24 hour  Intake 1818.07 ml  Output 1650 ml  Net 168.07 ml   Wt Readings from Last 3 Encounters:  12/12/22 52.5 kg  11/19/22 55.9 kg  11/16/22 55.9 kg    Examination:  Constitutional: NAD Eyes: lids and conjunctivae normal, no scleral icterus ENMT: mmm Neck: normal, supple Respiratory: clear to auscultation bilaterally, no wheezing, no crackles. Normal respiratory effort.  Cardiovascular: Regular rate and rhythm, no murmurs / rubs / gallops. No LE edema. Abdomen: soft, no distention, no tenderness. Bowel sounds positive.   Data Reviewed: I have independently reviewed following labs and imaging studies   CBC Recent  Labs  Lab 12/06/2022 2226 12/11/22 0540  WBC 9.2 9.9  HGB 10.1* 9.5*  HCT 31.0* 29.6*  PLT 178 170  MCV 81.6 82.9  MCH 26.6 26.6  MCHC 32.6 32.1  RDW 14.4 14.6  LYMPHSABS 1.4  --   MONOABS 0.8  --   EOSABS 0.1  --   BASOSABS 0.0  --     Recent Labs  Lab 12/05/2022 2209 12/11/2022 2226 12/11/22 0540 12/12/22 0543  NA  --  135 135 137  K  --  4.9 4.9 4.4  CL  --  102 104 107  CO2  --  23 21* 22  GLUCOSE  --  79 90 77  BUN  --  50* 50* 29*  CREATININE  --  2.07* 1.92* 1.28*  CALCIUM  --  9.6 9.2 9.2  AST  --  25  --   --   ALT  --  14  --   --   ALKPHOS  --  85  --   --   BILITOT  --  0.7  --   --   ALBUMIN  --  4.0  --   --   MG  --   --   --  1.5*  HGBA1C  --   --  6.1*  --   AMMONIA 19  --   --   --     ------------------------------------------------------------------------------------------------------------------ No results for input(s): "CHOL", "HDL", "LDLCALC", "TRIG", "CHOLHDL", "LDLDIRECT" in the last 72 hours.  Lab Results  Component Value Date   HGBA1C 6.1 (H) 12/11/2022   ------------------------------------------------------------------------------------------------------------------ No results for input(s): "TSH", "T4TOTAL", "T3FREE", "THYROIDAB" in the last 72 hours.  Invalid input(s): "FREET3"  Cardiac Enzymes No results for input(s): "CKMB", "TROPONINI", "MYOGLOBIN" in the last 168 hours.  Invalid input(s): "CK" ------------------------------------------------------------------------------------------------------------------    Component Value Date/Time   BNP 94.0 08/22/2022 1512    CBG: Recent Labs  Lab 12/09/2022 2236  GLUCAP 79    No results found for this or any previous visit (from the past 240 hour(s)).   Radiology Studies: CT ABDOMEN PELVIS WO CONTRAST  Result Date: 12/11/2022 CLINICAL DATA:  Left lower quadrant pain EXAM: CT ABDOMEN AND PELVIS WITHOUT CONTRAST TECHNIQUE: Multidetector CT imaging of the abdomen and pelvis was  performed following the standard protocol without IV contrast. RADIATION DOSE REDUCTION: This exam was performed according to the departmental dose-optimization program which includes automated exposure control, adjustment of the mA and/or kV according to patient size and/or use of iterative reconstruction technique. COMPARISON:  CT abdomen and pelvis 10/31/2022 FINDINGS: Lower chest: No acute abnormality. Hepatobiliary: There is a calcified granuloma in the liver. No obvious liver lesion given lack of intravenous contrast and motion artifact. Gallbladder is grossly within normal limits. Pancreas: Grossly within normal limits given motion artifact. Spleen: Normal in size without focal abnormality. Adrenals/Urinary Tract: There is a 13 mm right renal cyst. Additional subcentimeter cortical hypodensity in the left kidney is too small to characterize, but favored as cysts. No urinary tract calculi or hydronephrosis. Adrenal glands are within normal limits. The bladder is distended, but otherwise within normal limits. Stomach/Bowel: The colon is diffusely dilated without definitive transition point. This is more prominent proximally. There is a very large amount of stool throughout the ascending colon, transverse colon and descending colon. The sigmoid colon is air-filled and dilated measuring up to 8.7 cm. No twisting identified. Additionally small-bowel loops are diffusely dilated measuring up to 4 cm without definite transition point. Air-fluid levels are seen throughout small bowel. The stomach is distended with large air-fluid level. The appendix is not seen. No focal inflammation or free air identified. Vascular/Lymphatic: No significant vascular findings are present. No enlarged abdominal or pelvic lymph nodes. Reproductive: Prostate gland is enlarged. Other: There is soft tissue density in the high right inguinal region which may be related to high riding testicle. Other etiologies are not excluded. No ascites.  Musculoskeletal: Degenerative changes affect the spine. IMPRESSION: 1. Diffuse dilatation of the colon and small bowel loops without definite transition point. Findings may be related to ileus. 2. Very large amount of stool throughout the colon. 3. Large air-fluid level in the stomach. 4. Soft tissue density in the high right inguinal region may be related to high riding testicle. Other etiologies are not excluded. Correlate with physical exam. Electronically Signed   By: Linton Rump  Malena Edman M.D.   On: 12/11/2022 20:20     Pamella Pert, MD, PhD [facility]  Between 7 am - 7 pm I am available, please contact me via Amion (for emergencies) or Securechat (non urgent messages)  Between 7 pm - 7 am I am not available, please contact night coverage MD/APP via Amion

## 2022-12-12 NOTE — Plan of Care (Signed)
?  Problem: Coping: ?Goal: Level of anxiety will decrease ?Outcome: Progressing ?  ?Problem: Safety: ?Goal: Ability to remain free from injury will improve ?Outcome: Progressing ?  ?

## 2022-12-12 NOTE — Evaluation (Signed)
Physical Therapy Evaluation Patient Details Name: Ronald Roman MRN: [id number] DOB: Feb 11, 1961 Today's Date: 12/12/2022  History of Present Illness  "62 year old male with paranoid schizophrenia, DM2, HTN, seizure disorder who comes into the hospital from assisted living facility due to mental status change.  Normally he can hold conversation, walk, feed himself but has been having difficulties ambulating, incontinence, and speaking to himself and reporting hearing voices.  He was found to have acute kidney injury and was admitted to the hospital."  Clinical Impression  Pt admitted with above diagnosis.  Pt currently with functional limitations due to the deficits listed below (see PT Problem List). Pt will benefit from acute skilled PT to increase their independence and safety with mobility to allow discharge.  Pt rolling and restless in bed however not agreeable to sit EOB or mobilize.  Pt admitted from ALF with mental status change per above.  Pt seen by PT 11/01/22 during hospitalization and was able to ambulate 75 feet at that time: ("Today, pt was Mod I with bed mobility and Min G/Min A with mobility with RW. Pt was somewhat unsteady with gait however no overt LOB noted.")  Anticipate pt's mobility to improve with improvement of mental/cognitive status.  If ALF is unable to provide assist then pt may need increased care.          Assistance Recommended at Discharge Frequent or constant Supervision/Assistance  If plan is discharge home, recommend the following:  Can travel by private vehicle  A little help with bathing/dressing/bathroom;A little help with walking and/or transfers        Equipment Recommendations Other (comment) (per facility)  Recommendations for Other Services       Functional Status Assessment Patient has had a recent decline in their functional status and demonstrates the ability to make significant improvements in function in a reasonable and predictable amount  of time.     Precautions / Restrictions Precautions Precautions: Fall      Mobility  Bed Mobility Overal bed mobility: Modified Independent             General bed mobility comments: pt rolling and restless in bed, would not sit EOB, wanted to rest, repositioned off of lines (pt had been laying on them)    Transfers                   General transfer comment: pt refused but appears able    Ambulation/Gait                  Stairs            Wheelchair Mobility     Tilt Bed    Modified Rankin (Stroke Patients Only)       Balance                                             Pertinent Vitals/Pain Pain Assessment Pain Assessment: No/denies pain    Home Living Family/patient expects to be discharged to:: Assisted living                        Prior Function Prior Level of Function : Patient poor historian/Family not available                     Hand Dominance  Extremity/Trunk Assessment             Cervical / Trunk Assessment Cervical / Trunk Assessment: Other exceptions Cervical / Trunk Exceptions: cachectic appearance  Communication   Communication: Expressive difficulties (increased time to respond, only uses short phrases)  Cognition Arousal/Alertness: Lethargic Behavior During Therapy: Flat affect, Restless Overall Cognitive Status: No family/caregiver present to determine baseline cognitive functioning                                 General Comments: admitted with AMS, pt not orientated to place or situation, only answering basic needs questions today        General Comments      Exercises     Assessment/Plan    PT Assessment Patient needs continued PT services  PT Problem List Decreased mobility;Decreased strength;Decreased activity tolerance;Decreased balance;Decreased knowledge of use of DME       PT Treatment Interventions DME instruction;Gait  training;Balance training;Therapeutic exercise;Functional mobility training;Therapeutic activities;Patient/family education    PT Goals (Current goals can be found in the Care Plan section)  Acute Rehab PT Goals PT Goal Formulation: Patient unable to participate in goal setting Time For Goal Achievement: 12/26/22 Potential to Achieve Goals: Good    Frequency Min 1X/week     Co-evaluation               AM-PAC PT "6 Clicks" Mobility  Outcome Measure Help needed turning from your back to your side while in a flat bed without using bedrails?: None Help needed moving from lying on your back to sitting on the side of a flat bed without using bedrails?: A Little Help needed moving to and from a bed to a chair (including a wheelchair)?: A Lot Help needed standing up from a chair using your arms (e.g., wheelchair or bedside chair)?: A Lot Help needed to walk in hospital room?: Total Help needed climbing 3-5 steps with a railing? : Total 6 Click Score: 13    End of Session   Activity Tolerance: Patient limited by fatigue Patient left: in bed;with call bell/phone within reach;with bed alarm set   PT Visit Diagnosis: Difficulty in walking, not elsewhere classified (R26.2)    Time: 8295-6213 PT Time Calculation (min) (ACUTE ONLY): 9 min   Charges:   PT Evaluation $PT Eval Low Complexity: 1 Low   PT General Charges $$ ACUTE PT VISIT: 1 Visit        Paulino Door, DPT Physical Therapist Acute Rehabilitation Services Office: [phone]   Ronald Roman [place] 12/12/2022, 1:03 PM

## 2022-12-13 DIAGNOSIS — N179 Acute kidney failure, unspecified: Secondary | ICD-10-CM | POA: Diagnosis not present

## 2022-12-13 LAB — BASIC METABOLIC PANEL
Anion gap: 9 (ref 5–15)
BUN: 19 mg/dL (ref 8–23)
CO2: 22 mmol/L (ref 22–32)
Calcium: 9.1 mg/dL (ref 8.9–10.3)
Chloride: 105 mmol/L (ref 98–111)
Creatinine, Ser: 1.07 mg/dL (ref 0.61–1.24)
GFR, Estimated: >60 mL/min (ref >60)
Glucose, Bld: 82 mg/dL (ref 70–99)
Potassium: 4.1 mmol/L (ref 3.5–5.1)
Sodium: 136 mmol/L (ref 135–145)

## 2022-12-13 LAB — MAGNESIUM: Magnesium: 1.8 mg/dL (ref 1.7–2.4)

## 2022-12-13 MED ORDER — SORBITOL 70 % SOLN
960.0000 mL | TOPICAL_OIL | Freq: Once | ORAL | Status: AC
  Administered 2022-12-13: 960 mL via RECTAL
  Filled 2022-12-13: qty 240

## 2022-12-13 NOTE — Plan of Care (Signed)

## 2022-12-13 NOTE — Progress Notes (Signed)
PROGRESS NOTE  Ronald Roman ZHY:[id number] DOB: 11/11/1960 DOA: 12/19/2022 PCP: [facility]   LOS: 2 days   Brief Narrative / Interim history: 62 year old male with paranoid schizophrenia, DM2, HTN, seizure disorder who comes into the hospital from assisted living facility due to mental status change.  Normally he can hold conversation, walk, feed himself but has been having difficulties ambulating, incontinence, and speaking to himself and reporting hearing voices.  He was found to have acute kidney injury and was admitted to the hospital.  Workup in the ED included a CT of the head which was negative for acute processes, chest x-ray was negative for pneumonia, and urinalysis was negative for UTI.  UDS was negative  Subjective / 24h Interval events: Continues to improve in terms of his alertness, answers questions appropriately.  Denies any hallucinations.  Assesement and Plan: Principal Problem:   Acute kidney injury (HCC) Active Problems:   AMS (altered mental status)   Diabetes mellitus without complication (HCC)   Hypertension   Paranoid schizophrenia (HCC)   AKI (acute kidney injury) (HCC)   Seizure disorder (HCC)   Principal problem Acute kidney injury -possibly related to dehydration, CT scan of the abdomen pelvis done showed potentially ileus and significant constipation, which can definitely lead to poor p.o. intake.  -With fluids, his creatinine improved significantly and his mental status appears better -Still with poor p.o. intake, keeping fluids  Active problems Altered mental status - Possibly due to azotemia versus paranoid schizophrenia.  Continue management as above.  UA negative for UTI.  CT head negative.  CT abdomen pelvis with significant constipation -Seems to be improving  Constipation, ileus-aggressive bowel regimen today.   Paranoid schizophrenia - Continue clozapine, amantadine, benztropine, Seroquel, trazodone.  Will need psychiatry input  if symptoms persist despite correction of renal dysfunction.   Type 2 diabetes - Holding metformin and Farxiga with AKI.  A1c well-controlled at 6.1.  Potentially could hold his medications with recurrent AKI   Hypertension - BP is stable.  Taken off lisinopril last month due to issues with orthostasis.  Continue to monitor.   Seizure disorder - No reported seizure activity.  Continue Keppra and Depakote.  Scheduled Meds:  amantadine  100 mg Oral BID   benztropine  1 mg Oral BID   cloZAPine  50 mg Oral BID   divalproex  500 mg Oral BID   heparin  5,000 Units Subcutaneous Q8H   levETIRAcetam  500 mg Oral BID   mirtazapine  15 mg Oral QHS   polyethylene glycol  17 g Oral BID   QUEtiapine  200 mg Oral QHS   QUEtiapine  50 mg Oral BID BM   senna-docusate  2 tablet Oral BID   sorbitol, milk of mag, mineral oil, glycerin (SMOG) enema  960 mL Rectal Once   traZODone  100 mg Oral QHS   Continuous Infusions:  sodium chloride 125 mL/hr at 12/12/22 2341   PRN Meds:.acetaminophen **OR** acetaminophen, hydrOXYzine, senna-docusate  Current Outpatient Medications  Medication Instructions   amantadine (SYMMETREL) 100 mg, Oral, 2 times daily   benztropine (COGENTIN) 1 mg, Oral, 2 times daily   cetirizine (ZYRTEC) 10 mg, Oral, Daily   cloZAPine (CLOZARIL) 25 MG tablet Take 1 tablet (25 mg total) by mouth 2 (two) times daily for 3 days, THEN 2 tablets (50 mg total) 2 (two) times daily. Needs prompt follow up with outpatient psychiatry and intermittent orthostatic checks.   dapagliflozin propanediol (FARXIGA) 10 mg, Oral, Daily after breakfast  divalproex (DEPAKOTE ER) 500 mg, Oral, 2 times daily   ergocalciferol (VITAMIN D2) 50,000 Units, Oral, Weekly, Thursday    hydrOXYzine (ATARAX) 25 mg, Oral, 2 times daily PRN   levETIRAcetam (KEPPRA) 500 mg, Oral, 2 times daily   magnesium oxide (MAG-OX) 400 mg, Oral, Daily   metFORMIN (GLUCOPHAGE) 1,000 mg, Oral, 2 times daily with meals   mirtazapine  (REMERON) 15 mg, Oral, Daily at bedtime   polyethylene glycol (MIRALAX / GLYCOLAX) 17 g, Oral, 2 times daily   QUEtiapine (SEROQUEL) 200 mg, Oral, Daily at bedtime   QUEtiapine (SEROQUEL) 50 mg, Oral, See admin instructions, Take 50 mg (1 tablet) by mouth every morning and at 3 pm.   traZODone (DESYREL) 100 mg, Oral, Daily at bedtime   zolpidem (AMBIEN) 5 mg, Oral, At bedtime PRN    Diet Orders (From admission, onward)     Start     Ordered   12/11/22 0013  Diet regular Room service appropriate? Yes; Fluid consistency: Thin  Diet effective now       Question Answer Comment  Room service appropriate? Yes   Fluid consistency: Thin      12/11/22 0013            DVT prophylaxis: heparin injection 5,000 Units Start: 12/11/22 0600   Lab Results  Component Value Date   PLT 170 12/11/2022      Code Status: Full Code  Family Communication: no family at bedside, updated POA over the phone  Status is: Inpatient  Level of care: Med-Surg  Consultants:  none  Objective: Vitals:   12/12/22 0507 12/12/22 1227 12/12/22 1952 12/13/22 0427  BP:  117/79 (!) 135/95 (!) 142/79  Pulse: 92 88 (!) 106 83  Resp: 16 17 18 18   Temp: 97.7 F (36.5 C) 98.4 F (36.9 C) 98.2 F (36.8 C) 98.4 F (36.9 C)  TempSrc: Axillary  Oral Oral  SpO2: 99% 100% 90% 97%  Weight:      Height:        Intake/Output Summary (Last 24 hours) at 12/13/2022 1111 Last data filed at 12/13/2022 0900 Gross per 24 hour  Intake 3683.47 ml  Output 1200 ml  Net 2483.47 ml   Wt Readings from Last 3 Encounters:  12/12/22 52.5 kg  11/19/22 55.9 kg  11/16/22 55.9 kg    Examination:  Constitutional: NAD Eyes: lids and conjunctivae normal, no scleral icterus ENMT: mmm Neck: normal, supple Respiratory: clear to auscultation bilaterally, no wheezing, no crackles. Normal respiratory effort.  Cardiovascular: Regular rate and rhythm, no murmurs / rubs / gallops. No LE edema. Abdomen: soft, no distention, no  tenderness. Bowel sounds positive.   Data Reviewed: I have independently reviewed following labs and imaging studies   CBC Recent Labs  Lab 12/06/2022 2226 12/11/22 0540  WBC 9.2 9.9  HGB 10.1* 9.5*  HCT 31.0* 29.6*  PLT 178 170  MCV 81.6 82.9  MCH 26.6 26.6  MCHC 32.6 32.1  RDW 14.4 14.6  LYMPHSABS 1.4  --   MONOABS 0.8  --   EOSABS 0.1  --   BASOSABS 0.0  --     Recent Labs  Lab 11/26/2022 2209 11/22/2022 2226 12/11/22 0540 12/12/22 0543 12/13/22 0451  NA  --  135 135 137 136  K  --  4.9 4.9 4.4 4.1  CL  --  102 104 107 105  CO2  --  23 21* 22 22  GLUCOSE  --  79 90 77 82  BUN  --  50* 50* 29* 19  CREATININE  --  2.07* 1.92* 1.28* 1.07  CALCIUM  --  9.6 9.2 9.2 9.1  AST  --  25  --   --   --   ALT  --  14  --   --   --   ALKPHOS  --  85  --   --   --   BILITOT  --  0.7  --   --   --   ALBUMIN  --  4.0  --   --   --   MG  --   --   --  1.5* 1.8  HGBA1C  --   --  6.1*  --   --   AMMONIA 19  --   --   --   --     ------------------------------------------------------------------------------------------------------------------ No results for input(s): "CHOL", "HDL", "LDLCALC", "TRIG", "CHOLHDL", "LDLDIRECT" in the last 72 hours.  Lab Results  Component Value Date   HGBA1C 6.1 (H) 12/11/2022   ------------------------------------------------------------------------------------------------------------------ No results for input(s): "TSH", "T4TOTAL", "T3FREE", "THYROIDAB" in the last 72 hours.  Invalid input(s): "FREET3"  Cardiac Enzymes No results for input(s): "CKMB", "TROPONINI", "MYOGLOBIN" in the last 168 hours.  Invalid input(s): "CK" ------------------------------------------------------------------------------------------------------------------    Component Value Date/Time   BNP 94.0 08/22/2022 1512    CBG: Recent Labs  Lab 01/02/2023 2236  GLUCAP 79    No results found for this or any previous visit (from the past 240 hour(s)).   Radiology  Studies: No results found.   Pamella Pert, MD, PhD [facility]  Between 7 am - 7 pm I am available, please contact me via Amion (for emergencies) or Securechat (non urgent messages)  Between 7 pm - 7 am I am not available, please contact night coverage MD/APP via Amion

## 2022-12-13 NOTE — TOC Initial Note (Signed)
Transition of Care [facility]) - Initial/Assessment Note    Patient Details  Name: Ronald Roman MRN: [id number] Date of Birth: Apr 07, 1961  Transition of Care [facility]) CM/SW Contact:    Otelia Santee, LCSW Phone Number: 12/13/2022, 3:34 PM  Clinical Narrative:                 Pt from [facility]. CSW spoke with pt's guardian Annell Greening to discuss recommendation for SNF and barriers to SNF. Pt's legal guardian shares that pt would not have the income to pay for SNF. Pt is able to return to [facility] but, Ms Archie Patten is requesting pt work with PT again to see if he is able to participate prior to discharge.   Expected Discharge Plan: Assisted Living Barriers to Discharge: No Barriers Identified   Patient Goals and CMS Choice Patient states their goals for this hospitalization and ongoing recovery are:: For pt to return to [facility]gov Compare Post Acute Care list provided to:: Legal Guardian Choice offered to / list presented to : [facility] POA / Guardian Shirley ownership interest in [facility].provided to:: [facility] POA / Guardian    Expected Discharge Plan and Services In-house Referral: Clinical Social Work Discharge Planning Services: NA Post Acute Care Choice: Resumption of Svcs/PTA Provider, Nursing Home Living arrangements for the past 2 months: Assisted Living Facility                 DME Arranged: N/A DME Agency: NA                  Prior Living Arrangements/Services Living arrangements for the past 2 months: Assisted Living Facility Lives with:: Facility Resident Patient language and need for interpreter reviewed:: Yes Do you feel safe going back to the place where you live?: Yes      Need for Family Participation in Patient Care: No (Comment) Care giver support system in place?: Yes (comment) Current home services: DME Criminal Activity/Legal Involvement Pertinent to Current Situation/Hospitalization: No - Comment as  needed  Activities of Daily Living Home Assistive Devices/Equipment: Cane (specify quad or straight) ADL Screening (condition at time of admission) Patient's cognitive ability adequate to safely complete daily activities?: No Is the patient deaf or have difficulty hearing?: No Does the patient have difficulty seeing, even when wearing glasses/contacts?: No Does the patient have difficulty concentrating, remembering, or making decisions?: Yes Patient able to express need for assistance with ADLs?: No Does the patient have difficulty dressing or bathing?: Yes Independently performs ADLs?: No Communication: Independent Dressing (OT): Needs assistance Is this a change from baseline?: Pre-admission baseline Grooming: Needs assistance Is this a change from baseline?: Pre-admission baseline Feeding: Needs assistance Is this a change from baseline?: Change from baseline, expected to last <3 days Bathing: Needs assistance Is this a change from baseline?: Pre-admission baseline Toileting: Needs assistance Is this a change from baseline?: Pre-admission baseline In/Out Bed: Needs assistance Is this a change from baseline?: Change from baseline, expected to last <3 days Walks in Home: Independent with device (comment) (uses cane) Does the patient have difficulty walking or climbing stairs?: Yes Weakness of Legs: Both Weakness of Arms/Hands: Both  Permission Sought/Granted Permission sought to share information with : Facility Medical sales representative, Guardian                Emotional Assessment   Attitude/Demeanor/Rapport: Unable to Assess Affect (typically observed): Unable to Assess Orientation: : Oriented to Self Alcohol / Substance Use: Not  Applicable Psych Involvement: Outpatient Provider  Admission diagnosis:  AKI (acute kidney injury) (HCC) [N17.9] Acute kidney injury (HCC) [N17.9] Patient Active Problem List   Diagnosis Date Noted   Acute kidney injury (HCC) 11/26/2022    Syncope 10/31/2022   Gastroesophageal reflux disease with esophagitis without hemorrhage 08/21/2022   Abnormal CT scan, gastrointestinal tract 08/16/2022   Constipation 08/16/2022   Coffee ground emesis 08/15/2022   Insomnia due to other mental disorder 11/18/2020   Episode of recurrent major depressive disorder (HCC) 11/18/2020   Acute metabolic encephalopathy 10/13/2020   Impaired cognitive ability    Aggressive behavior    Seizure (HCC) 05/07/2020   AMS (altered mental status) 02/09/2020   Schizophrenia (HCC)    Leukocytosis 02/06/2015   Sleep apnea 02/05/2015   AKI (acute kidney injury) (HCC) 02/04/2015   Seizure disorder (HCC) 02/04/2015   Diabetes mellitus without complication (HCC)    Hyperlipidemia    Hypertension    Paranoid schizophrenia [facility])    PCP:  [facility]:   [facility] [place], [name] - [address] [name] [address] Phone: [phone] Fax: [phone]     Social Determinants of Health (SDOH) Social History: SDOH Screenings   Food Insecurity: Patient Declined (11/02/2022)  Housing: Patient Unable To Answer (11/02/2022)  Transportation Needs: No Transportation Needs (11/02/2022)  Utilities: Patient Unable To Answer (12/12/2022)  Depression (PHQ2-9): Medium Risk (04/29/2021)  Tobacco Use: Medium Risk (12/14/2022)   SDOH Interventions:     Readmission Risk Interventions    12/13/2022    3:30 PM  Readmission Risk Prevention Plan  Transportation Screening Complete  PCP or Specialist Appt within 5-7 Days Complete  Home Care Screening Complete  Medication Review (RN CM) Complete

## 2022-12-14 DIAGNOSIS — R4182 Altered mental status, unspecified: Secondary | ICD-10-CM | POA: Diagnosis not present

## 2022-12-14 DIAGNOSIS — E440 Moderate protein-calorie malnutrition: Secondary | ICD-10-CM | POA: Insufficient documentation

## 2022-12-14 DIAGNOSIS — N179 Acute kidney failure, unspecified: Secondary | ICD-10-CM | POA: Diagnosis not present

## 2022-12-14 LAB — CBC
HCT: 28.7 % — ABNORMAL LOW (ref 39.0–52.0)
Hemoglobin: 9.1 g/dL — ABNORMAL LOW (ref 13.0–17.0)
MCH: 27 pg (ref 26.0–34.0)
MCHC: 31.7 g/dL (ref 30.0–36.0)
MCV: 85.2 fL (ref 80.0–100.0)
Platelets: 163 10*3/uL (ref 150–400)
RBC: 3.37 MIL/uL — ABNORMAL LOW (ref 4.22–5.81)
RDW: 15 % (ref 11.5–15.5)
WBC: 9.5 10*3/uL (ref 4.0–10.5)
nRBC: 0 % (ref 0.0–0.2)

## 2022-12-14 LAB — BASIC METABOLIC PANEL
Anion gap: 8 (ref 5–15)
BUN: 19 mg/dL (ref 8–23)
CO2: 19 mmol/L — ABNORMAL LOW (ref 22–32)
Calcium: 9.1 mg/dL (ref 8.9–10.3)
Chloride: 111 mmol/L (ref 98–111)
Creatinine, Ser: 0.9 mg/dL (ref 0.61–1.24)
GFR, Estimated: >60 mL/min (ref >60)
Glucose, Bld: 98 mg/dL (ref 70–99)
Potassium: 3.7 mmol/L (ref 3.5–5.1)
Sodium: 138 mmol/L (ref 135–145)

## 2022-12-14 LAB — CK: Total CK: 246 U/L (ref 49–397)

## 2022-12-14 LAB — VALPROIC ACID LEVEL: Valproic Acid Lvl: 60 ug/mL (ref 50.0–100.0)

## 2022-12-14 LAB — MAGNESIUM: Magnesium: 1.5 mg/dL — ABNORMAL LOW (ref 1.7–2.4)

## 2022-12-14 LAB — AMMONIA: Ammonia: 38 umol/L — ABNORMAL HIGH (ref 9–35)

## 2022-12-14 MED ORDER — ONDANSETRON 4 MG PO TBDP
4.0000 mg | ORAL_TABLET | Freq: Once | ORAL | Status: AC
  Administered 2022-12-14: 4 mg via ORAL
  Filled 2022-12-14: qty 1

## 2022-12-14 MED ORDER — CLOZAPINE 25 MG PO TABS
25.0000 mg | ORAL_TABLET | Freq: Two times a day (BID) | ORAL | Status: DC
  Administered 2022-12-14 – 2022-12-15 (×2): 25 mg via ORAL
  Filled 2022-12-14 (×3): qty 1

## 2022-12-14 MED ORDER — MAGNESIUM SULFATE 4 GM/100ML IV SOLN
4.0000 g | Freq: Once | INTRAVENOUS | Status: AC
  Administered 2022-12-14: 4 g via INTRAVENOUS
  Filled 2022-12-14: qty 100

## 2022-12-14 MED ORDER — ADULT MULTIVITAMIN W/MINERALS CH
1.0000 | ORAL_TABLET | Freq: Every day | ORAL | Status: DC
  Administered 2022-12-14 – 2022-12-15 (×2): 1 via ORAL
  Filled 2022-12-14 (×2): qty 1

## 2022-12-14 MED ORDER — ENSURE ENLIVE PO LIQD
237.0000 mL | Freq: Two times a day (BID) | ORAL | Status: DC
  Administered 2022-12-14 – 2022-12-15 (×2): 237 mL via ORAL

## 2022-12-14 NOTE — Consult Note (Signed)
[facility] Face-to-Face Psychiatry Consult   Reason for Consult: Paranoid schizophrenia here with increased confusion due to a chaotic.  Prolonged confusion increasing dystonia. Referring Physician:   Patient Identification: Ronald Roman MRN:  [id number] Principal Diagnosis: Acute kidney injury [facility]) Diagnosis:  Principal Problem:   Acute kidney injury (HCC) Active Problems:   Diabetes mellitus without complication (HCC)   Hypertension   Paranoid schizophrenia (HCC)   AKI (acute kidney injury) (HCC)   Seizure disorder (HCC)   AMS (altered mental status)   Malnutrition of moderate degree   Total Time spent with patient: 30 minutes  Subjective:   Ronald Roman is a 62 y.o. male patient admitted with acute kidney injury.  Per chart review patient presents from [facility] for change in acute mental status.  Chart review further shows patient has increased and syncopal episodes, falls, confusion, and disorientation.  Patient usually has a Comptroller or caregiver, however none at the bedside.  He is also being manage by [facility] for seizures and ataxia.  Review of records show patient has history of paranoid schizophrenia, seizures.  On examination patient is a poor historian with limited to no participation in evaluation. He is able to answer hmm and uhh when asking simple questions or calling him by his name. He did follow commands such as raise your head.Despite his best attempt to open his eyes it is unclear if he was unable to do so or if his eyes were matted together due to bilateral drainage. He did not appear to have any dystonia on examination, but there was some mild stiffness noted no rigidity. He also did not appear to be acutely psychotic on exam. Staff reports he has not opened his eyes even while working with PT today(2 person assist) walked up the hall and down the hall.    HPI:  Ronald Roman is a 62 y.o. male with medical history significant for paranoid  schizophrenia, T2DM, HTN, seizure disorder who presented to the ED from [facility] assisted living for evaluation of change in mental status.   Patient brought to the ED from assisted living facility for acute change in mentation.  Patient was reportedly at his baseline day prior to admission.  Normally can hold a conversation, walk, feed himself.  However day of admission developed acute change with difficulty ambulating, incontinence, and speaking to himself.  Patient reported hearing voices.  Has not had any other complaints.  He is inattentive and tossing and turning in the bed.  Past Psychiatric History: Unable to obtain due to limited participation for patient.  Attempted to call nursing home x 2; unsuccessful.   Risk to Self:   Denies Risk to Others:   Denies Prior Inpatient Therapy:   UTA Prior Outpatient Therapy:   UTA  Past Medical History:  Past Medical History:  Diagnosis Date   Diabetes mellitus without complication (HCC)    GERD (gastroesophageal reflux disease)    Hyperlipidemia    Hypertension    Paranoid schizophrenia (HCC)    Seizures (HCC)    Sleep apnea    Uric acid nephrolithiasis     Past Surgical History:  Procedure Laterality Date   ESOPHAGOGASTRODUODENOSCOPY (EGD) WITH PROPOFOL N/A 08/21/2022   Procedure: ESOPHAGOGASTRODUODENOSCOPY (EGD) WITH PROPOFOL;  Surgeon: Beverley Fiedler, MD;  Location: [facility] ENDOSCOPY;  Service: Gastroenterology;  Laterality: N/A;   Family History: History reviewed. No pertinent family history. Family Psychiatric  History: UTA Social History:  Social History   Substance and Sexual Activity  Alcohol Use No     Social History   Substance and Sexual Activity  Drug Use No    Social History   Socioeconomic History   Marital status: Single    Spouse name: Not on file   Number of children: Not on file   Years of education: Not on file   Highest education level: Not on file  Occupational History   Not on file  Tobacco Use    Smoking status: Former    Current packs/day: 1.00    Types: Cigarettes   Smokeless tobacco: Never  Vaping Use   Vaping status: Never Used  Substance and Sexual Activity   Alcohol use: No   Drug use: No   Sexual activity: Not Currently  Other Topics Concern   Not on file  Social History Narrative   Lives at [facility]   Right handed    Social Determinants of Health   Financial Resource Strain: Not on file  Food Insecurity: Patient Declined (11/02/2022)   Hunger Vital Sign    Worried About Running Out of Food in the Last Year: Patient declined    Ran Out of Food in the Last Year: Patient declined  Transportation Needs: No Transportation Needs (11/02/2022)   PRAPARE - Administrator, Civil Service (Medical): No    Lack of Transportation (Non-Medical): No  Physical Activity: Not on file  Stress: Not on file  Social Connections: Not on file   Additional Social History:    Allergies:   Allergies  Allergen Reactions   Cogentin [Benztropine] Other (See Comments)    Not documented on the [facility]   Penicillins Other (See Comments)    Not documented on the [facility]    Prolixin [Fluphenazine] Other (See Comments)    Not documented on the [facility]     Labs:  Results for orders placed or performed during the hospital encounter of 03-Jan-2023 (from the past 48 hour(s))  Basic metabolic panel     Status: None   Collection Time: 12/13/22  4:51 AM  Result Value Ref Range   Sodium 136 135 - 145 mmol/L   Potassium 4.1 3.5 - 5.1 mmol/L   Chloride 105 98 - 111 mmol/L   CO2 22 22 - 32 mmol/L   Glucose, Bld 82 70 - 99 mg/dL    Comment: Glucose reference range applies only to samples taken after fasting for at least 8 hours.   BUN 19 8 - 23 mg/dL   Creatinine, Ser 1.61 0.61 - 1.24 mg/dL   Calcium 9.1 8.9 - 09.6 mg/dL   GFR, Estimated >04 >54 mL/min    Comment: (NOTE) Calculated using the CKD-EPI Creatinine Equation (2021)    Anion gap 9 5 - 15    Comment: Performed  at [facility], [address], [place], [name] [address]  Magnesium     Status: None   Collection Time: 12/13/22  4:51 AM  Result Value Ref Range   Magnesium 1.8 1.7 - 2.4 mg/dL    Comment: Performed at [facility], [address] [place], [name] [address]  CK     Status: None   Collection Time: 12/14/22  5:52 AM  Result Value Ref Range   Total CK 246 49 - 397 U/L    Comment: Performed at [facility], [address], [place], [name] [address]  Basic metabolic panel     Status: Abnormal   Collection Time: 12/14/22  5:53 AM  Result Value Ref Range   Sodium 138 135 - 145 mmol/L   Potassium 3.7 3.5 - 5.1 mmol/L   Chloride 111 98 - 111 mmol/L   CO2 19 (L) 22 - 32 mmol/L   Glucose, Bld 98 70 - 99 mg/dL    Comment: Glucose reference range applies only to samples taken after fasting for at least 8 hours.   BUN 19 8 - 23 mg/dL   Creatinine, Ser 1.61 0.61 - 1.24 mg/dL   Calcium 9.1 8.9 - 09.6 mg/dL   GFR, Estimated >04 >54 mL/min    Comment: (NOTE) Calculated using the CKD-EPI Creatinine Equation (2021)    Anion gap 8 5 - 15    Comment: Performed at [facility], [address] [place], [name] [address]  CBC     Status: Abnormal   Collection Time: 12/14/22  5:53 AM  Result Value Ref Range   WBC 9.5 4.0 - 10.5 K/uL   RBC 3.37 (L) 4.22 - 5.81 MIL/uL   Hemoglobin 9.1 (L) 13.0 - 17.0 g/dL   HCT 91.4 (L) 78.2 - 95.6 %   MCV 85.2 80.0 - 100.0 fL   MCH 27.0 26.0 - 34.0 pg   MCHC 31.7 30.0 - 36.0 g/dL   RDW 21.3 08.6 - 57.8 %   Platelets 163 150 - 400 K/uL   nRBC 0.0 0.0 - 0.2 %    Comment: Performed at [facility], [address], [place], [name] [address]  Magnesium     Status: Abnormal   Collection Time: 12/14/22  5:53 AM  Result Value Ref Range   Magnesium 1.5 (L) 1.7 - 2.4 mg/dL    Comment: Performed at [facility], [address], [place], [name] [address]     Current Facility-Administered Medications  Medication Dose Route Frequency Provider Last Rate Last Admin   0.9 %  sodium chloride infusion   Intravenous Continuous Leatha Gilding, MD 125 mL/hr at 12/12/22 2341 Infusion Verify at 12/12/22 2341   acetaminophen (TYLENOL) tablet 650 mg  650 mg Oral Q6H PRN Charlsie Quest, MD       Or   acetaminophen (TYLENOL) suppository 650 mg  650 mg Rectal Q6H PRN Charlsie Quest, MD       amantadine (SYMMETREL) capsule 100 mg  100 mg Oral BID Darreld Mclean R, MD   100 mg at 12/14/22 1324   cloZAPine (CLOZARIL) tablet 25 mg  25 mg Oral BID Maryagnes Amos, FNP       divalproex (DEPAKOTE ER) 24 hr tablet 500 mg  500 mg Oral BID Darreld Mclean R, MD   500 mg at 12/14/22 0917   feeding supplement (ENSURE ENLIVE / ENSURE PLUS) liquid 237 mL  237 mL Oral BID BM Leatha Gilding, MD   237 mL at 12/14/22 1417   heparin injection 5,000 Units  5,000 Units Subcutaneous Q8H Darreld Mclean R, MD   5,000 Units at 12/14/22 1417   hydrOXYzine (ATARAX) tablet 25 mg  25 mg Oral BID PRN Charlsie Quest, MD   25 mg at 12/11/22 2046   levETIRAcetam (KEPPRA) tablet 500 mg  500 mg Oral BID Darreld Mclean R, MD   500 mg at 12/14/22 4010   magnesium sulfate IVPB 4 g 100 mL  4 g Intravenous Once Leatha Gilding, MD 50 mL/hr at 12/14/22 1416 4 g at 12/14/22 1416   mirtazapine (REMERON) tablet 15 mg  15 mg Oral QHS Darreld Mclean  R, MD   15 mg at 12/13/22 2128   multivitamin with minerals tablet 1 tablet  1 tablet Oral Daily Leatha Gilding, MD   1 tablet at 12/14/22 1417   polyethylene glycol (MIRALAX / GLYCOLAX) packet 17 g  17 g Oral BID Leatha Gilding, MD   17 g at 12/14/22 1610   QUEtiapine (SEROQUEL) tablet 200 mg  200 mg Oral QHS Darreld Mclean R, MD   200 mg at 12/13/22 2128   QUEtiapine (SEROQUEL) tablet 50 mg  50 mg Oral BID BM Charlsie Quest, MD   50 mg at 12/14/22 1417   senna-docusate (Senokot-S) tablet 1 tablet  1 tablet Oral QHS PRN Charlsie Quest, MD        senna-docusate (Senokot-S) tablet 2 tablet  2 tablet Oral BID Leatha Gilding, MD   2 tablet at 12/14/22 9604   traZODone (DESYREL) tablet 100 mg  100 mg Oral QHS Charlsie Quest, MD   100 mg at 12/13/22 2128    Musculoskeletal: Strength & Muscle Tone: within normal limits Gait & Station: normal Patient leans: N/A            Psychiatric Specialty Exam:  Presentation  General Appearance:  Disheveled  Eye Contact: None  Speech: Clear and Coherent; Normal Rate  Speech Volume: Normal  Handedness: Right   Mood and Affect  Mood: Euthymic (Limited partcipation)  Affect: Appropriate; Congruent   Thought Process  Thought Processes: Coherent; Linear  Descriptions of Associations:Intact  Orientation:Full (Time, Place and Person)  Thought Content:Logical  History of Schizophrenia/Schizoaffective disorder:No data recorded Duration of Psychotic Symptoms:No data recorded Hallucinations:Hallucinations: None  Ideas of Reference:None  Suicidal Thoughts:Suicidal Thoughts: No  Homicidal Thoughts:Homicidal Thoughts: No   Sensorium  Memory: Immediate Poor (Limited partcipation)  Judgment: Other (comment) (Limited partcipation)  Insight: Other (comment) (Limited partcipation)   Executive Functions  Concentration: Fair  Attention Span: Fair  Recall: [employer] of Knowledge: Fair  Language: Fair   Psychomotor Activity  Psychomotor Activity:No data recorded  Assets  Assets: Communication Skills; Social Support; Housing; Leisure Time   Sleep  Sleep: Sleep: Good   Physical Exam: Physical Exam Vitals and nursing note reviewed.  Constitutional:      Appearance: He is underweight.  Eyes:     General: Lids are normal.        Right eye: Discharge present.        Left eye: Discharge present.    Comments: Bilateral eyes matted together, difficult to remove discharge/drainage despite vigorous rubbing with with face cloth.   Neurological:     General: No focal deficit present.     Mental Status: He is alert and easily aroused.     Sensory: Sensation is intact.     Comments: Limited partcipation, will not open eyes or engage. Minimal verbal responses  Psychiatric:        Attention and Perception: Attention and perception normal.        Speech: Speech normal.        Behavior: Behavior is cooperative.        Thought Content: Thought content normal.    Review of Systems  Unable to perform ROS: Patient unresponsive   Blood pressure (!) 131/99, pulse 94, temperature (!) 97.4 F (36.3 C), temperature source Oral, resp. rate 18, height 5\' 6"  (1.676 m), weight 52.5 kg, SpO2 94%. Body mass index is 18.67 kg/m.  Treatment Plan Summary: Daily contact with patient to assess and evaluate symptoms and progress  in treatment, Medication management, and Plan    -Will discontinue Cogentin 1 mg p.o. twice daily.  Consider switching patient to Ingrezza if managing tardive dyskinesia or concerned about EPS symptoms. -Will reduce dose of Clozaril 25 mg p.o. twice daily, due to excessive daytime sedation. -Will order ammonia level, valproic acid level, and CK level. -Continue close observation, there appear to be no acute safety concerns. -Eye lavage with gentle soap and water.   Psychiatry will continue to follow.   Disposition: No evidence of imminent risk to self or others at present.   Patient does not meet criteria for psychiatric inpatient admission. Supportive therapy provided about ongoing stressors. Discussed crisis plan, support from social network, calling 911, coming to the Emergency Department, and calling Suicide Hotline.  Maryagnes Amos, FNP 12/14/2022 4:08 PM

## 2022-12-14 NOTE — TOC Progression Note (Signed)
Transition of Care [facility]) - Progression Note    Patient Details  Name: Ronald Roman MRN: [id number] Date of Birth: Mar 14, 1961  Transition of Care [facility]) CM/SW Contact  Otelia Santee, LCSW Phone Number: 12/14/2022, 2:12 PM  Clinical Narrative:    Spoke with Lurena Joiner at [facility]F and confirmed pt is able to return once medically ready. FL-2 and DC summary will need to be faxed to facility prior to pt returning.  TOC will continue to follow for medical readiness.    Expected Discharge Plan: Assisted Living Barriers to Discharge: No Barriers Identified  Expected Discharge Plan and Services In-house Referral: Clinical Social Work Discharge Planning Services: NA Post Acute Care Choice: Resumption of Svcs/PTA Provider, Nursing Home Living arrangements for the past 2 months: Assisted Living Facility                 DME Arranged: N/A DME Agency: NA                   Social Determinants of Health (SDOH) Interventions SDOH Screenings   Food Insecurity: Patient Declined (11/02/2022)  Housing: Patient Unable To Answer (11/02/2022)  Transportation Needs: No Transportation Needs (11/02/2022)  Utilities: Patient Unable To Answer (12/12/2022)  Depression (PHQ2-9): Medium Risk (04/29/2021)  Tobacco Use: Medium Risk (Dec 18, 2022)    Readmission Risk Interventions    12/13/2022    3:30 PM  Readmission Risk Prevention Plan  Transportation Screening Complete  PCP or Specialist Appt within 5-7 Days Complete  Home Care Screening Complete  Medication Review (RN CM) Complete

## 2022-12-14 NOTE — Progress Notes (Signed)
Initial Nutrition Assessment  DOCUMENTATION CODES:   Non-severe (moderate) malnutrition in context of acute illness/injury  INTERVENTION:   -Ensure Plus High Protein po BID, each supplement provides 350 kcal and 20 grams of protein.   -Multivitamin with minerals daily  NUTRITION DIAGNOSIS:   Moderate Malnutrition related to acute illness (altered mental status) as evidenced by mild fat depletion, severe muscle depletion.  GOAL:   Patient will meet greater than or equal to 90% of their needs  MONITOR:   PO intake, Supplement acceptance, Labs, Weight trends, I & O's  REASON FOR ASSESSMENT:   Malnutrition Screening Tool    ASSESSMENT:   62 year old male with paranoid schizophrenia, DM2, HTN, seizure disorder who comes into the hospital from assisted living facility due to mental status change.  Normally he can hold conversation, walk, feed himself but has been having difficulties ambulating, incontinence, and speaking to himself and reporting hearing voices.  He was found to have acute kidney injury.  Patient lying in bed, alert/oriented x 1. Not able to give history at this time.  Per chart review, pt at baseline is able to feed himself. Now needs assistance with feeding.  Pt consumed 50% of breakfast this morning. Will order Ensure and daily MVI given poor PO.  Per weight records, pt has lost 7 lbs since 6/10 (5% wt loss x 1.5 months, insignificant for time frame).  Medications: Remeron, Miralax, Senokot  Labs reviewed: CBGs: 79 Low Mg   NUTRITION - FOCUSED PHYSICAL EXAM: Not able to fully participate. Flowsheet Row Most Recent Value  Orbital Region Mild depletion  Upper Arm Region Unable to assess  Thoracic and Lumbar Region Unable to assess  Buccal Region Moderate depletion  Temple Region Moderate depletion  Clavicle Bone Region Unable to assess  Clavicle and Acromion Bone Region Unable to assess  Scapular Bone Region Unable to assess  Dorsal Hand Unable to  assess  Patellar Region Severe depletion  Anterior Thigh Region Severe depletion  Posterior Calf Region Severe depletion  Edema (RD Assessment) None  Hair Reviewed  Eyes Unable to assess  [kept eyes closed]  Mouth Unable to assess  Skin Reviewed       Diet Order:   Diet Order             Diet regular Room service appropriate? Yes; Fluid consistency: Thin  Diet effective now                   EDUCATION NEEDS:   Not appropriate for education at this time  Skin:  Skin Assessment: Reviewed RN Assessment  Last BM:  7/22  Height:   Ht Readings from Last 1 Encounters:  12/12/22 5\' 6"  (1.676 m)    Weight:   Wt Readings from Last 1 Encounters:  12/12/22 52.5 kg    BMI:  Body mass index is 18.67 kg/m.  Estimated Nutritional Needs:   Kcal:  1600-1800  Protein:  75-95g  Fluid:  1.8L/day  Tilda Franco, MS, RD, LDN Inpatient Clinical Dietitian Contact information available via Amion

## 2022-12-14 NOTE — Progress Notes (Signed)
Physical Therapy Treatment Patient Details Name: Ronald Roman MRN: [id number] DOB: February 15, 1961 Today's Date: 12/14/2022   History of Present Illness "62 year old male with paranoid schizophrenia, DM2, HTN, seizure disorder who comes into the hospital from assisted living facility due to mental status change.  Normally he can hold conversation, walk, feed himself but has been having difficulties ambulating, incontinence, and speaking to himself and reporting hearing voices.  He was found to have acute kidney injury and was admitted to the hospital."    PT Comments  Per chart review, pt was able to amb and feed himself.   General Comments: Pt in bed with B mittens applied.  Restless.  In constant motion.  Nursing ahve to feed him as he is unable to control his movements or hold a cup/fork.  Presentds with MAX Movement Disorder of Dyskinesia and Ataxia.  Pt following repeat instructions.  Eyes closed most of session unless instructed to open.  Able to express basic needs "I'm cold", "my feet hurt", "My name is Ronald Roman".   Unable to recall current location/situation. Assisted OOB to amb was VERY difficult.  General bed mobility comments: pt able to initate but unable to control his movements to sustain sitting EOB.  B UE and LE's in constant Gross Motor motion and pt had x 2 posterior falls back onto bed.  General transfer comment: unable to use walker due to constant Gross Motor movements of B UE's.  + 2 side by side hand held assist to rise from bed.  Also presents with Dyskinesia of trunk with posterior lean.  Therapist providing MAX support to prevent total LOB.  Also asissted with a toilet transfer with near fall turning and stepping backward. General Gait Details: VERY unsteady gait requiring + 2 side by side hand held assist.  Gross Motor Movement dysfunction of high stepping, ataxia, postural instability and Dyskinesia. Pt unable to steady self and unable to use any AD.   HIGH FALL RISK       Assistance Recommended at Discharge Frequent or constant Supervision/Assistance  If plan is discharge home, recommend the following:  Can travel by private vehicle    A little help with bathing/dressing/bathroom;A little help with walking and/or transfers      Equipment Recommendations  None recommended by PT    Recommendations for Other Services       Precautions / Restrictions Precautions Precautions: Fall Precaution Comments: severe movement disorder of Ataxia/Dyskinesia Restrictions Weight Bearing Restrictions: No     Mobility  Bed Mobility Overal bed mobility: Needs Assistance Bed Mobility: Supine to Sit     Supine to sit: Max assist     General bed mobility comments: pt able to initate but unable to control his movements to sustain sitting EOB.  B UE and LE's in constant Gross Motor motion and pt had x 2 posterior falls back onto bed.    Transfers Overall transfer level: Needs assistance Equipment used: None Transfers: Sit to/from Stand Sit to Stand: +2 physical assistance, Max assist           General transfer comment: unable to use walker due to constant Gross Motor movements of B UE's.  + 2 side by side hand held assist to rise from bed.  Also presents with Dyskinesia of trunk with posterior lean.  Therapist providing MAX support to prevent total LOB.  Also asissted with a toilet transfer with near fall turning and stepping backward.    Ambulation/Gait Ambulation/Gait assistance: Max assist, Total assist, +2 physical assistance, +  2 safety/equipment Gait Distance (Feet): 65 Feet Assistive device: None Gait Pattern/deviations: Step-to pattern, Narrow base of support, Ataxic, Antalgic, Leaning posteriorly, Staggering left, Staggering right Gait velocity: decreased     General Gait Details: VERY unsteady gait requiring + 2 side by side hand held assist.  Gross Motor Movement dysfunction similiar to [place]'s Chorea of high stepping, ataxia,  postural instability and Dyskinesia. Pt unable to steady self and unable to use any AD.   HIGH FALL RISK   Stairs             Wheelchair Mobility     Tilt Bed    Modified Rankin (Stroke Patients Only)       Balance                                            Cognition Arousal/Alertness: Awake/alert Behavior During Therapy: Flat affect, Restless                                   General Comments: Pt in bed with B mittens applied.  Restless.  In constant motion.  Nursing ahve to feed him as he is unable to control his movements or hold a cup/fork.  Presentds with MAX Movement Disorder of Dyskinesia and Ataxia.  Pt following repeat instructions.  Eyes closed most of session unless instructed to open.  Able to express basic needs "I'm cold", "my feet hurt", "My name is Ronald Roman".   Unable to recall current location/situation.        Exercises      General Comments        Pertinent Vitals/Pain Pain Assessment Pain Assessment: Faces Faces Pain Scale: Hurts a little bit Pain Location: "my feet" pt amb with grip socks Pain Descriptors / Indicators: Discomfort, Grimacing Pain Intervention(s): Monitored during session    Home Living                          Prior Function            PT Goals (current goals can now be found in the care plan section) Progress towards PT goals: Progressing toward goals    Frequency    Min 1X/week      PT Plan Current plan remains appropriate    Co-evaluation              AM-PAC PT "6 Clicks" Mobility   Outcome Measure  Help needed turning from your back to your side while in a flat bed without using bedrails?: A Lot Help needed moving from lying on your back to sitting on the side of a flat bed without using bedrails?: A Lot Help needed moving to and from a bed to a chair (including a wheelchair)?: A Lot Help needed standing up from a chair using your arms (e.g., wheelchair  or bedside chair)?: A Lot Help needed to walk in hospital room?: Total Help needed climbing 3-5 steps with a railing? : Total 6 Click Score: 10    End of Session Equipment Utilized During Treatment: Gait belt Activity Tolerance: Treatment limited secondary to medical complications (Comment) Patient left: in bed;with call bell/phone within reach;with bed alarm set Nurse Communication: Mobility status PT Visit Diagnosis: Difficulty in walking, not elsewhere classified (R26.2)     Time:  1610-9604 PT Time Calculation (min) (ACUTE ONLY): 23 min  Charges:    $Gait Training: 8-22 mins $Therapeutic Activity: 8-22 mins PT General Charges $$ ACUTE PT VISIT: 1 Visit                     Felecia Shelling  PTA Acute  Rehabilitation Services Office M-F          [phone]

## 2022-12-14 NOTE — Plan of Care (Signed)

## 2022-12-14 NOTE — Progress Notes (Signed)
PROGRESS NOTE  Ronald Roman EPP:[id number] DOB: Oct 08, 1960 DOA: 2023/01/01 PCP: [facility]   LOS: 3 days   Brief Narrative / Interim history: 62 year old male with paranoid schizophrenia, DM2, HTN, seizure disorder who comes into the hospital from assisted living facility due to mental status change.  Normally he can hold conversation, walk, feed himself but has been having difficulties ambulating, incontinence, and speaking to himself and reporting hearing voices.  He was found to have acute kidney injury and was admitted to the hospital.  Workup in the ED included a CT of the head which was negative for acute processes, chest x-ray was negative for pneumonia, and urinalysis was negative for UTI.  UDS was negative  Subjective / 24h Interval events: Remains confused, appears to have auditory hallucination as he is often talking by himself.  He denies when asked.  Has been having increased jerky gross motor movements  Assesement and Plan: Principal Problem:   Acute kidney injury (HCC) Active Problems:   AMS (altered mental status)   Diabetes mellitus without complication (HCC)   Hypertension   Paranoid schizophrenia (HCC)   AKI (acute kidney injury) (HCC)   Seizure disorder (HCC)   Malnutrition of moderate degree   Principal problem Acute kidney injury -possibly related to dehydration, CT scan of the abdomen pelvis done showed potentially ileus and significant constipation, which can definitely lead to poor p.o. intake.  -With fluids, his creatinine improved significantly and his mental status appears better -Still with poor p.o. intake, keeping fluids  Active problems Altered mental status, ataxia, dyskinesia -persistent despite correction of his rate kidney function.  Psychiatry consulted today.  CT head negative, UA unremarkable, CT abdomen pelvis only showed significant constipation  Constipation, ileus-excellent results with enema yesterday. Continue aggressive  bowel regimen with MiraLAX, Senokot   Paranoid schizophrenia - Continue clozapine, amantadine, benztropine, Seroquel, trazodone.  Psychiatry consulted   Type 2 diabetes - Holding metformin and Farxiga with AKI.  A1c well-controlled at 6.1.  Potentially could hold his medications with recurrent AKI   Hypertension - BP is stable.  Taken off lisinopril last month due to issues with orthostasis.  Continue to monitor.   Seizure disorder - No reported seizure activity.  Continue Keppra and Depakote.  Scheduled Meds:  amantadine  100 mg Oral BID   benztropine  1 mg Oral BID   cloZAPine  50 mg Oral BID   divalproex  500 mg Oral BID   feeding supplement  237 mL Oral BID BM   heparin  5,000 Units Subcutaneous Q8H   levETIRAcetam  500 mg Oral BID   mirtazapine  15 mg Oral QHS   multivitamin with minerals  1 tablet Oral Daily   polyethylene glycol  17 g Oral BID   QUEtiapine  200 mg Oral QHS   QUEtiapine  50 mg Oral BID BM   senna-docusate  2 tablet Oral BID   traZODone  100 mg Oral QHS   Continuous Infusions:  sodium chloride 125 mL/hr at 12/12/22 2341   PRN Meds:.acetaminophen **OR** acetaminophen, hydrOXYzine, senna-docusate  Current Outpatient Medications  Medication Instructions   amantadine (SYMMETREL) 100 mg, Oral, 2 times daily   benztropine (COGENTIN) 1 mg, Oral, 2 times daily   cetirizine (ZYRTEC) 10 mg, Oral, Daily   cloZAPine (CLOZARIL) 25 MG tablet Take 1 tablet (25 mg total) by mouth 2 (two) times daily for 3 days, THEN 2 tablets (50 mg total) 2 (two) times daily. Needs prompt follow up with outpatient psychiatry and  intermittent orthostatic checks.   dapagliflozin propanediol (FARXIGA) 10 mg, Oral, Daily after breakfast   divalproex (DEPAKOTE ER) 500 mg, Oral, 2 times daily   ergocalciferol (VITAMIN D2) 50,000 Units, Oral, Weekly, Thursday    hydrOXYzine (ATARAX) 25 mg, Oral, 2 times daily PRN   levETIRAcetam (KEPPRA) 500 mg, Oral, 2 times daily   magnesium oxide (MAG-OX)  400 mg, Oral, Daily   metFORMIN (GLUCOPHAGE) 1,000 mg, Oral, 2 times daily with meals   mirtazapine (REMERON) 15 mg, Oral, Daily at bedtime   polyethylene glycol (MIRALAX / GLYCOLAX) 17 g, Oral, 2 times daily   QUEtiapine (SEROQUEL) 200 mg, Oral, Daily at bedtime   QUEtiapine (SEROQUEL) 50 mg, Oral, See admin instructions, Take 50 mg (1 tablet) by mouth every morning and at 3 pm.   traZODone (DESYREL) 100 mg, Oral, Daily at bedtime   zolpidem (AMBIEN) 5 mg, Oral, At bedtime PRN    Diet Orders (From admission, onward)     Start     Ordered   12/11/22 0013  Diet regular Room service appropriate? Yes; Fluid consistency: Thin  Diet effective now       Question Answer Comment  Room service appropriate? Yes   Fluid consistency: Thin      12/11/22 0013            DVT prophylaxis: heparin injection 5,000 Units Start: 12/11/22 0600   Lab Results  Component Value Date   PLT 163 12/14/2022      Code Status: Full Code  Family Communication: no family at bedside, updated POA over the phone  Status is: Inpatient  Level of care: Med-Surg  Consultants:  none  Objective: Vitals:   12/13/22 0427 12/13/22 1333 12/13/22 1919 12/14/22 0556  BP: (!) 142/79 128/65 (!) 139/111 (!) 145/112  Pulse: 83 97 95 (!) 106  Resp: 18 19 18 18   Temp: 98.4 F (36.9 C) 98.3 F (36.8 C) 98 F (36.7 C) (!) 97.4 F (36.3 C)  TempSrc: Oral Oral Oral Oral  SpO2: 97% 98%    Weight:      Height:        Intake/Output Summary (Last 24 hours) at 12/14/2022 1137 Last data filed at 12/14/2022 0900 Gross per 24 hour  Intake 3230 ml  Output --  Net 3230 ml   Wt Readings from Last 3 Encounters:  12/12/22 52.5 kg  11/19/22 55.9 kg  11/16/22 55.9 kg    Examination:  Constitutional: NAD Eyes: lids and conjunctivae normal, no scleral icterus ENMT: mmm Neck: normal, supple Respiratory: clear to auscultation bilaterally, no wheezing, no crackles. Normal respiratory effort.  Cardiovascular:  Regular rate and rhythm, no murmurs / rubs / gallops. No LE edema. Abdomen: soft, no distention, no tenderness. Bowel sounds positive.  Skin: no rashes Neurologic: Increased involuntary movements today  Data Reviewed: I have independently reviewed following labs and imaging studies   CBC Recent Labs  Lab 12/09/2022 2226 12/11/22 0540 12/14/22 0553  WBC 9.2 9.9 9.5  HGB 10.1* 9.5* 9.1*  HCT 31.0* 29.6* 28.7*  PLT 178 170 163  MCV 81.6 82.9 85.2  MCH 26.6 26.6 27.0  MCHC 32.6 32.1 31.7  RDW 14.4 14.6 15.0  LYMPHSABS 1.4  --   --   MONOABS 0.8  --   --   EOSABS 0.1  --   --   BASOSABS 0.0  --   --     Recent Labs  Lab 12/07/2022 2209 12/11/2022 2226 12/11/22 0540 12/12/22 0543 12/13/22 0451 12/14/22 9562  NA  --  135 135 137 136 138  K  --  4.9 4.9 4.4 4.1 3.7  CL  --  102 104 107 105 111  CO2  --  23 21* 22 22 19*  GLUCOSE  --  79 90 77 82 98  BUN  --  50* 50* 29* 19 19  CREATININE  --  2.07* 1.92* 1.28* 1.07 0.90  CALCIUM  --  9.6 9.2 9.2 9.1 9.1  AST  --  25  --   --   --   --   ALT  --  14  --   --   --   --   ALKPHOS  --  85  --   --   --   --   BILITOT  --  0.7  --   --   --   --   ALBUMIN  --  4.0  --   --   --   --   MG  --   --   --  1.5* 1.8 1.5*  HGBA1C  --   --  6.1*  --   --   --   AMMONIA 19  --   --   --   --   --     ------------------------------------------------------------------------------------------------------------------ No results for input(s): "CHOL", "HDL", "LDLCALC", "TRIG", "CHOLHDL", "LDLDIRECT" in the last 72 hours.  Lab Results  Component Value Date   HGBA1C 6.1 (H) 12/11/2022   ------------------------------------------------------------------------------------------------------------------ No results for input(s): "TSH", "T4TOTAL", "T3FREE", "THYROIDAB" in the last 72 hours.  Invalid input(s): "FREET3"  Cardiac Enzymes No results for input(s): "CKMB", "TROPONINI", "MYOGLOBIN" in the last 168 hours.  Invalid input(s):  "CK" ------------------------------------------------------------------------------------------------------------------    Component Value Date/Time   BNP 94.0 08/22/2022 1512    CBG: Recent Labs  Lab 2023-01-04 2236  GLUCAP 79    No results found for this or any previous visit (from the past 240 hour(s)).   Radiology Studies: No results found.   Pamella Pert, MD, PhD [facility]  Between 7 am - 7 pm I am available, please contact me via Amion (for emergencies) or Securechat (non urgent messages)  Between 7 pm - 7 am I am not available, please contact night coverage MD/APP via Amion

## 2022-12-15 ENCOUNTER — Inpatient Hospital Stay (HOSPITAL_COMMUNITY): Payer: MEDICAID

## 2022-12-15 DIAGNOSIS — N179 Acute kidney failure, unspecified: Principal | ICD-10-CM

## 2022-12-15 DIAGNOSIS — J80 Acute respiratory distress syndrome: Secondary | ICD-10-CM

## 2022-12-15 DIAGNOSIS — R4182 Altered mental status, unspecified: Secondary | ICD-10-CM

## 2022-12-15 DIAGNOSIS — R451 Restlessness and agitation: Secondary | ICD-10-CM

## 2022-12-15 DIAGNOSIS — I10 Essential (primary) hypertension: Secondary | ICD-10-CM

## 2022-12-15 DIAGNOSIS — K5903 Drug induced constipation: Secondary | ICD-10-CM

## 2022-12-15 DIAGNOSIS — Z9911 Dependence on respirator [ventilator] status: Secondary | ICD-10-CM

## 2022-12-15 LAB — CBC
HCT: 24.7 % — ABNORMAL LOW (ref 39.0–52.0)
Hemoglobin: 8 g/dL — ABNORMAL LOW (ref 13.0–17.0)
MCH: 26.9 pg (ref 26.0–34.0)
MCHC: 32.4 g/dL (ref 30.0–36.0)
MCV: 83.2 fL (ref 80.0–100.0)
Platelets: 160 10*3/uL (ref 150–400)
RBC: 2.97 MIL/uL — ABNORMAL LOW (ref 4.22–5.81)
RDW: 14.8 % (ref 11.5–15.5)
WBC: 10.1 10*3/uL (ref 4.0–10.5)
nRBC: 0 % (ref 0.0–0.2)

## 2022-12-15 LAB — COMPREHENSIVE METABOLIC PANEL
ALT: 13 U/L (ref 0–44)
AST: 26 U/L (ref 15–41)
Albumin: 3.2 g/dL — ABNORMAL LOW (ref 3.5–5.0)
Alkaline Phosphatase: 76 U/L (ref 38–126)
Anion gap: 9 (ref 5–15)
BUN: 32 mg/dL — ABNORMAL HIGH (ref 8–23)
CO2: 21 mmol/L — ABNORMAL LOW (ref 22–32)
Calcium: 9.3 mg/dL (ref 8.9–10.3)
Chloride: 109 mmol/L (ref 98–111)
Creatinine, Ser: 1.19 mg/dL (ref 0.61–1.24)
GFR, Estimated: >60 mL/min (ref >60)
Glucose, Bld: 136 mg/dL — ABNORMAL HIGH (ref 70–99)
Potassium: 4.5 mmol/L (ref 3.5–5.1)
Sodium: 139 mmol/L (ref 135–145)
Total Bilirubin: 0.5 mg/dL (ref 0.3–1.2)
Total Protein: 6.6 g/dL (ref 6.5–8.1)

## 2022-12-15 LAB — BLOOD GAS, ARTERIAL
Acid-base deficit: 9 mmol/L — ABNORMAL HIGH (ref 0.0–2.0)
Bicarbonate: 19.6 mmol/L — ABNORMAL LOW (ref 20.0–28.0)
Drawn by: 25770
FIO2: 100 %
MECHVT: 510 mL
O2 Saturation: 98.3 %
PEEP: 5 cmH2O
Patient temperature: 37
RATE: 18 resp/min
pCO2 arterial: 59 mmHg — ABNORMAL HIGH (ref 32–48)
pH, Arterial: 7.13 — CL (ref 7.35–7.45)
pO2, Arterial: 334 mmHg — ABNORMAL HIGH (ref 83–108)

## 2022-12-15 LAB — BLOOD GAS, VENOUS
Acid-base deficit: 15.2 mmol/L — ABNORMAL HIGH (ref 0.0–2.0)
Bicarbonate: 17.7 mmol/L — ABNORMAL LOW (ref 20.0–28.0)
O2 Saturation: 12.9 %
Patient temperature: 37
pCO2, Ven: 77 mmHg (ref 44–60)
pH, Ven: 6.97 — CL (ref 7.25–7.43)
pO2, Ven: <31 mmHg — CL (ref 32–45)

## 2022-12-15 LAB — MRSA NEXT GEN BY PCR, NASAL: MRSA by PCR Next Gen: NOT DETECTED

## 2022-12-15 LAB — CULTURE, RESPIRATORY W GRAM STAIN

## 2022-12-15 LAB — MAGNESIUM: Magnesium: 2.3 mg/dL (ref 1.7–2.4)

## 2022-12-15 LAB — GLUCOSE, CAPILLARY: Glucose-Capillary: 219 mg/dL — ABNORMAL HIGH (ref 70–99)

## 2022-12-15 LAB — LACTIC ACID, PLASMA: Lactic Acid, Venous: 8.5 mmol/L (ref 0.5–1.9)

## 2022-12-15 LAB — HIV ANTIBODY (ROUTINE TESTING W REFLEX): HIV Screen 4th Generation wRfx: NONREACTIVE

## 2022-12-15 MED ORDER — SODIUM CHLORIDE 0.9 % IV SOLN: 2.0000 g | Freq: Two times a day (BID) | INTRAVENOUS | Status: DC

## 2022-12-15 MED ORDER — VANCOMYCIN HCL 750 MG/150ML IV SOLN: 750.0000 mg | INTRAVENOUS | Status: DC

## 2022-12-15 MED ORDER — NOREPINEPHRINE 4 MG/250ML-% IV SOLN
2.0000 ug/min | INTRAVENOUS | Status: DC
  Administered 2022-12-15: 8 ug/min via INTRAVENOUS
  Filled 2022-12-15 (×3): qty 250

## 2022-12-15 MED ORDER — METRONIDAZOLE 500 MG/100ML IV SOLN
500.0000 mg | Freq: Two times a day (BID) | INTRAVENOUS | Status: DC
  Filled 2022-12-15: qty 100

## 2022-12-15 MED ORDER — FENTANYL CITRATE (PF) 100 MCG/2ML IJ SOLN
INTRAMUSCULAR | Status: AC
  Filled 2022-12-15: qty 2

## 2022-12-15 MED ORDER — PANTOPRAZOLE SODIUM 40 MG IV SOLR
40.0000 mg | Freq: Every day | INTRAVENOUS | Status: DC
  Filled 2022-12-15: qty 10

## 2022-12-15 MED ORDER — PEG-KCL-NACL-NASULF-NA ASC-C 100 G PO SOLR
1.0000 | Freq: Once | ORAL | Status: AC
  Administered 2022-12-15: 200 g via ORAL
  Filled 2022-12-15: qty 1

## 2022-12-15 MED ORDER — LACTATED RINGERS IV SOLN: INTRAVENOUS | Status: DC

## 2022-12-15 MED ORDER — HYDROCORTISONE SOD SUC (PF) 100 MG IJ SOLR
50.0000 mg | Freq: Four times a day (QID) | INTRAMUSCULAR | Status: DC
  Administered 2022-12-15: 50 mg via INTRAVENOUS
  Filled 2022-12-15: qty 2

## 2022-12-15 MED ORDER — VASOPRESSIN 20 UNITS/100 ML INFUSION FOR SHOCK
INTRAVENOUS | Status: AC
  Filled 2022-12-15: qty 100

## 2022-12-15 MED ORDER — LEVETIRACETAM IN NACL 500 MG/100ML IV SOLN
500.0000 mg | Freq: Two times a day (BID) | INTRAVENOUS | Status: DC
  Filled 2022-12-15: qty 100

## 2022-12-15 MED ORDER — CHLORHEXIDINE GLUCONATE CLOTH 2 % EX PADS: 6.0000 | MEDICATED_PAD | Freq: Every day | CUTANEOUS | Status: DC

## 2022-12-15 MED ORDER — EPINEPHRINE HCL 5 MG/250ML IV SOLN IN NS
0.5000 ug/min | INTRAVENOUS | Status: DC
  Administered 2022-12-15: 0.5 ug/min via INTRAVENOUS

## 2022-12-15 MED ORDER — SODIUM CHLORIDE 0.9 % IV SOLN
2.0000 g | INTRAVENOUS | Status: AC
  Administered 2022-12-15: 2 g via INTRAVENOUS
  Filled 2022-12-15: qty 12.5

## 2022-12-15 MED ORDER — FAMOTIDINE IN NACL 20-0.9 MG/50ML-% IV SOLN: 20.0000 mg | Freq: Two times a day (BID) | INTRAVENOUS | Status: DC

## 2022-12-15 MED ORDER — ORAL CARE MOUTH RINSE
15.0000 mL | OROMUCOSAL | Status: DC
  Administered 2022-12-15: 15 mL via OROMUCOSAL

## 2022-12-15 MED ORDER — INSULIN ASPART 100 UNIT/ML IJ SOLN: 1.0000 [IU] | INTRAMUSCULAR | Status: DC

## 2022-12-15 MED ORDER — POLYETHYLENE GLYCOL 3350 17 G PO PACK: 17.0000 g | PACK | Freq: Every day | ORAL | Status: DC | PRN

## 2022-12-15 MED ORDER — PROPOFOL 1000 MG/100ML IV EMUL
0.0000 ug/kg/min | INTRAVENOUS | Status: DC
  Administered 2022-12-15: 5 ug/kg/min via INTRAVENOUS
  Filled 2022-12-15 (×2): qty 100

## 2022-12-15 MED ORDER — ORAL CARE MOUTH RINSE: 15.0000 mL | OROMUCOSAL | Status: DC | PRN

## 2022-12-15 MED ORDER — INSULIN ASPART 100 UNIT/ML IJ SOLN: 0.0000 [IU] | Freq: Three times a day (TID) | INTRAMUSCULAR | Status: DC

## 2022-12-15 MED ORDER — LACTATED RINGERS IV BOLUS
2000.0000 mL | Freq: Once | INTRAVENOUS | Status: AC
  Administered 2022-12-15: 2000 mL via INTRAVENOUS

## 2022-12-15 MED ORDER — FENTANYL CITRATE (PF) 100 MCG/2ML IJ SOLN
100.0000 ug | Freq: Once | INTRAMUSCULAR | Status: AC
  Filled 2022-12-15: qty 2

## 2022-12-15 MED ORDER — SODIUM CHLORIDE 0.9 % IV SOLN
250.0000 mL | INTRAVENOUS | Status: DC
  Administered 2022-12-15: 250 mL via INTRAVENOUS

## 2022-12-15 MED ORDER — DOCUSATE SODIUM 50 MG/5ML PO LIQD: 100.0000 mg | Freq: Two times a day (BID) | ORAL | Status: DC

## 2022-12-15 MED ORDER — FENTANYL BOLUS VIA INFUSION: 50.0000 ug | INTRAVENOUS | Status: DC | PRN

## 2022-12-15 MED ORDER — VASOPRESSIN 20 UNITS/100 ML INFUSION FOR SHOCK
0.0000 [IU]/min | INTRAVENOUS | Status: DC
  Administered 2022-12-15: 0.03 [IU]/min via INTRAVENOUS
  Filled 2022-12-15: qty 100

## 2022-12-15 MED ORDER — FENTANYL 2500MCG IN NS 250ML (10MCG/ML) PREMIX INFUSION: 50.0000 ug/h | INTRAVENOUS | Status: DC

## 2022-12-15 MED ORDER — NOREPINEPHRINE 4 MG/250ML-% IV SOLN: 0.0000 ug/min | INTRAVENOUS | Status: DC

## 2022-12-15 MED ORDER — KETAMINE HCL 50 MG/5ML IJ SOSY
50.0000 mg | PREFILLED_SYRINGE | Freq: Once | INTRAMUSCULAR | Status: AC
  Administered 2022-12-15: 50 mg via INTRAVENOUS

## 2022-12-15 MED ORDER — KETAMINE HCL 50 MG/5ML IJ SOSY
PREFILLED_SYRINGE | INTRAMUSCULAR | Status: AC
  Filled 2022-12-15: qty 5

## 2022-12-15 MED ORDER — DOCUSATE SODIUM 100 MG PO CAPS: 100.0000 mg | ORAL_CAPSULE | Freq: Two times a day (BID) | ORAL | Status: DC | PRN

## 2022-12-15 MED ORDER — FENTANYL CITRATE PF 50 MCG/ML IJ SOSY: 50.0000 ug | PREFILLED_SYRINGE | Freq: Once | INTRAMUSCULAR | Status: DC

## 2022-12-15 MED ORDER — SORBITOL 70 % SOLN
960.0000 mL | TOPICAL_OIL | Freq: Once | ORAL | Status: AC
  Administered 2022-12-15: 960 mL via RECTAL
  Filled 2022-12-15: qty 240

## 2022-12-15 MED ORDER — HALOPERIDOL LACTATE 5 MG/ML IJ SOLN: 5.0000 mg | Freq: Once | INTRAMUSCULAR | Status: DC

## 2022-12-15 MED ORDER — VALPROATE SODIUM 100 MG/ML IV SOLN
500.0000 mg | Freq: Two times a day (BID) | INTRAVENOUS | Status: DC
  Filled 2022-12-15 (×2): qty 5

## 2022-12-15 MED ORDER — VANCOMYCIN HCL 1250 MG/250ML IV SOLN
1250.0000 mg | INTRAVENOUS | Status: AC
  Administered 2022-12-15: 1250 mg via INTRAVENOUS
  Filled 2022-12-15: qty 250

## 2022-12-15 MED ORDER — VALPROATE SODIUM 100 MG/ML IV SOLN
250.0000 mg | Freq: Four times a day (QID) | INTRAVENOUS | Status: DC
  Filled 2022-12-15 (×3): qty 2.5

## 2022-12-15 MED ORDER — LORAZEPAM 2 MG/ML IJ SOLN: 2.0000 mg | Freq: Once | INTRAMUSCULAR | Status: DC

## 2022-12-15 MED FILL — Medication: Qty: 1 | Status: AC

## 2022-12-16 LAB — CULTURE, RESPIRATORY W GRAM STAIN

## 2022-12-16 LAB — RPR: RPR Ser Ql: NONREACTIVE

## 2022-12-23 NOTE — Progress Notes (Signed)
IV team consult placed for [facility] PIV. Upon arrival to room, MD stated he was going to place a central line.

## 2022-12-23 NOTE — Progress Notes (Signed)
Mobility Specialist - Progress Note   12/12/2022 1300  Mobility  Activity Transferred to/from [facility]  Level of Assistance +2 (takes two people)  Assistive Device None  Distance Ambulated (ft) 3 ft  Range of Motion/Exercises Active  Activity Response Tolerated well  $Mobility charge 1 Mobility  Mobility Specialist Start Time (ACUTE ONLY) 1255  Mobility Specialist Stop Time (ACUTE ONLY) 1300  Mobility Specialist Time Calculation (min) (ACUTE ONLY) 5 min   Went into room due to bed alarm, assisted pt to [facility], back to bed with all needs met. Alarm on  Marilynne Halsted Mobility Specialist

## 2022-12-23 NOTE — Progress Notes (Signed)
   12/18/2022 1616  Spiritual Encounters  Type of Visit Initial  Care provided to: Pt not available  Referral source Code page  Reason for visit Code  OnCall Visit No   Chaplain responded to code blue. No family was present. Chaplain alerted ICU secretary to page spiritual care when family arrives.   Arlyce Dice, Chaplain Resident

## 2022-12-23 NOTE — Progress Notes (Signed)
Late Entry. MD notified about SMOG enema not working. Orders for Moviprep given. Pt received few sips of moviprep. Could not tolerate sips of moviprep. MD notified that patient could not tolerate Moviprep. Pt was alert but restless in bed. RN stepped out of room for few moments to get suction for precaution. When RN returned to room, pt was aspirating dark brown contents and was not responsive. RN called code. Consulting civil engineer notified. Code team came to bedside.

## 2022-12-23 NOTE — Progress Notes (Signed)
Discuss with legal guardian code status and what it meant. She relates that he has been deteriorating over the last several months and that she has notice a significant decline in his condition with multiple complication and visit with admission to the hospital for the similar events. She is going to think about his condition and prognosis and discuss with [place] in the upcoming days, the situation and progress of his condition.

## 2022-12-23 NOTE — Progress Notes (Signed)
eLink Physician-Brief Progress Note Patient Name: Ronald Roman DOB: 1961/05/20 MRN: [id number]   Date of Service  12/16/22  HPI/Events of Note  62 year old with a history of schizophrenia, hypertension, diabetes and seizure disorder who initially presented with behavioral changes and hallucination found to have an AKI, encephalopathy, and severe constipation who earlier in the day developed feculent emesis, aspiration, and went into septic shock with acute respiratory distress syndrome.  She has had multiple episodes of bradycardia with near arrest, escalating pressor requirements, and worsening ARDS.  The patient is rapidly declining and is at high propensity to pass away in the next few hours.  Patient is a ward of the state and has no immediate decision makers available.    eICU Interventions  Given the extremely poor prognosis and rapid clinical decline, I agree with Dr. Chestine Spore in pursuing a two-physician DO NOT RESUSCITATE order.     Intervention Category Intermediate Interventions: Communication with other healthcare providers and/or family  Ronald Roman 12-16-2022, 7:18 PM

## 2022-12-23 NOTE — Procedures (Signed)
Intubation Procedure Note  MUKUND WEINREB  [id number]  10-04-1960  Date:12/04/2022  Time:8:10 PM   Provider Performing:Pete E Tanja Port    Procedure: Endotracheal tube exchange  Indication(s) Respiratory Failure  Consent Unable to obtain consent due to emergent nature of procedure.   Anesthesia none   Time Out Verified patient identification, verified procedure, site/side was marked, verified correct patient position, special equipment/implants available, medications/allergies/relevant history reviewed, required imaging and test results available.   Sterile Technique Usual hand hygeine, masks, and gloves were used   Procedure Description Patient positioned in bed supine.  Placement inspected by bedside bronchoscopy, following that tube exchanger placed, old endotracheal tube with ruptured cuff removed new size 8 endotracheal tube placed without difficulty end-tidal CO2 confirmed, also confirmed bronchoscopically  Complications/Tolerance None; patient tolerated the procedure well. Chest X-ray is ordered to verify placement.   EBL Minimal   Specimen(s) None

## 2022-12-23 NOTE — Progress Notes (Addendum)
Pharmacy Antibiotic Note  Ronald Roman is a 62 y.o. male admitted on 12/16/2022 with  AMS , AKI, constipation/ileus. Pt had massive aspiration event 01-14-23, code blue.   Pharmacy has been consulted for cefepime and vancomycin dosing.  Today, 01/14/23 WBC WNL AKI resolved. SCr 1.19, CrCl ~48 mL/min Total body weight < ideal body weight. Use total body weight for dose calculations  Plan: Cefepime 2 g IV q12h + metronidazole 500 mg IV q12h Vancomycin 1250 mg loading dose followed by 750 mg IV q24h for estimated AUC of 445 Goal AUC 400-550 Monitor renal function, culture data  Height: 5\' 6"  (167.6 cm) Weight: 52.5 kg (115 lb 11.2 oz) IBW/kg (Calculated) : 63.8  Temp (24hrs), Avg:98.7 F (37.1 C), Min:98.7 F (37.1 C), Max:98.7 F (37.1 C)  Recent Labs  Lab 12/05/2022 2226 12/11/22 0540 12/12/22 0543 12/13/22 0451 12/14/22 0553 January 14, 2023 0544 01/14/2023 0639  WBC 9.2 9.9  --   --  9.5  --  10.1  CREATININE 2.07* 1.92* 1.28* 1.07 0.90 1.19  --     Estimated Creatinine Clearance: 48.4 mL/min (by C-G formula based on SCr of 1.19 mg/dL).    Allergies  Allergen Reactions   Cogentin [Benztropine] Other (See Comments)    Not documented on the [facility]   Penicillins Other (See Comments)    Not documented on the [facility]    Prolixin [Fluphenazine] Other (See Comments)    Not documented on the [facility]    Antimicrobials this admission: vancomycin January 14, 2023 >>  cefepime 2023-01-14 >>  Metronidazole 01/14/23 >>  Dose adjustments this admission:  Microbiology results:  Cindi Carbon, PharmD 01-14-23 4:26 PM

## 2022-12-23 NOTE — Code Documentation (Signed)
62 year old with PMH schizophrenia, hypertension, diabetes type 2, seizure disorder who presents with behavioral change and hallucination, he was found to have AKI, acute metabolic encephalopathy and severe constipation he had an enema in the morning, no results. He couldn't tolerate Moviprep. Nurse step out side few second when she  return  he was vomiting, and went into  cardiac arrest, there was stool all over bed. Code blue was called. Patient was in PEA. Chest compression started, patient received 3- runs of Epi. Amp bicarb, calcium gluconate. Anesthesia proceed to intubate patient, large amount of stool content was suction, subsequently patient ROS return after 12 minutes of chest compression and runs of Epi. Dr Earlene Plater,  ED physician run the code, I assisted with Code blue. CCM was consulted, patient to be started on Epinephrine Gtt. CMM came at bedside. Dr Radonna Ricker arrived to bedside. Dr Radonna Ricker contacted Family. Patient was transfer to ICU>

## 2022-12-23 NOTE — Progress Notes (Signed)
[facility] PROGRESS NOTE    Progress Note  Ronald Roman  YQM:[id number] DOB: 07/20/1960 DOA: 12/06/2022 PCP: [facility]     Brief Narrative:   Ronald Roman is an 62 y.o. male past medical history of paranoid schizophrenia, diabetes mellitus type 2 seizure disorder comes into the hospital from assisted living facility due to acute metabolic encephalopathy in the ED he was found to be in acute kidney injury   Assessment/Plan:   Acute kidney injury (HCC) CT scan of the abdomen and pelvis showed ileus with significant constipation. Likely prerenal acute kidney injury resolved with fluid resuscitation. Still having poor oral intake.  Acute metabolic encephalopathy/ataxia/dyskinesia: Persist despite correction of his renal function. Imaging and infectious workup was unrevealing. Psychiatry was consulted recommended discontinue Cogentin switched to Ingrezza, reduce chlorazanil. Ammonia level barely elevated at 38 CK 246 Further management per psychiatry.  Constipation: On MiraLAX and Senokot, abdomen is distended. Will give him a smog.  Paranoid schizophrenia: Now on colonoscopy and and Depakote. Further management per psychiatry.  Diabetes mellitus type 2: Oral hypoglycemic agents were held due to acute kidney injury. A1c of 6.1, can resume oral hypoglycemic agent as an outpatient.  Essential hypertension: Lisinopril held due to acute kidney injury. Blood pressure is slowly trending up. There were some episodes of orthostasis can go to ALF off lisinopril and would be resumed as an outpatient.  Seizure disorder: Continue Keppra and Depakote.  DVT prophylaxis: lovenox Family Communication:none Status is: Inpatient Remains inpatient appropriate because: Acute metabolic encephalopathy    Code Status:     Code Status Orders  (From admission, onward)           Start     Ordered   12/11/22 0013  Full code  Continuous       Question:   By:  Answer:  Consent: discussion documented in EHR   12/11/22 0013           Code Status History     Date Active Date Inactive Code Status Order ID Comments User Context   10/31/2022 2153 11/03/2022 2219 Full Code [id number]  [name], DO ED   08/15/2022 1042 08/23/2022 2050 Full Code [id number]  Coralyn Helling, MD ED   11/13/2020 2028 11/14/2020 1943 Full Code [id number]  Jackelyn Poling, NP ED   10/23/2020 0219 10/28/2020 1938 Full Code [id number]  [name], DO ED   10/22/2020 2347 10/23/2020 0219 Full Code [id number]  Benjiman Core, MD ED   10/11/2020 1807 10/15/2020 1724 Full Code [id number]  Emeline General, MD ED   10/09/2020 1921 10/10/2020 1717 Full Code [id number]  Little, Ambrose Finland, MD ED   05/07/2020 1510 05/08/2020 1813 Full Code [id number]  Sabino Dick, DO ED   02/09/2020 2015 02/12/2020 2030 Full Code [id number]  Chotiner, Claudean Severance, MD ED   03/19/2016 1653 04/27/2016 1844 Full Code [id number]  Kristeen Mans, NP Inpatient   03/18/2016 2107 03/19/2016 1548 Full Code [id number]  Trixie Dredge, PA-C ED   03/01/2016 2136 03/02/2016 1652 Full Code [id number]  Jerre Simon, PA ED   02/15/2016 2039 02/16/2016 2035 Full Code [id number]  Tilden Fossa, MD ED   03/21/2015 1542 03/27/2015 1732 Full Code [id number]  [name], MD ED   02/04/2015 1007 02/07/2015 1723 Full Code [id number]  Elease Etienne, MD Inpatient         IV Access:   Peripheral IV   Procedures and diagnostic studies:   No results found.   Medical  Consultants:   None.   Subjective:    Ronald Roman no complaints  Objective:    Vitals:   12/14/22 0556 12/14/22 1309 12/14/22 1940 12/07/2022 0626  BP: (!) 145/112 (!) 131/99 (!) 121/101 (!) 139/93  Pulse: (!) 106 94 94 (!) 106  Resp: 18 18 18 18   Temp: (!) 97.4 F (36.3 C) (!) 97.4 F (36.3 C) 98.7 F (37.1 C) 98.7 F (37.1 C)  TempSrc: Oral Oral Oral Oral  SpO2:  94% 99% 99%  Weight:      Height:       SpO2: 99 %   Intake/Output  Summary (Last 24 hours) at 12/10/2022 1017 Last data filed at 11/22/2022 6295 Gross per 24 hour  Intake 2293.54 ml  Output 1500 ml  Net 793.54 ml   Filed Weights   12/12/22 0000  Weight: 52.5 kg    Exam: General exam: In no acute distress. Respiratory system: Good air movement and clear to auscultation. Cardiovascular system: S1 & S2 heard, RRR. Marland Kitchen  Gastrointestinal system: Abdomen is nondistended, soft and nontender.  Extremities: No pedal edema. Skin: No rashes, lesions or ulcers Psychiatry: Judgement and insight appear normal. Mood & affect appropriate.    Data Reviewed:    Labs: Basic Metabolic Panel: Recent Labs  Lab 12/11/22 0540 12/12/22 0543 12/13/22 0451 12/14/22 0553 12/02/2022 0544  NA 135 137 136 138 139  K 4.9 4.4 4.1 3.7 4.5  CL 104 107 105 111 109  CO2 21* 22 22 19* 21*  GLUCOSE 90 77 82 98 136*  BUN 50* 29* 19 19 32*  CREATININE 1.92* 1.28* 1.07 0.90 1.19  CALCIUM 9.2 9.2 9.1 9.1 9.3  MG  --  1.5* 1.8 1.5* 2.3   GFR Estimated Creatinine Clearance: 48.4 mL/min (by C-G formula based on SCr of 1.19 mg/dL). Liver Function Tests: Recent Labs  Lab 01/03/2023 2226 12/04/2022 0544  AST 25 26  ALT 14 13  ALKPHOS 85 76  BILITOT 0.7 0.5  PROT 8.0 6.6  ALBUMIN 4.0 3.2*   Recent Labs  Lab 03-Jan-2023 2226  LIPASE 27   Recent Labs  Lab 01/03/2023 2209 12/14/22 2247  AMMONIA 19 38*   Coagulation profile No results for input(s): "INR", "PROTIME" in the last 168 hours. COVID-19 Labs  No results for input(s): "DDIMER", "FERRITIN", "LDH", "CRP" in the last 72 hours.  Lab Results  Component Value Date   SARSCOV2NAA NEGATIVE 08/15/2022   SARSCOV2NAA NEGATIVE 05/29/2022   SARSCOV2NAA NEGATIVE 11/25/2021   SARSCOV2NAA NEGATIVE 11/13/2020    CBC: Recent Labs  Lab 01-03-2023 2226 12/11/22 0540 12/14/22 0553 12/09/2022 0639  WBC 9.2 9.9 9.5 10.1  NEUTROABS 6.8  --   --   --   HGB 10.1* 9.5* 9.1* 8.0*  HCT 31.0* 29.6* 28.7* 24.7*  MCV 81.6 82.9 85.2  83.2  PLT 178 170 163 160   Cardiac Enzymes: Recent Labs  Lab 12/14/22 0552  CKTOTAL 246   BNP (last 3 results) No results for input(s): "PROBNP" in the last 8760 hours. CBG: Recent Labs  Lab 01-03-2023 2236  GLUCAP 79   D-Dimer: No results for input(s): "DDIMER" in the last 72 hours. Hgb A1c: No results for input(s): "HGBA1C" in the last 72 hours. Lipid Profile: No results for input(s): "CHOL", "HDL", "LDLCALC", "TRIG", "CHOLHDL", "LDLDIRECT" in the last 72 hours. Thyroid function studies: No results for input(s): "TSH", "T4TOTAL", "T3FREE", "THYROIDAB" in the last 72 hours.  Invalid input(s): "FREET3" Anemia work up: No results for input(s): "VITAMINB12", "  FOLATE", "FERRITIN", "TIBC", "IRON", "RETICCTPCT" in the last 72 hours. Sepsis Labs: Recent Labs  Lab 12/06/2022 2226 12/11/22 0540 12/14/22 0553 2022-12-21 0639  WBC 9.2 9.9 9.5 10.1   Microbiology No results found for this or any previous visit (from the past 240 hour(s)).   Medications:    amantadine  100 mg Oral BID   cloZAPine  25 mg Oral BID   divalproex  500 mg Oral BID   feeding supplement  237 mL Oral BID BM   heparin  5,000 Units Subcutaneous Q8H   levETIRAcetam  500 mg Oral BID   multivitamin with minerals  1 tablet Oral Daily   polyethylene glycol  17 g Oral BID   senna-docusate  2 tablet Oral BID   Continuous Infusions:  sodium chloride 125 mL/hr at 21-Dec-2022 0533      LOS: 4 days   Marinda Elk  [facility]  12/21/2022, 10:17 AM

## 2022-12-23 NOTE — Progress Notes (Signed)
IV team responded to Code Blue. 1 established IV. Unsuccessful attempt x 2. MD placed IO.

## 2022-12-23 NOTE — Procedures (Signed)
Central Venous Catheter Insertion Procedure Note  OSEI ANGER  [id number]  07/25/1960  Date:01/09/2023  Time:6:16 PM   Provider Performing:Red[place] Cloe Chelsea Aus   Procedure: Insertion of Non-tunneled Central Venous [phone]) with US guidance ([id number])   Indication(s) Medication administration  Consent Risks of the procedure as well as the alternatives and risks of each were explained to the patient and/or caregiver.  Consent for the procedure was obtained and is signed in the bedside chart  Anesthesia Topical only with 1% lidocaine   Timeout Verified patient identification, verified procedure, site/side was marked, verified correct patient position, special equipment/implants available, medications/allergies/relevant history reviewed, required imaging and test results available.  Sterile Technique Maximal sterile technique including full sterile barrier drape, hand hygiene, sterile gown, sterile gloves, mask, hair covering, sterile ultrasound probe cover (if used).  Procedure Description Area of catheter insertion was cleaned with chlorhexidine and draped in sterile fashion.  With real-time ultrasound guidance a central venous catheter was placed into the right internal jugular vein. Nonpulsatile blood flow and easy flushing noted in all ports.  The catheter was sutured in place and sterile dressing applied.  Complications/Tolerance None; patient tolerated the procedure well. Chest X-ray is ordered to verify placement for internal jugular or subclavian cannulation.   Chest x-ray is not ordered for femoral cannulation.  EBL Minimal  Specimen(s) None

## 2022-12-23 NOTE — Progress Notes (Addendum)
Rapidly rising pressor requirements. Pplat now in 40s. While I was in the room he had a PEA cardiac arrest. CPR was started, epi 12 min. After 3 cycles of CPR. ROSC achieved. Epi, bicarb, calcium, dextrose given during the code. More feculent material coming out of his mouth. Shortly after ROSC developed severe bradycardia again, epi push prevented arrest. Maxed on epi, NE, vasopressin. LR bolus going.   Patient has no family and has a legal guardian. Discussed with Adi Paliwal-- [facility] CCM physician overnight. We reviewed the case. Since there is no family and he appears to have an intrabadominal catastrophe. He will not be stable for a CT or the OR any time soon.   I discussed with legal guardian [facility], who agrees with DNR.   Cathleen Fears [phone]-- boss of [employer]-- called 3x to discuss whether focusing on comfort at this point and limiting escalation of care would be appropriate. She did not answer the phone and I was unable to leave a VM.  Personal cell- [phone]. I spoke to her, and she wants to speak to her director before deciding on escalation of care/ potentially focusing on comfort.   Another director- Rodena Medin [phone].     Steffanie Dunn, DO December 22, 2022 7:21 PM [facility]  For contact information, see Amion. If no response to pager, please call PCCM consult pager. After hours, 7PM- 7AM, please call [facility].    [place] - called back Jacki Cones to let her know. She said her directed had decided that the medical team should use our judgment.   I let her know he had passed away.  Steffanie Dunn, DO December 22, 2022 7:33 PM  Pulmonary [facility] Care

## 2022-12-23 NOTE — Progress Notes (Signed)
Patient arrived to ICU room 1223 post code from the fifth floor. Patient aspirated, more than likely due to bowel obstruction and was placed on a ventilator. PT on ventilator upon arrival, all life saving measures taken. Patient was a ward of the state, which was verified by provider. Patient cardiac arrested for a second time, paper documentation completed. ROSC obtained and life saving measures continued. PT has no legal guardian, and no one willing to assume decisions. See provider note regarding physician assuming decision making for patient. See post mortem note for TOD.

## 2022-12-23 NOTE — Progress Notes (Signed)
Pt expired.  TOD 1932.  ETT removed per CCMD, Clark.

## 2022-12-23 NOTE — Consult Note (Addendum)
[facility] Face-to-Face Psychiatry Consult   Reason for Consult: Paranoid schizophrenia here with increased confusion due to a chaotic.  Prolonged confusion increasing dystonia. Referring Physician:   Patient Identification: Ronald Roman MRN:  [id number] Principal Diagnosis: Acute kidney injury [facility]) Diagnosis:  Principal Problem:   Acute kidney injury (HCC) Active Problems:   Diabetes mellitus without complication (HCC)   Hypertension   Paranoid schizophrenia (HCC)   AKI (acute kidney injury) (HCC)   Seizure disorder (HCC)   AMS (altered mental status)   Malnutrition of moderate degree   Total Time spent with patient: 30 minutes  Subjective:   Ronald Roman is a 62 y.o. male patient admitted with acute kidney injury.  Per chart review patient presents from [facility] for change in acute mental status.  Chart review further shows patient has increased and syncopal episodes, falls, confusion, and disorientation.  Patient usually has a Comptroller or caregiver, however none at the bedside.  He is also being manage by [facility] for seizures and ataxia.  Review of records show patient has history of paranoid schizophrenia, seizures.  On examination patient is awake and alert, he is minimally responsive with answers to most questions. He is observed to be grunting and very restless in bed. Per nursing he is constipated and has been grunting today, and they are working on a bowel prep regimen. In comparison to yesterday, he is no longer somnolent, somewhat interactive and responds to name being called. He is ungowned and observed to have a male purewick in place, provider and nursing staff attempted to regown patient he immediately wiggled and thrashed in the bed to have blanket and gown removed. He was unable to able any orientation questions, outside of responding to is name and grunting. Due to patients restlessness and increasing agitation, provider offered to resume medications that  were previously adjusted yesterday due to altered mental status and somnolence. Psychiatry consult service will continue to follow at this time, however will not make any additional adjustments as he is awake and alert however not at his baseline.    HPI:  Ronald Roman is a 62 y.o. male with medical history significant for paranoid schizophrenia, T2DM, HTN, seizure disorder who presented to the ED from [facility] assisted living for evaluation of change in mental status.   Patient brought to the ED from assisted living facility for acute change in mentation.  Patient was reportedly at his baseline day prior to admission.  Normally can hold a conversation, walk, feed himself.  However day of admission developed acute change with difficulty ambulating, incontinence, and speaking to himself.  Patient reported hearing voices.  Has not had any other complaints.  He is inattentive and tossing and turning in the bed.  Past Psychiatric History: Unable to obtain due to limited participation for patient.  Attempted to call nursing home x 2; unsuccessful.   Risk to Self:   Denies Risk to Others:   Denies Prior Inpatient Therapy:   UTA Prior Outpatient Therapy:   UTA  Past Medical History:  Past Medical History:  Diagnosis Date   Diabetes mellitus without complication (HCC)    GERD (gastroesophageal reflux disease)    Hyperlipidemia    Hypertension    Paranoid schizophrenia (HCC)    Seizures (HCC)    Sleep apnea    Uric acid nephrolithiasis     Past Surgical History:  Procedure Laterality Date   ESOPHAGOGASTRODUODENOSCOPY (EGD) WITH PROPOFOL N/A 08/21/2022   Procedure: ESOPHAGOGASTRODUODENOSCOPY (EGD) WITH PROPOFOL;  Surgeon: Beverley Fiedler, MD;  Location: [facility] ENDOSCOPY;  Service: Gastroenterology;  Laterality: N/A;   Family History: History reviewed. No pertinent family history. Family Psychiatric  History: UTA Social History:  Social History   Substance and Sexual Activity  Alcohol Use  No     Social History   Substance and Sexual Activity  Drug Use No    Social History   Socioeconomic History   Marital status: Single    Spouse name: Not on file   Number of children: Not on file   Years of education: Not on file   Highest education level: Not on file  Occupational History   Not on file  Tobacco Use   Smoking status: Former    Current packs/day: 1.00    Types: Cigarettes   Smokeless tobacco: Never  Vaping Use   Vaping status: Never Used  Substance and Sexual Activity   Alcohol use: No   Drug use: No   Sexual activity: Not Currently  Other Topics Concern   Not on file  Social History Narrative   Lives at [facility]   Right handed    Social Determinants of Health   Financial Resource Strain: Not on file  Food Insecurity: Patient Declined (11/02/2022)   Hunger Vital Sign    Worried About Running Out of Food in the Last Year: Patient declined    Ran Out of Food in the Last Year: Patient declined  Transportation Needs: No Transportation Needs (11/02/2022)   PRAPARE - Administrator, Civil Service (Medical): No    Lack of Transportation (Non-Medical): No  Physical Activity: Not on file  Stress: Not on file  Social Connections: Not on file   Additional Social History:    Allergies:   Allergies  Allergen Reactions   Cogentin [Benztropine] Other (See Comments)    Not documented on the [facility]   Penicillins Other (See Comments)    Not documented on the [facility]    Prolixin [Fluphenazine] Other (See Comments)    Not documented on the [facility]     Labs:  Results for orders placed or performed during the hospital encounter of 2022/12/25 (from the past 48 hour(s))  CK     Status: None   Collection Time: 12/14/22  5:52 AM  Result Value Ref Range   Total CK 246 49 - 397 U/L    Comment: Performed at [facility], [address], [place], [name] [address]  Basic metabolic panel     Status: Abnormal   Collection  Time: 12/14/22  5:53 AM  Result Value Ref Range   Sodium 138 135 - 145 mmol/L   Potassium 3.7 3.5 - 5.1 mmol/L   Chloride 111 98 - 111 mmol/L   CO2 19 (L) 22 - 32 mmol/L   Glucose, Bld 98 70 - 99 mg/dL    Comment: Glucose reference range applies only to samples taken after fasting for at least 8 hours.   BUN 19 8 - 23 mg/dL   Creatinine, Ser 1.32 0.61 - 1.24 mg/dL   Calcium 9.1 8.9 - 44.0 mg/dL   GFR, Estimated >10 >27 mL/min    Comment: (NOTE) Calculated using the CKD-EPI Creatinine Equation (2021)    Anion gap 8 5 - 15    Comment: Performed at [facility], [address] [place], [name] [address]  CBC     Status: Abnormal   Collection Time: 12/14/22  5:53 AM  Result Value Ref Range  WBC 9.5 4.0 - 10.5 K/uL   RBC 3.37 (L) 4.22 - 5.81 MIL/uL   Hemoglobin 9.1 (L) 13.0 - 17.0 g/dL   HCT 57.8 (L) 46.9 - 62.9 %   MCV 85.2 80.0 - 100.0 fL   MCH 27.0 26.0 - 34.0 pg   MCHC 31.7 30.0 - 36.0 g/dL   RDW 52.8 41.3 - 24.4 %   Platelets 163 150 - 400 K/uL   nRBC 0.0 0.0 - 0.2 %    Comment: Performed at [facility], [address] [place], [name] [address]  Magnesium     Status: Abnormal   Collection Time: 12/14/22  5:53 AM  Result Value Ref Range   Magnesium 1.5 (L) 1.7 - 2.4 mg/dL    Comment: Performed at [facility], [address] [place], [name] [address]  Valproic acid level     Status: None   Collection Time: 12/14/22 10:47 PM  Result Value Ref Range   Valproic Acid Lvl 60 50.0 - 100.0 ug/mL    Comment: Performed at [facility], [address], [place], [name] [address]  Ammonia     Status: Abnormal   Collection Time: 12/14/22 10:47 PM  Result Value Ref Range   Ammonia 38 (H) 9 - 35 umol/L    Comment: Performed at [facility], [address], [place], [name] [address]  Comprehensive metabolic panel     Status: Abnormal   Collection Time: January 07, 2023  5:44 AM  Result Value  Ref Range   Sodium 139 135 - 145 mmol/L   Potassium 4.5 3.5 - 5.1 mmol/L   Chloride 109 98 - 111 mmol/L   CO2 21 (L) 22 - 32 mmol/L   Glucose, Bld 136 (H) 70 - 99 mg/dL    Comment: Glucose reference range applies only to samples taken after fasting for at least 8 hours.   BUN 32 (H) 8 - 23 mg/dL   Creatinine, Ser 5.63 0.61 - 1.24 mg/dL   Calcium 9.3 8.9 - 87.5 mg/dL   Total Protein 6.6 6.5 - 8.1 g/dL   Albumin 3.2 (L) 3.5 - 5.0 g/dL   AST 26 15 - 41 U/L   ALT 13 0 - 44 U/L   Alkaline Phosphatase 76 38 - 126 U/L   Total Bilirubin 0.5 0.3 - 1.2 mg/dL   GFR, Estimated >64 >33 mL/min    Comment: (NOTE) Calculated using the CKD-EPI Creatinine Equation (2021)    Anion gap 9 5 - 15    Comment: Performed at [facility], [address], [place], [name] [address]  Magnesium     Status: None   Collection Time: Jan 07, 2023  5:44 AM  Result Value Ref Range   Magnesium 2.3 1.7 - 2.4 mg/dL    Comment: Performed at [facility], [address], [place], [name] [address]  CBC     Status: Abnormal   Collection Time: 2023/01/07  6:39 AM  Result Value Ref Range   WBC 10.1 4.0 - 10.5 K/uL   RBC 2.97 (L) 4.22 - 5.81 MIL/uL   Hemoglobin 8.0 (L) 13.0 - 17.0 g/dL   HCT 06.3 (L) 01.6 - 01.0 %   MCV 83.2 80.0 - 100.0 fL   MCH 26.9 26.0 - 34.0 pg   MCHC 32.4 30.0 - 36.0 g/dL   RDW 93.2 35.5 - 73.2 %   Platelets 160 150 - 400 K/uL   nRBC 0.0 0.0 - 0.2 %  Comment: Performed at [facility], [address] [place], [name] [address]  HIV Antibody (routine testing w rflx)     Status: None   Collection Time: 12/11/2022 11:09 AM  Result Value Ref Range   HIV Screen 4th Generation wRfx Non Reactive Non Reactive    Comment: Performed at [facility], [address], [place], [name] [address]  Blood gas, arterial     Status: Abnormal   Collection Time: 12/20/2022  4:10 PM  Result Value Ref Range   FIO2 100 %   Delivery systems VENTILATOR     MECHVT 510 mL   RATE 18 resp/min   PEEP 5 cm H20   pH, Arterial 7.13 (LL) 7.35 - 7.45    Comment: CRITICAL RESULT CALLED TO, READ BACK BY AND VERIFIED WITH: Rico Junker RN @ 1629 ON 11/24/2022 BY ABDULHALIM,M    pCO2 arterial 59 (H) 32 - 48 mmHg   pO2, Arterial 334 (H) 83 - 108 mmHg   Bicarbonate 19.6 (L) 20.0 - 28.0 mmol/L   Acid-base deficit 9.0 (H) 0.0 - 2.0 mmol/L   O2 Saturation 98.3 %   Patient temperature 37.0    Collection site LEFT BRACHIAL    Drawn by [id number]    Allens test (pass/fail) PASS PASS    Comment: Performed at [facility], [address], [place], [name] [address]  Glucose, capillary     Status: Abnormal   Collection Time: 12/02/2022  4:24 PM  Result Value Ref Range   Glucose-Capillary 219 (H) 70 - 99 mg/dL    Comment: Glucose reference range applies only to samples taken after fasting for at least 8 hours.   Comment 1 Notify RN     Current Facility-Administered Medications  Medication Dose Route Frequency Provider Last Rate Last Admin   0.9 %  sodium chloride infusion  250 mL Intravenous Continuous Simonne Martinet, NP       acetaminophen (TYLENOL) tablet 650 mg  650 mg Oral Q6H PRN Charlsie Quest, MD       Or   acetaminophen (TYLENOL) suppository 650 mg  650 mg Rectal Q6H PRN Charlsie Quest, MD       ceFEPIme (MAXIPIME) 2 g in sodium chloride 0.9 % 100 mL IVPB  2 g Intravenous NOW Cindi Carbon, [facility]       [START ON 12/16/2022] ceFEPIme (MAXIPIME) 2 g in sodium chloride 0.9 % 100 mL IVPB  2 g Intravenous Q12H Cindi Carbon, RPH       Chlorhexidine Gluconate Cloth 2 % PADS 6 each  6 each Topical Daily Karie Fetch P, DO       famotidine (PEPCID) IVPB 20 mg premix  20 mg Intravenous Q12H Janeece Riggers, NP       heparin injection 5,000 Units  5,000 Units Subcutaneous Q8H Darreld Mclean R, MD   5,000 Units at 11/30/2022 1411   [START ON 12/16/2022] insulin aspart (novoLOG) injection 0-9 Units  0-9 Units Subcutaneous TID WC Janeece Riggers, NP        lactated ringers bolus 2,000 mL  2,000 mL Intravenous Once Simonne Martinet, NP       lactated ringers infusion   Intravenous Continuous Simonne Martinet, NP       levETIRAcetam (KEPPRA) IVPB 500 mg/100 mL premix  500 mg Intravenous Q12H Simonne Martinet, NP       metroNIDAZOLE (FLAGYL) IVPB 500 mg  500 mg Intravenous Q12H Simonne Martinet, NP  norepinephrine (LEVOPHED) 4mg  in (0.016 mg/mL) premix infusion  2-10 mcg/min Intravenous Titrated Simonne Martinet, NP 30 mL/hr at December 27, 2022 1620 8 mcg/min at Dec 27, 2022 1620   Oral care mouth rinse  15 mL Mouth Rinse Q2H Steffanie Dunn, DO       Oral care mouth rinse  15 mL Mouth Rinse PRN Karie Fetch P, DO       propofol (DIPRIVAN) 1000 MG/100ML infusion  0-50 mcg/kg/min Intravenous Continuous Janeece Riggers, NP       valproate (DEPACON) 250 mg in dextrose 5 % 50 mL IVPB  250 mg Intravenous Q6H Cindi Carbon, RPH       vancomycin (VANCOREADY) IVPB 1250 mg/250 mL  1,250 mg Intravenous STAT Cindi Carbon, [place]       [START ON 12/16/2022] vancomycin (VANCOREADY) IVPB 750 mg/150 mL  750 mg Intravenous Q24H Cindi Carbon, [facility]        Musculoskeletal: Strength & Muscle Tone: within normal limits Gait & Station: normal Patient leans: N/A            Psychiatric Specialty Exam:  Presentation  General Appearance:  Disheveled  Eye Contact: Minimal  Speech: Clear and Coherent  Speech Volume: Normal  Handedness: Right   Mood and Affect  Mood: -- (UTA)  Affect: -- (UTA)   Thought Process  Thought Processes: Coherent; Linear  Descriptions of Associations:-- (UTA)  Orientation:Other (comment) (a& o x 1)  Thought Content:Illogical  History of Schizophrenia/Schizoaffective disorder:No data recorded Duration of Psychotic Symptoms:No data recorded Hallucinations:Hallucinations: -- (UTA)  Ideas of Reference:-- (UTA)  Suicidal Thoughts:Suicidal Thoughts: -- (UTA)  Homicidal Thoughts:Homicidal Thoughts: --  (UTA)   Sensorium  Memory: Immediate Poor (Limited partcipation)  Judgment: Other (comment) (Limited partcipation)  Insight: Other (comment) (Limited partcipation)   Executive Functions  Concentration: Poor  Attention Span: Poor  Recall: Poor  Fund of Knowledge: Poor  Language: Poor   Psychomotor Activity  Psychomotor Activity:Psychomotor Activity: Restlessness; Increased   Assets  Assets: Housing; Social Support; Resilience   Sleep  Sleep: Sleep: -- (UTA)   Physical Exam: Physical Exam Vitals and nursing note reviewed.  Constitutional:      General: He is awake. He is in acute distress.     Appearance: He is underweight. He is ill-appearing.     Comments: Soft mitten restraints bilaterally  Eyes:     General: Lids are normal.        Right eye: No discharge (improved).  Pulmonary:     Effort: Tachypnea present.     Comments: grunting Abdominal:     General: There is distension (grossly).  Neurological:     General: No focal deficit present.     Mental Status: He is alert.     Sensory: Sensation is intact.     Comments: Limited partcipation, will not open eyes or engage. Minimal verbal responses  Psychiatric:        Attention and Perception: Attention and perception normal.        Speech: Speech normal.        Behavior: Behavior is cooperative.        Thought Content: Thought content normal.    Review of Systems  Unable to perform ROS: Patient unresponsive   Blood pressure 122/70, pulse 98, temperature 98.7 F (37.1 C), temperature source Oral, resp. rate 16, height 5\' 6"  (1.676 m), weight 52.5 kg, SpO2 100%. Body mass index is 18.67 kg/m.   Patient with increased restlessness, agitation, grunting and appears ill. Discussed  with nursing offer OTO for prn medication to reduce agitation. However provider is very concerned with patients clinical presentation and did not want to mask his clinical presentation with sedating medications. She is  encouraged to reach out to [facility] immediately as patient needs medical assistance due to his current presentation.   Treatment Plan Summary: Daily contact with patient to assess and evaluate symptoms and progress in treatment, Medication management, and Plan    -Will discontinue Trazodone and Seroquel.   Consider switching patient to Ingrezza if managing tardive dyskinesia or concerned about EPS symptoms. -Will continue current dose of Clozaril 25 mg p.o. twice daily,. -Continue close observation, there appear to be no acute safety concerns. -Consider EEG if he continues to have altered mental status.  Labs ordered Ammonia (38), Depakote (60), and CK (200+). Will order HIV, RPR,and Keppra level. WIll repeat Ammonia on Friday, and continue current dose of Depakote.  -Will obtain EKG, prolonged QTc 523.    Psychiatry will continue to follow.   Disposition: No evidence of imminent risk to self or others at present.   Patient does not meet criteria for psychiatric inpatient admission. Supportive therapy provided about ongoing stressors. Discussed crisis plan, support from social network, calling 911, coming to the Emergency Department, and calling Suicide Hotline.  Maryagnes Amos, FNP January 09, 2023 5:01 PM

## 2022-12-23 NOTE — H&P (Signed)
NAME:  Ronald Roman, MRN:  [id number], DOB:  1960/08/04, LOS: 4 ADMISSION DATE:  12/14/2022, CONSULTATION DATE:  01/12/2023  REFERRING MD:  Dr Carmell Austria , CHIEF COMPLAINT:  AMS   History of Present Illness:  Ronald Roman is a 62 year old male with past medical history notable for paranoid schizophrenia, T2DM, hypertension, seizure disorder who presented to emergency department 12/05/2022 from [facility] for evaluation of change in mentation.  Notably at patient's baseline he is able to hold a conversation, walk, feed himself however they have admission developed acute change in mentation with difficulty ambulating, incontinence and speaking to himself.  Patient reported hearing voices however had no other subjective complaints.  Patient was inattentive and tossing and turning in the bed and was brought to emergency department for further workup and evaluation.  In the emergency department workup showed serum ethanol, acetaminophen, salicylate levels undetectable.  UDS negative.  UA negative for urinary tract infection.  CT head without contrast negative for acute intracranial process.  Atrophy with chronic microvascular ischemia changes noted.  Chest x-ray negative for focal consolidation, edema, effusion.  Patient was given 1 L LR in the emergency department and consulted TR[facility] for admission.  Patient was admitted to [facility] that time for AMS with unclear etiology and a known acute kidney injury.  On 2023/01/12 ongoing workup was underway- was seen by psychiatry for ongoing metabolic encephalopathy. Psychiatry recommended discontinuation of cogentin and transition to Ingrez[employer], with reduced chlorazanil. Patient was actively being treated for constipation taking miralax and senokot oral stool softeners when suddenly had large emesis event. Nursing staff reported subsequent rapid neurological decline followed by PEA arrest and had 3 rounds of epinephrine 1 amp bicarb and calcium  gluconate with 12 minutes chest compressions and had ROSC-PCCM was consulted for transfer to ICU at that time  Pertinent  Medical History  Paranoid schizophrenia, T2DM, hypertension, seizure disorder  Significant Hospital Events: Including procedures, antibiotic start and stop dates in addition to other pertinent events   7/19 admitted to [facility] with AMS and AKI 2023-01-12 PEA arrest s/p aspiration event. Gained ROSC and transferred to ICU intubated and on levophed gtt.  Interim History / Subjective:    Objective   Blood pressure (!) 139/100, pulse 98, temperature 98.7 F (37.1 C), temperature source Oral, resp. rate 16, height 5\' 6"  (1.676 m), weight 52.5 kg, SpO2 99%.    Vent Mode: PRVC FiO2 (%):  [100 %] 100 % Set Rate:  [18 bmp] 18 bmp Vt Set:  [510 mL] 510 mL PEEP:  [5 cmH20] 5 cmH20 Plateau Pressure:  [21 cmH20] 21 cmH20   Intake/Output Summary (Last 24 hours) at January 12, 2023 1607 Last data filed at 01-12-23 1100 Gross per 24 hour  Intake 2143.54 ml  Output 1500 ml  Net 643.54 ml   Filed Weights   12/12/22 0000  Weight: 52.5 kg    Examination: Physical Exam HENT:     Mouth/Throat:     Comments: OG tube and ETT tube Eyes:     Pupils: Pupils are equal, round, and reactive to light.     Comments: Cataracts BL   Cardiovascular:     Pulses: Normal pulses.     Heart sounds: Normal heart sounds.  Pulmonary:     Comments: Diminished bases BL Abdominal:     General: There is distension.  Musculoskeletal:     Right lower leg: No edema.     Left lower leg: No edema.  Skin:    General:  Skin is warm.     Capillary Refill: Capillary refill takes less than 2 seconds.  Neurological:     Comments: Not following commands or withdrawing to noxious stimuli       Resolved Hospital Problem list   PEA arrest 7/24   Assessment & Plan:   Acute hypoxic respiratory failure secondary to aspiration pneumonia - IV abx coverage with Cefepime, Flagyl, vancomycin for Aspiration PNA -  Resp culture pending - Procal and lactic acid ordered and pending - Propofol and Fentanyl infusions per PAD protocol with RASS goal -1 -Maintain full vent support with SAT/SBT as tolerated -titrate Vent setting to maintain SpO2 greater than or equal to 90%. -HOB elevated 30 degrees. -Plateau pressures less than 30 cm H20.  -Follow chest x-ray, ABG prn.   -Bronchial hygiene and RT/bronchodilator protocol.   Septic shock/sirs response secondary to Aspiration PNA Patient received 3 rounds of epinephrine 1 amp bicarb and calcium gluconate.  Anesthesia intubated for airway protection and large amount of stool content was suctioned from ETT.  Subsequently patient had ROS return after 12 minutes of chest compressions and rounds of epi. -Hold home antihypertensive medications -IVF resuscitation with LR 44ml/kg  -Levo gtt for MAP goal >65 or SBP >90   Acute Metabolic encephalopathy status post cardiac arrest Baseline paranoid schizophrenia Already hypothermic post arrest- currently on bair hugger warming blanket therapy.  -Pre code psychiatry was consulted and recommended discontinuation of Cogentin with transition to [employer], with reduction of chlorazanil. -After discussion with psychiatry post code- Holding home psychiatric medications: hydroxyzine, clozapine, amantadine -Ammonia 38, continue to monitor -CT head pending -EEG pending -Follow HIV, RPR, keppra level -Maintain euvolemia, euglycemia, eunatremia, euthermic, and MAP goal 65 or greater to ensure perfusion -Correct electrolyte and metabolic disturbances    Ileus CT abdomen pelvis without contrast performed on 01-05-2023 showed diffuse dilation of the colon and small bowel loops without definite transition point.  May be related to ileus.  Very large amount of stool throughout the colon.  Patient was being administered oral stool softeners with MiraLAX and Senokot as well as smog enema. -CT abd pelvis STAT pending -OG tube to low  intermittent suction to decompress abdomen Can consider restarting stool softeners and bowel regimen when appropriate    Acute kidney injury Likely prerenal as patient notably had poor oral intake while on hospitalist service.  Baseline serum creatinine appears to be around 0.8-0.9.  On admission serum creatinine was 2.07--latest lab value this morning 1.19 -IVF as above -Repeat BMP ordered and pending, if continues to worsen despite IVF resuscitation- please order renal US -Avoid nephrotoxic agents -Renally dose medications -Continue to monitor renal function with a.m. labs   DMT2 Holding oral hypoglycemic agents-resume as outpatient if medically appropriate to do so at discharge.  A1c of 6.1 -Low dose insulin sliding scale -Hypoglycemia protocol    Seizure disorder -Continue AEDs with Depakote and Keppra  Best Practice (right click and "Reselect all SmartList Selections" daily)   Diet/type: NPO DVT prophylaxis: prophylactic heparin  GI prophylaxis: H2B Lines: N/A Foley:  Yes, and it is still needed Code Status:  full code Last date of multidisciplinary goals of care discussion [12/05/2022 ]  Labs   CBC: Recent Labs  Lab 05-Jan-2023 2226 12/11/22 0540 12/14/22 0553 11/24/2022 0639  WBC 9.2 9.9 9.5 10.1  NEUTROABS 6.8  --   --   --   HGB 10.1* 9.5* 9.1* 8.0*  HCT 31.0* 29.6* 28.7* 24.7*  MCV 81.6 82.9 85.2 83.2  PLT  178 170 163 160    Basic Metabolic Panel: Recent Labs  Lab 12/11/22 0540 12/12/22 0543 12/13/22 0451 12/14/22 0553 11/29/2022 0544  NA 135 137 136 138 139  K 4.9 4.4 4.1 3.7 4.5  CL 104 107 105 111 109  CO2 21* 22 22 19* 21*  GLUCOSE 90 77 82 98 136*  BUN 50* 29* 19 19 32*  CREATININE 1.92* 1.28* 1.07 0.90 1.19  CALCIUM 9.2 9.2 9.1 9.1 9.3  MG  --  1.5* 1.8 1.5* 2.3   GFR: Estimated Creatinine Clearance: 48.4 mL/min (by C-G formula based on SCr of 1.19 mg/dL). Recent Labs  Lab Jan 03, 2023 2226 12/11/22 0540 12/14/22 0553 11/25/2022 0639  WBC  9.2 9.9 9.5 10.1    Liver Function Tests: Recent Labs  Lab Jan 03, 2023 2226 12/04/2022 0544  AST 25 26  ALT 14 13  ALKPHOS 85 76  BILITOT 0.7 0.5  PROT 8.0 6.6  ALBUMIN 4.0 3.2*   Recent Labs  Lab 01/03/2023 2226  LIPASE 27   Recent Labs  Lab 01-03-23 2209 12/14/22 2247  AMMONIA 19 38*    ABG    Component Value Date/Time   PHART 7.325 (L) 05/28/2015 1427   PCO2ART 41.2 05/28/2015 1427   PO2ART 44.4 (L) 05/28/2015 1427   HCO3 25.1 07/08/2022 1636   TCO2 16 (L) 08/15/2022 0938   ACIDBASEDEF 2.7 (H) 10/11/2020 2304   O2SAT 59 07/08/2022 1636     Coagulation Profile: No results for input(s): "INR", "PROTIME" in the last 168 hours.  Cardiac Enzymes: Recent Labs  Lab 12/14/22 0552  CKTOTAL 246    HbA1C: Hgb A1c MFr Bld  Date/Time Value Ref Range Status  12/11/2022 05:40 AM 6.1 (H) 4.8 - 5.6 % Final    Comment:    (NOTE) Pre diabetes:          5.7%-6.4%  Diabetes:              >6.4%  Glycemic control for   <7.0% adults with diabetes   11/13/2020 09:38 PM 5.3 4.8 - 5.6 % Final    Comment:    (NOTE) Pre diabetes:          5.7%-6.4%  Diabetes:              >6.4%  Glycemic control for   <7.0% adults with diabetes     CBG: Recent Labs  Lab 2023/01/03 2236  GLUCAP 79    Review of Systems:   Negative except as listed in HPI and POC.  Past Medical History:  He,  has a past medical history of Diabetes mellitus without complication (HCC), GERD (gastroesophageal reflux disease), Hyperlipidemia, Hypertension, Paranoid schizophrenia (HCC), Seizures (HCC), Sleep apnea, and Uric acid nephrolithiasis.   Surgical History:   Past Surgical History:  Procedure Laterality Date   ESOPHAGOGASTRODUODENOSCOPY (EGD) WITH PROPOFOL N/A 08/21/2022   Procedure: ESOPHAGOGASTRODUODENOSCOPY (EGD) WITH PROPOFOL;  Surgeon: Beverley Fiedler, MD;  Location: [facility] ENDOSCOPY;  Service: Gastroenterology;  Laterality: N/A;     Social History:   reports that he has quit smoking. His  smoking use included cigarettes. He has never used smokeless tobacco. He reports that he does not drink alcohol and does not use drugs.   Family History:  His family history is not on file.   Allergies Allergies  Allergen Reactions   Cogentin [Benztropine] Other (See Comments)    Not documented on the [facility]   Penicillins Other (See Comments)    Not documented on the [facility]  Prolixin [Fluphenazine] Other (See Comments)    Not documented on the [facility]      Home Medications  Prior to Admission medications   Medication Sig Start Date End Date Taking? Authorizing Provider  amantadine (SYMMETREL) 100 MG capsule Take 100 mg by mouth 2 (two) times daily.   Yes [provider]  benztropine (COGENTIN) 1 MG tablet Take 1 mg by mouth 2 (two) times daily.   Yes [provider]  cetirizine (ZYRTEC) 10 MG tablet Take 10 mg by mouth daily.   Yes [provider]  cloZAPine (CLOZARIL) 25 MG tablet Take 1 tablet (25 mg total) by mouth 2 (two) times daily for 3 days, THEN 2 tablets (50 mg total) 2 (two) times daily. Needs prompt follow up with outpatient psychiatry and intermittent orthostatic checks. Patient taking differently: Take 2 tablets by mouth twice a day 11/03/22 12/28/2022 Yes Zigmund Daniel., MD  dapagliflozin propanediol (FARXIGA) 10 MG TABS tablet Take 10 mg by mouth daily after breakfast.   Yes [provider]  divalproex (DEPAKOTE ER) 500 MG 24 hr tablet Take 500 mg by mouth 2 (two) times daily.   Yes [provider]  ergocalciferol (VITAMIN D2) 1.25 MG (50000 UT) capsule Take 50,000 Units by mouth once a week. Thursday   Yes [provider]  levETIRAcetam (KEPPRA) 500 MG tablet Take 500 mg by mouth 2 (two) times daily.   Yes [provider]  magnesium oxide (MAG-OX) 400 (240 Mg) MG tablet Take 1 tablet (400 mg total) by mouth daily. 10/14/20  Yes Pokhrel, Laxman, MD  metFORMIN (GLUCOPHAGE) 1000 MG tablet Take 1,000 mg by mouth 2  (two) times daily with a meal.   Yes [provider]  mirtazapine (REMERON) 15 MG tablet Take 15 mg by mouth at bedtime.   Yes [provider]  polyethylene glycol (MIRALAX / GLYCOLAX) 17 g packet Take 17 g by mouth 2 (two) times daily.   Yes [provider]  QUEtiapine (SEROQUEL) 200 MG tablet Take 1 tablet (200 mg total) by mouth at bedtime. 08/23/22  Yes Hollice Espy, MD  QUEtiapine (SEROQUEL) 50 MG tablet Take 1 tablet (50 mg total) by mouth See admin instructions. Take 50 mg (1 tablet) by mouth every morning and at 3 pm. 08/23/22  Yes Hollice Espy, MD  traZODone (DESYREL) 100 MG tablet Take 1 tablet (100 mg total) by mouth at bedtime. 08/23/22  Yes Hollice Espy, MD  hydrOXYzine (ATARAX) 25 MG tablet Take 25 mg by mouth 2 (two) times daily as needed for anxiety.    [provider]  zolpidem (AMBIEN) 5 MG tablet Take 1 tablet (5 mg total) by mouth at bedtime as needed for sleep. 08/23/22   Hollice Espy, MD     Critical care time: 45 minutes   Janeece Riggers, AGACNP-BC [facility] For pager details, please see AMION or use EPIC chat After 1900, please call [facility] for cross coverage needs 12/12/2022 4:07 PM

## 2022-12-23 NOTE — Plan of Care (Signed)

## 2022-12-23 NOTE — Progress Notes (Signed)
Notified Lab that ABG being sent for analysis. 

## 2022-12-23 NOTE — Progress Notes (Signed)
Pharmacy Brief Note: Conversion of antiepileptics from PO to IV  -Keppra 500 mg IV q12h -Valproate 250 mg IV q6h  Cindi Carbon, PharmD 2023-01-03 4:58 PM

## 2022-12-23 NOTE — Plan of Care (Signed)
PATIENT IS DECEASED 

## 2022-12-23 NOTE — Progress Notes (Signed)
Code blue called during handoff report. MD Chestine Spore at bedside running code.  Current infusions running on handoff:  Epi drip at 71mcg/min Levophed drip at 56mcg/min  Vaso drip at 0.55mcg/min   Provider verbal order for titration of drips over guardrail parameters.   All drips infusing into right internal jugular central line  Assuming care of patient at this time

## 2022-12-23 DEATH — deceased

## 2023-01-23 NOTE — Death Summary Note (Signed)
DEATH SUMMARY   Patient Details  Name: Ronald Roman MRN: [id number] DOB: Feb 18, 1961  Admission/Discharge Information   Admit Date:  12-14-22  Date of Death: Date of Death: 12/19/2022  Time of Death: Time of Death: 01/06/30  Length of Stay: 4  Referring Physician: [facility]   Reason(s) for Hospitalization  Acute metabolic encephalopathy  Diagnoses  Preliminary cause of death:  Secondary Diagnoses (including complications and co-morbidities):  Principal Problem:   Acute kidney injury (HCC) Active Problems:   Diabetes mellitus without complication (HCC)   Hypertension   Paranoid schizophrenia (HCC)   AKI (acute kidney injury) (HCC)   Seizure disorder (HCC)   AMS (altered mental status)   Malnutrition of moderate degree   On mechanically assisted ventilation (HCC)   ARDS (adult respiratory distress syndrome) (HCC) Aspiration pneumonia- E coli Lactic acidosis.  Anemia, chronic constipation PEA cardiac arrest due  Shock Schizophrenia Hyperglycemia   Brief Hospital Course (including significant findings, care, treatment, and services provided and events leading to death)  Ronald Roman is a 62 y.o. year old male with past medical history notable for paranoid schizophrenia, T2DM, hypertension, seizure disorder who presented to emergency department 14-Dec-2022 [facility] for evaluation of change in mentation.  Notably at patient's baseline he is able to hold a conversation, walk, feed himself however they have admission developed acute change in mentation with difficulty ambulating, incontinence and speaking to himself.  Patient reported hearing voices however had no other subjective complaints.  Patient was inattentive and tossing and turning in the bed and was brought to emergency department for further workup and evaluation.  In the emergency department workup showed serum ethanol, acetaminophen, salicylate levels undetectable.   UDS negative.  UA negative for urinary tract infection.  CT head without contrast negative for acute intracranial process.  Atrophy with chronic microvascular ischemia changes noted.  Chest x-ray negative for focal consolidation, edema, effusion.  Patient was given 1 L LR in the emergency department and consulted [facility] for admission.  Patient was admitted to [facility] that time for AMS with unclear etiology and a known acute kidney injury.   On 12/19/22 ongoing workup was underway- was seen by psychiatry for ongoing metabolic encephalopathy. Psychiatry recommended discontinuation of cogentin and transition to Ingrezza, with reduced chlorazanil. Patient was actively being treated for constipation taking miralax and senokot oral stool softeners when suddenly had large emesis event. Nursing staff reported subsequent rapid neurological decline followed by PEA arrest and had 3 rounds of epinephrine 1 amp bicarb and calcium gluconate with 12 minutes chest compressions and had ROSC.  PCCM was consulted for transfer to ICU at that time. He unfortunately declined suddenly after moving to the ICU with rapidly rising vasopressor requirements and difficulty ventilating due to high intraabdominal and vent pressures. He suffered another PEA arrest and was resuscitated. He was on multiple vasopressors at high doses and bicarbonate infusion. Following the second arrest, code status was changed due to likely imminent death. He passed away shortly after. Unfortunately he was not stable in the ICU to be able to get an abdominal CT scan, but his physical exam was consistent with an intraabdominal catastrophe. CXR only showed intraluminal air, no free air.    Pertinent Labs and Studies  Significant Diagnostic Studies DG Abd Portable 1V  Result Date: December 19, 2022 CLINICAL DATA:  Orogastric placement EXAM: PORTABLE ABDOMEN - 1 VIEW COMPARISON:  None Available. FINDINGS: Orogastric tube tip in the stomach, probably the fundus. Moderate  amount  of intestinal gas suggesting ileus. IMPRESSION: Orogastric tube tip in the stomach, probably the fundus. Electronically Signed   By: Paulina Fusi M.D.   On: 12/21/2022 18:28   DG Chest Port 1 View  Result Date: 12/02/2022 CLINICAL DATA:  Acute respiratory failure EXAM: PORTABLE CHEST 1 VIEW COMPARISON:  December 21, 2022 FINDINGS: Endotracheal tube tip 4 cm above the carina. Orogastric or nasogastric tube enters the stomach. There is pulmonary venous hypertension with interstitial and early alveolar edema. No evidence of pleural effusion. IMPRESSION: Endotracheal tube tip 4 cm above the carina. Pulmonary venous hypertension with interstitial and early alveolar edema. Electronically Signed   By: Paulina Fusi M.D.   On: 12/11/2022 18:27   DG CHEST PORT 1 VIEW  Result Date: 12/13/2022 CLINICAL DATA:  Central line placement.  Orogastric tube placement. EXAM: PORTABLE CHEST 1 VIEW COMPARISON:  Earlier same day FINDINGS: Endotracheal tube tip 5 cm above the carina. Orogastric tube tip in the stomach. Right internal jugular central line tip in the SVC just above the right atrium. Worsening bilateral alveolar density most consistent with acute pulmonary edema. Massive aspiration could have a similar appearance. IMPRESSION: 1. Endotracheal tube tip 5 cm above the carina. Orogastric tube tip in the stomach. Right internal jugular central line tip in the SVC just above the right atrium. 2. Worsening bilateral alveolar density most consistent with acute pulmonary edema. Electronically Signed   By: Paulina Fusi M.D.   On: 12/09/2022 18:26   CT ABDOMEN PELVIS WO CONTRAST  Result Date: 12/11/2022 CLINICAL DATA:  Left lower quadrant pain EXAM: CT ABDOMEN AND PELVIS WITHOUT CONTRAST TECHNIQUE: Multidetector CT imaging of the abdomen and pelvis was performed following the standard protocol without IV contrast. RADIATION DOSE REDUCTION: This exam was performed according to the departmental dose-optimization program which  includes automated exposure control, adjustment of the mA and/or kV according to patient size and/or use of iterative reconstruction technique. COMPARISON:  CT abdomen and pelvis 10/31/2022 FINDINGS: Lower chest: No acute abnormality. Hepatobiliary: There is a calcified granuloma in the liver. No obvious liver lesion given lack of intravenous contrast and motion artifact. Gallbladder is grossly within normal limits. Pancreas: Grossly within normal limits given motion artifact. Spleen: Normal in size without focal abnormality. Adrenals/Urinary Tract: There is a 13 mm right renal cyst. Additional subcentimeter cortical hypodensity in the left kidney is too small to characterize, but favored as cysts. No urinary tract calculi or hydronephrosis. Adrenal glands are within normal limits. The bladder is distended, but otherwise within normal limits. Stomach/Bowel: The colon is diffusely dilated without definitive transition point. This is more prominent proximally. There is a very large amount of stool throughout the ascending colon, transverse colon and descending colon. The sigmoid colon is air-filled and dilated measuring up to 8.7 cm. No twisting identified. Additionally small-bowel loops are diffusely dilated measuring up to 4 cm without definite transition point. Air-fluid levels are seen throughout small bowel. The stomach is distended with large air-fluid level. The appendix is not seen. No focal inflammation or free air identified. Vascular/Lymphatic: No significant vascular findings are present. No enlarged abdominal or pelvic lymph nodes. Reproductive: Prostate gland is enlarged. Other: There is soft tissue density in the high right inguinal region which may be related to high riding testicle. Other etiologies are not excluded. No ascites. Musculoskeletal: Degenerative changes affect the spine. IMPRESSION: 1. Diffuse dilatation of the colon and small bowel loops without definite transition point. Findings may be  related to ileus. 2. Very large amount of stool  throughout the colon. 3. Large air-fluid level in the stomach. 4. Soft tissue density in the high right inguinal region may be related to high riding testicle. Other etiologies are not excluded. Correlate with physical exam. Electronically Signed   By: Darliss Cheney M.D.   On: 12/11/2022 20:20   CT Head Wo Contrast  Result Date: 12/14/2022 CLINICAL DATA:  Mental status change, unknown cause. EXAM: CT HEAD WITHOUT CONTRAST TECHNIQUE: Contiguous axial images were obtained from the base of the skull through the vertex without intravenous contrast. RADIATION DOSE REDUCTION: This exam was performed according to the departmental dose-optimization program which includes automated exposure control, adjustment of the mA and/or kV according to patient size and/or use of iterative reconstruction technique. COMPARISON:  None Available. FINDINGS: Brain: No acute hemorrhage, midline shift or mass effect. No extra-axial fluid collection. Diffuse atrophy is noted. Mild periventricular white matter hypodensities are noted bilaterally. No hydrocephalus. Vascular: No hyperdense vessel or unexpected calcification. Skull: No acute fracture. Sinuses/Orbits: No acute finding. Other: None. IMPRESSION: 1. No acute intracranial process. 2. Atrophy with chronic microvascular ischemic changes. Electronically Signed   By: Thornell Sartorius M.D.   On: 12/16/2022 20:49   DG Chest 1 View  Result Date: 11/25/2022 CLINICAL DATA:  Altered mental status EXAM: CHEST  1 VIEW COMPARISON:  08/16/2022 FINDINGS: Shallow inspiration. Heart size and pulmonary vascularity are normal. Lungs are clear. No pleural effusions. No pneumothorax. Mediastinal contours appear intact. IMPRESSION: No active disease. Electronically Signed   By: Burman Nieves M.D.   On: 11/29/2022 20:48    Microbiology No results found for this or any previous visit (from the past 240 hour(s)).  Lab Basic Metabolic Panel:     Latest Ref Rng & Units 12/27/22    5:44 AM 12/14/2022    5:53 AM 12/13/2022    4:51 AM  BMP  Glucose 70 - 99 mg/dL 161  98  82   BUN 8 - 23 mg/dL 32  19  19   Creatinine 0.61 - 1.24 mg/dL 0.96  0.45  4.09   Sodium 135 - 145 mmol/L 139  138  136   Potassium 3.5 - 5.1 mmol/L 4.5  3.7  4.1   Chloride 98 - 111 mmol/L 109  111  105   CO2 22 - 32 mmol/L 21  19  22    Calcium 8.9 - 10.3 mg/dL 9.3  9.1  9.1     CBC: CBC    Component Value Date/Time   WBC 10.1 12-27-22 0639   RBC 2.97 (L) 2022/12/27 0639   HGB 8.0 (L) 27-Dec-2022 0639   HCT 24.7 (L) 12-27-22 0639   PLT 160 27-Dec-2022 0639   MCV 83.2 2022/12/27 0639   MCH 26.9 27-Dec-2022 0639   MCHC 32.4 2022/12/27 0639   RDW 14.8 2022-12-27 0639   LYMPHSABS 1.4 12/17/2022 2226   MONOABS 0.8 12/11/2022 2226   EOSABS 0.1 12/22/2022 2226   BASOSABS 0.0 11/23/2022 2226   Sepsis Labs: Lactic Acid, Venous    Component Value Date/Time   LATICACIDVEN 8.5 (HH) 12/27/2022 1850     Procedures/Operations  Intubation CVC placement   Steffanie Dunn 12/27/2022, 7:11 AM

## 2023-06-14 ENCOUNTER — Ambulatory Visit: Payer: Medicaid Other | Admitting: Neurology
